# Patient Record
Sex: Male | Born: 1945 | Race: Black or African American | Hispanic: No | Marital: Married | Smoking: Never smoker
Health system: Southern US, Community
[De-identification: ages and names within clinical notes are randomized; demographics above are authoritative.]

## PROBLEM LIST (undated history)

## (undated) DIAGNOSIS — N183 Chronic kidney disease, stage 3 unspecified: Secondary | ICD-10-CM

## (undated) DIAGNOSIS — E559 Vitamin D deficiency, unspecified: Secondary | ICD-10-CM

## (undated) DIAGNOSIS — I4891 Unspecified atrial fibrillation: Secondary | ICD-10-CM

## (undated) DIAGNOSIS — F32A Depression, unspecified: Secondary | ICD-10-CM

## (undated) DIAGNOSIS — M1990 Unspecified osteoarthritis, unspecified site: Secondary | ICD-10-CM

## (undated) DIAGNOSIS — E669 Obesity, unspecified: Secondary | ICD-10-CM

## (undated) DIAGNOSIS — E785 Hyperlipidemia, unspecified: Secondary | ICD-10-CM

## (undated) DIAGNOSIS — D649 Anemia, unspecified: Secondary | ICD-10-CM

## (undated) DIAGNOSIS — I2510 Atherosclerotic heart disease of native coronary artery without angina pectoris: Secondary | ICD-10-CM

## (undated) DIAGNOSIS — I10 Essential (primary) hypertension: Secondary | ICD-10-CM

## (undated) DIAGNOSIS — D509 Iron deficiency anemia, unspecified: Secondary | ICD-10-CM

## (undated) DIAGNOSIS — N400 Enlarged prostate without lower urinary tract symptoms: Secondary | ICD-10-CM

## (undated) DIAGNOSIS — J189 Pneumonia, unspecified organism: Secondary | ICD-10-CM

## (undated) DIAGNOSIS — R911 Solitary pulmonary nodule: Secondary | ICD-10-CM

## (undated) DIAGNOSIS — I714 Abdominal aortic aneurysm, without rupture, unspecified: Secondary | ICD-10-CM

## (undated) DIAGNOSIS — K635 Polyp of colon: Secondary | ICD-10-CM

## (undated) DIAGNOSIS — K219 Gastro-esophageal reflux disease without esophagitis: Secondary | ICD-10-CM

## (undated) DIAGNOSIS — J869 Pyothorax without fistula: Secondary | ICD-10-CM

## (undated) DIAGNOSIS — F1411 Cocaine abuse, in remission: Secondary | ICD-10-CM

## (undated) DIAGNOSIS — I4510 Unspecified right bundle-branch block: Secondary | ICD-10-CM

## (undated) DIAGNOSIS — G4730 Sleep apnea, unspecified: Secondary | ICD-10-CM

## (undated) DIAGNOSIS — R51 Headache: Secondary | ICD-10-CM

## (undated) DIAGNOSIS — N492 Inflammatory disorders of scrotum: Secondary | ICD-10-CM

## (undated) DIAGNOSIS — K5120 Ulcerative (chronic) proctitis without complications: Secondary | ICD-10-CM

## (undated) DIAGNOSIS — C3490 Malignant neoplasm of unspecified part of unspecified bronchus or lung: Secondary | ICD-10-CM

## (undated) DIAGNOSIS — I219 Acute myocardial infarction, unspecified: Secondary | ICD-10-CM

## (undated) DIAGNOSIS — I471 Supraventricular tachycardia: Secondary | ICD-10-CM

## (undated) DIAGNOSIS — I444 Left anterior fascicular block: Secondary | ICD-10-CM

## (undated) DIAGNOSIS — K760 Fatty (change of) liver, not elsewhere classified: Secondary | ICD-10-CM

## (undated) DIAGNOSIS — R296 Repeated falls: Secondary | ICD-10-CM

## (undated) DIAGNOSIS — Z972 Presence of dental prosthetic device (complete) (partial): Secondary | ICD-10-CM

## (undated) DIAGNOSIS — F329 Major depressive disorder, single episode, unspecified: Secondary | ICD-10-CM

## (undated) DIAGNOSIS — K5909 Other constipation: Secondary | ICD-10-CM

## (undated) DIAGNOSIS — E039 Hypothyroidism, unspecified: Secondary | ICD-10-CM

## (undated) DIAGNOSIS — F0781 Postconcussional syndrome: Secondary | ICD-10-CM

## (undated) HISTORY — DX: Supraventricular tachycardia: I47.1

## (undated) HISTORY — PX: MULTIPLE TOOTH EXTRACTIONS: SHX2053

## (undated) HISTORY — DX: Solitary pulmonary nodule: R91.1

## (undated) HISTORY — DX: Pyothorax without fistula: J86.9

## (undated) HISTORY — PX: OTHER SURGICAL HISTORY: SHX169

## (undated) HISTORY — PX: CARDIAC SURGERY: SHX584

## (undated) HISTORY — DX: Hyperlipidemia, unspecified: E78.5

## (undated) HISTORY — DX: Benign prostatic hyperplasia without lower urinary tract symptoms: N40.0

## (undated) HISTORY — PX: HERNIA REPAIR: SHX51

## (undated) HISTORY — DX: Iron deficiency anemia, unspecified: D50.9

## (undated) HISTORY — PX: HEMORRHOID SURGERY: SHX153

## (undated) HISTORY — DX: Inflammatory disorders of scrotum: N49.2

## (undated) HISTORY — DX: Atherosclerotic heart disease of native coronary artery without angina pectoris: I25.10

## (undated) HISTORY — DX: Cocaine abuse, in remission: F14.11

## (undated) HISTORY — PX: COLONOSCOPY W/ BIOPSIES AND POLYPECTOMY: SHX1376

## (undated) HISTORY — DX: Hypothyroidism, unspecified: E03.9

## (undated) HISTORY — DX: Vitamin D deficiency, unspecified: E55.9

## (undated) HISTORY — DX: Fatty (change of) liver, not elsewhere classified: K76.0

## (undated) HISTORY — PX: TOOTH EXTRACTION: SUR596

---

## 1998-11-21 ENCOUNTER — Encounter: Payer: Self-pay | Admitting: Cardiology

## 1998-11-21 ENCOUNTER — Inpatient Hospital Stay (HOSPITAL_COMMUNITY): Admission: EM | Admit: 1998-11-21 | Discharge: 1998-11-24 | Payer: Self-pay | Admitting: Emergency Medicine

## 2000-08-24 ENCOUNTER — Emergency Department (HOSPITAL_COMMUNITY): Admission: EM | Admit: 2000-08-24 | Discharge: 2000-08-24 | Payer: Self-pay | Admitting: Emergency Medicine

## 2000-08-27 ENCOUNTER — Encounter: Payer: Self-pay | Admitting: Urology

## 2000-08-27 ENCOUNTER — Ambulatory Visit (HOSPITAL_COMMUNITY): Admission: RE | Admit: 2000-08-27 | Discharge: 2000-08-27 | Payer: Self-pay | Admitting: Urology

## 2000-09-03 ENCOUNTER — Ambulatory Visit (HOSPITAL_COMMUNITY): Admission: RE | Admit: 2000-09-03 | Discharge: 2000-09-03 | Payer: Self-pay | Admitting: *Deleted

## 2000-09-03 ENCOUNTER — Encounter: Payer: Self-pay | Admitting: *Deleted

## 2000-09-10 ENCOUNTER — Encounter: Payer: Self-pay | Admitting: Urology

## 2000-09-17 ENCOUNTER — Inpatient Hospital Stay (HOSPITAL_COMMUNITY): Admission: RE | Admit: 2000-09-17 | Discharge: 2000-09-21 | Payer: Self-pay | Admitting: Urology

## 2000-09-17 ENCOUNTER — Encounter (INDEPENDENT_AMBULATORY_CARE_PROVIDER_SITE_OTHER): Payer: Self-pay | Admitting: Specialist

## 2000-12-03 ENCOUNTER — Encounter: Payer: Self-pay | Admitting: *Deleted

## 2000-12-03 ENCOUNTER — Ambulatory Visit (HOSPITAL_COMMUNITY): Admission: RE | Admit: 2000-12-03 | Discharge: 2000-12-03 | Payer: Self-pay | Admitting: *Deleted

## 2000-12-09 ENCOUNTER — Ambulatory Visit: Admission: RE | Admit: 2000-12-09 | Discharge: 2000-12-09 | Payer: Self-pay | Admitting: *Deleted

## 2000-12-23 ENCOUNTER — Encounter: Payer: Self-pay | Admitting: *Deleted

## 2000-12-23 ENCOUNTER — Ambulatory Visit (HOSPITAL_COMMUNITY): Admission: RE | Admit: 2000-12-23 | Discharge: 2000-12-23 | Payer: Self-pay | Admitting: *Deleted

## 2001-06-27 ENCOUNTER — Encounter: Payer: Self-pay | Admitting: *Deleted

## 2001-06-27 ENCOUNTER — Ambulatory Visit (HOSPITAL_COMMUNITY): Admission: RE | Admit: 2001-06-27 | Discharge: 2001-06-27 | Payer: Self-pay | Admitting: *Deleted

## 2004-08-31 ENCOUNTER — Emergency Department (HOSPITAL_COMMUNITY): Admission: EM | Admit: 2004-08-31 | Discharge: 2004-09-01 | Payer: Self-pay | Admitting: *Deleted

## 2007-08-20 ENCOUNTER — Emergency Department (HOSPITAL_COMMUNITY): Admission: EM | Admit: 2007-08-20 | Discharge: 2007-08-20 | Payer: Self-pay | Admitting: Emergency Medicine

## 2009-01-13 ENCOUNTER — Encounter: Admission: RE | Admit: 2009-01-13 | Discharge: 2009-01-13 | Payer: Self-pay | Admitting: Family Medicine

## 2009-02-15 ENCOUNTER — Encounter: Admission: RE | Admit: 2009-02-15 | Discharge: 2009-05-16 | Payer: Self-pay | Admitting: Family Medicine

## 2009-05-11 ENCOUNTER — Ambulatory Visit: Payer: Self-pay | Admitting: Thoracic Surgery (Cardiothoracic Vascular Surgery)

## 2009-05-11 ENCOUNTER — Inpatient Hospital Stay (HOSPITAL_COMMUNITY): Admission: RE | Admit: 2009-05-11 | Discharge: 2009-05-12 | Payer: Self-pay | Admitting: Interventional Cardiology

## 2009-05-12 ENCOUNTER — Encounter: Payer: Self-pay | Admitting: Cardiothoracic Surgery

## 2009-05-13 ENCOUNTER — Inpatient Hospital Stay (HOSPITAL_COMMUNITY)
Admission: AD | Admit: 2009-05-13 | Discharge: 2009-05-19 | Payer: Self-pay | Admitting: Thoracic Surgery (Cardiothoracic Vascular Surgery)

## 2009-05-13 HISTORY — PX: OTHER SURGICAL HISTORY: SHX169

## 2009-06-06 ENCOUNTER — Ambulatory Visit: Payer: Self-pay | Admitting: Thoracic Surgery (Cardiothoracic Vascular Surgery)

## 2009-06-06 ENCOUNTER — Encounter
Admission: RE | Admit: 2009-06-06 | Discharge: 2009-06-06 | Payer: Self-pay | Admitting: Thoracic Surgery (Cardiothoracic Vascular Surgery)

## 2009-06-24 ENCOUNTER — Ambulatory Visit: Payer: Self-pay | Admitting: Thoracic Surgery (Cardiothoracic Vascular Surgery)

## 2009-09-01 ENCOUNTER — Encounter: Admission: RE | Admit: 2009-09-01 | Discharge: 2009-09-01 | Payer: Self-pay | Admitting: Family Medicine

## 2009-11-16 ENCOUNTER — Emergency Department (HOSPITAL_COMMUNITY): Admission: EM | Admit: 2009-11-16 | Discharge: 2009-11-16 | Payer: Self-pay | Admitting: Emergency Medicine

## 2010-06-24 ENCOUNTER — Inpatient Hospital Stay (HOSPITAL_COMMUNITY)
Admission: EM | Admit: 2010-06-24 | Discharge: 2010-07-04 | Payer: Self-pay | Source: Home / Self Care | Attending: Internal Medicine | Admitting: Internal Medicine

## 2010-06-25 ENCOUNTER — Encounter (INDEPENDENT_AMBULATORY_CARE_PROVIDER_SITE_OTHER): Payer: Self-pay | Admitting: Pulmonary Disease

## 2010-06-26 LAB — TYPE AND SCREEN
ABO/RH(D): A POS
Antibody Screen: NEGATIVE

## 2010-06-26 LAB — URINALYSIS, ROUTINE W REFLEX MICROSCOPIC
Bilirubin Urine: NEGATIVE
Hgb urine dipstick: NEGATIVE
Ketones, ur: NEGATIVE mg/dL
Leukocytes, UA: NEGATIVE
Nitrite: NEGATIVE
Protein, ur: 30 mg/dL — AB
Specific Gravity, Urine: 1.034 — ABNORMAL HIGH (ref 1.005–1.030)
Urine Glucose, Fasting: >1000 mg/dL — AB
Urobilinogen, UA: 1 mg/dL (ref 0.0–1.0)
pH: 5.5 (ref 5.0–8.0)

## 2010-06-26 LAB — GLUCOSE, CAPILLARY
Glucose-Capillary: 428 mg/dL — ABNORMAL HIGH (ref 70–99)
Glucose-Capillary: 104 mg/dL — ABNORMAL HIGH (ref 70–99)
Glucose-Capillary: 112 mg/dL — ABNORMAL HIGH (ref 70–99)
Glucose-Capillary: 118 mg/dL — ABNORMAL HIGH (ref 70–99)
Glucose-Capillary: 126 mg/dL — ABNORMAL HIGH (ref 70–99)
Glucose-Capillary: 126 mg/dL — ABNORMAL HIGH (ref 70–99)
Glucose-Capillary: 132 mg/dL — ABNORMAL HIGH (ref 70–99)
Glucose-Capillary: 135 mg/dL — ABNORMAL HIGH (ref 70–99)
Glucose-Capillary: 198 mg/dL — ABNORMAL HIGH (ref 70–99)
Glucose-Capillary: 250 mg/dL — ABNORMAL HIGH (ref 70–99)
Glucose-Capillary: 275 mg/dL — ABNORMAL HIGH (ref 70–99)
Glucose-Capillary: 311 mg/dL — ABNORMAL HIGH (ref 70–99)
Glucose-Capillary: 337 mg/dL — ABNORMAL HIGH (ref 70–99)

## 2010-06-26 LAB — CBC
HCT: 43.1 % (ref 39.0–52.0)
HCT: 38.8 % — ABNORMAL LOW (ref 39.0–52.0)
Hemoglobin: 15.1 g/dL (ref 13.0–17.0)
Hemoglobin: 13.5 g/dL (ref 13.0–17.0)
MCH: 31.8 pg (ref 26.0–34.0)
MCH: 31.5 pg (ref 26.0–34.0)
MCHC: 35 g/dL (ref 30.0–36.0)
MCHC: 34.8 g/dL (ref 30.0–36.0)
MCV: 90.7 fL (ref 78.0–100.0)
MCV: 90.4 fL (ref 78.0–100.0)
Platelets: 260 10*3/uL (ref 150–400)
Platelets: 226 10*3/uL (ref 150–400)
RBC: 4.75 MIL/uL (ref 4.22–5.81)
RBC: 4.29 MIL/uL (ref 4.22–5.81)
RDW: 13.2 % (ref 11.5–15.5)
RDW: 13.2 % (ref 11.5–15.5)
WBC: 15.4 10*3/uL — ABNORMAL HIGH (ref 4.0–10.5)
WBC: 16.5 10*3/uL — ABNORMAL HIGH (ref 4.0–10.5)

## 2010-06-26 LAB — COMPREHENSIVE METABOLIC PANEL
ALT: 27 U/L (ref 0–53)
AST: 16 U/L (ref 0–37)
Albumin: 2.9 g/dL — ABNORMAL LOW (ref 3.5–5.2)
Alkaline Phosphatase: 44 U/L (ref 39–117)
BUN: 33 mg/dL — ABNORMAL HIGH (ref 6–23)
CO2: 25 mEq/L (ref 19–32)
Calcium: 8.6 mg/dL (ref 8.4–10.5)
Chloride: 95 mEq/L — ABNORMAL LOW (ref 96–112)
Creatinine, Ser: 2.14 mg/dL — ABNORMAL HIGH (ref 0.4–1.5)
GFR calc Af Amer: 38 mL/min — ABNORMAL LOW (ref >60)
GFR calc non Af Amer: 31 mL/min — ABNORMAL LOW (ref >60)
Glucose, Bld: 100 mg/dL — ABNORMAL HIGH (ref 70–99)
Potassium: 3.9 mEq/L (ref 3.5–5.1)
Sodium: 130 mEq/L — ABNORMAL LOW (ref 135–145)
Total Bilirubin: 1.3 mg/dL — ABNORMAL HIGH (ref 0.3–1.2)
Total Protein: 6.9 g/dL (ref 6.0–8.3)

## 2010-06-26 LAB — MRSA PCR SCREENING: MRSA by PCR: NEGATIVE

## 2010-06-26 LAB — DIFFERENTIAL
Basophils Absolute: 0 10*3/uL (ref 0.0–0.1)
Basophils Absolute: 0 10*3/uL (ref 0.0–0.1)
Basophils Relative: 0 % (ref 0–1)
Basophils Relative: 0 % (ref 0–1)
Eosinophils Absolute: 0 10*3/uL (ref 0.0–0.7)
Eosinophils Absolute: 0 10*3/uL (ref 0.0–0.7)
Eosinophils Relative: 0 % (ref 0–5)
Eosinophils Relative: 0 % (ref 0–5)
Lymphocytes Relative: 9 % — ABNORMAL LOW (ref 12–46)
Lymphocytes Relative: 11 % — ABNORMAL LOW (ref 12–46)
Lymphs Abs: 1.4 10*3/uL (ref 0.7–4.0)
Lymphs Abs: 1.8 10*3/uL (ref 0.7–4.0)
Monocytes Absolute: 1.3 10*3/uL — ABNORMAL HIGH (ref 0.1–1.0)
Monocytes Absolute: 2 10*3/uL — ABNORMAL HIGH (ref 0.1–1.0)
Monocytes Relative: 9 % (ref 3–12)
Monocytes Relative: 12 % (ref 3–12)
Neutro Abs: 12.7 10*3/uL — ABNORMAL HIGH (ref 1.7–7.7)
Neutro Abs: 12.7 10*3/uL — ABNORMAL HIGH (ref 1.7–7.7)
Neutrophils Relative %: 82 % — ABNORMAL HIGH (ref 43–77)
Neutrophils Relative %: 77 % (ref 43–77)

## 2010-06-26 LAB — LIPID PANEL
Cholesterol: 132 mg/dL (ref 0–200)
HDL: 22 mg/dL — ABNORMAL LOW (ref >39)
LDL Cholesterol: 87 mg/dL (ref 0–99)
Total CHOL/HDL Ratio: 6 RATIO
Triglycerides: 116 mg/dL (ref <150)
VLDL: 23 mg/dL (ref 0–40)

## 2010-06-26 LAB — GRAM STAIN

## 2010-06-26 LAB — URINE MICROSCOPIC-ADD ON

## 2010-06-26 LAB — CARDIAC PANEL(CRET KIN+CKTOT+MB+TROPI)
CK, MB: 0.6 ng/mL (ref 0.3–4.0)
CK, MB: 1 ng/mL (ref 0.3–4.0)
Relative Index: INVALID (ref 0.0–2.5)
Relative Index: INVALID (ref 0.0–2.5)
Total CK: 35 U/L (ref 7–232)
Total CK: 38 U/L (ref 7–232)
Troponin I: 0.03 ng/mL (ref 0.00–0.06)
Troponin I: 0.03 ng/mL (ref 0.00–0.06)

## 2010-06-26 LAB — PHOSPHORUS: Phosphorus: 2 mg/dL — ABNORMAL LOW (ref 2.3–4.6)

## 2010-06-26 LAB — CORTISOL: Cortisol, Plasma: 42.3 ug/dL

## 2010-06-26 LAB — PROTIME-INR
INR: 1.27 (ref 0.00–1.49)
Prothrombin Time: 16.1 seconds — ABNORMAL HIGH (ref 11.6–15.2)

## 2010-06-26 LAB — PROCALCITONIN: Procalcitonin: 6.89 ng/mL

## 2010-06-26 LAB — HEMOGLOBIN A1C
Hgb A1c MFr Bld: 11.1 % — ABNORMAL HIGH (ref <5.7)
Mean Plasma Glucose: 272 mg/dL — ABNORMAL HIGH (ref <117)

## 2010-06-26 LAB — MAGNESIUM: Magnesium: 1.6 mg/dL (ref 1.5–2.5)

## 2010-06-26 LAB — TSH: TSH: 3.043 u[IU]/mL (ref 0.350–4.500)

## 2010-06-26 LAB — ABO/RH: ABO/RH(D): A POS

## 2010-06-26 LAB — LACTIC ACID, PLASMA: Lactic Acid, Venous: 3.5 mmol/L — ABNORMAL HIGH (ref 0.5–2.2)

## 2010-06-26 LAB — APTT: aPTT: 81 seconds — ABNORMAL HIGH (ref 24–37)

## 2010-06-28 LAB — COMPREHENSIVE METABOLIC PANEL
ALT: 17 U/L (ref 0–53)
AST: 17 U/L (ref 0–37)
Albumin: 2.1 g/dL — ABNORMAL LOW (ref 3.5–5.2)
Alkaline Phosphatase: 34 U/L — ABNORMAL LOW (ref 39–117)
BUN: 35 mg/dL — ABNORMAL HIGH (ref 6–23)
CO2: 20 mEq/L (ref 19–32)
Calcium: 7.5 mg/dL — ABNORMAL LOW (ref 8.4–10.5)
Chloride: 105 mEq/L (ref 96–112)
Creatinine, Ser: 1.72 mg/dL — ABNORMAL HIGH (ref 0.4–1.5)
GFR calc Af Amer: 48 mL/min — ABNORMAL LOW (ref >60)
GFR calc non Af Amer: 40 mL/min — ABNORMAL LOW (ref >60)
Glucose, Bld: 235 mg/dL — ABNORMAL HIGH (ref 70–99)
Potassium: 4.2 mEq/L (ref 3.5–5.1)
Sodium: 133 mEq/L — ABNORMAL LOW (ref 135–145)
Total Bilirubin: 0.8 mg/dL (ref 0.3–1.2)
Total Protein: 5.7 g/dL — ABNORMAL LOW (ref 6.0–8.3)

## 2010-06-28 LAB — CBC
HCT: 32.5 % — ABNORMAL LOW (ref 39.0–52.0)
HCT: 33.3 % — ABNORMAL LOW (ref 39.0–52.0)
Hemoglobin: 10.9 g/dL — ABNORMAL LOW (ref 13.0–17.0)
Hemoglobin: 11.1 g/dL — ABNORMAL LOW (ref 13.0–17.0)
MCH: 30.4 pg (ref 26.0–34.0)
MCH: 30.5 pg (ref 26.0–34.0)
MCHC: 33.5 g/dL (ref 30.0–36.0)
MCHC: 33.3 g/dL (ref 30.0–36.0)
MCV: 90.5 fL (ref 78.0–100.0)
MCV: 91.5 fL (ref 78.0–100.0)
Platelets: 140 10*3/uL — ABNORMAL LOW (ref 150–400)
Platelets: 197 10*3/uL (ref 150–400)
RBC: 3.59 MIL/uL — ABNORMAL LOW (ref 4.22–5.81)
RBC: 3.64 MIL/uL — ABNORMAL LOW (ref 4.22–5.81)
RDW: 13.3 % (ref 11.5–15.5)
RDW: 13.5 % (ref 11.5–15.5)
WBC: 7.6 10*3/uL (ref 4.0–10.5)
WBC: 9.6 10*3/uL (ref 4.0–10.5)

## 2010-06-28 LAB — GLUCOSE, CAPILLARY
Glucose-Capillary: 405 mg/dL — ABNORMAL HIGH (ref 70–99)
Glucose-Capillary: 380 mg/dL — ABNORMAL HIGH (ref 70–99)
Glucose-Capillary: 290 mg/dL — ABNORMAL HIGH (ref 70–99)
Glucose-Capillary: 205 mg/dL — ABNORMAL HIGH (ref 70–99)
Glucose-Capillary: 292 mg/dL — ABNORMAL HIGH (ref 70–99)
Glucose-Capillary: 302 mg/dL — ABNORMAL HIGH (ref 70–99)
Glucose-Capillary: 276 mg/dL — ABNORMAL HIGH (ref 70–99)
Glucose-Capillary: 169 mg/dL — ABNORMAL HIGH (ref 70–99)
Glucose-Capillary: 160 mg/dL — ABNORMAL HIGH (ref 70–99)
Glucose-Capillary: 125 mg/dL — ABNORMAL HIGH (ref 70–99)
Glucose-Capillary: 118 mg/dL — ABNORMAL HIGH (ref 70–99)

## 2010-06-28 LAB — BASIC METABOLIC PANEL
BUN: 33 mg/dL — ABNORMAL HIGH (ref 6–23)
CO2: 21 mEq/L (ref 19–32)
Calcium: 7.5 mg/dL — ABNORMAL LOW (ref 8.4–10.5)
Chloride: 107 mEq/L (ref 96–112)
Creatinine, Ser: 1.62 mg/dL — ABNORMAL HIGH (ref 0.4–1.5)
GFR calc Af Amer: 52 mL/min — ABNORMAL LOW (ref >60)
GFR calc non Af Amer: 43 mL/min — ABNORMAL LOW (ref >60)
Glucose, Bld: 215 mg/dL — ABNORMAL HIGH (ref 70–99)
Potassium: 3.9 mEq/L (ref 3.5–5.1)
Sodium: 135 mEq/L (ref 135–145)

## 2010-06-28 LAB — PHOSPHORUS: Phosphorus: 2.5 mg/dL (ref 2.3–4.6)

## 2010-06-28 LAB — MAGNESIUM: Magnesium: 1.9 mg/dL (ref 1.5–2.5)

## 2010-06-28 LAB — CULTURE, ROUTINE-ABSCESS

## 2010-06-30 NOTE — Consult Note (Signed)
NAME:  Matthew [name], Matthew [name] NO.:  [id number]  MEDICAL RECORD NO.:  [id number]          PATIENT TYPE:  INP  LOCATION:  [place]                         FACILITY:  [facility]  PHYSICIAN:  Marland Kitchen T. Hoxworth, M.D.DATE OF BIRTH:  [birth year]  DATE OF CONSULTATION:  06/25/2010 DATE OF DISCHARGE:                                CONSULTATION   CHIEF COMPLAINT:  Perineal and scrotal pain and swelling.  HISTORY OF PRESENT ILLNESS:  [name] is a 65 year old male who presented to [facility] yesterday complaining of about 6 days of increasing pain, swelling and tenderness in his perineum and scrotum. He states he initially noted a small "pimple" which he localizes in the perineum anterior and to the right of the anus.  He says this has progressively worsened.  The patient was admitted yesterday and started on broad-spectrum antibiotics.  He was evaluated yesterday by Urology and there was no definite evidence of fluctuance or necrotic tissue. Overnight, the patient states that the pain and tenderness has gotten no better.  This morning, he developed some tachycardia and hypotension and his white count is further elevated.  We are asked to see the patient in consultation this morning.  PAST MEDICAL HISTORY/PAST SURGICAL HISTORY:  Significant for history of renal cell carcinoma status post nephrectomy.  He had lung metastases and was treated with interleukin about 8 years ago with complete resolution.  He has had a coronary artery bypass graft a year ago.  He was followed by Dr. Irish Lack.  He has had cataract surgery.  Medically, he has a history of adult-onset diabetes mellitus, coronary artery disease, atrial fibrillation, depression, anxiety, a questionable history of ulcerative colitis, anemia, hiatal hernia.  MEDICATIONS ON ADMISSION:  Aspirin 325 daily, nitroglycerin p.r.n., Crestor 20 daily, iron, Januvia, 50 daily, Levemir, metoprolol 25 three times a day, fish  oil, MiraLax, levothyroxine 100 mcg daily, amiodarone 200 daily, Wellbutrin 300 daily, Fexmid 7.5 three times a day, omeprazole 40 twice daily.  ALLERGIES:  None.  SOCIAL HISTORY:  Married.  Lives with his wife.  Denies cigarettes or alcohol.  FAMILY HISTORY:  Noncontributory.  REVIEW OF SYSTEMS:  GENERAL:  Denies fever or chills with this illness. RESPIRATORY:  Denies shortness of breath, cough, wheezing.  CARDIAC: Denies chest pain, palpitations.  ABDOMEN/GI:  Denies abdominal pain, nausea, vomiting.  He has not had a bowel movement in several days.  GU: No urinary burning, frequency.  PHYSICAL EXAMINATION:  VITAL SIGNS:  This morning in ICU, blood pressure dropped to 86/40, heart rate up to 128, and respirations 20.  He is afebrile.  After fluid boluses, blood pressure back up to 116/50. GENERAL:  He is alert, conversant, oriented male in no severe distress. SKIN:  Generally warm and dry. HEENT:  No mass or thyromegaly.  Sclerae nonicteric.  Oropharynx clear. LYMPH NODES:  No cervical, supraclavicular, inguinal nodes palpable. LUNGS:  Clear without wheezing, increase work of breathing. CARDIAC:  Healed sternotomy.  Regular tachycardia.  No murmurs. ABDOMEN:  Soft, nontender.  No masses or organomegaly. PERINEUM/RECTUM AND GU:  There is fairly marked edema, erythema and swelling of the scrotum.  This extends down  into the perineum all anterior to the rectum.  In the perineum anterior to the right of the rectum is about 1-cm area of fluctuance and pustule without drainage. On the posterior left scrotum is an area of some skin discoloration and questionable fluctuance.  LABORATORY DATA:  This morning, white count was 16.5 which is up from about 15 yesterday, hemoglobin 13.5.  Electrolytes abnormal for sodium of 130.  BUN and creatinine are elevated today at 33 and 2.14. Bilirubin is 1.3, albumin 2.9.  Remainder of LFTs are normal.  CT scan of the abdomen and pelvis personally  reviewed.  This revealed marked edema and stranding of the scrotum and perineum  yesterday without discrete abscess or soft tissue air identified, although concern was raised for possible Fournier gangrene  ASSESSMENT AND PLAN:  The patient is a 65 year old male with multiple medical problems who has evidence of worsening soft tissue infection of the perineum and scrotum consistent with necrotizing soft tissue infection, i.e. Fournier gangrene.  Now today, he has evidence of early sepsis with hypotension and organ dysfunction.  I believe the patient will need emergency incision, drainage and debridement of the perineum and scrotum.  Urology has been consulted.  Dr. Alinda Money saw the patient last night and this morning and feels that his physical exam is somewhat worse.  He will be involved in the operating room as well.  I have discuss this with the patient and wife including significant risks of anesthetic large open wound and prolonged hospital course.     Darene Lamer. Hoxworth, M.D.     Alto Denver  D:  06/25/2010  T:  06/25/2010  Job:  [id number]  Electronically Signed by Excell Seltzer M.D. on 06/30/2010 09:19:54 AM

## 2010-06-30 NOTE — Op Note (Signed)
NAME:  Matthew [name], Matthew [name] NO.:  [id number]  MEDICAL RECORD NO.:  [id number]          PATIENT TYPE:  INP  LOCATION:  [place]                         FACILITY:  [facility]  PHYSICIAN:  Marland Kitchen T. Hoxworth, M.D.DATE OF BIRTH:  1945-07-13  DATE OF PROCEDURE:  06/25/2010 DATE OF DISCHARGE:                              OPERATIVE REPORT   PREOPERATIVE DIAGNOSIS:  Perineal abscess with early necrotizing infection (Fournier's gangrene).  POSTOPERATIVE DIAGNOSIS:  Perineal abscess with early necrotizing infection (Fournier's gangrene).  SURGICAL PROCEDURES:  Incision, drainage and debridement of perineum and scrotum.  CO-SURGEONS: 1. Darene Lamer. Hoxworth, M.D. 2. Raynelle Bring, M.D.  ANESTHESIA:  General.  BRIEF HISTORY:  Patient is a 65 year old male with multiple medical problems including diabetes and previous history of metastatic renal cancer resolved after interleukin treatment.  He has multiple other medical problems.  He presents with a several-day history of increasing redness, swelling and pain at the perineum and scrotum, which apparently began per his history as a small pustule in the perineum.  After several hours of hospitalization, he has definitely worsened with some signs of early sepsis.  On examination this morning, he has area of fluctuance and small pustule formation in the perineum just anterior to the rectum, which does not appear to communicate with the rectum by physical exam or CT and also redness and swelling of the scrotum with probably some early skin necrosis of the posterior scrotum.  With these findings in conjunction with Dr. Alinda Money from urology, we have recommended proceeding to the operating room for incision, drainage and debridement.  The nature of the procedure, its indications, risks of bleeding, infection, anesthetic problems, possible need for further surgery and delayed wound healing were discussed with the patient and his wife  preoperatively.  DESCRIPTION OF OPERATION:  The patient was brought to the operating room and placed in supine position on the operating table and general endotracheal anesthesia was induced.  He was already on broad-spectrum antibiotics.  He was carefully placed in lithotomy position.  The perineum was widely sterilely prepped and draped.  Correct patient and procedure were verified.  Initially, I excised about a 2 x 1 cm area of obviously infected skin in the perineum near the midline just to the right anterior to the anus.  This entered a deep subcutaneous pocket with obvious foul-smelling pus.  Cultures were obtained.  This pocket tracked anteriorly up toward the perineal body and the skin and subcutaneous tissue were more widely opened toward the base of the scrotum.  There was necrotic superficial fascia in the wound that was debrided down to healthy muscle over the perineal body.  This resulted in the cavity of about 5 x 3 x 2 cm.  At this point all the edges of the wound were healthy.  Bleeding tissue and the base was healthy appearing muscle.  Following this, Dr. Alinda Money excised the obviously and the necrotic area of skin overlying the scrotum back to healthy tissue as well.  Hemostasis was obtained with cautery.  The wounds were packed with moist saline gauze.  The patient was taken to recovery room in stable condition.  Darene Lamer. Hoxworth, M.D.     Alto Denver  D:  06/25/2010  T:  06/25/2010  Job:  [id number]  Electronically Signed by Excell Seltzer M.D. on 06/30/2010 09:19:57 AM

## 2010-07-02 ENCOUNTER — Encounter: Payer: Self-pay | Admitting: Thoracic Surgery (Cardiothoracic Vascular Surgery)

## 2010-07-02 NOTE — Op Note (Addendum)
  NAME:  Matthew [name], FRIEDT NO.:  [id number]  MEDICAL RECORD NO.:  [id number]          PATIENT TYPE:  INP  LOCATION:  9622                         FACILITY:  [facility]  PHYSICIAN:  Raynelle Bring, MD      DATE OF BIRTH:  10-11-1945  DATE OF PROCEDURE:  06/25/2010 DATE OF DISCHARGE:                              OPERATIVE REPORT   PREOPERATIVE DIAGNOSIS:  Scrotal/perineal cellulitis with possible early Fournier's gangrene.  POSTOPERATIVE DIAGNOSIS:  Perineal/scrotal abscess and cellulitis.  SURGEONS: 1. Darene Lamer. Hoxworth, M.D. 2. Raynelle Bring, M.D.  DESCRIPTION OF PROCEDURE:  For full details, please see the dictated note by Dr. Excell Seltzer.  I had evaluated [name] last night and had discussed his case further with Dr. Excell Seltzer this morning after re- evaluating him.  He did have progression of his infection on his evaluation this morning with clear fluctuant areas in the perineum and scrotum posteriorly.  In addition, he became hemodynamically unstable this morning with drop in his systolic blood pressure into the 80s and requiring fluid resuscitation.  He was taken to the operating room by Dr. Excell Seltzer, who incised his perineum and purulent material was obtained.  Once he had opened the perineum, the posterior scrotum was examined and a small area from the posterior scrotum was excised in the fluctuant area.  No frank pus was noted from this area, but clear necrotic tissue was noted.  A wider excision of the posterior scrotum was then performed with the space measuring approximately 6 x 5 cm removed.  The underlying tissue appeared to be healthy and bled freely. Bleeding was controlled with electrocautery.  The remainder of the procedure will be dictated by Dr. Excell Seltzer.  Mr. Tulloch will receive local wound care and continued broad-spectrum IV antibiotics with wound and tissue cultures pending.  He will be followed closely and may require further debridement  if necessary.     Raynelle Bring, MD     LB/MEDQ  D:  06/25/2010  T:  06/25/2010  Job:  [id number]  cc:   Darene Lamer. Hoxworth, M.D. [address], [place] [address]  Electronically Signed by Raynelle Bring MD on 07/02/2010 05:28:05 PM

## 2010-07-02 NOTE — Consult Note (Addendum)
NAME:  Matthew [name], Matthew [name] NO.:  [id number]  MEDICAL RECORD NO.:  [id number]          PATIENT TYPE:  EMS  LOCATION:  [place]                           FACILITY:  [facility]  PHYSICIAN:  Raynelle Bring, MD      DATE OF BIRTH:  [birth year]  DATE OF CONSULTATION:  06/25/2010 DATE OF DISCHARGE:                                CONSULTATION   REFERRING PHYSICIAN:  Shanon Rosser, MD  REASON FOR CONSULTATION:  Perineal and scrotal pain.  HISTORY:  Mr. Tangen is a 65 year old gentleman with multiple medical problems including a history of coronary artery disease, atrial fibrillation, diabetes, kidney cancer, ulcerative colitis, hepatitis B, and hypothyroidism.  He presented with a 5-day history of moderate-to- severe pain, which began in his posterior perineum.  He initially noted a "pimple" that popped about 5 days ago and has had worsening pain in the posterior peritoneum, which has now extended more anteriorly and up into the scrotum.  He has denied any subjective fever, nausea, vomiting, or chills.  His blood sugars have been more difficult to control over the last few days.  He denies a prior history of genitourinary infections, hematuria, STDs, or urolithiasis.  PAST MEDICAL HISTORY: 1. Coronary artery disease. 2. Atrial fibrillation. 3. Diabetes. 4. History of renal cell carcinoma, metastatic to the lung, status     post nephrectomy and high-dose interleukin-2 therapy with complete     response. 5. Ulcerative colitis. 6. Hypothyroidism.  PAST SURGICAL HISTORY: 1. CABG. 2. Nephrectomy.  MEDICATIONS: 1. Aspirin. 2. Enalapril. 3. Nitroglycerin. 4. Crestor. 5. Iron. 6. Januvia. 7. Amiodarone. 8. Fexmid. 9. Levemir. 10.Metoprolol. 11.Fish oil. 12.Polyethylene glycol. 13.Levothyroxine. 14.Trixaicin. 15.Wellbutrin. 16.Omeprazole.  ALLERGIES:  TETANUS.  FAMILY HISTORY:  No GU malignancy.  SOCIAL HISTORY:  He lives with his wife.  He denies tobacco or  alcohol use.  REVIEW OF SYSTEMS:  A complete review of systems was performed.  All pertinent positives and negatives are as described in history of present illness.  PHYSICAL EXAMINATION:  VITAL SIGNS:  Temperature 98.7, respirations 20, blood pressure 116/43, pulse 80. CONSTITUTIONAL:  A well-nourished, well-developed, age-appropriate male, in mild distress. HEENT:  Normocephalic, atraumatic. NECK:  Supple without lymphadenopathy. CARDIOVASCULAR:  Regular rate and rhythm. LUNGS:  Clear bilaterally. ABDOMEN:  Soft and nontender. GU:  The patient's scrotum is edematous with mild-to-moderate erythema. There is no crepitus, fluctuance, or drainage noted.  His perineum is noted to be indurated, particularly toward the right posterior aspect of the perineum where the initial inciting portion of the infection is able to be identified. DRE:  He has normal sphincter tone without any rectal masses.  I did not palpate any obvious perirectal abscess, but he does have induration and thickening of the posterior peritoneum, extending toward the right side of the anterior rectum with a normal-appearing left anterior perirectal area. EXTREMITIES:  Nontender and no edema.  LABORATORY DATA:  Glucose 428.  Lactic acid 3.5.  White blood count 15.4, hemoglobin 15.1, platelets 260.  Urinalysis 0-2 white blood cells and rare bacteria.  RADIOLOGIC IMAGING:  I independently reviewed his CT scan of the pelvis, which does demonstrate stranding along the perineum  and scrotum with a reactive hydrocele.  No abscess or air is noted in the pelvis or subcutaneous tissue.  IMPRESSION:  Cellulitis of the perineum and scrotum.  RECOMMENDATIONS:  I would recommend hospital admission and broad- spectrum IV antibiotics with gram positive, gram negative, and anaerobic coverage.  Considering his multiple medical comorbid conditions and uncontrolled diabetes at this time, I do think that this would be best for a  hospitalist admission.  I would then plan to follow the patient in consul and would also recommend general surgery consultation, considering that this was initiated in the perirectal area in the posterior perineum.  I do not see any indication for surgical intervention currently and surgical intervention would depend on his response to antibiotic therapy over the near future.     Raynelle Bring, MD     LB/MEDQ  D:  06/25/2010  T:  06/25/2010  Job:  [id number]  cc:   Shanon Rosser, MD Fax: [phone]  Electronically Signed by Raynelle Bring MD on 07/02/2010 05:27:57 PM

## 2010-07-03 LAB — GLUCOSE, CAPILLARY
Glucose-Capillary: 129 mg/dL — ABNORMAL HIGH (ref 70–99)
Glucose-Capillary: 168 mg/dL — ABNORMAL HIGH (ref 70–99)
Glucose-Capillary: 282 mg/dL — ABNORMAL HIGH (ref 70–99)
Glucose-Capillary: 181 mg/dL — ABNORMAL HIGH (ref 70–99)
Glucose-Capillary: 194 mg/dL — ABNORMAL HIGH (ref 70–99)
Glucose-Capillary: 85 mg/dL (ref 70–99)
Glucose-Capillary: 163 mg/dL — ABNORMAL HIGH (ref 70–99)
Glucose-Capillary: 198 mg/dL — ABNORMAL HIGH (ref 70–99)

## 2010-07-03 LAB — BASIC METABOLIC PANEL
BUN: 19 mg/dL (ref 6–23)
CO2: 28 mEq/L (ref 19–32)
GFR calc non Af Amer: 51 mL/min — ABNORMAL LOW (ref >60)
GFR calc non Af Amer: 54 mL/min — ABNORMAL LOW (ref >60)
GFR calc non Af Amer: 58 mL/min — ABNORMAL LOW (ref >60)
Glucose, Bld: 107 mg/dL — ABNORMAL HIGH (ref 70–99)
Glucose, Bld: 155 mg/dL — ABNORMAL HIGH (ref 70–99)
Potassium: 3.8 mEq/L (ref 3.5–5.1)
Potassium: 3.9 mEq/L (ref 3.5–5.1)
Potassium: 3.9 mEq/L (ref 3.5–5.1)
Sodium: 137 mEq/L (ref 135–145)
Sodium: 140 mEq/L (ref 135–145)

## 2010-07-03 LAB — ANAEROBIC CULTURE

## 2010-07-03 LAB — CBC
HCT: 33.9 % — ABNORMAL LOW (ref 39.0–52.0)
HCT: 32.6 % — ABNORMAL LOW (ref 39.0–52.0)
Hemoglobin: 11 g/dL — ABNORMAL LOW (ref 13.0–17.0)
MCH: 30.5 pg (ref 26.0–34.0)
MCHC: 33 g/dL (ref 30.0–36.0)
MCV: 92.3 fL (ref 78.0–100.0)
Platelets: 233 10*3/uL (ref 150–400)
Platelets: 243 10*3/uL (ref 150–400)
RBC: 3.77 MIL/uL — ABNORMAL LOW (ref 4.22–5.81)
RDW: 13.9 % (ref 11.5–15.5)
RDW: 13.6 % (ref 11.5–15.5)
WBC: 8.1 10*3/uL (ref 4.0–10.5)
WBC: 6.7 10*3/uL (ref 4.0–10.5)

## 2010-07-04 LAB — BASIC METABOLIC PANEL
BUN: 9 mg/dL (ref 6–23)
Chloride: 102 mEq/L (ref 96–112)
Creatinine, Ser: 1.33 mg/dL (ref 0.4–1.5)
GFR calc non Af Amer: 52 mL/min — ABNORMAL LOW (ref >60)
Glucose, Bld: 88 mg/dL (ref 70–99)
Potassium: 3.8 mEq/L (ref 3.5–5.1)
Sodium: 139 mEq/L (ref 135–145)

## 2010-07-04 LAB — CULTURE, BLOOD (ROUTINE X 2)
Culture  Setup Time: 201201150144
Culture  Setup Time: 201201150144
Culture: NO GROWTH
Culture: NO GROWTH

## 2010-07-04 LAB — GLUCOSE, CAPILLARY
Glucose-Capillary: 174 mg/dL — ABNORMAL HIGH (ref 70–99)
Glucose-Capillary: 142 mg/dL — ABNORMAL HIGH (ref 70–99)
Glucose-Capillary: 95 mg/dL (ref 70–99)
Glucose-Capillary: 103 mg/dL — ABNORMAL HIGH (ref 70–99)
Glucose-Capillary: 107 mg/dL — ABNORMAL HIGH (ref 70–99)

## 2010-07-04 LAB — CBC
HCT: 33.8 % — ABNORMAL LOW (ref 39.0–52.0)
Hemoglobin: 11.7 g/dL — ABNORMAL LOW (ref 13.0–17.0)
MCH: 30.3 pg (ref 26.0–34.0)
MCV: 89.9 fL (ref 78.0–100.0)
Platelets: 301 10*3/uL (ref 150–400)
RBC: 3.76 MIL/uL — ABNORMAL LOW (ref 4.22–5.81)
RBC: 3.86 MIL/uL — ABNORMAL LOW (ref 4.22–5.81)

## 2010-07-04 LAB — PROTIME-INR
INR: 1.07 (ref 0.00–1.49)
Prothrombin Time: 14.1 seconds (ref 11.6–15.2)

## 2010-07-05 LAB — BASIC METABOLIC PANEL
Chloride: 102 mEq/L (ref 96–112)
Creatinine, Ser: 1.56 mg/dL — ABNORMAL HIGH (ref 0.4–1.5)
GFR calc Af Amer: 54 mL/min — ABNORMAL LOW (ref >60)
Potassium: 4 mEq/L (ref 3.5–5.1)

## 2010-07-05 LAB — GLUCOSE, CAPILLARY: Glucose-Capillary: 192 mg/dL — ABNORMAL HIGH (ref 70–99)

## 2010-07-08 NOTE — H&P (Signed)
NAME:  Matthew [name], WINKER NO.:  [id number]  MEDICAL RECORD NO.:  [id number]          PATIENT TYPE:  EMS  LOCATION:  [place]                           [facility]ACILITY[facility]  PHYSICIAN:  Farris Has, MDDATE OF BIRTH:  02/28/1946  DATE OF ADMISSION:  06/24/2010 DATE OF DISCHARGE:                             HISTORY & PHYSICAL   PRIMARY CARE PROVIDER:  Leighton Ruff, M.D.  CHIEF COMPLAINT:  Scrotal pain.  The patient is a 65 year old gentleman with a history of diabetes; kidney cancer metastatic to the lungs, currently in remission; hypertension, and hypothyroidism.  The patient has been feeling weak and unwell for the past 3 days with nausea, vomiting, poor p.o. toleration, chills, although no overt fevers.  He had been having worsening pain and swelling that started around his perianal area and then advanced up to his scrotum with scrotal enlargement.  His wife reports that he has a lot of perianal fissures and a lot of cracking on the skin.  He otherwise had no other significant complaints.  He states that few days back, he did have slight chest pains for which he took some nitroglycerin, otherwise no other significant complaints.  The only thing the patient did mention is that he has had some cough for the past few days, which was nonproductive and elevated blood sugars for which he tried to increase his dose of Levemir.  REVIEW OF SYSTEMS:  Otherwise negative except for H and P.  PAST MEDICAL HISTORY:  Significant for: 1. Diabetes. 2. Hypertension. 3. Coronary artery disease, status post CABG 1 year ago. 4. History of kidney disease, status post nephrectomy with metastases     to the lungs.  Currently, the patient's family is stating that this     is in remission. 5. History of atrial fibrillation. 6. Depression. 7. Anxiety. 8. History of ulcerative colitis. 9. Bilateral cataracts. 10.History of anemia. 11.Hiatal hernia. 12.History of pulmonary  nodules.  SOCIAL HISTORY:  The patient never smokes or drinks, does not abuse drugs, has been married for 27 years.  FAMILY HISTORY:  Significant for father with coronary artery disease.  ALLERGIES:  None.  MEDICATIONS: 1. Aspirin 325 mg daily. 2. Nitroglycerin as needed. 3. Crestor 20 mg daily. 4. Iron twice a day. 5. Januvia 50 mg a day. 6. Levemir, he has been lately using 80 units in an attempt to control     his high blood sugars. 7. Metoprolol 25 mg 3 times a day. 8. Fish oil 1200 mg. 9. MiraLax as needed. 10.Levothyroxine 100 mcg daily. 11.Trixaicin cream as needed. 12.Amiodarone 200 mg a day. 13.Wellbutrin 300 mg a day. 14.Fexmid 7.5 mg 3 times a day. 15.Omeprazole 40 mg twice a day.  VITALS:  Temperature 98.3; blood pressure 96/54, now up to 116/43; pulse 80, respirations 20, satting 95% on room air. GENERAL:  The patient appears to be currently in no acute distress, requesting something to drink, somewhat somnolent though. HEENT:  Head nontraumatic.  Dry mucous membranes.  Decreased skin turgor. LUNGS:  Clear to auscultation bilaterally. HEART:  Regular rate and rhythm.  No murmurs appreciated. ABDOMEN:  Soft, nontender, nondistended. LOWER EXTREMITY:  No clubbing, cyanosis, or edema. GENITOURINARY:  Scrotum is enlarged; it was thickened, warm, and red. SKIN:  There is also some perineal warmth and induration, no fluctuation noted.  LABORATORY DATA:  White blood cell count 15.4, hemoglobin 15.1.  No chemistries were obtained.  Lactic acid 3.5.  Procalcitonin level 6.8. UA is unremarkable.  CT scan showing hydrocele, which was thought to be reactive and scrotal thickening, early fascitis cannot be excluded as well as Fournier gangrene could not be completely excluded, but no evidence of gas, and currently more likely this is cellulitis.  ASSESSMENT AND PLAN: 1. The patient is a 65 year old gentleman with cellulitis of perineum     and scrotum in a diabetic  patient with history of ulcerative     colitis.  We will start him on broad-spectrum antibiotics of Zosyn     and vancomycin.  Urology has seen him and reviewed his CT scan, at     this point they have told the ER physician that there is no     indication for immediate surgery.  They will put down a consult on     the chart in the near future.  They did recommend General Surgery     consult because they are worried about perirectal involvement.     Given low blood pressures when he first came in and somewhat     elevated lactic acid, we will put on stepdown, give IV fluids, and     monitor vitals closely. 2. Diabetes mellitus with poor control with blood sugars into 400, we     will start him on glucometer for now.  Hold his home diabetes     medications at this point. 3. History of atrial fibrillation, we will obtain an EKG for baseline.     We will continue to monitor on telemetry.  Continue his amiodarone     and decrease dose of metoprolol given recent hypertension with     holding parameters.  If needed, we may need to stop altogether, but     at this point his blood pressures have come up. 4. Low thyroid, we will continue with levothyroxine. 5. History of coronary artery disease.  The patient did have an     isolated chest pain a couple of days ago.  He is     completely chest pain free at this point.  He will just get one set     of cardiac markers and obtain EKG. 6. Prophylaxis, Protonix and heparin subcutaneously. 7. Code status, the patient is full code.     Farris Has, MD     AVD/MEDQ  D:  06/25/2010  T:  06/25/2010  Job:  [id number]  cc:   Leighton Ruff, M.D. Fax: [phone]  Electronically Signed by Toy Baker MD on 07/08/2010 06:23:25 AM

## 2010-07-10 ENCOUNTER — Emergency Department (HOSPITAL_COMMUNITY)
Admission: EM | Admit: 2010-07-10 | Discharge: 2010-07-10 | Payer: Self-pay | Source: Home / Self Care | Admitting: Emergency Medicine

## 2010-07-10 LAB — COMPREHENSIVE METABOLIC PANEL
ALT: 43 U/L (ref 0–53)
Alkaline Phosphatase: 37 U/L — ABNORMAL LOW (ref 39–117)
CO2: 22 mEq/L (ref 19–32)
Calcium: 9.1 mg/dL (ref 8.4–10.5)
Chloride: 103 mEq/L (ref 96–112)
GFR calc non Af Amer: 44 mL/min — ABNORMAL LOW (ref >60)
Glucose, Bld: 179 mg/dL — ABNORMAL HIGH (ref 70–99)
Sodium: 133 mEq/L — ABNORMAL LOW (ref 135–145)
Total Bilirubin: 0.7 mg/dL (ref 0.3–1.2)

## 2010-07-10 LAB — DIFFERENTIAL
Basophils Absolute: 0.1 10*3/uL (ref 0.0–0.1)
Basophils Relative: 1 % (ref 0–1)
Eosinophils Absolute: 0.1 10*3/uL (ref 0.0–0.7)
Lymphs Abs: 1.3 10*3/uL (ref 0.7–4.0)
Neutrophils Relative %: 69 % (ref 43–77)

## 2010-07-10 LAB — CBC
Platelets: 328 10*3/uL (ref 150–400)
RBC: 4.23 MIL/uL (ref 4.22–5.81)
RDW: 14.3 % (ref 11.5–15.5)
WBC: 6.8 10*3/uL (ref 4.0–10.5)

## 2010-07-12 NOTE — Discharge Summary (Signed)
NAME:  Matthew [name], Matthew [name] NO.:  [id number]  MEDICAL RECORD NO.:  [id number]          PATIENT TYPE:  INP  LOCATION:  1638                         FACILITY:  [facility]  PHYSICIAN:  Nat Math, MD      DATE OF BIRTH:  Apr 07, 1946  DATE OF ADMISSION:  06/24/2010 DATE OF DISCHARGE:  07/04/2010                        DISCHARGE SUMMARY - REFERRING   PRIMARY CARE PHYSICIAN:  Leighton Ruff, M.D.  CONSULTING PHYSICIANS: 1. Raynelle Bring, M.D. 2. Marland Kitchen T. Hoxworth, M.D.  DISCHARGE DIAGNOSES: 1. Perineal/scrotal abscess and cellulitis secondary to Streptococcus     group C, status post incision and drainage. 2. Diabetes mellitus type 2. 3. Hypertension. 4. Coronary artery disease, status post coronary artery bypass     grafting. 5. Atrial fibrillation, on amiodarone, patient not anticoagulated. 6. Depression, anxiety disorder. 7. Anemia of chronic disease. 8. History of ulcerative colitis. 9. History of kidney disease, status post nephrectomy. 10.Hiatal hernia. 11.History of pulmonary nodules.  DISCHARGE MEDICATIONS: 1. Benadryl cream apply to left groin twice daily as needed. 2. Avelox 400 mg daily for 5 days. 3. Ultram 25 mg every 6 hours as needed. 4. Amiodarone 200 mg daily. 5. Analpram 2.5-1% apply rectally twice daily as needed. 6. Aspirin 325 mg daily. 7. Crestor 20 mg at bedtime. 8. Ferrous sulfate 160 mg twice daily. 9. Fexmid 7.5 mg 3 times daily. 10.Fish oil 400 mg daily. 11.Januvia 50 mg daily. 12.Levemir 80 units subcutaneous daily. 13.Synthroid 100 mcg daily. 14.Lopressor 25 mg 3 times daily. 15.MiraLax 17 g daily as needed. 16.Nitroglycerin 0.4 mg sublingually every 5 minutes p.r.n. 17.Prilosec 40 mg twice daily. 18.Vitamin D3 OTC 1 capsule twice daily. 19.Wellbutrin 300 mg daily.  PROCEDURES PERFORMED: 1. Status post incision and drainage of a scrotal/perineal abscess on     June 25, 2010, by Dr. Raynelle Bring and Excell Seltzer. 2. Irrigation and debridement of scrotal wound and closure of scrotal     wound on July 03, 2010, by Dr. Raynelle Bring. 3. CT of the pelvis without contrast on June 25, 2010, showed right     hydrocele and scrotal thickening with stranding extending from the     scrotum into the perineum.  No abscess or gas in the soft tissues     noted with concern for early fasciitis/Fournier gangrene. 4. Multiple chest x-rays showing prior CABG, no acute abnormalities.  HOSPITAL COURSE:  This extremely pleasant 65 year old male with comorbidities as noted above came to the hospital on June 25, 2010, with complaints of scrotal pain.  He had also been feeling unwell and sick with some nausea, vomiting, poor oral intake, and chills.  He was eventually found to have Streptococcal group C scrotal abscess requiring incision and drainage and eventually irrigation and debridement of scrotal wound and closure of the scrotal wound.  The patient was initially placed on broad-spectrum antibiotics including Zosyn and vancomycin.  He will be discharged on Avelox to complete 5 more days. The patient feels better today.  He is hemodynamically stable.  Issues include slightly elevated creatinine of 1.56.  His baseline creatinine in this hospital stay was as low as 1.37.  However, when he came  in, he was in acute on chronic renal failure with a BUN of 33 and creatinine 2.14.  There might be an element of antibiotics.  This will need close monitoring given that the patient is status post nephrectomy of 1 kidney.  Otherwise, the patient has done well.  His WBC on July 03, 2010, was 6.5; hemoglobin 11.7; hematocrit 35; and platelet count 321,000.  Blood cultures were negative.  Other labs also included hemoglobin A1c 11.1, normal TSH.  Lipid panel showing total cholesterol 132, HDL 22, LDL 87.  The patient is being discharged in stable condition.  He should follow with Dr. Leighton Ruff as well as  Dr. Raynelle Bring in the next week, also especially to follow renal function.  The time spent for this discharge preparation is less than 30 minutes.     Nat Math, MD     SR/MEDQ  D:  07/04/2010  T:  07/04/2010  Job:  [id number]  cc:   Dr. Azucena Cecil, MD Fax: [phone]  Darene Lamer. Hoxworth, M.D. [address], [place] [address]  Electronically Signed by Nat Math  on 07/12/2010 08:38:11 AM

## 2010-07-15 NOTE — Op Note (Signed)
  NAME:  KUSHAL, SAUNDERS NO.:  [id number]  MEDICAL RECORD NO.:  [id number]          PATIENT TYPE:  INP  LOCATION:  3235                         FACILITY:  [facility]  PHYSICIAN:  Raynelle Bring, MD      DATE OF BIRTH:  06/18/1945  DATE OF PROCEDURE:  07/03/2010 DATE OF DISCHARGE:                              OPERATIVE REPORT   PREOPERATIVE DIAGNOSIS:  Scrotal abscess/wound.  POSTOPERATIVE DIAGNOSIS:  Scrotal abscess/wound.  PROCEDURE:  Irrigation and debridement of scrotal wound and closure of scrotal wound.  SURGEON:  Raynelle Bring, MD  ANESTHESIA:  General endotracheal.  COMPLICATIONS:  None.  INDICATIONS FOR PROCEDURE:  Mr. Cragle is a 65 year old gentleman who presented approximately 10 days ago with a perineal abscess with some extension up into his scrotum.  At that time, he underwent incision and drainage as well as debridement by General Surgery and Urology.  At that time, a portion of necrotic tissue on the posterior scrotum was excised. He has been undergoing local wound care and has been on antibiotic therapy for over 1 week.  He was found to have some fibrinous material developing on the scrotum but no evidence of further infection.  He has remained medically stable and is felt to be appropriate to come down to the operating room for irrigation and debridement and possible closure of his wound.  The potential risks, benefits, and potential complications and alternative options have been discussed with the patient and informed consent was obtained.  DESCRIPTION OF PROCEDURE:  The patient was taken to the operating room and a general anesthetic was administered.  He was given preoperative antibiotics, placed in the dorsal lithotomy position, prepped and draped in the usual sterile fashion.  He has been maintained on IV antibiotic therapy to cover his wound culture obtained during his initial surgery. His scrotum was then retracted upwards with silk  holding sutures.  The wound was examined and the overlying fibrinous material was debrided sharply from the scrotum until healthy bleeding tissue edges were identified.  Hemostasis was achieved and pulse lavage was then performed of the scrotum.  In addition, double antibiotic irrigation was utilized and it was felt that the wound was appropriate for closure.  Therefore, loosely placed interrupted 3-0 chromic sutures were placed.  Towards the upper aspect of the wound, an Iodoform gauze was placed within the wound.  The perineal wound was also examined and appeared to have healthy, pink granulation tissue without evidence of further infection. This was also examined intraoperatively by Dr. Donne Hazel with General Surgery who felt that continued healing by secondary intention would be appropriate.  Therefore, this wound was packed with a wet-to-dry gauze dressing and a scrotal fluff dressing was applied.  The patient tolerated the procedure well and without complications.  He was able to be awakened and transferred to the recovery unit in satisfactory condition.     Raynelle Bring, MD     LB/MEDQ  D:  07/03/2010  T:  07/03/2010  Job:  [id number]  Electronically Signed by Raynelle Bring MD on 07/15/2010 25:42:70 AM

## 2010-08-01 ENCOUNTER — Other Ambulatory Visit: Payer: Self-pay | Admitting: Gastroenterology

## 2010-08-04 DIAGNOSIS — Z0279 Encounter for issue of other medical certificate: Secondary | ICD-10-CM

## 2010-08-14 ENCOUNTER — Ambulatory Visit
Admission: RE | Admit: 2010-08-14 | Discharge: 2010-08-14 | Disposition: A | Discharge status: Home / Self Care | Payer: Medicare Other | Source: Ambulatory Visit | Attending: Gastroenterology | Admitting: Gastroenterology

## 2010-08-14 MED ORDER — IOHEXOL 300 MG/ML  SOLN
50.0000 mL | Freq: Once | INTRAMUSCULAR | Status: AC | PRN
  Administered 2010-08-14: 50 mL via INTRAVENOUS

## 2010-08-28 LAB — POCT I-STAT, CHEM 8
BUN: 21 mg/dL (ref 6–23)
Chloride: 99 mEq/L (ref 96–112)
Creatinine, Ser: 1.7 mg/dL — ABNORMAL HIGH (ref 0.4–1.5)
Hemoglobin: 13.9 g/dL (ref 13.0–17.0)
Potassium: 4.4 mEq/L (ref 3.5–5.1)
Sodium: 133 mEq/L — ABNORMAL LOW (ref 135–145)

## 2010-09-12 LAB — BASIC METABOLIC PANEL
BUN: 14 mg/dL (ref 6–23)
BUN: 14 mg/dL (ref 6–23)
BUN: 15 mg/dL (ref 6–23)
BUN: 9 mg/dL (ref 6–23)
CO2: 23 mEq/L (ref 19–32)
CO2: 27 mEq/L (ref 19–32)
Calcium: 8.9 mg/dL (ref 8.4–10.5)
Calcium: 9 mg/dL (ref 8.4–10.5)
Chloride: 101 mEq/L (ref 96–112)
Chloride: 105 mEq/L (ref 96–112)
Chloride: 106 mEq/L (ref 96–112)
Creatinine, Ser: 1.27 mg/dL (ref 0.4–1.5)
Creatinine, Ser: 1.39 mg/dL (ref 0.4–1.5)
Creatinine, Ser: 1.45 mg/dL (ref 0.4–1.5)
GFR calc Af Amer: 58 mL/min — ABNORMAL LOW (ref >60)
GFR calc Af Amer: >60 mL/min (ref >60)
GFR calc non Af Amer: 48 mL/min — ABNORMAL LOW (ref >60)
GFR calc non Af Amer: 49 mL/min — ABNORMAL LOW (ref >60)
GFR calc non Af Amer: 51 mL/min — ABNORMAL LOW (ref >60)
Glucose, Bld: 120 mg/dL — ABNORMAL HIGH (ref 70–99)
Glucose, Bld: 129 mg/dL — ABNORMAL HIGH (ref 70–99)
Glucose, Bld: 153 mg/dL — ABNORMAL HIGH (ref 70–99)
Potassium: 3.6 mEq/L (ref 3.5–5.1)
Potassium: 3.7 mEq/L (ref 3.5–5.1)
Potassium: 3.9 mEq/L (ref 3.5–5.1)
Potassium: 4.4 mEq/L (ref 3.5–5.1)
Sodium: 137 mEq/L (ref 135–145)
Sodium: 141 mEq/L (ref 135–145)

## 2010-09-12 LAB — BLOOD GAS, ARTERIAL
Acid-Base Excess: 5.1 mmol/L — ABNORMAL HIGH (ref 0.0–2.0)
Bicarbonate: 28.8 mEq/L — ABNORMAL HIGH (ref 20.0–24.0)
Drawn by: 277331
pCO2 arterial: 40.3 mmHg (ref 35.0–45.0)
pO2, Arterial: 88.1 mmHg (ref 80.0–100.0)

## 2010-09-12 LAB — POCT I-STAT 3, ART BLOOD GAS (G3+)
Acid-base deficit: 1 mmol/L (ref 0.0–2.0)
Acid-base deficit: 2 mmol/L (ref 0.0–2.0)
Bicarbonate: 20.9 mEq/L (ref 20.0–24.0)
Bicarbonate: 22.1 mEq/L (ref 20.0–24.0)
O2 Saturation: 100 %
O2 Saturation: 99 %
Patient temperature: 35.4
Patient temperature: 37.3
TCO2: 22 mmol/L (ref 0–100)
TCO2: 25 mmol/L (ref 0–100)
pCO2 arterial: 37.2 mmHg (ref 35.0–45.0)
pH, Arterial: 7.359 (ref 7.350–7.450)
pH, Arterial: 7.361 (ref 7.350–7.450)
pH, Arterial: 7.384 (ref 7.350–7.450)
pO2, Arterial: 110 mmHg — ABNORMAL HIGH (ref 80.0–100.0)
pO2, Arterial: 75 mmHg — ABNORMAL LOW (ref 80.0–100.0)

## 2010-09-12 LAB — POCT I-STAT, CHEM 8
BUN: 8 mg/dL (ref 6–23)
Calcium, Ion: 1.22 mmol/L (ref 1.12–1.32)
Chloride: 106 mEq/L (ref 96–112)
HCT: 30 % — ABNORMAL LOW (ref 39.0–52.0)
HCT: 35 % — ABNORMAL LOW (ref 39.0–52.0)
Hemoglobin: 11.9 g/dL — ABNORMAL LOW (ref 13.0–17.0)
Potassium: 4.8 mEq/L (ref 3.5–5.1)
Potassium: 5.2 mEq/L — ABNORMAL HIGH (ref 3.5–5.1)
Sodium: 138 mEq/L (ref 135–145)
Sodium: 138 mEq/L (ref 135–145)
TCO2: 27 mmol/L (ref 0–100)

## 2010-09-12 LAB — GLUCOSE, CAPILLARY
Glucose-Capillary: 103 mg/dL — ABNORMAL HIGH (ref 70–99)
Glucose-Capillary: 109 mg/dL — ABNORMAL HIGH (ref 70–99)
Glucose-Capillary: 111 mg/dL — ABNORMAL HIGH (ref 70–99)
Glucose-Capillary: 120 mg/dL — ABNORMAL HIGH (ref 70–99)
Glucose-Capillary: 132 mg/dL — ABNORMAL HIGH (ref 70–99)
Glucose-Capillary: 141 mg/dL — ABNORMAL HIGH (ref 70–99)
Glucose-Capillary: 142 mg/dL — ABNORMAL HIGH (ref 70–99)
Glucose-Capillary: 147 mg/dL — ABNORMAL HIGH (ref 70–99)
Glucose-Capillary: 155 mg/dL — ABNORMAL HIGH (ref 70–99)
Glucose-Capillary: 156 mg/dL — ABNORMAL HIGH (ref 70–99)
Glucose-Capillary: 157 mg/dL — ABNORMAL HIGH (ref 70–99)
Glucose-Capillary: 163 mg/dL — ABNORMAL HIGH (ref 70–99)
Glucose-Capillary: 164 mg/dL — ABNORMAL HIGH (ref 70–99)
Glucose-Capillary: 166 mg/dL — ABNORMAL HIGH (ref 70–99)
Glucose-Capillary: 184 mg/dL — ABNORMAL HIGH (ref 70–99)
Glucose-Capillary: 192 mg/dL — ABNORMAL HIGH (ref 70–99)
Glucose-Capillary: 197 mg/dL — ABNORMAL HIGH (ref 70–99)
Glucose-Capillary: 209 mg/dL — ABNORMAL HIGH (ref 70–99)
Glucose-Capillary: 75 mg/dL (ref 70–99)
Glucose-Capillary: 88 mg/dL (ref 70–99)

## 2010-09-12 LAB — CBC
HCT: 29.6 % — ABNORMAL LOW (ref 39.0–52.0)
HCT: 30.1 % — ABNORMAL LOW (ref 39.0–52.0)
Hemoglobin: 11 g/dL — ABNORMAL LOW (ref 13.0–17.0)
Hemoglobin: 11.1 g/dL — ABNORMAL LOW (ref 13.0–17.0)
Hemoglobin: 9.8 g/dL — ABNORMAL LOW (ref 13.0–17.0)
Hemoglobin: 9.9 g/dL — ABNORMAL LOW (ref 13.0–17.0)
MCHC: 33.1 g/dL (ref 30.0–36.0)
MCHC: 33.4 g/dL (ref 30.0–36.0)
MCHC: 33.4 g/dL (ref 30.0–36.0)
MCV: 83.3 fL (ref 78.0–100.0)
MCV: 84.1 fL (ref 78.0–100.0)
MCV: 84.3 fL (ref 78.0–100.0)
Platelets: 201 10*3/uL (ref 150–400)
RBC: 3.55 MIL/uL — ABNORMAL LOW (ref 4.22–5.81)
RBC: 3.57 MIL/uL — ABNORMAL LOW (ref 4.22–5.81)
RBC: 3.95 MIL/uL — ABNORMAL LOW (ref 4.22–5.81)
RBC: 3.98 MIL/uL — ABNORMAL LOW (ref 4.22–5.81)
RBC: 4.32 MIL/uL (ref 4.22–5.81)
RDW: 15.5 % (ref 11.5–15.5)
WBC: 11 10*3/uL — ABNORMAL HIGH (ref 4.0–10.5)
WBC: 12.5 10*3/uL — ABNORMAL HIGH (ref 4.0–10.5)
WBC: 13.1 10*3/uL — ABNORMAL HIGH (ref 4.0–10.5)
WBC: 6.7 10*3/uL (ref 4.0–10.5)
WBC: 9.9 10*3/uL (ref 4.0–10.5)

## 2010-09-12 LAB — TYPE AND SCREEN: Antibody Screen: NEGATIVE

## 2010-09-12 LAB — URINALYSIS, ROUTINE W REFLEX MICROSCOPIC
Hgb urine dipstick: NEGATIVE
Protein, ur: NEGATIVE mg/dL
Urobilinogen, UA: 1 mg/dL (ref 0.0–1.0)

## 2010-09-12 LAB — PLATELET COUNT: Platelets: 181 10*3/uL (ref 150–400)

## 2010-09-12 LAB — APTT: aPTT: 55 seconds — ABNORMAL HIGH (ref 24–37)

## 2010-09-12 LAB — COMPREHENSIVE METABOLIC PANEL
ALT: 17 U/L (ref 0–53)
AST: 19 U/L (ref 0–37)
CO2: 31 mEq/L (ref 19–32)
Calcium: 8.4 mg/dL (ref 8.4–10.5)
GFR calc Af Amer: 56 mL/min — ABNORMAL LOW (ref >60)
Potassium: 3.1 mEq/L — ABNORMAL LOW (ref 3.5–5.1)
Sodium: 137 mEq/L (ref 135–145)
Total Protein: 6.1 g/dL (ref 6.0–8.3)

## 2010-09-12 LAB — POCT I-STAT 4, (NA,K, GLUC, HGB,HCT)
Glucose, Bld: 138 mg/dL — ABNORMAL HIGH (ref 70–99)
Glucose, Bld: 139 mg/dL — ABNORMAL HIGH (ref 70–99)
Glucose, Bld: 182 mg/dL — ABNORMAL HIGH (ref 70–99)
HCT: 26 % — ABNORMAL LOW (ref 39.0–52.0)
HCT: 32 % — ABNORMAL LOW (ref 39.0–52.0)
HCT: 33 % — ABNORMAL LOW (ref 39.0–52.0)
Hemoglobin: 11.2 g/dL — ABNORMAL LOW (ref 13.0–17.0)
Potassium: 3.4 mEq/L — ABNORMAL LOW (ref 3.5–5.1)
Potassium: 3.8 mEq/L (ref 3.5–5.1)
Potassium: 4.3 mEq/L (ref 3.5–5.1)
Sodium: 131 mEq/L — ABNORMAL LOW (ref 135–145)
Sodium: 135 mEq/L (ref 135–145)
Sodium: 138 mEq/L (ref 135–145)
Sodium: 139 mEq/L (ref 135–145)

## 2010-09-12 LAB — CREATININE, SERUM
Creatinine, Ser: 1.48 mg/dL (ref 0.4–1.5)
GFR calc Af Amer: 58 mL/min — ABNORMAL LOW (ref >60)
GFR calc non Af Amer: >60 mL/min (ref >60)

## 2010-09-12 LAB — MAGNESIUM
Magnesium: 2.1 mg/dL (ref 1.5–2.5)
Magnesium: 2.3 mg/dL (ref 1.5–2.5)

## 2010-09-12 LAB — PROTIME-INR
INR: 0.99 (ref 0.00–1.49)
INR: 1.23 (ref 0.00–1.49)

## 2010-09-12 LAB — ABO/RH: ABO/RH(D): A POS

## 2010-09-12 LAB — MRSA CULTURE

## 2010-09-22 LAB — PROTIME-INR

## 2010-10-24 NOTE — Assessment & Plan Note (Signed)
OFFICE VISIT   TALLIN, HART  DOB:  July 11, 1945                                        June 06, 2009  CHART #:  [id number]   The patient is status post coronary artery bypass grafting x4 done by  Dr. Roxan Hockey on May 13, 2009.  The patient was doing well  postoperatively and was initially ready for discharge to home on  May 17, 2009, but continued to have persistent atrial fibrillation.  He was started on IV amiodarone and converted to normal sinus rhythm.  He was eventually switched over to p.o. amiodarone.  He was discharged  to home in sinus rhythm.  He presents today for a 3-week followup visit.  He has seen his primary care physician postoperatively and seems to be  doing well.  He has an appointment to see Dr. Irish Lack in the a.m.  The  patient is without complaints.  He states that he is still taking the  Ambien at night to sleep.  He is up ambulating multiple times per day  without difficulty.  Denies any chest pain, shortness of breath.  He is  tolerating regular diet well.  He states his blood sugars have been very  well controlled.  He denies any drainage or opening from any of his  incision sites.   PHYSICAL EXAMINATION:  VITAL SIGNS:  Blood pressure 116/72, pulse 71,  respirations of 18, O2 sats 97% on room air.  RESPIRATORY:  Clear to auscultation bilaterally.  CARDIAC:  Regular rate and rhythm.  No murmurs noted.  Sternum stable.  ABDOMEN:  Benign.  EXTREMITIES:  No edema noted.  Warm and well perfused.  INCISIONS:  There was a suture removed middle of the patient's sternal  incision.  Post removal of stitch there is noted a small amount of  serous drainage.  There is slight amount of erythema surrounding this  area.  The patient's left chest tube site noted to have eschar.  This  was removed.  There is good granulation tissue underneath.  A sterile  dressing was applied.  The patient's AV sites were noted to be clean,  dry, and intact and healing well.   STUDIES:  The patient had a PA and lateral chest x-ray done today  June 06, 2009, which is noted to be stable.  There is very small  amount of bilateral pleural effusions noted.  No pneumothorax noted.  Sternal wires are intact.   IMPRESSION AND PLAN:  The patient is progressing well postoperatively.  He has an appointment to see Dr. Irish Lack in the a.m.  I did discuss  with the patient undergoing cardiac rehab, but he is adamant against it  and states he is walking enough on his own.  Again it was encouraged but  he denied.  I gave the patient a prescription for Keflex 500 mg t.i.d.  for 10 days prophylactically.  I also wrote prescriptions for the  patient for Ambien and Xanax and told him if he requires any further  prescriptions, he will need to obtain these medications from his primary  care physician.  Plan to bring the patient back in 2 weeks for followup  of his wounds.  At this time, the patient is instructed it is okay for  him to start driving short distances which the patient states he already  has.  He is still instructed no heavy lifting over 10 pounds for another  month.  The patient understands.  The patient is told if he develops any  fevers, drainage from his incisions, areas becomes significantly red, he  is to contact us.  The patient is in agreement.  We will plan to see him  back in 2 weeks for wound check.    Revonda Standard Roxan Hockey, M.D.  Electronically Signed   KMD/MEDQ  D:  06/06/2009  T:  06/07/2009  Job:  [id number]   cc:   Revonda Standard. Roxan Hockey, M.D.  Elsie Stain, MD  Jettie Booze, MD

## 2010-10-24 NOTE — Assessment & Plan Note (Signed)
OFFICE VISIT   Matthew [name], Matthew [name]  DOB:  1946/02/14                                        June 24, 2009  CHART #:  [id number]   HISTORY OF PRESENT ILLNESS:  The patient is a 65 year old gentleman who  had coronary bypass grafting x4 on May 13, 2009.  He did have some  postoperative atrial fibrillation but subsequently were in sinus rhythm  with amiodarone.  He was seen in the office on June 06, 2009, at  which time he was doing well but did have some serous drainage in the  midportion of his sternal incision.  He was treated with empiric Keflex  and now returns for wound check.  He states he has been doing well.  He  has been driving without any problems.  He is not requiring any narcotic  pain medication.   PHYSICAL EXAMINATION:  He is afebrile.  Vital signs are stable.  His  incisions are well healed with no signs of infection.  The sternum is  stable.   IMPRESSION:  The patient is doing well at this point in time.  He will  continue to be followed by Dr. Damita Dunnings and Dr. Irish Lack.  I would be  happy to see him back at anytime if I can be of any further assistance  with his care.   Revonda Standard Roxan Hockey, M.D.  Electronically Signed   SCH/MEDQ  D:  06/24/2009  T:  06/24/2009  Job:  [id number]   cc:   Jettie Booze, MD  Elveria Rising. Damita Dunnings, M.D.

## 2010-10-27 NOTE — Discharge Summary (Signed)
[facility]  Patient:    Matthew [name], Matthew [name]                   MRN: [id number] Adm. Date:  [id number] Disc. Date: [id number] Attending:  Paschal Dopp CC:         Kenn File, M.D.   Discharge Summary  DISCHARGE DIAGNOSES: 1. Renal cell carcinoma of right kidney, pathologic stage T3B NX MX). 2. Coronary artery disease. 3. History of cocaine abuse. 4. Diabetes mellitus type 2.  PROCEDURES:  Right nephrectomy September 17, 2000.  BRIEF HISTORY:  This 65 year old male who recently presented with gross hematuria.  He was found to have a right renal mass.  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  He is going to be treated with immune therapy by Dr. Dema Severin in [place], [place].  Dr. Kenn File here in [place] has consulted on the patient and felt it feasible for the patient to undergo a right nephrectomy as palliation and pretreatment for his immune therapy.  The patient is aware of risk and complications of right nephrectomy and desires to proceed.  PAST MEDICAL HISTORY:  Coronary artery disease.  He is status post PTCA.  He has good cardiac function.  The patient has a history of cocaine abuse and recently has been found to have urine positive for cocaine and benzodiazepines.  This was after the patients diagnosis of renal cell cancer. This is apparently a solitary use of the substances.  History of diabetes mellitus type 2.  MEDICATIONS: 1. Glucotrol 10 mg 1 p.o. q.a.m. 2. Vioxx 25 mg q.d. p.r.n. 3. Lipitor.  ALLERGIES:  No known drug allergies.  SOCIAL HISTORY:  Married.  Does have a history of cocaine abuse but does not use regularly at the present time.  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  Does not use tobacco.  Rarely drinks alcohol.  FAMILY HISTORY:  Noncontributory.  REVIEW OF SYSTEMS:  Noncontributory.  PHYSICAL EXAMINATION:  See admission sheet.  LABORATORY DATA:  Admission studies included a urine  which was positive for red blood cells with no bacteria.  Basic metabolic panel was normal.  Albumin was 3.1.  Hematocrit 25.6%, white count 11,200, platelet count 634,000.  HOSPITAL COURSE:  The patient was admitted to the preadmissions area and received one unit of packed red blood cells prior to induction of anesthesia. He underwent right radical nephrectomy without incident.  His postoperative course was uncomplicated.  He did have three units of blood intraoperatively and postoperatively.  He had no fevers and was advanced to regular diet without problems.  He was discharged home on postoperative day four.  At that time, he was in improved condition, on a regular diet, and his wound was healing well.  He was given instructions upon discharge.  DISCHARGE MEDICATIONS: 1. Tylox 1-2 p.o. q.4h. p.r.n. pain. 2. Iron pills. 3. As well as his usual medications.  FOLLOW-UP:  He will follow up in one week for staple removal.  CONDITION ON DISCHARGE:  He was discharged in improved condition.DD:  10/08/00 TD:  10/08/00 Job: [id number]

## 2010-10-27 NOTE — Op Note (Signed)
[facility]  Patient:    Matthew [name], Matthew [name]                   MRN: [id number] Proc. Date: 09/18/00 Adm. Date:  [id number] Attending:  Paschal Dopp CC:         Kenn File, M.D.   Operative Report  PREOPERATIVE DIAGNOSIS:  Right renal mass.  POSTOPERATIVE DIAGNOSIS:  Right renal mass.  OPERATION PERFORMED:  Right radical nephrectomy.  SURGEON:  Lillette Boxer. Diona Fanti, M.D.  ASSISTANT:  Einar Crow, M.D.  ANESTHESIA:  General endotracheal.  COMPLICATIONS:  None.  INDICATIONS FOR PROCEDURE:  The patient is a 66 year old male with recently diagnosed right renal mass.  The patient had been having gross hematuria for a while.  Evaluation revealed a normal bladder but a large right renal mass.  He had small pulmonary nodules indicative of metastatic disease.  He has been seen by Kenn File, M.D. for medical oncology consultation.  The patient will most likely be entered in an interleukin therapy trial in [place].  He presents at this time for radical nephrectomy for removal of the kidney and palliation of his bleeding.  He is aware of risks and complications of the procedure.  The patient did test positive for cocaine and benzodiazepines in his urine last week.  Prior to this procedure, the morning of operation, he has tested negative.  He has additionally received one unit of packed red blood cells prior to the surgery for treatment of anemia.  DESCRIPTION OF PROCEDURE:  The patient was administered a general anesthetic. He was placed in the supine position on the operating room table with a bump under his right flank.  His anterior abdomen and lower chest were prepped and draped.  An incision was made in the right subcostal region and carried down through the peritoneal layer with electrocautery.  The liver was packed up. The colon was reflected over medially by dividing its peritoneal attachments. It was  quite evident there was a large right renal mass present.  Dissection was carried down medially, with the duodenum kocherized.  The inferior vena cava was identified as well as the right renal vein which was stretched anteriorly by this mass.  The renal vein was circumscribed with a 0 silk suture.  The right renal artery was identified and ligated doubly with Hem-ol-lok clips.  At this point dissection was carried around the kidney circumferentially.  There were small peritoneal attachments that were easily divided with electrocautery.  The gonadal vein and the ureter were identified inferiorly and medially to the kidney.  Both of these were doubly clipped with Hem-o-loks and divided.  At this point with the right renal artery ligated, the renal artery was doubly ligated with 0 silk ties.  The artery was divided. The vein was left undivided, as the right renal vein was quite short and at this point could not accommodate any more ties.  Once the entire kidney had been freed up, with the right adrenal left in situ, the renal vein was divided lateral to the medial most tie.  At this point, three separate silk ties had been placed on the renal vein stump.  The kidney was then passed from the table.  No significant bleeding was seen in the renal fossa at this point.  A small laceration at the inferior aspect of the liver had been cauterized before and was hemostatic.  At this point, the colon was left to lie in the renal  fossa.  The packs were all removed from the abdomen.  The abdomen was closed in two layers using running #1 PDS.  Skin edges were reapproximated using skin clips.  A dry sterile dressing was placed.  The patient tolerated the procedure well.  Estimated blood loss was 800 cc. The patient received one unit of packed red blood cells preoperatively and one intraoperatively.  He was awakened, extubated and taken to the PACU in stable condition. DD:  09/18/00 TD:  09/18/00 Job:  [id number]

## 2010-12-29 ENCOUNTER — Other Ambulatory Visit: Payer: Self-pay | Admitting: Family Medicine

## 2010-12-29 DIAGNOSIS — R2230 Localized swelling, mass and lump, unspecified upper limb: Secondary | ICD-10-CM

## 2011-01-09 ENCOUNTER — Ambulatory Visit
Admission: RE | Admit: 2011-01-09 | Discharge: 2011-01-09 | Disposition: A | Discharge status: Home / Self Care | Payer: Medicare Other | Source: Ambulatory Visit | Attending: Family Medicine | Admitting: Family Medicine

## 2011-01-09 DIAGNOSIS — R2230 Localized swelling, mass and lump, unspecified upper limb: Secondary | ICD-10-CM

## 2011-02-11 ENCOUNTER — Emergency Department (HOSPITAL_COMMUNITY): Payer: 59

## 2011-02-11 ENCOUNTER — Emergency Department (HOSPITAL_COMMUNITY)
Admission: EM | Admit: 2011-02-11 | Discharge: 2011-02-12 | Disposition: A | Discharge status: Home / Self Care | Payer: 59 | Source: Home / Self Care | Attending: Emergency Medicine | Admitting: Emergency Medicine

## 2011-02-11 DIAGNOSIS — I2510 Atherosclerotic heart disease of native coronary artery without angina pectoris: Secondary | ICD-10-CM | POA: Diagnosis present

## 2011-02-11 DIAGNOSIS — K029 Dental caries, unspecified: Secondary | ICD-10-CM | POA: Diagnosis present

## 2011-02-11 DIAGNOSIS — M109 Gout, unspecified: Secondary | ICD-10-CM | POA: Diagnosis present

## 2011-02-11 DIAGNOSIS — I129 Hypertensive chronic kidney disease with stage 1 through stage 4 chronic kidney disease, or unspecified chronic kidney disease: Secondary | ICD-10-CM | POA: Diagnosis present

## 2011-02-11 DIAGNOSIS — I498 Other specified cardiac arrhythmias: Secondary | ICD-10-CM | POA: Diagnosis present

## 2011-02-11 DIAGNOSIS — Z951 Presence of aortocoronary bypass graft: Secondary | ICD-10-CM | POA: Insufficient documentation

## 2011-02-11 DIAGNOSIS — E119 Type 2 diabetes mellitus without complications: Secondary | ICD-10-CM | POA: Insufficient documentation

## 2011-02-11 DIAGNOSIS — I509 Heart failure, unspecified: Secondary | ICD-10-CM | POA: Diagnosis present

## 2011-02-11 DIAGNOSIS — IMO0001 Reserved for inherently not codable concepts without codable children: Secondary | ICD-10-CM | POA: Diagnosis present

## 2011-02-11 DIAGNOSIS — I428 Other cardiomyopathies: Secondary | ICD-10-CM | POA: Diagnosis present

## 2011-02-11 DIAGNOSIS — E871 Hypo-osmolality and hyponatremia: Secondary | ICD-10-CM | POA: Diagnosis present

## 2011-02-11 DIAGNOSIS — E039 Hypothyroidism, unspecified: Secondary | ICD-10-CM | POA: Insufficient documentation

## 2011-02-11 DIAGNOSIS — J869 Pyothorax without fistula: Secondary | ICD-10-CM | POA: Diagnosis not present

## 2011-02-11 DIAGNOSIS — M5430 Sciatica, unspecified side: Secondary | ICD-10-CM | POA: Diagnosis present

## 2011-02-11 DIAGNOSIS — I252 Old myocardial infarction: Secondary | ICD-10-CM

## 2011-02-11 DIAGNOSIS — Z85528 Personal history of other malignant neoplasm of kidney: Secondary | ICD-10-CM

## 2011-02-11 DIAGNOSIS — R109 Unspecified abdominal pain: Secondary | ICD-10-CM | POA: Insufficient documentation

## 2011-02-11 DIAGNOSIS — E785 Hyperlipidemia, unspecified: Secondary | ICD-10-CM | POA: Diagnosis present

## 2011-02-11 DIAGNOSIS — N400 Enlarged prostate without lower urinary tract symptoms: Secondary | ICD-10-CM | POA: Diagnosis present

## 2011-02-11 DIAGNOSIS — N183 Chronic kidney disease, stage 3 unspecified: Secondary | ICD-10-CM | POA: Diagnosis present

## 2011-02-11 DIAGNOSIS — Z794 Long term (current) use of insulin: Secondary | ICD-10-CM

## 2011-02-11 DIAGNOSIS — R079 Chest pain, unspecified: Secondary | ICD-10-CM | POA: Insufficient documentation

## 2011-02-11 DIAGNOSIS — J90 Pleural effusion, not elsewhere classified: Secondary | ICD-10-CM | POA: Diagnosis not present

## 2011-02-11 DIAGNOSIS — N179 Acute kidney failure, unspecified: Secondary | ICD-10-CM | POA: Diagnosis not present

## 2011-02-11 DIAGNOSIS — Z85118 Personal history of other malignant neoplasm of bronchus and lung: Secondary | ICD-10-CM | POA: Insufficient documentation

## 2011-02-11 DIAGNOSIS — I2690 Septic pulmonary embolism without acute cor pulmonale: Secondary | ICD-10-CM | POA: Diagnosis not present

## 2011-02-11 DIAGNOSIS — J189 Pneumonia, unspecified organism: Principal | ICD-10-CM | POA: Diagnosis present

## 2011-02-11 DIAGNOSIS — I5022 Chronic systolic (congestive) heart failure: Secondary | ICD-10-CM | POA: Diagnosis present

## 2011-02-11 DIAGNOSIS — F1410 Cocaine abuse, uncomplicated: Secondary | ICD-10-CM | POA: Diagnosis present

## 2011-02-11 DIAGNOSIS — Z7982 Long term (current) use of aspirin: Secondary | ICD-10-CM

## 2011-02-11 DIAGNOSIS — C7800 Secondary malignant neoplasm of unspecified lung: Secondary | ICD-10-CM | POA: Diagnosis present

## 2011-02-11 DIAGNOSIS — I10 Essential (primary) hypertension: Secondary | ICD-10-CM | POA: Insufficient documentation

## 2011-02-11 LAB — URINE MICROSCOPIC-ADD ON

## 2011-02-11 LAB — PROTIME-INR: Prothrombin Time: 13.8 seconds (ref 11.6–15.2)

## 2011-02-11 LAB — CBC
MCHC: 33.9 g/dL (ref 30.0–36.0)
MCV: 93 fL (ref 78.0–100.0)
Platelets: 226 10*3/uL (ref 150–400)
RDW: 12.6 % (ref 11.5–15.5)
WBC: 9.2 10*3/uL (ref 4.0–10.5)

## 2011-02-11 LAB — COMPREHENSIVE METABOLIC PANEL
AST: 17 U/L (ref 0–37)
Albumin: 3 g/dL — ABNORMAL LOW (ref 3.5–5.2)
BUN: 12 mg/dL (ref 6–23)
Chloride: 96 mEq/L (ref 96–112)
Creatinine, Ser: 1.13 mg/dL (ref 0.50–1.35)
Potassium: 3.9 mEq/L (ref 3.5–5.1)
Total Bilirubin: 0.5 mg/dL (ref 0.3–1.2)
Total Protein: 7.6 g/dL (ref 6.0–8.3)

## 2011-02-11 LAB — URINALYSIS, ROUTINE W REFLEX MICROSCOPIC
Glucose, UA: >1000 mg/dL — AB
Ketones, ur: NEGATIVE mg/dL
Leukocytes, UA: NEGATIVE
Nitrite: NEGATIVE
Specific Gravity, Urine: 1.02 (ref 1.005–1.030)
pH: 6 (ref 5.0–8.0)

## 2011-02-11 LAB — DIFFERENTIAL
Basophils Absolute: 0 10*3/uL (ref 0.0–0.1)
Eosinophils Absolute: 0.2 10*3/uL (ref 0.0–0.7)
Eosinophils Relative: 2 % (ref 0–5)
Monocytes Absolute: 0.9 10*3/uL (ref 0.1–1.0)

## 2011-02-11 LAB — POCT I-STAT TROPONIN I: Troponin i, poc: 0 ng/mL (ref 0.00–0.08)

## 2011-02-11 LAB — LIPASE, BLOOD: Lipase: 14 U/L (ref 11–59)

## 2011-02-12 ENCOUNTER — Emergency Department (HOSPITAL_COMMUNITY): Payer: 59

## 2011-02-12 ENCOUNTER — Encounter (HOSPITAL_COMMUNITY): Payer: Self-pay | Admitting: Radiology

## 2011-02-12 ENCOUNTER — Inpatient Hospital Stay (HOSPITAL_COMMUNITY)
Admission: EM | Admit: 2011-02-12 | Discharge: 2011-02-23 | Disposition: A | Discharge status: Other Acute Inpatient Hospital | Payer: 59 | Source: Home / Self Care | Attending: Internal Medicine | Admitting: Internal Medicine

## 2011-02-12 HISTORY — DX: Essential (primary) hypertension: I10

## 2011-02-12 LAB — BASIC METABOLIC PANEL
BUN: 12 mg/dL (ref 6–23)
CO2: 26 mEq/L (ref 19–32)
Calcium: 9.4 mg/dL (ref 8.4–10.5)
Chloride: 98 mEq/L (ref 96–112)
Creatinine, Ser: 1.37 mg/dL — ABNORMAL HIGH (ref 0.50–1.35)
GFR calc Af Amer: >60 mL/min (ref >60)
GFR calc non Af Amer: 52 mL/min — ABNORMAL LOW (ref >60)
Glucose, Bld: 167 mg/dL — ABNORMAL HIGH (ref 70–99)
Potassium: 4 mEq/L (ref 3.5–5.1)
Sodium: 133 mEq/L — ABNORMAL LOW (ref 135–145)

## 2011-02-12 LAB — DIFFERENTIAL
Basophils Absolute: 0 10*3/uL (ref 0.0–0.1)
Basophils Relative: 0 % (ref 0–1)
Eosinophils Absolute: 0.2 10*3/uL (ref 0.0–0.7)
Eosinophils Relative: 2 % (ref 0–5)
Lymphocytes Relative: 23 % (ref 12–46)
Lymphs Abs: 2.6 10*3/uL (ref 0.7–4.0)
Monocytes Absolute: 1.5 10*3/uL — ABNORMAL HIGH (ref 0.1–1.0)
Monocytes Relative: 13 % — ABNORMAL HIGH (ref 3–12)
Neutro Abs: 7.3 10*3/uL (ref 1.7–7.7)
Neutrophils Relative %: 63 % (ref 43–77)

## 2011-02-12 LAB — GLUCOSE, CAPILLARY: Glucose-Capillary: 305 mg/dL — ABNORMAL HIGH (ref 70–99)

## 2011-02-12 LAB — CBC
HCT: 39.6 % (ref 39.0–52.0)
Hemoglobin: 13.5 g/dL (ref 13.0–17.0)
MCH: 31.7 pg (ref 26.0–34.0)
MCHC: 34.1 g/dL (ref 30.0–36.0)
MCV: 93 fL (ref 78.0–100.0)
Platelets: 276 10*3/uL (ref 150–400)
RBC: 4.26 MIL/uL (ref 4.22–5.81)
RDW: 12.7 % (ref 11.5–15.5)
WBC: 11.6 10*3/uL — ABNORMAL HIGH (ref 4.0–10.5)

## 2011-02-12 LAB — PROTIME-INR
INR: 1.09 (ref 0.00–1.49)
Prothrombin Time: 14.3 seconds (ref 11.6–15.2)

## 2011-02-12 LAB — POCT I-STAT TROPONIN I

## 2011-02-12 MED ORDER — IOHEXOL 300 MG/ML  SOLN
100.0000 mL | Freq: Once | INTRAMUSCULAR | Status: AC | PRN
  Administered 2011-02-12: 100 mL via INTRAVENOUS

## 2011-02-12 NOTE — H&P (Signed)
NAME:  Matthew [name], Matthew [name] NO.:  [id number]  MEDICAL RECORD NO.:  [id number]  LOCATION:  WLED                         FACILITY:  [facility]  PHYSICIAN:  Quintella Baton, MD      DATE OF BIRTH:  Jun 09, 1946  DATE OF ADMISSION:  02/11/2011 DATE OF DISCHARGE:  02/12/2011                             HISTORY & PHYSICAL   PRIMARY CARE PHYSICIAN:  Gavin Pound, MD  CARDIOLOGIST:  Jettie Booze, MD  CODE STATUS:  Full code.  This is a consult from the ER physician Lahoma Rocker, staffed by Dr. Alvino Chapel.  CHIEF COMPLAINT:  Chest pain and abdominal pain.  HISTORY OF PRESENT ILLNESS:  This is a 65 year old male that the hospitalist service was asked to admit for chest pain and abdominal pain.  The patient states that he has been having nausea and vomiting twice in 2 nights, no hememisis. he's had abdominal pains. The pain was dull.  Abdominal pain was constant and aggravating.  He had no problems urinating.  No burning on urination.  It was mostly in the right lower quadrant. He had not had a bowel movement in 3 days.  No evidence of blood in his urine.  He has had a colonoscopy approximately a year ago.  He had polyps, but otherwise normal.  He had an EGD 3 years ago.  He had 2 ulcers in the stomach and 1 in the esophagus.  He was initially on Nexium.  He has had no recurrent studies.  He was also restless and uncomfortable because of pain.  The patient told the ER physician that he had chest pains.  However, during my interview, he denied chest pains.  He only stated that he had chest pains because he wanted to get into the door faster. The ER physician had asked the hospitalist service to consult to rule out for cardiac disease as the patient does have a history of coronary artery disease with CABG.  PAST MEDICAL HISTORY: 1. Hypertension. 2. Diabetes mellitus. 3. History of lung cancer. 4. Hypothyroidism. 5. Kidney cancer. 6. Sciatica.  PAST SURGICAL HISTORY:   CABG and nephrectomy.  MEDICATIONS:  The patient states he does not know, his wife has them.  ALLERGIES:  No known drug allergies.  SOCIAL HISTORY:  Negative tobacco, alcohol, or illicit drugs.  Lives with his wife.  FAMILY HISTORY:  Significant for coronary artery disease.  REVIEW OF SYSTEMS:  All 10-point systems reviewed and negative except as noted in HPI.  PHYSICAL EXAMINATION:  VITAL SIGNS:  Blood pressure 123/72, pulse 76, respirations 20, temperature 97.7. GENERAL:  Alert and oriented male, currently in no acute distress. HEENT:  Eyes, pink conjunctivae.  PERRLA.  ENT, moist oral mucosa. Trachea midline. NECK:  Supple.  No JVD. LUNGS:  Clear to auscultation bilaterally, use of accessory muscles. CARDIOVASCULAR:  Regular rate and rhythm without murmurs, rubs, or gallops. ABDOMEN:  Obese, unable to assess organomegaly.  Soft, positive bowel sounds.  No tenderness elicited. NEURO:  Cranial nerves II-XII grossly intact.  Sensation intact. MUSCULOSKELETAL:  Strength 5/5 in all extremities. EXTREMITIES:  No clubbing, cyanosis, or edema. SKIN:  No rashes.  No subcutaneous crepitation.  LABORATORY DATA:  EKG, normal sinus rhythm with  no acute changes.  INR 1.04.  UA is negative with 0-2 white blood cells and rare bacteria. Lipase 14.  Sodium 133, potassium 3.9, chloride 96, CO2 of 27, glucose 107, BUN 12, creatinine 1.3.  LFTs are normal.  White blood count 9.2, hemoglobin 13.3, platelets 226.  Troponin 0.00.  Chest x-ray, did not develop any new atelectasis or infiltration in the lung base.  KUB, nonobstructive bowel gas pattern.  ASSESSMENT AND PLAN: 1. Abdominal pain.  The patient states that this has now resolved.     Imaging studies are normal. 2. Chest pain. CAD  The patient states that he actually did not have any     chest pain.  Therefore, for this reason, I find no compelling     reason to admit the patient and recommend discharge home. 3.  HTN, DM,  hypothyroidism, stable.         ______________________________ Quintella Baton, MD     DC/MEDQ  D:  02/12/2011  T:  02/12/2011  Job:  [id number]  Electronically Signed by Quintella Baton MD on 02/12/2011 03:59:01 AM

## 2011-02-13 ENCOUNTER — Inpatient Hospital Stay (HOSPITAL_COMMUNITY): Payer: 59

## 2011-02-13 DIAGNOSIS — J189 Pneumonia, unspecified organism: Secondary | ICD-10-CM

## 2011-02-13 LAB — GLUCOSE, CAPILLARY

## 2011-02-13 LAB — CBC
HCT: 36.6 % — ABNORMAL LOW (ref 39.0–52.0)
Hemoglobin: 12 g/dL — ABNORMAL LOW (ref 13.0–17.0)
MCH: 30.8 pg (ref 26.0–34.0)
MCHC: 32.8 g/dL (ref 30.0–36.0)
RDW: 13 % (ref 11.5–15.5)

## 2011-02-13 LAB — COMPREHENSIVE METABOLIC PANEL
Albumin: 2.3 g/dL — ABNORMAL LOW (ref 3.5–5.2)
BUN: 13 mg/dL (ref 6–23)
Calcium: 8.2 mg/dL — ABNORMAL LOW (ref 8.4–10.5)
GFR calc Af Amer: >60 mL/min (ref >60)
Glucose, Bld: 250 mg/dL — ABNORMAL HIGH (ref 70–99)
Total Protein: 6.3 g/dL (ref 6.0–8.3)

## 2011-02-13 LAB — TSH: TSH: 1.054 u[IU]/mL (ref 0.350–4.500)

## 2011-02-13 LAB — EXPECTORATED SPUTUM ASSESSMENT W GRAM STAIN, RFLX TO RESP C

## 2011-02-13 NOTE — H&P (Signed)
NAME:  SANAV, REMER NO.:  [id number]  MEDICAL RECORD NO.:  [id number]  LOCATION:  WLED                         FACILITY:  [facility]  PHYSICIAN:  Jacquelynn Cree, M.D.   DATE OF BIRTH:  1946-02-13  DATE OF ADMISSION:  02/12/2011 DATE OF DISCHARGE:                             HISTORY & PHYSICAL   PRIMARY CARE PHYSICIAN:  Gavin Pound , MD.  CARDIOLOGIST:  Jettie Booze, MD  CHIEF COMPLAINT:  Chest pain.  HISTORY OF PRESENT ILLNESS:  The patient is a 65 year old male who actually presented to the emergency department last night with a chief complaint of chest pain as well as shaking chills, projectile vomiting, dyspnea, and cough productive of green sputum.  He states that his symptoms began approximately 5-6 days ago, he presented to the hospital last night and was recommended for admission for treatment of what appeared to be pneumonia.  However, he ultimately left against medical advice and represented with ongoing significant pain that is pleuritic in nature.  He has taken nitroglycerin at home and this has been unsuccessful at alleviating his pain.  He is accompanied by his wife who provides additional history, stating that the patient has a history of Fournier gangrene and a scrotal abscess that required incision and drainage back in January.  The patient was treated with a course of vancomycin at that time and cultures of the abscess grew Streptococcus group C.  Nevertheless, upon initial evaluation in the emergency department, the patient had a CT scan done of the chest with reportedly shows cavitary pneumonia.  The chest CT is not yet available for my review.  We are asked to admit him for further evaluation and treatment.  PAST MEDICAL HISTORY: 1. Hypertension. 2. Type 2 diabetes. 3. Metastatic renal cell carcinoma, status post right nephrectomy and     treatment with interleukin and interferon. 4. Hypothyroidism. 5. Sciatica. 6. History of  coronary artery disease, status post CABG. 7. Abdominal aortic aneurysm. 8. Hyperlipidemia. 9. Fournier gangrene, status post incision and debridement in January     2012. 10.History of ulcerative colitis. 11.Gout. 12.Benign prostatic hypertrophy.  FAMILY HISTORY:  The patient's mother is alive at age 62 and has Alzheimer disease.  The patient's father died at 88 from pneumonia.  He also had heart disease.  He has one healthy sister and four healthy offspring.  SOCIAL HISTORY:  The patient is married and lives with his wife.  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  There is no history of tobacco, alcohol, or drug use.  ALLERGIES:  No known drug allergies.  CURRENT MEDICATIONS: 1. Fexmid 7.5 mg p.o. t.i.d. p.r.n. muscle spasm. 2. Benadryl cream 0.1% topical b.i.d. p.r.n. 3. Levemir 54 units subcutaneously daily. 4. Analpram E 2.5 - 1% PR b.i.d. p.r.n. hemorrhoids. 5. Amiodarone 200 mg p.o. daily. 6. Aspirin 325 mg p.o. daily. 7. Crestor 20 mg p.o. q.h.s. 8. Ferrous sulfate 160 mg p.o. b.i.d. 9. Flomax 0.4 mg p.o. q.h.s. 10.Gabapentin 300 mg p.o. b.i.d. 11.Januvia 50 mg p.o. daily. 12.Levothyroxine 100 mcg p.o. daily. 13.MiraLax 17 grams p.o. daily p.r.n. constipation. 14.Nitroglycerin 0.4 mg sublingual q.5 minutes p.r.n. chest pain. 15.Omeprazole  40 mg p.o. b.i.d. 16.Wellbutrin XL 300 mg p.o. daily.  REVIEW OF SYSTEMS:  CONSTITUTIONAL:  Positive for shaking chills and fever.  No weight loss.  Appetite has been normal.  HEENT:  Poor dentition.  Otherwise negative.  CARDIOVASCULAR:  Positive for pleuritic- type chest pain.  No dysrhythmia.  RESPIRATORY:  Positive for dyspnea, pleuritic chest pain, and productive cough with green sputum.  GI: Positive for nausea and vomiting.  No diarrhea.  No melena or hematochezia.  GU:  Positive for history of urinary hesitancy.  No dysuria or hematuria.  MUSCULOSKELETAL:  Positive  for myalgias.  Comprehensive 14-point review of systems is as otherwise unremarkable .  PHYSICAL EXAMINATION:  VITAL SIGNS:  Temperature 98.9, pulse 95, respirations 28, and blood pressure 112/55. GENERAL:  Well-developed, well-nourished male in no acute distress. HEENT:  Normocephalic, atraumatic.  PERRL.  EOMI.  Oropharynx reveals moist mucous membranes.  Poor dentition with multiple broken teeth and dental caries visible. NECK:  Supple, no thyromegaly, no lymphadenopathy, no jugular venous distention. CHEST:  Lungs clear to auscultation bilaterally, diminished at the bases. HEART:  Regular rate, rhythm.  No murmurs, rubs, or gallops. ABDOMEN:  Soft, nontender, and nondistended with normoactive bowel sounds. EXTREMITIES:  No clubbing, edema, or cyanosis. SKIN:  Warm and dry.  No rashes. NEUROLOGIC:  The patient is alert and oriented x3.  Cranial nerves II- XII grossly intact.  Nonfocal.  DATA REVIEW: 1. Chest x-ray on February 12, 2011, shows slight progression and     bibasilar airspace disease and small bilateral pleural effusions. 2. CT scan of the chest apparently shows cavitary pneumonia, but is     not available as a formal report.  LABORATORY DATA:  Sodium is 133, potassium 4.0, chloride 98, bicarb 26, BUN 12, creatinine 1.37, glucose 167, and calcium 9.4.  PT is 14.3 with an INR of 1.09.  Troponin is 0.01.  White blood cell count is 11.6, hemoglobin 13.5, hematocrit 39.6, and platelets 276.  ASSESSMENT AND PLAN: 1. Cavitary pneumonia:  We will admit the patient.  We will obtain     blood cultures x2 as well as sputum cultures.  We will empirically     place him on Zosyn and vancomycin. 2. Dental caries with poor dentition:  We will obtain a Panorex and a     dental consultation. 3. Chest pain secondary to cavitary pneumonia:  The patient does     appear to have chest pain from pleuritic causes.  Would not work up     a further cardiac cause given that he has a  cavitary pneumonia. 4. Type 2 diabetes:  Continue the patient's usual home medications and     monitor his blood glucose readings before every meal and at bedtime     and adjust his medicines if needed. 5. Hypothyroidism:  Continue the patient's current dose of Synthroid. 6. Coronary artery disease:  Continue the patient's aspirin therapy. 7. Dyslipidemia:  Continue the patient's statin therapy. 8. Benign prostatic hypertrophy:  Continue the patient's Flomax. 9. Mild hyponatremia:  We will monitor. 10.Stage 3 chronic kidney disease:  The patient does have a reduced     glomerular filtration rate, which is most likely consistent with     stage 3 chronic kidney disease.  His creatinine was 1.58 back in     January 2012, and his current creatinine is 1.37 indicating that     this is chronic. 11.Prophylaxis:  We will initiate Lovenox for deep venous thrombosis  prophylaxis.  Time spent on admission including face-to-face time equals approximately 1 hour.     Jacquelynn Cree, M.D.     CR/MEDQ  D:  02/12/2011  T:  02/12/2011  Job:  [id number]  cc:   Gavin Pound, MD Fax: [phone]  Jettie Booze, MD Fax: [phone]  Electronically Signed by Jacquelynn Cree M.D. on 02/13/2011 05:29:36 PM

## 2011-02-14 DIAGNOSIS — E119 Type 2 diabetes mellitus without complications: Secondary | ICD-10-CM

## 2011-02-14 DIAGNOSIS — J189 Pneumonia, unspecified organism: Secondary | ICD-10-CM

## 2011-02-14 DIAGNOSIS — I10 Essential (primary) hypertension: Secondary | ICD-10-CM

## 2011-02-14 LAB — BASIC METABOLIC PANEL
BUN: 15 mg/dL (ref 6–23)
Chloride: 95 mEq/L — ABNORMAL LOW (ref 96–112)
Creatinine, Ser: 1.52 mg/dL — ABNORMAL HIGH (ref 0.50–1.35)
GFR calc Af Amer: 56 mL/min — ABNORMAL LOW (ref >60)
GFR calc non Af Amer: 46 mL/min — ABNORMAL LOW (ref >60)

## 2011-02-14 LAB — CBC
HCT: 34.4 % — ABNORMAL LOW (ref 39.0–52.0)
MCHC: 32.8 g/dL (ref 30.0–36.0)
MCV: 93.2 fL (ref 78.0–100.0)
RDW: 12.7 % (ref 11.5–15.5)

## 2011-02-14 LAB — GLUCOSE, CAPILLARY
Glucose-Capillary: 163 mg/dL — ABNORMAL HIGH (ref 70–99)
Glucose-Capillary: 119 mg/dL — ABNORMAL HIGH (ref 70–99)
Glucose-Capillary: 169 mg/dL — ABNORMAL HIGH (ref 70–99)

## 2011-02-14 LAB — SEDIMENTATION RATE: Sed Rate: 120 mm/hr — ABNORMAL HIGH (ref 0–16)

## 2011-02-15 LAB — CULTURE, RESPIRATORY W GRAM STAIN

## 2011-02-15 LAB — BASIC METABOLIC PANEL
BUN: 13 mg/dL (ref 6–23)
CO2: 24 mEq/L (ref 19–32)
Chloride: 97 mEq/L (ref 96–112)
Glucose, Bld: 164 mg/dL — ABNORMAL HIGH (ref 70–99)
Potassium: 3.8 mEq/L (ref 3.5–5.1)

## 2011-02-15 LAB — GLUCOSE, CAPILLARY
Glucose-Capillary: 150 mg/dL — ABNORMAL HIGH (ref 70–99)
Glucose-Capillary: 188 mg/dL — ABNORMAL HIGH (ref 70–99)

## 2011-02-15 LAB — CBC
HCT: 35.5 % — ABNORMAL LOW (ref 39.0–52.0)
Hemoglobin: 11.8 g/dL — ABNORMAL LOW (ref 13.0–17.0)
MCHC: 33.2 g/dL (ref 30.0–36.0)
RBC: 3.82 MIL/uL — ABNORMAL LOW (ref 4.22–5.81)
WBC: 10.1 10*3/uL (ref 4.0–10.5)

## 2011-02-15 NOTE — Consult Note (Signed)
NAME:  Matthew [name], Matthew [name] NO.:  [id number]  MEDICAL RECORD NO.:  [id number]  LOCATION:  5643                         FACILITY:  [facility]  PHYSICIAN:  Teena Dunk, D.D.S.DATE OF BIRTH:  [birth year]  DATE OF CONSULTATION:  02/14/2011 DATE OF DISCHARGE:                                CONSULTATION   HISTORY OF PRESENT ILLNESS:  Matthew [name] is a 65 year old male referred by Dr. Jacquelynn Cree for a dental consultation.  The patient was recently admitted with cavitary pneumonia of the left lung.  The patient was placed on IV antibiotic therapy.  A dental consultation was then requested to rule out dental infection that may be affecting the patient's systemic health.  MEDICAL HISTORY: 1. Left lung cavitary pneumonia.    a. Current IV antibiotic therapy under the care of Infectious     Disease, Dr. Michel Bickers. 2. Hypertension. 3. Diabetes mellitus type 2. 4. Metastatic renal cell carcinoma, status post right nephrectomy and subsequent treatments with interleukin and interferon. 5. Hypothyroidism. 6. History of sciatica. 7. Coronary artery disease, status post four-vessel coronary artery     bypass graft in December 2010 by Dr. Roxan Hockey. 8. History of abdominal aortic aneurysm. 9. Hyperlipidemia. 10.History of ulcerative colitis. 11.History of gout. 12.Benign prostatic hypertrophy. 13.History of atrial fibrillation. 14.Anxiety/depressive disorder. 15.Anemia. 16.History of gastroesophageal reflux disorder and peptic ulcer     disease. 17.Status post cataract surgery. 18.History of Fournier gangrene, status post incision and drainage in     January 2012 by Dr. Alinda Money.  ALLERGIES/ADVERSE DRUG REACTION: 1. TETANUS TOXOID causes welts. 2. ATENOLOL causes dizziness.  MEDICATIONS: 1. Amiodarone 200 mg daily. 2. Biotene Rinse twice daily. 3. Aspirin 325 mg daily. 4. Wellbutrin 300 mg daily. 5. Lovenox 60 mg subcutaneously daily. 6. Slow Fe 160 mg  twice daily. 7. Neurontin 300 mg twice daily. 8. Lantus insulin 54 units daily. 9. NovoLog insulin per sliding scale. 10.Synthroid 100 mcg daily. 11.Linagliptin 5 mg daily. 12.Protonix 80 mg twice daily. 13.Zosyn 3.375 g IV every 8 hours. 14.Vancomycin IV per protocol. 15.Crestor 20 mg at bedtime. 16.Bactroban nasally twice daily. 17.Flomax 0.4 mg at bedtime.  SOCIAL HISTORY:  The patient is married and lives with his wife.  The patient is disabled.  The patient is a nonsmoker, nondrinker at this time.  FAMILY HISTORY:  Mother is alive at the age of 22 with a history of Alzheimer's disease.  Father died at the age of 73 from pneumonia.  FUNCTIONAL ASSESSMENT:  The patient was independent for ADLs prior to this admission.  REVIEW OF SYSTEMS:  This was reviewed from the chart for this admission.  DENTAL HISTORY:  CHIEF COMPLAINT:  Dental consultation requested to rule out dental infection may be affecting the patient's systemic health.  HISTORY OF PRESENT ILLNESS:  The patient was recently diagnosed with left lung cavitary pneumonia.  Dental consultation was then requested to rule out possible dental etiology for the infection.  The patient currently denies acute toothache, swellings or abscesses but does have a history of toothaches.  The patient indicates that he has not seen a dentist for "a while."  The patient does indicate that he was planning on having all remaining teeth extracted  at sometime in the future with subsequent fabrication of upper and lower complete dentures after adequate healing.  The patient is interested in having all remaining teeth extracted at this time.  DENTAL EXAM:   General:  The patient is a well-developed, well-nourished male in no acute distress. Vital Signs:  Blood pressure is 120/68, pulse rate 88, respirations are 20, temperature is 98.6. Extraoral exam: There is no palpable submandibular lymphadenopathy.  The patient denies acute TMJ  symptoms. Intraoral Exam: The patient with a normal saliva.  I do not see any evidence of abscess formation. Dentition:  The patient with missing tooth numbers 1 through 16, 17 through 20, 29, 31 and 32.  The patient has retained roots in the area #27. Periodontal:  The  Patient with chronic advanced periodontal disease with plaque and calculus accumulations, generalized gingival recession and generalized tooth mobility. Dental Caries:  The patient has rampant dental caries affecting all remaining teeth. Endodontic:  The patient currently denies acute pulpitis symptoms today. The patient does have a history of previous toothache symptoms.  The patient appears to have periapical radiolucency associated with the lower right molar #30. Crown or Bridge:  There are no crown or bridge restorations noted. Prosthodontic:  The patient indicates that he has an upper complete denture, which he did not bring with him at this time.  The patient again is interested in having all remaining teeth extracted with subsequent fabrication of new upper and lower complete dentures.  The patient has atrophy of the edentulous maxillary alveolar ridge which will lead to decrease in retention and stability for the upper complete denture. Occlusion: Poor occlusal scheme. RADIOGRAPHIC INTERPRETATION:  Orthopantomogram x-ray was taken on February 13, 2011.  There are multiple missing teeth.  There are multiple dental caries noted.  There is moderate to severe bone loss.  There is periapical radiolucency associated with the molar tooth #30.  There are multiple diastema noted.  There is supereruption and drifting of the unopposed teeth into the edentulous areas.  ASSESSMENT: 1. Chronic periodontitis with bone loss. 2. Plaque and calculus accumulations. 3. Generalized gingival recession. 4. Generalized tooth mobility. 5. History of acute pulpitis symptoms. 6. Periapical pathology and radiolucency associated  with tooth #30. 7. Chronic apical periodontitis. 8. Rampant dental caries. 9. Multiple missing teeth. 10.Retained root segment in the area of #27. 11.History of maxillary complete denture but no lower partial denture.     I was unable to assess the clinical acceptability of the upper     denture at this time. 12.Poor occlusal scheme. 13.Current Lovenox therapy with risk for bleeding with invasive dental     procedures.  PLAN/RECOMMENDATIONS:   1. I discussed the risks, benefits, and complications of various treatment options with the patient in relationship to his medical and dental conditions.  We discussed various treatment options to include no treatment, total or subtotal extractions of the remaining teeth, preprosthetic surgery as indicated, alveoloplasty as indicated, periodontal therapy, dental restorations, root canal therapy, crown or bridge therapy, implant therapy, replacement of missing teeth as indicated after adequate healing by the primary dentist of his choice.  The patient does currently wish to proceed with extraction of all remaining teeth with alveoloplasty and preprosthetic surgery as indicated in the operating room with general anesthesia.  This is of course pending medical clearance by the Medical team.  The patient has been tentatively scheduled for Friday, February 16, 2011, at 1:30 p.m.  2. Discussion of findings with Dr. Michel Bickers and Dr.  Christina Rama to coordinate future operating room procedures, tentatively scheduled for this coming Friday at 1:30 p.m.          ______________________________ Teena Dunk, D.D.S.     RK/MEDQ  D:  02/14/2011  T:  02/14/2011  Job:  [id number]  cc:   Jacquelynn Cree, M.D.  Michel Bickers, M.D. Fax: [phone]  Electronically Signed by Teena Dunk D.D.S. on 02/15/2011 01:57:34 PM

## 2011-02-15 NOTE — Consult Note (Signed)
NAME:  Matthew [name], Matthew [name] NO.:  [id number]  MEDICAL RECORD NO.:  [id number]  LOCATION:  70                         FACILITY:  [facility]  PHYSICIAN:  Jerline Pain, MD      DATE OF BIRTH:  04/20/1946  DATE OF CONSULTATION:  02/15/2011 DATE OF DISCHARGE:                                CONSULTATION   CARDIOLOGIST:  Jettie Booze, MD  PRIMARY PHYSICIAN:  Gavin Pound , MD  REASON FOR CONSULTATION:  Evaluation of possible left ventricular thrombus, possible aortic valve vegetation, preoperative risk prior to dental surgery, wide complex tachycardia, and chronic systolic heart failure.  HISTORY OF PRESENT ILLNESS:  Mr. Hoopes is a 64 year old male with known coronary artery disease status post bypass surgery in 2010 with hypertension, diabetes, metastatic renal cell carcinoma status post right nephrectomy, abdominal aortic aneurysm who unfortunately has had recurrent IV antibiotic treatments for infection.  He presented to the emergency department with shaking chills and was diagnosed with a cavitary pneumonia.  He has had Fournier gangrene in the past and scrotal abscesses that grew out streptococcus.  He also has poor dentition and is about to undergo extensive dental procedure. Infectious Disease, Dr. Michel Bickers, has been consulted and recommended IV antibiotics and did not recommend transesophageal echocardiogram unless blood cultures were positive.  Today, they are no growth.  I was called because a transthoracic echocardiogram was read by Dr. Shelva Majestic as cannot exclude aortic valve vegetation and could not exclude an apical lateral thrombus.  His ejection fraction is in the 35% range which is unchanged from prior.  Also I was asked to give a preoperative risk stratification prior to dental procedure and I was also asked to talk about his wide complex tachycardia which was seen on telemetry for which he is currently on amiodarone.  In talking  with the patient, he just waking up from a nap, lying flat in his bed, and does not describe any significant shortness of breath.  He did respond well to Lasix therapy and I would continue with current treatment plan.  The wide complex tachycardia does not change QRS morphology and is continued right bundle-branch block morphology.  It is supraventricular in origin.  He currently does not complain of any chest pain.  He has an occasional headache.  He has been short of breath fairly chronically, but he states that he is able to walk around without any significant troubles.  He is extremely fatigued after this diagnosis of pneumonia.  PAST MEDICAL HISTORY:  Quite extensive medical history including, 1. Prior bypass. 2. Coronary artery disease. 3. Hypertension. 4. Diabetes. 5. Metastatic renal cell carcinoma. 6. Hypothyroidism. 7. Sciatica. 8. Abdominal aortic aneurysm. 9. Hyperlipidemia. 10.Fournier gangrene. 11.History of ulcerative colitis. 12.Gout. 13.Benign prostatic hypertrophy. 14.Current infections.  FAMILY HISTORY:  Mother is alive at age 82, has Alzheimer.  Father died at age 51 from pneumonia.  He has 1 healthy sister, 4 healthy offspring.  SOCIAL HISTORY:  He is married, lives with his wife.  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 He has no history of tobacco, alcohol, or drug use.  ALLERGIES:  No known drug allergies.  MEDICATIONS:  He entered the hospital on amiodarone 200 mg a day, however, he was not on his previously prescribed low-dose metoprolol. Please see history and physical for full medication details.  Cardiac medications include, 1. Amiodarone 200 mg a day. 2. Crestor 20 mg a day. 3. Nitroglycerin p.r.n.  REVIEW OF SYSTEMS:  As described above.  No bleeding.  No syncope. Denies feeling of palpitations.  Denies feeling supraventricular tachycardia.  Unless specified above all other 12 review of  systems negative.  PHYSICAL EXAMINATION:  VITAL SIGNS:  Temperature 98.6, pulse 87-107, respirations 20, blood pressure 109/63 to 135/80, saturating 96% on 5 L. GENERAL:  He is alert and oriented x3, conversant, pleasant, appears mildly fatigued, in no acute distress. EYES:  Well-perfused conjunctivae.  EOMI.  No scleral icterus. NECK:  Supple.  No lymphadenopathy.  No thyromegaly.  No carotid bruits. No JVD. CARDIOVASCULAR:  Regular rate and rhythm with soft systolic murmur, heard at left lower sternal border.  Normal PMI.  LUNGS:  Scattered rhonchi heard throughout.  No wheezes. ABDOMEN:  Soft, nontender, normoactive bowel sounds.  No rebound.  No guarding. EXTREMITIES:  No clubbing, cyanosis, or edema.  No evidence of embolic phenomenon.  Pulses 2+ and equal. GU:  Deferred. RECTAL:  Deferred. NEUROLOGIC:  Cranial nerves II through XII grossly intact.  Nonfocal.  DATA:  An EKG personally viewed shows sinus rhythm, right bundle-branch block morphology with nonspecific ST-T wave changes, and left anterior fascicular block.  Telemetry also personally viewed shows bursts of tachycardia which appear to be supraventricular in origin given no morphologic change in QRS confirmation.  In review of his prior records, there was a description of prior atrial flutter and fibrillation.  I do not believe that the current telemetry monitoring is consistent with atrial fibrillation or flutter currently.  CT angiogram shows 3 peripheral nodule opacities in the central cavitation of left lung, could be septic emboli.  Metastatic disease could not be fully excluded. No evidence of aortic dissection or aneurysm.  Aortic aneurysm measures 3.1 cm.  Right nephrectomy noted.  Echocardiogram, as described above in HPI ejection fraction of 35%.  LABORATORY DATA:  ProBNP 2373.  Creatinine 1.6.  Sodium 132.  White count 10.1, hemoglobin 11.8, platelets 312.  Hemoglobin A1c 12.3.  ESR 120.  No growth to  date blood cultures.  ASSESSMENT AND PLAN:  A 65 year old male with coronary artery disease status post bypass with extensive and multiple comorbidities, including hypertension, diabetes, hyperlipidemia, chronic systolic heart failure, supraventricular tachycardia. 1. Questionable lateral apical thrombus.  I personally viewed echo and     certainly this is not confirmed with the echocardiogram performed.     I have called Bili, our echo sonographer, here at [facility] and     have requested a contrast study to be performed with Definity     contrast to exclude any possible thrombus in the apical lateral     portion.  He is currently on Lovenox empirically.  If negative for     thrombus, one may discontinue high-dose Lovenox and resort to deep     vein thrombosis prophylaxis dosing. 2. 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  I do not see     any evidence of a vegetation present and he  does not have any     significant aortic regurgitation present as well.  Changes on     aortic valve are most likely secondary to aortic sclerotic changes.     In review of prior infectious disease notes from Dr. Michel Bickers,     I do agree that unless blood cultures are positive, I would not     pursue TEE at this time. 3. Wide complex tachycardia - as stated above, he has an underlying     right bundle-branch block and the morphology of this during the     tachycardia phase does not change, indicating that this is most     likely a supraventricular source.  This could be paroxysmal atrial     tachycardia or other supraventricular tachycardia.  It does not     appear to be atrial fibrillation, although he does have a history     of this.  Regardless, I do agree with continuation of amiodarone      200 mg once a day.  I have also re-started low-dose beta-blocker,     metoprolol succinate extended release 25 mg once a day to his     regimen.  This was probably discontinued at some point due to     hypotension, although this is not clear from review of medical     records.  With his concurrent systolic dysfunction, beta-blocker     would be helpful as well. 4. Chronic systolic heart failure - I agree with current plan, Lasix     was administered.  Continue treatment per primary team.  One could     consider ACE inhibitor therapy, however, his creatinine is in the     1.6 range and I would be fearful, in the setting that his     creatinine would increase.  For now, continue with beta-blocker     therapy. 5. Cavitary pneumonia, per primary team. 6. Diabetes, per primary team. 7. Preoperative risk stratification - if thrombus is negative on     contrast study limited echocardiogram, I do believe that he may     proceed with dental surgery/general anesthesia as he is not in any     evidence of heart failure currently.  He would be of mild-to-     moderate risk from a cardiovascular risk stratification perspective     given his underlying comorbidities and disease.  We will continue     to follow along with you.     Jerline Pain, MD     MCS/MEDQ  D:  02/15/2011  T:  02/15/2011  Job:  [id number]  cc:   Murray Hodgkins, MD  Jettie Booze, MD Fax: [phone]  Electronically Signed by Candee Furbish MD on 02/15/2011 09:06:34 PM

## 2011-02-16 DIAGNOSIS — K045 Chronic apical periodontitis: Secondary | ICD-10-CM

## 2011-02-16 DIAGNOSIS — J189 Pneumonia, unspecified organism: Secondary | ICD-10-CM

## 2011-02-16 DIAGNOSIS — K0530 Chronic periodontitis, unspecified: Secondary | ICD-10-CM

## 2011-02-16 DIAGNOSIS — K029 Dental caries, unspecified: Secondary | ICD-10-CM

## 2011-02-16 LAB — GLUCOSE, CAPILLARY
Glucose-Capillary: 166 mg/dL — ABNORMAL HIGH (ref 70–99)
Glucose-Capillary: 157 mg/dL — ABNORMAL HIGH (ref 70–99)

## 2011-02-16 LAB — BASIC METABOLIC PANEL
BUN: 14 mg/dL (ref 6–23)
CO2: 28 mEq/L (ref 19–32)
Chloride: 93 mEq/L — ABNORMAL LOW (ref 96–112)
Creatinine, Ser: 1.89 mg/dL — ABNORMAL HIGH (ref 0.50–1.35)
GFR calc Af Amer: 43 mL/min — ABNORMAL LOW (ref >60)
Glucose, Bld: 186 mg/dL — ABNORMAL HIGH (ref 70–99)

## 2011-02-16 LAB — URINE MICROSCOPIC-ADD ON

## 2011-02-16 LAB — URINALYSIS, ROUTINE W REFLEX MICROSCOPIC
Glucose, UA: 100 mg/dL — AB
Specific Gravity, Urine: 1.015 (ref 1.005–1.030)
pH: 6 (ref 5.0–8.0)

## 2011-02-17 DIAGNOSIS — I471 Supraventricular tachycardia: Secondary | ICD-10-CM

## 2011-02-17 LAB — GLUCOSE, CAPILLARY
Glucose-Capillary: 188 mg/dL — ABNORMAL HIGH (ref 70–99)
Glucose-Capillary: 204 mg/dL — ABNORMAL HIGH (ref 70–99)
Glucose-Capillary: 148 mg/dL — ABNORMAL HIGH (ref 70–99)

## 2011-02-17 LAB — BASIC METABOLIC PANEL
CO2: 31 mEq/L (ref 19–32)
Calcium: 9.2 mg/dL (ref 8.4–10.5)
Chloride: 93 mEq/L — ABNORMAL LOW (ref 96–112)
GFR calc Af Amer: 36 mL/min — ABNORMAL LOW (ref >60)
Sodium: 134 mEq/L — ABNORMAL LOW (ref 135–145)

## 2011-02-17 LAB — CBC
Platelets: 366 10*3/uL (ref 150–400)
RBC: 3.92 MIL/uL — ABNORMAL LOW (ref 4.22–5.81)
RDW: 13.2 % (ref 11.5–15.5)
WBC: 14.9 10*3/uL — ABNORMAL HIGH (ref 4.0–10.5)

## 2011-02-17 LAB — URINE CULTURE: Culture  Setup Time: 201209071138

## 2011-02-18 LAB — CBC
MCV: 92.2 fL (ref 78.0–100.0)
Platelets: 354 10*3/uL (ref 150–400)
RBC: 3.86 MIL/uL — ABNORMAL LOW (ref 4.22–5.81)
RDW: 13.3 % (ref 11.5–15.5)
WBC: 13.3 10*3/uL — ABNORMAL HIGH (ref 4.0–10.5)

## 2011-02-18 LAB — GLUCOSE, CAPILLARY
Glucose-Capillary: 172 mg/dL — ABNORMAL HIGH (ref 70–99)
Glucose-Capillary: 183 mg/dL — ABNORMAL HIGH (ref 70–99)
Glucose-Capillary: 153 mg/dL — ABNORMAL HIGH (ref 70–99)

## 2011-02-18 LAB — CULTURE, BLOOD (ROUTINE X 2)
Culture  Setup Time: 201209032339
Culture  Setup Time: 201209032339
Culture: NO GROWTH
Culture: NO GROWTH

## 2011-02-18 LAB — BASIC METABOLIC PANEL
CO2: 26 mEq/L (ref 19–32)
Chloride: 92 mEq/L — ABNORMAL LOW (ref 96–112)
Creatinine, Ser: 2.35 mg/dL — ABNORMAL HIGH (ref 0.50–1.35)
GFR calc Af Amer: 34 mL/min — ABNORMAL LOW (ref >60)
Potassium: 3.5 mEq/L (ref 3.5–5.1)
Sodium: 130 mEq/L — ABNORMAL LOW (ref 135–145)

## 2011-02-18 LAB — VANCOMYCIN, TROUGH: Vancomycin Tr: 21 ug/mL — ABNORMAL HIGH (ref 10.0–20.0)

## 2011-02-19 DIAGNOSIS — J189 Pneumonia, unspecified organism: Secondary | ICD-10-CM

## 2011-02-19 DIAGNOSIS — Z85528 Personal history of other malignant neoplasm of kidney: Secondary | ICD-10-CM

## 2011-02-19 LAB — BASIC METABOLIC PANEL
Calcium: 8.7 mg/dL (ref 8.4–10.5)
GFR calc Af Amer: 39 mL/min — ABNORMAL LOW (ref >60)
GFR calc non Af Amer: 32 mL/min — ABNORMAL LOW (ref >60)
Glucose, Bld: 238 mg/dL — ABNORMAL HIGH (ref 70–99)
Sodium: 133 mEq/L — ABNORMAL LOW (ref 135–145)

## 2011-02-19 LAB — GLUCOSE, CAPILLARY
Glucose-Capillary: 211 mg/dL — ABNORMAL HIGH (ref 70–99)
Glucose-Capillary: 191 mg/dL — ABNORMAL HIGH (ref 70–99)
Glucose-Capillary: 210 mg/dL — ABNORMAL HIGH (ref 70–99)

## 2011-02-19 NOTE — Op Note (Signed)
NAME:  Matthew [name], Matthew [name] NO.:  [id number]  MEDICAL RECORD NO.:  [id number]  LOCATION:  7494                         FACILITY:  [facility]  PHYSICIAN:  Teena Dunk, D.D.S.DATE OF BIRTH:  [birth year]  DATE OF PROCEDURE:  02/16/2011 DATE OF DISCHARGE:                              OPERATIVE REPORT   PREOPERATIVE DIAGNOSES: 1. Cavitary pneumonia. 2. Apical periodontitis. 3. Chronic periodontitis. 4. Rampant dental caries.  POSTOPERATIVE DIAGNOSES: 1. Cavitary pneumonia. 2. Apical periodontitis. 3. Chronic periodontitis. 4. Rampant dental caries.  OPERATIONS: 1. Extraction of remaining teeth (tooth #21, 22, 23, 24, 25, 26, 27,     28, and 30). 2. Two quadrants of alveoloplasty.  SURGEON:  Teena Dunk, DDS  ASSISTANT:  Arnoldo Hooker, (dental assistant).  ANESTHESIA:  General anesthesia via oral endotracheal tube.  Dr. Delma Post attending.  MEDICATIONS: 1. Zosyn and vancomycin, per protocol. 2. Local anesthesia with a total utilization of 2 carpules, each     containing 9 mg of bupivacaine with 0.009 mg of epinephrine as well     as 1 carpules containing 34 mg of lidocaine with 0.017 mg of     epinephrine.  SPECIMENS:  There were 9 teeth that were discarded.  DRAINS:  None.  CULTURES:  None.  COMPLICATIONS:  None.  ESTIMATED BLOOD LOSS:  100 mL.  FLUIDS:  700 mL lactated Ringer solution.  INDICATIONS:  The patient was recently diagnosed and admitted with cavitary pneumonia.  The patient placed on IV antibiotic therapy with Zosyn and vancomycin.  Dental consultation was requested to rule out possible dental etiology for the pneumonia.  The patient was examined and treatment planned for extraction of remaining teeth with alveoloplasty and preprosthetic surgery as indicated.  This treatment plan was formulated to decrease the risk and complications associated dental infection from further affecting the patient's systemic health.  OPERATIVE  FINDINGS:  The patient was examined in the operating room #11. Teeth were identified for extraction.  The patient noted to be affected by apical periodontitis, chronic periodontitis, rampant dental caries, and mobile teeth.  The aforementioned necessitated removal of all remaining teeth with alveoloplasty and preprosthetic surgery as indicated.  DESCRIPTION OF PROCEDURE:  The patient was brought to the main operating room #11.  The patient was then placed in supine position on the operating room table.  General anesthesia was then induced via an oral endotracheal tube.  The patient was then prepped and draped in usual manner for dental medicine procedure.  A time-out was performed.  The patient was identified and procedures were verified.  Throat pack was placed at this time.  The oral cavity was then thoroughly examined with findings noted above.  The patient was then ready for the dental medicine procedure as follows:  Local anesthesia was administered sequentially with a total utilization of 2 carpules, each containing 9 mg of bupivacaine with 0.009 mg of epinephrine as well as 1 carpule containing 34 mg of lidocaine with 0.017 mg of epinephrine.  The mandibular left and right quadrants were first approached.  The patient was given bilateral inferior alveolar nerve blocks and long buccal nerve blocks utilizing the bupivacaine with epinephrine.  Further infiltration was then achieved utilizing the lidocaine with  epinephrine via infiltration.  At this point in time, the mandibular quadrants were approached.  A 15 blade incision was then made from the distal of #19 and extended to the distal of #31, a surgical flap was then carefully reflected. Appropriate amounts of buccal and interseptal bone were removed with a surgical handpiece and bur and copious amounts of sterile saline.  The teeth were then subluxated with a series straight elevators.  The coronal aspect of tooth #21, 22,  23 were then removed leaving the roots remaining.  Tooth #24, 25, 26 were then removed with a 151 forceps without complications.  The coronal aspect tooth #27, 28, and 30 were then removed with a 151 forceps leaving the roots remaining.  Further bone was then removed with a surgical handpiece and bur and copious amounts of sterile saline.  The individual roots of tooth #21, 22, 23, 27, 28, and 30 were then removed with a 151 forceps without further complications.  Alveoplasty was then performed utilizing rongeurs and bone file.  The tissues were approximated and trimmed appropriately. Surgical sites were then irrigated with copious amounts of sterile saline x4.  The mandibular left surgical site was then closed from distal #19 and extended to the mesial #24 utilizing 3-0 chromic gut suture in a continuous septal suture technique x1.  The mandibular right surgical site was then closed from distal #31 and extended to the mesial #25 utilizing 3-0 chromic gut suture in a continuous interrupted suture technique x1.  At this point in time, the entire mouth was irrigated with copious amounts of sterile saline.  The patient was examined for complications, seeing none, dental medicine procedure deemed to be complete.  Throat pack was removed at this time.  A series of 4x4 gauze were placed in the mouth to aid hemostasis.  The patient was then handed over to the anesthesia team for final disposition.  After appropriate amount of time, the patient was extubated, and taken to the post-anesthesia care unit with stable vital signs, good oxygenation level.  All counts were correct for dental medicine procedure.  The patient will be followed and the sutures will dissolve on their own or can be removed in 7-10 days. The Lovenox will be discontinued until the morning unless there is active bleeding.  Antibiotic therapy will be continued per the medical team as indicated.           ______________________________ Teena Dunk, D.D.S.     RK/MEDQ  D:  02/16/2011  T:  02/16/2011  Job:  [id number]  cc:   [facility]  Julieta Bellini, MD  Jerline Pain, MD Fax: [phone]  Michel Bickers, M.D. Fax: [phone]  Electronically Signed by Teena Dunk D.D.S. on 02/19/2011 08:20:06 AM

## 2011-02-20 ENCOUNTER — Inpatient Hospital Stay (HOSPITAL_COMMUNITY): Payer: 59

## 2011-02-20 ENCOUNTER — Other Ambulatory Visit: Payer: Self-pay | Admitting: Interventional Radiology

## 2011-02-20 LAB — GLUCOSE, SEROUS FLUID: Glucose, Fluid: 80 mg/dL

## 2011-02-20 LAB — PROTEIN, BODY FLUID: Total protein, fluid: 4.4 g/dL

## 2011-02-20 LAB — CBC
Hemoglobin: 10.6 g/dL — ABNORMAL LOW (ref 13.0–17.0)
MCH: 30.5 pg (ref 26.0–34.0)
RBC: 3.47 MIL/uL — ABNORMAL LOW (ref 4.22–5.81)

## 2011-02-20 LAB — GLUCOSE, CAPILLARY: Glucose-Capillary: 88 mg/dL (ref 70–99)

## 2011-02-20 LAB — BODY FLUID CELL COUNT WITH DIFFERENTIAL
Monocyte-Macrophage-Serous Fluid: 1 % — ABNORMAL LOW (ref 50–90)
Total Nucleated Cell Count, Fluid: 2402 cu mm — ABNORMAL HIGH (ref 0–1000)

## 2011-02-20 LAB — LACTATE DEHYDROGENASE, PLEURAL OR PERITONEAL FLUID: LD, Fluid: 2362 U/L — ABNORMAL HIGH (ref 3–23)

## 2011-02-20 LAB — BASIC METABOLIC PANEL
CO2: 28 mEq/L (ref 19–32)
Calcium: 8.7 mg/dL (ref 8.4–10.5)
Glucose, Bld: 201 mg/dL — ABNORMAL HIGH (ref 70–99)
Sodium: 134 mEq/L — ABNORMAL LOW (ref 135–145)

## 2011-02-20 NOTE — Progress Notes (Signed)
NAME:  Matthew [name], Matthew [name] NO.:  [id number]  MEDICAL RECORD NO.:  [id number]  LOCATION:  4332                         FACILITY:  [facility]  PHYSICIAN:  Murray Hodgkins, MD    DATE OF BIRTH:  [birth year]                                PROGRESS NOTE   This is an interim summary of care describing the patient's hospital course from admission to the present day.  INTERIM DIAGNOSES: 1. Lung lesions in the left lung, initially presumed to be cavitary     pneumonia with possible septic emboli. 2. New loculated right pleural effusion, not present on admission. 3. Compensated systolic congestive heart failure. 4. Wide complex tachycardia/supraventricular tachycardia.  Stable. 5. Uncontrolled diabetes mellitus type 2, stable. 6. Status post dental procedure (dental extraction). 7. Acute renal failure, resolving. 8. History of metastatic renal cancer.  BRIEF NARRATIVE: This is a 65 year old man who was admitted on September 3rd for chest pain.  He was found to have cavitary lesions on chest CT, and was admitted for further evaluation and treatment, and then placed on empiric antibiotics of Zosyn and vancomycin.  He had very poor dental dentition and Dental Medicine was consulted for removal of the patient's teeth.  He also had a history apparently of MRSA, hence there was concern for septic emboli based on the CT appearance.  The patient's condition initially improved, and he was seen in consultation with Infectious Disease and was continued antibiotics.  Blood cultures remain negative and are final, and transthoracic echocardiogram was negative for vegetation or other abnormalities.  The first transthoracic echocardiogram showed the possibility of an apical thrombus in a possible vegetation; however, a second limited echocardiogram rule these out.  See Dr. Marlou Porch' note for further details.  The patient did grow out MRSA in his sputum and his antibiotics were narrowed to  vancomycin. However, there was still some suspicion that these atypical lesions may not be pneumonia and antibiotics were stopped in order to observe the patient.  CT-guided biopsy was recommended of the lesion.  The patient went for CT-guided biopsy today; however, he was able to comply with the breathing instructions and scout films revealed a large right pleural effusion which was new.  Therefore biopsy was abandoned at this point and right thoracentesis was obtained today, and it was noted the patient has moderate to large right loculated pleural effusion, which is new in the last 6 days since his previous CAT scans.  The etiology of this is unclear and fluids have been sent off for further analysis.  Infectious Disease recommends observing off antibiotics for the patient appears to be clinically stable although more tired today.  His respiratory status appears to be stable.  The patient earlier this hospitalization developed an acute exacerbation of systolic heart failure, which resolved rapidly with diuretics. However, he developed mild renal failure from this and so the diuretics were discontinued.  At this point, he was euvolemic and his creatinine slowly improving.  His diabetes remain stable.  At this point, disposition is unclear and is based on further analysis of his pleural fluid studies.  It is not clear whether he may require procedure for definitive drainage of his right-sided loculated pleural effusion.  I will consider pulmonology evaluation for any further recommendations.  I discussed the above with the patient's wife Janae Bridgeman, phone number [phone].     Murray Hodgkins, MD     DG/MEDQ  D:  02/20/2011  T:  02/20/2011  Job:  [id number]  Electronically Signed by Murray Hodgkins  on 02/20/2011 09:47:40 PM

## 2011-02-21 DIAGNOSIS — J90 Pleural effusion, not elsewhere classified: Secondary | ICD-10-CM

## 2011-02-21 LAB — GLUCOSE, CAPILLARY
Glucose-Capillary: 310 mg/dL — ABNORMAL HIGH (ref 70–99)
Glucose-Capillary: 161 mg/dL — ABNORMAL HIGH (ref 70–99)
Glucose-Capillary: 277 mg/dL — ABNORMAL HIGH (ref 70–99)

## 2011-02-21 LAB — PATHOLOGIST SMEAR REVIEW

## 2011-02-22 DIAGNOSIS — J189 Pneumonia, unspecified organism: Secondary | ICD-10-CM

## 2011-02-22 LAB — GLUCOSE, CAPILLARY
Glucose-Capillary: 179 mg/dL — ABNORMAL HIGH (ref 70–99)
Glucose-Capillary: 172 mg/dL — ABNORMAL HIGH (ref 70–99)

## 2011-02-22 LAB — CBC
MCH: 30.7 pg (ref 26.0–34.0)
MCHC: 32.9 g/dL (ref 30.0–36.0)
Platelets: 499 10*3/uL — ABNORMAL HIGH (ref 150–400)
RDW: 13.4 % (ref 11.5–15.5)

## 2011-02-22 LAB — BASIC METABOLIC PANEL
Calcium: 8.8 mg/dL (ref 8.4–10.5)
GFR calc non Af Amer: 33 mL/min — ABNORMAL LOW (ref >60)
Sodium: 133 mEq/L — ABNORMAL LOW (ref 135–145)

## 2011-02-23 ENCOUNTER — Inpatient Hospital Stay (HOSPITAL_COMMUNITY): Payer: 59

## 2011-02-23 ENCOUNTER — Inpatient Hospital Stay (HOSPITAL_COMMUNITY)
Admission: AD | Admit: 2011-02-23 | Discharge: 2011-03-02 | DRG: 163 | Disposition: A | Discharge status: Home Health | Payer: 59 | Source: Other Acute Inpatient Hospital | Attending: Internal Medicine | Admitting: Internal Medicine

## 2011-02-23 DIAGNOSIS — N390 Urinary tract infection, site not specified: Secondary | ICD-10-CM

## 2011-02-23 LAB — BASIC METABOLIC PANEL
BUN: 18 mg/dL (ref 6–23)
Chloride: 95 mEq/L — ABNORMAL LOW (ref 96–112)
GFR calc Af Amer: 41 mL/min — ABNORMAL LOW (ref >60)
Glucose, Bld: 203 mg/dL — ABNORMAL HIGH (ref 70–99)
Potassium: 4.7 mEq/L (ref 3.5–5.1)

## 2011-02-23 LAB — GLUCOSE, CAPILLARY
Glucose-Capillary: 180 mg/dL — ABNORMAL HIGH (ref 70–99)
Glucose-Capillary: 257 mg/dL — ABNORMAL HIGH (ref 70–99)

## 2011-02-24 ENCOUNTER — Inpatient Hospital Stay (HOSPITAL_COMMUNITY): Payer: 59

## 2011-02-24 DIAGNOSIS — J90 Pleural effusion, not elsewhere classified: Secondary | ICD-10-CM

## 2011-02-24 LAB — CBC
Hemoglobin: 11.2 g/dL — ABNORMAL LOW (ref 13.0–17.0)
MCH: 29.6 pg (ref 26.0–34.0)
RBC: 3.78 MIL/uL — ABNORMAL LOW (ref 4.22–5.81)
WBC: 12.4 10*3/uL — ABNORMAL HIGH (ref 4.0–10.5)

## 2011-02-24 LAB — BASIC METABOLIC PANEL
CO2: 28 mEq/L (ref 19–32)
Calcium: 9.4 mg/dL (ref 8.4–10.5)
Chloride: 96 mEq/L (ref 96–112)
Glucose, Bld: 195 mg/dL — ABNORMAL HIGH (ref 70–99)
Potassium: 4.3 mEq/L (ref 3.5–5.1)
Sodium: 135 mEq/L (ref 135–145)

## 2011-02-24 LAB — GLUCOSE, CAPILLARY: Glucose-Capillary: 280 mg/dL — ABNORMAL HIGH (ref 70–99)

## 2011-02-24 LAB — BODY FLUID CULTURE: Culture: NO GROWTH

## 2011-02-25 LAB — COMPREHENSIVE METABOLIC PANEL
ALT: 14 U/L (ref 0–53)
Alkaline Phosphatase: 89 U/L (ref 39–117)
BUN: 23 mg/dL (ref 6–23)
CO2: 29 mEq/L (ref 19–32)
GFR calc Af Amer: 38 mL/min — ABNORMAL LOW (ref >60)
GFR calc non Af Amer: 32 mL/min — ABNORMAL LOW (ref >60)
Glucose, Bld: 155 mg/dL — ABNORMAL HIGH (ref 70–99)
Potassium: 4.2 mEq/L (ref 3.5–5.1)
Sodium: 132 mEq/L — ABNORMAL LOW (ref 135–145)
Total Bilirubin: 0.3 mg/dL (ref 0.3–1.2)

## 2011-02-25 LAB — GLUCOSE, CAPILLARY: Glucose-Capillary: 200 mg/dL — ABNORMAL HIGH (ref 70–99)

## 2011-02-25 LAB — CBC
HCT: 34.7 % — ABNORMAL LOW (ref 39.0–52.0)
Hemoglobin: 11.6 g/dL — ABNORMAL LOW (ref 13.0–17.0)
MCH: 30.6 pg (ref 26.0–34.0)
MCHC: 33.4 g/dL (ref 30.0–36.0)
RDW: 13.6 % (ref 11.5–15.5)

## 2011-02-25 LAB — TYPE AND SCREEN: ABO/RH(D): A POS

## 2011-02-26 ENCOUNTER — Inpatient Hospital Stay (HOSPITAL_COMMUNITY): Payer: 59

## 2011-02-26 ENCOUNTER — Other Ambulatory Visit: Payer: Self-pay | Admitting: Cardiothoracic Surgery

## 2011-02-26 DIAGNOSIS — J90 Pleural effusion, not elsewhere classified: Secondary | ICD-10-CM

## 2011-02-26 DIAGNOSIS — J189 Pneumonia, unspecified organism: Secondary | ICD-10-CM

## 2011-02-26 HISTORY — PX: OTHER SURGICAL HISTORY: SHX169

## 2011-02-26 LAB — GRAM STAIN

## 2011-02-26 LAB — HIV ANTIBODY (ROUTINE TESTING W REFLEX): HIV: NONREACTIVE

## 2011-02-26 LAB — GLUCOSE, CAPILLARY: Glucose-Capillary: 149 mg/dL — ABNORMAL HIGH (ref 70–99)

## 2011-02-26 NOTE — Consult Note (Signed)
NAME:  Matthew [name], Matthew [name] NO.:  [id number]  MEDICAL RECORD NO.:  [id number]  LOCATION:  3028                         FACILITY:  [place]  PHYSICIAN:  Lanelle Bal, MD    DATE OF BIRTH:  1945/12/03  DATE OF CONSULTATION:  02/24/2011 DATE OF DISCHARGE:                                CONSULTATION   REQUESTING PHYSICIAN:  Dr. Charlane Ferretti.  PRIMARY CARE PHYSICIAN:  Gavin Pound , MD.  CARDIOLOGIST:  Jerline Pain, MD.  Request for consideration for drainage of right loculated pleural effusion has been requested.  At the time of request, most recent film was 5 days prior.  The patient is a 65 year old male who continues to use cocaine and he denies any use of IV drugs.  He was admitted to [facility] September 3 for vague right chest discomfort.  Over that period of time, numerous procedures have been done with a diagnosis of poor dentition, septic pulmonary emboli, metastatic renal cell have all been discussed, but without any firm diagnosis of any.  The patient does give a history of renal cell carcinoma, had a right nephrectomy. Notes that he was cared for in [place] more than 10 years ago, was told that he had more than 20 pulmonary nodules at that time and was treated with interleukin-2.  We have no confirmation of any of this information.  Initial CT scan of the chest showed small bilateral effusions with cavitary left lung lesion several days past and a needle biopsy of this was attempted, although at the time of the needle biopsy that lesion had dissipated to some degree and a large right pleural effusion was noted.  Thoracentesis on this was unsuccessful.  PAST MEDICAL HISTORY: 1. Renal cell carcinoma, unknown stage.  The patient gives a report of     metastatic to the lung, last treated in 2002. 2. History of coronary artery disease, CAD x3 on December 2010 by Dr.     Roxan Hockey, history of diabetes, history of hypertension, history     of  Fournier's gangrene, history of atrial fibrillation, treatment     with amiodarone, continued substance abuse, last cocaine use 2-3     weeks ago, history of ulcerative colitis, anemia, hypertension,     hyperthyroidism, known abdominal aortic aneurysm just over 3 cm.  MEDICATIONS:  Include amiodarone 200 mg a day, aspirin, Wellbutrin, Lovenox, iron, and Neurontin.  He has been on and off of also on Lantus, NovoLog, Synthroid, Linagliptin 5 mg a day, Protonix.  He has been on Zosyn and vancomycin, Crestor 20 mg a day, Bactroban, and Flomax.  FAMILY HISTORY AND SOCIAL HISTORY:  As noted above, history of cocaine use, ongoing.  FAMILY HISTORY:  The patient's mother is alive at age 33.  Father died at 37 with pneumonia.  REVIEW OF SYSTEMS:  The patient had some fevers and fatigue prior to admission.  He denies any fever and chills since he has been admitted. Denies hemoptysis.  Does have right chest pain.  Denies any sternal pop or click from his previous bypass surgery.  Other review of systems are negative.  PHYSICAL EXAMINATION:  The patient is afebrile.  Blood pressure is 110/60.  He  is on room air.  He is in no distress.  Appears his stated age.  Pupils are equal, round, and reactive light.  He has no cervical or supraclavicular adenopathy or carotid bruits, although, he has known fracture to sternal wires.  His sternum feels stable and is well healed. He has a regular rate and rhythm, early systolic murmur.  Abdominal exam is benign without palpable masses or tenderness in his midline.  The patient has a right flank incision from previous nephrectomy.  Also has a sternal incision from previous sternotomy.  He has decreased breath sounds over the right base.  LABORATORY FINDINGS:  Reveal renal insufficiency with a creatinine of 2, BUN of 16, potassium 4.3, hematocrit is 31.9.  White count 9.9.  The patient's CT scans are reviewed.  Chest x-ray from yesterday shows a large right  pleural effusion likely loculated.  IMPRESSION:  The patient with history of cocaine abuse but no intravenous drug use.  He is admitted and given the diagnosis of presumed septic emboli though this is not well documented.  Since being hospitalized, he has developed a large right pleural effusion that was unsuccessfully tapped.  On antibiotic therapy, pulmonary nodules on the left have subsided, but not completely resolved.  The patient has had negative blood cultures and no evidence of endocarditis by transthoracic echocardiogram.  The patient does have a history of renal cell carcinoma according to his history of metastatic to the lung more than 10 years ago.  SUGGESTIONS:  Follow up CT to see what the current status of his effusion is and isolated anatomically.  I have recommended to the patient that depending on the scan we proceed with right video-assisted thoracoscopy for drainage of loculated pleural effusion.     Lanelle Bal, MD     EG/MEDQ  D:  02/24/2011  T:  02/24/2011  Job:  [id number]  Electronically Signed by Lanelle Bal MD on 02/26/2011 09:54:13 AM

## 2011-02-27 ENCOUNTER — Inpatient Hospital Stay (HOSPITAL_COMMUNITY): Payer: 59

## 2011-02-27 LAB — POCT I-STAT 3, ART BLOOD GAS (G3+)
Acid-Base Excess: 1 mmol/L (ref 0.0–2.0)
Bicarbonate: 25.8 mEq/L — ABNORMAL HIGH (ref 20.0–24.0)
O2 Saturation: 94 %
TCO2: 27 mmol/L (ref 0–100)
pO2, Arterial: 69 mmHg — ABNORMAL LOW (ref 80.0–100.0)

## 2011-02-27 LAB — BASIC METABOLIC PANEL
BUN: 21 mg/dL (ref 6–23)
CO2: 24 mEq/L (ref 19–32)
Calcium: 8.7 mg/dL (ref 8.4–10.5)
Chloride: 95 mEq/L — ABNORMAL LOW (ref 96–112)
Creatinine, Ser: 1.93 mg/dL — ABNORMAL HIGH (ref 0.50–1.35)
GFR calc Af Amer: 42 mL/min — ABNORMAL LOW (ref >60)
GFR calc non Af Amer: 35 mL/min — ABNORMAL LOW (ref >60)
Glucose, Bld: 202 mg/dL — ABNORMAL HIGH (ref 70–99)
Potassium: 4.2 mEq/L (ref 3.5–5.1)
Sodium: 130 mEq/L — ABNORMAL LOW (ref 135–145)

## 2011-02-27 LAB — GLUCOSE, CAPILLARY
Glucose-Capillary: 194 mg/dL — ABNORMAL HIGH (ref 70–99)
Glucose-Capillary: 134 mg/dL — ABNORMAL HIGH (ref 70–99)

## 2011-02-27 LAB — CBC
HCT: 32.5 % — ABNORMAL LOW (ref 39.0–52.0)
Hemoglobin: 10.5 g/dL — ABNORMAL LOW (ref 13.0–17.0)
MCH: 29.7 pg (ref 26.0–34.0)
MCHC: 32.3 g/dL (ref 30.0–36.0)
MCV: 92.1 fL (ref 78.0–100.0)
Platelets: 587 10*3/uL — ABNORMAL HIGH (ref 150–400)
RBC: 3.53 MIL/uL — ABNORMAL LOW (ref 4.22–5.81)
RDW: 14 % (ref 11.5–15.5)
WBC: 11.4 10*3/uL — ABNORMAL HIGH (ref 4.0–10.5)

## 2011-02-28 ENCOUNTER — Inpatient Hospital Stay (HOSPITAL_COMMUNITY): Payer: 59

## 2011-02-28 LAB — CBC
HCT: 31.6 % — ABNORMAL LOW (ref 39.0–52.0)
Hemoglobin: 10.5 g/dL — ABNORMAL LOW (ref 13.0–17.0)
MCH: 30.4 pg (ref 26.0–34.0)
MCHC: 33.2 g/dL (ref 30.0–36.0)
MCV: 91.6 fL (ref 78.0–100.0)
Platelets: 550 10*3/uL — ABNORMAL HIGH (ref 150–400)
RBC: 3.45 MIL/uL — ABNORMAL LOW (ref 4.22–5.81)
RDW: 13.8 % (ref 11.5–15.5)
WBC: 9.9 10*3/uL (ref 4.0–10.5)

## 2011-02-28 LAB — GLUCOSE, CAPILLARY
Glucose-Capillary: 186 mg/dL — ABNORMAL HIGH (ref 70–99)
Glucose-Capillary: 138 mg/dL — ABNORMAL HIGH (ref 70–99)
Glucose-Capillary: 208 mg/dL — ABNORMAL HIGH (ref 70–99)
Glucose-Capillary: 157 mg/dL — ABNORMAL HIGH (ref 70–99)
Glucose-Capillary: 196 mg/dL — ABNORMAL HIGH (ref 70–99)
Glucose-Capillary: 213 mg/dL — ABNORMAL HIGH (ref 70–99)

## 2011-02-28 LAB — COMPREHENSIVE METABOLIC PANEL
ALT: 13 U/L (ref 0–53)
AST: 18 U/L (ref 0–37)
Albumin: 2.5 g/dL — ABNORMAL LOW (ref 3.5–5.2)
Alkaline Phosphatase: 77 U/L (ref 39–117)
BUN: 18 mg/dL (ref 6–23)
CO2: 29 mEq/L (ref 19–32)
Calcium: 9.1 mg/dL (ref 8.4–10.5)
Chloride: 100 mEq/L (ref 96–112)
Creatinine, Ser: 1.95 mg/dL — ABNORMAL HIGH (ref 0.50–1.35)
GFR calc Af Amer: 42 mL/min — ABNORMAL LOW (ref >60)
GFR calc non Af Amer: 34 mL/min — ABNORMAL LOW (ref >60)
Glucose, Bld: 125 mg/dL — ABNORMAL HIGH (ref 70–99)
Potassium: 4.4 mEq/L (ref 3.5–5.1)
Sodium: 137 mEq/L (ref 135–145)
Total Bilirubin: 0.2 mg/dL — ABNORMAL LOW (ref 0.3–1.2)
Total Protein: 7.2 g/dL (ref 6.0–8.3)

## 2011-03-01 ENCOUNTER — Inpatient Hospital Stay (HOSPITAL_COMMUNITY): Payer: 59

## 2011-03-01 LAB — GLUCOSE, CAPILLARY: Glucose-Capillary: 152 mg/dL — ABNORMAL HIGH (ref 70–99)

## 2011-03-01 LAB — BASIC METABOLIC PANEL
CO2: 30 mEq/L (ref 19–32)
Calcium: 10 mg/dL (ref 8.4–10.5)
Creatinine, Ser: 2.04 mg/dL — ABNORMAL HIGH (ref 0.50–1.35)
Glucose, Bld: 144 mg/dL — ABNORMAL HIGH (ref 70–99)
Sodium: 134 mEq/L — ABNORMAL LOW (ref 135–145)

## 2011-03-01 LAB — HEPATITIS PANEL, ACUTE
HCV Ab: NEGATIVE
Hepatitis B Surface Ag: NEGATIVE

## 2011-03-01 LAB — BODY FLUID CULTURE

## 2011-03-01 LAB — TISSUE CULTURE: Culture: NO GROWTH

## 2011-03-02 ENCOUNTER — Inpatient Hospital Stay (HOSPITAL_COMMUNITY): Payer: 59

## 2011-03-02 LAB — BASIC METABOLIC PANEL
Calcium: 9.6 mg/dL (ref 8.4–10.5)
GFR calc Af Amer: 40 mL/min — ABNORMAL LOW (ref >60)
GFR calc non Af Amer: 33 mL/min — ABNORMAL LOW (ref >60)
Glucose, Bld: 175 mg/dL — ABNORMAL HIGH (ref 70–99)
Potassium: 4.5 mEq/L (ref 3.5–5.1)
Sodium: 134 mEq/L — ABNORMAL LOW (ref 135–145)

## 2011-03-02 LAB — GLUCOSE, CAPILLARY

## 2011-03-05 LAB — DIFFERENTIAL
Basophils Absolute: 0
Eosinophils Absolute: 0
Eosinophils Relative: 0
Lymphocytes Relative: 23
Lymphs Abs: 1
Neutrophils Relative %: 66

## 2011-03-05 LAB — URINE MICROSCOPIC-ADD ON

## 2011-03-05 LAB — COMPREHENSIVE METABOLIC PANEL
ALT: 18
AST: 19
Albumin: 3.1 — ABNORMAL LOW
Calcium: 8.2 — ABNORMAL LOW
Creatinine, Ser: 2.27 — ABNORMAL HIGH
GFR calc Af Amer: 35 — ABNORMAL LOW
Sodium: 129 — ABNORMAL LOW

## 2011-03-05 LAB — URINALYSIS, ROUTINE W REFLEX MICROSCOPIC
Bilirubin Urine: NEGATIVE
Glucose, UA: NEGATIVE
Hgb urine dipstick: NEGATIVE
Ketones, ur: NEGATIVE
Specific Gravity, Urine: 1.022
pH: 5.5

## 2011-03-05 LAB — CBC
HCT: 34.4 — ABNORMAL LOW
MCV: 80.7
Platelets: 408 — ABNORMAL HIGH
RDW: 15.7 — ABNORMAL HIGH
WBC: 4.3

## 2011-03-05 NOTE — Op Note (Signed)
  NAME:  Matthew [name], Matthew [name] NO.:  [id number]  MEDICAL RECORD NO.:  [id number]  LOCATION:  2304                         FACILITY:  [employer] [facility]  PHYSICIAN:  Lanelle Bal, MD    DATE OF BIRTH:  August 22, 1945  DATE OF PROCEDURE:  02/26/2011 DATE OF DISCHARGE:                              OPERATIVE REPORT   PREOPERATIVE DIAGNOSIS:  Enlarging right lower chest, loculated effusion, probable empyema.  POSTOPERATIVE DIAGNOSIS:  Enlarging right lower chest, loculated effusion, probable empyema.  SURGICAL PROCEDURES:  Bronchoscopy, right video-assisted thoracoscopy, minithoracotomy, drainage of pleural effusion, and decortication.  SURGEON:  Lanelle Bal, MD  FIRST ASSISTANT:  Doroteo Bradford, PA  BRIEF HISTORY:  The patient is a 65 year old male who has been admitted for several weeks at [facility] and was noted on serial CT scans to have increasing loculated right lower chest effusion.  Thoracentesis was unsuccessful.  Indirect drainage was recommended.  The patient agreed and signed informed consent.  DESCRIPTION OF PROCEDURE:  With arterial line and central line placed, the patient underwent general endotracheal anesthesia without incidence. Through the endotracheal tube, a fiberoptic bronchoscope was passed to the subsegmental level with no evidence of endobronchial lesion.  They are very few secretions.  The mucosa the tracheobronchial tree appeared normal.  The scope was removed.  A double-lumen endotracheal tube was placed.  The patient was turned in lateral decubitus position with the right side up. A small port incision was made in the posterior axillary line at the lower chest.  Through this, the fluid cavity was entered with a 30-degree scope.  Numerous loculations were present.  Some of the slightly murky fluid was removed.  It was not a bloody effusion.  The small incision was made and through this, decortication of the lower lung was performed and a  single 28 angled chest tube was left in the space.  A single pericostal stitch was placed to stabilize the rib.  The muscle layers were closed with interrupted 0 Vicryl, 2-0 Vicryl in subcutaneous tissue and 3-0 subcuticular stitch in skin edges.  Dry dressings were applied.  Blood loss was fairly minimal.  Approximately 500-600 mL of fluid was removed.  The fibrinous debris that was removed was divided in aliquots and sent both for culture and for pathology.     Lanelle Bal, MD     EG/MEDQ  D:  02/27/2011  T:  02/27/2011  Job:  [id number]  Electronically Signed by Lanelle Bal MD on 03/05/2011 02:06:40 PM

## 2011-03-06 ENCOUNTER — Other Ambulatory Visit: Payer: Self-pay | Admitting: Emergency Medicine

## 2011-03-06 ENCOUNTER — Emergency Department (HOSPITAL_COMMUNITY)
Admission: EM | Admit: 2011-03-06 | Discharge: 2011-03-06 | Disposition: A | Discharge status: Home / Self Care | Payer: 59 | Attending: Internal Medicine | Admitting: Internal Medicine

## 2011-03-06 ENCOUNTER — Other Ambulatory Visit (HOSPITAL_COMMUNITY): Payer: Self-pay | Admitting: Internal Medicine

## 2011-03-06 DIAGNOSIS — D649 Anemia, unspecified: Secondary | ICD-10-CM | POA: Insufficient documentation

## 2011-03-06 DIAGNOSIS — Z905 Acquired absence of kidney: Secondary | ICD-10-CM | POA: Insufficient documentation

## 2011-03-06 DIAGNOSIS — Z794 Long term (current) use of insulin: Secondary | ICD-10-CM | POA: Insufficient documentation

## 2011-03-06 DIAGNOSIS — Z951 Presence of aortocoronary bypass graft: Secondary | ICD-10-CM | POA: Insufficient documentation

## 2011-03-06 DIAGNOSIS — R195 Other fecal abnormalities: Secondary | ICD-10-CM | POA: Insufficient documentation

## 2011-03-06 DIAGNOSIS — N289 Disorder of kidney and ureter, unspecified: Secondary | ICD-10-CM | POA: Insufficient documentation

## 2011-03-06 DIAGNOSIS — E119 Type 2 diabetes mellitus without complications: Secondary | ICD-10-CM | POA: Insufficient documentation

## 2011-03-06 DIAGNOSIS — Z85528 Personal history of other malignant neoplasm of kidney: Secondary | ICD-10-CM | POA: Insufficient documentation

## 2011-03-06 LAB — POCT I-STAT, CHEM 8
BUN: 21 mg/dL (ref 6–23)
Calcium, Ion: 1.21 mmol/L (ref 1.12–1.32)
Chloride: 103 mEq/L (ref 96–112)
Creatinine, Ser: 1.7 mg/dL — ABNORMAL HIGH (ref 0.50–1.35)
TCO2: 25 mmol/L (ref 0–100)

## 2011-03-06 LAB — CBC
HCT: 33 % — ABNORMAL LOW (ref 39.0–52.0)
MCH: 30.3 pg (ref 26.0–34.0)
MCV: 92.7 fL (ref 78.0–100.0)
Platelets: 479 10*3/uL — ABNORMAL HIGH (ref 150–400)
RDW: 13.7 % (ref 11.5–15.5)

## 2011-03-07 ENCOUNTER — Emergency Department (HOSPITAL_COMMUNITY): Admission: EM | Admit: 2011-03-07 | Payer: 59 | Source: Home / Self Care

## 2011-03-08 ENCOUNTER — Other Ambulatory Visit (HOSPITAL_COMMUNITY): Payer: Self-pay | Admitting: Internal Medicine

## 2011-03-08 DIAGNOSIS — N390 Urinary tract infection, site not specified: Secondary | ICD-10-CM

## 2011-03-10 NOTE — Discharge Summary (Signed)
NAME:  DAVY, WESTMORELAND NO.:  [id number]  MEDICAL RECORD NO.:  [id number]  LOCATION:  0626                         FACILITY:  [facility]  PHYSICIAN:  Bonnielee Haff, MD     DATE OF BIRTH:  1946/01/23  DATE OF ADMISSION:  02/12/2011 DATE OF DISCHARGE:  02/23/2011                              DISCHARGE SUMMARY   Please review the note dictated by Dr. Sarajane Jews on September 11th for details regarding the patient's hospital stay.  Imaging studies done include: 1. CT of the chest without contrast on September 11th, which showed     decrease in the size of the cavitary nodules in the left upper     lobe; however, a new development of moderate-to-large right-sided     loculated pleural effusion was seen. 2. The patient underwent ultrasound-guided thoracentesis on the same     day.  He had a chest x-ray done post thoracentesis, which did not     show any pneumothorax. 3. He had a chest x-ray, September 14th, which once again showed     loculated right-sided pleural effusion.  CONSULTATIONS DURING THIS HOSPITALIZATION: 1. Pulmonology. 2. Dr. Servando Snare with Thoracic Surgery. 3. Dr. Marlou Porch with Cardiology. 4. Dr. Enrique Sack, Dental Surgery.  Other procedures include dental extraction and alveoloplasty.  DIAGNOSES AT THE TIME OF THIS TRANSFER: 1. Loculated pleural effusion, requiring VATS procedure. 2. Cavitary lung lesions, etiology unclear, possible metastatic     process. 3. Compensated systolic congestive heart failure. 4. Wide complex tachycardia/supraventricular tachycardia, currently on     amiodarone. 5. Uncontrolled diabetes type 2. 6. Status post dental procedure. 7. Acute renal failure, improved. 8. History of metastatic renal cancer.  BRIEF HOSPITAL COURSE:  This is a 65 year old Caucasian male who presented to the hospital with complaints of chest pain.  He was found to have cavitary lesions on his chest CT and was subsequently admitted. He was started on  antibiotics.  Infectious Disease was also involved. However, it was subsequently felt that this may not be an infectious process.  The patient did have symptomatic improvement, however.  So a repeat CT was done, which showed that the cavitary nodules were decreasing in size; however, a new large pleural effusion was noted on the right side.  Thoracentesis was attempted; however, because of the loculation, not all the fluid was removed.  Subsequently, Pulmonology was involved and they recommended that we contact Thoracic Surgery.  Dr. Servando Snare saw all the imaging studies and he recommended that the patient undergo VATS procedure.  To facilitate, this patient was transferred to [facility].  There was an attempt made also to biopsy some of these cavitary nodules. However, because the patient could not cooperate, biopsy was unsuccessful.  It is felt that since he does have a history of metastatic renal cancer, these nodules could represent metastatic process.  This will need to be addressed once the patient's pleural effusion is evacuated.  He does have a history of systolic congestive heart failure, which is compensated.  During this hospitalization, he had episode of wide complex tachycardia versus supraventricular tachycardia.  The patient was started on amiodarone.  He was seen by Dr. Marlou Porch with Cardiology who recommended continuing the  same.  He has a history of uncontrolled diabetes, which is stable at this time.  He had acute renal failure.  His renal function is stable around 1.9 to 2.  He did receive some IV fluids at the initial part of his hospitalization.  He was found to have poor dentition at the initial examination.  He underwent orthopantogram and this showed numerous dental cavities.  So this prompted evaluation by dental surgery and they extracted all his teeth.  So at this time, disposition is not entirely clear.  It is unclear when the VATS procedure  will be done.  Dr. Servando Snare requested the patient be transferred to [facility] today.  He is currently off all antibiotics.  We will recommend monitoring his renal function closely over the next few days.  If his renal function does not improve or gets worse, nephrology consultation will be recommended.  Once the time and date of surgery is decided, his Lovenox will also need to be held and we will also need to adjust his insulin regimen.   Bonnielee Haff, MD     GK/MEDQ  D:  02/23/2011  T:  02/23/2011  Job:  [id number]  Electronically Signed by Bonnielee Haff MD on 03/10/2011 07:24:38 PM

## 2011-03-21 ENCOUNTER — Other Ambulatory Visit: Payer: Self-pay | Admitting: Cardiothoracic Surgery

## 2011-03-21 DIAGNOSIS — D381 Neoplasm of uncertain behavior of trachea, bronchus and lung: Secondary | ICD-10-CM

## 2011-03-22 ENCOUNTER — Ambulatory Visit: Payer: Self-pay | Admitting: Cardiothoracic Surgery

## 2011-03-22 DIAGNOSIS — N912 Amenorrhea, unspecified: Secondary | ICD-10-CM | POA: Insufficient documentation

## 2011-03-22 DIAGNOSIS — E039 Hypothyroidism, unspecified: Secondary | ICD-10-CM | POA: Insufficient documentation

## 2011-03-22 DIAGNOSIS — J869 Pyothorax without fistula: Secondary | ICD-10-CM | POA: Insufficient documentation

## 2011-03-22 DIAGNOSIS — I10 Essential (primary) hypertension: Secondary | ICD-10-CM | POA: Insufficient documentation

## 2011-03-22 DIAGNOSIS — I471 Supraventricular tachycardia: Secondary | ICD-10-CM | POA: Insufficient documentation

## 2011-03-22 DIAGNOSIS — R911 Solitary pulmonary nodule: Secondary | ICD-10-CM | POA: Insufficient documentation

## 2011-03-22 DIAGNOSIS — C649 Malignant neoplasm of unspecified kidney, except renal pelvis: Secondary | ICD-10-CM | POA: Insufficient documentation

## 2011-03-22 DIAGNOSIS — I2510 Atherosclerotic heart disease of native coronary artery without angina pectoris: Secondary | ICD-10-CM | POA: Insufficient documentation

## 2011-03-25 NOTE — Discharge Summary (Signed)
NAME:  Matthew [name], Matthew [name] NO.:  [id number]  MEDICAL RECORD NO.:  [id number]  LOCATION:  1324                         FACILITY:  [facility]  PHYSICIAN:  Vernell Leep, MD     DATE OF BIRTH:  [birth year]  DATE OF ADMISSION:  02/23/2011 DATE OF DISCHARGE:  03/02/2011                        DISCHARGE SUMMARY - REFERRING   PRIMARY CARE PHYSICIAN:  Gavin Pound, MD  CARDIOLOGIST:  Jerline Pain, MD  DISCHARGE DIAGNOSES: 1. Right empyema thoracis, status post chest tube. 2. Type 2 diabetes mellitus/insulin dependent. 3. Acute-on-chronic kidney disease. 4. Paroxysmal supraventricular tachycardia. 5. Compensated chronic systolic congestive heart failure. 6. History of renal cell carcinoma. 7. Anemia. 8. Left upper lobe lesion or nodule and left lower lobe lesion for     outpatient followup. 9. Hypertension. 10.Coronary artery disease, status post coronary artery bypass graft. 11.History of cocaine abuse.  DISCHARGE MEDICATIONS: 1. Tylenol 650 mg p.o. q.4 hourly p.r.n. pain. 2. Invanz 1 g IV daily to be discontinued after March 19, 2011 dose. 3. NovoLog 4 units subcutaneously t.i.d. with meals. 4. Toprol-XL 25 mg p.o. daily. 5. Vancomycin 1000 mg IV b.i.d. to be discontinued after March 19, 2011 doses. 6. Levemir increased to 70 units subcutaneously daily. 7. Amiodarone 200 mg p.o. daily. 8. Analpram E 4.0-1 and 1% one application rectally b.i.d. p.r.n. for hemorrhoids. 9. Aspirin 325 mg p.o. daily. 02.VOZDGUYQ cream one application topically b.i.d. 11.Crestor 20 mg p.o. nightly. 12.Ferrous sulfate 160 mg p.o. b.i.d. 13.Fexmid 7.5 mg p.o. t.i.d. p.r.n. spasms. 14.Flomax 0.4 mg p.o. nightly. 15.Gabapentin 300 mg p.o. b.i.d. 16.Januvia 50 mg p.o. daily. 17.Levothyroxine 100 mcg p.o. daily. 18.MiraLax 17 g p.o. daily p.r.n. constipation. 19.Nitroglycerin 0.4 mg sublingual p.r.n. chest pain. 20.Omeprazole 40 mg p.o. b.i.d. 21.Wellbutrin XL 300 mg p.o.  daily.  PROCEDURES: 1. Bronchoscopy, right video-assisted thoracoscopy, minithoracotomy, drainage of pleural effusion and decortication on September 17,2012. 2. Right IJ central venous catheter placement under ultrasound and     fluoroscopy guidance on March 02, 2011. 3. Ultrasound-guided diagnostic and therapeutic right thoracentesis on February 23, 2011, when 150 mL of pleural fluid was removed. 4. Dental extraction.  IMAGING: 1. Chest x-ray March 01 2011, Impression:  A.  Right chest tube     removed.  Residual right pleural effusion but no pneumothorax     identified.  B.  Increased bibasilar atelectasis. 2. Chest x-ray February 28, 2011, Impression:  A.  Postoperative     changes in the right hemithorax with a right chest tube in place.     No definite pneumothorax.  B.  Lingular and left basilar airspace     disease may be due to atelectasis, stable. 3. Chest x-ray February 27, 2011, Impression:  A. Right-sided chest     tube remaining in place without pneumothorax.  B.  Pleural     parenchymal opacity at the right lung bases, improved.  C.  Patchy     left base atelectasis is persist. 4. Chest x-ray February 26, 2011, Impression:  Interval central line     and right chest tube placement.  Improved pleural parenchymal     opacities in the right hemithorax. 5. CT of the chest without contrast February 25, 2011,  Impression:     A. Smaller left lower lobe lesion, previously cavitary in     appearance.  B.  Stable appearance of left upper lobe nodule.  C.     Small right loculated pleural effusion associated with atelectasis. 6. Chest x-ray February 23, 2011, Impression:  A.  Given difference     in technique similar appearance of a loculated posterior right-     sided pleural effusion.  B. Improved left-sided aeration.  C.     Trace left pleural fluid. 7. CT of the chest without contrast on February 20, 2011, Impression:     A. Interval decrease in size abdominal  cavitary nodule within the     left upper lobe now measuring approximately 2 cm in diameter,     previously 2.7 cm with interval resolution of central cavitation.     Additional pulmonary nodules are grossly unchanged in size,     however, no longer demonstrate additional central cavitation.     These findings are nonspecific, however, favor infectious process.     B.  Interval development of a moderate to large right-sided     possible loculated pleural effusion.  C.  The patient demonstrated     difficulty following breathing instructions and appeared somewhat     confused on the CT fluoroscopy table. 8. Chest x-ray February 20, 2011, Impression:  No pneumothorax,     status post thoracentesis.  Moderate loculated right pleural     effusion. 9. CT angiogram of the chest, abdomen and pelvis on February 14, 2011,     Impression:  CT of the chest impression, A. Three peripheral     nodular airspace opacities with central cavitation in the left     lung.  Findings can be seen in the setting of septic emboli,     atypical of fungal infection or Wegener granulomatosis.  Metastatic     disease cannot be completely excluded, especially given history of     renal cell carcinoma.  B.  Small bilateral pleural effusions.  C.     Negative for thoracic aortic dissection or aneurysm. 10.CT angiogram of the abdomen, Impression, A. Stable small infrarenal     abdominal aortic aneurysm measuring 3.1 cm AP diameter.  B.     Negative aortic dissection.  C.  Right nephrectomy. 11.Orthopantogram on February 13, 2011, Impression:  Numerous dental     cavities in the remaining mandibular teeth. 12.Orthopantogram on February 12, 2011, Impression:  A.  Absent     maxillary deep.  B.  Significant carious involvement of remaining     mandibular.  C.  Possible periapical abscess of the remaining     mandibular molar. 13.Chest x-ray on February 12, 2011, Impression:  Slight progression     and bibasilar airspace  disease and small bilateral pleural     effusions. 14.His chest x-ray on February 11, 2011, Impression:  Nonobstructive     bowel gas pattern.  Suggestion of small right pleural effusion. 15.Chest x-ray February 11, 2011, Impression:  Developing linear     atelectasis or infiltration in the lung bases. 16. A 2-D echocardiogram February 16, 2011, Impression:  No evidence of     left ventricle thrombus with definite contract study.  EF is in the     45-50% range. 17. A 2-D echocardiogram February 13, 2011, Impression:  Left     ventricular ejection fraction 35-40%.  Severe hypokinesis to     akinesis of the mid to  distal anteroseptal and apical myocardium.     Features are consistent with pseudonormal left ventricular filling     pattern and grade 2 diastolic dysfunction.  PERTINENT LABS:  Basic metabolic panel today significant for sodium 134, glucose 175, BUN 25, creatinine 2.03.  Right pleural fluid culture is no growth final report.  Acute hepatitis panel was negative except for hepatitis B core antibody IgM which was indeterminate.  The right pleural tissue culture also no growth after 3 days final report. Hepatic panel only significant for albumin 2.5.  CBC hemoglobin 10.5, hematocrit 32, white blood cell 9.9, platelets 550.  HIV antibody was nonreactive.  Urine osmolarity 225, serum osmolarity 285, serum uric acid 4.8.  Blood cultures x2 from September 3rd were no growth on final report.  Urine culture from September 7 was negative.  Pro BNP 2373 on September 6.  Hemoglobin A1c 12.3.  ESR was 120 on September 5.  TSH was 1.054.  Cytopathology of the right pleural fluid showed no malignant cells and showed reactive mesothelial cells.  Right lung pleural peel showed acellular fibrin with mixed inflammation. Separate findings of benign soft tissue and muscle.  Negative for malignancy.  Right pleural fluid from February 20, 2011, showed acute inflammation. No malignant cells  identified.  CONSULTATIONS: 1. Infectious Disease Dr. Bobby Rumpf. 2. Cardiothoracic Surgery Dr. Lanelle Bal. 3. Cardiology, Dr. Candee Furbish. 4. Dental, Dr. Teena Dunk.  DIET:  Diabetic diet.  ACTIVITY:  Ad lib.  TODAY'S COMPLAINTS:  None.  The patient is anxious to returning home and back to his work.  He denies any chest pain or dyspnea.  He is ambulating in the halls and tolerated diet.  PHYSICAL EXAMINATION:  GENERAL:  The patient is in no obvious distress. VITAL SIGNS:  Temperature 97.6 degrees Fahrenheit, pulse 71 per minute regular, respirations 18 per minute, blood pressure 106/63 mmHg and saturating at 97% on room air.  CBGs range between 152-218 mg/dL. RESPIRATORY SYSTEM:  Clear. CARDIOVASCULAR SYSTEM:  First and second heart sounds heard, regular. No murmurs or JVD. ABDOMEN:  Nondistended, soft and normal bowel sounds heard. CENTRAL NERVOUS SYSTEM:  Patient is awake, alert, oriented x3 with no focal neurological deficits.  HOSPITAL COURSE:  Mr. Rosensteel is a 65 year old male patient with history of renal cell carcinoma status post nephrectomy, hypertension, type 2 diabetes mellitus, hypothyroidism, coronary artery disease status post CABG, abdominal aortic aneurysm who presented initially on September 3rd with chest pain.Marland Kitchen  He was found to have cavitary lesions on CT chest.  He was empirically placed on broad-spectrum antibiotics including Zosyn and vancomycin.  He had very poor dentition and Dental Medicine was consulted and the patient had dental extraction.  There was a concern for septic emboli on the CT of the chest.  Infectious Disease was consulted.  There was a concern for an apical thrombus on the first echocardiogram.  However, a second limited echocardiogram did not show that.  A plan for CT-guided biopsy of atypical lesions on the chest were planned, but the patient could not comply.  The biopsy was cancelled. Thoracentesis was initially  done which did not reveal any malignant cells.  PROBLEM: 1. Right-sided empyema.  Eventually, the patient was found to have a     loculated pleural effusion which remained after the first     thoracentesis.  The patient had VATS surgery and placement of chest     tube on the right side.  Infectious Disease continued to see him.  Although, his cultures are negative, there is a high index of     clinical suspicion for empyema, thereby the Infectious Disease MD     has recommended to complete a total 3 weeks of IV Invanz and     vancomycin.  For this reason, central tunneled catheter was placed by     Interventional Radiology.  His antibiotics will be done by [facility] and his antibiotics will be discontinued on March 21, 2011. 2. Acute-on-chronic kidney disease.  The patient has possibly achieved     a new baseline creatinine.  His renal functions will have to be     followed as an outpatient closely. 3. Uncontrolled type 2 insulin-dependent diabetes mellitus.     Obviously, given his high A1c he was not well controlled as an     outpatient.  His insulins will have to be titrated as an outpatient     as deemed necessary.  Unclear about his compliance with medications     or diet. 4. Paroxysmal supraventricular tachycardia.  Cardiology continued to     see him in the hospital and he remains on amiodarone, metoprolol     and full-dose aspirin.  He has been in sinus rhythm for days. 5. Acute-on-chronic systolic congestive heart failure and acute CHF     resolved. 6. History of renal cell carcinoma.  Outpatient followup with his     primary care physician as deemed necessary. 7. Anemia, this has been stable. 8. Left upper lobe lesion/nodule and left lower lobe lesion on imaging     of the chest.  Recommend outpatient close followup as deemed     necessary. 9. Hypertension which is reasonably controlled. 10.Poor dentition.  The patient had dental extraction and is  to     follow up with a dentist as an outpatient.  DISPOSITION:  The patient is discharged home in stable condition.  FOLLOWUP RECOMMENDATIONS: 1. Dr.Adaku Nnodi.  The patient is to call for an appointment to be     seen in 5-7 days with blood test that is CBC , BMET. 2. Dr. Candee Furbish, Cardiology.  The patient is to call for an     appointment. 3. Dr. Lanelle Bal.  The patient is to call for an appointment. 4. Dr. Enrique Sack.  The patient is to call for an appointment. 5. Discontinue his vancomycin and Invanz after March 19, 2011, dose. Until then the antibiotics are to be done per home health pharmacy protocol.  Time taken in coordinating this discharge is 60 minutes.     Vernell Leep, MD     AH/MEDQ  D:  03/02/2011  T:  03/02/2011  Job:  [id number]  cc:   Teena Dunk, D.D.S. Gavin Pound, MD Jerline Pain, MD Lanelle Bal, MD  Electronically Signed by Vernell Leep MD on 03/25/2011 10:32:46 PM

## 2011-03-30 ENCOUNTER — Encounter: Payer: Self-pay | Admitting: *Deleted

## 2011-03-30 ENCOUNTER — Other Ambulatory Visit: Payer: Self-pay | Admitting: Cardiothoracic Surgery

## 2011-03-30 DIAGNOSIS — J90 Pleural effusion, not elsewhere classified: Secondary | ICD-10-CM

## 2011-04-02 ENCOUNTER — Ambulatory Visit (INDEPENDENT_AMBULATORY_CARE_PROVIDER_SITE_OTHER): Payer: Self-pay | Admitting: Physician Assistant

## 2011-04-02 ENCOUNTER — Ambulatory Visit
Admission: RE | Admit: 2011-04-02 | Discharge: 2011-04-02 | Disposition: A | Discharge status: Home / Self Care | Payer: Medicare Other | Source: Ambulatory Visit | Attending: Cardiothoracic Surgery | Admitting: Cardiothoracic Surgery

## 2011-04-02 VITALS — BP 106/73 | HR 62 | Resp 18 | Ht 72.0 in | Wt 243.0 lb

## 2011-04-02 DIAGNOSIS — J869 Pyothorax without fistula: Secondary | ICD-10-CM

## 2011-04-02 DIAGNOSIS — J90 Pleural effusion, not elsewhere classified: Secondary | ICD-10-CM

## 2011-04-02 NOTE — Progress Notes (Signed)
PCP is Gavin Pound, MD Referring Provider is Gavin Pound, MD  Chief Complaint  Patient presents with  . Follow-up    F/U empyema with CXR/ PICC Line needs to be removed    HPI: Matthew [name] is here today for a 3 week postoperative followup. He is status post right vats with drainage of pleural effusion and decortication by Dr. Servando Snare on 02/26/2011. He has done well since his discharge. He is having no pain and is not requiring any pain medication at this point. He has completed a 3 week course of Invanz and vancomycin as per infectious disease and his antibiotics were discontinued on October 8. He has not had a followup with infectious disease but has seen his primary care physician Dr. Donnamarie Rossetti denies any cough or shortness of breath. All intraoperative cultures ultimately were negative.  Past Medical History  Diagnosis Date  . Diabetes mellitus   . Hypertension   . Cancer   . Empyema lung   . Nodule of left lung   . Chronic kidney disease   . CAD (coronary artery disease)   . Renal cell carcinoma     History of  . Amenia   . Cocaine abuse in remission   . Paroxysmal supraventricular tachycardia   . Hypothyroidism     Past Surgical History  Procedure Date  . Bronchoscopy, right video-assisted thoracoscopy, minithoracotomy, drainage of pleural effusion, and decortication. 02/26/2011    [facility]  . Tooth extraction   . Irrigation and debridement of scrotal wound and closure of scrotal wound.   . Incision, drainage and debridement of perineum and  scrotum   . Cabg x 4 05/13/2009    [facility]  . Right radical nephrectomy.     Family History  Problem Relation Age of Onset  . Alzheimer's disease Mother   . Heart disease Father     Social History History  Substance Use Topics  . Smoking status: Never Smoker   . Smokeless tobacco: Never Used  . Alcohol Use: Not on file    Current Outpatient Prescriptions  Medication Sig Dispense Refill  . acetaminophen (TYLENOL)  650 MG CR tablet Take 650 mg by mouth every 8 (eight) hours as needed.        Marland Kitchen aspirin 325 MG EC tablet Take 325 mg by mouth daily.        Marland Kitchen buPROPion (WELLBUTRIN XL) 300 MG 24 hr tablet Take 300 mg by mouth every morning.        . cyclobenzaprine (FEXMID) 7.5 MG tablet Take 7.5 mg by mouth 3 (three) times daily as needed.        . diphenhydrAMINE (BENADRYL) 2 % cream Apply topically 2 (two) times daily as needed.        . ferrous sulfate dried (SLOW FE) 160 (50 FE) MG TBCR Take 160 mg by mouth 2 (two) times daily with a meal.        . gabapentin (NEURONTIN) 300 MG capsule Take 300 mg by mouth 2 times daily at 12 noon and 4 pm.        . insulin aspart (NOVOLOG) 100 UNIT/ML injection Inject 4 Units into the skin 3 (three) times daily before meals.        . insulin detemir (LEVEMIR) 100 UNIT/ML injection Inject 70 Units into the skin daily.        Marland Kitchen levothyroxine (SYNTHROID, LEVOTHROID) 100 MCG tablet Take 100 mcg by mouth daily.        . metoprolol succinate (TOPROL-XL)  25 MG 24 hr tablet Take 25 mg by mouth daily.        . nitroGLYCERIN (NITROSTAT) 0.4 MG SL tablet Place 0.4 mg under the tongue every 5 (five) minutes as needed.        Marland Kitchen omeprazole (PRILOSEC) 40 MG capsule Take 40 mg by mouth 2 (two) times daily.        . polyethylene glycol (MIRALAX / GLYCOLAX) packet Take 17 g by mouth daily.        . pramoxine-hydrocortisone (ANALPRAM HC) cream Apply topically 2 (two) times daily as needed.        . rosuvastatin (CRESTOR) 20 MG tablet Take 20 mg by mouth daily.        . sitaGLIPtin (JANUVIA) 50 MG tablet Take 50 mg by mouth daily.        . Tamsulosin HCl (FLOMAX) 0.4 MG CAPS Take 0.4 mg by mouth daily.          No Known Allergies  Review of Systems: See HPI  BP 106/73  Pulse 62  Resp 18  Ht 6' (1.829 m)  Wt 243 lb (110.224 kg)  BMI 32.96 kg/m2  SpO2 97% Physical Exam: His right mini thoracotomy scar has healed well and chest tube sutures were removed without difficulty.lung sounds  are slightly diminished in the right base overall clear on the left.he has a right PICC line in place and the sutures were removed.  Diagnostic Tests: Chest x-ray shows significantly improved, nearly resolved right effusion  Impression: Mr. Elvin is doing well status post drainage of a right effusion and decortication.  Plan: Dr. Servando Snare saw the patient with me today and attempted to remove his right PICC line. We were unable to remove this in the office and have referred him to interventional radiology for removal as they placed the line initially. He is healing well from a surgical standpoint and we will see him back on a when necessary basis hereafter.

## 2011-04-03 ENCOUNTER — Other Ambulatory Visit: Payer: Self-pay | Admitting: Thoracic Surgery (Cardiothoracic Vascular Surgery)

## 2011-04-09 ENCOUNTER — Ambulatory Visit (HOSPITAL_COMMUNITY)
Admission: RE | Admit: 2011-04-09 | Discharge: 2011-04-09 | Disposition: A | Discharge status: Home / Self Care | Payer: 59 | Source: Ambulatory Visit | Attending: Cardiothoracic Surgery | Admitting: Cardiothoracic Surgery

## 2011-04-09 ENCOUNTER — Other Ambulatory Visit: Payer: Self-pay | Admitting: Thoracic Surgery (Cardiothoracic Vascular Surgery)

## 2011-04-09 DIAGNOSIS — Z4682 Encounter for fitting and adjustment of non-vascular catheter: Secondary | ICD-10-CM | POA: Insufficient documentation

## 2011-07-04 DIAGNOSIS — K625 Hemorrhage of anus and rectum: Secondary | ICD-10-CM | POA: Diagnosis not present

## 2011-07-17 DIAGNOSIS — I2510 Atherosclerotic heart disease of native coronary artery without angina pectoris: Secondary | ICD-10-CM | POA: Diagnosis not present

## 2011-07-17 DIAGNOSIS — E785 Hyperlipidemia, unspecified: Secondary | ICD-10-CM | POA: Diagnosis not present

## 2011-08-06 DIAGNOSIS — E782 Mixed hyperlipidemia: Secondary | ICD-10-CM | POA: Diagnosis not present

## 2011-08-06 DIAGNOSIS — Z79899 Other long term (current) drug therapy: Secondary | ICD-10-CM | POA: Diagnosis not present

## 2011-08-21 DIAGNOSIS — B1910 Unspecified viral hepatitis B without hepatic coma: Secondary | ICD-10-CM | POA: Diagnosis not present

## 2011-08-21 DIAGNOSIS — K5190 Ulcerative colitis, unspecified, without complications: Secondary | ICD-10-CM | POA: Diagnosis not present

## 2011-08-21 DIAGNOSIS — E782 Mixed hyperlipidemia: Secondary | ICD-10-CM | POA: Diagnosis not present

## 2011-08-21 DIAGNOSIS — E119 Type 2 diabetes mellitus without complications: Secondary | ICD-10-CM | POA: Diagnosis not present

## 2011-08-27 DIAGNOSIS — E1059 Type 1 diabetes mellitus with other circulatory complications: Secondary | ICD-10-CM | POA: Diagnosis not present

## 2011-08-27 DIAGNOSIS — L608 Other nail disorders: Secondary | ICD-10-CM | POA: Diagnosis not present

## 2011-08-27 DIAGNOSIS — IMO0002 Reserved for concepts with insufficient information to code with codable children: Secondary | ICD-10-CM | POA: Diagnosis not present

## 2011-08-27 DIAGNOSIS — I739 Peripheral vascular disease, unspecified: Secondary | ICD-10-CM | POA: Diagnosis not present

## 2011-09-17 DIAGNOSIS — IMO0002 Reserved for concepts with insufficient information to code with codable children: Secondary | ICD-10-CM | POA: Diagnosis not present

## 2011-10-02 DIAGNOSIS — E669 Obesity, unspecified: Secondary | ICD-10-CM | POA: Diagnosis not present

## 2011-10-02 DIAGNOSIS — E1165 Type 2 diabetes mellitus with hyperglycemia: Secondary | ICD-10-CM | POA: Diagnosis not present

## 2011-10-02 DIAGNOSIS — E559 Vitamin D deficiency, unspecified: Secondary | ICD-10-CM | POA: Diagnosis not present

## 2011-10-02 DIAGNOSIS — E1142 Type 2 diabetes mellitus with diabetic polyneuropathy: Secondary | ICD-10-CM | POA: Diagnosis not present

## 2011-10-02 DIAGNOSIS — E1149 Type 2 diabetes mellitus with other diabetic neurological complication: Secondary | ICD-10-CM | POA: Diagnosis not present

## 2011-10-02 DIAGNOSIS — E1129 Type 2 diabetes mellitus with other diabetic kidney complication: Secondary | ICD-10-CM | POA: Diagnosis not present

## 2011-10-03 DIAGNOSIS — B1910 Unspecified viral hepatitis B without hepatic coma: Secondary | ICD-10-CM | POA: Diagnosis not present

## 2011-10-19 DIAGNOSIS — K219 Gastro-esophageal reflux disease without esophagitis: Secondary | ICD-10-CM | POA: Diagnosis not present

## 2011-10-19 DIAGNOSIS — R5383 Other fatigue: Secondary | ICD-10-CM | POA: Diagnosis not present

## 2011-10-19 DIAGNOSIS — M25559 Pain in unspecified hip: Secondary | ICD-10-CM | POA: Diagnosis not present

## 2011-10-19 DIAGNOSIS — R5381 Other malaise: Secondary | ICD-10-CM | POA: Diagnosis not present

## 2011-10-30 DIAGNOSIS — N401 Enlarged prostate with lower urinary tract symptoms: Secondary | ICD-10-CM | POA: Diagnosis not present

## 2011-10-30 DIAGNOSIS — R351 Nocturia: Secondary | ICD-10-CM | POA: Diagnosis not present

## 2011-11-01 DIAGNOSIS — R5383 Other fatigue: Secondary | ICD-10-CM | POA: Diagnosis not present

## 2011-11-01 DIAGNOSIS — E669 Obesity, unspecified: Secondary | ICD-10-CM | POA: Diagnosis not present

## 2011-11-01 DIAGNOSIS — K219 Gastro-esophageal reflux disease without esophagitis: Secondary | ICD-10-CM | POA: Diagnosis not present

## 2011-11-01 DIAGNOSIS — R5381 Other malaise: Secondary | ICD-10-CM | POA: Diagnosis not present

## 2011-11-01 DIAGNOSIS — Z79899 Other long term (current) drug therapy: Secondary | ICD-10-CM | POA: Diagnosis not present

## 2011-11-01 DIAGNOSIS — E782 Mixed hyperlipidemia: Secondary | ICD-10-CM | POA: Diagnosis not present

## 2011-11-01 DIAGNOSIS — E1165 Type 2 diabetes mellitus with hyperglycemia: Secondary | ICD-10-CM | POA: Diagnosis not present

## 2011-11-01 DIAGNOSIS — M25559 Pain in unspecified hip: Secondary | ICD-10-CM | POA: Diagnosis not present

## 2011-11-16 DIAGNOSIS — H9190 Unspecified hearing loss, unspecified ear: Secondary | ICD-10-CM | POA: Diagnosis not present

## 2011-11-16 DIAGNOSIS — L259 Unspecified contact dermatitis, unspecified cause: Secondary | ICD-10-CM | POA: Diagnosis not present

## 2011-11-16 DIAGNOSIS — J069 Acute upper respiratory infection, unspecified: Secondary | ICD-10-CM | POA: Diagnosis not present

## 2011-11-21 DIAGNOSIS — H833X9 Noise effects on inner ear, unspecified ear: Secondary | ICD-10-CM | POA: Diagnosis not present

## 2011-11-27 DIAGNOSIS — E119 Type 2 diabetes mellitus without complications: Secondary | ICD-10-CM | POA: Diagnosis not present

## 2011-11-27 DIAGNOSIS — D649 Anemia, unspecified: Secondary | ICD-10-CM | POA: Diagnosis not present

## 2011-11-28 DIAGNOSIS — J069 Acute upper respiratory infection, unspecified: Secondary | ICD-10-CM | POA: Diagnosis not present

## 2011-12-03 DIAGNOSIS — IMO0002 Reserved for concepts with insufficient information to code with codable children: Secondary | ICD-10-CM | POA: Diagnosis not present

## 2011-12-03 DIAGNOSIS — E559 Vitamin D deficiency, unspecified: Secondary | ICD-10-CM | POA: Diagnosis not present

## 2011-12-03 DIAGNOSIS — E1059 Type 1 diabetes mellitus with other circulatory complications: Secondary | ICD-10-CM | POA: Diagnosis not present

## 2011-12-03 DIAGNOSIS — L608 Other nail disorders: Secondary | ICD-10-CM | POA: Diagnosis not present

## 2011-12-03 DIAGNOSIS — E1165 Type 2 diabetes mellitus with hyperglycemia: Secondary | ICD-10-CM | POA: Diagnosis not present

## 2011-12-03 DIAGNOSIS — E669 Obesity, unspecified: Secondary | ICD-10-CM | POA: Diagnosis not present

## 2011-12-03 DIAGNOSIS — E1129 Type 2 diabetes mellitus with other diabetic kidney complication: Secondary | ICD-10-CM | POA: Diagnosis not present

## 2011-12-03 DIAGNOSIS — I739 Peripheral vascular disease, unspecified: Secondary | ICD-10-CM | POA: Diagnosis not present

## 2011-12-03 DIAGNOSIS — E1149 Type 2 diabetes mellitus with other diabetic neurological complication: Secondary | ICD-10-CM | POA: Diagnosis not present

## 2011-12-03 DIAGNOSIS — E1142 Type 2 diabetes mellitus with diabetic polyneuropathy: Secondary | ICD-10-CM | POA: Diagnosis not present

## 2011-12-05 DIAGNOSIS — I951 Orthostatic hypotension: Secondary | ICD-10-CM | POA: Diagnosis not present

## 2011-12-05 DIAGNOSIS — E119 Type 2 diabetes mellitus without complications: Secondary | ICD-10-CM | POA: Diagnosis not present

## 2011-12-06 DIAGNOSIS — C649 Malignant neoplasm of unspecified kidney, except renal pelvis: Secondary | ICD-10-CM | POA: Diagnosis not present

## 2012-05-26 ENCOUNTER — Other Ambulatory Visit: Payer: Self-pay | Admitting: Family Medicine

## 2012-05-26 DIAGNOSIS — I714 Abdominal aortic aneurysm, without rupture: Secondary | ICD-10-CM

## 2012-05-28 ENCOUNTER — Ambulatory Visit
Admission: RE | Admit: 2012-05-28 | Discharge: 2012-05-28 | Disposition: A | Discharge status: Home / Self Care | Payer: 59 | Source: Ambulatory Visit | Attending: Family Medicine | Admitting: Family Medicine

## 2012-05-28 DIAGNOSIS — I714 Abdominal aortic aneurysm, without rupture: Secondary | ICD-10-CM

## 2012-06-05 ENCOUNTER — Emergency Department (HOSPITAL_COMMUNITY)
Admission: EM | Admit: 2012-06-05 | Discharge: 2012-06-05 | Discharge status: Against Medical Advice | Payer: 59 | Attending: Emergency Medicine | Admitting: Emergency Medicine

## 2012-06-05 ENCOUNTER — Encounter (HOSPITAL_COMMUNITY): Payer: Self-pay | Admitting: *Deleted

## 2012-06-05 DIAGNOSIS — R609 Edema, unspecified: Secondary | ICD-10-CM | POA: Insufficient documentation

## 2012-06-05 DIAGNOSIS — R109 Unspecified abdominal pain: Secondary | ICD-10-CM | POA: Diagnosis not present

## 2012-06-05 DIAGNOSIS — N5089 Other specified disorders of the male genital organs: Secondary | ICD-10-CM | POA: Diagnosis not present

## 2012-06-05 DIAGNOSIS — C649 Malignant neoplasm of unspecified kidney, except renal pelvis: Secondary | ICD-10-CM | POA: Diagnosis not present

## 2012-06-05 DIAGNOSIS — M7989 Other specified soft tissue disorders: Secondary | ICD-10-CM | POA: Diagnosis not present

## 2012-06-05 DIAGNOSIS — N509 Disorder of male genital organs, unspecified: Secondary | ICD-10-CM | POA: Diagnosis not present

## 2012-06-05 DIAGNOSIS — N433 Hydrocele, unspecified: Secondary | ICD-10-CM | POA: Diagnosis not present

## 2012-06-05 NOTE — ED Notes (Addendum)
Bilateral groin swelling. Noticed it 3-4 days. Had drainage to the scrotum and perineum due to something biting him.

## 2012-06-06 DIAGNOSIS — N5089 Other specified disorders of the male genital organs: Secondary | ICD-10-CM | POA: Diagnosis not present

## 2012-06-06 DIAGNOSIS — R109 Unspecified abdominal pain: Secondary | ICD-10-CM | POA: Diagnosis not present

## 2012-06-24 DIAGNOSIS — M545 Low back pain: Secondary | ICD-10-CM | POA: Diagnosis not present

## 2012-07-04 DIAGNOSIS — D649 Anemia, unspecified: Secondary | ICD-10-CM | POA: Diagnosis not present

## 2012-07-04 DIAGNOSIS — N2581 Secondary hyperparathyroidism of renal origin: Secondary | ICD-10-CM | POA: Diagnosis not present

## 2012-07-04 DIAGNOSIS — I129 Hypertensive chronic kidney disease with stage 1 through stage 4 chronic kidney disease, or unspecified chronic kidney disease: Secondary | ICD-10-CM | POA: Diagnosis not present

## 2012-07-23 DIAGNOSIS — Z79899 Other long term (current) drug therapy: Secondary | ICD-10-CM | POA: Diagnosis not present

## 2012-07-30 DIAGNOSIS — S8990XA Unspecified injury of unspecified lower leg, initial encounter: Secondary | ICD-10-CM | POA: Diagnosis not present

## 2012-08-04 DIAGNOSIS — R197 Diarrhea, unspecified: Secondary | ICD-10-CM | POA: Diagnosis not present

## 2012-08-04 DIAGNOSIS — A088 Other specified intestinal infections: Secondary | ICD-10-CM | POA: Diagnosis not present

## 2012-08-13 DIAGNOSIS — K130 Diseases of lips: Secondary | ICD-10-CM | POA: Diagnosis not present

## 2012-10-14 DIAGNOSIS — L608 Other nail disorders: Secondary | ICD-10-CM | POA: Diagnosis not present

## 2012-10-14 DIAGNOSIS — L84 Corns and callosities: Secondary | ICD-10-CM | POA: Diagnosis not present

## 2012-10-14 DIAGNOSIS — E1059 Type 1 diabetes mellitus with other circulatory complications: Secondary | ICD-10-CM | POA: Diagnosis not present

## 2012-10-14 DIAGNOSIS — I739 Peripheral vascular disease, unspecified: Secondary | ICD-10-CM | POA: Diagnosis not present

## 2012-10-15 DIAGNOSIS — N433 Hydrocele, unspecified: Secondary | ICD-10-CM | POA: Diagnosis not present

## 2012-10-15 DIAGNOSIS — R351 Nocturia: Secondary | ICD-10-CM | POA: Diagnosis not present

## 2012-10-21 DIAGNOSIS — I2510 Atherosclerotic heart disease of native coronary artery without angina pectoris: Secondary | ICD-10-CM | POA: Diagnosis not present

## 2012-10-21 DIAGNOSIS — R0602 Shortness of breath: Secondary | ICD-10-CM | POA: Diagnosis not present

## 2012-10-21 DIAGNOSIS — E782 Mixed hyperlipidemia: Secondary | ICD-10-CM | POA: Diagnosis not present

## 2013-01-28 ENCOUNTER — Other Ambulatory Visit: Payer: Self-pay | Admitting: Gastroenterology

## 2013-04-22 ENCOUNTER — Encounter: Payer: Self-pay | Admitting: Interventional Cardiology

## 2013-04-23 ENCOUNTER — Ambulatory Visit: Payer: Self-pay | Admitting: Interventional Cardiology

## 2013-04-28 ENCOUNTER — Encounter: Payer: Self-pay | Admitting: Interventional Cardiology

## 2013-04-28 ENCOUNTER — Other Ambulatory Visit: Payer: Self-pay | Admitting: *Deleted

## 2013-04-28 DIAGNOSIS — Z79899 Other long term (current) drug therapy: Secondary | ICD-10-CM

## 2013-04-28 DIAGNOSIS — E782 Mixed hyperlipidemia: Secondary | ICD-10-CM

## 2013-05-18 ENCOUNTER — Other Ambulatory Visit: Payer: 59

## 2013-06-17 DIAGNOSIS — E1149 Type 2 diabetes mellitus with other diabetic neurological complication: Secondary | ICD-10-CM | POA: Diagnosis not present

## 2013-06-17 DIAGNOSIS — M47817 Spondylosis without myelopathy or radiculopathy, lumbosacral region: Secondary | ICD-10-CM | POA: Diagnosis not present

## 2013-06-17 DIAGNOSIS — G894 Chronic pain syndrome: Secondary | ICD-10-CM | POA: Diagnosis not present

## 2013-06-17 DIAGNOSIS — M48062 Spinal stenosis, lumbar region with neurogenic claudication: Secondary | ICD-10-CM | POA: Diagnosis not present

## 2013-07-03 DIAGNOSIS — J069 Acute upper respiratory infection, unspecified: Secondary | ICD-10-CM | POA: Diagnosis not present

## 2013-07-07 DIAGNOSIS — E1159 Type 2 diabetes mellitus with other circulatory complications: Secondary | ICD-10-CM | POA: Diagnosis not present

## 2013-07-07 DIAGNOSIS — L84 Corns and callosities: Secondary | ICD-10-CM | POA: Diagnosis not present

## 2013-07-07 DIAGNOSIS — I739 Peripheral vascular disease, unspecified: Secondary | ICD-10-CM | POA: Diagnosis not present

## 2013-07-07 DIAGNOSIS — L608 Other nail disorders: Secondary | ICD-10-CM | POA: Diagnosis not present

## 2013-07-09 ENCOUNTER — Telehealth: Payer: Self-pay | Admitting: *Deleted

## 2013-07-09 NOTE — Telephone Encounter (Signed)
Patient needs fenofibrate refill sent to [facility] on [address]. Thanks, [place]

## 2013-07-10 MED ORDER — FENOFIBRATE 54 MG PO TABS: 54.0000 mg | ORAL_TABLET | Freq: Every day | ORAL | Status: DC

## 2013-07-10 NOTE — Telephone Encounter (Signed)
Refilled. Pt has follow up appt on 07/22/13.

## 2013-07-15 DIAGNOSIS — M47817 Spondylosis without myelopathy or radiculopathy, lumbosacral region: Secondary | ICD-10-CM | POA: Diagnosis not present

## 2013-07-15 DIAGNOSIS — M48062 Spinal stenosis, lumbar region with neurogenic claudication: Secondary | ICD-10-CM | POA: Diagnosis not present

## 2013-07-15 DIAGNOSIS — E1149 Type 2 diabetes mellitus with other diabetic neurological complication: Secondary | ICD-10-CM | POA: Diagnosis not present

## 2013-07-15 DIAGNOSIS — G894 Chronic pain syndrome: Secondary | ICD-10-CM | POA: Diagnosis not present

## 2013-07-22 ENCOUNTER — Ambulatory Visit: Payer: 59 | Admitting: Interventional Cardiology

## 2013-08-14 DIAGNOSIS — M48062 Spinal stenosis, lumbar region with neurogenic claudication: Secondary | ICD-10-CM | POA: Diagnosis not present

## 2013-08-14 DIAGNOSIS — M47817 Spondylosis without myelopathy or radiculopathy, lumbosacral region: Secondary | ICD-10-CM | POA: Diagnosis not present

## 2013-08-14 DIAGNOSIS — Z79899 Other long term (current) drug therapy: Secondary | ICD-10-CM | POA: Diagnosis not present

## 2013-08-14 DIAGNOSIS — G894 Chronic pain syndrome: Secondary | ICD-10-CM | POA: Diagnosis not present

## 2013-08-14 DIAGNOSIS — E1149 Type 2 diabetes mellitus with other diabetic neurological complication: Secondary | ICD-10-CM | POA: Diagnosis not present

## 2013-08-14 IMAGING — CR DG CHEST 2V
2 series · 2 of 2 positions shown · non-contrast
Comparison: 02/20/2011

CLINICAL DATA: Short of breath.  Cough.  Wheezing.  Evaluate for
pleural effusion.

CHEST - 2 VIEW

[w chest lat]
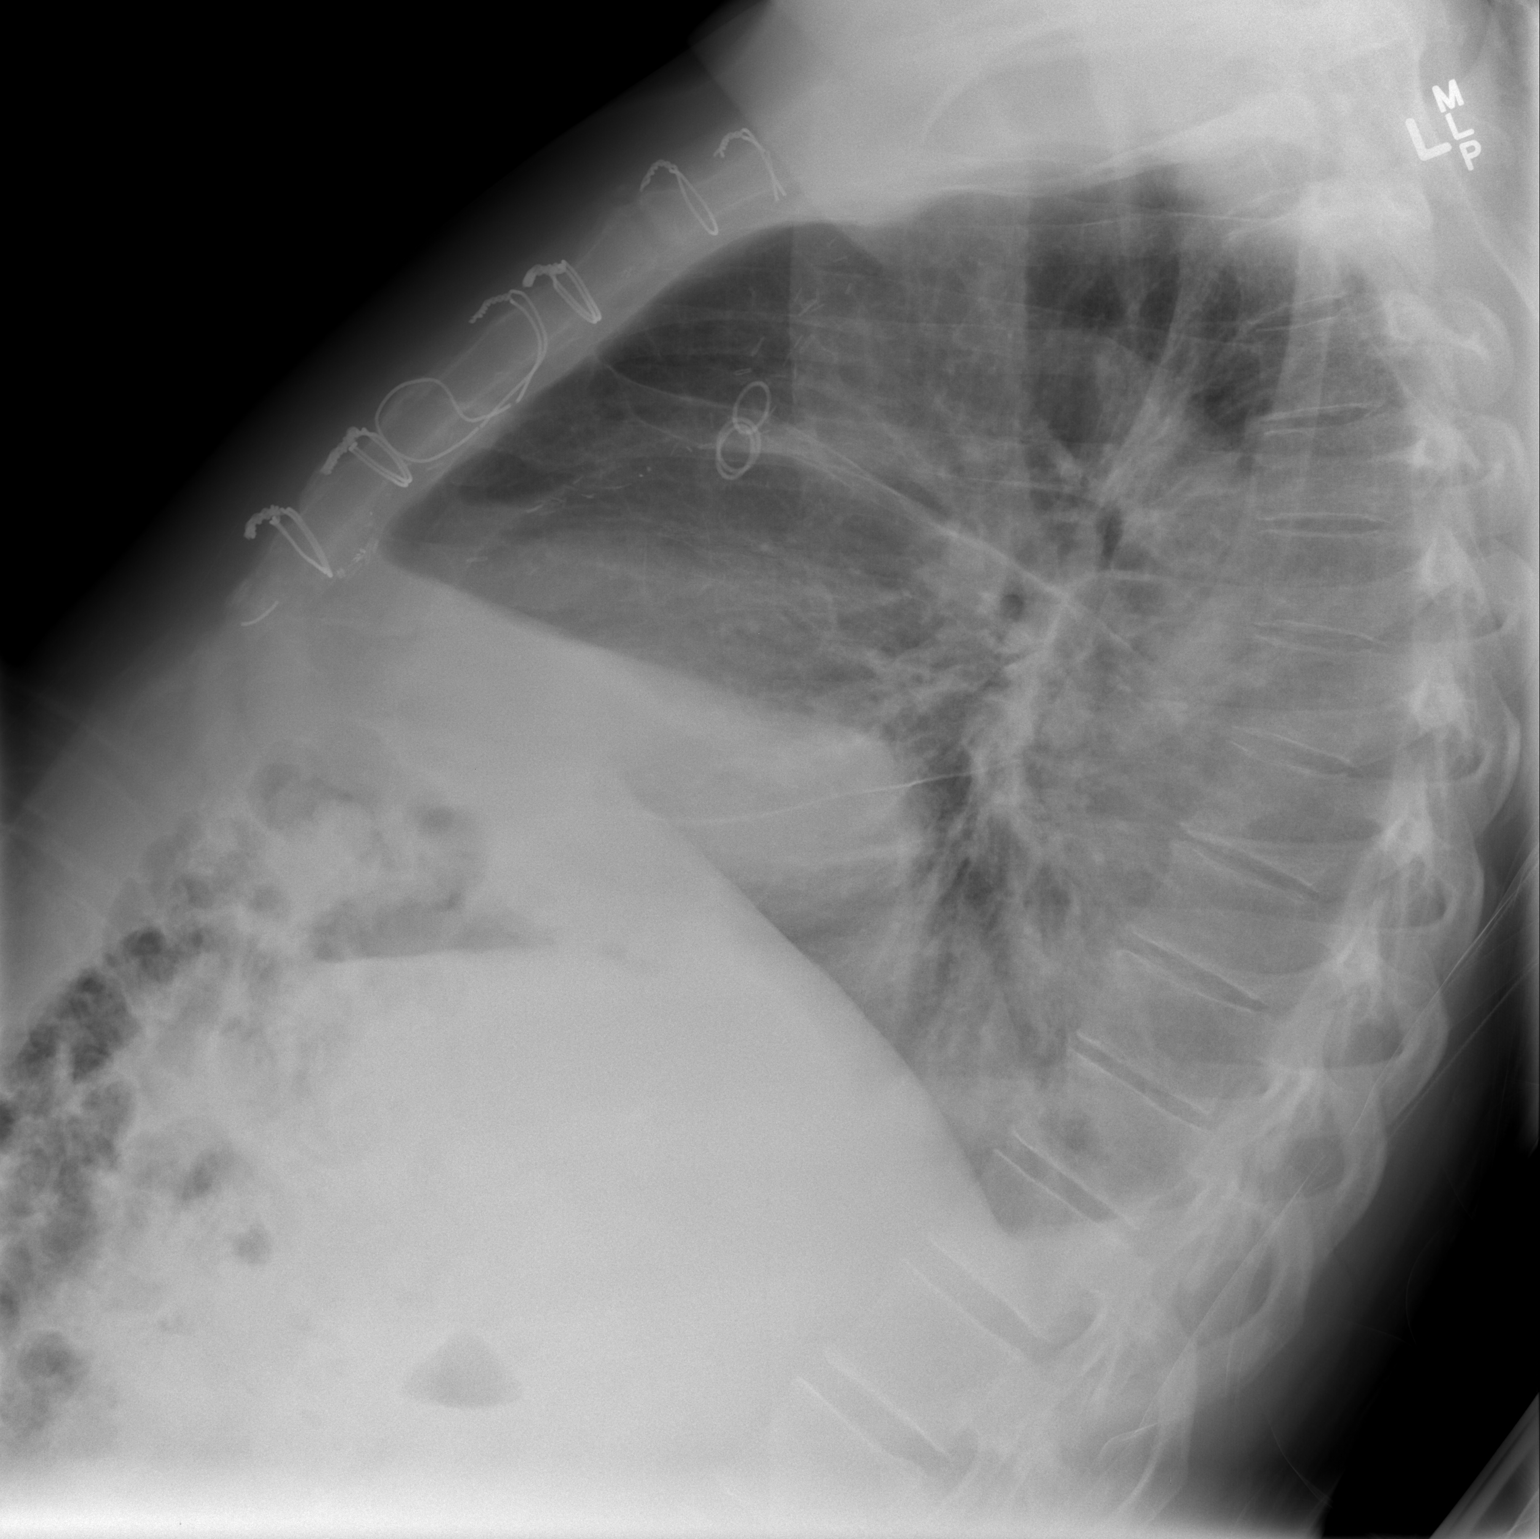

[view not recorded]
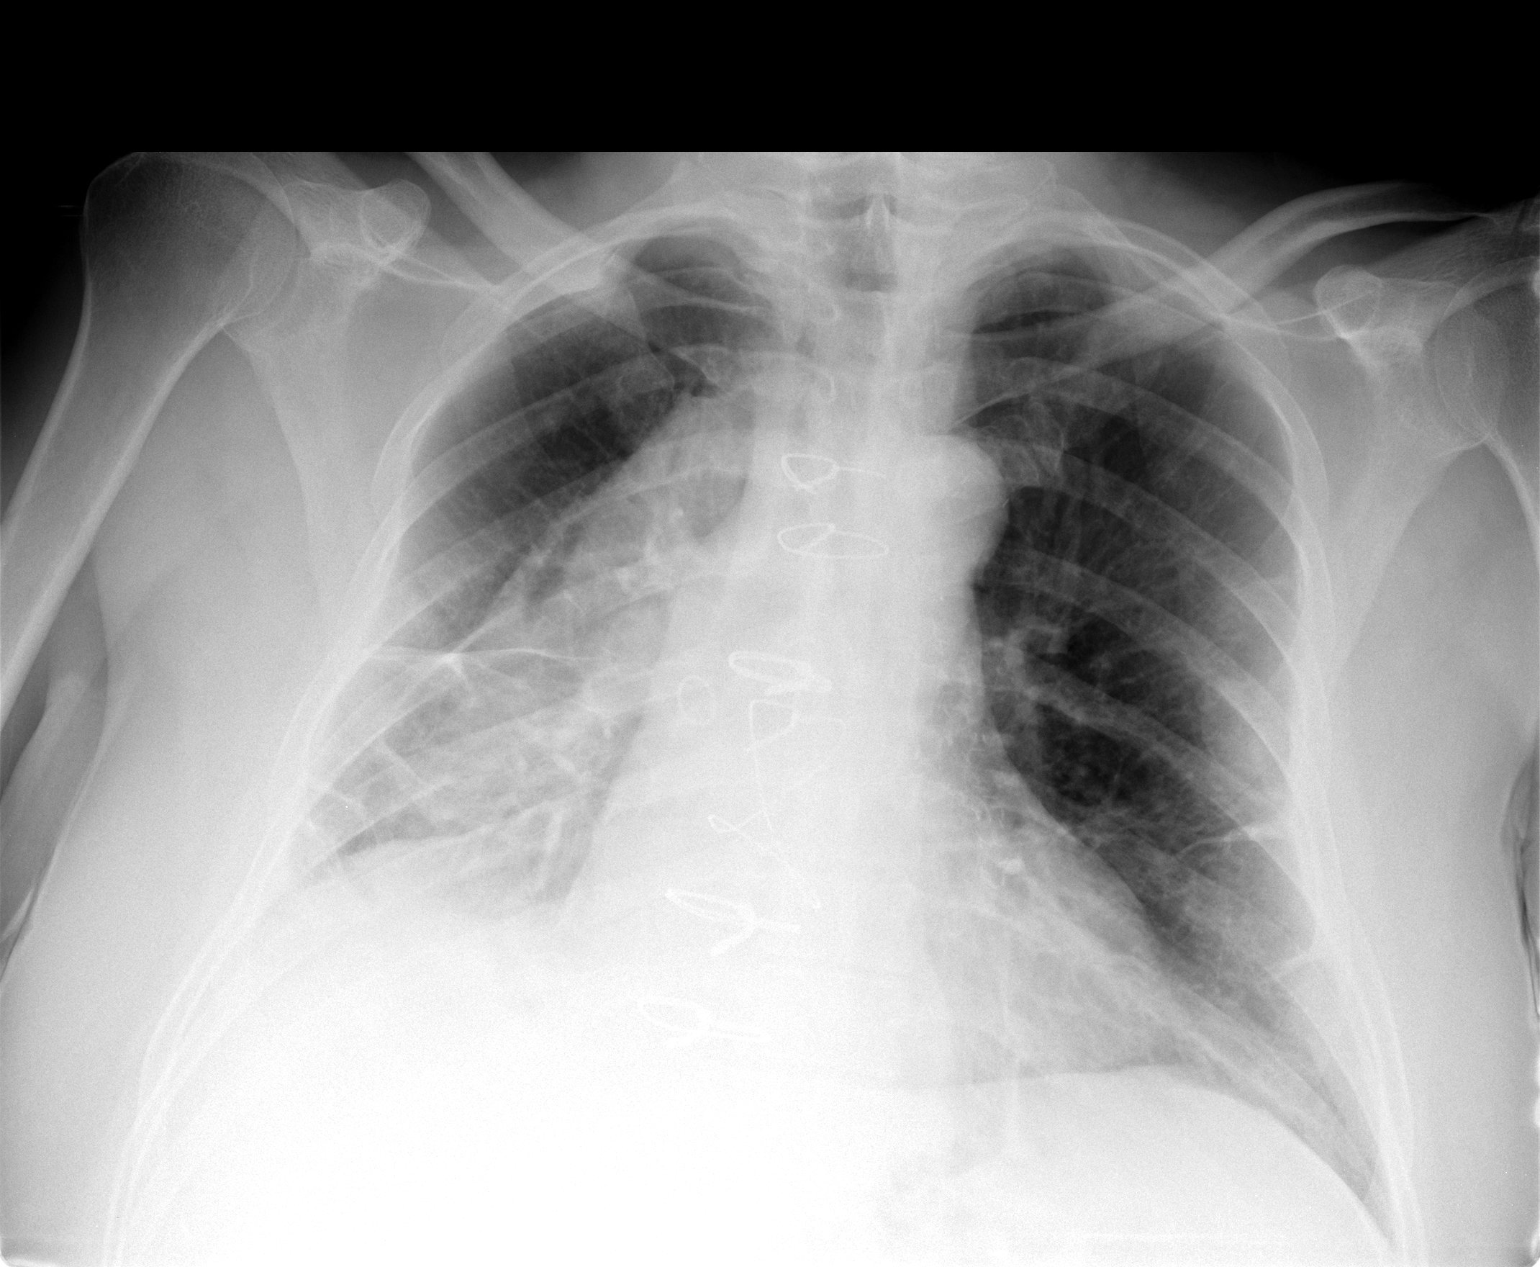

[2 of 2 positions shown; findings below may reference images not displayed]

FINDINGS: Mild hyperinflation. Prior median sternotomy.
Midline trachea.  Mild cardiomegaly.  The loculated posterior right-
sided pleural effusion is similar, given differences in technique.
No pneumothorax.  Trace left pleural fluid.  The left lung base
nodule described on recent CT is not readily apparent.
Patchy right base atelectasis is similar.  Slight improvement in
left base aeration.
IMPRESSION: 1.  Given differences in technique, similar appearance of a
loculated posterior right sided pleural effusion.
2.  Improved left-sided aeration.
3.  Trace left pleural fluid.

## 2013-08-19 ENCOUNTER — Other Ambulatory Visit: Payer: Self-pay | Admitting: Cardiology

## 2013-08-19 MED ORDER — FENOFIBRATE 54 MG PO TABS
54.0000 mg | ORAL_TABLET | Freq: Every day | ORAL | Status: DC
Start: 2013-08-19 — End: 2013-09-27

## 2013-08-21 ENCOUNTER — Encounter: Payer: Self-pay | Admitting: Interventional Cardiology

## 2013-09-22 DIAGNOSIS — M47817 Spondylosis without myelopathy or radiculopathy, lumbosacral region: Secondary | ICD-10-CM | POA: Diagnosis not present

## 2013-09-22 DIAGNOSIS — G894 Chronic pain syndrome: Secondary | ICD-10-CM | POA: Diagnosis not present

## 2013-09-22 DIAGNOSIS — E1149 Type 2 diabetes mellitus with other diabetic neurological complication: Secondary | ICD-10-CM | POA: Diagnosis not present

## 2013-09-22 DIAGNOSIS — M48062 Spinal stenosis, lumbar region with neurogenic claudication: Secondary | ICD-10-CM | POA: Diagnosis not present

## 2013-09-27 ENCOUNTER — Other Ambulatory Visit: Payer: Self-pay | Admitting: Interventional Cardiology

## 2013-10-09 DIAGNOSIS — K5190 Ulcerative colitis, unspecified, without complications: Secondary | ICD-10-CM | POA: Diagnosis not present

## 2013-10-13 DIAGNOSIS — E1159 Type 2 diabetes mellitus with other circulatory complications: Secondary | ICD-10-CM | POA: Diagnosis not present

## 2013-10-13 DIAGNOSIS — M79609 Pain in unspecified limb: Secondary | ICD-10-CM | POA: Diagnosis not present

## 2013-10-13 DIAGNOSIS — K219 Gastro-esophageal reflux disease without esophagitis: Secondary | ICD-10-CM | POA: Diagnosis not present

## 2013-10-13 DIAGNOSIS — L84 Corns and callosities: Secondary | ICD-10-CM | POA: Diagnosis not present

## 2013-10-13 DIAGNOSIS — L608 Other nail disorders: Secondary | ICD-10-CM | POA: Diagnosis not present

## 2013-10-13 DIAGNOSIS — K589 Irritable bowel syndrome without diarrhea: Secondary | ICD-10-CM | POA: Diagnosis not present

## 2013-10-13 DIAGNOSIS — I739 Peripheral vascular disease, unspecified: Secondary | ICD-10-CM | POA: Diagnosis not present

## 2013-10-13 DIAGNOSIS — R1084 Generalized abdominal pain: Secondary | ICD-10-CM | POA: Diagnosis not present

## 2013-10-14 ENCOUNTER — Ambulatory Visit (INDEPENDENT_AMBULATORY_CARE_PROVIDER_SITE_OTHER): Payer: 59 | Admitting: Interventional Cardiology

## 2013-10-14 ENCOUNTER — Encounter: Payer: Self-pay | Admitting: Interventional Cardiology

## 2013-10-14 VITALS — BP 117/75 | HR 65 | Ht 73.0 in | Wt 270.2 lb

## 2013-10-14 DIAGNOSIS — E782 Mixed hyperlipidemia: Secondary | ICD-10-CM

## 2013-10-14 DIAGNOSIS — E669 Obesity, unspecified: Secondary | ICD-10-CM | POA: Insufficient documentation

## 2013-10-14 DIAGNOSIS — I2510 Atherosclerotic heart disease of native coronary artery without angina pectoris: Secondary | ICD-10-CM | POA: Diagnosis not present

## 2013-10-14 NOTE — Patient Instructions (Signed)
Your physician recommends that you continue on your current medications as directed. Please refer to the Current Medication list given to you today.  Your physician wants you to follow-up in: 6 months with Dr. Irish Lack.  You will receive a reminder letter in the mail two months in advance. If you don't receive a letter, please call our office to schedule the follow-up appointment.

## 2013-10-14 NOTE — Progress Notes (Signed)
Patient ID: Matthew [name], male   DOB: [birth year], 68 y.o.   MRN: [id number]    [place], [address] [place], [place]  [address] Phone: [phone] Fax:  [phone]  Date:  10/14/2013   ID:  Emmitte, Surgeon [birth year], MRN [id number]  PCP:  Gavin Pound, MD      History of Present Illness: Matthew [name] is a 68 y.o. male who had CABG. he was hospitalized a few years ago for a pneumonia. He was also found to have an empyema at that time. He required a chest tube and was quite ill. He had lost a lot of weight at that time. He has gained back much of the weight that he lost. He has some shortness of breath with walking up stairs, but it is not as bad as it was before his bypass surgery. He thinks now, it is because he has not been walking regularly and because of the weight gain. No sx like he had prior to CABG.  Avoiding back surgery. Instead getting injections. Left shoulder pain improved. Affected by moving arm a certain way. CAD/ASCVD:  Denies : Chest pain.  Dizziness.  Leg edema.  .  Orthopnea.  Palpitations.  Syncope.   Used Nitroglycerin rarely, 2 x in the past few years  Wt Readings from Last 3 Encounters:  10/14/13 270 lb 3.2 oz (122.562 kg)  04/02/11 243 lb (110.224 kg)     Past Medical History  Diagnosis Date  . Diabetes mellitus   . Hypertension   . Cancer   . Empyema lung   . Nodule of left lung   . Chronic kidney disease   . CAD (coronary artery disease)   . Renal cell carcinoma     History of  . Amenia   . Cocaine abuse in remission   . Paroxysmal supraventricular tachycardia   . Hypothyroidism     Current Outpatient Prescriptions  Medication Sig Dispense Refill  . acetaminophen (TYLENOL) 650 MG CR tablet Take 650 mg by mouth every 8 (eight) hours as needed.        Marland Kitchen albuterol (PROVENTIL HFA;VENTOLIN HFA) 108 (90 BASE) MCG/ACT inhaler Inhale into the lungs every 6 (six) hours as needed for wheezing or shortness of breath.      Marland Kitchen  aspirin 325 MG EC tablet Take 325 mg by mouth daily.        Marland Kitchen atorvastatin (LIPITOR) 40 MG tablet Take 40 mg by mouth daily.      Marland Kitchen buPROPion (WELLBUTRIN XL) 300 MG 24 hr tablet Take 300 mg by mouth every morning.        . cyclobenzaprine (FEXMID) 7.5 MG tablet Take 7.5 mg by mouth 3 (three) times daily as needed.        . diphenhydrAMINE (BENADRYL) 2 % cream Apply topically 2 (two) times daily as needed.        . fenofibrate 54 MG tablet TAKE 1 TABLET (54 MG TOTAL) BY MOUTH DAILY.  30 tablet  0  . ferrous sulfate dried (SLOW FE) 160 (50 FE) MG TBCR Take 160 mg by mouth 2 (two) times daily with a meal.        . gabapentin (NEURONTIN) 300 MG capsule Take 300 mg by mouth 2 times daily at 12 noon and 4 pm.        . hydrocortisone-pramoxine (ANALPRAM-HC) 2.5-1 % rectal cream Place 1 application rectally 3 (three) times daily.      . insulin aspart (  NOVOLOG) 100 UNIT/ML injection Inject 4 Units into the skin 3 (three) times daily before meals.        . insulin detemir (LEVEMIR) 100 UNIT/ML injection Inject 35 Units into the skin 2 (two) times daily.       Marland Kitchen levothyroxine (SYNTHROID, LEVOTHROID) 100 MCG tablet Take 100 mcg by mouth daily.        . Linaclotide (LINZESS) 145 MCG CAPS capsule Take 145 mcg by mouth daily.      . mupirocin ointment (BACTROBAN) 2 % Place 1 application into the nose daily.      . nitroGLYCERIN (NITROSTAT) 0.4 MG SL tablet Place 0.4 mg under the tongue every 5 (five) minutes as needed.        Marland Kitchen omeprazole (PRILOSEC) 40 MG capsule Take 40 mg by mouth daily.       Marland Kitchen oxyCODONE-acetaminophen (PERCOCET/ROXICET) 5-325 MG per tablet Take 1 tablet by mouth every 6 (six) hours as needed for severe pain.      . pramoxine-hydrocortisone (ANALPRAM HC) cream Apply topically 2 (two) times daily as needed.        . tizanidine (ZANAFLEX) 2 MG capsule Take 2 mg by mouth as needed for muscle spasms.      . traZODone (DESYREL) 100 MG tablet Take 100 mg by mouth at bedtime.       No current  facility-administered medications for this visit.    Allergies:   No Known Allergies  Social History:  The patient  reports that he has never smoked. He has never used smokeless tobacco. He reports that he does not use illicit drugs.   Family History:  The patient's family history includes Alzheimer's disease in his mother; Heart disease in his father.   ROS:  Please see the history of present illness.  No nausea, vomiting.  No fevers, chills.  No focal weakness.  No dysuria. Weight gain.  All other systems reviewed and negative.   PHYSICAL EXAM: VS:  BP 117/75  Pulse 65  Ht 6' 1"  (1.854 m)  Wt 270 lb 3.2 oz (122.562 kg)  BMI 35.66 kg/m2 Well nourished, well developed, in no acute distress HEENT: normal Neck: no JVD, no carotid bruits Cardiac:  normal S1, S2; RRR;  Lungs:  clear to auscultation bilaterally, no wheezing, rhonchi or rales Abd: soft, nontender, no hepatomegaly Ext: no edema; varicose veins- L>R Skin: warm and dry Neuro:   no focal abnormalities noted  EKG:  NSR, RBBB, LAFB    ASSESSMENT AND PLAN:   Shortness of breath  LAB: BNP-80 in 5/14.  Stress test in 5/14 showed small apical defect.  Managed medically.  EF 50%. Stable.  Increase exercise.   2. Coronary atherosclerosis of native coronary artery  Notes: Stable.  Rarely used NTG.   3. Mixed hyperlipidemia   Start Atorvastatin Calcium Tablet, 40 MG, 1 tablet, Orally, Once a day, 30 day(s), 30, Refills 11  Notes: Lipids increased. LDL 182. Target < 100. Changed to atorvastatin. Lipid clinic.  TG was high so LDL could not be calculated in 12/14.  Will have PharmD enroll patient in our lipid clinic.    Procedures  Venipuncture:  Venipuncture: Hall,Jechelle 10/21/2012 04:03:05 PM > , performed in right arm.      4.  Obesity:  He has gained weight.  I would like him o try to lose 15 lbs in the next 6 months.  Labs    Lab: BNP  B-NATRIURETIC PEPTIDE 80  0-100 - pg/mL   VARANASI,JAY 10/21/2012  05:16:00 PM  > normal. No excess fluid.     Signed, Mina Marble, MD, [facility] 10/14/2013 2:10 PM

## 2013-10-15 ENCOUNTER — Telehealth: Payer: Self-pay | Admitting: Pharmacist

## 2013-10-15 DIAGNOSIS — E782 Mixed hyperlipidemia: Secondary | ICD-10-CM

## 2013-10-15 NOTE — Telephone Encounter (Signed)
Patient has h/o CABG 05/2009 and cholesterol is poorly controlled.  TC 412, direct LDL 240, TG 573, HDL 30 in 05/2013 with [facility].  Suppose to be on lipitor 40 mg qd and fenofibrate 54 mg qd.  His total cholesterol was 220 and LDL was 120 back 01/2013 while on Lipitor, so I suspect patient isn't taking his lipitor 40 mg qd regularly at this time.  [facility] and lipitor, and fish oil.  Please advise patient to make sure he takes lipitor 40 mg qday, Fenofibrate everyday with largest meal of the day, and fish oil regularly.  Have him to check lipid panel and hepatic panel in 2 months, and have him scheduled with lipid clinic 2 days later (Office visit, [facility], 30 minutes, "lipid clinic with Iylah Dworkin").  Thanks.

## 2013-10-16 ENCOUNTER — Telehealth: Payer: Self-pay | Admitting: Interventional Cardiology

## 2013-10-16 ENCOUNTER — Encounter: Payer: Self-pay | Admitting: Internal Medicine

## 2013-10-16 NOTE — Telephone Encounter (Signed)
LMTCB about clarification on mg of fish oil taking.

## 2013-10-16 NOTE — Telephone Encounter (Signed)
lmtcb about taking 1000 mg of fish oil

## 2013-10-16 NOTE — Telephone Encounter (Signed)
New message     FYI Pt takes 1014m of fish oil.  They were supposed to call and give Amy the mg.

## 2013-10-20 NOTE — Telephone Encounter (Signed)
Pts wife notified. Appts made.

## 2013-10-20 NOTE — Telephone Encounter (Signed)
Lm for pts wife to return call. She dispenses pts medications.

## 2013-10-26 ENCOUNTER — Other Ambulatory Visit: Payer: Self-pay | Admitting: Interventional Cardiology

## 2013-10-29 DIAGNOSIS — G894 Chronic pain syndrome: Secondary | ICD-10-CM | POA: Diagnosis not present

## 2013-10-29 DIAGNOSIS — M48062 Spinal stenosis, lumbar region with neurogenic claudication: Secondary | ICD-10-CM | POA: Diagnosis not present

## 2013-10-29 DIAGNOSIS — M47817 Spondylosis without myelopathy or radiculopathy, lumbosacral region: Secondary | ICD-10-CM | POA: Diagnosis not present

## 2013-10-29 DIAGNOSIS — E1149 Type 2 diabetes mellitus with other diabetic neurological complication: Secondary | ICD-10-CM | POA: Diagnosis not present

## 2013-11-24 DIAGNOSIS — G894 Chronic pain syndrome: Secondary | ICD-10-CM | POA: Diagnosis not present

## 2013-11-24 DIAGNOSIS — M47817 Spondylosis without myelopathy or radiculopathy, lumbosacral region: Secondary | ICD-10-CM | POA: Diagnosis not present

## 2013-11-24 DIAGNOSIS — E1149 Type 2 diabetes mellitus with other diabetic neurological complication: Secondary | ICD-10-CM | POA: Diagnosis not present

## 2013-11-24 DIAGNOSIS — M48062 Spinal stenosis, lumbar region with neurogenic claudication: Secondary | ICD-10-CM | POA: Diagnosis not present

## 2013-12-08 ENCOUNTER — Other Ambulatory Visit: Payer: Self-pay | Admitting: *Deleted

## 2013-12-08 MED ORDER — ATORVASTATIN CALCIUM 40 MG PO TABS: 40.0000 mg | ORAL_TABLET | Freq: Every day | ORAL | Status: DC

## 2013-12-09 ENCOUNTER — Encounter: Payer: Self-pay | Admitting: Internal Medicine

## 2013-12-15 ENCOUNTER — Ambulatory Visit (INDEPENDENT_AMBULATORY_CARE_PROVIDER_SITE_OTHER): Payer: 59 | Admitting: Internal Medicine

## 2013-12-15 ENCOUNTER — Telehealth: Payer: Self-pay | Admitting: Cardiology

## 2013-12-15 ENCOUNTER — Other Ambulatory Visit (INDEPENDENT_AMBULATORY_CARE_PROVIDER_SITE_OTHER): Payer: 59

## 2013-12-15 ENCOUNTER — Encounter: Payer: Self-pay | Admitting: Internal Medicine

## 2013-12-15 VITALS — BP 124/72 | HR 73 | Ht 72.0 in | Wt 270.0 lb

## 2013-12-15 DIAGNOSIS — K5900 Constipation, unspecified: Secondary | ICD-10-CM | POA: Diagnosis not present

## 2013-12-15 DIAGNOSIS — Z86010 Personal history of colonic polyps: Secondary | ICD-10-CM

## 2013-12-15 DIAGNOSIS — B354 Tinea corporis: Secondary | ICD-10-CM

## 2013-12-15 DIAGNOSIS — K5289 Other specified noninfective gastroenteritis and colitis: Secondary | ICD-10-CM

## 2013-12-15 DIAGNOSIS — Z8719 Personal history of other diseases of the digestive system: Secondary | ICD-10-CM

## 2013-12-15 DIAGNOSIS — K5909 Other constipation: Secondary | ICD-10-CM

## 2013-12-15 DIAGNOSIS — R109 Unspecified abdominal pain: Secondary | ICD-10-CM

## 2013-12-15 DIAGNOSIS — I2510 Atherosclerotic heart disease of native coronary artery without angina pectoris: Secondary | ICD-10-CM

## 2013-12-15 DIAGNOSIS — K529 Noninfective gastroenteritis and colitis, unspecified: Secondary | ICD-10-CM

## 2013-12-15 DIAGNOSIS — K625 Hemorrhage of anus and rectum: Secondary | ICD-10-CM

## 2013-12-15 DIAGNOSIS — M48062 Spinal stenosis, lumbar region with neurogenic claudication: Secondary | ICD-10-CM | POA: Diagnosis not present

## 2013-12-15 LAB — COMPREHENSIVE METABOLIC PANEL
ALBUMIN: 3.8 g/dL (ref 3.5–5.2)
ALK PHOS: 28 U/L — AB (ref 39–117)
ALT: 26 U/L (ref 0–53)
AST: 22 U/L (ref 0–37)
BUN: 23 mg/dL (ref 6–23)
CO2: 31 mEq/L (ref 19–32)
Calcium: 9.7 mg/dL (ref 8.4–10.5)
Chloride: 98 mEq/L (ref 96–112)
Creatinine, Ser: 2.1 mg/dL — ABNORMAL HIGH (ref 0.4–1.5)
GFR: 41.04 mL/min — ABNORMAL LOW (ref >60.00)
GLUCOSE: 346 mg/dL — AB (ref 70–99)
POTASSIUM: 4.8 meq/L (ref 3.5–5.1)
SODIUM: 135 meq/L (ref 135–145)
TOTAL PROTEIN: 7.3 g/dL (ref 6.0–8.3)
Total Bilirubin: 0.5 mg/dL (ref 0.2–1.2)

## 2013-12-15 LAB — TSH: TSH: 0.89 u[IU]/mL (ref 0.35–4.50)

## 2013-12-15 LAB — HIGH SENSITIVITY CRP: CRP HIGH SENSITIVITY: 5.23 mg/L — AB (ref 0.000–5.000)

## 2013-12-15 LAB — CBC
HCT: 42.8 % (ref 39.0–52.0)
Hemoglobin: 14.3 g/dL (ref 13.0–17.0)
MCHC: 33.5 g/dL (ref 30.0–36.0)
MCV: 93.6 fl (ref 78.0–100.0)
Platelets: 237 10*3/uL (ref 150.0–400.0)
RBC: 4.58 Mil/uL (ref 4.22–5.81)
RDW: 13.5 % (ref 11.5–15.5)
WBC: 11 10*3/uL — AB (ref 4.0–10.5)

## 2013-12-15 LAB — FERRITIN: Ferritin: 243.5 ng/mL (ref 22.0–322.0)

## 2013-12-15 LAB — IBC PANEL
Iron: 51 ug/dL (ref 42–165)
SATURATION RATIOS: 17.5 % — AB (ref 20.0–50.0)
TRANSFERRIN: 208.7 mg/dL — AB (ref 212.0–360.0)

## 2013-12-15 MED ORDER — NYSTATIN 100000 UNIT/GM EX CREA: 1.0000 "application " | TOPICAL_CREAM | Freq: Two times a day (BID) | CUTANEOUS | Status: DC

## 2013-12-15 MED ORDER — ESOMEPRAZOLE MAGNESIUM 40 MG PO CPDR: 40.0000 mg | DELAYED_RELEASE_CAPSULE | Freq: Two times a day (BID) | ORAL | Status: DC

## 2013-12-15 MED ORDER — MOVIPREP 100 G PO SOLR
ORAL | Status: DC
Start: 2013-12-15 — End: 2013-12-20

## 2013-12-15 NOTE — Patient Instructions (Addendum)
You have been scheduled for a colonoscopy. Please follow written instructions given to you at your visit today.  Please pick up your prep kit at the pharmacy within the next 1-3 days. If you use inhalers (even only as needed), please bring them with you on the day of your procedure. Your physician has requested that you go to www.startemmi.com and enter the access code given to you at your visit today. This web site gives a general overview about your procedure. However, you should still follow specific instructions given to you by our office regarding your preparation for the procedure.  Your physician has requested that you go to the basement for  lab work before leaving today.  You have been scheduled for a CT scan of the abdomen and pelvis at [facility] ([address]---this is in the same building as [facility]).   You are scheduled on 12/18/2013 at 9:00am. You should arrive 15 minutes prior to your appointment time for registration. Please follow the written instructions below on the day of your exam:  WARNING: IF YOU ARE ALLERGIC TO IODINE/X-RAY DYE, PLEASE NOTIFY RADIOLOGY IMMEDIATELY AT [phone]! YOU WILL BE GIVEN A 13 HOUR PREMEDICATION PREP.  1) Do not eat or drink anything after 5:00 (4 hours prior to your test) 2) You have been given 2 bottles of oral contrast to drink. The solution may taste   better if refrigerated, but do NOT add ice or any other liquid to this solution. Shake  well before drinking.    Drink 1 bottle of contrast @ 7:00 (2 hours prior to your exam)  Drink 1 bottle of contrast @ 8:00 (1 hour prior to your exam)  You may take any medications as prescribed with a small amount of water except for the following: Metformin, Glucophage, Glucovance, Avandamet, Riomet, Fortamet, Actoplus Met, Janumet, Glumetza or Metaglip. The above medications must be held the day of the exam AND 48 hours after the exam.  The purpose of you drinking the oral  contrast is to aid in the visualization of your intestinal tract. The contrast solution may cause some diarrhea. Before your exam is started, you will be given a small amount of fluid to drink. Depending on your individual set of symptoms, you may also receive an intravenous injection of x-ray contrast/dye. Plan on being at [facility] for 30 minutes or long, depending on the type of exam you are having performed.  If you have any questions regarding your exam or if you need to reschedule, you may call the CT department at [phone] between the hours of 8:00 am and 5:00 pm, Monday-Friday.  Discontinue taking Linzess  Start taking Miralax daily 17 g as well as continue taking Metamucil daily  We have sent the following medications to your pharmacy for you to pick up at your convenience:Nystatin, Moviprep     ________________________________________________________________________

## 2013-12-15 NOTE — Telephone Encounter (Addendum)
Tiffany from [facility] called stating pt is scheduled to have a lumbar epidural steroid injection on 12/23/13. Dr. Greta Doom would like to know if pt can stop ASA 325 mg 7 days prior to epidural injection. Pt would need to stop starting tomorrow, is this okay?   Fax [phone]

## 2013-12-16 ENCOUNTER — Encounter: Payer: Self-pay | Admitting: Internal Medicine

## 2013-12-16 ENCOUNTER — Other Ambulatory Visit: Payer: Self-pay | Admitting: Gastroenterology

## 2013-12-16 MED ORDER — PANTOPRAZOLE SODIUM 40 MG PO TBEC: 40.0000 mg | DELAYED_RELEASE_TABLET | Freq: Every day | ORAL | Status: DC

## 2013-12-16 MED ORDER — POLYETHYLENE GLYCOL 3350 17 GM/SCOOP PO POWD: 1.0000 | Freq: Once | ORAL | Status: DC

## 2013-12-16 NOTE — Telephone Encounter (Signed)
Ok to hold aspirin for 7 days.

## 2013-12-16 NOTE — Progress Notes (Signed)
Patient ID: Matthew [name], male   DOB: 07-02-1945, 68 y.o.   MRN: [id number] HPI: Khyan Oats is a 68 yo male with a complicated PMH including remote proctitis dating back to 1979, history of adenomatous and tubulovillous adenomatous colon polyps, chronic constipation, CAD, history of renal cell carcinoma status post right nephrectomy, hypothyroidism, hyperlipidemia, obesity, hypertension, history of substance abuse in remission, diabetes who is seen to establish care with me. He has been seen by multiple GI providers, most recently Dr. Michail Sermon with [facility], and before this Dr. Benson Norway, [facility] for colonoscopy in 2011 (tubullovillous adenoma removed) and [facility].  He is here today with his wife reports he is changing providers because he "wants somebody that will listen". He also bring in his [facility] records and apparently his service connection is up for reevaluation/reconsideration in the coming months. 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  His last colonoscopy was on 01/28/2013 with Dr. Michail Sermon. This revealed one 5 mm cecal polyp which was a tubular adenoma, one 5 mm sigmoid polyp also an adenoma and 2, 1-3 mm,rectal polyps which were hyperplastic. All these polyps were removed.  Internal hemorrhoids were also seen. No colitis or proctitis was seen. Dr. Michail Sermon last saw the patient "2 to 3 weeks ago".  He was also seen on 10/09/2013 by primary care, Dr. Orland Penman to address FMLA paperwork  Today he reports that he has diffuse abdominal discomfort which is somewhat chronic. This is associated with constipation alternating with loose stools. He can have spontaneous episodes of fecal incontinence which she finds very limiting his activities. He was previously using Linzess on an as-needed basis and in the last week has started Metamucil. He has abdominal tenderness it is most severe in the lower abdomen. At times he feels  rectal pressure and reports a history of anal fissure. He reports seeing bright red blood per rectum 2-3 times per month and does endorse tenesmus. He reports a good appetite without nausea or vomiting. No dysphagia. He is still having some reflux despite Prilosec 40 mg twice daily. He reports Nexium always worked better for him than omeprazole. He reports chronic lower back pain, previously treated with steroid injection the recently he could not get these injections due to issues with his blood pressure. He denies fevers or chills. He does report a perianal and inguinal rash which has been long-standing and is pruritic. Steroid creams in the past have not help and at times seemed to worsen the rash.  Past Medical History  Diagnosis Date  . Diabetes mellitus   . Hypertension   . Cancer   . Empyema lung   . Nodule of left lung   . Chronic kidney disease   . CAD (coronary artery disease)   . Renal cell carcinoma     History of  . Amenia   . Cocaine abuse in remission   . Paroxysmal supraventricular tachycardia   . Hypothyroidism   . Hyperlipidemia   . Iron deficiency anemia   . Vitamin D deficiency   . Scrotal abscess   . Enlarged prostate   . Ulcerative colitis     Past Surgical History  Procedure Laterality Date  . Bronchoscopy, right video-assisted thoracoscopy, minithoracotomy, drainage of pleural effusion, and decortication.  02/26/2011    [facility]  . Tooth extraction    . Irrigation and debridement of scrotal wound and closure of scrotal wound.    Marland Kitchen  Incision, drainage and debridement of perineum and  scrotum    . Cabg x 4  05/13/2009    [facility]  . Right radical nephrectomy.    Marland Kitchen Hernia repair    . Hemorrhoid surgery      Outpatient Prescriptions Prior to Visit  Medication Sig Dispense Refill  . albuterol (PROVENTIL HFA;VENTOLIN HFA) 108 (90 BASE) MCG/ACT inhaler Inhale into the lungs every 6 (six) hours as needed for wheezing or shortness of breath.      Marland Kitchen buPROPion  (WELLBUTRIN XL) 300 MG 24 hr tablet Take 150 mg by mouth daily.       . cyclobenzaprine (FEXMID) 7.5 MG tablet Take 10 mg by mouth 3 (three) times daily as needed.       . fenofibrate 54 MG tablet TAKE 1 TABLET BY MOUTH ONCE DAILY  30 tablet  5  . ferrous sulfate dried (SLOW FE) 160 (50 FE) MG TBCR Take 160 mg by mouth 2 (two) times daily with a meal.        . gabapentin (NEURONTIN) 300 MG capsule Take 300 mg by mouth 2 times daily at 12 noon and 4 pm.        . hydrocortisone-pramoxine (ANALPRAM-HC) 2.5-1 % rectal cream Place 1 application rectally 3 (three) times daily.      . insulin detemir (LEVEMIR) 100 UNIT/ML injection Inject 35 Units into the skin 2 (two) times daily.       Marland Kitchen levothyroxine (SYNTHROID, LEVOTHROID) 100 MCG tablet Take 100 mcg by mouth daily.        . mupirocin ointment (BACTROBAN) 2 % Place 1 application into the nose daily.      . nitroGLYCERIN (NITROSTAT) 0.4 MG SL tablet Place 0.4 mg under the tongue every 5 (five) minutes as needed.        . Omega-3 Fatty Acids (OMEGA 3 PO) Take 8 tablets by mouth daily. Tablets are 1,000 mg      . omeprazole (PRILOSEC) 40 MG capsule Take 40 mg by mouth daily.       Marland Kitchen oxyCODONE-acetaminophen (PERCOCET/ROXICET) 5-325 MG per tablet Take 1 tablet by mouth every 6 (six) hours as needed for severe pain.      . pramoxine-hydrocortisone (ANALPRAM HC) cream Apply topically 2 (two) times daily as needed.        . tizanidine (ZANAFLEX) 2 MG capsule Take 2 mg by mouth as needed for muscle spasms.      . traZODone (DESYREL) 100 MG tablet Take 100 mg by mouth at bedtime.      . Linaclotide (LINZESS) 145 MCG CAPS capsule Take 145 mcg by mouth daily.      Marland Kitchen acetaminophen (TYLENOL) 650 MG CR tablet Take 650 mg by mouth every 8 (eight) hours as needed.        Marland Kitchen aspirin 325 MG EC tablet Take 325 mg by mouth daily.        Marland Kitchen atorvastatin (LIPITOR) 40 MG tablet Take 1 tablet (40 mg total) by mouth daily.  30 tablet  3  . diphenhydrAMINE (BENADRYL) 2 % cream  Apply topically 2 (two) times daily as needed.        . insulin aspart (NOVOLOG) 100 UNIT/ML injection Inject 4 Units into the skin 3 (three) times daily before meals.         No facility-administered medications prior to visit.    No Known Allergies  Family History  Problem Relation Age of Onset  . Alzheimer's disease Mother   . Heart  disease Father     History  Substance Use Topics  . Smoking status: Never Smoker   . Smokeless tobacco: Never Used  . Alcohol Use: No    ROS: As per history of present illness, otherwise negative  BP 124/72  Pulse 73  Ht 6' (1.829 m)  Wt 270 lb (122.471 kg)  BMI 36.61 kg/m2 Constitutional: Well-developed and well-nourished. No distress. HEENT: Normocephalic and atraumatic. Oropharynx is clear and moist. No oropharyngeal exudate. Conjunctivae are normal.  No scleral icterus. Neck: Neck supple. Trachea midline. Cardiovascular: Normal rate, regular rhythm and intact distal pulses.  Pulmonary/chest: Effort normal and breath sounds normal. No wheezing, rales or rhonchi. Abdominal: Soft, obese, mild diffuse tenderness without rebound or guarding, nondistended. Bowel sounds active throughout.  Extremities: no clubbing, cyanosis, or trace pretibial edema Neurological: Alert and oriented to person place and time. Skin: Skin is warm and dry. Confluent hyperpigmented areas on bilateral buttocks extending through the perineal skin to the inguinal canal, most consistent with tinea Psychiatric: Normal mood and affect. Behavior is normal.  RELEVANT LABS AND IMAGING: CBC    Component Value Date/Time   WBC 11.0* 12/15/2013 1109   RBC 4.58 12/15/2013 1109   HGB 14.3 12/15/2013 1109   HCT 42.8 12/15/2013 1109   PLT 237.0 12/15/2013 1109   MCV 93.6 12/15/2013 1109   MCH 30.3 03/06/2011 0910   MCHC 33.5 12/15/2013 1109   RDW 13.5 12/15/2013 1109   LYMPHSABS 2.6 02/12/2011 1240   MONOABS 1.5* 02/12/2011 1240   EOSABS 0.2 02/12/2011 1240   BASOSABS 0.0 02/12/2011 1240     CMP     Component Value Date/Time   NA 135 12/15/2013 1109   K 4.8 12/15/2013 1109   CL 98 12/15/2013 1109   CO2 31 12/15/2013 1109   GLUCOSE 346* 12/15/2013 1109   BUN 23 12/15/2013 1109   CREATININE 2.1* 12/15/2013 1109   CALCIUM 9.7 12/15/2013 1109   PROT 7.3 12/15/2013 1109   ALBUMIN 3.8 12/15/2013 1109   AST 22 12/15/2013 1109   ALT 26 12/15/2013 1109   ALKPHOS 28* 12/15/2013 1109   BILITOT 0.5 12/15/2013 1109   GFRNONAA 33* 03/02/2011 0540   GFRAA 40* 03/02/2011 0540   Records reviewed: Colonoscopy Aug 2014 ([facility]) - TA x 2, hyperplastic polyp, no colitis  Report of colonoscopy from [facility] with colonoscopy May 2011 tubulovillous adenoma and negative colon biopsies. Unremarkable EGD. History of reported hepatitis C core antibody positivity with negative surface antigen and antibody, was seen by hepatologist in [place] than 1 hour spent with patient and separately reviewing extensive records  ASSESSMENT/PLAN:  68 yo male with a complicated PMH including remote proctitis dating back to 1979, history of adenomatous and tubulovillous adenomatous colon polyps, chronic constipation, CAD, history of renal cell carcinoma status post right nephrectomy, hypothyroidism, hyperlipidemia, obesity, hypertension, history of substance abuse in remission, diabetes who is seen to establish care with me.   1. Remote ulcerative proctitis/chronic constipation/internal hemorrhoids/rectal bleeding/abd pain -- he has seen multiple GI physicians in the past, several times with very similar complaints. I do not have any recent objective evidence for ulcerative colitis or ulcerative proctitis. His most recent colonoscopy was in 2014 and he had 2 small adenomatous polyps removed but no colitis was seen. He continues to report issues with abdominal pain and rectal bleeding. He is also having alternating bowel habits but it seems that constipation is predominant. He may have some  element of overflow  diarrhea in this setting. His rectal bleeding is likely hemorrhoidal, but given his history it is difficult to completely exclude proctitis or colitis without repeat colonoscopy. My suspicion is that his constipation, which is likely exacerbated by narcotics, is one of the biggest issues causing abdominal complaint and he very likely has a component of narcotic bowel. I recommended a CT scan of the abdomen and pelvis to evaluate his abdominal pain and also look for colonic thickening. I recommend repeating the colonoscopy to rule out colitis. He is using Linzess sporadically and when he does it is causing diarrhea therefore I am discontinuing it. He also reports a previous nonresponse to lubiprostone. He has been on Metamucil for the past week and I have recommended that he increase to 1-2 tablespoons daily and add MiraLax 17 g daily. If his stools are too loose he can change MiraLax to every other day. I will check labs today to include CBC, CMP, CRP, TSH and iron stores. He is also curious as to whether some of his other chronic conditions such as osteoarthritis and disc disease could be secondary to his diagnosis of colitis. That is yet to be determined, but my suspicion is given the fact that he has not had recent ulcerative colitis, it is unlikely that he has colitis associated arthropathy.  I will bill to further address his colitis once imaging, labs and colonoscopy have been performed. If internal hemorrhoids are found, my recommendation would be for banding.   2.  Perianal and inguinal rash -- most consistent with tinea corporis. Will treat with nystatin cream twice daily until resolved. If it fails to resolve he can be referred to dermatology. Previous nonresponse to topical steroid creams not unexpected if this is fungal infection.  3.  History of adenomatous colon polyp -- surveillance interval likely 5 years if no polyps found at upcoming colonoscopy

## 2013-12-17 ENCOUNTER — Telehealth: Payer: Self-pay | Admitting: Gastroenterology

## 2013-12-17 ENCOUNTER — Telehealth: Payer: Self-pay | Admitting: Internal Medicine

## 2013-12-17 NOTE — Telephone Encounter (Signed)
Faxed to [place] at [facility].

## 2013-12-17 NOTE — Telephone Encounter (Signed)
I reviewed the results with the patient's wife.  All of her questions were answered.  She will call back for any additional questions or concerns

## 2013-12-17 NOTE — Telephone Encounter (Signed)
Started prior authorization for Pantoprazole

## 2013-12-18 ENCOUNTER — Other Ambulatory Visit: Payer: Self-pay

## 2013-12-18 ENCOUNTER — Encounter: Payer: Self-pay | Admitting: Internal Medicine

## 2013-12-18 ENCOUNTER — Other Ambulatory Visit (INDEPENDENT_AMBULATORY_CARE_PROVIDER_SITE_OTHER): Payer: 59

## 2013-12-18 DIAGNOSIS — E782 Mixed hyperlipidemia: Secondary | ICD-10-CM | POA: Diagnosis not present

## 2013-12-18 LAB — LIPID PANEL
CHOLESTEROL: 237 mg/dL — AB (ref 0–200)
HDL: 34 mg/dL — ABNORMAL LOW (ref >39.00)
LDL Cholesterol: 131 mg/dL — ABNORMAL HIGH (ref 0–99)
NonHDL: 203
Total CHOL/HDL Ratio: 7
Triglycerides: 358 mg/dL — ABNORMAL HIGH (ref 0.0–149.0)
VLDL: 71.6 mg/dL — ABNORMAL HIGH (ref 0.0–40.0)

## 2013-12-18 LAB — HEPATIC FUNCTION PANEL
ALK PHOS: 28 U/L — AB (ref 39–117)
ALT: 23 U/L (ref 0–53)
AST: 19 U/L (ref 0–37)
Albumin: 3.8 g/dL (ref 3.5–5.2)
BILIRUBIN DIRECT: 0 mg/dL (ref 0.0–0.3)
TOTAL PROTEIN: 7.5 g/dL (ref 6.0–8.3)
Total Bilirubin: 0.3 mg/dL (ref 0.2–1.2)

## 2013-12-20 ENCOUNTER — Observation Stay (HOSPITAL_COMMUNITY)
Admission: EM | Admit: 2013-12-20 | Discharge: 2013-12-22 | Disposition: A | Discharge status: Home / Self Care | Payer: 59 | Attending: Internal Medicine | Admitting: Internal Medicine

## 2013-12-20 ENCOUNTER — Emergency Department (HOSPITAL_COMMUNITY): Payer: 59

## 2013-12-20 ENCOUNTER — Encounter (HOSPITAL_COMMUNITY): Payer: Self-pay | Admitting: Emergency Medicine

## 2013-12-20 DIAGNOSIS — C801 Malignant (primary) neoplasm, unspecified: Secondary | ICD-10-CM | POA: Insufficient documentation

## 2013-12-20 DIAGNOSIS — I444 Left anterior fascicular block: Secondary | ICD-10-CM | POA: Diagnosis present

## 2013-12-20 DIAGNOSIS — G8929 Other chronic pain: Secondary | ICD-10-CM | POA: Diagnosis present

## 2013-12-20 DIAGNOSIS — R079 Chest pain, unspecified: Principal | ICD-10-CM

## 2013-12-20 DIAGNOSIS — R0789 Other chest pain: Secondary | ICD-10-CM | POA: Diagnosis not present

## 2013-12-20 DIAGNOSIS — R5383 Other fatigue: Secondary | ICD-10-CM

## 2013-12-20 DIAGNOSIS — I129 Hypertensive chronic kidney disease with stage 1 through stage 4 chronic kidney disease, or unspecified chronic kidney disease: Secondary | ICD-10-CM | POA: Insufficient documentation

## 2013-12-20 DIAGNOSIS — Z85118 Personal history of other malignant neoplasm of bronchus and lung: Secondary | ICD-10-CM

## 2013-12-20 DIAGNOSIS — E039 Hypothyroidism, unspecified: Secondary | ICD-10-CM | POA: Insufficient documentation

## 2013-12-20 DIAGNOSIS — E119 Type 2 diabetes mellitus without complications: Secondary | ICD-10-CM | POA: Diagnosis not present

## 2013-12-20 DIAGNOSIS — Z794 Long term (current) use of insulin: Secondary | ICD-10-CM | POA: Insufficient documentation

## 2013-12-20 DIAGNOSIS — I471 Supraventricular tachycardia, unspecified: Secondary | ICD-10-CM

## 2013-12-20 DIAGNOSIS — I4891 Unspecified atrial fibrillation: Secondary | ICD-10-CM | POA: Insufficient documentation

## 2013-12-20 DIAGNOSIS — R0602 Shortness of breath: Secondary | ICD-10-CM | POA: Insufficient documentation

## 2013-12-20 DIAGNOSIS — N183 Chronic kidney disease, stage 3 unspecified: Secondary | ICD-10-CM

## 2013-12-20 DIAGNOSIS — B359 Dermatophytosis, unspecified: Secondary | ICD-10-CM | POA: Insufficient documentation

## 2013-12-20 DIAGNOSIS — I5022 Chronic systolic (congestive) heart failure: Secondary | ICD-10-CM

## 2013-12-20 DIAGNOSIS — Z79899 Other long term (current) drug therapy: Secondary | ICD-10-CM | POA: Insufficient documentation

## 2013-12-20 DIAGNOSIS — R109 Unspecified abdominal pain: Secondary | ICD-10-CM

## 2013-12-20 DIAGNOSIS — R5381 Other malaise: Secondary | ICD-10-CM | POA: Insufficient documentation

## 2013-12-20 DIAGNOSIS — Z951 Presence of aortocoronary bypass graft: Secondary | ICD-10-CM | POA: Insufficient documentation

## 2013-12-20 DIAGNOSIS — I480 Paroxysmal atrial fibrillation: Secondary | ICD-10-CM

## 2013-12-20 DIAGNOSIS — I452 Bifascicular block: Secondary | ICD-10-CM | POA: Insufficient documentation

## 2013-12-20 DIAGNOSIS — C649 Malignant neoplasm of unspecified kidney, except renal pelvis: Secondary | ICD-10-CM | POA: Diagnosis present

## 2013-12-20 DIAGNOSIS — I10 Essential (primary) hypertension: Secondary | ICD-10-CM

## 2013-12-20 DIAGNOSIS — K5190 Ulcerative colitis, unspecified, without complications: Secondary | ICD-10-CM | POA: Insufficient documentation

## 2013-12-20 DIAGNOSIS — I25119 Atherosclerotic heart disease of native coronary artery with unspecified angina pectoris: Secondary | ICD-10-CM

## 2013-12-20 DIAGNOSIS — I2510 Atherosclerotic heart disease of native coronary artery without angina pectoris: Secondary | ICD-10-CM | POA: Diagnosis not present

## 2013-12-20 DIAGNOSIS — I4510 Unspecified right bundle-branch block: Secondary | ICD-10-CM

## 2013-12-20 DIAGNOSIS — E785 Hyperlipidemia, unspecified: Secondary | ICD-10-CM | POA: Diagnosis not present

## 2013-12-20 DIAGNOSIS — I519 Heart disease, unspecified: Secondary | ICD-10-CM

## 2013-12-20 DIAGNOSIS — I25810 Atherosclerosis of coronary artery bypass graft(s) without angina pectoris: Secondary | ICD-10-CM

## 2013-12-20 DIAGNOSIS — Z905 Acquired absence of kidney: Secondary | ICD-10-CM | POA: Insufficient documentation

## 2013-12-20 DIAGNOSIS — I714 Abdominal aortic aneurysm, without rupture, unspecified: Secondary | ICD-10-CM

## 2013-12-20 DIAGNOSIS — I209 Angina pectoris, unspecified: Secondary | ICD-10-CM

## 2013-12-20 HISTORY — DX: Anemia, unspecified: D64.9

## 2013-12-20 HISTORY — DX: Obesity, unspecified: E66.9

## 2013-12-20 HISTORY — DX: Unspecified right bundle-branch block: I45.10

## 2013-12-20 HISTORY — DX: Chronic kidney disease, stage 3 unspecified: N18.30

## 2013-12-20 HISTORY — DX: Polyp of colon: K63.5

## 2013-12-20 HISTORY — DX: Other constipation: K59.09

## 2013-12-20 HISTORY — DX: Malignant neoplasm of unspecified part of unspecified bronchus or lung: C34.90

## 2013-12-20 HISTORY — DX: Left anterior fascicular block: I44.4

## 2013-12-20 HISTORY — DX: Abdominal aortic aneurysm, without rupture: I71.4

## 2013-12-20 HISTORY — DX: Unspecified atrial fibrillation: I48.91

## 2013-12-20 HISTORY — DX: Ulcerative (chronic) proctitis without complications: K51.20

## 2013-12-20 HISTORY — DX: Chronic kidney disease, stage 3 (moderate): N18.3

## 2013-12-20 HISTORY — DX: Abdominal aortic aneurysm, without rupture, unspecified: I71.40

## 2013-12-20 LAB — COMPREHENSIVE METABOLIC PANEL
ALT: 22 U/L (ref 0–53)
AST: 22 U/L (ref 0–37)
Albumin: 3.5 g/dL (ref 3.5–5.2)
Alkaline Phosphatase: 30 U/L — ABNORMAL LOW (ref 39–117)
Anion gap: 16 — ABNORMAL HIGH (ref 5–15)
BUN: 28 mg/dL — ABNORMAL HIGH (ref 6–23)
CO2: 22 mEq/L (ref 19–32)
Calcium: 10 mg/dL (ref 8.4–10.5)
Chloride: 101 mEq/L (ref 96–112)
Creatinine, Ser: 1.61 mg/dL — ABNORMAL HIGH (ref 0.50–1.35)
GFR calc Af Amer: 49 mL/min — ABNORMAL LOW (ref >90)
GFR calc non Af Amer: 42 mL/min — ABNORMAL LOW (ref >90)
Glucose, Bld: 138 mg/dL — ABNORMAL HIGH (ref 70–99)
Potassium: 4.5 mEq/L (ref 3.7–5.3)
Sodium: 139 mEq/L (ref 137–147)
Total Bilirubin: 0.3 mg/dL (ref 0.3–1.2)
Total Protein: 7.6 g/dL (ref 6.0–8.3)

## 2013-12-20 LAB — CBC
HCT: 45 % (ref 39.0–52.0)
HEMOGLOBIN: 15.3 g/dL (ref 13.0–17.0)
MCH: 31.5 pg (ref 26.0–34.0)
MCHC: 34 g/dL (ref 30.0–36.0)
MCV: 92.8 fL (ref 78.0–100.0)
PLATELETS: 229 10*3/uL (ref 150–400)
RBC: 4.85 MIL/uL (ref 4.22–5.81)
RDW: 12.4 % (ref 11.5–15.5)
WBC: 8 10*3/uL (ref 4.0–10.5)

## 2013-12-20 LAB — HEMOGLOBIN A1C
Hgb A1c MFr Bld: 8.2 % — ABNORMAL HIGH (ref <5.7)
Mean Plasma Glucose: 189 mg/dL — ABNORMAL HIGH (ref <117)

## 2013-12-20 LAB — RAPID URINE DRUG SCREEN, HOSP PERFORMED
Amphetamines: NOT DETECTED
BARBITURATES: NOT DETECTED
BENZODIAZEPINES: NOT DETECTED
Cocaine: NOT DETECTED
OPIATES: NOT DETECTED
TETRAHYDROCANNABINOL: NOT DETECTED

## 2013-12-20 LAB — GLUCOSE, CAPILLARY
GLUCOSE-CAPILLARY: 227 mg/dL — AB (ref 70–99)
GLUCOSE-CAPILLARY: 196 mg/dL — AB (ref 70–99)
Glucose-Capillary: 331 mg/dL — ABNORMAL HIGH (ref 70–99)

## 2013-12-20 LAB — PRO B NATRIURETIC PEPTIDE: Pro B Natriuretic peptide (BNP): 288.7 pg/mL — ABNORMAL HIGH (ref 0–125)

## 2013-12-20 LAB — TROPONIN I
Troponin I: <0.3 ng/mL (ref <0.30)
Troponin I: <0.3 ng/mL (ref <0.30)

## 2013-12-20 MED ORDER — ENOXAPARIN SODIUM 30 MG/0.3ML ~~LOC~~ SOLN
30.0000 mg | SUBCUTANEOUS | Status: DC
  Administered 2013-12-20 – 2013-12-21 (×2): 30 mg via SUBCUTANEOUS
  Filled 2013-12-20 (×3): qty 0.3

## 2013-12-20 MED ORDER — LIDOCAINE 4 % EX CREA
TOPICAL_CREAM | Freq: Two times a day (BID) | CUTANEOUS | Status: DC | PRN
Start: 2013-12-20 — End: 2013-12-22

## 2013-12-20 MED ORDER — FERROUS SULFATE 325 (65 FE) MG PO TABS
325.0000 mg | ORAL_TABLET | Freq: Every day | ORAL | Status: DC
  Administered 2013-12-21 – 2013-12-22 (×2): 325 mg via ORAL
  Filled 2013-12-20 (×4): qty 1

## 2013-12-20 MED ORDER — MAGNESIUM OXIDE 420 MG PO TABS: 1.0000 | ORAL_TABLET | Freq: Every day | ORAL | Status: DC

## 2013-12-20 MED ORDER — VITAMIN D3 25 MCG (1000 UNIT) PO TABS
3000.0000 [IU] | ORAL_TABLET | Freq: Every day | ORAL | Status: DC
  Administered 2013-12-20 – 2013-12-22 (×3): 3000 [IU] via ORAL
  Filled 2013-12-20 (×3): qty 3

## 2013-12-20 MED ORDER — PSYLLIUM 95 % PO PACK
1.0000 | PACK | Freq: Every day | ORAL | Status: DC
  Administered 2013-12-20 – 2013-12-22 (×3): 1 via ORAL
  Filled 2013-12-20 (×3): qty 1

## 2013-12-20 MED ORDER — PSYLLIUM 0.52 G PO CAPS: 0.5200 g | ORAL_CAPSULE | Freq: Every day | ORAL | Status: DC

## 2013-12-20 MED ORDER — INSULIN LISPRO 100 UNIT/ML (KWIKPEN): 16.0000 [IU] | PEN_INJECTOR | Freq: Three times a day (TID) | SUBCUTANEOUS | Status: DC

## 2013-12-20 MED ORDER — OXYCODONE-ACETAMINOPHEN 5-325 MG PO TABS
1.0000 | ORAL_TABLET | ORAL | Status: DC | PRN
  Administered 2013-12-20 – 2013-12-22 (×2): 1 via ORAL
  Filled 2013-12-20 (×2): qty 1

## 2013-12-20 MED ORDER — LEVOTHYROXINE SODIUM 100 MCG PO TABS
100.0000 ug | ORAL_TABLET | Freq: Every day | ORAL | Status: DC
  Administered 2013-12-20 – 2013-12-22 (×3): 100 ug via ORAL
  Filled 2013-12-20 (×6): qty 1

## 2013-12-20 MED ORDER — FENOFIBRATE 54 MG PO TABS
54.0000 mg | ORAL_TABLET | Freq: Every day | ORAL | Status: DC
  Administered 2013-12-20 – 2013-12-22 (×3): 54 mg via ORAL
  Filled 2013-12-20 (×3): qty 1

## 2013-12-20 MED ORDER — HYDROCORTISONE ACE-PRAMOXINE 2.5-1 % RE CREA
1.0000 "application " | TOPICAL_CREAM | Freq: Three times a day (TID) | RECTAL | Status: DC | PRN
  Filled 2013-12-20: qty 30

## 2013-12-20 MED ORDER — CAPSAICIN 0.025 % EX CREA
1.0000 "application " | TOPICAL_CREAM | Freq: Two times a day (BID) | CUTANEOUS | Status: DC
  Administered 2013-12-20 – 2013-12-22 (×4): 1 via TOPICAL
  Filled 2013-12-20: qty 56.6

## 2013-12-20 MED ORDER — INSULIN ASPART 100 UNIT/ML ~~LOC~~ SOLN
16.0000 [IU] | Freq: Three times a day (TID) | SUBCUTANEOUS | Status: DC
  Administered 2013-12-20 – 2013-12-22 (×6): 16 [IU] via SUBCUTANEOUS

## 2013-12-20 MED ORDER — OMEGA-3-ACID ETHYL ESTERS 1 G PO CAPS
2.0000 g | ORAL_CAPSULE | Freq: Two times a day (BID) | ORAL | Status: DC
  Administered 2013-12-20 – 2013-12-22 (×5): 2 g via ORAL
  Filled 2013-12-20 (×6): qty 2

## 2013-12-20 MED ORDER — POLYETHYLENE GLYCOL 3350 17 G PO PACK
17.0000 g | PACK | Freq: Every day | ORAL | Status: DC
  Administered 2013-12-21 – 2013-12-22 (×2): 17 g via ORAL
  Filled 2013-12-20 (×3): qty 1

## 2013-12-20 MED ORDER — CLOTRIMAZOLE 1 % EX CREA
1.0000 "application " | TOPICAL_CREAM | Freq: Two times a day (BID) | CUTANEOUS | Status: DC
  Administered 2013-12-20 – 2013-12-22 (×4): 1 via TOPICAL
  Filled 2013-12-20: qty 15

## 2013-12-20 MED ORDER — MORPHINE SULFATE 2 MG/ML IJ SOLN
2.0000 mg | INTRAMUSCULAR | Status: DC | PRN
  Administered 2013-12-21 (×2): 2 mg via INTRAVENOUS
  Filled 2013-12-20 (×2): qty 1

## 2013-12-20 MED ORDER — INSULIN DETEMIR 100 UNIT/ML ~~LOC~~ SOLN
46.0000 [IU] | Freq: Two times a day (BID) | SUBCUTANEOUS | Status: DC
  Administered 2013-12-20 – 2013-12-22 (×4): 46 [IU] via SUBCUTANEOUS
  Filled 2013-12-20 (×4): qty 0.46

## 2013-12-20 MED ORDER — ASPIRIN 81 MG PO TABS: 81.0000 mg | ORAL_TABLET | Freq: Every day | ORAL | Status: DC

## 2013-12-20 MED ORDER — ACETAMINOPHEN 325 MG PO TABS: 650.0000 mg | ORAL_TABLET | ORAL | Status: DC | PRN

## 2013-12-20 MED ORDER — ASPIRIN 81 MG PO CHEW
81.0000 mg | CHEWABLE_TABLET | Freq: Every day | ORAL | Status: DC
  Administered 2013-12-20 – 2013-12-22 (×3): 81 mg via ORAL
  Filled 2013-12-20 (×3): qty 1

## 2013-12-20 MED ORDER — INSULIN ASPART 100 UNIT/ML ~~LOC~~ SOLN
0.0000 [IU] | Freq: Three times a day (TID) | SUBCUTANEOUS | Status: DC
  Administered 2013-12-20: 11 [IU] via SUBCUTANEOUS
  Administered 2013-12-21: 2 [IU] via SUBCUTANEOUS
  Administered 2013-12-21: 3 [IU] via SUBCUTANEOUS
  Administered 2013-12-22 (×2): 2 [IU] via SUBCUTANEOUS

## 2013-12-20 MED ORDER — NITROGLYCERIN 0.4 MG SL SUBL: 0.4000 mg | SUBLINGUAL_TABLET | SUBLINGUAL | Status: DC | PRN

## 2013-12-20 MED ORDER — DILTIAZEM HCL 25 MG/5ML IV SOLN
10.0000 mg | Freq: Once | INTRAVENOUS | Status: AC
  Administered 2013-12-20: 10 mg via INTRAVENOUS
  Filled 2013-12-20: qty 5

## 2013-12-20 MED ORDER — BUPROPION HCL ER (XL) 150 MG PO TB24
150.0000 mg | ORAL_TABLET | Freq: Every morning | ORAL | Status: DC
  Administered 2013-12-20 – 2013-12-22 (×3): 150 mg via ORAL
  Filled 2013-12-20 (×3): qty 1

## 2013-12-20 MED ORDER — ONDANSETRON HCL 4 MG/2ML IJ SOLN: 4.0000 mg | Freq: Four times a day (QID) | INTRAMUSCULAR | Status: DC | PRN

## 2013-12-20 MED ORDER — SODIUM CHLORIDE 0.9 % IV SOLN
INTRAVENOUS | Status: DC
  Administered 2013-12-20: 09:00:00 via INTRAVENOUS

## 2013-12-20 MED ORDER — OXYCODONE HCL 5 MG PO TABS
5.0000 mg | ORAL_TABLET | ORAL | Status: DC | PRN
  Administered 2013-12-20: 5 mg via ORAL
  Filled 2013-12-20: qty 1

## 2013-12-20 MED ORDER — ALBUTEROL SULFATE (2.5 MG/3ML) 0.083% IN NEBU
2.5000 mg | INHALATION_SOLUTION | Freq: Four times a day (QID) | RESPIRATORY_TRACT | Status: DC | PRN
  Administered 2013-12-22: 2.5 mg via RESPIRATORY_TRACT

## 2013-12-20 MED ORDER — OXYCODONE-ACETAMINOPHEN 10-325 MG PO TABS: 1.0000 | ORAL_TABLET | ORAL | Status: DC | PRN

## 2013-12-20 MED ORDER — NYSTATIN 100000 UNIT/GM EX CREA
1.0000 "application " | TOPICAL_CREAM | Freq: Two times a day (BID) | CUTANEOUS | Status: DC
  Administered 2013-12-20 – 2013-12-22 (×4): 1 via TOPICAL
  Filled 2013-12-20: qty 15

## 2013-12-20 MED ORDER — PANTOPRAZOLE SODIUM 40 MG PO TBEC
40.0000 mg | DELAYED_RELEASE_TABLET | Freq: Every day | ORAL | Status: DC
  Administered 2013-12-20 – 2013-12-22 (×3): 40 mg via ORAL
  Filled 2013-12-20 (×3): qty 1

## 2013-12-20 MED ORDER — TRAZODONE HCL 50 MG PO TABS
50.0000 mg | ORAL_TABLET | Freq: Every day | ORAL | Status: DC
  Administered 2013-12-20 – 2013-12-21 (×2): 50 mg via ORAL
  Filled 2013-12-20 (×3): qty 1

## 2013-12-20 MED ORDER — INSULIN DETEMIR 100 UNIT/ML ~~LOC~~ SOLN
46.0000 [IU] | Freq: Once | SUBCUTANEOUS | Status: AC
  Administered 2013-12-20: 46 [IU] via SUBCUTANEOUS
  Filled 2013-12-20: qty 0.46

## 2013-12-20 MED ORDER — MAGNESIUM OXIDE 400 (241.3 MG) MG PO TABS
400.0000 mg | ORAL_TABLET | Freq: Every day | ORAL | Status: DC
  Administered 2013-12-20 – 2013-12-22 (×3): 400 mg via ORAL
  Filled 2013-12-20 (×3): qty 1

## 2013-12-20 MED ORDER — GABAPENTIN 300 MG PO CAPS
300.0000 mg | ORAL_CAPSULE | Freq: Two times a day (BID) | ORAL | Status: DC
  Administered 2013-12-20 – 2013-12-22 (×4): 300 mg via ORAL
  Filled 2013-12-20 (×5): qty 1

## 2013-12-20 NOTE — Progress Notes (Addendum)
[facility] progress. Chief complaint. Chest pain. History of present illness. This 68 year old male admitted with complaints of chest pain. Patient does have a history of coronary artery disease and is status post CABG. Admission 12-lead EKG showed atrial fibrillation with controlled rate. Patient does have a prior history of atrial fibrillation. The patient complained of a recurrence of his left side chest pain. 12-lead EKG was obtained and I reviewed this. The EKG does not look significantly different than previous. Prior to this complained of chest pain the patient had an incentive atrial fib with RVR. He does not to take any rate control medications at home. I gave him a dose of Cardizem 10 mg IV as a one-time order. I came to see the patient at bedside given his complaint of chest pain. He states the chest pain lasted only about 3 seconds. States pain was sharp but did not radiate. There is no associated diaphoresis or nausea. Patient states he is now chest pain-free. Denies any associated dyspnea. Vital signs. Temperature 98.1, pulse 99, respiration 18, blood pressure 119/76. O2 sats 99%. General appearance. Well-developed elderly male who is alert and in no distress. Cardiac. Irregularly irregular. No jugular venous distention or significant edema. Lungs. Breath sounds clear and equal. Abdomen. Soft with positive bowel sounds. He has some right lower quadrant tenderness but no rebound or guarding. Impression/plan. Problem #1. Chest pain. Chest pain relieved without further action. Patient states pain was sharp but only lasted about 3 seconds. He has had 3 troponins result negative to this point. I will add an additional 3 troponins to continue to monitor. His EKG did not look significantly changed from that the pain earlier in the hospitalization. Will repeat an EKG in about 6 hours to evaluate for any changes. Patient requested to inform staff of any further chest pain. Problem #2. Atrial  fib with RVR. Patient having runs of atrial fib with RVR lasting up to 30 minutes. I have a ordered a dose of Cardizem 10 mg IV one time and if this recurs we'll need to place patient on a Cardizem drip.

## 2013-12-20 NOTE — Progress Notes (Signed)
Patientr c/o CP lasted 3 sec. On call NP Kathline Magic NP paged and made aware. EKG done -Afib Bifascicular block. Will monitor closely.  12/20/13 2138  Vitals  BP 119/76 mmHg  BP Location Left arm  BP Method Automatic  Patient Position (if appropriate) Lying  Pulse Rate 99  Pulse Rate Source Dinamap  Oxygen Therapy  SpO2 99 %  O2 Device Nasal cannula  O2 Flow Rate (L/min) 2 L/min

## 2013-12-20 NOTE — ED Notes (Signed)
Patient transported to X-ray 

## 2013-12-20 NOTE — H&P (Addendum)
[facility] History and Physical  Matthew [name] DGU:[id number] DOB: July 16, 1945 DOA: 12/20/2013  Referring physician: ER physician PCP: Gavin Pound, MD   Chief Complaint: chest pain  HPI:  68 year old male with past medical history of CAD status post CABG x 4, history of renal cell carcinoma statu post right nephrectomy, history of paroxysmal atrial fibrillation, hypertension, dyslipidemia, diabetes, hypothyroidism, ulcerative colitis who presented to [facility] ED 12/20/2013 with ongoing left sided chest pain for past 1 week duration but worsening over past 24 hours hours, 8/10 in intensity, at rest and with exertion, not radiating, not relieved with analgesia at home. Chest pain was associated with shortness of breath. No leg swelling. No fevers or chills. No reports of cough or sputum production. No palpitations. No reports of abdominal pain, nausea or vomiting. No reports of lightheadedness or loss of consciousness. In ED, BP was 110/60, HR 60, T max 98 F and oxygen saturation was 93% on room air. Blood work revealed creatinine of 1.61 otherwise unremarkable. The 12 lead EKG showed atrial fibrillation. The first troponin was WNL.  Assessment & Plan    Principal Problem: Chest pain / history of CAD status post CABG  Rule out ACS; the first troponin level was WNL. Cycle cardiac enzymes, obtain 2 D ECHO.  Heart score is 6 on admission; may need cardio consult in am  The 12 lead EKG showed atrial fibrillation (rate 60); repeat admission EKG  BNP on admission 289.  Continue aspirin 81 mg daily.  Continue fenofibrate and omega 3   Active Problems: CKD, stage 3 GFR 31-60  Creatinine in 2012 1.7 and on this admission 1.61 so seems to be in baseline range Atrial fibrillation  Repeat admission EKG; will ask cardio in am for input. Pt has h/o paroxysmal a fib, only on aspirin, has chronic rectal bleed from UC Diabetes, with renal manifestations  Check A1c  Restart home insulin  regimen: Levemir 40 units BID and novolog 16 units TID  Glycemic order set in place  History of tinea  Continue home topical ointments  Hypothyroidism  Continue synthroid 100 mcg daily   DVT prophylaxis:  Lovenox sub Q while pt is in hospital   Radiological Exams on Admission: Dg Chest 2 View  12/20/2013   IMPRESSION: No acute cardiopulmonary process.    09:16    EKG: atrial fibrillation   Code Status: Full Family Communication: Plan of care discussed with the patient  Disposition Plan: Admit for further evaluation; observation to telemetry unit   Leisa Lenz, MD  [facility] Pager [phone]  Review of Systems:  Constitutional: Negative for fever, chills and malaise/fatigue. Negative for diaphoresis.  HENT: Negative for hearing loss, ear pain, nosebleeds, congestion, sore throat, neck pain, tinnitus and ear discharge.   Eyes: Negative for blurred vision, double vision, photophobia, pain, discharge and redness.  Respiratory: Negative for cough, hemoptysis, sputum production, positive for shortness of breath, no wheezing and stridor.   Cardiovascular: per HPI.  Gastrointestinal: Negative for nausea, vomiting and abdominal pain. Negative for heartburn, constipation, blood in stool and melena.  Genitourinary: Negative for dysuria, urgency, frequency, hematuria and flank pain.  Musculoskeletal: Negative for myalgias, back pain, joint pain and falls.  Skin: Negative for itching and rash.  Neurological: Negative for dizziness and weakness. Negative for tingling, tremors, sensory change, speech change, focal weakness, loss of consciousness and headaches.  Endo/Heme/Allergies: Negative for environmental allergies and polydipsia. Does not bruise/bleed easily.  Psychiatric/Behavioral: Negative for suicidal ideas. The patient is not  nervous/anxious.      Past Medical History  Diagnosis Date  . Diabetes mellitus   . Hypertension   . Cancer   . Empyema lung   . Nodule of left  lung   . Chronic kidney disease   . CAD (coronary artery disease)   . Renal cell carcinoma     History of  . Amenia   . Cocaine abuse in remission   . Paroxysmal supraventricular tachycardia   . Hypothyroidism   . Hyperlipidemia   . Iron deficiency anemia   . Vitamin D deficiency   . Scrotal abscess   . Enlarged prostate   . Ulcerative colitis    Past Surgical History  Procedure Laterality Date  . Bronchoscopy, right video-assisted thoracoscopy, minithoracotomy, drainage of pleural effusion, and decortication.  02/26/2011    Gerhardt  . Tooth extraction    . Irrigation and debridement of scrotal wound and closure of scrotal wound.    . Incision, drainage and debridement of perineum and  scrotum    . Cabg x 4  05/13/2009    Hendrickson  . Right radical nephrectomy.    Marland Kitchen Hernia repair    . Hemorrhoid surgery     Social History:  reports that he has never smoked. He has never used smokeless tobacco. He reports that he does not drink alcohol or use illicit drugs.  Allergies  Allergen Reactions  . Atorvastatin Other (See Comments)    Leg muscle cramps  . Tetanus Toxoids Hives    Family History:  Family History  Problem Relation Age of Onset  . Alzheimer's disease Mother   . Heart disease Father      Prior to Admission medications   Medication Sig Start Date End Date Taking? Authorizing Provider  albuterol (PROVENTIL HFA;VENTOLIN HFA) 108 (90 BASE) MCG/ACT inhaler Inhale into the lungs every 6 (six) hours as needed for wheezing or shortness of breath.   Yes Historical Provider, MD  aspirin 81 MG tablet Take 81 mg by mouth daily.   Yes Historical Provider, MD  buPROPion (WELLBUTRIN XL) 150 MG 24 hr tablet Take 150 mg by mouth every morning.   Yes Historical Provider, MD  capsaicin (ZOSTRIX) 0.025 % cream Apply 1 application topically 2 (two) times daily.   Yes Historical Provider, MD  cholecalciferol (VITAMIN D) 1000 UNITS tablet Take 3,000 Units by mouth daily.   Yes  Historical Provider, MD  clotrimazole (LOTRIMIN) 1 % cream Apply 1 application topically 2 (two) times daily.   Yes Historical Provider, MD  fenofibrate 54 MG tablet Take 54 mg by mouth daily.   Yes Historical Provider, MD  ferrous sulfate dried (SLOW FE) 160 (50 FE) MG TBCR Take 160 mg by mouth 2 (two) times daily with a meal.     Yes Historical Provider, MD  gabapentin (NEURONTIN) 300 MG capsule Take 300 mg by mouth 2 times daily at 12 noon and 4 pm.     Yes Historical Provider, MD  glucose 4 GM chewable tablet Chew 1 tablet by mouth as needed for low blood sugar.   Yes Historical Provider, MD  hydrocortisone-pramoxine [facility]) 2.5-1 % rectal cream Place 1 application rectally 3 (three) times daily as needed for hemorrhoids or itching.    Yes Historical Provider, MD  insulin detemir (LEVEMIR) 100 UNIT/ML injection Inject 46 Units into the skin 2 (two) times daily.    Yes Historical Provider, MD  insulin lispro (HUMALOG KWIKPEN) 100 UNIT/ML KiwkPen Inject 16 Units into the skin 3 (three)  times daily. Take as directed   Yes Historical Provider, MD  levothyroxine (SYNTHROID, LEVOTHROID) 100 MCG tablet Take 100 mcg by mouth daily before breakfast.    Yes Historical Provider, MD  LIDOCAINE EX Apply 1 application topically 2 (two) times daily as needed.   Yes Historical Provider, MD  Magnesium Oxide 420 MG TABS Take 1 tablet by mouth daily.   Yes Historical Provider, MD  mupirocin ointment (BACTROBAN) 2 % Place 1 application into the nose daily.   Yes Historical Provider, MD  nitroGLYCERIN (NITROSTAT) 0.4 MG SL tablet Place 0.4 mg under the tongue every 5 (five) minutes as needed for chest pain.    Yes Historical Provider, MD  nystatin cream (MYCOSTATIN) Apply 1 application topically 2 (two) times daily.   Yes Historical Provider, MD  Omega-3 Fatty Acids (OMEGA 3 PO) Take 4,000 mg by mouth 2 (two) times daily.    Yes Historical Provider, MD  omeprazole (PRILOSEC) 40 MG capsule Take 40 mg by mouth  daily.    Yes Historical Provider, MD  oxyCODONE-acetaminophen (PERCOCET) 10-325 MG per tablet Take 1 tablet by mouth every 4 (four) hours as needed for pain.   Yes Historical Provider, MD  polyethylene glycol (MIRALAX / GLYCOLAX) packet Take 17 g by mouth daily.   Yes Historical Provider, MD  psyllium (REGULOID) 0.52 G capsule Take 0.52 g by mouth daily.   Yes Historical Provider, MD  tizanidine (ZANAFLEX) 2 MG capsule Take 2 mg by mouth as needed for muscle spasms.   Yes Historical Provider, MD  traZODone (DESYREL) 50 MG tablet Take 50 mg by mouth at bedtime.   Yes Historical Provider, MD   Physical Exam: Filed Vitals:   12/20/13 0830 12/20/13 0912 12/20/13 0935 12/20/13 1000  BP: 134/54 136/89 110/60 156/102  Pulse: 60 72 66 66  Temp:      TempSrc:      Resp: 17     Height:      Weight:      SpO2: 97% 97% 99% 96%    Physical Exam  Constitutional: Appears well-developed and well-nourished. No distress.  HENT: Normocephalic. No tonsillar erythema or exudates Eyes: Conjunctivae and EOM are normal. PERRLA, no scleral icterus.  Neck: Normal ROM. Neck supple. No JVD. No tracheal deviation. No thyromegaly.  CVS: irregular rhythm, rate controlled, S1/S2 +, no murmurs, no gallops, no carotid bruit.  Pulmonary: Effort and breath sounds normal, no stridor, rhonchi, wheezes, rales.  Abdominal: Soft. BS +,  no distension, tenderness, rebound or guarding.  Musculoskeletal: Normal range of motion. No edema and no tenderness.  Lymphadenopathy: No lymphadenopathy noted, cervical, inguinal. Neuro: Alert. Normal reflexes, muscle tone coordination.  Skin: Skin is warm and dry.  Psychiatric: Normal mood and affect.   Labs on Admission:  Basic Metabolic Panel:  Recent Labs Lab 12/15/13 1109 12/20/13 0846  NA 135 139  K 4.8 4.5  CL 98 101  CO2 31 22  GLUCOSE 346* 138*  BUN 23 28*  CREATININE 2.1* 1.61*  CALCIUM 9.7 10.0   Liver Function Tests:  Recent Labs Lab 12/15/13 1109  12/18/13 0906 12/20/13 0846  AST 22 19 22   ALT 26 23 22   ALKPHOS 28* 28* 30*  BILITOT 0.5 0.3 0.3  PROT 7.3 7.5 7.6  ALBUMIN 3.8 3.8 3.5   No results found for this basename: LIPASE, AMYLASE,  in the last 168 hours No results found for this basename: AMMONIA,  in the last 168 hours CBC:  Recent Labs Lab 12/15/13 1109 12/20/13  0846  WBC 11.0* 8.0  HGB 14.3 15.3  HCT 42.8 45.0  MCV 93.6 92.8  PLT 237.0 229   Cardiac Enzymes:  Recent Labs Lab 12/20/13 0846  TROPONINI <0.30   BNP: No components found with this basename: POCBNP,  CBG: No results found for this basename: GLUCAP,  in the last 168 hours  If 7PM-7AM, please contact night-coverage [email or web address] Password [facility] 12/20/2013, 10:44 AM

## 2013-12-20 NOTE — ED Notes (Signed)
Per pt he has a significant cardiac hx including of CABG and stent placement.  Pt c/o left sided nagging chest pain x one week however became intolerable this am at 0400.  Pt c/o SOB as well.

## 2013-12-20 NOTE — ED Provider Notes (Signed)
CSN: [id number]     Arrival date & time 12/20/13  0710 History   First MD Initiated Contact with Patient 12/20/13 [phone]     Chief Complaint  Patient presents with  . Chest Pain     (Consider location/radiation/quality/duration/timing/severity/associated sxs/prior Treatment) HPI  68 yo male with hx of CAD (s/p CABG), UC, paroxysmal SVT here with 3 days of SOB and left sided chest pain. His SOB is worse with lying flat. CP is left sided without radiation, nagging in quality, mild, not changing with position or respiration. His pain is not same as the pain he had prior to CABG. He has chronic nagging pain on his mid and left abdomen due to UC but the CP is new. Denies n/v, leg swelling, hemoptysis, wheezing, chest tightness. Denies palpitations. He feels like he has a lot of gas and when he burps, the chest pain is relieved temporarily.   Has hx of pneumonia. He was also found to have an empyema and required drainage in the past.   Has baseline rectal bleed with UC - followed by GI and scheduled to get CT abd in few days.  He mentioned his wife had a cold recently. He usually catches cold easily. Patient has runny nose but no cough or fever.    Past Medical History  Diagnosis Date  . Diabetes mellitus   . Hypertension   . Cancer   . Empyema lung   . Nodule of left lung   . Chronic kidney disease   . CAD (coronary artery disease)   . Renal cell carcinoma     History of  . Amenia   . Cocaine abuse in remission   . Paroxysmal supraventricular tachycardia   . Hypothyroidism   . Hyperlipidemia   . Iron deficiency anemia   . Vitamin D deficiency   . Scrotal abscess   . Enlarged prostate   . Ulcerative colitis    Past Surgical History  Procedure Laterality Date  . Bronchoscopy, right video-assisted thoracoscopy, minithoracotomy, drainage of pleural effusion, and decortication.  02/26/2011    Gerhardt  . Tooth extraction    . Irrigation and debridement of scrotal wound and closure  of scrotal wound.    . Incision, drainage and debridement of perineum and  scrotum    . Cabg x 4  05/13/2009    Hendrickson  . Right radical nephrectomy.    Marland Kitchen Hernia repair    . Hemorrhoid surgery     Family History  Problem Relation Age of Onset  . Alzheimer's disease Mother   . Heart disease Father    History  Substance Use Topics  . Smoking status: Never Smoker   . Smokeless tobacco: Never Used  . Alcohol Use: No    Review of Systems  Constitutional: Negative for fever, chills, diaphoresis and fatigue.  HENT: Positive for congestion and rhinorrhea. Negative for drooling, ear discharge, ear pain, hearing loss, nosebleeds and sinus pressure.   Eyes: Negative.   Respiratory: Positive for shortness of breath. Negative for apnea, cough, choking, chest tightness and wheezing.   Gastrointestinal: Positive for abdominal pain, diarrhea, constipation, blood in stool and anal bleeding. Negative for vomiting and abdominal distention.  Endocrine: Negative.   Genitourinary: Negative for dysuria.  Musculoskeletal: Positive for back pain. Negative for neck pain and neck stiffness.  Allergic/Immunologic: Negative.   Neurological: Negative.   Hematological: Negative.   Psychiatric/Behavioral: Negative.       Allergies  Atorvastatin and Tetanus toxoids  Home Medications  Prior to Admission medications   Medication Sig Start Date End Date Taking? Authorizing Provider  albuterol (PROVENTIL HFA;VENTOLIN HFA) 108 (90 BASE) MCG/ACT inhaler Inhale into the lungs every 6 (six) hours as needed for wheezing or shortness of breath.   Yes Historical Provider, MD  aspirin 81 MG tablet Take 81 mg by mouth daily.   Yes Historical Provider, MD  buPROPion (WELLBUTRIN XL) 150 MG 24 hr tablet Take 150 mg by mouth every morning.   Yes Historical Provider, MD  capsaicin (ZOSTRIX) 0.025 % cream Apply 1 application topically 2 (two) times daily.   Yes Historical Provider, MD  cholecalciferol (VITAMIN D)  1000 UNITS tablet Take 3,000 Units by mouth daily.   Yes Historical Provider, MD  clotrimazole (LOTRIMIN) 1 % cream Apply 1 application topically 2 (two) times daily.   Yes Historical Provider, MD  fenofibrate 54 MG tablet Take 54 mg by mouth daily.   Yes Historical Provider, MD  ferrous sulfate dried (SLOW FE) 160 (50 FE) MG TBCR Take 160 mg by mouth 2 (two) times daily with a meal.     Yes Historical Provider, MD  gabapentin (NEURONTIN) 300 MG capsule Take 300 mg by mouth 2 times daily at 12 noon and 4 pm.     Yes Historical Provider, MD  glucose 4 GM chewable tablet Chew 1 tablet by mouth as needed for low blood sugar.   Yes Historical Provider, MD  hydrocortisone-pramoxine [facility]) 2.5-1 % rectal cream Place 1 application rectally 3 (three) times daily as needed for hemorrhoids or itching.    Yes Historical Provider, MD  insulin detemir (LEVEMIR) 100 UNIT/ML injection Inject 46 Units into the skin 2 (two) times daily.    Yes Historical Provider, MD  insulin lispro (HUMALOG KWIKPEN) 100 UNIT/ML KiwkPen Inject 16 Units into the skin 3 (three) times daily. Take as directed   Yes Historical Provider, MD  levothyroxine (SYNTHROID, LEVOTHROID) 100 MCG tablet Take 100 mcg by mouth daily before breakfast.    Yes Historical Provider, MD  LIDOCAINE EX Apply 1 application topically 2 (two) times daily as needed.   Yes Historical Provider, MD  Magnesium Oxide 420 MG TABS Take 1 tablet by mouth daily.   Yes Historical Provider, MD  mupirocin ointment (BACTROBAN) 2 % Place 1 application into the nose daily.   Yes Historical Provider, MD  nitroGLYCERIN (NITROSTAT) 0.4 MG SL tablet Place 0.4 mg under the tongue every 5 (five) minutes as needed for chest pain.    Yes Historical Provider, MD  nystatin cream (MYCOSTATIN) Apply 1 application topically 2 (two) times daily.   Yes Historical Provider, MD  Omega-3 Fatty Acids (OMEGA 3 PO) Take 4,000 mg by mouth 2 (two) times daily.    Yes Historical Provider, MD   omeprazole (PRILOSEC) 40 MG capsule Take 40 mg by mouth daily.    Yes Historical Provider, MD  oxyCODONE-acetaminophen (PERCOCET) 10-325 MG per tablet Take 1 tablet by mouth every 4 (four) hours as needed for pain.   Yes Historical Provider, MD  polyethylene glycol (MIRALAX / GLYCOLAX) packet Take 17 g by mouth daily.   Yes Historical Provider, MD  psyllium (REGULOID) 0.52 G capsule Take 0.52 g by mouth daily.   Yes Historical Provider, MD  tizanidine (ZANAFLEX) 2 MG capsule Take 2 mg by mouth as needed for muscle spasms.   Yes Historical Provider, MD  traZODone (DESYREL) 50 MG tablet Take 50 mg by mouth at bedtime.   Yes Historical Provider, MD   BP 133/92  Pulse 72  Temp(Src) 97.9 F (36.6 C) (Oral)  Resp 18  Ht 6' (1.829 m)  Wt 270 lb (122.471 kg)  BMI 36.61 kg/m2  SpO2 99% Physical Exam  Constitutional: He is oriented to person, place, and time. He appears well-developed and well-nourished.  HENT:  Head: Normocephalic and atraumatic.  Right Ear: External ear normal.  Left Ear: External ear normal.  Eyes: Conjunctivae and EOM are normal. Pupils are equal, round, and reactive to light. Right eye exhibits no discharge. Left eye exhibits no discharge.  Neck: Normal range of motion. Neck supple. JVD present.  Cardiovascular: Normal rate, regular rhythm and normal heart sounds.  Exam reveals no gallop and no friction rub.   No murmur heard. Pulmonary/Chest: Effort normal and breath sounds normal. No respiratory distress. He has no wheezes. He has no rales. He exhibits no tenderness.  Abdominal: Soft. Bowel sounds are normal. There is tenderness in the periumbilical area, left upper quadrant and left lower quadrant. There is no rebound, no guarding and no CVA tenderness.  Musculoskeletal: Normal range of motion. He exhibits no edema and no tenderness.  Neurological: He is alert and oriented to person, place, and time. He has normal reflexes. No cranial nerve deficit.  Skin: Skin is warm.   Psychiatric: He has a normal mood and affect. His behavior is normal.    ED Course  Procedures (including critical care time) Labs Review Labs Reviewed - No data to display  Imaging Review No results found.   EKG Interpretation   Date/Time:  Sunday December 20 2013 07:17:51 EDT Ventricular Rate:  129 PR Interval:    QRS Duration: 129 QT Interval:  341 QTC Calculation: 500 R Axis:   -83 Text Interpretation:  Atrial fibrillation Right bundle branch block  Nonspecific ST abnormality Confirmed by STEINL  MD, Lennette Bihari ([id number]) on  12/20/2013 7:54:58 AM      EKG showed Afib with RVR 129 on monitor he had some PVCs and then back to NSR.  Troponin, CXR, BNP, CBC,BMP.  Continues to have mild CP. trp negative.     MDM   Final diagnoses:  None   Patient has hx of CABG and stents, had a run of Afib with RVR 129, now back to NSR. Will admit to hospital ist for rule out of cardiac etiology of chest pain.

## 2013-12-20 NOTE — Progress Notes (Signed)
Pt HR irregular from 110's to 140's sustaining. EKG done-A-Fib with RVR RtBBB,Lt fascicular Infarct,Inferior Infarct age undetermined. Pt asymptomatic. On call Kathline Magic NP made aware. New order received to give CardiZem 10 mg bolus. Will cont to monitor pt closely.  12/20/13 2104  Vitals  Temp 98.1 F (36.7 C)  Temp src Oral  BP 121/67 mmHg  BP Location Right arm  BP Method Automatic  Patient Position (if appropriate) Lying  Pulse Rate Source Dinamap  Resp 18  Oxygen Therapy  SpO2 98 %  O2 Device None (Room air)

## 2013-12-21 ENCOUNTER — Encounter (HOSPITAL_COMMUNITY): Payer: Self-pay | Admitting: Physician Assistant

## 2013-12-21 DIAGNOSIS — R109 Unspecified abdominal pain: Secondary | ICD-10-CM

## 2013-12-21 DIAGNOSIS — I714 Abdominal aortic aneurysm, without rupture, unspecified: Secondary | ICD-10-CM

## 2013-12-21 DIAGNOSIS — R0602 Shortness of breath: Secondary | ICD-10-CM | POA: Diagnosis not present

## 2013-12-21 DIAGNOSIS — I4510 Unspecified right bundle-branch block: Secondary | ICD-10-CM

## 2013-12-21 DIAGNOSIS — N183 Chronic kidney disease, stage 3 unspecified: Secondary | ICD-10-CM

## 2013-12-21 DIAGNOSIS — I519 Heart disease, unspecified: Secondary | ICD-10-CM | POA: Diagnosis present

## 2013-12-21 DIAGNOSIS — I25810 Atherosclerosis of coronary artery bypass graft(s) without angina pectoris: Secondary | ICD-10-CM

## 2013-12-21 DIAGNOSIS — I480 Paroxysmal atrial fibrillation: Secondary | ICD-10-CM | POA: Diagnosis present

## 2013-12-21 DIAGNOSIS — I517 Cardiomegaly: Secondary | ICD-10-CM

## 2013-12-21 DIAGNOSIS — I5022 Chronic systolic (congestive) heart failure: Secondary | ICD-10-CM

## 2013-12-21 DIAGNOSIS — G8929 Other chronic pain: Secondary | ICD-10-CM | POA: Diagnosis present

## 2013-12-21 DIAGNOSIS — E785 Hyperlipidemia, unspecified: Secondary | ICD-10-CM | POA: Diagnosis not present

## 2013-12-21 DIAGNOSIS — R0789 Other chest pain: Secondary | ICD-10-CM | POA: Diagnosis not present

## 2013-12-21 DIAGNOSIS — I444 Left anterior fascicular block: Secondary | ICD-10-CM | POA: Diagnosis present

## 2013-12-21 DIAGNOSIS — I471 Supraventricular tachycardia: Secondary | ICD-10-CM

## 2013-12-21 DIAGNOSIS — Z85118 Personal history of other malignant neoplasm of bronchus and lung: Secondary | ICD-10-CM

## 2013-12-21 DIAGNOSIS — R079 Chest pain, unspecified: Secondary | ICD-10-CM | POA: Diagnosis not present

## 2013-12-21 DIAGNOSIS — E119 Type 2 diabetes mellitus without complications: Secondary | ICD-10-CM | POA: Diagnosis not present

## 2013-12-21 LAB — MRSA PCR SCREENING: MRSA BY PCR: POSITIVE — AB

## 2013-12-21 LAB — GLUCOSE, CAPILLARY
GLUCOSE-CAPILLARY: 192 mg/dL — AB (ref 70–99)
GLUCOSE-CAPILLARY: 130 mg/dL — AB (ref 70–99)
Glucose-Capillary: 99 mg/dL (ref 70–99)
Glucose-Capillary: 117 mg/dL — ABNORMAL HIGH (ref 70–99)

## 2013-12-21 LAB — TROPONIN I
Troponin I: <0.3 ng/mL (ref <0.30)
Troponin I: <0.3 ng/mL (ref <0.30)

## 2013-12-21 MED ORDER — MUPIROCIN CALCIUM 2 % EX CREA
TOPICAL_CREAM | Freq: Two times a day (BID) | CUTANEOUS | Status: DC
  Administered 2013-12-21 – 2013-12-22 (×2): via TOPICAL
  Filled 2013-12-21: qty 15

## 2013-12-21 MED ORDER — CHLORHEXIDINE GLUCONATE CLOTH 2 % EX PADS
6.0000 | MEDICATED_PAD | Freq: Every day | CUTANEOUS | Status: DC
  Administered 2013-12-22: 6 via TOPICAL

## 2013-12-21 MED ORDER — DEXTROSE 5 % IV SOLN
5.0000 mg/h | INTRAVENOUS | Status: DC
  Administered 2013-12-21: 5 mg/h via INTRAVENOUS
  Administered 2013-12-21: 10 mg/h via INTRAVENOUS
  Administered 2013-12-21: 5 mg/h via INTRAVENOUS
  Filled 2013-12-21 (×3): qty 100

## 2013-12-21 MED ORDER — AMIODARONE HCL 200 MG PO TABS
400.0000 mg | ORAL_TABLET | Freq: Two times a day (BID) | ORAL | Status: DC
  Administered 2013-12-21 – 2013-12-22 (×2): 400 mg via ORAL
  Filled 2013-12-21 (×3): qty 2

## 2013-12-21 NOTE — Progress Notes (Signed)
*  PRELIMINARY RESULTS* Echocardiogram 2D Echocardiogram has been performed.  Leavy Cella 12/21/2013, 11:47 AM

## 2013-12-21 NOTE — Consult Note (Addendum)
Agree with the note by Mrs. Matthew [name].   This is a complicated situation that is complicated by a very vague history in a man with multiple medical problems including CKD 3, SHF,  CAD with prior bypass. CP history is atypical and inconsistent. He has not had an MI. He is in the process of having a GI w/u for bleeding and other symptoms. He does have PAF that was previously unknown other than post CABG.  Plan is to suppress AF with amiodarone, and wait to start anticoagulation after OP GI w/u complete. CHADS 2 is > 4. I am not recommending cath or ischemic evaluation given relatively recent surgery and unremarkable ECG's, markers, and atypical story.Needs baseline PFT's.

## 2013-12-21 NOTE — Progress Notes (Signed)
[facility] PROGRESS NOTE   Matthew [name]:[id number] DOB: 1946-02-04 DOA: 12/20/2013 PCP: Gavin Pound, MD  HPI/Subjective: Denies any chest pain, had atrial fibrillation with controlled ventricular response earlier. Patient started on Cardizem drip.  Assessment/Plan: Principal Problem:   PAF (paroxysmal atrial fibrillation) Active Problems:   CAD (coronary artery disease)   Renal cell carcinoma   Chest pain   Chronic abdominal pain   CKD (chronic kidney disease), stage III   H/O: lung cancer   LV dysfunction   RBBB   LAFB (left anterior fascicular block)   AAA (abdominal aortic aneurysm)    Chest pain / history of CAD status post CABG   Rule out ACS; the first troponin level was WNL. Cycle cardiac enzymes, obtain 2 D ECHO.   The 12 lead EKG showed atrial fibrillation (rate 60); repeat admission EKG   BNP on admission 289.   Continue aspirin 81 mg daily.   Continue fenofibrate and omega 3   Atrial fibrillation  Patient developed atrial fibrillation with rapid ventricular response earlier today.  Started on Cardizem drip, cardiology consulted.  Cardiology recommended to start amiodarone and probably anticoagulation after his anticipated GI workup.  Patient has history of chronic rectal bleed, currently being worked up, if GI okayed he'll need anticoagulation.  CKD, stage 3 GFR 31-60  Creatinine in 2012 1.7 and on this admission 1.61 so seems to be in baseline range  Diabetes, with renal manifestations  Check A1c  Restart home insulin regimen: Levemir 40 units BID and novolog 16 units TID  Glycemic order set in place   History of tinea  Continue home topical ointments   Hypothyroidism  Continue synthroid 100 mcg daily    Code Status: Full code Family Communication: Plan discussed with the patient. Disposition Plan: Remains inpatient   Consultants:  Cardiology  Procedures:  None  Antibiotics:  None   Objective: Filed  Vitals:   12/21/13 1329  BP: 124/71  Pulse: 101  Temp: 98.6 F (37 C)  Resp: 18    Intake/Output Summary (Last 24 hours) at 12/21/13 1551 Last data filed at 12/21/13 1028  Gross per 24 hour  Intake    960 ml  Output   2525 ml  Net  -1565 ml   Filed Weights   12/20/13 0720 12/20/13 1105  Weight: 122.471 kg (270 lb) 119.5 kg (263 lb 7.2 oz)    Exam: General: Alert and awake, oriented x3, not in any acute distress. HEENT: anicteric sclera, pupils reactive to light and accommodation, EOMI CVS: S1-S2 clear, no murmur rubs or gallops Chest: clear to auscultation bilaterally, no wheezing, rales or rhonchi Abdomen: soft nontender, nondistended, normal bowel sounds, no organomegaly Extremities: no cyanosis, clubbing or edema noted bilaterally Neuro: Cranial nerves II-XII intact, no focal neurological deficits  Data Reviewed: Basic Metabolic Panel:  Recent Labs Lab 12/15/13 1109 12/20/13 0846  NA 135 139  K 4.8 4.5  CL 98 101  CO2 31 22  GLUCOSE 346* 138*  BUN 23 28*  CREATININE 2.1* 1.61*  CALCIUM 9.7 10.0   Liver Function Tests:  Recent Labs Lab 12/15/13 1109 12/18/13 0906 12/20/13 0846  AST 22 19 22   ALT 26 23 22   ALKPHOS 28* 28* 30*  BILITOT 0.5 0.3 0.3  PROT 7.3 7.5 7.6  ALBUMIN 3.8 3.8 3.5   No results found for this basename: LIPASE, AMYLASE,  in the last 168 hours No results found for this basename: AMMONIA,  in the last 168 hours CBC:  Recent  Labs Lab 12/15/13 1109 12/20/13 0846  WBC 11.0* 8.0  HGB 14.3 15.3  HCT 42.8 45.0  MCV 93.6 92.8  PLT 237.0 229   Cardiac Enzymes:  Recent Labs Lab 12/20/13 0846 12/20/13 1154 12/20/13 1807 12/20/13 2354 12/21/13 0506  TROPONINI <0.30 <0.30 <0.30 <0.30 <0.30   BNP (last 3 results)  Recent Labs  12/20/13 0830  PROBNP 288.7*   CBG:  Recent Labs Lab 12/20/13 1246 12/20/13 1631 12/20/13 2219 12/21/13 0728 12/21/13 1157  GLUCAP 227* 331* 196* 192* 130*    Micro No results found  for this or any previous visit (from the past 240 hour(s)).   Studies: Dg Chest 2 View  12/20/2013   CLINICAL DATA:  Chest pain and tightness  EXAM: CHEST  2 VIEW  COMPARISON:  04/02/2011  FINDINGS: Midline sternotomy. Normal mediastinum and cardiac silhouette. Normal pulmonary vasculature. No evidence of effusion, infiltrate, or pneumothorax. No acute bony abnormality.  IMPRESSION: No acute cardiopulmonary process.   Electronically Signed   By: Suzy Bouchard M.D.   On: 12/20/2013 09:16    Scheduled Meds: . amiodarone  400 mg Oral BID  . aspirin  81 mg Oral Daily  . buPROPion  150 mg Oral q morning - 10a  . capsaicin  1 application Topical BID  . cholecalciferol  3,000 Units Oral Daily  . clotrimazole  1 application Topical BID  . enoxaparin (LOVENOX) injection  30 mg Subcutaneous Q24H  . fenofibrate  54 mg Oral Daily  . ferrous sulfate  325 mg Oral Q breakfast  . gabapentin  300 mg Oral q12n4p  . insulin aspart  0-15 Units Subcutaneous TID WC  . insulin aspart  16 Units Subcutaneous TID WC  . insulin detemir  46 Units Subcutaneous BID  . levothyroxine  100 mcg Oral QAC breakfast  . magnesium oxide  400 mg Oral Daily  . nystatin cream  1 application Topical BID  . omega-3 acid ethyl esters  2 g Oral BID  . pantoprazole  40 mg Oral Daily  . polyethylene glycol  17 g Oral Daily  . psyllium  1 packet Oral Daily  . traZODone  50 mg Oral QHS   Continuous Infusions: . sodium chloride 20 mL/hr at 12/20/13 0837  . diltiazem (CARDIZEM) infusion 10 mg/hr (12/21/13 1358)       Time spent: 35 minutes    [facility]  [facility] Pager [phone] If 7PM-7AM, please contact night-coverage at [email or web address], password [facility] 12/21/2013, 3:51 PM  LOS: 1 day

## 2013-12-21 NOTE — Progress Notes (Signed)
Patient HR started increasing again 110's-140's.Pt asymptomatic.EKG results afib w RVR 114. On call NP T Rogue Bussing made aware. New order received to start cardizem drip.

## 2013-12-21 NOTE — Consult Note (Signed)
Cardiology Consultation Note  Patient ID: Matthew [name], MRN: [id number], DOB/AGE: 02-19-1946 68 y.o. Admit date: 12/20/2013   Date of Consult: 12/21/2013 Primary Physician: Gavin Pound, MD - also followed at the [facility] Primary Cardiologist: Irish Lack  Chief Complaint: fatigue, chest pain Reason for Consult: CP, PAF (in and out during this admission)  HPI: Matthew [name] is a 68 y/o M with history of CAD s/p CABGx4 in 05/2009 c/b post-op AF, complicated admission 07/9922 for empyema requiring chest tube/decortication with PSVT seen then, lung cancer 2002 s/p interleukin per pt, renal cell carcinoma s/p R nephrectomy 2002 said to be metastatic per chart, pulm nodules in 2012, CKD stage III, LV dysfunction, remote cocaine use, HTN who presented to [facility] with complaints of fatigue. He was recently seen by Dr. Hilarie Fredrickson for abdominal pain, alternating bowel habits (constipation predominant, felt possibly due to narcotics), & spotty inconsistent rectal bleeding - there was no objective evidence of ulcerative colitis or ulcerative proctitis. The patient was scheduled for abdominal CT and colonscopy to further evaluate, pending for 7/17. Last ischemic eval was by nuc 12/2012 - showed a small in size and mild in intensity perfusion defect in the apical inferior walls with mild defect reversibility, EF 50%. Continued medical therapy was advised, with note to consider angiography only if he has refractory CP. He states he is no longer followed by oncology and considers himself to be in remission. Last imaging was in 02/2011 at time of empyema with lung nodules but it's not clear that these have been re-imaged since that time.  He presented to the ER reportedly with chest discomfort however history is somewhat inconsistent. Upon our interview he denied this as his chief complaint, reporting that the reason he came in was because he was "not feeling like myself" with increased fatigue. He had been feeling  this way for 4-5 days. Per ER notes, he had reported nagging chest pain for 3 days. To internal medicine, he reported 1 week of left sided chest pain reaching 8/10 in intensity. The patient denies this was the case, suggesting that he maybe had only minutes of tightness prompting him to come to the ER. He reports that this occurred at rest, after going to sit up, and felt like gas. It resolved spontaneously and he has not had any pain since. He does not exercise, but has not had any recent exertional chest CP or dyspnea with ADL's. He recently bought a [employer] and even his wife noticed over the weekend he just wasn't feeling well enough to go out and ride around. He spent most of the weekend resting. This wasn't due to chest pain he says, but rather a sense of malaise.  During this admission he has been found to be in and out of atrial fib rates 140-160s, then back to NSR (with baseline RBBB/LAFB on EKG). He is unaware of the transition between the two and denies any palpitations, nausea, vomiting, h/o stroke, syncope, LEE, orthopnea, PND. He has ruled out for MI. Cr stable. Diabetes uncontrolled A1C 6.8. UDS normal. He reports intermittent spotty BRBPR which he attributes to this question of colitis. Denies prior need for blood transfusion. Was given a dilt bolus then is on a drip at 59m/hr and currently in NSR. He is not tachypnic, SOB or hypoxic.  Past Medical History  Diagnosis Date  . Diabetes mellitus   . Hypertension   . Empyema lung     a. PNA 02/2011 c/b empyema requiring chest tube, drainage  of pleural effusion and decortication.  . Nodule of left lung     a. Cavitary nodules 02/2011, f/u recommended.  . CKD (chronic kidney disease), stage III   . CAD (coronary artery disease)     a. s/p CABGx4 05/2009.  Marland Kitchen Renal cell carcinoma     a. s/p R nephrectomy 10/2010. Reported pulmonary mets per note at that time.  . Anemia   . Cocaine abuse in remission   . Paroxysmal supraventricular  tachycardia     a. While inpatient 02/2011 sick with empyema - wide appearace due to RBBB.  Marland Kitchen Hypothyroidism   . Hyperlipidemia   . Iron deficiency anemia   . Vitamin D deficiency   . Scrotal abscess     a. 2012 2/2 Streptococcus group C, status post incision and drainage.  . Enlarged prostate   . Atrial fibrillation     a. Post-op CABG 05/2009. b. Re-identified 12/2013.  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  . Colon polyps   . Chronic constipation   . Obesity   . AAA (abdominal aortic aneurysm)     a. Aortic US 05/2012: 3.9 cm fusiform abdominal aortic aneurysm. F/u recommended 05/2014.  Marland Kitchen RBBB   . LAFB (left anterior fascicular block)   . Lung cancer     a. Tx with interleukin in 2002 per patient.      Most Recent Cardiac Studies: Nuc 12/2012: Diaphragmatic attenuation is mild. Stress images show a small in size and mild in intensity perfusion defect in the apical inferior walls. Resting images reveal mild defect reversibility. Post-stress EF 50%. Execise capacity 7.0 METS. Continue medical therapy. Consider angiography only if he has refractory CP.  2D Echo 12/21/13 - Left ventricle: The cavity size was normal. Wall thickness was increased in a pattern of mild LVH. Systolic function was mildly to moderately reduced. The estimated ejection fraction was in the range of 40% to 45%. Hypokinesis of the apical myocardium. Doppler parameters are consistent with abnormal left ventricular relaxation (grade 1 diastolic dysfunction).   02/2011 Echo (note - f/u echo did not show LV thrombus, EF was 40-45% during same adm) - Left ventricle: The cavity size was normal. Systolic function was   moderately reduced. The estimated ejection fraction was in the   range of 35% to 40%. There is severe hypokinesis to akinesis of   the mid-distal anteroseptal and apical myocardium. Features are   consistent with a pseudonormal left ventricular filling pattern,   with concomitant  abnormal relaxation and increased filling   pressure (grade 2 diastolic dysfunction). Doppler parameters are   consistent with both elevated ventricular end-diastolic filling   pressure and elevated left atrial filling pressure. Cannot exclude   apical lateral thrombus. - Aortic valve: Trileaflet; mildly thickened, mildly calcified   leaflets. Cannot exclude a small vegetation in the region of the   right coronary cusp. Recommend TEE for further evaluation. Trivial   regurgitation. - Mitral valve: Calcified annulus. Mildly thickened leaflets . Mild   regurgitation. - Left atrium: The atrium was mildly dilated. - Right ventricle: The cavity size was mildly dilated. Systolic   pressure was increased. - Atrial septum: No defect or patent foramen ovale was identified. - Pulmonary arteries: PA peak pressure: 52m Hg (S). - Pericardium, extracardiac: A trivial pericardial effusion was   identified. Impressions:- The right ventricular systolic pressure was increased consistent   with moderate pulmonary hypertension.   Cardiac Cath 05/2009 prior to CABG  DATE OF PROCEDURE:  05/11/2009 DATE OF DISCHARGE: CARDIAC CATHETERIZATION REFERRING PHYSICIAN:  Elveria Rising. Damita Dunnings, MD PROCEDURES PERFORMED:  Left heart catheterization, left ventriculogram, coronary angiogram, abdominal aortogram. OPERATOR:  Jettie Booze, MD INDICATIONS:  Abnormal stress test, prior MI. PROCEDURE NARRATIVE:  The risks and benefits of cardiac catheterization were explained to the patient and informed consent was obtained.  He was brought to the cath lab.  He was prepped and draped in the usual sterile fashion.  His right groin was infiltrated with 1% lidocaine.  A 6-French sheath was placed into the right femoral artery using the modified Seldinger technique.  Left coronary artery angiography was performed using a JL-4.0 pigtail catheter.  The catheter was advanced to the vessel ostium under fluoroscopic guidance.   Digital angiography was performed in multiple projections using hand injection of contrast. Right coronary artery angiography was then performed using JL-4.0 pigtail catheter in a similar fashion.  Selective angiography of the left subclavian artery was performed using the JR-4 catheter.  Pigtail catheter was advanced to the ascending aorta and across the aortic valve under fluoroscopic guidance.  Power injection contrast performed in the RAO projection to image the left ventricle.  Catheter was then pulled back under continuous hemodynamic pressure monitoring.  Catheter was withdrawn to the abdominal aorta and a power injection of contrast performed in AP projection to image the abdominal aorta.  The sheath was removed using manual compression. FINDINGS:  The left main was a large short vessel, but was widely patent. Left circumflex is a large vessel with mild proximal disease.  The OM-1 was a large vessel with 80% proximal stenosis in the continuation of the circumflex which led to a medium-sized OM-2.  There was also an 80% stenosis. Left anterior descending had a 60% to 70% proximal stenosis.  The stent in the mid vessel was patent.  At the distal edge of the stent, there appeared to be 99% subtotal occlusion.  The first diagonal was small, but patent.  The second diagonal was large and patent.  There were faint left-to-right collaterals coming from the septal branches. The right coronary artery is occluded in the mid vessel.  The distal vessel is not well visualized because of the poor flow. Left subclavian is widely patent. HEMODYNAMICS:  Left ventricular pressure 115/8 with an LVEDP of 19 mmHg. Aortic pressure 115/66 with a mean aortic pressure of 87 mmHg. Left ventriculogram showed apical akinesis and anterior wall hypokinesis.  The estimated ejection fraction is 30%. The abdominal aortogram showed an occluded right renal.  There is a small infrarenal abdominal aortic  aneurysm. IMPRESSION: 1. Three-vessel coronary artery disease. 2. Decreased left ventricular function. 3. Abdominal aortic aneurysm. 4. Occluded right renal artery. RECOMMENDATIONS:  Continue aspirin.  We will obtain CVTS consult.  The patient has already expressed hesitation about bypass surgery.  If he does refuse bypass surgery, we will have to consider some percutaneous options.  I would still like to have the surgeon talk to the patient more in depth about surgery since I think that is his best option for revascularization.    Surgical History:  Past Surgical History  Procedure Laterality Date  . Bronchoscopy, right video-assisted thoracoscopy, minithoracotomy, drainage of pleural effusion, and decortication.  02/26/2011    Gerhardt  . Tooth extraction    . Irrigation and debridement of scrotal wound and closure of scrotal wound.    . Incision, drainage and debridement of perineum and  scrotum    . Cabg x 4  05/13/2009    Hendrickson  . Right radical nephrectomy.    Marland Kitchen Hernia repair    . Hemorrhoid surgery       Home Meds: Prior to Admission medications   Medication Sig Start Date End Date Taking? Authorizing Provider  albuterol (PROVENTIL HFA;VENTOLIN HFA) 108 (90 BASE) MCG/ACT inhaler Inhale into the lungs every 6 (six) hours as needed for wheezing or shortness of breath.   Yes Historical Provider, MD  aspirin 81 MG tablet Take 81 mg by mouth daily.   Yes Historical Provider, MD  buPROPion (WELLBUTRIN XL) 150 MG 24 hr tablet Take 150 mg by mouth every morning.   Yes Historical Provider, MD  capsaicin (ZOSTRIX) 0.025 % cream Apply 1 application topically 2 (two) times daily.   Yes Historical Provider, MD  cholecalciferol (VITAMIN D) 1000 UNITS tablet Take 3,000 Units by mouth daily.   Yes Historical Provider, MD  clotrimazole (LOTRIMIN) 1 % cream Apply 1 application topically 2 (two) times daily.   Yes Historical Provider, MD  fenofibrate 54 MG tablet Take 54 mg by mouth  daily.   Yes Historical Provider, MD  ferrous sulfate dried (SLOW FE) 160 (50 FE) MG TBCR Take 160 mg by mouth 2 (two) times daily with a meal.     Yes Historical Provider, MD  gabapentin (NEURONTIN) 300 MG capsule Take 300 mg by mouth 2 times daily at 12 noon and 4 pm.     Yes Historical Provider, MD  glucose 4 GM chewable tablet Chew 1 tablet by mouth as needed for low blood sugar.   Yes Historical Provider, MD  hydrocortisone-pramoxine [facility]) 2.5-1 % rectal cream Place 1 application rectally 3 (three) times daily as needed for hemorrhoids or itching.    Yes Historical Provider, MD  insulin detemir (LEVEMIR) 100 UNIT/ML injection Inject 46 Units into the skin 2 (two) times daily.    Yes Historical Provider, MD  insulin lispro (HUMALOG KWIKPEN) 100 UNIT/ML KiwkPen Inject 16 Units into the skin 3 (three) times daily. Take as directed   Yes Historical Provider, MD  levothyroxine (SYNTHROID, LEVOTHROID) 100 MCG tablet Take 100 mcg by mouth daily before breakfast.    Yes Historical Provider, MD  LIDOCAINE EX Apply 1 application topically 2 (two) times daily as needed.   Yes Historical Provider, MD  Magnesium Oxide 420 MG TABS Take 1 tablet by mouth daily.   Yes Historical Provider, MD  mupirocin ointment (BACTROBAN) 2 % Place 1 application into the nose daily.   Yes Historical Provider, MD  nitroGLYCERIN (NITROSTAT) 0.4 MG SL tablet Place 0.4 mg under the tongue every 5 (five) minutes as needed for chest pain.    Yes Historical Provider, MD  nystatin cream (MYCOSTATIN) Apply 1 application topically 2 (two) times daily.   Yes Historical Provider, MD  Omega-3 Fatty Acids (OMEGA 3 PO) Take 4,000 mg by mouth 2 (two) times daily.    Yes Historical Provider, MD  omeprazole (PRILOSEC) 40 MG capsule Take 40 mg by mouth daily.    Yes Historical Provider, MD  oxyCODONE-acetaminophen (PERCOCET) 10-325 MG per tablet Take 1 tablet by mouth every 4 (four) hours as needed for pain.   Yes Historical Provider, MD   polyethylene glycol (MIRALAX / GLYCOLAX) packet Take 17 g by mouth daily.   Yes Historical Provider, MD  psyllium (REGULOID) 0.52 G capsule Take 0.52 g by mouth daily.   Yes Historical Provider, MD  tizanidine (ZANAFLEX) 2 MG capsule Take 2 mg by mouth as needed for muscle  spasms.   Yes Historical Provider, MD  traZODone (DESYREL) 50 MG tablet Take 50 mg by mouth at bedtime.   Yes Historical Provider, MD    Inpatient Medications:  . aspirin  81 mg Oral Daily  . buPROPion  150 mg Oral q morning - 10a  . capsaicin  1 application Topical BID  . cholecalciferol  3,000 Units Oral Daily  . clotrimazole  1 application Topical BID  . enoxaparin (LOVENOX) injection  30 mg Subcutaneous Q24H  . fenofibrate  54 mg Oral Daily  . ferrous sulfate  325 mg Oral Q breakfast  . gabapentin  300 mg Oral q12n4p  . insulin aspart  0-15 Units Subcutaneous TID WC  . insulin aspart  16 Units Subcutaneous TID WC  . insulin detemir  46 Units Subcutaneous BID  . levothyroxine  100 mcg Oral QAC breakfast  . magnesium oxide  400 mg Oral Daily  . nystatin cream  1 application Topical BID  . omega-3 acid ethyl esters  2 g Oral BID  . pantoprazole  40 mg Oral Daily  . polyethylene glycol  17 g Oral Daily  . psyllium  1 packet Oral Daily  . traZODone  50 mg Oral QHS   . sodium chloride 20 mL/hr at 12/20/13 0837  . diltiazem (CARDIZEM) infusion 10 mg/hr (12/21/13 1358)    Allergies:  Allergies  Allergen Reactions  . Atorvastatin Other (See Comments)    Leg muscle cramps  . Tetanus Toxoids Hives    History   Social History  . Marital Status: Married    Spouse Name: N/A    Number of Children: 4  . Years of Education: N/A   Occupational History  . CAR SERVICE    Social History Main Topics  . Smoking status: Never Smoker   . Smokeless tobacco: Never Used  . Alcohol Use: No  . Drug Use: No  . Sexual Activity: Not on file   Other Topics Concern  . Not on file   Social History Narrative  . No  narrative on file     Family History  Problem Relation Age of Onset  . Alzheimer's disease Mother   . Heart disease Father   . Pneumonia       Review of Systems: General: negative for chills, fever, night sweats or weight changes.  Cardiovascular: see above Dermatological: negative for rash Respiratory: negative for cough or wheezing Urologic: negative for hematuria Abdominal: see above Neurologic: negative for visual changes, syncope, or dizziness All other systems reviewed and are otherwise negative except as noted above.  Labs:  Recent Labs  12/20/13 1154 12/20/13 1807 12/20/13 2354 12/21/13 0506  TROPONINI <0.30 <0.30 <0.30 <0.30   Lab Results  Component Value Date   WBC 8.0 12/20/2013   HGB 15.3 12/20/2013   HCT 45.0 12/20/2013   MCV 92.8 12/20/2013   PLT 229 12/20/2013     Recent Labs Lab 12/20/13 0846  NA 139  K 4.5  CL 101  CO2 22  BUN 28*  CREATININE 1.61*  CALCIUM 10.0  PROT 7.6  BILITOT 0.3  ALKPHOS 30*  ALT 22  AST 22  GLUCOSE 138*   Lab Results  Component Value Date   CHOL 237* 12/18/2013   HDL 34.00* 12/18/2013   LDLCALC 131* 12/18/2013   TRIG 358.0* 12/18/2013   No results found for this basename: DDIMER    Radiology/Studies:  Dg Chest 2 View 12/20/2013   CLINICAL DATA:  Chest pain and tightness  EXAM:  CHEST  2 VIEW  COMPARISON:  04/02/2011  FINDINGS: Midline sternotomy. Normal mediastinum and cardiac silhouette. Normal pulmonary vasculature. No evidence of effusion, infiltrate, or pneumothorax. No acute bony abnormality.  IMPRESSION: No acute cardiopulmonary process.   Electronically Signed   By: Suzy Bouchard M.D.   On: 12/20/2013 09:16   EKG: multiple tracings with AF RVR of varying rates, RBBB, bifascicular block, prior septal/inferiro infarct, nonspecific ST-T changes Also has a NSR EKG from 7/12 at 15:45 with RBBB, LAFB, nonspecific ST-T changes, not significantly changed from prior  Physical Exam Blood pressure 124/71, pulse 101,  temperature 98.6 F (37 C), temperature source Oral, resp. rate 18, height 6' (1.829 m), weight 263 lb 7.2 oz (119.5 kg), SpO2 98.00%. General: Well developed, well nourished, in no acute distress. Lively Head: Normocephalic, atraumatic, sclera non-icteric, no xanthomas, nares are without discharge.  Neck: Negative for carotid bruits. JVD not elevated. Lungs: Clear bilaterally to auscultation without wheezes, rales, or rhonchi. Breathing is unlabored. Heart: RRR with S1 S2. No murmurs, rubs, or gallops appreciated. Abdomen: Soft, non-tender, non-distended with normoactive bowel sounds. No hepatomegaly. No rebound/guarding. No obvious abdominal masses. Msk:  Strength and tone appear normal for age. Extremities: No clubbing or cyanosis. No edema.  Distal pedal pulses are 2+ and equal bilaterally. Neuro: Alert and oriented X 3. No facial asymmetry. No focal deficit. Moves all extremities spontaneously. Psych:  Responds to questions appropriately with a normal affect.   Assessment and Plan: 68 y/o M with CAD s/p CABG 2010 c/b post-op AF, empyema 02/2011 with PSVT at that time,, reported metastatic renal cell carcinoma s/p nephrectomy with CKD stage III, remote lung cancer, small AAA, remote colitis (repeat workup underway), LV dysfunction, hypothyroidism, HTN, remote cocaine admitted with possible chest pain and found to have PAF on telemetry. He has been in and out of PAF/NSR this admission. CHADSVASC = 5. Last nuc 12/2012 abnormal but treated medically.  1. Paroxysmal atrial fibrillation, first recurrence since CABG 2. CAD s/p CABG 05/2009 with chest discomfort 3. Abdominal pain/intermittent rectal bleeding, remote history of ulcerative proctitis 4. CKD stage III, appears stable 5 AAA 3.9cm, f/u due 05/2014 6. LV dysfunction EF 40-45%, has fluctuated over the years 7. Metastatic renal cell cancer with reported history of ?lung cancer as well, nodules seen in 2012 - pt says he is no longer followed  by oncology. We encouraged follow-up with primary doctor to discern if any further surveillance is needed for the nodules seen in 2012 8. Hyperlipidemia, has f/u in lipid clinic  With regard to CP, he had mildly abnormal nuc in 12/2012 and has been managed medically. Given recurrence of AF, ongoing GI workup, CKD, and no objective evidence of ischemia, favor continued medical therapy and observation for recurrent symptoms. This may have been due to his AF. The patient knows to alert his team if chest pain recurs. Regarding AF, his situation is not straightforward given his ongoing GI workup as well as bifascicular block. Per discussion with MD, would prefer to initiate rhythm control for now and hold off starting full anticoagulation until GI studies are completed later this week. Continue aspirin as tolerated. Start amiodarone 479m BID. Will order baseline pulmonary function testing. TSH normal last week, LFTs unremarkable. If he tolerates amiodarone OK, will need routine monitoring of these organ systems. Will leave on IV diltiazem overnight but plan to try to wean tomorrow.   Signed, Dayna Dunn PA-C 12/21/2013, 2:15 PM

## 2013-12-22 ENCOUNTER — Ambulatory Visit: Payer: Self-pay | Admitting: Pharmacist

## 2013-12-22 ENCOUNTER — Observation Stay (HOSPITAL_COMMUNITY): Payer: 59

## 2013-12-22 DIAGNOSIS — I4891 Unspecified atrial fibrillation: Secondary | ICD-10-CM | POA: Diagnosis not present

## 2013-12-22 DIAGNOSIS — R079 Chest pain, unspecified: Secondary | ICD-10-CM | POA: Diagnosis not present

## 2013-12-22 DIAGNOSIS — N183 Chronic kidney disease, stage 3 unspecified: Secondary | ICD-10-CM | POA: Diagnosis not present

## 2013-12-22 DIAGNOSIS — I25810 Atherosclerosis of coronary artery bypass graft(s) without angina pectoris: Secondary | ICD-10-CM | POA: Diagnosis not present

## 2013-12-22 DIAGNOSIS — R0789 Other chest pain: Secondary | ICD-10-CM | POA: Diagnosis not present

## 2013-12-22 DIAGNOSIS — I10 Essential (primary) hypertension: Secondary | ICD-10-CM | POA: Diagnosis not present

## 2013-12-22 DIAGNOSIS — E119 Type 2 diabetes mellitus without complications: Secondary | ICD-10-CM | POA: Diagnosis not present

## 2013-12-22 DIAGNOSIS — E785 Hyperlipidemia, unspecified: Secondary | ICD-10-CM | POA: Diagnosis not present

## 2013-12-22 DIAGNOSIS — I714 Abdominal aortic aneurysm, without rupture, unspecified: Secondary | ICD-10-CM | POA: Diagnosis not present

## 2013-12-22 DIAGNOSIS — I2510 Atherosclerotic heart disease of native coronary artery without angina pectoris: Secondary | ICD-10-CM | POA: Diagnosis not present

## 2013-12-22 DIAGNOSIS — Z85118 Personal history of other malignant neoplasm of bronchus and lung: Secondary | ICD-10-CM

## 2013-12-22 DIAGNOSIS — I519 Heart disease, unspecified: Secondary | ICD-10-CM

## 2013-12-22 DIAGNOSIS — R0602 Shortness of breath: Secondary | ICD-10-CM | POA: Diagnosis not present

## 2013-12-22 LAB — RENAL FUNCTION PANEL
ANION GAP: 15 (ref 5–15)
Albumin: 3.4 g/dL — ABNORMAL LOW (ref 3.5–5.2)
BUN: 27 mg/dL — AB (ref 6–23)
CHLORIDE: 99 meq/L (ref 96–112)
CO2: 25 meq/L (ref 19–32)
CREATININE: 1.6 mg/dL — AB (ref 0.50–1.35)
Calcium: 9.7 mg/dL (ref 8.4–10.5)
GFR calc Af Amer: 49 mL/min — ABNORMAL LOW (ref >90)
GFR calc non Af Amer: 42 mL/min — ABNORMAL LOW (ref >90)
GLUCOSE: 149 mg/dL — AB (ref 70–99)
POTASSIUM: 3.8 meq/L (ref 3.7–5.3)
Phosphorus: 3.8 mg/dL (ref 2.3–4.6)
Sodium: 139 mEq/L (ref 137–147)

## 2013-12-22 LAB — CBC
HEMATOCRIT: 43 % (ref 39.0–52.0)
HEMOGLOBIN: 14.5 g/dL (ref 13.0–17.0)
MCH: 31.3 pg (ref 26.0–34.0)
MCHC: 33.7 g/dL (ref 30.0–36.0)
MCV: 92.7 fL (ref 78.0–100.0)
Platelets: 221 10*3/uL (ref 150–400)
RBC: 4.64 MIL/uL (ref 4.22–5.81)
RDW: 12.6 % (ref 11.5–15.5)
WBC: 9.9 10*3/uL (ref 4.0–10.5)

## 2013-12-22 LAB — GLUCOSE, CAPILLARY
GLUCOSE-CAPILLARY: 134 mg/dL — AB (ref 70–99)
GLUCOSE-CAPILLARY: 145 mg/dL — AB (ref 70–99)

## 2013-12-22 MED ORDER — AMIODARONE HCL 200 MG PO TABS: 400.0000 mg | ORAL_TABLET | Freq: Two times a day (BID) | ORAL | Status: DC

## 2013-12-22 NOTE — Discharge Summary (Signed)
Physician Discharge Summary  Matthew [name] BWL:[id number] DOB: 1945/07/26 DOA: 12/20/2013  PCP: Gavin Pound, MD  Admit date: 12/20/2013 Discharge date: 12/22/2013  Time spent: 40 minutes  Recommendations for Outpatient Follow-up:  1. Followup with primary care physician within one week. 2. Followup with Dr. Irish Lack in 2 weeks. 3. Followup with gastroenterology, to evaluate if it's okay to start Xarelto.  Discharge Diagnoses:  Principal Problem:   PAF (paroxysmal atrial fibrillation) Active Problems:   CAD (coronary artery disease)   Renal cell carcinoma   Chest pain   Chronic abdominal pain   CKD (chronic kidney disease), stage III   H/O: lung cancer   LV dysfunction   RBBB   LAFB (left anterior fascicular block)   AAA (abdominal aortic aneurysm)   Discharge Condition: Stable  Diet recommendation: Heart healthy diet  Filed Weights   12/20/13 0720 12/20/13 1105  Weight: 122.471 kg (270 lb) 119.5 kg (263 lb 7.2 oz)    History of present illness:  68 year old male with past medical history of CAD status post CABG x 4, history of renal cell carcinoma statu post right nephrectomy, history of paroxysmal atrial fibrillation, hypertension, dyslipidemia, diabetes, hypothyroidism, ulcerative colitis who presented to [facility] ED 12/20/2013 with ongoing left sided chest pain for past 1 week duration but worsening over past 24 hours hours, 8/10 in intensity, at rest and with exertion, not radiating, not relieved with analgesia at home. Chest pain was associated with shortness of breath. No leg swelling. No fevers or chills. No reports of cough or sputum production. No palpitations. No reports of abdominal pain, nausea or vomiting. No reports of lightheadedness or loss of consciousness.  In ED, BP was 110/60, HR 60, T max 98 F and oxygen saturation was 93% on room air. Blood work revealed creatinine of 1.61 otherwise unremarkable. The 12 lead EKG showed atrial fibrillation. The first troponin  was WNL.  Hospital Course:   Chest pain / history of CAD status post CABG  Admitted to rule out ACS. 3 sets of cardiac enzymes and 12 EKG negative for ischemia. BNP on admission 289.  Chest pain is likely secondary to RVR of the atrial fibrillation.  Atrial fibrillation, paroxysmal  Patient developed atrial fibrillation with rapid ventricular response earlier today.  Started initially on Cardizem drip, cardiology consulted.  Cardiology recommended amiodarone 400 mg twice a day till he sees his cardiologist. Recommended 81 mg of aspirin for now. Patient has CHADS2 score of 4, he will require anticoagulation, has history of ulcerative colitis/recurrent bleeding. Needs okay from gastroenterology prior starting anticoagulation (cardiology recommended Xarelto)  CKD, stage 3 GFR 31-60  Creatinine in 2012 1.7 and on this admission 1.61 so seems to be in baseline range  Diabetes, with renal manifestations  Check A1c  Restart home insulin regimen: Levemir 40 units BID and novolog 16 units TID  Glycemic order set in place   History of tinea  Continue home topical ointments   Hypothyroidism  Continue synthroid 100 mcg daily    Procedures:  Pulmonary function test, results pending.  2-D echocardiogram done on 12/21/2013, showed LVEF of 40-45% and hypokinesis of the apex, grade 1 DD  Consultations:  Cardiology  Discharge Exam: Filed Vitals:   12/22/13 0543  BP: 93/70  Pulse: 58  Temp: 97.3 F (36.3 C)  Resp: 16   General: Alert and awake, oriented x3, not in any acute distress. HEENT: anicteric sclera, pupils reactive to light and accommodation, EOMI CVS: S1-S2 clear, no murmur rubs or  gallops Chest: clear to auscultation bilaterally, no wheezing, rales or rhonchi Abdomen: soft nontender, nondistended, normal bowel sounds, no organomegaly Extremities: no cyanosis, clubbing or edema noted bilaterally Neuro: Cranial nerves II-XII intact, no focal neurological  deficits  Discharge Instructions You were cared for by a hospitalist during your hospital stay. If you have any questions about your discharge medications or the care you received while you were in the hospital after you are discharged, you can call the unit and asked to speak with the hospitalist on call if the hospitalist that took care of you is not available. Once you are discharged, your primary care physician will handle any further medical issues. Please note that NO REFILLS for any discharge medications will be authorized once you are discharged, as it is imperative that you return to your primary care physician (or establish a relationship with a primary care physician if you do not have one) for your aftercare needs so that they can reassess your need for medications and monitor your lab values.  Discharge Instructions   Diet - low sodium heart healthy    Complete by:  As directed      Increase activity slowly    Complete by:  As directed             Medication List         albuterol 108 (90 BASE) MCG/ACT inhaler  Commonly known as:  PROVENTIL HFA;VENTOLIN HFA  Inhale into the lungs every 6 (six) hours as needed for wheezing or shortness of breath.     amiodarone 200 MG tablet  Commonly known as:  PACERONE  Take 2 tablets (400 mg total) by mouth 2 (two) times daily.     aspirin 81 MG tablet  Take 81 mg by mouth daily.     buPROPion 150 MG 24 hr tablet  Commonly known as:  WELLBUTRIN XL  Take 150 mg by mouth every morning.     capsaicin 0.025 % cream  Commonly known as:  ZOSTRIX  Apply 1 application topically 2 (two) times daily.     cholecalciferol 1000 UNITS tablet  Commonly known as:  VITAMIN D  Take 3,000 Units by mouth daily.     clotrimazole 1 % cream  Commonly known as:  LOTRIMIN  Apply 1 application topically 2 (two) times daily.     fenofibrate 54 MG tablet  Take 54 mg by mouth daily.     ferrous sulfate dried 160 (50 FE) MG Tbcr SR tablet  Commonly  known as:  SLOW FE  Take 160 mg by mouth 2 (two) times daily with a meal.     gabapentin 300 MG capsule  Commonly known as:  NEURONTIN  Take 300 mg by mouth 2 times daily at 12 noon and 4 pm.     glucose 4 GM chewable tablet  Chew 1 tablet by mouth as needed for low blood sugar.     HUMALOG KWIKPEN 100 UNIT/ML KiwkPen  Generic drug:  insulin lispro  Inject 16 Units into the skin 3 (three) times daily. Take as directed     hydrocortisone-pramoxine 2.5-1 % rectal cream  Commonly known as:  ANALPRAM-HC  Place 1 application rectally 3 (three) times daily as needed for hemorrhoids or itching.     insulin detemir 100 UNIT/ML injection  Commonly known as:  LEVEMIR  Inject 46 Units into the skin 2 (two) times daily.     levothyroxine 100 MCG tablet  Commonly known as:  SYNTHROID, LEVOTHROID  Take 100 mcg by mouth daily before breakfast.     LIDOCAINE EX  Apply 1 application topically 2 (two) times daily as needed.     Magnesium Oxide 420 MG Tabs  Take 1 tablet by mouth daily.     mupirocin ointment 2 %  Commonly known as:  BACTROBAN  Place 1 application into the nose daily.     nitroGLYCERIN 0.4 MG SL tablet  Commonly known as:  NITROSTAT  Place 0.4 mg under the tongue every 5 (five) minutes as needed for chest pain.     nystatin cream  Commonly known as:  MYCOSTATIN  Apply 1 application topically 2 (two) times daily.     OMEGA 3 PO  Take 4,000 mg by mouth 2 (two) times daily.     omeprazole 40 MG capsule  Commonly known as:  PRILOSEC  Take 40 mg by mouth daily.     oxyCODONE-acetaminophen 10-325 MG per tablet  Commonly known as:  PERCOCET  Take 1 tablet by mouth every 4 (four) hours as needed for pain.     polyethylene glycol packet  Commonly known as:  MIRALAX / GLYCOLAX  Take 17 g by mouth daily.     psyllium 0.52 G capsule  Commonly known as:  REGULOID  Take 0.52 g by mouth daily.     tizanidine 2 MG capsule  Commonly known as:  ZANAFLEX  Take 2 mg by  mouth as needed for muscle spasms.     traZODone 50 MG tablet  Commonly known as:  DESYREL  Take 50 mg by mouth at bedtime.       Allergies  Allergen Reactions  . Atorvastatin Other (See Comments)    Leg muscle cramps  . Tetanus Toxoids Hives      The results of significant diagnostics from this hospitalization (including imaging, microbiology, ancillary and laboratory) are listed below for reference.    Significant Diagnostic Studies: Dg Chest 2 View  12/20/2013   CLINICAL DATA:  Chest pain and tightness  EXAM: CHEST  2 VIEW  COMPARISON:  04/02/2011  FINDINGS: Midline sternotomy. Normal mediastinum and cardiac silhouette. Normal pulmonary vasculature. No evidence of effusion, infiltrate, or pneumothorax. No acute bony abnormality.  IMPRESSION: No acute cardiopulmonary process.   Electronically Signed   By: Suzy Bouchard M.D.   On: 12/20/2013 09:16    Microbiology: Recent Results (from the past 240 hour(s))  MRSA PCR SCREENING     Status: Abnormal   Collection Time    12/21/13  4:49 PM      Result Value Ref Range Status   MRSA by PCR POSITIVE (*) NEGATIVE Final   Comment:            The GeneXpert MRSA Assay (FDA     approved for NASAL specimens     only), is one component of a     comprehensive MRSA colonization     surveillance program. It is not     intended to diagnose MRSA     infection nor to guide or     monitor treatment for     MRSA infections.     RESULT CALLED TO, READ BACK BY AND VERIFIED WITH:     MADRIATA,E. RN @ 1954 ON 12/21/13 BY MCCOY,N.     Labs: Basic Metabolic Panel:  Recent Labs Lab 12/20/13 0846 12/22/13 0440  NA 139 139  K 4.5 3.8  CL 101 99  CO2 22 25  GLUCOSE 138* 149*  BUN 28* 27*  CREATININE 1.61* 1.60*  CALCIUM 10.0 9.7  PHOS  --  3.8   Liver Function Tests:  Recent Labs Lab 12/18/13 0906 12/20/13 0846 12/22/13 0440  AST 19 22  --   ALT 23 22  --   ALKPHOS 28* 30*  --   BILITOT 0.3 0.3  --   PROT 7.5 7.6  --    ALBUMIN 3.8 3.5 3.4*   No results found for this basename: LIPASE, AMYLASE,  in the last 168 hours No results found for this basename: AMMONIA,  in the last 168 hours CBC:  Recent Labs Lab 12/20/13 0846 12/22/13 0440  WBC 8.0 9.9  HGB 15.3 14.5  HCT 45.0 43.0  MCV 92.8 92.7  PLT 229 221   Cardiac Enzymes:  Recent Labs Lab 12/20/13 0846 12/20/13 1154 12/20/13 1807 12/20/13 2354 12/21/13 0506  TROPONINI <0.30 <0.30 <0.30 <0.30 <0.30   BNP: BNP (last 3 results)  Recent Labs  12/20/13 0830  PROBNP 288.7*   CBG:  Recent Labs Lab 12/21/13 1157 12/21/13 1652 12/21/13 2144 12/22/13 0740 12/22/13 1151  GLUCAP 130* 99 117* 134* 145*       Signed:  Tangala Wiegert A  [facility] 12/22/2013, 12:08 PM

## 2013-12-22 NOTE — Progress Notes (Signed)
Stopped cardizem drip at this time.BP 93/70 HR 58. Patient denies pain/discomfort/SOB.  12/22/13 0543  Vitals  Temp 97.3 F (36.3 C)  Temp src Oral  BP 93/70 mmHg  BP Location Right arm  BP Method Automatic  Patient Position (if appropriate) Lying  Pulse Rate ! 58  Pulse Rate Source Dinamap  Resp 16  Oxygen Therapy  SpO2 99 %  O2 Device None (Room air)

## 2013-12-22 NOTE — Progress Notes (Signed)
Patient Name: Matthew [name] Date of Encounter: 12/22/2013  Principal Problem:   PAF (paroxysmal atrial fibrillation) Active Problems:   CAD (coronary artery disease)   Renal cell carcinoma   Chest pain   Chronic abdominal pain   CKD (chronic kidney disease), stage III   H/O: lung cancer   LV dysfunction   RBBB   LAFB (left anterior fascicular block)   AAA (abdominal aortic aneurysm)   Length of Stay: 2  SUBJECTIVE  In and out of AF, but episodes are very brief and the ventricular rate is well controlled. He feels great. Wants to go home  CURRENT MEDS . amiodarone  400 mg Oral BID  . aspirin  81 mg Oral Daily  . buPROPion  150 mg Oral q morning - 10a  . capsaicin  1 application Topical BID  . Chlorhexidine Gluconate Cloth  6 each Topical Q0600  . cholecalciferol  3,000 Units Oral Daily  . clotrimazole  1 application Topical BID  . enoxaparin (LOVENOX) injection  30 mg Subcutaneous Q24H  . fenofibrate  54 mg Oral Daily  . ferrous sulfate  325 mg Oral Q breakfast  . gabapentin  300 mg Oral q12n4p  . insulin aspart  0-15 Units Subcutaneous TID WC  . insulin aspart  16 Units Subcutaneous TID WC  . insulin detemir  46 Units Subcutaneous BID  . levothyroxine  100 mcg Oral QAC breakfast  . magnesium oxide  400 mg Oral Daily  . mupirocin cream   Topical BID  . nystatin cream  1 application Topical BID  . omega-3 acid ethyl esters  2 g Oral BID  . pantoprazole  40 mg Oral Daily  . polyethylene glycol  17 g Oral Daily  . psyllium  1 packet Oral Daily  . traZODone  50 mg Oral QHS    OBJECTIVE   Intake/Output Summary (Last 24 hours) at 12/22/13 0854 Last data filed at 12/22/13 0815  Gross per 24 hour  Intake 674.08 ml  Output   3400 ml  Net -2725.92 ml   Filed Weights   12/20/13 0720 12/20/13 1105  Weight: 270 lb (122.471 kg) 263 lb 7.2 oz (119.5 kg)    PHYSICAL EXAM Filed Vitals:   12/21/13 1948 12/21/13 2257 12/22/13 0044 12/22/13 0543  BP: 114/64 108/62  103/51 93/70  Pulse: 69  52 58  Temp: 98.1 F (36.7 C)  97.9 F (36.6 C) 97.3 F (36.3 C)  TempSrc: Oral  Oral Oral  Resp: 18  18 16   Height:      Weight:      SpO2: 100%  95% 99%   General: Alert, oriented x3, no distress Head: no evidence of trauma, PERRL, EOMI, no exophtalmos or lid lag, no myxedema, no xanthelasma; normal ears, nose and oropharynx Neck: normal jugular venous pulsations and no hepatojugular reflux; brisk carotid pulses without delay and no carotid bruits Chest: clear to auscultation, no signs of consolidation by percussion or palpation, normal fremitus, symmetrical and full respiratory excursions Cardiovascular: normal position and quality of the apical impulse, regular rhythm, normal first and second heart sounds, no rubs or gallops, no murmur Abdomen: no tenderness or distention, no masses by palpation, no abnormal pulsatility or arterial bruits, normal bowel sounds, no hepatosplenomegaly Extremities: no clubbing, cyanosis or edema; 2+ radial, ulnar and brachial pulses bilaterally; 2+ right femoral, posterior tibial and dorsalis pedis pulses; 2+ left femoral, posterior tibial and dorsalis pedis pulses; no subclavian or femoral bruits Neurological: grossly nonfocal  LABS  CBC  Recent Labs  12/20/13 0846 12/22/13 0440  WBC 8.0 9.9  HGB 15.3 14.5  HCT 45.0 43.0  MCV 92.8 92.7  PLT 229 599   Basic Metabolic Panel  Recent Labs  12/20/13 0846 12/22/13 0440  NA 139 139  K 4.5 3.8  CL 101 99  CO2 22 25  GLUCOSE 138* 149*  BUN 28* 27*  CREATININE 1.61* 1.60*  CALCIUM 10.0 9.7  PHOS  --  3.8   Liver Function Tests  Recent Labs  12/20/13 0846 12/22/13 0440  AST 22  --   ALT 22  --   ALKPHOS 30*  --   BILITOT 0.3  --   PROT 7.6  --   ALBUMIN 3.5 3.4*   No results found for this basename: LIPASE, AMYLASE,  in the last 72 hours Cardiac Enzymes  Recent Labs  12/20/13 1807 12/20/13 2354 12/21/13 0506  TROPONINI <0.30 <0.30 <0.30    BNP No components found with this basename: POCBNP,  D-Dimer No results found for this basename: DDIMER,  in the last 72 hours Hemoglobin A1C  Recent Labs  12/20/13 0846  HGBA1C 8.2*   Fasting Lipid Panel No results found for this basename: CHOL, HDL, LDLCALC, TRIG, CHOLHDL, LDLDIRECT,  in the last 72 hours Thyroid Function Tests No results found for this basename: TSH, T4TOTAL, FREET3, T3FREE, THYROIDAB,  in the last 72 hours  Radiology Studies Imaging results have been reviewed and Dg Chest 2 View  12/20/2013   CLINICAL DATA:  Chest pain and tightness  EXAM: CHEST  2 VIEW  COMPARISON:  04/02/2011  FINDINGS: Midline sternotomy. Normal mediastinum and cardiac silhouette. Normal pulmonary vasculature. No evidence of effusion, infiltrate, or pneumothorax. No acute bony abnormality.  IMPRESSION: No acute cardiopulmonary process.   Electronically Signed   By: Suzy Bouchard M.D.   On: 12/20/2013 09:16    TELE Brief episodes of AF (5-10 min) with ventricular rate 90-105  ECG AF, RBBB, LAFB  ASSESSMENT AND PLAN OK for DC home from cardiac point of view. After GI worup is complete, he needs to meet with Dr. Irish Lack to discuss starting anticoagulation - briefly discussed warfarin vs. NOAC today. He clearly prefers NOAC. Despite mild renal dysfunction, Xarelto (once daily) may be the best choice since "he is absent minded". ASA for now. Amiodarone 400 mg BID for 2 weeks, then decrease dose (this can be done at his appt with Dr. Irish Lack). Needs baseline outpatient PFTs.   Matthew Klein, MD, [facility] [phone] office [phone] pager 12/22/2013 8:54 AM

## 2013-12-23 DIAGNOSIS — M47817 Spondylosis without myelopathy or radiculopathy, lumbosacral region: Secondary | ICD-10-CM | POA: Diagnosis not present

## 2013-12-23 DIAGNOSIS — E1149 Type 2 diabetes mellitus with other diabetic neurological complication: Secondary | ICD-10-CM | POA: Diagnosis not present

## 2013-12-23 DIAGNOSIS — G894 Chronic pain syndrome: Secondary | ICD-10-CM | POA: Diagnosis not present

## 2013-12-23 DIAGNOSIS — M48062 Spinal stenosis, lumbar region with neurogenic claudication: Secondary | ICD-10-CM | POA: Diagnosis not present

## 2013-12-23 NOTE — ED Provider Notes (Signed)
I saw and evaluated the patient, reviewed the resident's note and I agree with the findings and plan.   EKG Interpretation   Date/Time:  Sunday December 20 2013 07:17:51 EDT Ventricular Rate:  129 PR Interval:    QRS Duration: 129 QT Interval:  341 QTC Calculation: 500 R Axis:   -83 Text Interpretation:  Atrial fibrillation Right bundle branch block  Nonspecific ST abnormality Confirmed by Peggy Monk  MD, Lennette Bihari ([id number]) on  12/20/2013 7:54:58 AM      Pt w hx cad, cabg/stents, c/o mid chest pain, sob.   While in ED w period afib/hr 130s.  No syncope.   Continuous pulse ox and monitor. Labs. Cxr.  Multiple rechecks of pt.  Recheck pt in sinus rhythm, rate 90s.   Results for orders placed during the hospital encounter of 12/20/13  MRSA PCR SCREENING      Result Value Ref Range   MRSA by PCR POSITIVE (*) NEGATIVE  CBC      Result Value Ref Range   WBC 8.0  4.0 - 10.5 K/uL   RBC 4.85  4.22 - 5.81 MIL/uL   Hemoglobin 15.3  13.0 - 17.0 g/dL   HCT 45.0  39.0 - 52.0 %   MCV 92.8  78.0 - 100.0 fL   MCH 31.5  26.0 - 34.0 pg   MCHC 34.0  30.0 - 36.0 g/dL   RDW 12.4  11.5 - 15.5 %   Platelets 229  150 - 400 K/uL  COMPREHENSIVE METABOLIC PANEL      Result Value Ref Range   Sodium 139  137 - 147 mEq/L   Potassium 4.5  3.7 - 5.3 mEq/L   Chloride 101  96 - 112 mEq/L   CO2 22  19 - 32 mEq/L   Glucose, Bld 138 (*) 70 - 99 mg/dL   BUN 28 (*) 6 - 23 mg/dL   Creatinine, Ser 1.61 (*) 0.50 - 1.35 mg/dL   Calcium 10.0  8.4 - 10.5 mg/dL   Total Protein 7.6  6.0 - 8.3 g/dL   Albumin 3.5  3.5 - 5.2 g/dL   AST 22  0 - 37 U/L   ALT 22  0 - 53 U/L   Alkaline Phosphatase 30 (*) 39 - 117 U/L   Total Bilirubin 0.3  0.3 - 1.2 mg/dL   GFR calc non Af Amer 42 (*) >90 mL/min   GFR calc Af Amer 49 (*) >90 mL/min   Anion gap 16 (*) 5 - 15  PRO B NATRIURETIC PEPTIDE      Result Value Ref Range   Pro B Natriuretic peptide (BNP) 288.7 (*) 0 - 125 pg/mL  URINE RAPID DRUG SCREEN (HOSP PERFORMED)      Result  Value Ref Range   Opiates NONE DETECTED  NONE DETECTED   Cocaine NONE DETECTED  NONE DETECTED   Benzodiazepines NONE DETECTED  NONE DETECTED   Amphetamines NONE DETECTED  NONE DETECTED   Tetrahydrocannabinol NONE DETECTED  NONE DETECTED   Barbiturates NONE DETECTED  NONE DETECTED  TROPONIN I      Result Value Ref Range   Troponin I <0.30  <0.30 ng/mL  TROPONIN I      Result Value Ref Range   Troponin I <0.30  <0.30 ng/mL  TROPONIN I      Result Value Ref Range   Troponin I <0.30  <0.30 ng/mL  GLUCOSE, CAPILLARY      Result Value Ref Range   Glucose-Capillary 227 (*) 70 -  99 mg/dL   Comment 1 Notify RN     Comment 2 Documented in Chart    HEMOGLOBIN A1C      Result Value Ref Range   Hemoglobin A1C 8.2 (*) <5.7 %   Mean Plasma Glucose 189 (*) <117 mg/dL  GLUCOSE, CAPILLARY      Result Value Ref Range   Glucose-Capillary 331 (*) 70 - 99 mg/dL  TROPONIN I      Result Value Ref Range   Troponin I <0.30  <0.30 ng/mL  TROPONIN I      Result Value Ref Range   Troponin I <0.30  <0.30 ng/mL  GLUCOSE, CAPILLARY      Result Value Ref Range   Glucose-Capillary 196 (*) 70 - 99 mg/dL  GLUCOSE, CAPILLARY      Result Value Ref Range   Glucose-Capillary 192 (*) 70 - 99 mg/dL  GLUCOSE, CAPILLARY      Result Value Ref Range   Glucose-Capillary 130 (*) 70 - 99 mg/dL  RENAL FUNCTION PANEL      Result Value Ref Range   Sodium 139  137 - 147 mEq/L   Potassium 3.8  3.7 - 5.3 mEq/L   Chloride 99  96 - 112 mEq/L   CO2 25  19 - 32 mEq/L   Glucose, Bld 149 (*) 70 - 99 mg/dL   BUN 27 (*) 6 - 23 mg/dL   Creatinine, Ser 1.60 (*) 0.50 - 1.35 mg/dL   Calcium 9.7  8.4 - 10.5 mg/dL   Phosphorus 3.8  2.3 - 4.6 mg/dL   Albumin 3.4 (*) 3.5 - 5.2 g/dL   GFR calc non Af Amer 42 (*) >90 mL/min   GFR calc Af Amer 49 (*) >90 mL/min   Anion gap 15  5 - 15  CBC      Result Value Ref Range   WBC 9.9  4.0 - 10.5 K/uL   RBC 4.64  4.22 - 5.81 MIL/uL   Hemoglobin 14.5  13.0 - 17.0 g/dL   HCT 43.0  39.0 -  52.0 %   MCV 92.7  78.0 - 100.0 fL   MCH 31.3  26.0 - 34.0 pg   MCHC 33.7  30.0 - 36.0 g/dL   RDW 12.6  11.5 - 15.5 %   Platelets 221  150 - 400 K/uL  GLUCOSE, CAPILLARY      Result Value Ref Range   Glucose-Capillary 99  70 - 99 mg/dL  GLUCOSE, CAPILLARY      Result Value Ref Range   Glucose-Capillary 117 (*) 70 - 99 mg/dL  GLUCOSE, CAPILLARY      Result Value Ref Range   Glucose-Capillary 134 (*) 70 - 99 mg/dL  GLUCOSE, CAPILLARY      Result Value Ref Range   Glucose-Capillary 145 (*) 70 - 99 mg/dL  PULMONARY FUNCTION TEST      Result Value Ref Range   FVC-Pre 3.80     FVC-%Pred-Pre 91     FVC-Post 3.82     FVC-%Pred-Post 92     FVC-%Change-Post 0     FEV1-Pre 2.84     FEV1-%Pred-Pre 90     FEV1-Post 2.83     FEV1-%Pred-Post 90     FEV1-%Change-Post 0     FEV6-Pre 3.80     FEV6-%Pred-Pre 95     FEV6-Post 3.78     FEV6-%Pred-Post 95     FEV6-%Change-Post 0     Pre FEV1/FVC ratio 75     FEV1FVC-%Pred-Pre 98  Post FEV1/FVC ratio 74     FEV1FVC-%Change-Post 0     Pre FEV6/FVC Ratio 100     FEV6FVC-%Pred-Pre 104     Post FEV6/FVC ratio 99     FEV6FVC-%Pred-Post 103     FEV6FVC-%Change-Post -1     FEF 25-75 Pre 2.38     FEF2575-%Pred-Pre 88     FEF 25-75 Post 2.38     FEF2575-%Pred-Post 88     FEF2575-%Change-Post 0     RV 3.86     RV % pred 150     TLC 8.34     TLC % pred 112     DLCO unc 28.31     DLCO unc % pred 80     DL/VA 4.53     DL/VA % pred 96     Dg Chest 2 View  12/20/2013   CLINICAL DATA:  Chest pain and tightness  EXAM: CHEST  2 VIEW  COMPARISON:  04/02/2011  FINDINGS: Midline sternotomy. Normal mediastinum and cardiac silhouette. Normal pulmonary vasculature. No evidence of effusion, infiltrate, or pneumothorax. No acute bony abnormality.  IMPRESSION: No acute cardiopulmonary process.   Electronically Signed   By: Suzy Bouchard M.D.   On: 12/20/2013 09:16      Mirna Mires, MD 12/23/13 [phone]

## 2013-12-24 ENCOUNTER — Telehealth: Payer: Self-pay | Admitting: Interventional Cardiology

## 2013-12-24 NOTE — Telephone Encounter (Signed)
Spoke with pts wife and pt hasnt been feeling well since he got out of the hospital. Pt takes amiodarone 400 mg BID. Pt vomitted today after leaving the [place]. Pt states after he takes the medication he will get a HA and start to feel sick on his stomach. He has only vomitted once, but he just doesn't feel good. Pts Bp yesterday spiked to 150/90's, but it usually runs 120's/80's or lower.

## 2013-12-24 NOTE — Telephone Encounter (Signed)
New Message  Pt wife called reports the pt has been vomiting since he has been out of the hospital. She believes that it is due to the medication Amiodarone and that it maybe to strong. She requests a call back to discuss possible medication changes//SR

## 2013-12-25 ENCOUNTER — Ambulatory Visit (INDEPENDENT_AMBULATORY_CARE_PROVIDER_SITE_OTHER)
Admission: RE | Admit: 2013-12-25 | Discharge: 2013-12-25 | Disposition: A | Discharge status: Home / Self Care | Payer: 59 | Source: Ambulatory Visit | Attending: Internal Medicine | Admitting: Internal Medicine

## 2013-12-25 DIAGNOSIS — Z860100 Personal history of colon polyps, unspecified: Secondary | ICD-10-CM

## 2013-12-25 DIAGNOSIS — I714 Abdominal aortic aneurysm, without rupture, unspecified: Secondary | ICD-10-CM | POA: Diagnosis not present

## 2013-12-25 MED ORDER — IOHEXOL 300 MG/ML  SOLN
100.0000 mL | Freq: Once | INTRAMUSCULAR | Status: AC | PRN
  Administered 2013-12-25: 100 mL via INTRAVENOUS

## 2013-12-25 NOTE — Telephone Encounter (Signed)
Spoke to wife.  He has switched to the once a day.

## 2013-12-25 NOTE — Telephone Encounter (Signed)
Decrease Amiodarone to 400 mg daily.

## 2013-12-27 LAB — PULMONARY FUNCTION TEST
DL/VA % pred: 96 %
DL/VA: 4.53 ml/min/mmHg/L
DLCO unc % pred: 80 %
DLCO unc: 28.31 ml/min/mmHg
FEF 25-75 Post: 2.38 L/sec
FEF 25-75 Pre: 2.38 L/sec
FEF2575-%CHANGE-POST: 0 %
FEF2575-%PRED-POST: 88 %
FEF2575-%Pred-Pre: 88 %
FEV1-%CHANGE-POST: 0 %
FEV1-%PRED-PRE: 90 %
FEV1-%Pred-Post: 90 %
FEV1-Post: 2.83 L
FEV1-Pre: 2.84 L
FEV1FVC-%Change-Post: 0 %
FEV1FVC-%PRED-PRE: 98 %
FEV6-%Change-Post: 0 %
FEV6-%PRED-POST: 95 %
FEV6-%Pred-Pre: 95 %
FEV6-PRE: 3.8 L
FEV6-Post: 3.78 L
FEV6FVC-%Change-Post: -1 %
FEV6FVC-%PRED-PRE: 104 %
FEV6FVC-%Pred-Post: 103 %
FVC-%Change-Post: 0 %
FVC-%PRED-PRE: 91 %
FVC-%Pred-Post: 92 %
FVC-POST: 3.82 L
FVC-Pre: 3.8 L
POST FEV1/FVC RATIO: 74 %
PRE FEV6/FVC RATIO: 100 %
Post FEV6/FVC ratio: 99 %
Pre FEV1/FVC ratio: 75 %
RV % PRED: 150 %
RV: 3.86 L
TLC % PRED: 112 %
TLC: 8.34 L

## 2013-12-28 MED ORDER — AMIODARONE HCL 200 MG PO TABS: 400.0000 mg | ORAL_TABLET | Freq: Every day | ORAL | Status: DC

## 2013-12-28 NOTE — Telephone Encounter (Signed)
Noted. Meds updated.

## 2013-12-29 DIAGNOSIS — E1059 Type 1 diabetes mellitus with other circulatory complications: Secondary | ICD-10-CM | POA: Diagnosis not present

## 2013-12-29 DIAGNOSIS — L608 Other nail disorders: Secondary | ICD-10-CM | POA: Diagnosis not present

## 2013-12-29 DIAGNOSIS — L84 Corns and callosities: Secondary | ICD-10-CM | POA: Diagnosis not present

## 2013-12-29 DIAGNOSIS — I4891 Unspecified atrial fibrillation: Secondary | ICD-10-CM | POA: Diagnosis not present

## 2013-12-29 DIAGNOSIS — I739 Peripheral vascular disease, unspecified: Secondary | ICD-10-CM | POA: Diagnosis not present

## 2013-12-30 NOTE — Telephone Encounter (Signed)
I have provided additional information for patient's insurance company. Per Rondel Baton @ [employer], patient's pantoprazole has been approved x 1 year. PA# is [id number]. Patient has tried prilosec and nexium in the past for GERD.

## 2014-01-01 ENCOUNTER — Telehealth: Payer: Self-pay | Admitting: Cardiology

## 2014-01-01 DIAGNOSIS — E782 Mixed hyperlipidemia: Secondary | ICD-10-CM

## 2014-01-01 MED ORDER — OMEGA-3-ACID ETHYL ESTERS 1 G PO CAPS: 2.0000 g | ORAL_CAPSULE | Freq: Two times a day (BID) | ORAL | Status: DC

## 2014-01-01 NOTE — Telephone Encounter (Signed)
Spoke with pts wife and she confirmed pt is taking lipitor 40 mg qd. Pt will stop current fish oil and start lovaza 4 grams daily. Labs scheduled.

## 2014-01-01 NOTE — Telephone Encounter (Addendum)
Received fax from [facility] stating Lovaza was denied for coverage. Per Matthew [name] pt will need to be on Fish Oil 6,000 mg daily. He can still get fish oil from the [place] (4,000 mg), but he will also need an extra 2,000 mg OTC.

## 2014-01-01 NOTE — Telephone Encounter (Signed)
Message copied by Alcario Drought on Fri Jan 01, 2014  1:49 PM ------      Message from: SMART, Maralyn Sago      Created: Wed Dec 30, 2013  8:32 AM       His recent hospital discharge note didn't have atorvastatin on the discharge list, but did have it under patient allergy.  If patient is indeed taking this, he should continue atorvastatin 40 mg qd and add Lovaza 4 g/d.  Please confirm they have the atorvastatin 40 mg bottle though, as his cholesterol is high enough where it appears he may not be taking it.  Would want to recheck lipid / liver panel in 8 weeks if he is taking atorvastatin 40 mg and can add Lovaza 4 g/d. ------

## 2014-01-07 ENCOUNTER — Encounter: Payer: Self-pay | Admitting: Interventional Cardiology

## 2014-01-07 ENCOUNTER — Ambulatory Visit (INDEPENDENT_AMBULATORY_CARE_PROVIDER_SITE_OTHER): Payer: 59 | Admitting: Interventional Cardiology

## 2014-01-07 VITALS — BP 102/64 | HR 58 | Ht 72.0 in | Wt 266.0 lb

## 2014-01-07 DIAGNOSIS — I2510 Atherosclerotic heart disease of native coronary artery without angina pectoris: Secondary | ICD-10-CM

## 2014-01-07 DIAGNOSIS — I480 Paroxysmal atrial fibrillation: Secondary | ICD-10-CM

## 2014-01-07 DIAGNOSIS — M545 Low back pain, unspecified: Secondary | ICD-10-CM | POA: Diagnosis not present

## 2014-01-07 DIAGNOSIS — I4891 Unspecified atrial fibrillation: Secondary | ICD-10-CM

## 2014-01-07 DIAGNOSIS — E782 Mixed hyperlipidemia: Secondary | ICD-10-CM

## 2014-01-07 MED ORDER — AMIODARONE HCL 200 MG PO TABS: 400.0000 mg | ORAL_TABLET | Freq: Every day | ORAL | Status: DC

## 2014-01-07 NOTE — Progress Notes (Signed)
Patient ID: Matthew [name], male   DOB: 1946-01-31, 68 y.o.   MRN: [id number]    Matthew [name], Matthew [name], Matthew [name]  [id number] Phone: [phone] Fax:  [phone]  Date:  01/07/2014   ID:  Matthew [name], Matthew [name] 06-28-1945, MRN [id number]  PCP:  Gavin Pound, MD      History of Present Illness: Matthew [name] is a 68 y.o. male who had CABG. he was hospitalized a few years ago for a pneumonia. He was also found to have an empyema at that time. He required a chest tube and was quite ill. He had lost a lot of weight at that time. He has gained back much of the weight that he lost. He has some shortness of breath with walking up stairs, but it is not as bad as it was before his bypass surgery. He thinks now, it is because he has not been walking regularly and because of the weight gain. No sx like he had prior to CABG.  Avoiding back surgery. Instead getting injections. Left shoulder pain improved. Affected by moving arm a certain way. CAD/ASCVD:  Denies : Chest pain.  Dizziness.  Leg edema.   Orthopnea.  Palpitations.  Syncope.   Used NTG when he had AFib.  Hospitalized with AFib and sent out on Amiodarone.  Nausea better on lower dose of amiodarone, 200 mg BID  Wt Readings from Last 3 Encounters:  01/07/14 266 lb (120.657 kg)  12/20/13 263 lb 7.2 oz (119.5 kg)  12/15/13 270 lb (122.471 kg)     Past Medical History  Diagnosis Date  . Diabetes mellitus   . Hypertension   . Empyema lung     a. PNA 02/2011 c/b empyema requiring chest tube, drainage of pleural effusion and decortication.  . Nodule of left lung     a. Cavitary nodules 02/2011, f/u recommended.  . CKD (chronic kidney disease), stage III   . CAD (coronary artery disease)     a. s/p CABGx4 05/2009.  Marland Kitchen Renal cell carcinoma     a. s/p R nephrectomy 2002. Reported pulmonary mets per note at that time.  . Anemia   . Cocaine abuse in remission   . Paroxysmal supraventricular tachycardia     a. While  inpatient 02/2011 sick with empyema - wide appearace due to RBBB.  Marland Kitchen Hypothyroidism   . Hyperlipidemia   . Iron deficiency anemia   . Vitamin D deficiency   . Scrotal abscess     a. 2012 2/2 Streptococcus group C, status post incision and drainage.  . Enlarged prostate   . Atrial fibrillation     a. Post-op CABG 05/2009. b. Re-identified 12/2013.  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  . Colon polyps   . Chronic constipation   . Obesity   . AAA (abdominal aortic aneurysm)     a. Aortic US 05/2012: 3.9 cm fusiform abdominal aortic aneurysm. F/u recommended 05/2014.  Marland Kitchen RBBB   . LAFB (left anterior fascicular block)   . Lung cancer     a. Tx with interleukin in 2002 per patient.    Current Outpatient Prescriptions  Medication Sig Dispense Refill  . albuterol (PROVENTIL HFA;VENTOLIN HFA) 108 (90 BASE) MCG/ACT inhaler Inhale into the lungs every 6 (six) hours as needed for wheezing or shortness of breath.      Marland Kitchen amiodarone (PACERONE) 200 MG tablet Take 2 tablets (400 mg total) by mouth  daily.  60 tablet  0  . aspirin 81 MG tablet Take 81 mg by mouth daily.      Marland Kitchen buPROPion (WELLBUTRIN XL) 150 MG 24 hr tablet Take 150 mg by mouth every morning.      . capsaicin (ZOSTRIX) 0.025 % cream Apply 1 application topically 2 (two) times daily.      . cholecalciferol (VITAMIN D) 1000 UNITS tablet Take 4,000 Units by mouth daily.       . clotrimazole (LOTRIMIN) 1 % cream Apply 1 application topically 2 (two) times daily.      . fenofibrate 54 MG tablet Take 54 mg by mouth daily.      . ferrous sulfate dried (SLOW FE) 160 (50 FE) MG TBCR Take 160 mg by mouth 2 (two) times daily with a meal.        . gabapentin (NEURONTIN) 300 MG capsule Take 300 mg by mouth 2 times daily at 12 noon and 4 pm.        . glucose 4 GM chewable tablet Chew 1 tablet by mouth as needed for low blood sugar.      . hydrocortisone-pramoxine (ANALPRAM-HC) 2.5-1 % rectal cream Place 1 application rectally 3  (three) times daily as needed for hemorrhoids or itching.       . insulin detemir (LEVEMIR) 100 UNIT/ML injection Inject 46 Units into the skin 2 (two) times daily.       . insulin lispro (HUMALOG KWIKPEN) 100 UNIT/ML KiwkPen Inject 16 Units into the skin 3 (three) times daily. Take as directed      . levothyroxine (SYNTHROID, LEVOTHROID) 100 MCG tablet Take 100 mcg by mouth daily before breakfast.       . LIDOCAINE EX Apply 1 application topically 2 (two) times daily as needed.      . Magnesium Oxide 420 MG TABS Take 1 tablet by mouth daily.      . mupirocin ointment (BACTROBAN) 2 % Place 1 application into the nose daily.      . nitroGLYCERIN (NITROSTAT) 0.4 MG SL tablet Place 0.4 mg under the tongue every 5 (five) minutes as needed for chest pain.       . NON FORMULARY theragesics for pain      . nystatin cream (MYCOSTATIN) Apply 1 application topically 2 (two) times daily.      Marland Kitchen omega-3 acid ethyl esters (LOVAZA) 1 G capsule Take 2 capsules (2 g total) by mouth 2 (two) times daily.  120 capsule  6  . omeprazole (PRILOSEC) 40 MG capsule Take 40 mg by mouth daily.       Marland Kitchen oxyCODONE-acetaminophen (PERCOCET) 10-325 MG per tablet Take 1 tablet by mouth every 4 (four) hours as needed for pain.      . polyethylene glycol (MIRALAX / GLYCOLAX) packet Take 17 g by mouth daily.      . psyllium (REGULOID) 0.52 G capsule Take 0.52 g by mouth daily.      . tizanidine (ZANAFLEX) 2 MG capsule Take 2 mg by mouth as needed for muscle spasms.      . traZODone (DESYREL) 50 MG tablet Take 50 mg by mouth at bedtime.       No current facility-administered medications for this visit.    Allergies:    Allergies  Allergen Reactions  . Atorvastatin Other (See Comments)    Leg muscle cramps  . Tetanus Toxoids Hives    Social History:  The patient  reports that he has never smoked. He has  never used smokeless tobacco. He reports that he does not drink alcohol or use illicit drugs.   Family History:  The  patient's family history includes Alzheimer's disease in his mother; Heart disease in his father; Pneumonia in an other family member.   ROS:  Please see the history of present illness.  No nausea, vomiting.  No fevers, chills.  No focal weakness.  No dysuria. Weight gain.  All other systems reviewed and negative.   PHYSICAL EXAM: VS:  BP 102/64  Pulse 58  Ht 6' (1.829 m)  Wt 266 lb (120.657 kg)  BMI 36.07 kg/m2 Well nourished, well developed, in no acute distress HEENT: normal Neck: no JVD, no carotid bruits Cardiac:  normal S1, S2; RRR;  Lungs:  clear to auscultation bilaterally, no wheezing, rhonchi or rales Abd: soft, nontender, no hepatomegaly Ext: no edema; varicose veins- L>R Skin: warm and dry Neuro:   no focal abnormalities noted  EKG:  NSR, RBBB, LAFB    ASSESSMENT AND PLAN:   Shortness of breath /AFib: LAB: BNP-80 in 5/14.  Stress test in 5/14 showed small apical defect.  Managed medically.  EF 40-45%. Stable.  Increase exercise.  COntinue Amiodarone 400 mg daily.  Continue anticoagulation as well.  Will check with Dr. Hilarie Fredrickson to see if Xarelto would be ok.   2. Coronary atherosclerosis of native coronary artery  Notes: Stable.  Rarely used NTG.   3. Mixed hyperlipidemia   Continue Atorvastatin Calcium Tablet, 40 MG, 1 tablet, Orally, Once a day, 30 day(s), 30, Refills 11 ; Awaiting to see if Lovaza will be approved.  Lipid clinic.  Notes: Lipids increased. LDL 182. Target < 100. Changed to atorvastatin. Lipid clinic.  TG was high so LDL could not be calculated in 12/14.   PharmD enrolled patient in our lipid clinic.    Procedures  Venipuncture:  Venipuncture: Hall,Jechelle 10/21/2012 04:03:05 PM > , performed in right arm.      4.  Obesity:  He has gained weight.  I would like him to try to lose 15 lbs in the next 6 months.  Labs    Lab: BNP 5/14  B-NATRIURETIC PEPTIDE 80  0-100 - pg/mL      5.  Back pain: this limits is walking.  He wants to seee Dr. Trenton Gammon  to see if there is something that can be done for his back. Limited by UC, now seeing Dr. Hilarie Fredrickson.  Signed, Mina Marble, MD, [facility] 01/07/2014 3:26 PM

## 2014-01-07 NOTE — Patient Instructions (Addendum)
You have been referred to Neurosurgery to see Dr. Earnie Larsson.  Your physician recommends that you continue on your current medications as directed. Please refer to the Current Medication list given to you today.  Your physician recommends that you schedule a follow-up appointment in: 4 months with Dr. Irish Lack.

## 2014-01-13 NOTE — Telephone Encounter (Signed)
Pts wife notified. Pt will increase fish oil to 6,000 mg daily.

## 2014-01-13 NOTE — Telephone Encounter (Signed)
lmtrc

## 2014-01-13 NOTE — Addendum Note (Signed)
Addended byUlla Potash H on: 01/13/2014 09:52 AM   Modules accepted: Orders

## 2014-01-14 ENCOUNTER — Telehealth: Payer: Self-pay | Admitting: Interventional Cardiology

## 2014-01-14 NOTE — Telephone Encounter (Signed)
01-14-14  Received fax from [facility] stating that Dr. Annette Stable declined to see this patient, no reason given.

## 2014-01-15 ENCOUNTER — Telehealth: Payer: Self-pay | Admitting: Interventional Cardiology

## 2014-01-15 MED ORDER — AMIODARONE HCL 200 MG PO TABS: 400.0000 mg | ORAL_TABLET | Freq: Every day | ORAL | Status: DC

## 2014-01-15 NOTE — Telephone Encounter (Signed)
Refilled

## 2014-01-15 NOTE — Telephone Encounter (Signed)
New problem   Pt need new prescription for Amiodarone hcl 278m tablets call to [facility]

## 2014-01-26 ENCOUNTER — Telehealth: Payer: Self-pay | Admitting: Interventional Cardiology

## 2014-01-26 NOTE — Telephone Encounter (Signed)
New message           Pt wife calling to see if you can give her copy of pt echocardiogram or any tests you have done for pt / these records will be sent to the [place] / pt will come by to pick these up

## 2014-01-26 NOTE — Telephone Encounter (Signed)
To [employer].

## 2014-01-26 NOTE — Telephone Encounter (Signed)
Spoke with Pt, he will come and pick up copy of Echo Report and made Him aware of Release that needs to be signed.

## 2014-01-27 ENCOUNTER — Ambulatory Visit (AMBULATORY_SURGERY_CENTER): Payer: 59 | Admitting: Internal Medicine

## 2014-01-27 ENCOUNTER — Encounter: Payer: Self-pay | Admitting: Internal Medicine

## 2014-01-27 VITALS — BP 107/69 | HR 52 | Temp 96.4°F | Resp 18 | Ht 72.0 in | Wt 266.0 lb

## 2014-01-27 DIAGNOSIS — K625 Hemorrhage of anus and rectum: Secondary | ICD-10-CM | POA: Diagnosis not present

## 2014-01-27 DIAGNOSIS — D126 Benign neoplasm of colon, unspecified: Secondary | ICD-10-CM | POA: Diagnosis not present

## 2014-01-27 DIAGNOSIS — K5900 Constipation, unspecified: Secondary | ICD-10-CM | POA: Diagnosis not present

## 2014-01-27 DIAGNOSIS — R109 Unspecified abdominal pain: Secondary | ICD-10-CM | POA: Diagnosis not present

## 2014-01-27 DIAGNOSIS — Z86010 Personal history of colonic polyps: Secondary | ICD-10-CM

## 2014-01-27 LAB — GLUCOSE, CAPILLARY
Glucose-Capillary: 242 mg/dL — ABNORMAL HIGH (ref 70–99)
Glucose-Capillary: 252 mg/dL — ABNORMAL HIGH (ref 70–99)

## 2014-01-27 MED ORDER — SODIUM CHLORIDE 0.9 % IV SOLN: 500.0000 mL | INTRAVENOUS | Status: DC

## 2014-01-27 NOTE — Op Note (Addendum)
[facility]  [address] [place], [id number]   COLONOSCOPY PROCEDURE REPORT  PATIENT: Matthew [name], Matthew [name]  MR#: [id number] BIRTHDATE: 27-Jul-1945 , 68  yrs. old GENDER: Male ENDOSCOPIST: Jerene Bears, MD PROCEDURE DATE:  01/27/2014 PROCEDURE:   Colonoscopy with snare polypectomy First Screening Colonoscopy - Avg.  risk and is 50 yrs.  old or older - No.  Prior Negative Screening - Now for repeat screening. N/A  History of Adenoma - Now for follow-up colonoscopy & has been > or = to 3 yrs.  N/A  Polyps Removed Today? Yes. ASA CLASS:   Class III INDICATIONS:Rectal Bleeding, Abdominal pain, and Constipation. Remote history of ulcerative colitis/proctitis MEDICATIONS: MAC sedation, administered by CRNA and propofol (Diprivan) 143m IV  DESCRIPTION OF PROCEDURE:   After the risks benefits and alternatives of the procedure were thoroughly explained, informed consent was obtained.  A digital rectal exam revealed no rectal mass and small external hemorrhoid.   The LB [id number] was introduced through the anus and advanced to the cecum, which was identified by both the appendix and ileocecal valve. No adverse events experienced.   The quality of the prep was good, using MoviPrep  The instrument was then slowly withdrawn as the colon was fully examined.  COLON FINDINGS: Five sessile polyps ranging between 3-5740min size were found in the ascending colon, transverse colon, sigmoid colon, and rectosigmoid colon.  Polypectomy was performed using cold snare.  All resections were complete and all polyp tissue was completely retrieved.   The colon was otherwise normal.  There was no inflammation or evidence for IBD.  Retroflexed views revealed no abnormalities. The time to cecum=1 minutes 59 seconds.  Withdrawal time=12 minutes 24 seconds.  The scope was withdrawn and the procedure completed. COMPLICATIONS: There were no complications.  ENDOSCOPIC  IMPRESSION: 1.   Five sessile polyps ranging between 3-40m78mn size were found in the ascending colon, transverse colon, sigmoid colon, and rectosigmoid colon; Polypectomy was performed using cold snare 2.   The colon was otherwise normal  RECOMMENDATIONS: 1.  Await pathology results 2.  Continue current medicines to prevent constipation 3.  Timing of repeat colonoscopy will be determined by pathology findings. 4.  You will receive a letter within 1-2 weeks with the results of your biopsy as well as final recommendations.  Please call my office if you have not received a letter after 3 weeks.   eSigned:  JayJerene BearsD 01/27/2014 2:41 PM Revised: 01/27/2014 2:41 PM cc: The Patient; AdaGavin PoundD

## 2014-01-27 NOTE — Progress Notes (Signed)
Called to room to assist during endoscopic procedure.  Patient ID and intended procedure confirmed with present staff. Received instructions for my participation in the procedure from the performing physician.  

## 2014-01-27 NOTE — Patient Instructions (Signed)
Colon polyps removed today, handout given. Resume current medications.  Call us with any questions or concerns. Thank you!!  YOU HAD AN ENDOSCOPIC PROCEDURE TODAY AT [facility]: Refer to the procedure report that was given to you for any specific questions about what was found during the examination.  If the procedure report does not answer your questions, please call your gastroenterologist to clarify.  If you requested that your care partner not be given the details of your procedure findings, then the procedure report has been included in a sealed envelope for you to review at your convenience later.  YOU SHOULD EXPECT: Some feelings of bloating in the abdomen. Passage of more gas than usual.  Walking can help get rid of the air that was put into your GI tract during the procedure and reduce the bloating. If you had a lower endoscopy (such as a colonoscopy or flexible sigmoidoscopy) you may notice spotting of blood in your stool or on the toilet paper. If you underwent a bowel prep for your procedure, then you may not have a normal bowel movement for a few days.  DIET: Your first meal following the procedure should be a light meal and then it is ok to progress to your normal diet.  A half-sandwich or bowl of soup is an example of a good first meal.  Heavy or fried foods are harder to digest and may make you feel nauseous or bloated.  Likewise meals heavy in dairy and vegetables can cause extra gas to form and this can also increase the bloating.  Drink plenty of fluids but you should avoid alcoholic beverages for 24 hours.  ACTIVITY: Your care partner should take you home directly after the procedure.  You should plan to take it easy, moving slowly for the rest of the day.  You can resume normal activity the day after the procedure however you should NOT DRIVE or use heavy machinery for 24 hours (because of the sedation medicines used during the test).    SYMPTOMS TO REPORT  IMMEDIATELY: A gastroenterologist can be reached at any hour.  During normal business hours, 8:30 AM to 5:00 PM Monday through Friday, call [phone].  After hours and on weekends, please call the GI answering service at [phone] who will take a message and have the physician on call contact you.   Following lower endoscopy (colonoscopy or flexible sigmoidoscopy):  Excessive amounts of blood in the stool  Significant tenderness or worsening of abdominal pains  Swelling of the abdomen that is new, acute  Fever of 100F or higher  Following upper endoscopy (EGD)  Vomiting of blood or coffee ground material  New chest pain or pain under the shoulder blades  Painful or persistently difficult swallowing  New shortness of breath  Fever of 100F or higher  Black, tarry-looking stools  FOLLOW UP: If any biopsies were taken you will be contacted by phone or by letter within the next 1-3 weeks.  Call your gastroenterologist if you have not heard about the biopsies in 3 weeks.  Our staff will call the home number listed on your records the next business day following your procedure to check on you and address any questions or concerns that you may have at that time regarding the information given to you following your procedure. This is a courtesy call and so if there is no answer at the home number and we have not heard from you through the emergency physician on call,  we will assume that you have returned to your regular daily activities without incident.  SIGNATURES/CONFIDENTIALITY: You and/or your care partner have signed paperwork which will be entered into your electronic medical record.  These signatures attest to the fact that that the information above on your After Visit Summary has been reviewed and is understood.  Full responsibility of the confidentiality of this discharge information lies with you and/or your care-partner.

## 2014-01-27 NOTE — Progress Notes (Signed)
Procedure ends, to recovery, report given and VSS. 

## 2014-01-27 NOTE — Progress Notes (Signed)
Pt states he had a Nitro SL on 01-25-14 d/t "chest heavinessWaynard Edwards CRNA made aware

## 2014-01-28 ENCOUNTER — Telehealth: Payer: Self-pay | Admitting: *Deleted

## 2014-01-28 NOTE — Telephone Encounter (Signed)
  Follow up Call-  Call back number 01/27/2014  Post procedure Call Back phone  # [phone]  Permission to leave phone message Yes     Patient questions:  Do you have a fever, pain , or abdominal swelling? No. Pain Score  0 *  Have you tolerated food without any problems? Yes.    Have you been able to return to your normal activities? Yes.    Do you have any questions about your discharge instructions: Diet   No. Medications  No. Follow up visit  No.  Do you have questions or concerns about your Care? No.  Actions: * If pain score is 4 or above: No action needed, pain <4.

## 2014-02-02 ENCOUNTER — Encounter: Payer: Self-pay | Admitting: Interventional Cardiology

## 2014-02-02 ENCOUNTER — Encounter: Payer: Self-pay | Admitting: Internal Medicine

## 2014-02-03 ENCOUNTER — Other Ambulatory Visit: Payer: Self-pay | Admitting: Internal Medicine

## 2014-03-04 ENCOUNTER — Other Ambulatory Visit: Payer: Self-pay

## 2014-03-06 DIAGNOSIS — Z887 Allergy status to serum and vaccine status: Secondary | ICD-10-CM | POA: Diagnosis not present

## 2014-03-06 DIAGNOSIS — E039 Hypothyroidism, unspecified: Secondary | ICD-10-CM | POA: Diagnosis not present

## 2014-03-06 DIAGNOSIS — Z8614 Personal history of Methicillin resistant Staphylococcus aureus infection: Secondary | ICD-10-CM | POA: Diagnosis not present

## 2014-03-06 DIAGNOSIS — J189 Pneumonia, unspecified organism: Secondary | ICD-10-CM | POA: Diagnosis not present

## 2014-03-06 DIAGNOSIS — R1110 Vomiting, unspecified: Secondary | ICD-10-CM | POA: Diagnosis not present

## 2014-03-06 DIAGNOSIS — R6883 Chills (without fever): Secondary | ICD-10-CM | POA: Diagnosis not present

## 2014-03-06 DIAGNOSIS — I509 Heart failure, unspecified: Secondary | ICD-10-CM | POA: Diagnosis not present

## 2014-03-06 DIAGNOSIS — R42 Dizziness and giddiness: Secondary | ICD-10-CM | POA: Diagnosis not present

## 2014-03-06 DIAGNOSIS — R918 Other nonspecific abnormal finding of lung field: Secondary | ICD-10-CM | POA: Diagnosis not present

## 2014-03-06 DIAGNOSIS — R112 Nausea with vomiting, unspecified: Secondary | ICD-10-CM | POA: Diagnosis not present

## 2014-03-06 DIAGNOSIS — E119 Type 2 diabetes mellitus without complications: Secondary | ICD-10-CM | POA: Diagnosis not present

## 2014-03-06 DIAGNOSIS — Z794 Long term (current) use of insulin: Secondary | ICD-10-CM | POA: Diagnosis not present

## 2014-03-06 DIAGNOSIS — Z951 Presence of aortocoronary bypass graft: Secondary | ICD-10-CM | POA: Diagnosis not present

## 2014-03-06 DIAGNOSIS — M129 Arthropathy, unspecified: Secondary | ICD-10-CM | POA: Diagnosis not present

## 2014-03-06 DIAGNOSIS — I4891 Unspecified atrial fibrillation: Secondary | ICD-10-CM | POA: Diagnosis not present

## 2014-03-06 DIAGNOSIS — E785 Hyperlipidemia, unspecified: Secondary | ICD-10-CM | POA: Diagnosis not present

## 2014-03-08 ENCOUNTER — Emergency Department (HOSPITAL_COMMUNITY): Payer: 59

## 2014-03-08 ENCOUNTER — Emergency Department (HOSPITAL_COMMUNITY)
Admission: EM | Admit: 2014-03-08 | Discharge: 2014-03-08 | Disposition: A | Discharge status: Home / Self Care | Payer: 59 | Attending: Emergency Medicine | Admitting: Emergency Medicine

## 2014-03-08 ENCOUNTER — Encounter (HOSPITAL_COMMUNITY): Payer: Self-pay | Admitting: Emergency Medicine

## 2014-03-08 DIAGNOSIS — I471 Supraventricular tachycardia, unspecified: Secondary | ICD-10-CM | POA: Insufficient documentation

## 2014-03-08 DIAGNOSIS — Z860100 Personal history of colon polyps, unspecified: Secondary | ICD-10-CM | POA: Insufficient documentation

## 2014-03-08 DIAGNOSIS — Z951 Presence of aortocoronary bypass graft: Secondary | ICD-10-CM | POA: Diagnosis not present

## 2014-03-08 DIAGNOSIS — E785 Hyperlipidemia, unspecified: Secondary | ICD-10-CM | POA: Insufficient documentation

## 2014-03-08 DIAGNOSIS — Z872 Personal history of diseases of the skin and subcutaneous tissue: Secondary | ICD-10-CM | POA: Insufficient documentation

## 2014-03-08 DIAGNOSIS — Z7982 Long term (current) use of aspirin: Secondary | ICD-10-CM | POA: Insufficient documentation

## 2014-03-08 DIAGNOSIS — R404 Transient alteration of awareness: Secondary | ICD-10-CM | POA: Diagnosis not present

## 2014-03-08 DIAGNOSIS — Z85528 Personal history of other malignant neoplasm of kidney: Secondary | ICD-10-CM | POA: Insufficient documentation

## 2014-03-08 DIAGNOSIS — E119 Type 2 diabetes mellitus without complications: Secondary | ICD-10-CM | POA: Insufficient documentation

## 2014-03-08 DIAGNOSIS — R5383 Other fatigue: Principal | ICD-10-CM

## 2014-03-08 DIAGNOSIS — R531 Weakness: Secondary | ICD-10-CM

## 2014-03-08 DIAGNOSIS — E039 Hypothyroidism, unspecified: Secondary | ICD-10-CM | POA: Insufficient documentation

## 2014-03-08 DIAGNOSIS — Z85118 Personal history of other malignant neoplasm of bronchus and lung: Secondary | ICD-10-CM | POA: Insufficient documentation

## 2014-03-08 DIAGNOSIS — I4891 Unspecified atrial fibrillation: Secondary | ICD-10-CM | POA: Diagnosis not present

## 2014-03-08 DIAGNOSIS — Z8719 Personal history of other diseases of the digestive system: Secondary | ICD-10-CM | POA: Insufficient documentation

## 2014-03-08 DIAGNOSIS — R1110 Vomiting, unspecified: Secondary | ICD-10-CM | POA: Insufficient documentation

## 2014-03-08 DIAGNOSIS — N183 Chronic kidney disease, stage 3 unspecified: Secondary | ICD-10-CM | POA: Insufficient documentation

## 2014-03-08 DIAGNOSIS — Z792 Long term (current) use of antibiotics: Secondary | ICD-10-CM | POA: Insufficient documentation

## 2014-03-08 DIAGNOSIS — I2510 Atherosclerotic heart disease of native coronary artery without angina pectoris: Secondary | ICD-10-CM | POA: Insufficient documentation

## 2014-03-08 DIAGNOSIS — Z794 Long term (current) use of insulin: Secondary | ICD-10-CM | POA: Insufficient documentation

## 2014-03-08 DIAGNOSIS — E559 Vitamin D deficiency, unspecified: Secondary | ICD-10-CM | POA: Diagnosis not present

## 2014-03-08 DIAGNOSIS — R5381 Other malaise: Secondary | ICD-10-CM | POA: Insufficient documentation

## 2014-03-08 DIAGNOSIS — R509 Fever, unspecified: Secondary | ICD-10-CM | POA: Diagnosis not present

## 2014-03-08 DIAGNOSIS — E669 Obesity, unspecified: Secondary | ICD-10-CM | POA: Insufficient documentation

## 2014-03-08 DIAGNOSIS — J438 Other emphysema: Secondary | ICD-10-CM | POA: Insufficient documentation

## 2014-03-08 DIAGNOSIS — D509 Iron deficiency anemia, unspecified: Secondary | ICD-10-CM | POA: Insufficient documentation

## 2014-03-08 DIAGNOSIS — R42 Dizziness and giddiness: Secondary | ICD-10-CM | POA: Insufficient documentation

## 2014-03-08 DIAGNOSIS — I129 Hypertensive chronic kidney disease with stage 1 through stage 4 chronic kidney disease, or unspecified chronic kidney disease: Secondary | ICD-10-CM | POA: Insufficient documentation

## 2014-03-08 DIAGNOSIS — Z79899 Other long term (current) drug therapy: Secondary | ICD-10-CM | POA: Diagnosis not present

## 2014-03-08 LAB — CBC
HCT: 38.2 % — ABNORMAL LOW (ref 39.0–52.0)
Hemoglobin: 13 g/dL (ref 13.0–17.0)
MCH: 31.9 pg (ref 26.0–34.0)
MCHC: 34 g/dL (ref 30.0–36.0)
MCV: 93.9 fL (ref 78.0–100.0)
Platelets: 169 10*3/uL (ref 150–400)
RBC: 4.07 MIL/uL — AB (ref 4.22–5.81)
RDW: 12.9 % (ref 11.5–15.5)
WBC: 5.3 10*3/uL (ref 4.0–10.5)

## 2014-03-08 LAB — BASIC METABOLIC PANEL
ANION GAP: 13 (ref 5–15)
BUN: 21 mg/dL (ref 6–23)
CHLORIDE: 101 meq/L (ref 96–112)
CO2: 24 meq/L (ref 19–32)
Calcium: 9.5 mg/dL (ref 8.4–10.5)
Creatinine, Ser: 1.78 mg/dL — ABNORMAL HIGH (ref 0.50–1.35)
GFR calc non Af Amer: 37 mL/min — ABNORMAL LOW (ref >90)
GFR, EST AFRICAN AMERICAN: 43 mL/min — AB (ref >90)
Glucose, Bld: 207 mg/dL — ABNORMAL HIGH (ref 70–99)
POTASSIUM: 4.2 meq/L (ref 3.7–5.3)
Sodium: 138 mEq/L (ref 137–147)

## 2014-03-08 LAB — CBG MONITORING, ED: Glucose-Capillary: 207 mg/dL — ABNORMAL HIGH (ref 70–99)

## 2014-03-08 LAB — I-STAT TROPONIN, ED: Troponin i, poc: 0.01 ng/mL (ref 0.00–0.08)

## 2014-03-08 MED ORDER — AZITHROMYCIN 250 MG PO TABS
500.0000 mg | ORAL_TABLET | Freq: Once | ORAL | Status: AC
  Administered 2014-03-08: 500 mg via ORAL
  Filled 2014-03-08: qty 2

## 2014-03-08 MED ORDER — SODIUM CHLORIDE 0.9 % IV BOLUS (SEPSIS)
1000.0000 mL | Freq: Once | INTRAVENOUS | Status: AC
  Administered 2014-03-08: 1000 mL via INTRAVENOUS

## 2014-03-08 MED ORDER — CEFTRIAXONE SODIUM 1 G IJ SOLR
1.0000 g | Freq: Once | INTRAMUSCULAR | Status: AC
  Administered 2014-03-08: 1 g via INTRAVENOUS
  Filled 2014-03-08: qty 10

## 2014-03-08 MED ORDER — AZITHROMYCIN 250 MG PO TABS: ORAL_TABLET | ORAL | Status: DC

## 2014-03-08 NOTE — Discharge Instructions (Signed)
Weakness Weakness is a lack of strength. It may be felt all over the body (generalized) or in one specific part of the body (focal). Some causes of weakness can be serious. You may need further medical evaluation, especially if you are elderly or you have a history of immunosuppression (such as chemotherapy or HIV), kidney disease, heart disease, or diabetes. CAUSES  Weakness can be caused by many different things, including:  Infection.  Physical exhaustion.  Internal bleeding or other blood loss that results in a lack of red blood cells (anemia).  Dehydration. This cause is more common in elderly people.  Side effects or electrolyte abnormalities from medicines, such as pain medicines or sedatives.  Emotional distress, anxiety, or depression.  Circulation problems, especially severe peripheral arterial disease.  Heart disease, such as rapid atrial fibrillation, bradycardia, or heart failure.  Nervous system disorders, such as Guillain-Barr syndrome, multiple sclerosis, or stroke. DIAGNOSIS  To find the cause of your weakness, your caregiver will take your history and perform a physical exam. Lab tests or X-rays may also be ordered, if needed. TREATMENT  Treatment of weakness depends on the cause of your symptoms and can vary greatly. HOME CARE INSTRUCTIONS   Rest as needed.  Eat a well-balanced diet.  Try to get some exercise every day.  Only take over-the-counter or prescription medicines as directed by your caregiver. SEEK MEDICAL CARE IF:   Your weakness seems to be getting worse or spreads to other parts of your body.  You develop new aches or pains. SEEK IMMEDIATE MEDICAL CARE IF:   You cannot perform your normal daily activities, such as getting dressed and feeding yourself.  You cannot walk up and down stairs, or you feel exhausted when you do so.  You have shortness of breath or chest pain.  You have difficulty moving parts of your body.  You have weakness  in only one area of the body or on only one side of the body.  You have a fever.  You have trouble speaking or swallowing.  You cannot control your bladder or bowel movements.  You have black or bloody vomit or stools. MAKE SURE YOU:  Understand these instructions.  Will watch your condition.  Will get help right away if you are not doing well or get worse. Document Released: 05/28/2005 Document Revised: 11/27/2011 Document Reviewed: 07/27/2011 [facility] Patient Information 2015 [place], [place]. This information is not intended to replace advice given to you by your health care provider. Make sure you discuss any questions you have with your health care provider.

## 2014-03-08 NOTE — ED Provider Notes (Signed)
CSN: [id number]     Arrival date & time 03/08/14  0930 History   First MD Initiated Contact with Patient 03/08/14 0930     Chief Complaint  Patient presents with  . Weakness     (Consider location/radiation/quality/duration/timing/severity/associated sxs/prior Treatment) HPI Comments: Diagnosed with pneumonia the 2 days ago, worsening weakness, dizziness, difficulty ambulating. Recently started eliquis for Afib. Is on levaquin for his PNA.   Patient is a 68 y.o. male presenting with weakness. The history is provided by the patient.  Weakness This is a new problem. The current episode started more than 2 days ago. The problem occurs constantly. The problem has been gradually worsening. Pertinent negatives include no chest pain and no shortness of breath. Nothing aggravates the symptoms. Nothing relieves the symptoms. He has tried nothing for the symptoms.    Past Medical History  Diagnosis Date  . Diabetes mellitus   . Hypertension   . Empyema lung     a. PNA 02/2011 c/b empyema requiring chest tube, drainage of pleural effusion and decortication.  . Nodule of left lung     a. Cavitary nodules 02/2011, f/u recommended.  . CKD (chronic kidney disease), stage III   . CAD (coronary artery disease)     a. s/p CABGx4 05/2009.  Marland Kitchen Renal cell carcinoma     a. s/p R nephrectomy 2002. Reported pulmonary mets per note at that time.  . Anemia   . Cocaine abuse in remission   . Paroxysmal supraventricular tachycardia     a. While inpatient 02/2011 sick with empyema - wide appearace due to RBBB.  Marland Kitchen Hypothyroidism   . Hyperlipidemia   . Iron deficiency anemia   . Vitamin D deficiency   . Scrotal abscess     a. 2012 2/2 Streptococcus group C, status post incision and drainage.  . Enlarged prostate   . Atrial fibrillation     a. Post-op CABG 05/2009. b. Re-identified 12/2013.  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  . Colon polyps   . Chronic constipation   . Obesity    . AAA (abdominal aortic aneurysm)     a. Aortic US 05/2012: 3.9 cm fusiform abdominal aortic aneurysm. F/u recommended 05/2014.  Marland Kitchen RBBB   . LAFB (left anterior fascicular block)   . Lung cancer     a. Tx with interleukin in 2002 per patient.   Past Surgical History  Procedure Laterality Date  . Bronchoscopy, right video-assisted thoracoscopy, minithoracotomy, drainage of pleural effusion, and decortication.  02/26/2011    [facility]  . Tooth extraction    . Irrigation and debridement of scrotal wound and closure of scrotal wound.    . Incision, drainage and debridement of perineum and  scrotum    . Cabg x 4  05/13/2009    Hendrickson  . Right radical nephrectomy.    Marland Kitchen Hernia repair    . Hemorrhoid surgery     Family History  Problem Relation Age of Onset  . Alzheimer's disease Mother   . Heart disease Father   . Pneumonia    . Colon cancer Neg Hx   . Esophageal cancer Neg Hx   . Stomach cancer Neg Hx   . Rectal cancer Neg Hx    History  Substance Use Topics  . Smoking status: Never Smoker   . Smokeless tobacco: Never Used  . Alcohol Use: No    Review of Systems  Constitutional: Positive for fever (2 days). Negative  for chills.  Respiratory: Negative for cough and shortness of breath.   Cardiovascular: Negative for chest pain and leg swelling.  Gastrointestinal: Positive for vomiting (2 days ago).  Neurological: Positive for dizziness and weakness.  All other systems reviewed and are negative.     Allergies  Atorvastatin and Tetanus toxoids  Home Medications   Prior to Admission medications   Medication Sig Start Date End Date Taking? Authorizing Provider  albuterol (PROVENTIL HFA;VENTOLIN HFA) 108 (90 BASE) MCG/ACT inhaler Inhale into the lungs every 6 (six) hours as needed for wheezing or shortness of breath.    Historical Provider, MD  amiodarone (PACERONE) 200 MG tablet Take 2 tablets (400 mg total) by mouth daily. 01/15/14   Jettie Booze, MD  aspirin  81 MG tablet Take 81 mg by mouth daily.    Historical Provider, MD  buPROPion (WELLBUTRIN XL) 150 MG 24 hr tablet Take 150 mg by mouth every morning.    Historical Provider, MD  capsaicin (ZOSTRIX) 0.025 % cream Apply 1 application topically 2 (two) times daily.    Historical Provider, MD  cholecalciferol (VITAMIN D) 1000 UNITS tablet Take 4,000 Units by mouth daily.     Historical Provider, MD  clotrimazole (LOTRIMIN) 1 % cream Apply 1 application topically 2 (two) times daily.    Historical Provider, MD  fenofibrate 54 MG tablet Take 54 mg by mouth daily.    Historical Provider, MD  ferrous sulfate dried (SLOW FE) 160 (50 FE) MG TBCR Take 160 mg by mouth 2 (two) times daily with a meal.      Historical Provider, MD  gabapentin (NEURONTIN) 300 MG capsule Take 300 mg by mouth 2 times daily at 12 noon and 4 pm.      Historical Provider, MD  glucose 4 GM chewable tablet Chew 1 tablet by mouth as needed for low blood sugar.    Historical Provider, MD  hydrocortisone-pramoxine [facility]) 2.5-1 % rectal cream Place 1 application rectally 3 (three) times daily as needed for hemorrhoids or itching.     Historical Provider, MD  insulin detemir (LEVEMIR) 100 UNIT/ML injection Inject 46 Units into the skin 2 (two) times daily.     Historical Provider, MD  insulin lispro (HUMALOG KWIKPEN) 100 UNIT/ML KiwkPen Inject 16 Units into the skin 3 (three) times daily. Take as directed    Historical Provider, MD  levothyroxine (SYNTHROID, LEVOTHROID) 100 MCG tablet Take 100 mcg by mouth daily before breakfast.     Historical Provider, MD  LIDOCAINE EX Apply 1 application topically 2 (two) times daily as needed.    Historical Provider, MD  Magnesium Oxide 420 MG TABS Take 1 tablet by mouth daily.    Historical Provider, MD  mupirocin ointment (BACTROBAN) 2 % Place 1 application into the nose daily.    Historical Provider, MD  nitroGLYCERIN (NITROSTAT) 0.4 MG SL tablet Place 0.4 mg under the tongue every 5 (five)  minutes as needed for chest pain.     Historical Provider, MD  NON FORMULARY theragesics for pain    Historical Provider, MD  nystatin cream (MYCOSTATIN) Apply 1 application topically 2 (two) times daily.    Historical Provider, MD  Omega-3 Fatty Acids (FISH OIL) 1000 MG CAPS Take 6,000 mg by mouth daily.    Historical Provider, MD  omeprazole (PRILOSEC) 40 MG capsule Take 40 mg by mouth daily.     Historical Provider, MD  oxyCODONE-acetaminophen (PERCOCET) 10-325 MG per tablet Take 1 tablet by mouth every 4 (four) hours  as needed for pain.    Historical Provider, MD  PANTOPRAZOLE SODIUM PO Take by mouth daily.    Historical Provider, MD  polyethylene glycol (MIRALAX / GLYCOLAX) packet Take 17 g by mouth daily.    Historical Provider, MD  polyethylene glycol powder (GLYCOLAX/MIRALAX) powder Dissolve 17 grams (1 capful) in at least 8 ounces water/juice and drink daily. 02/04/14   Jerene Bears, MD  psyllium (REGULOID) 0.52 G capsule Take 0.52 g by mouth daily.    Historical Provider, MD  tizanidine (ZANAFLEX) 2 MG capsule Take 2 mg by mouth as needed for muscle spasms.    Historical Provider, MD  traZODone (DESYREL) 50 MG tablet Take 50 mg by mouth at bedtime.    Historical Provider, MD   BP 105/63  Pulse 66  Temp(Src) 98.1 F (36.7 C) (Oral)  Resp 19  SpO2 98% Physical Exam  Nursing note and vitals reviewed. Constitutional: He is oriented to person, place, and time. He appears well-developed and well-nourished. No distress.  HENT:  Head: Normocephalic and atraumatic.  Mouth/Throat: No oropharyngeal exudate.  Eyes: EOM are normal. Pupils are equal, round, and reactive to light.  Neck: Normal range of motion. Neck supple.  Cardiovascular: Normal rate and regular rhythm.  Exam reveals no friction rub.   No murmur heard. Pulmonary/Chest: Effort normal and breath sounds normal. No respiratory distress. He has no wheezes. He has no rales.  Abdominal: He exhibits no distension. There is no  tenderness. There is no rebound.  Musculoskeletal: Normal range of motion. He exhibits no edema.  Neurological: He is alert and oriented to person, place, and time. No cranial nerve deficit or sensory deficit. He exhibits normal muscle tone. Coordination and gait normal. GCS eye subscore is 4. GCS verbal subscore is 5. GCS motor subscore is 6.  Skin: He is not diaphoretic.    ED Course  Procedures (including critical care time) Labs Review Labs Reviewed  CBG MONITORING, ED - Abnormal; Notable for the following:    Glucose-Capillary 207 (*)    All other components within normal limits  CBC  BASIC METABOLIC PANEL    Imaging Review No results found.   EKG Interpretation   Date/Time:  Monday March 08 2014 09:30:28 EDT Ventricular Rate:  66 PR Interval:  158 QRS Duration: 148 QT Interval:  457 QTC Calculation: 479 R Axis:   -82 Text Interpretation:  Sinus rhythm Right bundle branch block Inferior  infarct, old Anterior infarct, old RBBB similar to previous Confirmed by  Mingo Amber  MD, [place] ([id number]) on 03/08/2014 9:48:33 AM      MDM   Final diagnoses:  Weakness    68 year old male presents from home with dizziness today. He was stumbling around in the bathroom. He was recently placed on Eliquis within the past 2-3 days for A. fib. He is also recently diagnosed with pneumonia and is on Levaquin. He is improving as far as no fevers, less vomiting, but his dizziness is worsening. Wife is concerned that he is having reactions with his medications. He has a history of lower blood pressures in multiple medical problems. He stated on his last admission his blood pressure reached 80/50.  Here vitals stable. Neurologically intact, normal gait, no cranial nerve deficits, normal strength and sensation, normal coordination. Wife states much decreased by PO intake. Concern for some mild dehydration. Will repeat labs, chest x-ray. Also scan his head due to the recent initiation of Eliquis  difficulty ambulating. Does ambulate easily with me without any need  of assistance Sleeping comfortably. Labs normal. CXR normal. Wife is concerned about the levaquin interacting with his eliquis, changed antibiotics regimen to Rocephin/Azithro here for pneumonia diagnosed 2 days ago.  Patient ambulated easily without assistance. Can f/u with Dr. Orland Penman. Stable for discharge. Feeling better after fluids, rest.  Evelina Bucy, MD 03/08/14 [phone]

## 2014-03-08 NOTE — ED Notes (Signed)
Pt in from home via [facility], per report pt was having episodes of dizziness today while ambulating, pt recently started taking Amiodarone for A fib & was diagnosed with pneumonia Saturday & is taking Levaquin 750 mg for pneumonia, pt started on Eloquis 5 mg BID last wk, pt A&O x4, follows commands, speaks in complete sentences, denies SOB, CP, n/v/d

## 2014-03-08 NOTE — ED Notes (Addendum)
  Pt ambulated well and with a steady gait

## 2014-03-08 NOTE — ED Notes (Signed)
   CBG 207

## 2014-03-31 ENCOUNTER — Telehealth: Payer: Self-pay

## 2014-03-31 DIAGNOSIS — R911 Solitary pulmonary nodule: Secondary | ICD-10-CM

## 2014-03-31 NOTE — Telephone Encounter (Signed)
Patient notified of CT chest scheduled on 04/07/14 1:30.  He is asked to arrive at [facility] at 1:15 and be 2 hours NPO

## 2014-03-31 NOTE — Telephone Encounter (Signed)
Message copied by Marlon Pel on Wed Mar 31, 2014  9:45 AM ------      Message from: Marlon Pel      Created: Tue Dec 29, 2013  9:33 AM       Needs CT chest for follow up lung nodularity on CT from 12/28/13 ------

## 2014-04-07 ENCOUNTER — Telehealth: Payer: Self-pay

## 2014-04-07 ENCOUNTER — Ambulatory Visit (INDEPENDENT_AMBULATORY_CARE_PROVIDER_SITE_OTHER)
Admission: RE | Admit: 2014-04-07 | Discharge: 2014-04-07 | Disposition: A | Discharge status: Home / Self Care | Payer: 59 | Source: Ambulatory Visit | Attending: Internal Medicine | Admitting: Internal Medicine

## 2014-04-07 DIAGNOSIS — R911 Solitary pulmonary nodule: Secondary | ICD-10-CM

## 2014-04-07 NOTE — Telephone Encounter (Signed)
Pt for CT of chest today, creatinine today 1.78 and he only has one kidney. Radiologist reviewed previous CT and what we are looking for with this CT and states pt should have CT of chest without contrast. Dr. Hilarie Fredrickson aware.

## 2014-04-27 ENCOUNTER — Other Ambulatory Visit: Payer: Self-pay | Admitting: Interventional Cardiology

## 2014-04-27 ENCOUNTER — Telehealth: Payer: Self-pay | Admitting: Interventional Cardiology

## 2014-04-27 NOTE — Telephone Encounter (Signed)
New message    Patient was off eliquis for 48 hours . Need a letter stating is this okay .     Procedure on 11/18 @ 11:15.

## 2014-04-27 NOTE — Telephone Encounter (Signed)
Will forward to Dr. Irish Lack for the ok to send a statement that the patient could be Eliquis for 48 hours prior to a procedure on 11/18. History of a-fib. I do not see a documented history of stroke.

## 2014-04-28 ENCOUNTER — Encounter: Payer: Self-pay | Admitting: *Deleted

## 2014-04-28 NOTE — Telephone Encounter (Signed)
Tiffany advised I will forward to Dr Irish Lack to [facility] holding Eliquis 48 hours for a spinal injection.   Tiffany also requesting the record of this phone note be faxed to her.  I advised Tiffany that HIM would need to help her with that and attempted to transfer to HIM but was unsuccessful.  HIM advised I lost the call.

## 2014-04-28 NOTE — Telephone Encounter (Signed)
What is the procedure?

## 2014-04-28 NOTE — Telephone Encounter (Signed)
F/u    Tiffany need to speak to nurse concerning the note that was sent to her yesterday please call.

## 2014-04-28 NOTE — Telephone Encounter (Signed)
Will forward to Dr Irish Lack

## 2014-04-28 NOTE — Telephone Encounter (Signed)
I left a message for Tiffany, at [facility], to call me back regarding what type of procedure the patient is having.

## 2014-04-28 NOTE — Telephone Encounter (Signed)
Per Dr. Irish Lack- ok to hold Eliquis for 48 hours prior to epideral steroid injection. This may be resumed when the performing MD feels this is safe to do so. Letter done and signed by Dr. Irish Lack. Letter given to Dena in Medical Records to fax to [place] at [phone].

## 2014-04-28 NOTE — Telephone Encounter (Signed)
Follow Up         Tiffany calling again to check status of pt's clearance. Advised that it has been sent to Dr. Irish Lack for review and she will be contacted when the nurse gets a response from him. Stating that she needs clearance by cob today.

## 2014-04-28 NOTE — Telephone Encounter (Signed)
Forwarding to Dr. Irish Lack for his approval that the patient may hold eliquis 48 hours prior to steroid injection.

## 2014-04-28 NOTE — Telephone Encounter (Signed)
Follow Up       Tiffany calling stating pt is having a steroid injection and is needing clearance faxed to [phone].

## 2014-04-29 ENCOUNTER — Telehealth: Payer: Self-pay | Admitting: Interventional Cardiology

## 2014-04-30 NOTE — Telephone Encounter (Signed)
Received request from Nurse fax box:   To: [facility] Fax number: [phone] Attention:

## 2014-05-04 ENCOUNTER — Ambulatory Visit: Payer: Self-pay | Admitting: Interventional Cardiology

## 2014-05-14 ENCOUNTER — Other Ambulatory Visit: Payer: Self-pay | Admitting: Internal Medicine

## 2014-05-19 ENCOUNTER — Encounter: Payer: Self-pay | Admitting: Interventional Cardiology

## 2014-06-18 ENCOUNTER — Telehealth: Payer: Self-pay | Admitting: Internal Medicine

## 2014-06-18 NOTE — Telephone Encounter (Signed)
Wife states pt has not had a BM in 6 days. Pt takes miralax daily. Suggested they try increasing the miralax to 2-3 doses to have BM or they could try a bottle of mag citrate. Wife states they will try mag citrate because she does not feel the miralax is working. Wife also wanted to note that before his constipation at times he has been incontinent of stool in the bed. Dr. Hilarie Fredrickson notified.

## 2014-06-21 NOTE — Telephone Encounter (Signed)
He previously had diarrhea with Linzess and reported non-response to lubiprostone  Narcotic pain medication use chronically likely contributing to constipation  Agree with trying MiraLAX 2-3 times daily before trying another constipation medication  There is a new medication which has been recently approved for opioid-induced constipation , but it is quite expensive and would like to be sure MiraLAX truly will not help at higher dose first

## 2014-07-05 ENCOUNTER — Other Ambulatory Visit: Payer: Self-pay | Admitting: Internal Medicine

## 2014-07-13 ENCOUNTER — Other Ambulatory Visit: Payer: Self-pay

## 2014-07-13 ENCOUNTER — Telehealth: Payer: Self-pay

## 2014-07-13 DIAGNOSIS — K7460 Unspecified cirrhosis of liver: Secondary | ICD-10-CM

## 2014-07-13 NOTE — Telephone Encounter (Signed)
Pt scheduled for [place] ABD with elastography at [facility] 08/17/14@9am , pt to arrive there at 8:45am. Pt to be NPO after midnight. Pt aware of appt.

## 2014-07-13 NOTE — Telephone Encounter (Signed)
-----   Message from Maury Dus, RN sent at 02/10/2014  3:20 PM EDT ----- Regarding: [place]       Please arrange liver US with elastography to be performed in 5-6 months.  Enlarged caudate lobe by CT, evaluate for cirrhosis.  JMP     Due in February

## 2014-07-15 DIAGNOSIS — M79673 Pain in unspecified foot: Secondary | ICD-10-CM | POA: Diagnosis not present

## 2014-07-15 DIAGNOSIS — F411 Generalized anxiety disorder: Secondary | ICD-10-CM | POA: Diagnosis not present

## 2014-08-17 ENCOUNTER — Ambulatory Visit (HOSPITAL_COMMUNITY)
Admission: RE | Admit: 2014-08-17 | Discharge: 2014-08-17 | Disposition: A | Discharge status: Home / Self Care | Payer: 59 | Source: Ambulatory Visit | Attending: Internal Medicine | Admitting: Internal Medicine

## 2014-08-17 DIAGNOSIS — I714 Abdominal aortic aneurysm, without rupture: Secondary | ICD-10-CM | POA: Insufficient documentation

## 2014-08-17 DIAGNOSIS — K760 Fatty (change of) liver, not elsewhere classified: Secondary | ICD-10-CM | POA: Diagnosis not present

## 2014-08-17 DIAGNOSIS — K7460 Unspecified cirrhosis of liver: Secondary | ICD-10-CM

## 2014-08-20 DIAGNOSIS — K7581 Nonalcoholic steatohepatitis (NASH): Secondary | ICD-10-CM | POA: Diagnosis not present

## 2014-08-20 DIAGNOSIS — G4733 Obstructive sleep apnea (adult) (pediatric): Secondary | ICD-10-CM | POA: Diagnosis not present

## 2014-08-23 ENCOUNTER — Telehealth: Payer: Self-pay | Admitting: Internal Medicine

## 2014-08-23 NOTE — Telephone Encounter (Signed)
Left message to call back  

## 2014-08-25 NOTE — Telephone Encounter (Signed)
Results discussed, see result note.

## 2014-09-20 DIAGNOSIS — G894 Chronic pain syndrome: Secondary | ICD-10-CM | POA: Diagnosis not present

## 2014-09-20 DIAGNOSIS — L84 Corns and callosities: Secondary | ICD-10-CM | POA: Diagnosis not present

## 2014-09-20 DIAGNOSIS — M4806 Spinal stenosis, lumbar region: Secondary | ICD-10-CM | POA: Diagnosis not present

## 2014-09-20 DIAGNOSIS — E1142 Type 2 diabetes mellitus with diabetic polyneuropathy: Secondary | ICD-10-CM | POA: Diagnosis not present

## 2014-09-20 DIAGNOSIS — E1051 Type 1 diabetes mellitus with diabetic peripheral angiopathy without gangrene: Secondary | ICD-10-CM | POA: Diagnosis not present

## 2014-09-20 DIAGNOSIS — I739 Peripheral vascular disease, unspecified: Secondary | ICD-10-CM | POA: Diagnosis not present

## 2014-09-20 DIAGNOSIS — L603 Nail dystrophy: Secondary | ICD-10-CM | POA: Diagnosis not present

## 2014-09-20 DIAGNOSIS — M47817 Spondylosis without myelopathy or radiculopathy, lumbosacral region: Secondary | ICD-10-CM | POA: Diagnosis not present

## 2014-09-24 ENCOUNTER — Encounter: Payer: Self-pay | Admitting: *Deleted

## 2014-10-12 ENCOUNTER — Other Ambulatory Visit: Payer: Self-pay | Admitting: Internal Medicine

## 2014-10-12 ENCOUNTER — Other Ambulatory Visit: Payer: Self-pay | Admitting: Interventional Cardiology

## 2014-10-20 ENCOUNTER — Other Ambulatory Visit (INDEPENDENT_AMBULATORY_CARE_PROVIDER_SITE_OTHER): Payer: 59

## 2014-10-20 ENCOUNTER — Other Ambulatory Visit: Payer: Self-pay | Admitting: Internal Medicine

## 2014-10-20 ENCOUNTER — Ambulatory Visit (INDEPENDENT_AMBULATORY_CARE_PROVIDER_SITE_OTHER): Payer: 59 | Admitting: Internal Medicine

## 2014-10-20 ENCOUNTER — Encounter: Payer: Self-pay | Admitting: Internal Medicine

## 2014-10-20 VITALS — BP 116/58 | HR 60 | Ht 72.0 in | Wt 263.2 lb

## 2014-10-20 DIAGNOSIS — K5909 Other constipation: Secondary | ICD-10-CM

## 2014-10-20 DIAGNOSIS — Z86010 Personal history of colonic polyps: Secondary | ICD-10-CM

## 2014-10-20 DIAGNOSIS — K5900 Constipation, unspecified: Secondary | ICD-10-CM

## 2014-10-20 DIAGNOSIS — K7469 Other cirrhosis of liver: Secondary | ICD-10-CM | POA: Diagnosis not present

## 2014-10-20 DIAGNOSIS — K7581 Nonalcoholic steatohepatitis (NASH): Secondary | ICD-10-CM

## 2014-10-20 DIAGNOSIS — K219 Gastro-esophageal reflux disease without esophagitis: Secondary | ICD-10-CM

## 2014-10-20 DIAGNOSIS — R933 Abnormal findings on diagnostic imaging of other parts of digestive tract: Secondary | ICD-10-CM

## 2014-10-20 LAB — CBC WITH DIFFERENTIAL/PLATELET
BASOS PCT: 0.6 % (ref 0.0–3.0)
Basophils Absolute: 0 10*3/uL (ref 0.0–0.1)
Eosinophils Absolute: 0.2 10*3/uL (ref 0.0–0.7)
Eosinophils Relative: 3.4 % (ref 0.0–5.0)
HCT: 44.2 % (ref 39.0–52.0)
Hemoglobin: 15.1 g/dL (ref 13.0–17.0)
LYMPHS PCT: 24.6 % (ref 12.0–46.0)
Lymphs Abs: 1.3 10*3/uL (ref 0.7–4.0)
MCHC: 34.1 g/dL (ref 30.0–36.0)
MCV: 92.8 fl (ref 78.0–100.0)
Monocytes Absolute: 0.4 10*3/uL (ref 0.1–1.0)
Monocytes Relative: 8.5 % (ref 3.0–12.0)
NEUTROS PCT: 62.9 % (ref 43.0–77.0)
Neutro Abs: 3.3 10*3/uL (ref 1.4–7.7)
Platelets: 221 10*3/uL (ref 150.0–400.0)
RBC: 4.76 Mil/uL (ref 4.22–5.81)
RDW: 13.7 % (ref 11.5–15.5)
WBC: 5.3 10*3/uL (ref 4.0–10.5)

## 2014-10-20 LAB — COMPREHENSIVE METABOLIC PANEL
ALBUMIN: 4.1 g/dL (ref 3.5–5.2)
ALT: 82 U/L — ABNORMAL HIGH (ref 0–53)
AST: 68 U/L — AB (ref 0–37)
Alkaline Phosphatase: 34 U/L — ABNORMAL LOW (ref 39–117)
BILIRUBIN TOTAL: 0.6 mg/dL (ref 0.2–1.2)
BUN: 21 mg/dL (ref 6–23)
CO2: 29 mEq/L (ref 19–32)
Calcium: 9.8 mg/dL (ref 8.4–10.5)
Chloride: 97 mEq/L (ref 96–112)
Creatinine, Ser: 1.89 mg/dL — ABNORMAL HIGH (ref 0.40–1.50)
GFR: 45.47 mL/min — ABNORMAL LOW (ref >60.00)
Glucose, Bld: 387 mg/dL — ABNORMAL HIGH (ref 70–99)
POTASSIUM: 4.9 meq/L (ref 3.5–5.1)
SODIUM: 132 meq/L — AB (ref 135–145)
TOTAL PROTEIN: 7.5 g/dL (ref 6.0–8.3)

## 2014-10-20 LAB — PROTIME-INR
INR: 1.1 ratio — ABNORMAL HIGH (ref 0.8–1.0)
PROTHROMBIN TIME: 12.5 s (ref 9.6–13.1)

## 2014-10-20 MED ORDER — LINACLOTIDE 290 MCG PO CAPS: 290.0000 ug | ORAL_CAPSULE | Freq: Every day | ORAL | Status: DC

## 2014-10-20 MED ORDER — PANTOPRAZOLE SODIUM 40 MG PO TBEC: 40.0000 mg | DELAYED_RELEASE_TABLET | Freq: Two times a day (BID) | ORAL | Status: DC

## 2014-10-20 NOTE — Progress Notes (Signed)
Subjective:    Patient ID: Matthew [name], male    DOB: December 22, 1945, 69 y.o.   MRN: [id number]  HPI Matthew [name] is a 69 year old male with complicated past medical history including remote proctitis, adenomatous colon polyps, chronic constipation, chronic abdominal pain, fatty liver, CAD, history of renal cell carcinoma status post nephrectomy, obesity, hypertension, hyperlipidemia, diabetes who is seen in follow-up. Matthew [name] was last seen in the office in July 2015. Matthew [name] returns today for follow-up with Matthew [name].  Since this visit Matthew [name] had colonoscopy with snare polypectomy performed on 01/27/2014. This revealed 5 small sessile polyps which were removed. The colon was otherwise normal. 3 of these polyps are found to be adenomatous. Matthew [name] has continued to have trouble with constipation. MiraLAX which she has been using simply has not been helping. Matthew [name] understands that opioid use is worsening constipation. Previous samples of Linzess were prescribed another provider. The 145 g dose helps but not completely. Matthew [name] is interested in a higher dose. Matthew [name] continues to have chronic mid and upper abdominal pain. Matthew [name] is taking pantoprazole 40 mg twice daily which she feels works better than omeprazole. Matthew [name] denies heartburn or dysphagia. Matthew [name] has had chest CT to follow-up previously seen pulmonary lesions. Last CT was performed 09/06/2014 at [facility]. This showed area under surveillance in the right lower lobe with small foci of atelectasis no further surveillance felt necessary. Incidentally it showed small hiatal hernia and diffuse esophageal thickening. Matthew [name] does endorse frequent belching but denies early satiety, nausea or vomiting. Matthew [name] is taking oral iron daily for Matthew history of iron deficiency.  Matthew [name] had previous imaging which showed an enlarged caudate lobe of the liver and this led to an ultrasound with elastography which was performed in March. This showed hepatic steatosis. Aneurysmal dilatated and of the abdominal  aorta. Fibrosis score was F3/F4. Matthew [name] denies jaundice, abdominal and lower extremity edema, confusion or bleeding.  Review of Systems As per history of present illness, otherwise negative  Current Medications, Allergies, Past Medical History, Past Surgical History, Family History and Social History were reviewed in [employer] record.     Objective:   Physical Exam BP 116/58 mmHg  Pulse 60  Ht 6' (1.829 m)  Wt 263 lb 4 oz (119.409 kg)  BMI 35.70 kg/m2 Constitutional: Well-developed and well-nourished. No distress. HEENT: Normocephalic and atraumatic. Oropharynx is clear and moist. No oropharyngeal exudate. Conjunctivae are normal.  No scleral icterus. Neck: Neck supple. Trachea midline. Cardiovascular: Normal rate, regular rhythm and intact distal pulses. Pulmonary/chest: Effort normal and breath sounds normal. No wheezing, rales or rhonchi. Abdominal: Soft, obese, mild diffuse tenderness without rebound or guarding, nondistended. Bowel sounds active throughout.  Extremities: no clubbing, cyanosis, or edema Lymphadenopathy: No cervical adenopathy noted. Neurological: Alert and oriented to person place and time. Skin: Skin is warm and dry. No rashes noted. Psychiatric: Normal mood and affect. Behavior is normal.  CBC    Component Value Date/Time   WBC 5.3 10/20/2014 1225   RBC 4.76 10/20/2014 1225   HGB 15.1 10/20/2014 1225   HCT 44.2 10/20/2014 1225   PLT 221.0 10/20/2014 1225   MCV 92.8 10/20/2014 1225   MCH 31.9 03/08/2014 0940   MCHC 34.1 10/20/2014 1225   RDW 13.7 10/20/2014 1225   LYMPHSABS 1.3 10/20/2014 1225   MONOABS 0.4 10/20/2014 1225   EOSABS 0.2 10/20/2014 1225   BASOSABS 0.0 10/20/2014 1225     CMP     Component Value Date/Time  NA 132* 10/20/2014 1225   K 4.9 10/20/2014 1225   CL 97 10/20/2014 1225   CO2 29 10/20/2014 1225   GLUCOSE 387* 10/20/2014 1225   BUN 21 10/20/2014 1225   CREATININE 1.89* 10/20/2014 1225   CALCIUM 9.8  10/20/2014 1225   PROT 7.5 10/20/2014 1225   ALBUMIN 4.1 10/20/2014 1225   AST 68* 10/20/2014 1225   ALT 82* 10/20/2014 1225   ALKPHOS 34* 10/20/2014 1225   BILITOT 0.6 10/20/2014 1225   GFRNONAA 37* 03/08/2014 0940   GFRAA 55* 03/08/2014 0940    Lab Results  Component Value Date   INR 1.1* 10/20/2014   INR 1.11 02/25/2011   INR 1.06 02/23/2011   Radiology reviewed as per HPI COlonoscopy reviewed including with pt and [name]     Assessment & Plan:  69 year old male with complicated past medical history including remote proctitis, adenomatous colon polyps, chronic constipation, chronic abdominal pain, fatty liver, CAD, history of renal cell carcinoma status post nephrectomy, obesity, hypertension, hyperlipidemia, diabetes who is seen in follow-up.  1. 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 We spent a great deal of time today discussing this possible and probable diagnosis. Matthew [name] has mild transaminitis likely related to fatty liver disease. Previous viral testing reportedly negative. Matthew [name] does not drink alcohol. I recommended vitamin E 800 international units daily. As another test for fibrosis we will order a liver fibrosure test today. INR stable though slightly elevated at 1.1. No evidence of portal hypertension as platelets are normal. I have recommended upper endoscopy for variceal screening. We discussed the test including the risks and benefits and Matthew [name] is agreeable to proceed. We will need permission from Matthew prescribing provider to hold Eliquis before this test.  2. Opioid-induced constipation -- Linzess 290 g daily. Call if diarrhea or no improvement  3. GERD/abnormal esophagus by CT -- EGD as discussed in #1 will also evaluate esophageal thickening seen by CT. Continue pantoprazole 40 mg twice daily. DC omeprazole  4. Remote proctitis -- no evidence of IBD at last colonoscopy.  5. Adenomatous colon polyps -- repeat colonoscopy  recommended August 2018 for screening  Follow-up in 3 months, sooner if necessary Over 25 minutes spent with the patient and Matthew [name] today discussing the above medical issues

## 2014-10-20 NOTE — Patient Instructions (Signed)
Your physician has requested that you go to the basement for the following lab work before leaving today: Liver labs, Fibrosure  Please discontinue omeprazole.  We have sent the following medications to your pharmacy for you to pick up at your convenience: Pantoprazole 40 mg twice daily (in place of omeprazole) Linzess 290 mcg daily.  Please purchase the following medications over the counter and take as directed: Vitamin E 800 IU daily (you will purchase 400 mg tablets and take 2 at a time)  You have been scheduled for an endoscopy. Please follow written instructions given to you at your visit today. If you use inhalers (even only as needed), please bring them with you on the day of your procedure. Your physician has requested that you go to www.startemmi.com and enter the access code given to you at your visit today. This web site gives a general overview about your procedure. However, you should still follow specific instructions given to you by our office regarding your preparation for the procedure.  We will contact your [place] physician about holding Eliquis prior to your procedure. If you have not heard back from [place] at least 1 week prior to procedure, please call us at [phone].

## 2014-10-21 NOTE — Addendum Note (Signed)
Addended by: Larina Bras on: 10/21/2014 03:14 PM   Modules accepted: Orders

## 2014-10-23 LAB — LIVER FIBROSIS, FIBROTEST-ACTITEST
ALPHA-2-MACROGLOBULIN: 345 mg/dL — AB (ref 106–279)
ALT: 79 U/L — ABNORMAL HIGH (ref 9–46)
Apolipoprotein A1: 116 mg/dL (ref 94–176)
BILIRUBIN: 0.7 mg/dL (ref 0.2–1.2)
Fibrosis Score: 0.77
GGT: 41 U/L (ref 3–70)
Haptoglobin: 117 mg/dL (ref 43–212)
NECROINFLAMMAT ACT SCORE: 0.64
Reference ID: 1214985

## 2014-10-26 ENCOUNTER — Telehealth: Payer: Self-pay | Admitting: Internal Medicine

## 2014-10-26 NOTE — Telephone Encounter (Signed)
Pts wife states this is an old message.

## 2014-10-27 ENCOUNTER — Telehealth: Payer: Self-pay | Admitting: *Deleted

## 2014-10-27 NOTE — Telephone Encounter (Signed)
We have received correspondance from [place]. Downer, pharm D for Dr [facility]) who writes "[name] is prescribed Eliquis anticoagulation therapy for his Atrial fibrillation. Based on risk assessment, [name]'s CHADS2 score is 1 for his only know risk factor of hypertension. Per CHEST guidelines, [name] is considered low risk for thromboembolism and anticoagulation may be interrupted periprocedurally without need to any bridging therapy.    Based on the short half-life of Eliquis, stopping his anticoagulation therapy 48 hours prior to procedure would be appropriate. Most patients may resume anticoagulation the day after procedure, unless there are bleeding concerns following procedure."  I have spoken to both [name] and [name] to advise patient may hold Eliquis 48 hours prior to procedure. I have also advised that fibrosis panel (under labs) indicates severe fibrosis/cirrhosis (assoc. With fatty liver) and EGD procedure should be kept to r/o varices. [name] both verbalize understanding.

## 2014-11-09 ENCOUNTER — Other Ambulatory Visit: Payer: Self-pay | Admitting: Interventional Cardiology

## 2014-11-09 ENCOUNTER — Other Ambulatory Visit: Payer: Self-pay | Admitting: Internal Medicine

## 2014-11-10 ENCOUNTER — Ambulatory Visit (AMBULATORY_SURGERY_CENTER): Payer: 59 | Admitting: Internal Medicine

## 2014-11-10 ENCOUNTER — Encounter: Payer: Self-pay | Admitting: Internal Medicine

## 2014-11-10 VITALS — BP 120/76 | HR 52 | Temp 96.5°F | Resp 18 | Ht 72.0 in | Wt 263.0 lb

## 2014-11-10 DIAGNOSIS — K219 Gastro-esophageal reflux disease without esophagitis: Secondary | ICD-10-CM

## 2014-11-10 DIAGNOSIS — B199 Unspecified viral hepatitis without hepatic coma: Secondary | ICD-10-CM | POA: Diagnosis not present

## 2014-11-10 DIAGNOSIS — K7581 Nonalcoholic steatohepatitis (NASH): Secondary | ICD-10-CM | POA: Diagnosis not present

## 2014-11-10 DIAGNOSIS — K2270 Barrett's esophagus without dysplasia: Secondary | ICD-10-CM | POA: Diagnosis not present

## 2014-11-10 DIAGNOSIS — K317 Polyp of stomach and duodenum: Secondary | ICD-10-CM

## 2014-11-10 DIAGNOSIS — K259 Gastric ulcer, unspecified as acute or chronic, without hemorrhage or perforation: Secondary | ICD-10-CM | POA: Diagnosis not present

## 2014-11-10 DIAGNOSIS — Z951 Presence of aortocoronary bypass graft: Secondary | ICD-10-CM | POA: Diagnosis not present

## 2014-11-10 DIAGNOSIS — I2510 Atherosclerotic heart disease of native coronary artery without angina pectoris: Secondary | ICD-10-CM | POA: Diagnosis not present

## 2014-11-10 LAB — GLUCOSE, CAPILLARY
GLUCOSE-CAPILLARY: 105 mg/dL — AB (ref 65–99)
Glucose-Capillary: 104 mg/dL — ABNORMAL HIGH (ref 65–99)

## 2014-11-10 MED ORDER — SODIUM CHLORIDE 0.9 % IV SOLN: 500.0000 mL | INTRAVENOUS | Status: DC

## 2014-11-10 NOTE — Progress Notes (Signed)
A/ox3, pleased with MAC, report to RN 

## 2014-11-10 NOTE — Op Note (Addendum)
[facility]  [address] [place], [id number]   ENDOSCOPY PROCEDURE REPORT  PATIENT: Matthew, [name]  MR#: [id number] BIRTHDATE: 1946-03-07 , 68  yrs. old GENDER: male ENDOSCOPIST: Jerene Bears, MD PROCEDURE DATE:  11/10/2014 PROCEDURE:  EGD, diagnostic and EGD w/ biopsy ASA CLASS:     Class III INDICATIONS:  NASH cirrhosis for variceal screening; esophageal thickening suggested by CT scan. MEDICATIONS: Monitored anesthesia care and Propofol 120 mg IV TOPICAL ANESTHETIC: none  DESCRIPTION OF PROCEDURE: After the risks benefits and alternatives of the procedure were thoroughly explained, informed consent was obtained.  The LB [id number] endoscope was introduced through the mouth and advanced to the second portion of the duodenum , Without limitations.  The instrument was slowly withdrawn as the mucosa was fully examined.    ESOPHAGUS: There was a 2cm segment of suspected Barrett's esophagus found in the lower third of the esophagus.  There was no nodular mucosa noted in the Barrett's segment.  Multiple biopsies were performed using cold forceps.  Sample obtained to rule out Barrett's esophagus.  If confirmed C0M2. The esophagus was otherwise normal with no evidence of esophageal varices  STOMACH: Multiple sessile and pedunculated polyps (broad based) with friable surfaces were found throughout the entire examined stomach. The polyps range in size from 5 mm to 30 mm. The polyps are most prevalent in the gastric fundus and gastric body with only several polyps in the gastric antrum. Multiple sampling biopsies were performed using cold forceps.  DUODENUM: The duodenal mucosa showed no abnormalities in the bulb and 2nd part of the duodenum.  Retroflexed views revealed as previously described.   No gastric varices seen.    The scope was then withdrawn from the patient and the procedure completed.  COMPLICATIONS: There were no immediate  complications.  ENDOSCOPIC IMPRESSION: 1.   There was a 2cm segment of suspected Barrett's esophagus found in the lower third of the esophagus; multiple biopsies were performed 2.   Multiple gastric polyps, ranging in size from small to large, were found in the entire examined stomach; multiple sampling biopsies were performed 3.   The duodenal mucosa showed no abnormalities in the bulb and 2nd part of the duodenum  RECOMMENDATIONS: 1.  Await biopsy results 2.  Would hold Eliquis 2 additional days given biopsies of friable polyps today 3.  Continue pantoprazole 4.  Avoid NSAIDs  eSigned:  Jerene Bears, MD 11/10/2014 11:31 AM Revised: 11/10/2014 11:31 AM   CC: the patient, Gavin Pound, MD  PATIENT NAME:  Matthew, [name] MR#: [id number]

## 2014-11-10 NOTE — Patient Instructions (Signed)
YOU HAD AN ENDOSCOPIC PROCEDURE TODAY AT [facility]:   Refer to the procedure report that was given to you for any specific questions about what was found during the examination.  If the procedure report does not answer your questions, please call your gastroenterologist to clarify.  If you requested that your care partner not be given the details of your procedure findings, then the procedure report has been included in a sealed envelope for you to review at your convenience later.  YOU SHOULD EXPECT: Some feelings of bloating in the abdomen. Passage of more gas than usual.  Walking can help get rid of the air that was put into your GI tract during the procedure and reduce the bloating. If you had a lower endoscopy (such as a colonoscopy or flexible sigmoidoscopy) you may notice spotting of blood in your stool or on the toilet paper. If you underwent a bowel prep for your procedure, you may not have a normal bowel movement for a few days.  Please Note:  You might notice some irritation and congestion in your nose or some drainage.  This is from the oxygen used during your procedure.  There is no need for concern and it should clear up in a day or so.  SYMPTOMS TO REPORT IMMEDIATELY:     Following upper endoscopy (EGD)  Vomiting of blood or coffee ground material  New chest pain or pain under the shoulder blades  Painful or persistently difficult swallowing  New shortness of breath  Fever of 100F or higher  Black, tarry-looking stools  For urgent or emergent issues, a gastroenterologist can be reached at any hour by calling [phone].   DIET: Your first meal following the procedure should be a small meal and then it is ok to progress to your normal diet. Heavy or fried foods are harder to digest and may make you feel nauseous or bloated.  Likewise, meals heavy in dairy and vegetables can increase bloating.  Drink plenty of fluids but you should avoid alcoholic beverages  for 24 hours.  ACTIVITY:  You should plan to take it easy for the rest of today and you should NOT DRIVE or use heavy machinery until tomorrow (because of the sedation medicines used during the test).    FOLLOW UP: Our staff will call the number listed on your records the next business day following your procedure to check on you and address any questions or concerns that you may have regarding the information given to you following your procedure. If we do not reach you, we will leave a message.  However, if you are feeling well and you are not experiencing any problems, there is no need to return our call.  We will assume that you have returned to your regular daily activities without incident.  If any biopsies were taken you will be contacted by phone or by letter within the next 1-3 weeks.  Please call us at [phone] if you have not heard about the biopsies in 3 weeks.    SIGNATURES/CONFIDENTIALITY: You and/or your care partner have signed paperwork which will be entered into your electronic medical record.  These signatures attest to the fact that that the information above on your After Visit Summary has been reviewed and is understood.  Full responsibility of the confidentiality of this discharge information lies with you and/or your care-partner.   AWAIT BIOPSY RESULTS  HOLD WARFARIN 2 MORE DAYS   CONTINUE PANTOPRAZOLE  AVOID ASPIRIN AND  ANTI INFLAMMATORY PRODUCTS

## 2014-11-10 NOTE — Progress Notes (Signed)
Called to room to assist during endoscopic procedure.  Patient ID and intended procedure confirmed with present staff. Received instructions for my participation in the procedure from the performing physician.  

## 2014-11-11 ENCOUNTER — Telehealth: Payer: Self-pay | Admitting: Emergency Medicine

## 2014-11-11 ENCOUNTER — Other Ambulatory Visit: Payer: Self-pay

## 2014-11-11 MED ORDER — AMIODARONE HCL 200 MG PO TABS: ORAL_TABLET | ORAL | Status: DC

## 2014-11-11 NOTE — Telephone Encounter (Signed)
  Follow up Call-  Call back number 11/10/2014 01/27/2014  Post procedure Call Back phone  # [phone]  Permission to leave phone message Yes Yes     Patient questions:  Do you have a fever, pain , or abdominal swelling? No. Pain Score  0 *  Have you tolerated food without any problems? Yes.    Have you been able to return to your normal activities? Yes.    Do you have any questions about your discharge instructions: Diet   No. Medications  No. Follow up visit  No.  Do you have questions or concerns about your Care? No.  Actions: * If pain score is 4 or above: No action needed, pain <4.

## 2014-11-11 NOTE — Telephone Encounter (Signed)
Per note 7.30.15

## 2014-11-18 DIAGNOSIS — M47817 Spondylosis without myelopathy or radiculopathy, lumbosacral region: Secondary | ICD-10-CM | POA: Diagnosis not present

## 2014-11-18 DIAGNOSIS — G894 Chronic pain syndrome: Secondary | ICD-10-CM | POA: Diagnosis not present

## 2014-11-18 DIAGNOSIS — E1142 Type 2 diabetes mellitus with diabetic polyneuropathy: Secondary | ICD-10-CM | POA: Diagnosis not present

## 2014-11-18 DIAGNOSIS — M4806 Spinal stenosis, lumbar region: Secondary | ICD-10-CM | POA: Diagnosis not present

## 2014-11-19 ENCOUNTER — Encounter: Payer: Self-pay | Admitting: Internal Medicine

## 2014-12-01 DIAGNOSIS — I2510 Atherosclerotic heart disease of native coronary artery without angina pectoris: Secondary | ICD-10-CM | POA: Diagnosis not present

## 2014-12-01 DIAGNOSIS — E119 Type 2 diabetes mellitus without complications: Secondary | ICD-10-CM | POA: Diagnosis not present

## 2014-12-01 DIAGNOSIS — K589 Irritable bowel syndrome without diarrhea: Secondary | ICD-10-CM | POA: Diagnosis not present

## 2014-12-01 DIAGNOSIS — E039 Hypothyroidism, unspecified: Secondary | ICD-10-CM | POA: Diagnosis not present

## 2014-12-01 DIAGNOSIS — E785 Hyperlipidemia, unspecified: Secondary | ICD-10-CM | POA: Diagnosis not present

## 2014-12-01 DIAGNOSIS — E559 Vitamin D deficiency, unspecified: Secondary | ICD-10-CM | POA: Diagnosis not present

## 2014-12-01 DIAGNOSIS — Z794 Long term (current) use of insulin: Secondary | ICD-10-CM | POA: Diagnosis not present

## 2014-12-01 DIAGNOSIS — Z86010 Personal history of colonic polyps: Secondary | ICD-10-CM | POA: Diagnosis not present

## 2014-12-01 DIAGNOSIS — I714 Abdominal aortic aneurysm, without rupture: Secondary | ICD-10-CM | POA: Diagnosis not present

## 2014-12-01 DIAGNOSIS — K219 Gastro-esophageal reflux disease without esophagitis: Secondary | ICD-10-CM | POA: Diagnosis not present

## 2014-12-01 DIAGNOSIS — K5190 Ulcerative colitis, unspecified, without complications: Secondary | ICD-10-CM | POA: Diagnosis not present

## 2014-12-01 DIAGNOSIS — N183 Chronic kidney disease, stage 3 (moderate): Secondary | ICD-10-CM | POA: Diagnosis not present

## 2014-12-09 ENCOUNTER — Telehealth: Payer: Self-pay | Admitting: Internal Medicine

## 2014-12-09 MED ORDER — SUCRALFATE 1 GM/10ML PO SUSP: 1.0000 g | Freq: Three times a day (TID) | ORAL | Status: DC

## 2014-12-09 NOTE — Telephone Encounter (Signed)
Left message for pt to call back.  Pts wife states that he has been having abdominal pain for about a week. States the past 2 days it has "gotten really bad." State he almost went to the ER yesterday. Pt is scheduled to see Dr. Arsenio Loader at [facility] for endoscopic resection of large gastric polyps later in July. Pt would like something for pain. Please advise.

## 2014-12-09 NOTE — Telephone Encounter (Signed)
Spoke with pt and he states he is doing pretty good with the linzess and bowel movements. Script sent to pharmacy and pt to call back if no better.

## 2014-12-09 NOTE — Telephone Encounter (Signed)
Please ensure he is taking his Linzess and having normal bowel movements Can try Carafate 1 g before meals and at bedtime for abdominal pain Notify us if not improving

## 2014-12-10 ENCOUNTER — Telehealth: Payer: Self-pay | Admitting: Internal Medicine

## 2014-12-10 MED ORDER — TRAMADOL HCL 50 MG PO TABS: ORAL_TABLET | ORAL | Status: DC

## 2014-12-10 NOTE — Telephone Encounter (Signed)
Pyrtle pt with large gastric polyps. Pt is scheduled to see Dr. Arsenio Loader at [facility] later in July for endoscopic resection of large polyps. Pt states he is having a lot of abdominal. Carafate was prescribed yesterday and pt states this is not touching the pain. Per Alonza Bogus PA pt may have Tramadol 70m #20 1 po every 4-6 prn pain. Script sent to pharmacy. Pt aware.

## 2014-12-20 ENCOUNTER — Telehealth: Payer: Self-pay | Admitting: Internal Medicine

## 2014-12-20 MED ORDER — TRAMADOL HCL 50 MG PO TABS: ORAL_TABLET | ORAL | Status: DC

## 2014-12-20 NOTE — Telephone Encounter (Signed)
Wife states Matthew [name] is having a lot of pain where the large gastric polyps are present. Matthew [name] was prescribed ultram 30m #20 1 every 4-6 hours prn pain on 12/10/14. Dr. PHilarie Fredricksonare you ok refilling script? Please advise.

## 2014-12-20 NOTE — Telephone Encounter (Signed)
Spoke with pt and he states the tramadol helped a little bit. Discussed with pt that if the pain was that bad he should go to the ER. Pt verbalized understanding and just requests a refill on the med. Per Dr. Hilarie Fredrickson ok to call in refill of med. Script called to pharmacy.

## 2014-12-22 ENCOUNTER — Telehealth: Payer: Self-pay | Admitting: Internal Medicine

## 2014-12-22 NOTE — Telephone Encounter (Signed)
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 Pt wanted to make sure this would not interfere with his procedure. Discussed with pt that when he meets with the physician at [facility] next week he can discuss it with him. He would be the one to decide if this would be an issue with proceeding with procedure. Pt verbalized understanding.

## 2014-12-23 DIAGNOSIS — Z79891 Long term (current) use of opiate analgesic: Secondary | ICD-10-CM | POA: Diagnosis not present

## 2014-12-23 DIAGNOSIS — M47817 Spondylosis without myelopathy or radiculopathy, lumbosacral region: Secondary | ICD-10-CM | POA: Diagnosis not present

## 2014-12-23 DIAGNOSIS — G894 Chronic pain syndrome: Secondary | ICD-10-CM | POA: Diagnosis not present

## 2014-12-23 DIAGNOSIS — E1142 Type 2 diabetes mellitus with diabetic polyneuropathy: Secondary | ICD-10-CM | POA: Diagnosis not present

## 2014-12-23 DIAGNOSIS — M4806 Spinal stenosis, lumbar region: Secondary | ICD-10-CM | POA: Diagnosis not present

## 2015-01-14 ENCOUNTER — Other Ambulatory Visit: Payer: Self-pay | Admitting: Internal Medicine

## 2015-02-13 ENCOUNTER — Other Ambulatory Visit: Payer: Self-pay | Admitting: Internal Medicine

## 2015-03-09 DIAGNOSIS — E1142 Type 2 diabetes mellitus with diabetic polyneuropathy: Secondary | ICD-10-CM | POA: Diagnosis not present

## 2015-03-09 DIAGNOSIS — M4806 Spinal stenosis, lumbar region: Secondary | ICD-10-CM | POA: Diagnosis not present

## 2015-03-09 DIAGNOSIS — M47817 Spondylosis without myelopathy or radiculopathy, lumbosacral region: Secondary | ICD-10-CM | POA: Diagnosis not present

## 2015-03-09 DIAGNOSIS — G894 Chronic pain syndrome: Secondary | ICD-10-CM | POA: Diagnosis not present

## 2015-03-31 DIAGNOSIS — N183 Chronic kidney disease, stage 3 (moderate): Secondary | ICD-10-CM | POA: Diagnosis not present

## 2015-03-31 DIAGNOSIS — K219 Gastro-esophageal reflux disease without esophagitis: Secondary | ICD-10-CM | POA: Diagnosis not present

## 2015-03-31 DIAGNOSIS — K5190 Ulcerative colitis, unspecified, without complications: Secondary | ICD-10-CM | POA: Diagnosis not present

## 2015-03-31 DIAGNOSIS — K589 Irritable bowel syndrome without diarrhea: Secondary | ICD-10-CM | POA: Diagnosis not present

## 2015-03-31 DIAGNOSIS — E039 Hypothyroidism, unspecified: Secondary | ICD-10-CM | POA: Diagnosis not present

## 2015-03-31 DIAGNOSIS — Z86010 Personal history of colonic polyps: Secondary | ICD-10-CM | POA: Diagnosis not present

## 2015-03-31 DIAGNOSIS — E559 Vitamin D deficiency, unspecified: Secondary | ICD-10-CM | POA: Diagnosis not present

## 2015-03-31 DIAGNOSIS — I714 Abdominal aortic aneurysm, without rupture: Secondary | ICD-10-CM | POA: Diagnosis not present

## 2015-03-31 DIAGNOSIS — I2510 Atherosclerotic heart disease of native coronary artery without angina pectoris: Secondary | ICD-10-CM | POA: Diagnosis not present

## 2015-03-31 DIAGNOSIS — E119 Type 2 diabetes mellitus without complications: Secondary | ICD-10-CM | POA: Diagnosis not present

## 2015-03-31 DIAGNOSIS — Z794 Long term (current) use of insulin: Secondary | ICD-10-CM | POA: Diagnosis not present

## 2015-03-31 DIAGNOSIS — E785 Hyperlipidemia, unspecified: Secondary | ICD-10-CM | POA: Diagnosis not present

## 2015-04-28 ENCOUNTER — Telehealth: Payer: Self-pay | Admitting: Interventional Cardiology

## 2015-04-28 NOTE — Telephone Encounter (Signed)
I left a message on the nurse's line to call office.

## 2015-04-28 NOTE — Telephone Encounter (Signed)
New message      Request for surgical clearance:  What type of surgery is being performed? Epidural steroid injection When is this surgery scheduled?  Pt is there Are there any medications that need to be held prior to surgery and how long? eliquis Name of physician performing surgery? Dr Greta Doom 1. What is your office phone and fax number? Fax [phone] 2. Pt is there----please fax ok or call if it will not be possible to get a clearance now

## 2015-04-28 NOTE — Telephone Encounter (Signed)
Received return call from [place].  He reports pt stopped Eliquis on his own 2 days ago for injection.  Injection was done today.  Thurmond Butts states nothing else is needed from our office at this time.  Pt is past due for follow up in our office.  Message sent to scheduling to contact him to schedule appt.

## 2015-05-12 ENCOUNTER — Ambulatory Visit (INDEPENDENT_AMBULATORY_CARE_PROVIDER_SITE_OTHER): Payer: 59 | Admitting: Cardiology

## 2015-05-12 ENCOUNTER — Encounter: Payer: Self-pay | Admitting: Cardiology

## 2015-05-12 VITALS — BP 90/60 | HR 71 | Ht 72.0 in | Wt 271.8 lb

## 2015-05-12 DIAGNOSIS — I2510 Atherosclerotic heart disease of native coronary artery without angina pectoris: Secondary | ICD-10-CM

## 2015-05-12 DIAGNOSIS — I2583 Coronary atherosclerosis due to lipid rich plaque: Principal | ICD-10-CM

## 2015-05-12 MED ORDER — FENOFIBRATE 54 MG PO TABS: 54.0000 mg | ORAL_TABLET | Freq: Every day | ORAL | Status: DC

## 2015-05-12 NOTE — Progress Notes (Signed)
05/12/2015 Matthew [name]   1945/10/11  [id number]  Primary Physician No PCP Per Patient Primary Cardiologist: Dr. Irish Lack  Reason for Visit/CC: Follow up CAD  HPI:  The patient is a 69 year old male, followed by Dr. Irish Lack, who presents to clinic today for routine follow-up. He has a hit history of CAD, status post CABG. He also has a prior history of mono which heart hospitalization. He was also found to have and empyema at that time. He required a chest tube and was quite ill but made a full recovery. She'll medical history is also significant for paroxysmal atrial fibrillation, diabetes, hypertension, hyperlipidemia, hypothyroidism, AAA, carotid artery disease and chronic kidney disease.  He is on Eliquis for anticoagulation for atrial fibrillation and he is also on amiodarone. He gets the majority of his care from the [place] [facility] in [place].They follow his lipid profile.   Unfortunately, they do not supply fenofibrate, thus he is requesting a prescription for this. [facility].   Since his last OV, he reports that he has done well. He denies any CP. He has mild DOE with moderate activity, but denies any resting dyspnea. No LEE, orthopnea or PND. He denies syncope/ near syncope. He denies any symptoms of breakthrough atrial fibrillation. He reports full compliance with Eliquis. No abnormal bleeding or falls. [facility] and hepatic function for amiodarone monitoring.     Current Outpatient Prescriptions  Medication Sig Dispense Refill  . Insulin Detemir (LEVEMIR FLEXPEN) 100 UNIT/ML Pen Inject 46 Units into the skin 2 (two) times daily.    . insulin lispro (HUMALOG KWIKPEN) 100 UNIT/ML KiwkPen Inject 16 Units into the skin.    Marland Kitchen albuterol (PROVENTIL HFA;VENTOLIN HFA) 108 (90 BASE) MCG/ACT inhaler Inhale 2 puffs into the lungs every 6 (six) hours as needed for wheezing or shortness of breath.       . ALPRAZolam (XANAX) 1 MG tablet Take 1 mg by mouth as needed.   0  . amiodarone (PACERONE) 200 MG tablet TAKE 2 TABLETS (400 MG TOTAL) BY MOUTH DAILY. 60 tablet 2  . apixaban (ELIQUIS) 5 MG TABS tablet Take 5 mg by mouth 2 (two) times daily.    Marland Kitchen atorvastatin (LIPITOR) 40 MG tablet TAKE 1 TABLET BY MOUTH ONCE DAILY 30 tablet 3  . buPROPion (WELLBUTRIN XL) 150 MG 24 hr tablet Take 150 mg by mouth every morning.    . capsaicin (ZOSTRIX) 0.025 % cream Apply 1 application topically 2 (two) times daily as needed (for pain).     . Cholecalciferol 4000 UNITS CAPS Take 4,000 Units by mouth 3 (three) times daily.    . clotrimazole (LOTRIMIN) 1 % cream Apply 1 application topically 2 (two) times daily as needed (for rash).     . fenofibrate 54 MG tablet TAKE 1 TABLET BY MOUTH EVERY DAY 30 tablet 5  . ferrous sulfate dried (SLOW FE) 160 (50 FE) MG TBCR Take 160 mg by mouth 2 (two) times daily with a meal.      . gabapentin (NEURONTIN) 300 MG capsule Take 300 mg by mouth 2 times daily at 12 noon and 4 pm.      . glucose 4 GM chewable tablet Chew 1 tablet by mouth as needed for low blood sugar.    . hydrocortisone-pramoxine (ANALPRAM-HC) 2.5-1 % rectal cream Place 1 application rectally 3 (three) times daily as needed for hemorrhoids or itching.     . hydrOXYzine (VISTARIL)  25 MG capsule Take 25 mg by mouth as needed.   1  . insulin aspart (NOVOLOG) 100 UNIT/ML injection Inject 2-12 Units into the skin 3 (three) times daily before meals. Per sliding scale. 71-150 = 0 units, 151-200 = 2 units, 201-250 = 4 units, 251-300 = 6 units, 301-350 = 8 units, 351-400 = 10 units, if above 400 units = 12 units and call clinic.    . insulin detemir (LEVEMIR) 100 UNIT/ML injection Inject 47 Units into the skin 2 (two) times daily.     . isosorbide mononitrate (IMDUR) 30 MG 24 hr tablet Take 30 mg by mouth daily.    Marland Kitchen ketoconazole (NIZORAL) 2 % cream Apply 1 application topically daily. For rash    . levofloxacin (LEVAQUIN)  750 MG tablet Take 750 mg by mouth daily. For 10 days    . levothyroxine (SYNTHROID, LEVOTHROID) 100 MCG tablet Take 100 mcg by mouth daily before breakfast.     . LIDOCAINE EX Apply 1 application topically 2 (two) times daily as needed (for pain).     Marland Kitchen LINZESS 290 MCG CAPS capsule TAKE 1 CAPSULE (290 MCG TOTAL) BY MOUTH DAILY. 30 capsule 0  . Magnesium Oxide 420 MG TABS Take 420 mg by mouth daily.     . mupirocin ointment (BACTROBAN) 2 % Place 1 application into the nose daily as needed (for mersa).     . nitroGLYCERIN (NITROSTAT) 0.4 MG SL tablet Place 0.4 mg under the tongue every 5 (five) minutes as needed for chest pain (x 3 doses daily).     . nystatin cream (MYCOSTATIN) Apply 1 application topically 2 (two) times daily.    . Omega-3 Fatty Acids (FISH OIL) 1000 MG CAPS Take 3,000 mg by mouth 2 (two) times daily.     Marland Kitchen OVER THE COUNTER MEDICATION Apply 1 application topically as needed (for pain). "[facility] medicine, Theragesic"    . oxyCODONE-acetaminophen (PERCOCET) 10-325 MG per tablet Take 1 tablet by mouth every 4 (four) hours as needed for pain.    . pantoprazole (PROTONIX) 40 MG tablet Take 1 tablet (40 mg total) by mouth 2 (two) times daily. 60 tablet 2  . polyethylene glycol powder (GLYCOLAX/MIRALAX) powder DISSOLVE 17 GRAMS (1 CAPFUL) IN AT LEAST 8 OUNCES WATER/JUICE AND DRINK DAILY. 527 g 2  . psyllium (REGULOID) 0.52 G capsule Take 0.52 g by mouth daily.    . sucralfate (CARAFATE) 1 GM/10ML suspension Take 10 mLs (1 g total) by mouth 4 (four) times daily -  with meals and at bedtime. 420 mL 2  . tizanidine (ZANAFLEX) 2 MG capsule Take 2 mg by mouth 2 (two) times daily as needed for muscle spasms.     . traMADol (ULTRAM) 50 MG tablet Take 1 by mouth every 4-6 hours as needed for pain 20 tablet 0  . traZODone (DESYREL) 50 MG tablet Take 50 mg by mouth at bedtime.     No current facility-administered medications for this visit.    Allergies  Allergen Reactions  . Tetanus Toxoids  Hives    Social History   Social History  . Marital Status: Married    Spouse Name: N/A  . Number of Children: 4  . Years of Education: N/A   Occupational History  . CAR SERVICE    Social History Main Topics  . Smoking status: Never Smoker   . Smokeless tobacco: Never Used  . Alcohol Use: No  . Drug Use: Yes    Special: Cocaine  Comment: stopped cocaine 2 and 1/2 years ago  . Sexual Activity: Not on file   Other Topics Concern  . Not on file   Social History Narrative     Review of Systems: General: negative for chills, fever, night sweats or weight changes.  Cardiovascular: negative for chest pain, dyspnea on exertion, edema, orthopnea, palpitations, paroxysmal nocturnal dyspnea or shortness of breath Dermatological: negative for rash Respiratory: negative for cough or wheezing Urologic: negative for hematuria Abdominal: negative for nausea, vomiting, diarrhea, bright red blood per rectum, melena, or hematemesis Neurologic: negative for visual changes, syncope, or dizziness All other systems reviewed and are otherwise negative except as noted above.    Blood pressure 90/60, pulse 71, height 6' (1.829 m), weight 271 lb 12.8 oz (123.288 kg).  General appearance: alert, cooperative, no distress and moderately obese Neck: no JVD and right sided carotid bruit Lungs: clear to auscultation bilaterally Heart: regular rate and rhythm, S1, S2 normal, no murmur, click, rub or gallop Extremities: no LEE Pulses: 2+ and symmetric Skin: warm and dry Neurologic: Grossly normal  EKG NSR. Chronic RBBB and chronic LAFB. 71 bpm  ASSESSMENT AND PLAN:   1. CAD: s/p CABG in 2010. He denies any recent CP. EKG unchanged from prior EKG in 2015. Continue medical therapy.  2. HTN: BP is well controlled on current regimen. Lower level of normal at 90 mmHg systolic. However, he reports this is his baseline and denies any symptoms of hypotension.  3. HLD: followed at [facility],  however he needs Rx for fenofibrate. Will order 1 yr supply.  4. DM[facility]  5. Carotid Artery Disease: patient notes yearly f/u conducted at [place]. [place] this year showed 50% stenosis on the right. Asymptomatic w/o syncope/ near syncope.   6. AAA: [facility]. Denies tobacco use. BP is well controlled.   7. PAF: currently in NSR on amiodarone. Thyroid and hepatic function monitored at [facility]. He is on Eliquis for a/c and denies any abnormal bleeding or falls.    PLAN  Patient receives most of his care at [place] in [place]. He also sees a cardiologist there but wishes to continue yearly f/u at [facility] for fenofibrate. F/u in 1 year with Dr. Irish Lack.   Lyda Jester PA-C 05/12/2015 11:59 AM

## 2015-05-12 NOTE — Patient Instructions (Signed)
Medication Instructions:  Your physician recommends that you continue on your current medications as directed. Please refer to the Current Medication list given to you today.   Labwork: None ordered  Testing/Procedures: None ordered  Follow-Up: Your physician wants you to follow-up in: [place] DR. VARANASI.  You will receive a reminder letter in the mail two months in advance. If you don't receive a letter, please call our office to schedule the follow-up appointment.   Any Other Special Instructions Will Be Listed Below (If Applicable).     If you need a refill on your cardiac medications before your next appointment, please call your pharmacy.

## 2015-06-16 DIAGNOSIS — E1142 Type 2 diabetes mellitus with diabetic polyneuropathy: Secondary | ICD-10-CM | POA: Diagnosis not present

## 2015-06-16 DIAGNOSIS — M47817 Spondylosis without myelopathy or radiculopathy, lumbosacral region: Secondary | ICD-10-CM | POA: Diagnosis not present

## 2015-06-16 DIAGNOSIS — G894 Chronic pain syndrome: Secondary | ICD-10-CM | POA: Diagnosis not present

## 2015-06-16 DIAGNOSIS — M4806 Spinal stenosis, lumbar region: Secondary | ICD-10-CM | POA: Diagnosis not present

## 2015-07-13 DIAGNOSIS — M4806 Spinal stenosis, lumbar region: Secondary | ICD-10-CM | POA: Diagnosis not present

## 2015-07-13 DIAGNOSIS — M47817 Spondylosis without myelopathy or radiculopathy, lumbosacral region: Secondary | ICD-10-CM | POA: Diagnosis not present

## 2015-07-13 DIAGNOSIS — G894 Chronic pain syndrome: Secondary | ICD-10-CM | POA: Diagnosis not present

## 2015-07-13 DIAGNOSIS — E1142 Type 2 diabetes mellitus with diabetic polyneuropathy: Secondary | ICD-10-CM | POA: Diagnosis not present

## 2015-09-08 DIAGNOSIS — N183 Chronic kidney disease, stage 3 (moderate): Secondary | ICD-10-CM | POA: Diagnosis not present

## 2015-09-08 DIAGNOSIS — F329 Major depressive disorder, single episode, unspecified: Secondary | ICD-10-CM | POA: Diagnosis not present

## 2015-09-08 DIAGNOSIS — K589 Irritable bowel syndrome without diarrhea: Secondary | ICD-10-CM | POA: Diagnosis not present

## 2015-09-08 DIAGNOSIS — E119 Type 2 diabetes mellitus without complications: Secondary | ICD-10-CM | POA: Diagnosis not present

## 2015-09-08 DIAGNOSIS — E039 Hypothyroidism, unspecified: Secondary | ICD-10-CM | POA: Diagnosis not present

## 2015-09-08 DIAGNOSIS — F0631 Mood disorder due to known physiological condition with depressive features: Secondary | ICD-10-CM | POA: Diagnosis not present

## 2015-09-08 DIAGNOSIS — I714 Abdominal aortic aneurysm, without rupture: Secondary | ICD-10-CM | POA: Diagnosis not present

## 2015-09-08 DIAGNOSIS — I2510 Atherosclerotic heart disease of native coronary artery without angina pectoris: Secondary | ICD-10-CM | POA: Diagnosis not present

## 2015-09-08 DIAGNOSIS — E669 Obesity, unspecified: Secondary | ICD-10-CM | POA: Diagnosis not present

## 2015-09-08 DIAGNOSIS — E785 Hyperlipidemia, unspecified: Secondary | ICD-10-CM | POA: Diagnosis not present

## 2015-09-08 DIAGNOSIS — K5190 Ulcerative colitis, unspecified, without complications: Secondary | ICD-10-CM | POA: Diagnosis not present

## 2015-09-08 DIAGNOSIS — Z6838 Body mass index (BMI) 38.0-38.9, adult: Secondary | ICD-10-CM | POA: Diagnosis not present

## 2015-10-06 DIAGNOSIS — E1142 Type 2 diabetes mellitus with diabetic polyneuropathy: Secondary | ICD-10-CM | POA: Diagnosis not present

## 2015-10-06 DIAGNOSIS — G894 Chronic pain syndrome: Secondary | ICD-10-CM | POA: Diagnosis not present

## 2015-10-06 DIAGNOSIS — M4806 Spinal stenosis, lumbar region: Secondary | ICD-10-CM | POA: Diagnosis not present

## 2015-10-06 DIAGNOSIS — M47817 Spondylosis without myelopathy or radiculopathy, lumbosacral region: Secondary | ICD-10-CM | POA: Diagnosis not present

## 2015-11-15 DIAGNOSIS — H578 Other specified disorders of eye and adnexa: Secondary | ICD-10-CM | POA: Diagnosis not present

## 2015-11-15 DIAGNOSIS — R05 Cough: Secondary | ICD-10-CM | POA: Diagnosis not present

## 2015-11-17 DIAGNOSIS — G894 Chronic pain syndrome: Secondary | ICD-10-CM | POA: Diagnosis not present

## 2015-11-17 DIAGNOSIS — M47817 Spondylosis without myelopathy or radiculopathy, lumbosacral region: Secondary | ICD-10-CM | POA: Diagnosis not present

## 2015-11-17 DIAGNOSIS — M4806 Spinal stenosis, lumbar region: Secondary | ICD-10-CM | POA: Diagnosis not present

## 2015-11-17 DIAGNOSIS — E1142 Type 2 diabetes mellitus with diabetic polyneuropathy: Secondary | ICD-10-CM | POA: Diagnosis not present

## 2016-01-26 ENCOUNTER — Observation Stay (HOSPITAL_COMMUNITY)
Admission: EM | Admit: 2016-01-26 | Discharge: 2016-01-27 | DRG: 309 | Disposition: A | Discharge status: Home / Self Care | Payer: 59 | Attending: Interventional Cardiology | Admitting: Interventional Cardiology

## 2016-01-26 ENCOUNTER — Emergency Department (HOSPITAL_COMMUNITY): Payer: 59

## 2016-01-26 ENCOUNTER — Encounter (HOSPITAL_COMMUNITY): Payer: Self-pay | Admitting: Emergency Medicine

## 2016-01-26 DIAGNOSIS — Z951 Presence of aortocoronary bypass graft: Secondary | ICD-10-CM | POA: Diagnosis not present

## 2016-01-26 DIAGNOSIS — E669 Obesity, unspecified: Secondary | ICD-10-CM | POA: Diagnosis not present

## 2016-01-26 DIAGNOSIS — Z85528 Personal history of other malignant neoplasm of kidney: Secondary | ICD-10-CM | POA: Diagnosis not present

## 2016-01-26 DIAGNOSIS — I119 Hypertensive heart disease without heart failure: Secondary | ICD-10-CM | POA: Diagnosis not present

## 2016-01-26 DIAGNOSIS — N400 Enlarged prostate without lower urinary tract symptoms: Secondary | ICD-10-CM | POA: Diagnosis not present

## 2016-01-26 DIAGNOSIS — E039 Hypothyroidism, unspecified: Secondary | ICD-10-CM | POA: Diagnosis present

## 2016-01-26 DIAGNOSIS — I4891 Unspecified atrial fibrillation: Secondary | ICD-10-CM | POA: Diagnosis not present

## 2016-01-26 DIAGNOSIS — Z6836 Body mass index (BMI) 36.0-36.9, adult: Secondary | ICD-10-CM | POA: Diagnosis not present

## 2016-01-26 DIAGNOSIS — Z79899 Other long term (current) drug therapy: Secondary | ICD-10-CM | POA: Diagnosis not present

## 2016-01-26 DIAGNOSIS — Z905 Acquired absence of kidney: Secondary | ICD-10-CM

## 2016-01-26 DIAGNOSIS — Z8249 Family history of ischemic heart disease and other diseases of the circulatory system: Secondary | ICD-10-CM | POA: Diagnosis not present

## 2016-01-26 DIAGNOSIS — E785 Hyperlipidemia, unspecified: Secondary | ICD-10-CM | POA: Diagnosis present

## 2016-01-26 DIAGNOSIS — Z9114 Patient's other noncompliance with medication regimen: Secondary | ICD-10-CM

## 2016-01-26 DIAGNOSIS — N183 Chronic kidney disease, stage 3 (moderate): Secondary | ICD-10-CM | POA: Diagnosis present

## 2016-01-26 DIAGNOSIS — Z85118 Personal history of other malignant neoplasm of bronchus and lung: Secondary | ICD-10-CM

## 2016-01-26 DIAGNOSIS — I25110 Atherosclerotic heart disease of native coronary artery with unstable angina pectoris: Secondary | ICD-10-CM | POA: Diagnosis present

## 2016-01-26 DIAGNOSIS — I480 Paroxysmal atrial fibrillation: Principal | ICD-10-CM | POA: Diagnosis present

## 2016-01-26 DIAGNOSIS — Z794 Long term (current) use of insulin: Secondary | ICD-10-CM | POA: Diagnosis not present

## 2016-01-26 DIAGNOSIS — K760 Fatty (change of) liver, not elsewhere classified: Secondary | ICD-10-CM | POA: Diagnosis not present

## 2016-01-26 DIAGNOSIS — I4510 Unspecified right bundle-branch block: Secondary | ICD-10-CM | POA: Diagnosis not present

## 2016-01-26 DIAGNOSIS — I714 Abdominal aortic aneurysm, without rupture: Secondary | ICD-10-CM | POA: Diagnosis present

## 2016-01-26 DIAGNOSIS — I129 Hypertensive chronic kidney disease with stage 1 through stage 4 chronic kidney disease, or unspecified chronic kidney disease: Secondary | ICD-10-CM | POA: Diagnosis present

## 2016-01-26 DIAGNOSIS — Z7901 Long term (current) use of anticoagulants: Secondary | ICD-10-CM

## 2016-01-26 DIAGNOSIS — R079 Chest pain, unspecified: Secondary | ICD-10-CM | POA: Diagnosis not present

## 2016-01-26 DIAGNOSIS — I200 Unstable angina: Secondary | ICD-10-CM | POA: Diagnosis not present

## 2016-01-26 DIAGNOSIS — E1122 Type 2 diabetes mellitus with diabetic chronic kidney disease: Secondary | ICD-10-CM | POA: Diagnosis not present

## 2016-01-26 LAB — I-STAT CHEM 8, ED
BUN: 34 mg/dL — AB (ref 6–20)
CHLORIDE: 101 mmol/L (ref 101–111)
CREATININE: 2 mg/dL — AB (ref 0.61–1.24)
Calcium, Ion: 1.22 mmol/L (ref 1.12–1.23)
GLUCOSE: 432 mg/dL — AB (ref 65–99)
HEMATOCRIT: 45 % (ref 39.0–52.0)
Hemoglobin: 15.3 g/dL (ref 13.0–17.0)
POTASSIUM: 4.3 mmol/L (ref 3.5–5.1)
Sodium: 135 mmol/L (ref 135–145)
TCO2: 24 mmol/L (ref 0–100)

## 2016-01-26 LAB — MAGNESIUM: MAGNESIUM: 1.8 mg/dL (ref 1.7–2.4)

## 2016-01-26 LAB — COMPREHENSIVE METABOLIC PANEL
ALK PHOS: 33 U/L — AB (ref 38–126)
ALT: 35 U/L (ref 17–63)
AST: 26 U/L (ref 15–41)
Albumin: 3.8 g/dL (ref 3.5–5.0)
Anion gap: 8 (ref 5–15)
BUN: 30 mg/dL — AB (ref 6–20)
CALCIUM: 9.1 mg/dL (ref 8.9–10.3)
CHLORIDE: 101 mmol/L (ref 101–111)
CO2: 23 mmol/L (ref 22–32)
CREATININE: 1.97 mg/dL — AB (ref 0.61–1.24)
GFR calc non Af Amer: 32 mL/min — ABNORMAL LOW (ref >60)
GFR, EST AFRICAN AMERICAN: 37 mL/min — AB (ref >60)
Glucose, Bld: 420 mg/dL — ABNORMAL HIGH (ref 65–99)
Potassium: 4.3 mmol/L (ref 3.5–5.1)
SODIUM: 132 mmol/L — AB (ref 135–145)
Total Bilirubin: 0.5 mg/dL (ref 0.3–1.2)
Total Protein: 7.3 g/dL (ref 6.5–8.1)

## 2016-01-26 LAB — GLUCOSE, CAPILLARY: GLUCOSE-CAPILLARY: 223 mg/dL — AB (ref 65–99)

## 2016-01-26 LAB — PROTIME-INR
INR: 1
Prothrombin Time: 13.2 seconds (ref 11.4–15.2)

## 2016-01-26 LAB — HEPARIN LEVEL (UNFRACTIONATED): HEPARIN UNFRACTIONATED: 0.51 [IU]/mL (ref 0.30–0.70)

## 2016-01-26 LAB — BRAIN NATRIURETIC PEPTIDE: B Natriuretic Peptide: 215.8 pg/mL — ABNORMAL HIGH (ref 0.0–100.0)

## 2016-01-26 LAB — APTT: APTT: 43 s — AB (ref 24–36)

## 2016-01-26 LAB — I-STAT TROPONIN, ED: Troponin i, poc: 0 ng/mL (ref 0.00–0.08)

## 2016-01-26 MED ORDER — FENOFIBRATE 54 MG PO TABS
54.0000 mg | ORAL_TABLET | Freq: Every day | ORAL | Status: DC
  Administered 2016-01-27: 54 mg via ORAL
  Filled 2016-01-26: qty 1

## 2016-01-26 MED ORDER — GABAPENTIN 300 MG PO CAPS
300.0000 mg | ORAL_CAPSULE | Freq: Two times a day (BID) | ORAL | Status: DC
  Administered 2016-01-27: 300 mg via ORAL
  Filled 2016-01-26: qty 1

## 2016-01-26 MED ORDER — INSULIN ASPART PROT & ASPART (70-30 MIX) 100 UNIT/ML ~~LOC~~ SUSP
15.0000 [IU] | Freq: Once | SUBCUTANEOUS | Status: AC
  Administered 2016-01-26: 15 [IU] via SUBCUTANEOUS
  Filled 2016-01-26: qty 10

## 2016-01-26 MED ORDER — AMIODARONE IV BOLUS ONLY 150 MG/100ML
150.0000 mg | Freq: Once | INTRAVENOUS | Status: AC
  Administered 2016-01-26: 150 mg via INTRAVENOUS
  Filled 2016-01-26: qty 100

## 2016-01-26 MED ORDER — AMIODARONE HCL IN DEXTROSE 360-4.14 MG/200ML-% IV SOLN
30.0000 mg/h | INTRAVENOUS | Status: DC
  Administered 2016-01-27 (×2): 30 mg/h via INTRAVENOUS
  Filled 2016-01-26 (×2): qty 200

## 2016-01-26 MED ORDER — AMIODARONE HCL IN DEXTROSE 360-4.14 MG/200ML-% IV SOLN
30.0000 mg/h | INTRAVENOUS | Status: DC
  Administered 2016-01-26 (×2): 30 mg/h via INTRAVENOUS
  Filled 2016-01-26: qty 200

## 2016-01-26 MED ORDER — DEXTROSE 5 % IV SOLN: 30.0000 mg/h | Freq: Once | INTRAVENOUS | Status: DC

## 2016-01-26 MED ORDER — INSULIN DETEMIR 100 UNIT/ML ~~LOC~~ SOLN
46.0000 [IU] | Freq: Two times a day (BID) | SUBCUTANEOUS | Status: DC
  Administered 2016-01-27 (×2): 46 [IU] via SUBCUTANEOUS
  Filled 2016-01-26 (×3): qty 0.46

## 2016-01-26 MED ORDER — NITROGLYCERIN 0.4 MG SL SUBL: 0.4000 mg | SUBLINGUAL_TABLET | SUBLINGUAL | Status: DC | PRN

## 2016-01-26 MED ORDER — BUPROPION HCL ER (XL) 150 MG PO TB24
150.0000 mg | ORAL_TABLET | Freq: Every morning | ORAL | Status: DC
  Administered 2016-01-27: 150 mg via ORAL
  Filled 2016-01-26: qty 1

## 2016-01-26 MED ORDER — HEPARIN (PORCINE) IN NACL 100-0.45 UNIT/ML-% IJ SOLN
1500.0000 [IU]/h | INTRAMUSCULAR | Status: DC
  Administered 2016-01-26: 1300 [IU]/h via INTRAVENOUS
  Filled 2016-01-26: qty 250

## 2016-01-26 MED ORDER — SODIUM CHLORIDE 0.9 % IV SOLN
Freq: Once | INTRAVENOUS | Status: AC
  Administered 2016-01-26: 16:00:00 via INTRAVENOUS

## 2016-01-26 MED ORDER — HYDROXYZINE HCL 25 MG PO TABS
50.0000 mg | ORAL_TABLET | Freq: Every day | ORAL | Status: DC
  Administered 2016-01-27: 50 mg via ORAL
  Filled 2016-01-26: qty 2

## 2016-01-26 MED ORDER — OXYCODONE-ACETAMINOPHEN 5-325 MG PO TABS: 1.0000 | ORAL_TABLET | ORAL | Status: DC | PRN

## 2016-01-26 MED ORDER — ISOSORBIDE MONONITRATE ER 30 MG PO TB24
30.0000 mg | ORAL_TABLET | Freq: Every day | ORAL | Status: DC
Start: 2016-01-27 — End: 2016-01-27
  Administered 2016-01-27: 30 mg via ORAL
  Filled 2016-01-26: qty 1

## 2016-01-26 MED ORDER — VITAMIN D 1000 UNITS PO TABS
4000.0000 [IU] | ORAL_TABLET | Freq: Three times a day (TID) | ORAL | Status: DC
  Administered 2016-01-27: 4000 [IU] via ORAL
  Filled 2016-01-26: qty 4

## 2016-01-26 MED ORDER — ACETAMINOPHEN 325 MG PO TABS: 650.0000 mg | ORAL_TABLET | ORAL | Status: DC | PRN

## 2016-01-26 MED ORDER — SODIUM CHLORIDE 0.9 % IV BOLUS (SEPSIS)
250.0000 mL | Freq: Once | INTRAVENOUS | Status: AC
Start: 2016-01-26 — End: 2016-01-26
  Administered 2016-01-26: 250 mL via INTRAVENOUS

## 2016-01-26 MED ORDER — ONDANSETRON HCL 4 MG/2ML IJ SOLN: 4.0000 mg | Freq: Four times a day (QID) | INTRAMUSCULAR | Status: DC | PRN

## 2016-01-26 MED ORDER — INSULIN ASPART 100 UNIT/ML ~~LOC~~ SOLN
16.0000 [IU] | Freq: Three times a day (TID) | SUBCUTANEOUS | Status: DC
  Administered 2016-01-27 (×2): 16 [IU] via SUBCUTANEOUS

## 2016-01-26 MED ORDER — ATORVASTATIN CALCIUM 80 MG PO TABS: 80.0000 mg | ORAL_TABLET | Freq: Every day | ORAL | Status: DC

## 2016-01-26 MED ORDER — AMIODARONE HCL IN DEXTROSE 360-4.14 MG/200ML-% IV SOLN
60.0000 mg/h | INTRAVENOUS | Status: AC
  Administered 2016-01-26 – 2016-01-27 (×2): 60 mg/h via INTRAVENOUS

## 2016-01-26 MED ORDER — OXYCODONE-ACETAMINOPHEN 10-325 MG PO TABS: 1.0000 | ORAL_TABLET | ORAL | Status: DC | PRN

## 2016-01-26 MED ORDER — SODIUM CHLORIDE 0.9 % IV SOLN
INTRAVENOUS | Status: DC
  Administered 2016-01-26: 21:00:00 via INTRAVENOUS

## 2016-01-26 MED ORDER — PANTOPRAZOLE SODIUM 40 MG PO TBEC
40.0000 mg | DELAYED_RELEASE_TABLET | Freq: Two times a day (BID) | ORAL | Status: DC
  Administered 2016-01-27: 40 mg via ORAL
  Filled 2016-01-26: qty 1

## 2016-01-26 MED ORDER — FERROUS SULFATE 325 (65 FE) MG PO TABS
325.0000 mg | ORAL_TABLET | Freq: Every day | ORAL | Status: DC
  Administered 2016-01-27: 325 mg via ORAL
  Filled 2016-01-26: qty 1

## 2016-01-26 MED ORDER — CETYLPYRIDINIUM CHLORIDE 0.05 % MT LIQD
7.0000 mL | Freq: Two times a day (BID) | OROMUCOSAL | Status: DC
  Administered 2016-01-26: 7 mL via OROMUCOSAL

## 2016-01-26 MED ORDER — OXYCODONE HCL 5 MG PO TABS: 5.0000 mg | ORAL_TABLET | ORAL | Status: DC | PRN

## 2016-01-26 MED ORDER — KETOCONAZOLE 2 % EX CREA
1.0000 "application " | TOPICAL_CREAM | Freq: Every day | CUTANEOUS | Status: DC
  Administered 2016-01-27: 1 via TOPICAL
  Filled 2016-01-26 (×2): qty 15

## 2016-01-26 MED ORDER — MAGNESIUM OXIDE 400 (241.3 MG) MG PO TABS
420.0000 mg | ORAL_TABLET | Freq: Every day | ORAL | Status: DC
  Administered 2016-01-27: 400 mg via ORAL
  Filled 2016-01-26: qty 1

## 2016-01-26 MED ORDER — LEVOTHYROXINE SODIUM 100 MCG PO TABS
100.0000 ug | ORAL_TABLET | Freq: Every day | ORAL | Status: DC
  Administered 2016-01-27: 100 ug via ORAL
  Filled 2016-01-26: qty 1

## 2016-01-26 NOTE — ED Provider Notes (Signed)
[place] DEPT Provider Note   CSN: [id number] Arrival date & time: 01/26/16  1518     History   Chief Complaint Chief Complaint  Patient presents with  . Chest Pain    HPI Matthew [name] is a 70 y.o. male.  HPI Patient reports he started developing chest pain today that became severe. It was a heavy pressure on the left side of his chest. It was moderate around lunch time, he reports it got much worse. He tried 2 nitroglycerin without relief and then he tried to more. At that time with no relief and worsening chest pressure he came to the emergency department. The patient reports he does get chest pain intermittently and it usually resolves with nitroglycerin. The last time he took nitroglycerin before today was 2 days ago. He did not have any syncope. He reports no diaphoresis but some associated shortness of breath. Patient has had multiple cardiac procedures and an stents. Patient reports he did miss most of his medications today. He did not take his amiodarone or his Eliquis. He does report that he took them yesterday. Patient states he was feeling well before this started. He has not been sick recently he has not had fevers, chills. No coughing no peripheral swelling. Past Medical History:  Diagnosis Date  . AAA (abdominal aortic aneurysm) ([place])    a. Aortic US 05/2012: 3.9 cm fusiform abdominal aortic aneurysm. F/u recommended 05/2014.  Marland Kitchen Anemia   . Atrial fibrillation ([place])    a. Post-op CABG 05/2009. b. Re-identified 12/2013.  Marland Kitchen CAD (coronary artery disease)    a. s/p CABGx4 05/2009.  Marland Kitchen Chronic constipation   . CKD (chronic kidney disease), stage III   . Cocaine abuse in remission   . Colon polyps   . Diabetes mellitus   . Empyema lung ([place])    a. PNA 02/2011 c/b empyema requiring chest tube, drainage of pleural effusion and decortication.  . Enlarged prostate   . Hepatic steatosis   . Hyperlipidemia   . Hypertension   . Hypothyroidism   . Iron deficiency  anemia   . LAFB (left anterior fascicular block)   . Lung cancer ([place])    a. Tx with interleukin in 2002 per patient.  . Nodule of left lung    a. Cavitary nodules 02/2011, f/u recommended.  . Obesity   . Paroxysmal supraventricular tachycardia ([place])    a. While inpatient 02/2011 sick with empyema - wide appearace due to RBBB.  . RBBB   . Renal cell carcinoma    a. s/p R nephrectomy 2002. Reported pulmonary mets per note at that time.  . Scrotal abscess    a. 2012 2/2 Streptococcus group C, status post incision and drainage.  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  . Vitamin D deficiency     Patient Active Problem List   Diagnosis Date Noted  . Atrial fibrillation with RVR ([place]) 01/26/2016  . PAF (paroxysmal atrial fibrillation) ([place]) 12/21/2013  . Chronic abdominal pain 12/21/2013  . CKD (chronic kidney disease), stage III 12/21/2013  . H/O: lung cancer 12/21/2013  . LV dysfunction 12/21/2013  . RBBB   . LAFB (left anterior fascicular block)   . AAA (abdominal aortic aneurysm) ([place])   . Chest pain 12/20/2013  . Mixed hyperlipidemia 10/14/2013  . Obesity, unspecified 10/14/2013  . Right Empyema    .  Left upper lobe lesion or nodule   . Hypertension   . CAD (  coronary artery disease)   . Renal cell carcinoma ([place])   . [place]   . Paroxysmal supraventricular tachycardia ([place])   . Hypothyroidism     Past Surgical History:  Procedure Laterality Date  . Bronchoscopy, right video-assisted thoracoscopy, minithoracotomy, drainage of pleural effusion, and decortication.  02/26/2011   [facility]  . CABG x 4  05/13/2009   [place]  . HEMORRHOID SURGERY    . HERNIA REPAIR    . Incision, drainage and debridement of perineum and  scrotum    . Irrigation and debridement of scrotal wound and closure of scrotal wound.    . Right radical nephrectomy.    . TOOTH EXTRACTION         Home Medications    Prior to Admission medications   Medication Sig  Start Date End Date Taking? Authorizing Provider  albuterol (PROVENTIL HFA;VENTOLIN HFA) 108 (90 BASE) MCG/ACT inhaler Inhale 2 puffs into the lungs every 6 (six) hours as needed for wheezing or shortness of breath.    Yes Historical Provider, MD  amiodarone (PACERONE) 200 MG tablet TAKE 2 TABLETS (400 MG TOTAL) BY MOUTH DAILY. 11/11/14  Yes Jettie Booze, MD  apixaban (ELIQUIS) 5 MG TABS tablet Take 5 mg by mouth 2 (two) times daily.   Yes Historical Provider, MD  atorvastatin (LIPITOR) 80 MG tablet Take 80 mg by mouth daily at 6 PM.   Yes Historical Provider, MD  buPROPion (WELLBUTRIN XL) 150 MG 24 hr tablet Take 150 mg by mouth every morning.   Yes Historical Provider, MD  capsaicin (ZOSTRIX) 0.025 % cream Apply 1 application topically 2 (two) times daily as needed (for pain).    Yes Historical Provider, MD  Cholecalciferol 4000 UNITS CAPS Take 4,000 Units by mouth 3 (three) times daily.   Yes Historical Provider, MD  clotrimazole (LOTRIMIN) 1 % cream Apply 1 application topically 2 (two) times daily as needed (for rash).    Yes Historical Provider, MD  fenofibrate 54 MG tablet Take 1 tablet (54 mg total) by mouth daily. 05/12/15  Yes Brittainy Erie Noe, PA-C  ferrous sulfate dried (SLOW FE) 160 (50 FE) MG TBCR Take 160 mg by mouth 2 (two) times daily with a meal.     Yes Historical Provider, MD  gabapentin (NEURONTIN) 300 MG capsule Take 300 mg by mouth 2 times daily at 12 noon and 4 pm.     Yes Historical Provider, MD  glucose 4 GM chewable tablet Chew 1 tablet by mouth as needed for low blood sugar.   Yes Historical Provider, MD  hydrOXYzine (VISTARIL) 25 MG capsule Take 50 mg by mouth at bedtime.  10/10/14  Yes Historical Provider, MD  Insulin Detemir (LEVEMIR FLEXPEN) 100 UNIT/ML Pen Inject 46 Units into the skin 2 (two) times daily.   Yes Historical Provider, MD  isosorbide mononitrate (IMDUR) 30 MG 24 hr tablet Take 30 mg by mouth daily.   Yes Historical Provider, MD  ketoconazole  (NIZORAL) 2 % cream Apply 1 application topically daily. For rash   Yes Historical Provider, MD  levothyroxine (SYNTHROID, LEVOTHROID) 100 MCG tablet Take 100 mcg by mouth daily before breakfast.    Yes Historical Provider, MD  Linaclotide (LINZESS) 145 MCG CAPS capsule Take 145 mcg by mouth daily as needed (diarrhea).    Yes Historical Provider, MD  Magnesium Oxide 420 MG TABS Take 420 mg by mouth daily.    Yes Historical Provider, MD  mupirocin ointment (BACTROBAN) 2 % Place 1 application into the nose  daily as needed (for mersa).    Yes Historical Provider, MD  nystatin cream (MYCOSTATIN) Apply 1 application topically 2 (two) times daily.   Yes Historical Provider, MD  Omega-3 Fatty Acids (FISH OIL) 1000 MG CAPS Take 1,000 mg by mouth daily.    Yes Historical Provider, MD  OVER THE COUNTER MEDICATION Apply 1 application topically as needed (for pain). "VA medicine, Theragesic"   Yes Historical Provider, MD  oxyCODONE-acetaminophen (PERCOCET) 10-325 MG per tablet Take 1 tablet by mouth every 4 (four) hours as needed for pain.   Yes Historical Provider, MD  pantoprazole (PROTONIX) 40 MG tablet Take 1 tablet (40 mg total) by mouth 2 (two) times daily. 10/20/14  Yes Jerene Bears, MD  cyclobenzaprine (FLEXERIL) 5 MG tablet Take 5 mg by mouth 3 (three) times daily as needed. 11/17/15   Historical Provider, MD  hydrocortisone-pramoxine [facility]) 2.5-1 % rectal cream Place 1 application rectally 3 (three) times daily as needed for hemorrhoids or itching.     Historical Provider, MD  insulin lispro (HUMALOG KWIKPEN) 100 UNIT/ML KiwkPen Inject 16 Units into the skin 3 (three) times daily.     Historical Provider, MD  levofloxacin (LEVAQUIN) 750 MG tablet Take 750 mg by mouth daily. For 10 days 03/06/14   Historical Provider, MD  LIDOCAINE EX Apply 1 application topically 2 (two) times daily as needed (for pain).     Historical Provider, MD  nitroGLYCERIN (NITROSTAT) 0.4 MG SL tablet Place 0.4 mg under the  tongue every 5 (five) minutes as needed for chest pain (x 3 doses daily).     Historical Provider, MD  polyethylene glycol powder (GLYCOLAX/MIRALAX) powder DISSOLVE 17 GRAMS (1 CAPFUL) IN AT LEAST 8 OUNCES WATER/JUICE AND DRINK DAILY. 10/13/14   Jerene Bears, MD  psyllium (REGULOID) 0.52 G capsule Take 0.52 g by mouth daily as needed.     Historical Provider, MD  tizanidine (ZANAFLEX) 2 MG capsule Take 2 mg by mouth 2 (two) times daily as needed for muscle spasms.     Historical Provider, MD    Family History Family History  Problem Relation Age of Onset  . Alzheimer's disease Mother   . Heart disease Father   . Pneumonia    . Colon cancer Neg Hx   . Esophageal cancer Neg Hx   . Stomach cancer Neg Hx   . Rectal cancer Neg Hx     Social History Social History  Substance Use Topics  . Smoking status: Never Smoker  . Smokeless tobacco: Never Used  . Alcohol use No     Allergies   Tetanus toxoids   Review of Systems Review of Systems 10 Systems reviewed and are negative for acute change except as noted in the HPI.  Physical Exam Updated Vital Signs BP 133/74   Pulse 75   Resp 14   Ht 6' 1"  (1.854 m)   Wt 280 lb (127 kg)   SpO2 99%   BMI 36.94 kg/m   Physical Exam  Constitutional: He is oriented to person, place, and time.  Patient is alert. He is mildly anxious in appearance. Mental status is clear. Mild tachypnea.  HENT:  Head: Normocephalic and atraumatic.  Eyes: EOM are normal.  Neck: Neck supple.  Cardiovascular:  Tachycardia. Irregularly irregular. Cannot appreciate rub murmur gallop at this time.  Pulmonary/Chest:  Mild tachypnea. Lungs otherwise clear with no wheeze rhonchi rail.  Abdominal: Soft. He exhibits no distension. There is no tenderness. There is no guarding.  Musculoskeletal:  Normal range of motion. He exhibits no edema or tenderness.  Neurological: He is alert and oriented to person, place, and time. He exhibits normal muscle tone. Coordination  normal.  Skin: Skin is warm and dry.  Psychiatric: He has a normal mood and affect.     ED Treatments / Results  Labs (all labs ordered are listed, but only abnormal results are displayed) Labs Reviewed  COMPREHENSIVE METABOLIC PANEL - Abnormal; Notable for the following:       Result Value   Sodium 132 (*)    Glucose, Bld 420 (*)    BUN 30 (*)    Creatinine, Ser 1.97 (*)    Alkaline Phosphatase 33 (*)    GFR calc non Af Amer 32 (*)    GFR calc Af Amer 37 (*)    All other components within normal limits  BRAIN NATRIURETIC PEPTIDE - Abnormal; Notable for the following:    B Natriuretic Peptide 215.8 (*)    All other components within normal limits  APTT - Abnormal; Notable for the following:    aPTT 43 (*)    All other components within normal limits  I-STAT CHEM 8, ED - Abnormal; Notable for the following:    BUN 34 (*)    Creatinine, Ser 2.00 (*)    Glucose, Bld 432 (*)    All other components within normal limits  PROTIME-INR  HEPARIN LEVEL (UNFRACTIONATED)  MAGNESIUM  I-STAT TROPOININ, ED    EKG  EKG Interpretation  Date/Time:  Thursday January 26 2016 15:23:55 EDT Ventricular Rate:  152 PR Interval:    QRS Duration: 128 QT Interval:  291 QTC Calculation: 463 R Axis:   -86 Text Interpretation:  Wide-QRS tachycardia Right bundle branch block old right bundle. rate related ST elevation. does not appear to have STEMI criteria Confirmed by Johnney Killian, MD, Jeannie Done [phone]) on 01/26/2016 3:28:27 PM       Radiology Dg Chest Port 1 View  Result Date: 01/26/2016 CLINICAL DATA:  Chest pain, LEFT chest pain and tightness with LEFT arm numbness beginning 15 minutes ago, coronary artery disease post multiple MI and CABG, hypertension, lung cancer, atrial fibrillation, emphysema EXAM: PORTABLE CHEST 1 VIEW COMPARISON:  Portable exam 1543 hours compared to 03/08/2014 FINDINGS: Upper normal heart size post CABG. Mediastinal contours and pulmonary vascularity normal. Rotated to the  RIGHT. Subsegmental atelectasis at lung bases. Minimal central peribronchial thickening. No infiltrate, pleural effusion or pneumothorax. IMPRESSION: Post CABG. Minimal bronchitic changes and bibasilar atelectasis. Electronically Signed   By: Lavonia Dana M.D.   On: 01/26/2016 15:59    Procedures Procedures (including critical care time) CRITICAL CARE Performed by: Charlesetta Shanks   Total critical care time: 45 minutes  Critical care time was exclusive of separately billable procedures and treating other patients.  Critical care was necessary to treat or prevent imminent or life-threatening deterioration.  Critical care was time spent personally by me on the following activities: development of treatment plan with patient and/or surrogate as well as nursing, discussions with consultants, evaluation of patient's response to treatment, examination of patient, obtaining history from patient or surrogate, ordering and performing treatments and interventions, ordering and review of laboratory studies, ordering and review of radiographic studies, pulse oximetry and re-evaluation of patient's condition.  Medications Ordered in ED Medications  amiodarone (NEXTERONE PREMIX) 360-4.14 MG/200ML-% (1.8 mg/mL) IV infusion (30 mg/hr Intravenous New Bag/Given 01/26/16 1544)  insulin aspart protamine- aspart (NOVOLOG MIX 70/30) injection 15 Units (not administered)  amiodarone (NEXTERONE) IV bolus only  150 mg/100 mL (0 mg Intravenous Stopped 01/26/16 1609)  sodium chloride 0.9 % bolus 250 mL (0 mLs Intravenous Stopped 01/26/16 1600)  0.9 %  sodium chloride infusion ( Intravenous New Bag/Given 01/26/16 1606)     Initial Impression / Assessment and Plan / ED Course  I have reviewed the triage vital signs and the nursing notes.  Pertinent labs & imaging results that were available during my care of the patient were reviewed by me and considered in my medical decision making (see chart for details).  Clinical  Course   16:31 patient reports chest pain resolved. Amiodarone bolus is complete, hourly drip running. HR 130s. BP stable.   Consult: 16:25 Dr. Claiborne Billings cardiology. Will [facility] for tachycardia with amiodarone drip and suspicion of ACS with extensive cardiac history.  Final Clinical Impressions(s) / ED Diagnoses   Final diagnoses:  Unstable angina (HCC)  Atrial fibrillation with RVR ([place])   Patient present with severe chest pain unrelieved by nitroglycerin. He also had atrial fibrillation with rapid ventricular response. Patient had old right bundle branch block. Patient had missed his dose today of amiodarone and Eliquis. Treatment was initiated with amiodarone bolus and drip as well as heparin bolus for ACS. Initially the patient continued to have chest discomfort rating it at a 2/10 down from 8\10. Around 16:30 patient reported chest pain to be resolved. Plan is to transfer the patient to [facility] for cardiology management of hypotension relation with rapid ventricular response and chest pain with concern for unstable angina with severe coronary artery disease.  New Prescriptions New Prescriptions   No medications on file     Charlesetta Shanks, MD 01/26/16 1655

## 2016-01-26 NOTE — Progress Notes (Signed)
ANTICOAGULATION CONSULT NOTE - Initial Consult  Pharmacy Consult for Heparin Indication: chest pain/ACS  Allergies  Allergen Reactions  . Tetanus Toxoids Hives    Patient Measurements: Height: 6' 1"  (185.4 cm) Weight: 280 lb (127 kg) IBW/kg (Calculated) : 79.9 Heparin Dosing Weight: 108 kg  Vital Signs: BP: 117/66 (08/17 1730) Pulse Rate: 66 (08/17 1730)  Labs:  Recent Labs  01/26/16 1533 01/26/16 1538 01/26/16 1544  HGB  --   --  15.3  HCT  --   --  45.0  APTT 43*  --   --   LABPROT 13.2  --   --   INR 1.00  --   --   HEPARINUNFRC 0.51  --   --   CREATININE  --  1.97* 2.00*    Estimated Creatinine Clearance: 46.6 mL/min (by C-G formula based on SCr of 2 mg/dL).   Medical History: Past Medical History:  Diagnosis Date  . AAA (abdominal aortic aneurysm) ([place])    a. Aortic US 05/2012: 3.9 cm fusiform abdominal aortic aneurysm. F/u recommended 05/2014.  Marland Kitchen Anemia   . Atrial fibrillation ([place])    a. Post-op CABG 05/2009. b. Re-identified 12/2013.  Marland Kitchen CAD (coronary artery disease)    a. s/p CABGx4 05/2009.  Marland Kitchen Chronic constipation   . CKD (chronic kidney disease), stage III   . Cocaine abuse in remission   . Colon polyps   . Diabetes mellitus   . Empyema lung ([place])    a. PNA 02/2011 c/b empyema requiring chest tube, drainage of pleural effusion and decortication.  . Enlarged prostate   . Hepatic steatosis   . Hyperlipidemia   . Hypertension   . Hypothyroidism   . Iron deficiency anemia   . LAFB (left anterior fascicular block)   . Lung cancer ([place])    a. Tx with interleukin in 2002 per patient.  . Nodule of left lung    a. Cavitary nodules 02/2011, f/u recommended.  . Obesity   . Paroxysmal supraventricular tachycardia ([place])    a. While inpatient 02/2011 sick with empyema - wide appearace due to RBBB.  . RBBB   . Renal cell carcinoma    a. s/p R nephrectomy 2002. Reported pulmonary mets per note at that time.  . Scrotal abscess    a. 2012 2/2  Streptococcus group C, status post incision and drainage.  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  . Vitamin D deficiency     Medications:  Scheduled:   Infusions:  . amiodarone    . amiodarone      Assessment: 61 yoM presents to ED on 8/17 with chest pain, tightness, and left arm numbness.  PMH significant cardiac dz including CAD s/p CABG, MI, AFib on chronic apixaban anticoagulation, HTN, CKD-3.  Pharmacy is consulted to dose Heparin.  PTA Apixaban 81m PO BID.  LD 8/16 - exact time unknown per patient.  Today, 01/26/2016:  Baseline coags: INR 1, aPTT 43, Heparin level 0.5  SCr elevated at 2, CrCl ~ 46 ml/min  CBC: Hgb 15.3  Troponin 0.0  No bleeding or complications reported.     Goal of Therapy:  Heparin level 0.3-0.7 units/ml aPTT 66-102 seconds Monitor platelets by anticoagulation protocol: Yes   Plan:   No heparin bolus d/t recent apixaban use.  Start heparin IV infusion at 1300 units/hr at 1700 (>12 hrs after last apixaban dose)  Heparin level and aPTT 8 hours after starting  Daily heparin level,  aPTT, and CBC  Continue to monitor H&H and platelets  Gretta Arab PharmD, BCPS Pager [phone] 01/26/2016 3:38 PM   Addendum:  Planning transfer to [facility] for cardiology management.

## 2016-01-26 NOTE — Progress Notes (Signed)
Patient listed as having Medicare insurance without a pcp.  EDCM spoke to patient at bedside.  Patient reports his pcp is listed at the [facility] in [place].  System updated.

## 2016-01-26 NOTE — ED Notes (Signed)
Pacer pads applied

## 2016-01-26 NOTE — ED Notes (Signed)
MD at bedside. 

## 2016-01-26 NOTE — ED Triage Notes (Addendum)
Pt with hx of 6 coronary artery bipass grafts and multiple MIs c/o left side chest pain and tightness with left arm numbness onset 15 minutes ago. Took 4 nitroglycerine tablets in last 15 minutes, no relief. No SOB, vision changes.

## 2016-01-26 NOTE — H&P (Signed)
History & Physical    Patient ID: Matthew [name] MRN: [id number], DOB/AGE: 1946/04/28   Admit date: 01/26/2016   Primary Physician: PROVIDER NOT IN SYSTEM Primary Cardiologist: Dr. Irish Lack  Patient Profile    70M with CAD s/p CABG c/b POAF now on apixaban and amiodarone, and AAA who presents with CP and AF with RVR in setting of missing an unknown number of amiodarone doses.    Past Medical History    Past Medical History:  Diagnosis Date  . AAA (abdominal aortic aneurysm) ([place])    a. Aortic US 05/2012: 3.9 cm fusiform abdominal aortic aneurysm. F/u recommended 05/2014.  Marland Kitchen Anemia   . Atrial fibrillation ([place])    a. Post-op CABG 05/2009. b. Re-identified 12/2013.  Marland Kitchen CAD (coronary artery disease)    a. s/p CABGx4 05/2009.  Marland Kitchen Chronic constipation   . CKD (chronic kidney disease), stage III   . Cocaine abuse in remission   . Colon polyps   . Diabetes mellitus   . Empyema lung ([place])    a. PNA 02/2011 c/b empyema requiring chest tube, drainage of pleural effusion and decortication.  . Enlarged prostate   . Hepatic steatosis   . Hyperlipidemia   . Hypertension   . Hypothyroidism   . Iron deficiency anemia   . LAFB (left anterior fascicular block)   . Lung cancer ([place])    a. Tx with interleukin in 2002 per patient.  . Nodule of left lung    a. Cavitary nodules 02/2011, f/u recommended.  . Obesity   . Paroxysmal supraventricular tachycardia ([place])    a. While inpatient 02/2011 sick with empyema - wide appearace due to RBBB.  . RBBB   . Renal cell carcinoma    a. s/p R nephrectomy 2002. Reported pulmonary mets per note at that time.  . Scrotal abscess    a. 2012 2/2 Streptococcus group C, status post incision and drainage.  Marland Kitchen Ulcerative proctitis ([place])    a. Remote dx dating back to 1979.  . Vitamin D deficiency     Past Surgical History:  Procedure Laterality Date  . Bronchoscopy, right video-assisted thoracoscopy, minithoracotomy, drainage of pleural effusion, and  decortication.  02/26/2011   [facility]  . CABG x 4  05/13/2009   Hendrickson  . HEMORRHOID SURGERY    . HERNIA REPAIR    . Incision, drainage and debridement of perineum and  scrotum    . Irrigation and debridement of scrotal wound and closure of scrotal wound.    . Right radical nephrectomy.    . TOOTH EXTRACTION       Allergies  Allergies  Allergen Reactions  . Tetanus Toxoids Hives    History of Present Illness    70M with CAD s/p CABG c/b POAF now on apixaban and amiodarone, and AAA who presents with CP and AF with RVR in setting of missing an unknown number of amiodarone doses.    Matthew [name] states he was at home today in his USOH when he got  Glass of milk and a cookie and abruptly developed acute onset of chest pain and left arm numbness. He took 4 SL NTG w/o relief. The symptoms were concerning and he presented to the [facility] ER where he was found to be in AF with RVR.   He was started on IV amiodarone and heparin.  He subsequently converted to NSR. He was subsequently transferred to [facility].   Of note, Matthew [name] reports he has not taken his amiodarone and  apixaban for an unclear number of days.  He states it has been at least 2 days, but may have been many more, if he incorrectly set up his med tray.  He does not know the names of many of his meds. He notes he is concerned that he, like his parents, may be developing dementia.      Home Medications    Prior to Admission medications   Medication Sig Start Date End Date Taking? Authorizing Provider  albuterol (PROVENTIL HFA;VENTOLIN HFA) 108 (90 BASE) MCG/ACT inhaler Inhale 2 puffs into the lungs every 6 (six) hours as needed for wheezing or shortness of breath.    Yes Historical Provider, MD  amiodarone (PACERONE) 200 MG tablet TAKE 2 TABLETS (400 MG TOTAL) BY MOUTH DAILY. 11/11/14  Yes Jettie Booze, MD  apixaban (ELIQUIS) 5 MG TABS tablet Take 5 mg by mouth 2 (two) times daily.   Yes Historical Provider, MD  atorvastatin  (LIPITOR) 80 MG tablet Take 80 mg by mouth daily at 6 PM.   Yes Historical Provider, MD  buPROPion (WELLBUTRIN XL) 150 MG 24 hr tablet Take 150 mg by mouth every morning.   Yes Historical Provider, MD  capsaicin (ZOSTRIX) 0.025 % cream Apply 1 application topically 2 (two) times daily as needed (for pain).    Yes Historical Provider, MD  Cholecalciferol 4000 UNITS CAPS Take 4,000 Units by mouth 3 (three) times daily.   Yes Historical Provider, MD  clotrimazole (LOTRIMIN) 1 % cream Apply 1 application topically 2 (two) times daily as needed (for rash).    Yes Historical Provider, MD  fenofibrate 54 MG tablet Take 1 tablet (54 mg total) by mouth daily. 05/12/15  Yes Brittainy Erie Noe, PA-C  ferrous sulfate dried (SLOW FE) 160 (50 FE) MG TBCR Take 160 mg by mouth 2 (two) times daily with a meal.     Yes Historical Provider, MD  gabapentin (NEURONTIN) 300 MG capsule Take 300 mg by mouth 2 times daily at 12 noon and 4 pm.     Yes Historical Provider, MD  glucose 4 GM chewable tablet Chew 1 tablet by mouth as needed for low blood sugar.   Yes Historical Provider, MD  hydrOXYzine (VISTARIL) 25 MG capsule Take 50 mg by mouth at bedtime.  10/10/14  Yes Historical Provider, MD  ibuprofen (ADVIL,MOTRIN) 800 MG tablet Take 800 mg by mouth every 6 (six) hours as needed for moderate pain.   Yes Historical Provider, MD  Insulin Detemir (LEVEMIR FLEXPEN) 100 UNIT/ML Pen Inject 46 Units into the skin 2 (two) times daily.   Yes Historical Provider, MD  insulin lispro (HUMALOG KWIKPEN) 100 UNIT/ML KiwkPen Inject 16 Units into the skin 3 (three) times daily.    Yes Historical Provider, MD  isosorbide mononitrate (IMDUR) 30 MG 24 hr tablet Take 30 mg by mouth daily.   Yes Historical Provider, MD  ketoconazole (NIZORAL) 2 % cream Apply 1 application topically daily. For rash   Yes Historical Provider, MD  levothyroxine (SYNTHROID, LEVOTHROID) 100 MCG tablet Take 100 mcg by mouth daily before breakfast.    Yes Historical  Provider, MD  Linaclotide (LINZESS) 145 MCG CAPS capsule Take 145 mcg by mouth daily as needed (diarrhea).    Yes Historical Provider, MD  Magnesium Oxide 420 MG TABS Take 420 mg by mouth daily.    Yes Historical Provider, MD  mupirocin ointment (BACTROBAN) 2 % Place 1 application into the nose daily as needed (for mersa).    Yes Historical  Provider, MD  nitroGLYCERIN (NITROSTAT) 0.4 MG SL tablet Place 0.4 mg under the tongue every 5 (five) minutes as needed for chest pain (x 3 doses daily).    Yes Historical Provider, MD  nystatin cream (MYCOSTATIN) Apply 1 application topically 2 (two) times daily.   Yes Historical Provider, MD  Omega-3 Fatty Acids (FISH OIL) 1000 MG CAPS Take 1,000 mg by mouth daily.    Yes Historical Provider, MD  OVER THE COUNTER MEDICATION Apply 1 application topically as needed (for pain). "VA medicine, Theragesic"   Yes Historical Provider, MD  oxyCODONE-acetaminophen (PERCOCET) 10-325 MG per tablet Take 1 tablet by mouth every 4 (four) hours as needed for pain.   Yes Historical Provider, MD  pantoprazole (PROTONIX) 40 MG tablet Take 1 tablet (40 mg total) by mouth 2 (two) times daily. 10/20/14  Yes Jerene Bears, MD  cyclobenzaprine (FLEXERIL) 5 MG tablet Take 5 mg by mouth 3 (three) times daily as needed. 11/17/15   Historical Provider, MD  hydrocortisone-pramoxine [facility]) 2.5-1 % rectal cream Place 1 application rectally 3 (three) times daily as needed for hemorrhoids or itching.     Historical Provider, MD  levofloxacin (LEVAQUIN) 750 MG tablet Take 750 mg by mouth daily. For 10 days 03/06/14   Historical Provider, MD  LIDOCAINE EX Apply 1 application topically 2 (two) times daily as needed (for pain).     Historical Provider, MD  polyethylene glycol powder (GLYCOLAX/MIRALAX) powder DISSOLVE 17 GRAMS (1 CAPFUL) IN AT LEAST 8 OUNCES WATER/JUICE AND DRINK DAILY. Patient taking differently: DISSOLVE 17 GRAMS (1 CAPFUL) IN AT LEAST 8 OUNCES WATER/JUICE AND DRINK DAILY AS  NEEDED FOR CONSTIPATION 10/13/14   Jerene Bears, MD    Family History    Family History  Problem Relation Age of Onset  . Heart disease Father   . Alzheimer's disease Mother   . Pneumonia    . Colon cancer Neg Hx   . Esophageal cancer Neg Hx   . Stomach cancer Neg Hx   . Rectal cancer Neg Hx     Social History    Social History   Social History  . Marital status: Married    Spouse name: N/A  . Number of children: 4  . Years of education: N/A   Occupational History  . 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 Smoking status: Never Smoker  . Smokeless tobacco: Never Used  . Alcohol use No  . Drug use:     Types: Cocaine     Comment: stopped cocaine 2 and 1/2 years ago  . Sexual activity: Not on file   Other Topics Concern  . Not on file   Social History Narrative  . No narrative on file     Review of Systems    General:  No chills, fever, night sweats or weight changes.  Cardiovascular:  See HPI Dermatological: No rash, lesions/masses Respiratory: No cough, dyspnea Urologic: No hematuria, dysuria Abdominal:   No nausea, vomiting, diarrhea, bright red blood per rectum, melena, or hematemesis Neurologic:  No visual changes, wkns, changes in mental status. All other systems reviewed and are otherwise negative except as noted above.  Physical Exam    Blood pressure (!) 155/78, pulse 60, temperature 97.5 F (36.4 C), temperature source Oral, resp. rate 19, height 6' (1.829 m), weight 128.2 kg (282 lb 11.2 oz), SpO2 99 %.  General: Pleasant, NAD Psych: Normal affect. Neuro: Alert and oriented X 3. Moves all extremities spontaneously.  HEENT: Normal  Neck: Supple without bruits or JVD. Lungs:  Resp regular and unlabored, CTA. Heart: RRR no s3, s4, or murmurs. Abdomen: Soft, non-tender, non-distended, BS + x 4.  Extremities: No clubbing, cyanosis or edema. DP/PT/Radials 2+ and equal bilaterally.  Labs    Troponin [facility]  Test)  Recent Labs  01/26/16 1541  TROPIPOC 0.00   No results for input(s): CKTOTAL, CKMB, TROPONINI in the last 72 hours. Lab Results  Component Value Date   WBC 5.3 10/20/2014   HGB 15.3 01/26/2016   HCT 45.0 01/26/2016   MCV 92.8 10/20/2014   PLT 221.0 10/20/2014    Recent Labs Lab 01/26/16 1538 01/26/16 1544  NA 132* 135  K 4.3 4.3  CL 101 101  CO2 23  --   BUN 30* 34*  CREATININE 1.97* 2.00*  CALCIUM 9.1  --   PROT 7.3  --   BILITOT 0.5  --   ALKPHOS 33*  --   ALT 35  --   AST 26  --   GLUCOSE 420* 432*   Lab Results  Component Value Date   CHOL 237 (H) 12/18/2013   HDL 34.00 (L) 12/18/2013   LDLCALC 131 (H) 12/18/2013   TRIG 358.0 (H) 12/18/2013   No results found for: [facility]   Radiology Studies    Dg Chest Port 1 View  Result Date: 01/26/2016 CLINICAL DATA:  Chest pain, LEFT chest pain and tightness with LEFT arm numbness beginning 15 minutes ago, coronary artery disease post multiple MI and CABG, hypertension, lung cancer, atrial fibrillation, emphysema EXAM: PORTABLE CHEST 1 VIEW COMPARISON:  Portable exam 1543 hours compared to 03/08/2014 FINDINGS: Upper normal heart size post CABG. Mediastinal contours and pulmonary vascularity normal. Rotated to the RIGHT. Subsegmental atelectasis at lung bases. Minimal central peribronchial thickening. No infiltrate, pleural effusion or pneumothorax. IMPRESSION: Post CABG. Minimal bronchitic changes and bibasilar atelectasis. Electronically Signed   By: Lavonia Dana M.D.   On: 01/26/2016 15:59    ECG & Cardiac Imaging     ECG 01/26/16 @ 15:23 AF with RVR, LAFB RBBB, HR 152bpm ECG 01/26/16 2 17:41 demonstrated NSR LAFB, RBBB  12/21/13 TTE - Left ventricle: The cavity size was normal. Wall thickness was increased in a pattern of mild LVH. Systolic function was mildly to moderately reduced. The estimated ejection fraction was in the range of 40% to 45%. Hypokinesis of the apical myocardium. Doppler parameters  are consistent with abnormal left ventricular relaxation (grade 1 diastolic dysfunction).  Assessment & Plan    51M with CAD s/p CABG c/b POAF now on apixaban and amiodarone, and AAA who presents with CP and AF with RVR in setting of missing an unknown number of amiodarone doses.  AF resolved with IV amio.   - TTE - TSH - cycle troponins - start metoprolol 12.70m BID - restart home meds.  - Continue IV amiodarone with plan to switch to oral during the day - Continue IV heparin for AF and possible MI; [facility]   Signed, FLamar Sprinkles MD 01/26/2016, 10:52 PM

## 2016-01-27 DIAGNOSIS — I480 Paroxysmal atrial fibrillation: Secondary | ICD-10-CM | POA: Diagnosis not present

## 2016-01-27 DIAGNOSIS — I129 Hypertensive chronic kidney disease with stage 1 through stage 4 chronic kidney disease, or unspecified chronic kidney disease: Secondary | ICD-10-CM | POA: Diagnosis not present

## 2016-01-27 DIAGNOSIS — I4891 Unspecified atrial fibrillation: Secondary | ICD-10-CM | POA: Diagnosis not present

## 2016-01-27 DIAGNOSIS — I25110 Atherosclerotic heart disease of native coronary artery with unstable angina pectoris: Secondary | ICD-10-CM | POA: Diagnosis not present

## 2016-01-27 DIAGNOSIS — I714 Abdominal aortic aneurysm, without rupture: Secondary | ICD-10-CM | POA: Diagnosis not present

## 2016-01-27 DIAGNOSIS — K760 Fatty (change of) liver, not elsewhere classified: Secondary | ICD-10-CM | POA: Diagnosis not present

## 2016-01-27 DIAGNOSIS — E1122 Type 2 diabetes mellitus with diabetic chronic kidney disease: Secondary | ICD-10-CM | POA: Diagnosis not present

## 2016-01-27 LAB — HEPARIN LEVEL (UNFRACTIONATED)
Heparin Unfractionated: 0.28 IU/mL — ABNORMAL LOW (ref 0.30–0.70)
Heparin Unfractionated: 0.24 IU/mL — ABNORMAL LOW (ref 0.30–0.70)

## 2016-01-27 LAB — BASIC METABOLIC PANEL
Anion gap: 8 (ref 5–15)
BUN: 24 mg/dL — AB (ref 6–20)
CO2: 25 mmol/L (ref 22–32)
Calcium: 9.2 mg/dL (ref 8.9–10.3)
Chloride: 103 mmol/L (ref 101–111)
Creatinine, Ser: 1.62 mg/dL — ABNORMAL HIGH (ref 0.61–1.24)
GFR calc Af Amer: 47 mL/min — ABNORMAL LOW (ref >60)
GFR, EST NON AFRICAN AMERICAN: 41 mL/min — AB (ref >60)
GLUCOSE: 273 mg/dL — AB (ref 65–99)
POTASSIUM: 4 mmol/L (ref 3.5–5.1)
Sodium: 136 mmol/L (ref 135–145)

## 2016-01-27 LAB — CBC
HEMATOCRIT: 40.8 % (ref 39.0–52.0)
Hemoglobin: 13.3 g/dL (ref 13.0–17.0)
MCH: 31.1 pg (ref 26.0–34.0)
MCHC: 32.6 g/dL (ref 30.0–36.0)
MCV: 95.3 fL (ref 78.0–100.0)
Platelets: 196 10*3/uL (ref 150–400)
RBC: 4.28 MIL/uL (ref 4.22–5.81)
RDW: 12.9 % (ref 11.5–15.5)
WBC: 5.3 10*3/uL (ref 4.0–10.5)

## 2016-01-27 LAB — TROPONIN I
TROPONIN I: 0.03 ng/mL — AB (ref <0.03)
TROPONIN I: 0.03 ng/mL — AB (ref <0.03)

## 2016-01-27 LAB — HEPATIC FUNCTION PANEL
ALT: 30 U/L (ref 17–63)
AST: 24 U/L (ref 15–41)
Albumin: 3.2 g/dL — ABNORMAL LOW (ref 3.5–5.0)
Alkaline Phosphatase: 29 U/L — ABNORMAL LOW (ref 38–126)
BILIRUBIN DIRECT: 0.1 mg/dL (ref 0.1–0.5)
BILIRUBIN INDIRECT: 0.2 mg/dL — AB (ref 0.3–0.9)
Total Bilirubin: 0.3 mg/dL (ref 0.3–1.2)
Total Protein: 6.4 g/dL — ABNORMAL LOW (ref 6.5–8.1)

## 2016-01-27 LAB — MAGNESIUM: Magnesium: 1.8 mg/dL (ref 1.7–2.4)

## 2016-01-27 LAB — APTT: APTT: 68 s — AB (ref 24–36)

## 2016-01-27 LAB — MRSA PCR SCREENING: MRSA by PCR: NEGATIVE

## 2016-01-27 LAB — TSH: TSH: 1.458 u[IU]/mL (ref 0.350–4.500)

## 2016-01-27 LAB — GLUCOSE, CAPILLARY
GLUCOSE-CAPILLARY: 298 mg/dL — AB (ref 65–99)
Glucose-Capillary: 276 mg/dL — ABNORMAL HIGH (ref 65–99)

## 2016-01-27 MED ORDER — MAGNESIUM SULFATE 2 GM/50ML IV SOLN
2.0000 g | Freq: Once | INTRAVENOUS | Status: AC
  Administered 2016-01-27: 2 g via INTRAVENOUS
  Filled 2016-01-27: qty 50

## 2016-01-27 MED ORDER — APIXABAN 5 MG PO TABS
5.0000 mg | ORAL_TABLET | Freq: Two times a day (BID) | ORAL | Status: DC
  Administered 2016-01-27: 5 mg via ORAL
  Filled 2016-01-27: qty 1

## 2016-01-27 MED ORDER — METOPROLOL TARTRATE 12.5 MG HALF TABLET
12.5000 mg | ORAL_TABLET | Freq: Two times a day (BID) | ORAL | Status: DC
  Administered 2016-01-27: 12.5 mg via ORAL
  Filled 2016-01-27: qty 1

## 2016-01-27 MED ORDER — METOPROLOL TARTRATE 25 MG PO TABS: 12.5000 mg | ORAL_TABLET | Freq: Two times a day (BID) | ORAL | 6 refills | Status: DC

## 2016-01-27 MED ORDER — AMIODARONE HCL 200 MG PO TABS: 400.0000 mg | ORAL_TABLET | Freq: Every day | ORAL | 11 refills | Status: DC

## 2016-01-27 MED ORDER — AMIODARONE HCL 200 MG PO TABS
400.0000 mg | ORAL_TABLET | Freq: Every day | ORAL | Status: DC
  Administered 2016-01-27: 400 mg via ORAL
  Filled 2016-01-27: qty 2

## 2016-01-27 NOTE — Care Management Obs Status (Signed)
MEDICARE OBSERVATION STATUS NOTIFICATION   Patient Details  Name: Matthew [name] MRN: [id number] Date of Birth: 05/01/1946   Medicare Observation Status Notification Given:  Yes    Lacretia Leigh, RN 01/27/2016, 2:06 PM

## 2016-01-27 NOTE — Progress Notes (Addendum)
Subjective: Breathing good  No CP   Objective: Vitals:   01/27/16 0000 01/27/16 0200 01/27/16 0300 01/27/16 0400  BP: (!) 134/54 (!) 148/71    Pulse:      Resp: 15 13 19    Temp: 97.8 F (36.6 C)   98.1 F (36.7 C)  TempSrc: Oral   Oral  SpO2: 100% 99% 97%   Weight:      Height:       Weight change:   Intake/Output Summary (Last 24 hours) at 01/27/16 0720 Last data filed at 01/27/16 0600  Gross per 24 hour  Intake          2134.09 ml  Output             1275 ml  Net           859.09 ml    General: Alert, awake, oriented x3, in no acute distress Neck:  JVP is normal Heart: Regular rate and rhythm, without murmurs, rubs, gallops.  Lungs: Clear to auscultation.  No rales or wheezes. Exemities:  No edema.   Neuro: Grossly intact, nonfocal.  Tele:  AFib earlier  Now SR   Lab Results: Results for orders placed or performed during the hospital encounter of 01/26/16 (from the past 24 hour(s))  Brain natriuretic peptide     Status: Abnormal   Collection Time: 01/26/16  3:32 PM  Result Value Ref Range   B Natriuretic Peptide 215.8 (H) 0.0 - 100.0 pg/mL  Protime-INR     Status: None   Collection Time: 01/26/16  3:33 PM  Result Value Ref Range   Prothrombin Time 13.2 11.4 - 15.2 seconds   INR 1.00   APTT     Status: Abnormal   Collection Time: 01/26/16  3:33 PM  Result Value Ref Range   aPTT 43 (H) 24 - 36 seconds  Heparin level (unfractionated)     Status: None   Collection Time: 01/26/16  3:33 PM  Result Value Ref Range   Heparin Unfractionated 0.51 0.30 - 0.70 IU/mL  Comprehensive metabolic panel     Status: Abnormal   Collection Time: 01/26/16  3:38 PM  Result Value Ref Range   Sodium 132 (L) 135 - 145 mmol/L   Potassium 4.3 3.5 - 5.1 mmol/L   Chloride 101 101 - 111 mmol/L   CO2 23 22 - 32 mmol/L   Glucose, Bld 420 (H) 65 - 99 mg/dL   BUN 30 (H) 6 - 20 mg/dL   Creatinine, Ser 1.97 (H) 0.61 - 1.24 mg/dL   Calcium 9.1 8.9 - 10.3 mg/dL   Total Protein 7.3 6.5 -  8.1 g/dL   Albumin 3.8 3.5 - 5.0 g/dL   AST 26 15 - 41 U/L   ALT 35 17 - 63 U/L   Alkaline Phosphatase 33 (L) 38 - 126 U/L   Total Bilirubin 0.5 0.3 - 1.2 mg/dL   GFR calc non Af Amer 32 (L) >60 mL/min   GFR calc Af Amer 37 (L) >60 mL/min   Anion gap 8 5 - 15  Magnesium     Status: None   Collection Time: 01/26/16  3:38 PM  Result Value Ref Range   Magnesium 1.8 1.7 - 2.4 mg/dL  I-stat troponin, ED     Status: None   Collection Time: 01/26/16  3:41 PM  Result Value Ref Range   Troponin i, poc 0.00 0.00 - 0.08 ng/mL   Comment 3  I-Stat Chem 8, ED     Status: Abnormal   Collection Time: 01/26/16  3:44 PM  Result Value Ref Range   Sodium 135 135 - 145 mmol/L   Potassium 4.3 3.5 - 5.1 mmol/L   Chloride 101 101 - 111 mmol/L   BUN 34 (H) 6 - 20 mg/dL   Creatinine, Ser 2.00 (H) 0.61 - 1.24 mg/dL   Glucose, Bld 432 (H) 65 - 99 mg/dL   Calcium, Ion 1.22 1.12 - 1.23 mmol/L   TCO2 24 0 - 100 mmol/L   Hemoglobin 15.3 13.0 - 17.0 g/dL   HCT 45.0 39.0 - 52.0 %  Glucose, capillary     Status: Abnormal   Collection Time: 01/26/16  9:02 PM  Result Value Ref Range   Glucose-Capillary 223 (H) 65 - 99 mg/dL   Comment 1 Capillary Specimen   MRSA PCR Screening     Status: None   Collection Time: 01/26/16  9:09 PM  Result Value Ref Range   MRSA by PCR NEGATIVE NEGATIVE  TSH     Status: None   Collection Time: 01/27/16 12:29 AM  Result Value Ref Range   TSH 1.458 0.350 - 4.500 uIU/mL  Hepatic function panel     Status: Abnormal   Collection Time: 01/27/16 12:29 AM  Result Value Ref Range   Total Protein 6.4 (L) 6.5 - 8.1 g/dL   Albumin 3.2 (L) 3.5 - 5.0 g/dL   AST 24 15 - 41 U/L   ALT 30 17 - 63 U/L   Alkaline Phosphatase 29 (L) 38 - 126 U/L   Total Bilirubin 0.3 0.3 - 1.2 mg/dL   Bilirubin, Direct 0.1 0.1 - 0.5 mg/dL   Indirect Bilirubin 0.2 (L) 0.3 - 0.9 mg/dL  Magnesium     Status: None   Collection Time: 01/27/16 12:29 AM  Result Value Ref Range   Magnesium 1.8 1.7 - 2.4  mg/dL  Troponin I     Status: Abnormal   Collection Time: 01/27/16 12:29 AM  Result Value Ref Range   Troponin I 0.03 (HH) <0.03 ng/mL  Heparin level (unfractionated)     Status: Abnormal   Collection Time: 01/27/16 12:29 AM  Result Value Ref Range   Heparin Unfractionated 0.28 (L) 0.30 - 0.70 IU/mL  Troponin I     Status: Abnormal   Collection Time: 01/27/16  4:50 AM  Result Value Ref Range   Troponin I 0.03 (HH) <0.03 ng/mL  Basic metabolic panel     Status: Abnormal   Collection Time: 01/27/16  4:50 AM  Result Value Ref Range   Sodium 136 135 - 145 mmol/L   Potassium 4.0 3.5 - 5.1 mmol/L   Chloride 103 101 - 111 mmol/L   CO2 25 22 - 32 mmol/L   Glucose, Bld 273 (H) 65 - 99 mg/dL   BUN 24 (H) 6 - 20 mg/dL   Creatinine, Ser 1.62 (H) 0.61 - 1.24 mg/dL   Calcium 9.2 8.9 - 10.3 mg/dL   GFR calc non Af Amer 41 (L) >60 mL/min   GFR calc Af Amer 47 (L) >60 mL/min   Anion gap 8 5 - 15  CBC     Status: None   Collection Time: 01/27/16  4:50 AM  Result Value Ref Range   WBC 5.3 4.0 - 10.5 K/uL   RBC 4.28 4.22 - 5.81 MIL/uL   Hemoglobin 13.3 13.0 - 17.0 g/dL   HCT 40.8 39.0 - 52.0 %   MCV 95.3 78.0 -  100.0 fL   MCH 31.1 26.0 - 34.0 pg   MCHC 32.6 30.0 - 36.0 g/dL   RDW 12.9 11.5 - 15.5 %   Platelets 196 150 - 400 K/uL  APTT     Status: Abnormal   Collection Time: 01/27/16  4:50 AM  Result Value Ref Range   aPTT 68 (H) 24 - 36 seconds  Heparin level (unfractionated)     Status: Abnormal   Collection Time: 01/27/16  4:50 AM  Result Value Ref Range   Heparin Unfractionated 0.24 (L) 0.30 - 0.70 IU/mL    Studies/Results: Dg Chest Port 1 View  Result Date: 01/26/2016 CLINICAL DATA:  Chest pain, LEFT chest pain and tightness with LEFT arm numbness beginning 15 minutes ago, coronary artery disease post multiple MI and CABG, hypertension, lung cancer, atrial fibrillation, emphysema EXAM: PORTABLE CHEST 1 VIEW COMPARISON:  Portable exam 1543 hours compared to 03/08/2014 FINDINGS:  Upper normal heart size post CABG. Mediastinal contours and pulmonary vascularity normal. Rotated to the RIGHT. Subsegmental atelectasis at lung bases. Minimal central peribronchial thickening. No infiltrate, pleural effusion or pneumothorax. IMPRESSION: Post CABG. Minimal bronchitic changes and bibasilar atelectasis. Electronically Signed   By: Lavonia Dana M.D.   On: 01/26/2016 15:59    Medications:Reviewed    @PROBHOSP @  1  CAD  CP  Hx of CABG  Has R/O for MI   CP is gone    2.  Afib  (CHADSVASc of 4)  Missed doses of amiodarone  Back in SR this AM  Feeling much better   WIll plan to resume oral agents today  Ambulate  Probably d/c home    He says that wife will take care of meds     LOS: 1 day   Matthew [name] 01/27/2016, 7:20 AM

## 2016-01-27 NOTE — Progress Notes (Signed)
Inpatient Diabetes Program Recommendations  AACE/ADA: New Consensus Statement on Inpatient Glycemic Control (2015)  Target Ranges:  Prepandial:   less than 140 mg/dL      Peak postprandial:   less than 180 mg/dL (1-2 hours)      Critically ill patients:  140 - 180 mg/dL   Lab Results  Component Value Date   GLUCAP 298 (H) 01/27/2016   HGBA1C 8.2 (H) 12/20/2013    Review of Glycemic Control:  Results for STEFFON, GLADU (MRN [id number]) as of 01/27/2016 10:39  Ref. Range 01/26/2016 21:02 01/27/2016 09:55  Glucose-Capillary Latest Ref Range: 65 - 99 mg/dL 223 (H) 298 (H)   Diabetes history: Type 2 diabetes Outpatient Diabetes medications: Levemir 46 units bid, Humalog 16 units tid with meals Current orders for Inpatient glycemic control:  Levemir 46 units bid, Novolog 16 units tid with meals.  Inpatient Diabetes Program Recommendations:    Please add Novolog moderate correction tid with meals and HS.    Thanks, Adah Perl, RN, BC-ADM Inpatient Diabetes Coordinator Pager [phone] (8a-5p)

## 2016-01-27 NOTE — Discharge Summary (Signed)
Discharge Summary    Patient ID: Matthew [name],  MRN: [id number], DOB/AGE: 1945/08/07 70 y.o.  Admit date: 01/26/2016 Discharge date: 01/27/2016  Primary Care Provider: PROVIDER NOT [facility] Primary Cardiologist: Dr. Irish Lack  Discharge Diagnoses    Active Problems:   Atrial fibrillation with RVR [facility])   Atrial fibrillation with rapid ventricular response (HCC)   Allergies Allergies  Allergen Reactions  . Tetanus Toxoids Hives    Diagnostic Studies/Procedures    None _____________   History of Present Illness     70 yo male with PMH of CAD s/p CABG/ PAF/ DM II/ HTN/HLD/ hypothyroidism/ AAA and CKD. He receives a majority of his care from the [place] [facility] in [place], currently on Eliquis for anticoagulation, along with amiodarone. [facility] [place] [facility]. He was last seen in the office by Lyda Jester on 05/2015 where he reported doing well. Reported being compliant with his Eliquis dosing, with the [facility] monitoring his TSH and LFP for his amiodarone monitoring. He presented to the [facility] ED on 01/26/2016 with reports of chest pain and a-fib RVR with the setting on missing unknown amount of amiodarone doses after becoming confused when placing them in his pill box. He reports being in his normal state of health at home when he got up to get a glass of milk and a cookie, developing abrupt acute onset of chest pain and left arm numbness. Reports taking 4 SL nitro without any relief. He presented to the [facility] ED and found to be in A-fib RVR. He was started on IV amiodarone and heparin. Afterwards he subsequently converted to NSR, and was transferred to [facility] under cardiology admission.   Hospital Course     Consultants: None   On admission to [facility] his trops were cycled and neg x3, TSH normal, he was continued on IV amiodarone and heparin until the following day when he was transitioned back to PO  medications. He maintained SR throughout admission.   He was seen and assessed by Dr. Harrington Challenger on 01/27/2016 and determined stable for discharge home. He was noted to be without any further chest pain, and ruled out for MI. Reported feeling much better. Was resumed on his oral medications, and ambulated without any further chest pain or dyspnea. I have refilled his prescription for amiodarone, and Lopressor per patient request. Also have sent a message to scheduling to arrange follow-up appointment within 2 weeks with office. Wife has reported that she will assist patient with daily medications in the future given his concern related to mixing up his medications. Informed by the nursing staff that wife believes patient's amiodarone was actually discontinued due to possibly elevated liver enzymes by his M.D. at the [facility]. On admission liver enzymes are noted to be stable, will need to continue to follow these in the outpatient setting. No further changes were made to his medication regimen.  _____________  Discharge Vitals Blood pressure 119/68, pulse 60, temperature 97.8 F (36.6 C), temperature source Oral, resp. rate 13, height 6' (1.829 m), weight 282 lb 11.2 oz (128.2 kg), SpO2 98 %.  Filed Weights   01/26/16 1535 01/26/16 2100  Weight: 280 lb (127 kg) 282 lb 11.2 oz (128.2 kg)    Labs & Radiologic Studies    CBC  Recent Labs  01/26/16 1544 01/27/16 0450  WBC  --  5.3  HGB 15.3 13.3  HCT 45.0 40.8  MCV  --  95.3  PLT  --  409   Basic Metabolic Panel  Recent Labs  01/26/16 1538 01/26/16 1544 01/27/16 0029 01/27/16 0450  NA 132* 135  --  136  K 4.3 4.3  --  4.0  CL 101 101  --  103  CO2 23  --   --  25  GLUCOSE 420* 432*  --  273*  BUN 30* 34*  --  24*  CREATININE 1.97* 2.00*  --  1.62*  CALCIUM 9.1  --   --  9.2  MG 1.8  --  1.8  --    Liver Function Tests  Recent Labs  01/26/16 1538 01/27/16 0029  AST 26 24  ALT 35 30  ALKPHOS 33* 29*  BILITOT 0.5 0.3  PROT 7.3  6.4*  ALBUMIN 3.8 3.2*   No results for input(s): LIPASE, AMYLASE in the last 72 hours. Cardiac Enzymes  Recent Labs  01/27/16 0029 01/27/16 0450 01/27/16 1313  TROPONINI 0.03* 0.03* <0.03   BNP Invalid input(s): POCBNP D-Dimer No results for input(s): DDIMER in the last 72 hours. Hemoglobin A1C No results for input(s): HGBA1C in the last 72 hours. Fasting Lipid Panel No results for input(s): CHOL, HDL, LDLCALC, TRIG, CHOLHDL, LDLDIRECT in the last 72 hours. Thyroid Function Tests  Recent Labs  01/27/16 0029  TSH 1.458   _____________  Dg Chest Port 1 View  Result Date: 01/26/2016 CLINICAL DATA:  Chest pain, LEFT chest pain and tightness with LEFT arm numbness beginning 15 minutes ago, coronary artery disease post multiple MI and CABG, hypertension, lung cancer, atrial fibrillation, emphysema EXAM: PORTABLE CHEST 1 VIEW COMPARISON:  Portable exam 1543 hours compared to 03/08/2014 FINDINGS: Upper normal heart size post CABG. Mediastinal contours and pulmonary vascularity normal. Rotated to the RIGHT. Subsegmental atelectasis at lung bases. Minimal central peribronchial thickening. No infiltrate, pleural effusion or pneumothorax. IMPRESSION: Post CABG. Minimal bronchitic changes and bibasilar atelectasis. Electronically Signed   By: Lavonia Dana M.D.   On: 01/26/2016 15:59   Disposition   Pt is being discharged home today in good condition.  Follow-up Plans & Appointments    Follow-up Information    Larae Grooms, MD .   Specialties:  Cardiology, Radiology, Interventional Cardiology Why:  The office will call you with your follow up appt with 24-48 hours. Thank you! Contact information: [address] [address] [phone]          Discharge Instructions    Diet - low sodium heart healthy    Complete by:  As directed   Discharge instructions    Complete by:  As directed   I have sent in new prescriptions for both the amiodarone  and lopressor to your pharmacy. The office will call you with a follow up appt.   Increase activity slowly    Complete by:  As directed      Discharge Medications   Discharge Medication List as of 01/27/2016  3:31 PM    START taking these medications   Details  metoprolol tartrate (LOPRESSOR) 25 MG tablet Take 0.5 tablets (12.5 mg total) by mouth 2 (two) times daily., Starting Fri 01/27/2016, Normal      CONTINUE these medications which have CHANGED   Details  amiodarone (PACERONE) 200 MG tablet Take 2 tablets (400 mg total) by mouth daily. Ok to take one tablet in the morning and one tablet in the evening, Starting Fri 01/27/2016, Normal      CONTINUE these medications which have NOT CHANGED  Details  albuterol (PROVENTIL HFA;VENTOLIN HFA) 108 (90 BASE) MCG/ACT inhaler Inhale 2 puffs into the lungs every 6 (six) hours as needed for wheezing or shortness of breath. , Until Discontinued, Historical Med    apixaban (ELIQUIS) 5 MG TABS tablet Take 5 mg by mouth 2 (two) times daily., Until Discontinued, Historical Med    atorvastatin (LIPITOR) 80 MG tablet Take 80 mg by mouth daily at 6 PM., Historical Med    buPROPion (WELLBUTRIN XL) 150 MG 24 hr tablet Take 150 mg by mouth every morning., Until Discontinued, Historical Med    capsaicin (ZOSTRIX) 0.025 % cream Apply 1 application topically 2 (two) times daily as needed (for pain). , Until Discontinued, Historical Med    Cholecalciferol 4000 UNITS CAPS Take 4,000 Units by mouth 3 (three) times daily., Historical Med    clotrimazole (LOTRIMIN) 1 % cream Apply 1 application topically 2 (two) times daily as needed (for rash). , Until Discontinued, Historical Med    fenofibrate 54 MG tablet Take 1 tablet (54 mg total) by mouth daily., Starting Thu 05/12/2015, Normal    ferrous sulfate dried (SLOW FE) 160 (50 FE) MG TBCR Take 160 mg by mouth 2 (two) times daily with a meal.  , Until Discontinued, Historical Med    gabapentin (NEURONTIN)  300 MG capsule Take 300 mg by mouth 2 times daily at 12 noon and 4 pm.  , Until Discontinued, Historical Med    glucose 4 GM chewable tablet Chew 1 tablet by mouth as needed for low blood sugar., Until Discontinued, Historical Med    hydrOXYzine (VISTARIL) 25 MG capsule Take 50 mg by mouth at bedtime. , Starting Sun 10/10/2014, Historical Med    ibuprofen (ADVIL,MOTRIN) 800 MG tablet Take 800 mg by mouth every 6 (six) hours as needed for moderate pain., Historical Med    Insulin Detemir (LEVEMIR FLEXPEN) 100 UNIT/ML Pen Inject 46 Units into the skin 2 (two) times daily., Historical Med    insulin lispro (HUMALOG KWIKPEN) 100 UNIT/ML KiwkPen Inject 16 Units into the skin 3 (three) times daily. , Historical Med    isosorbide mononitrate (IMDUR) 30 MG 24 hr tablet Take 30 mg by mouth daily., Until Discontinued, Historical Med    ketoconazole (NIZORAL) 2 % cream Apply 1 application topically daily. For rash, Historical Med    levothyroxine (SYNTHROID, LEVOTHROID) 100 MCG tablet Take 100 mcg by mouth daily before breakfast. , Until Discontinued, Historical Med    Linaclotide (LINZESS) 145 MCG CAPS capsule Take 145 mcg by mouth daily as needed (diarrhea). , Historical Med    Magnesium Oxide 420 MG TABS Take 420 mg by mouth daily. , Until Discontinued, Historical Med    mupirocin ointment (BACTROBAN) 2 % Place 1 application into the nose daily as needed (for mersa). , Until Discontinued, Historical Med    nitroGLYCERIN (NITROSTAT) 0.4 MG SL tablet Place 0.4 mg under the tongue every 5 (five) minutes as needed for chest pain (x 3 doses daily). , Until Discontinued, Historical Med    Omega-3 Fatty Acids (FISH OIL) 1000 MG CAPS Take 1,000 mg by mouth daily. , Until Discontinued, Historical Med    OVER THE COUNTER MEDICATION Apply 1 application topically as needed (for pain). "VA medicine, Theragesic", Until Discontinued, Historical Med    oxyCODONE-acetaminophen (PERCOCET) 10-325 MG per tablet Take  1 tablet by mouth every 4 (four) hours as needed for pain., Until Discontinued, Historical Med    pantoprazole (PROTONIX) 40 MG tablet Take 1 tablet (40 mg total)  by mouth 2 (two) times daily., Starting Wed 10/20/2014, Normal    hydrocortisone-pramoxine (ANALPRAM-HC) 2.5-1 % rectal cream Place 1 application rectally 3 (three) times daily as needed for hemorrhoids or itching. , Until Discontinued, Historical Med    LIDOCAINE EX Apply 1 application topically 2 (two) times daily as needed (for pain). , Until Discontinued, Historical Med    polyethylene glycol powder (GLYCOLAX/MIRALAX) powder DISSOLVE 17 GRAMS (1 CAPFUL) IN AT LEAST 8 OUNCES WATER/JUICE AND DRINK DAILY., Normal      STOP taking these medications     nystatin cream (MYCOSTATIN)           Outstanding Labs/Studies   Continued monitoring of LFTs in the outpatient setting.  Duration of Discharge Encounter   Greater than 30 minutes including physician time.  Signed, Reino Bellis NP-C 01/27/2016, 5:15 PM

## 2016-01-27 NOTE — Progress Notes (Signed)
[place] for apixaban Indication: atrial fibrillation  Patient Measurements: Height: 6' (182.9 cm) Weight: 282 lb 11.2 oz (128.2 kg) IBW/kg (Calculated) : 77.6  Labs:  Recent Labs  01/26/16 1533 01/26/16 1538 01/26/16 1544 01/27/16 0029 01/27/16 0450 01/27/16 1313  HGB  --   --  15.3  --  13.3  --   HCT  --   --  45.0  --  40.8  --   PLT  --   --   --   --  196  --   APTT 43*  --   --   --  68*  --   LABPROT 13.2  --   --   --   --   --   INR 1.00  --   --   --   --   --   HEPARINUNFRC 0.51  --   --  0.28* 0.24*  --   CREATININE  --  1.97* 2.00*  --  1.62*  --   TROPONINI  --   --   --  0.03* 0.03* <0.03    Estimated Creatinine Clearance: 57 mL/min (by C-G formula based on SCr of 1.62 mg/dL).  Assessment: 70 yo male with history of AF had chest pain on admission and heparin was started. Chest pain has resolved and patient now transitioned back to apixaban which he was on PTA. CHADSVASC at least 4.   Plan:  Age 70, weight 128kg, SCr 1.62 Apixaban 5 mg twice daily   Melburn Popper, PharmD Clinical Pharmacy Resident Pager: [phone] 01/27/16 2:50 PM

## 2016-01-27 NOTE — Progress Notes (Signed)
[place] for Heparin Indication: chest pain/ACS  Allergies  Allergen Reactions  . Tetanus Toxoids Hives    Patient Measurements: Height: 6' (182.9 cm) Weight: 282 lb 11.2 oz (128.2 kg) IBW/kg (Calculated) : 77.6 Heparin Dosing Weight: 108 kg  Vital Signs: Temp: 98.1 F (36.7 C) (08/18 0400) Temp Source: Oral (08/18 0400) BP: 148/71 (08/18 0200) Pulse Rate: 60 (08/17 2000)  Labs:  Recent Labs  01/26/16 1533 01/26/16 1538 01/26/16 1544 01/27/16 0029 01/27/16 0450  HGB  --   --  15.3  --  13.3  HCT  --   --  45.0  --  40.8  PLT  --   --   --   --  196  APTT 43*  --   --   --  68*  LABPROT 13.2  --   --   --   --   INR 1.00  --   --   --   --   HEPARINUNFRC 0.51  --   --  0.28* 0.24*  CREATININE  --  1.97* 2.00*  --  1.62*  TROPONINI  --   --   --  0.03*  --     Estimated Creatinine Clearance: 57 mL/min (by C-G formula based on SCr of 1.62 mg/dL).  Assessment: 70 y.o. male with chest pain, h/o Afib and Eliquis on hold, for heparin.    Goal of Therapy:  Heparin level 0.3-0.7 Monitor platelets by anticoagulation protocol: Yes   Plan:  Increase Heparin 1500 units/hr As heparin level and aPTT seem to be correlating, will check heparin level in 8 hours  Phillis Knack, PharmD, BCPS  01/27/2016 5:43 AM  .

## 2016-02-06 ENCOUNTER — Encounter: Payer: Self-pay | Admitting: Cardiology

## 2016-02-17 DIAGNOSIS — R05 Cough: Secondary | ICD-10-CM | POA: Diagnosis not present

## 2016-02-21 ENCOUNTER — Ambulatory Visit (INDEPENDENT_AMBULATORY_CARE_PROVIDER_SITE_OTHER): Payer: 59 | Admitting: Cardiology

## 2016-02-21 ENCOUNTER — Encounter: Payer: Self-pay | Admitting: Cardiology

## 2016-02-21 VITALS — BP 116/78 | HR 61 | Ht 72.0 in | Wt 274.0 lb

## 2016-02-21 DIAGNOSIS — I4891 Unspecified atrial fibrillation: Secondary | ICD-10-CM

## 2016-02-21 DIAGNOSIS — I200 Unstable angina: Secondary | ICD-10-CM

## 2016-02-21 MED ORDER — AMIODARONE HCL 200 MG PO TABS: 200.0000 mg | ORAL_TABLET | Freq: Every day | ORAL | 11 refills | Status: DC

## 2016-02-21 NOTE — Patient Instructions (Addendum)
Your physician has recommended you make the following change in your medication:  DECREASE  AMIODARONE  TO  200 MG  EVERY DAY  Your physician recommends that you schedule a follow-up appointment in: [place]

## 2016-02-21 NOTE — Progress Notes (Signed)
02/21/2016 Matthew [name]   1945-10-18  [id number]  Primary Physician PROVIDER NOT IN SYSTEM Primary Cardiologist: Dr. Irish Lack    Reason for Visit/CC: [facility] F/u for PAF   HPI:  70 yo male with PMH of CAD s/p CABG/ PAF/ DM II/ HTN/HLD/ hypothyroidism/ AAA and CKD. He receives a majority of his care from the [place] [facility] in [place], currently on Eliquis for anticoagulation, along with amiodarone. [facility] also [facility] [place] servalience for vascular disease.   He recently presented to the [facility] ED on 01/26/2016 with reports of chest pain and a-fib RVR with the setting on missing unknown amount of amiodarone doses after becoming confused when placing them in his pill box. He reports being in his normal state of health at home when he got up to get a glass of milk and a cookie, developing abrupt acute onset of chest pain and left arm numbness. Reports taking 4 SL nitro without any relief. He presented to the [facility] ED and found to be in A-fib RVR. He was started on IV amiodarone and heparin. Afterwards he subsequently converted to NSR, and was transferred to [facility] under cardiology admission. On admission to [facility] his trops were cycled and neg x3, TSH normal, he was continued on IV amiodarone and heparin until the following day when he was transitioned back to PO medications. He maintained SR throughout admission.  He was discharged back home on 01/27/16.  He presents to clinic today for f/u. He reports that he has done well. He denies any recurrent CP. No palpitations, dyspnea, syncope/ near syncope. EKG shows NSR with bifascicular block. VR is 61 bpm. He reports full medication compliance. His wife has been assisting him with his meds. He is requesting for clearance to temporarily hold his Eliquis for spinal injection for chronic LBP. He denies any prior h/o stroke/ TIA, PE nor DVT.   Current Outpatient Prescriptions  Medication Sig Dispense Refill  . albuterol  (PROVENTIL HFA;VENTOLIN HFA) 108 (90 BASE) MCG/ACT inhaler Inhale 2 puffs into the lungs every 6 (six) hours as needed for wheezing or shortness of breath.     Marland Kitchen amiodarone (PACERONE) 200 MG tablet Take 1 tablet (200 mg total) by mouth daily. 60 tablet 11  . apixaban (ELIQUIS) 5 MG TABS tablet Take 5 mg by mouth 2 (two) times daily.    Marland Kitchen atorvastatin (LIPITOR) 80 MG tablet Take 80 mg by mouth daily at 6 PM.    . buPROPion (WELLBUTRIN XL) 150 MG 24 hr tablet Take 150 mg by mouth every morning.    . capsaicin (ZOSTRIX) 0.025 % cream Apply 1 application topically 2 (two) times daily as needed (for pain).     . Cholecalciferol 4000 UNITS CAPS Take 4,000 Units by mouth 3 (three) times daily.    . clotrimazole (LOTRIMIN) 1 % cream Apply 1 application topically 2 (two) times daily as needed (for rash).     . fenofibrate 54 MG tablet Take 1 tablet (54 mg total) by mouth daily. 90 tablet 3  . ferrous sulfate dried (SLOW FE) 160 (50 FE) MG TBCR Take 160 mg by mouth 2 (two) times daily with a meal.      . gabapentin (NEURONTIN) 300 MG capsule Take 300 mg by mouth 2 times daily at 12 noon and 4 pm.      . glucose 4 GM chewable tablet Chew 1 tablet by mouth as needed for low blood sugar.    . hydrocortisone-pramoxine (  ANALPRAM-HC) 2.5-1 % rectal cream Place 1 application rectally 3 (three) times daily as needed for hemorrhoids or itching.     . hydrOXYzine (VISTARIL) 25 MG capsule Take 50 mg by mouth at bedtime.   1  . ibuprofen (ADVIL,MOTRIN) 800 MG tablet Take 800 mg by mouth every 6 (six) hours as needed for moderate pain.    . Insulin Detemir (LEVEMIR FLEXPEN) 100 UNIT/ML Pen Inject 46 Units into the skin 2 (two) times daily.    . insulin lispro (HUMALOG KWIKPEN) 100 UNIT/ML KiwkPen Inject 16 Units into the skin 3 (three) times daily.     . isosorbide mononitrate (IMDUR) 30 MG 24 hr tablet Take 30 mg by mouth daily.    Marland Kitchen ketoconazole (NIZORAL) 2 % cream Apply 1 application topically daily. For rash    .  levothyroxine (SYNTHROID, LEVOTHROID) 100 MCG tablet Take 100 mcg by mouth daily before breakfast.     . LIDOCAINE EX Apply 1 application topically 2 (two) times daily as needed (for pain).     . Linaclotide (LINZESS) 145 MCG CAPS capsule Take 145 mcg by mouth daily as needed (diarrhea).     . Magnesium Oxide 420 MG TABS Take 420 mg by mouth daily.     . metoprolol tartrate (LOPRESSOR) 25 MG tablet Take 0.5 tablets (12.5 mg total) by mouth 2 (two) times daily. 60 tablet 6  . mupirocin ointment (BACTROBAN) 2 % Place 1 application into the nose daily as needed (for mersa).     . nitroGLYCERIN (NITROSTAT) 0.4 MG SL tablet Place 0.4 mg under the tongue every 5 (five) minutes as needed for chest pain (x 3 doses daily).     . Omega-3 Fatty Acids (FISH OIL) 1000 MG CAPS Take 1,000 mg by mouth daily.     Marland Kitchen OVER THE COUNTER MEDICATION Apply 1 application topically as needed (for pain). "VA medicine, Theragesic"    . oxyCODONE-acetaminophen (PERCOCET) 10-325 MG per tablet Take 1 tablet by mouth every 4 (four) hours as needed for pain.    . pantoprazole (PROTONIX) 40 MG tablet Take 1 tablet (40 mg total) by mouth 2 (two) times daily. 60 tablet 2  . polyethylene glycol powder (GLYCOLAX/MIRALAX) powder DISSOLVE 17 GRAMS (1 CAPFUL) IN AT LEAST 8 OUNCES WATER/JUICE AND DRINK DAILY. (Patient taking differently: DISSOLVE 17 GRAMS (1 CAPFUL) IN AT LEAST 8 OUNCES WATER/JUICE AND DRINK DAILY AS NEEDED FOR CONSTIPATION) 527 g 2   No current facility-administered medications for this visit.     Allergies  Allergen Reactions  . Tetanus Toxoids Hives    Social History   Social History  . Marital status: Married    Spouse name: N/A  . Number of children: 4  . Years of education: N/A   Occupational History  . 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 Smoking status: Never Smoker  . Smokeless tobacco: Never Used  . Alcohol use No  . Drug use:     Types: Cocaine     Comment: stopped cocaine  2 and 1/2 years ago  . Sexual activity: Not on file   Other Topics Concern  . Not on file   Social History Narrative  . No narrative on file     Review of Systems: General: negative for chills, fever, night sweats or weight changes.  Cardiovascular: negative for chest pain, dyspnea on exertion, edema, orthopnea, palpitations, paroxysmal nocturnal dyspnea or shortness of breath Dermatological: negative for rash Respiratory: negative for cough or wheezing  Urologic: negative for hematuria Abdominal: negative for nausea, vomiting, diarrhea, bright red blood per rectum, melena, or hematemesis Neurologic: negative for visual changes, syncope, or dizziness All other systems reviewed and are otherwise negative except as noted above.    Blood pressure 116/78, pulse 61, height 6' (1.829 m), weight 274 lb (124.3 kg).  General appearance: alert, cooperative and no distress Neck: no carotid bruit and no JVD Lungs: clear to auscultation bilaterally Heart: regular rate and rhythm, S1, S2 normal, no murmur, click, rub or gallop Extremities: no LEE Pulses: 2+ and symmetric Skin: warm and dry Neurologic: Grossly normal  EKG NSR with bifascicular block   ASSESSMENT AND PLAN:   1. CAD: s/p CABG in 2010. Stable w/o angina now in NSR. Continue medical therapy.  2. HTN: BP is well controlled on current regimen.   3. HLD: followed at [place].  4. [facility]  5. Carotid Artery Disease: patient notes yearly f/u conducted at [place]. Last [place] on file showed 50% stenosis on the right. Asymptomatic w/o syncope/ near syncope.   6. AAA: [facility]. Denies tobacco use. BP is well controlled.   7. PAF: currently in NSR on amiodarone. He has remained on 200 mg BID for the last month. Will reduce dose down to 200 mg daily. He is on Eliquis for a/c and denies any abnormal bleeding or falls.   8. Chronic LBP: patient is requesting clearance to temporarily hold his Eliquis for spinal  injection for chronic LBP. He denies any prior h/o stroke/ TIA, PE nor DVT. Given recent conversion to NSR with IV amiodarone, would perfer to wait a bit longer before holding Eliquis temporarily. Will notify Dr. Irish Lack to review chart to determine when to clear.    PLAN  F/u with Dr. Irish Lack in 3 months.   Lyda Jester PA-C 02/21/2016 2:53 PM

## 2016-02-22 NOTE — Progress Notes (Signed)
OK to hold Eliquis per PharmD protocol.  Would wait 3 weeks post most recent conversion to NSR.

## 2016-02-24 NOTE — Progress Notes (Signed)
OK to follow Megan's recs.

## 2016-02-27 ENCOUNTER — Telehealth: Payer: Self-pay | Admitting: *Deleted

## 2016-02-27 NOTE — Progress Notes (Signed)
**Note De-Identified  Obfuscation** Matthew [name], RMA      02/27/16 9:51 AM  Note    Spoke to pt, per Ellen Henri, PA-C, re: pt stopping Eliquis for back injections.  Pt has been advised  "OK to hold Eliquis per PharmD protocol. Would wait 3 weeks post most recent conversion to NSR."    He will need to wait until another week to do this. Please notify patient. He will need to resume Eliquis once ok to do so from his other provider giving the back injection   Pt agreeable with this plan and verbalized understanding and denied any questions.

## 2016-02-27 NOTE — Telephone Encounter (Signed)
Spoke to pt, per [place], PA-C, re: pt stopping Eliquis for back injections.  Pt has been advised  "OK to hold Eliquis per PharmD protocol. Would wait 3 weeks post most recent conversion to NSR."    He will need to wait until another week to do this. Please notify patient. He will need to resume Eliquis once ok to do so from his other provider giving the back injection   Pt agreeable with this plan and verbalized understanding and denied any questions.

## 2016-02-27 NOTE — Telephone Encounter (Signed)
-----   Message from Consuelo Pandy, [place] sent at 02/24/2016 10:39 AM EDT ----- Regarding: call patient with medication udate I consulted Dr. Irish Lack. Regarding holding Eliqiuis temporarily for back injection. Per Dr. Irish Lack,  "OK to hold Eliquis per PharmD protocol.  Would wait 3 weeks post most recent conversion to NSR."  He will need to wait until another week to do this. Please notify patient. He will need to resume Eliquis once ok to do so from his other provider giving the back injection.   Thanks,  [place]

## 2016-03-16 DIAGNOSIS — Z85528 Personal history of other malignant neoplasm of kidney: Secondary | ICD-10-CM | POA: Diagnosis not present

## 2016-04-03 DIAGNOSIS — J9811 Atelectasis: Secondary | ICD-10-CM | POA: Diagnosis not present

## 2016-04-03 DIAGNOSIS — Z85118 Personal history of other malignant neoplasm of bronchus and lung: Secondary | ICD-10-CM | POA: Diagnosis not present

## 2016-04-03 DIAGNOSIS — J90 Pleural effusion, not elsewhere classified: Secondary | ICD-10-CM | POA: Diagnosis not present

## 2016-04-03 DIAGNOSIS — Z85528 Personal history of other malignant neoplasm of kidney: Secondary | ICD-10-CM | POA: Diagnosis not present

## 2016-04-03 DIAGNOSIS — Z905 Acquired absence of kidney: Secondary | ICD-10-CM | POA: Diagnosis not present

## 2016-04-03 DIAGNOSIS — Z48815 Encounter for surgical aftercare following surgery on the digestive system: Secondary | ICD-10-CM | POA: Diagnosis not present

## 2016-05-23 ENCOUNTER — Other Ambulatory Visit: Payer: Self-pay | Admitting: Cardiology

## 2016-05-23 NOTE — Telephone Encounter (Signed)
New message ° °Pt is returning call  ° °Please call back °

## 2016-05-27 NOTE — Progress Notes (Deleted)
Cardiology Office Note   Date:  05/27/2016   ID:  Matthew [name] [birth year], MRN [id number]  PCP:  PROVIDER NOT IN SYSTEM    No chief complaint on file. f/u CAD, AFib   Wt Readings from Last 3 Encounters:  02/21/16 274 lb (124.3 kg)  01/26/16 282 lb 11.2 oz (128.2 kg)  05/12/15 271 lb 12.8 oz (123.3 kg)       History of Present Illness: Matthew [name] is a 70 y.o. male  With CAD s/p CABG.  He had an empyema a few years later.  He hasAFib managed with Amiodarone and stroke prevention with Eliquis.  Known AAA  He was hospitalized in 8/17 for AFib after missing Amiodarone doses.  He converted to NSR with IV Amiodarone.  He held Eliquis for a few days when he needed back injections.      Past Medical History:  Diagnosis Date  . AAA (abdominal aortic aneurysm) ([place])    a. Aortic US 05/2012: 3.9 cm fusiform abdominal aortic aneurysm. F/u recommended 05/2014.  Marland Kitchen Anemia   . Atrial fibrillation ([place])    a. Post-op CABG 05/2009. b. Re-identified 12/2013.  Marland Kitchen CAD (coronary artery disease)    a. s/p CABGx4 05/2009.  Marland Kitchen Chronic constipation   . CKD (chronic kidney disease), stage III   . Cocaine abuse in remission   . Colon polyps   . Diabetes mellitus   . Empyema lung ([place])    a. PNA 02/2011 c/b empyema requiring chest tube, drainage of pleural effusion and decortication.  . Enlarged prostate   . Hepatic steatosis   . Hyperlipidemia   . Hypertension   . Hypothyroidism   . Iron deficiency anemia   . LAFB (left anterior fascicular block)   . Lung cancer ([place])    a. Tx with interleukin in 2002 per patient.  . Nodule of left lung    a. Cavitary nodules 02/2011, f/u recommended.  . Obesity   . Paroxysmal supraventricular tachycardia ([place])    a. While inpatient 02/2011 sick with empyema - wide appearace due to RBBB.  . RBBB   . Renal cell carcinoma    a. s/p R nephrectomy 2002. Reported pulmonary mets per note at that time.  . Scrotal abscess    a. 2012 2/2  Streptococcus group C, status post incision and drainage.  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  . Vitamin D deficiency     Past Surgical History:  Procedure Laterality Date  . Bronchoscopy, right video-assisted thoracoscopy, minithoracotomy, drainage of pleural effusion, and decortication.  02/26/2011   Matthew [name]  . CABG x 4  05/13/2009   Matthew [name]  . HEMORRHOID SURGERY    . HERNIA REPAIR    . Incision, drainage and debridement of perineum and  scrotum    . Irrigation and debridement of scrotal wound and closure of scrotal wound.    . Right radical nephrectomy.    . TOOTH EXTRACTION       Current Outpatient Prescriptions  Medication Sig Dispense Refill  . albuterol (PROVENTIL HFA;VENTOLIN HFA) 108 (90 BASE) MCG/ACT inhaler Inhale 2 puffs into the lungs every 6 (six) hours as needed for wheezing or shortness of breath.     Marland Kitchen amiodarone (PACERONE) 200 MG tablet Take 1 tablet (200 mg total) by mouth daily. 60 tablet 11  . apixaban (ELIQUIS) 5 MG TABS tablet Take 5 mg by mouth 2 (two) times daily.    Marland Kitchen  atorvastatin (LIPITOR) 80 MG tablet Take 80 mg by mouth daily at 6 PM.    . buPROPion (WELLBUTRIN XL) 150 MG 24 hr tablet Take 150 mg by mouth every morning.    . capsaicin (ZOSTRIX) 0.025 % cream Apply 1 application topically 2 (two) times daily as needed (for pain).     . Cholecalciferol 4000 UNITS CAPS Take 4,000 Units by mouth 3 (three) times daily.    . clotrimazole (LOTRIMIN) 1 % cream Apply 1 application topically 2 (two) times daily as needed (for rash).     . fenofibrate 54 MG tablet TAKE 1 TABLET (54 MG TOTAL) BY MOUTH DAILY. 90 tablet 3  . ferrous sulfate dried (SLOW FE) 160 (50 FE) MG TBCR Take 160 mg by mouth 2 (two) times daily with a meal.      . gabapentin (NEURONTIN) 300 MG capsule Take 300 mg by mouth 2 times daily at 12 noon and 4 pm.      . glucose 4 GM chewable tablet Chew 1 tablet by mouth as needed for low blood sugar.    .  hydrocortisone-pramoxine (ANALPRAM-HC) 2.5-1 % rectal cream Place 1 application rectally 3 (three) times daily as needed for hemorrhoids or itching.     . hydrOXYzine (VISTARIL) 25 MG capsule Take 50 mg by mouth at bedtime.   1  . ibuprofen (ADVIL,MOTRIN) 800 MG tablet Take 800 mg by mouth every 6 (six) hours as needed for moderate pain.    . Insulin Detemir (LEVEMIR FLEXPEN) 100 UNIT/ML Pen Inject 46 Units into the skin 2 (two) times daily.    . insulin lispro (HUMALOG KWIKPEN) 100 UNIT/ML KiwkPen Inject 16 Units into the skin 3 (three) times daily.     . isosorbide mononitrate (IMDUR) 30 MG 24 hr tablet Take 30 mg by mouth daily.    Marland Kitchen ketoconazole (NIZORAL) 2 % cream Apply 1 application topically daily. For rash    . levothyroxine (SYNTHROID, LEVOTHROID) 100 MCG tablet Take 100 mcg by mouth daily before breakfast.     . LIDOCAINE EX Apply 1 application topically 2 (two) times daily as needed (for pain).     . Linaclotide (LINZESS) 145 MCG CAPS capsule Take 145 mcg by mouth daily as needed (diarrhea).     . Magnesium Oxide 420 MG TABS Take 420 mg by mouth daily.     . metoprolol tartrate (LOPRESSOR) 25 MG tablet Take 0.5 tablets (12.5 mg total) by mouth 2 (two) times daily. 60 tablet 6  . mupirocin ointment (BACTROBAN) 2 % Place 1 application into the nose daily as needed (for mersa).     . nitroGLYCERIN (NITROSTAT) 0.4 MG SL tablet Place 0.4 mg under the tongue every 5 (five) minutes as needed for chest pain (x 3 doses daily).     . Omega-3 Fatty Acids (FISH OIL) 1000 MG CAPS Take 1,000 mg by mouth daily.     Marland Kitchen OVER THE COUNTER MEDICATION Apply 1 application topically as needed (for pain). "VA medicine, Theragesic"    . oxyCODONE-acetaminophen (PERCOCET) 10-325 MG per tablet Take 1 tablet by mouth every 4 (four) hours as needed for pain.    . pantoprazole (PROTONIX) 40 MG tablet Take 1 tablet (40 mg total) by mouth 2 (two) times daily. 60 tablet 2  . polyethylene glycol powder (GLYCOLAX/MIRALAX)  powder DISSOLVE 17 GRAMS (1 CAPFUL) IN AT LEAST 8 OUNCES WATER/JUICE AND DRINK DAILY. (Patient taking differently: DISSOLVE 17 GRAMS (1 CAPFUL) IN AT LEAST 8 OUNCES WATER/JUICE AND DRINK DAILY  AS NEEDED FOR CONSTIPATION) 527 g 2   No current facility-administered medications for this visit.     Allergies:   Tetanus toxoids    Social History:  The patient  reports that he has never smoked. He has never used smokeless tobacco. He reports that he uses drugs, including Cocaine. He reports that he does not drink alcohol.   Family History:  The patient's ***family history includes Alzheimer's disease in his mother; Heart disease in his father.    ROS:  Please see the history of present illness.   Otherwise, review of systems are positive for ***.   All other systems are reviewed and negative.    PHYSICAL EXAM: VS:  There were no vitals taken for this visit. , BMI There is no height or weight on file to calculate BMI. GEN: Well nourished, well developed, in no acute distress  HEENT: normal  Neck: no JVD, carotid bruits, or masses Cardiac: ***RRR; no murmurs, rubs, or gallops,no edema  Respiratory:  clear to auscultation bilaterally, normal work of breathing GI: soft, nontender, nondistended, + BS MS: no deformity or atrophy  Skin: warm and dry, no rash Neuro:  Strength and sensation are intact Psych: euthymic mood, full affect   EKG:   The ekg ordered today demonstrates ***   Recent Labs: 01/26/2016: B Natriuretic Peptide 215.8 01/27/2016: ALT 30; BUN 24; Creatinine, Ser 1.62; Hemoglobin 13.3; Magnesium 1.8; Platelets 196; Potassium 4.0; Sodium 136; TSH 1.458   Lipid Panel    Component Value Date/Time   CHOL 237 (H) 12/18/2013 0906   TRIG 358.0 (H) 12/18/2013 0906   HDL 34.00 (L) 12/18/2013 0906   CHOLHDL 7 12/18/2013 0906   VLDL 71.6 (H) 12/18/2013 0906   LDLCALC 131 (H) 12/18/2013 7915     Other studies Reviewed: Additional studies/ records that were reviewed today with  results demonstrating: ***.   ASSESSMENT AND PLAN:  1. CAD: 2. AFib: 3. AAA:   Current medicines are reviewed at length with the patient today.  The patient concerns regarding his medicines were addressed.  The following changes have been made:  No change***  Labs/ tests ordered today include: *** No orders of the defined types were placed in this encounter.   Recommend 150 minutes/week of aerobic exercise Low fat, low carb, high fiber diet recommended  Disposition:   FU in ***   Signed, Larae Grooms, MD  05/27/2016 9:43 PM    [facility] [address], [place], [place]  [address] Phone: [phone]; Fax: [phone]

## 2016-05-28 ENCOUNTER — Ambulatory Visit: Payer: Self-pay | Admitting: Interventional Cardiology

## 2016-06-10 IMAGING — CR DG CHEST 2V
2 series · 2 of 2 positions shown · non-contrast
Comparison: 04/02/2011

CLINICAL DATA: Chest pain and tightness

EXAM:
CHEST  2 VIEW

[w chest pa]
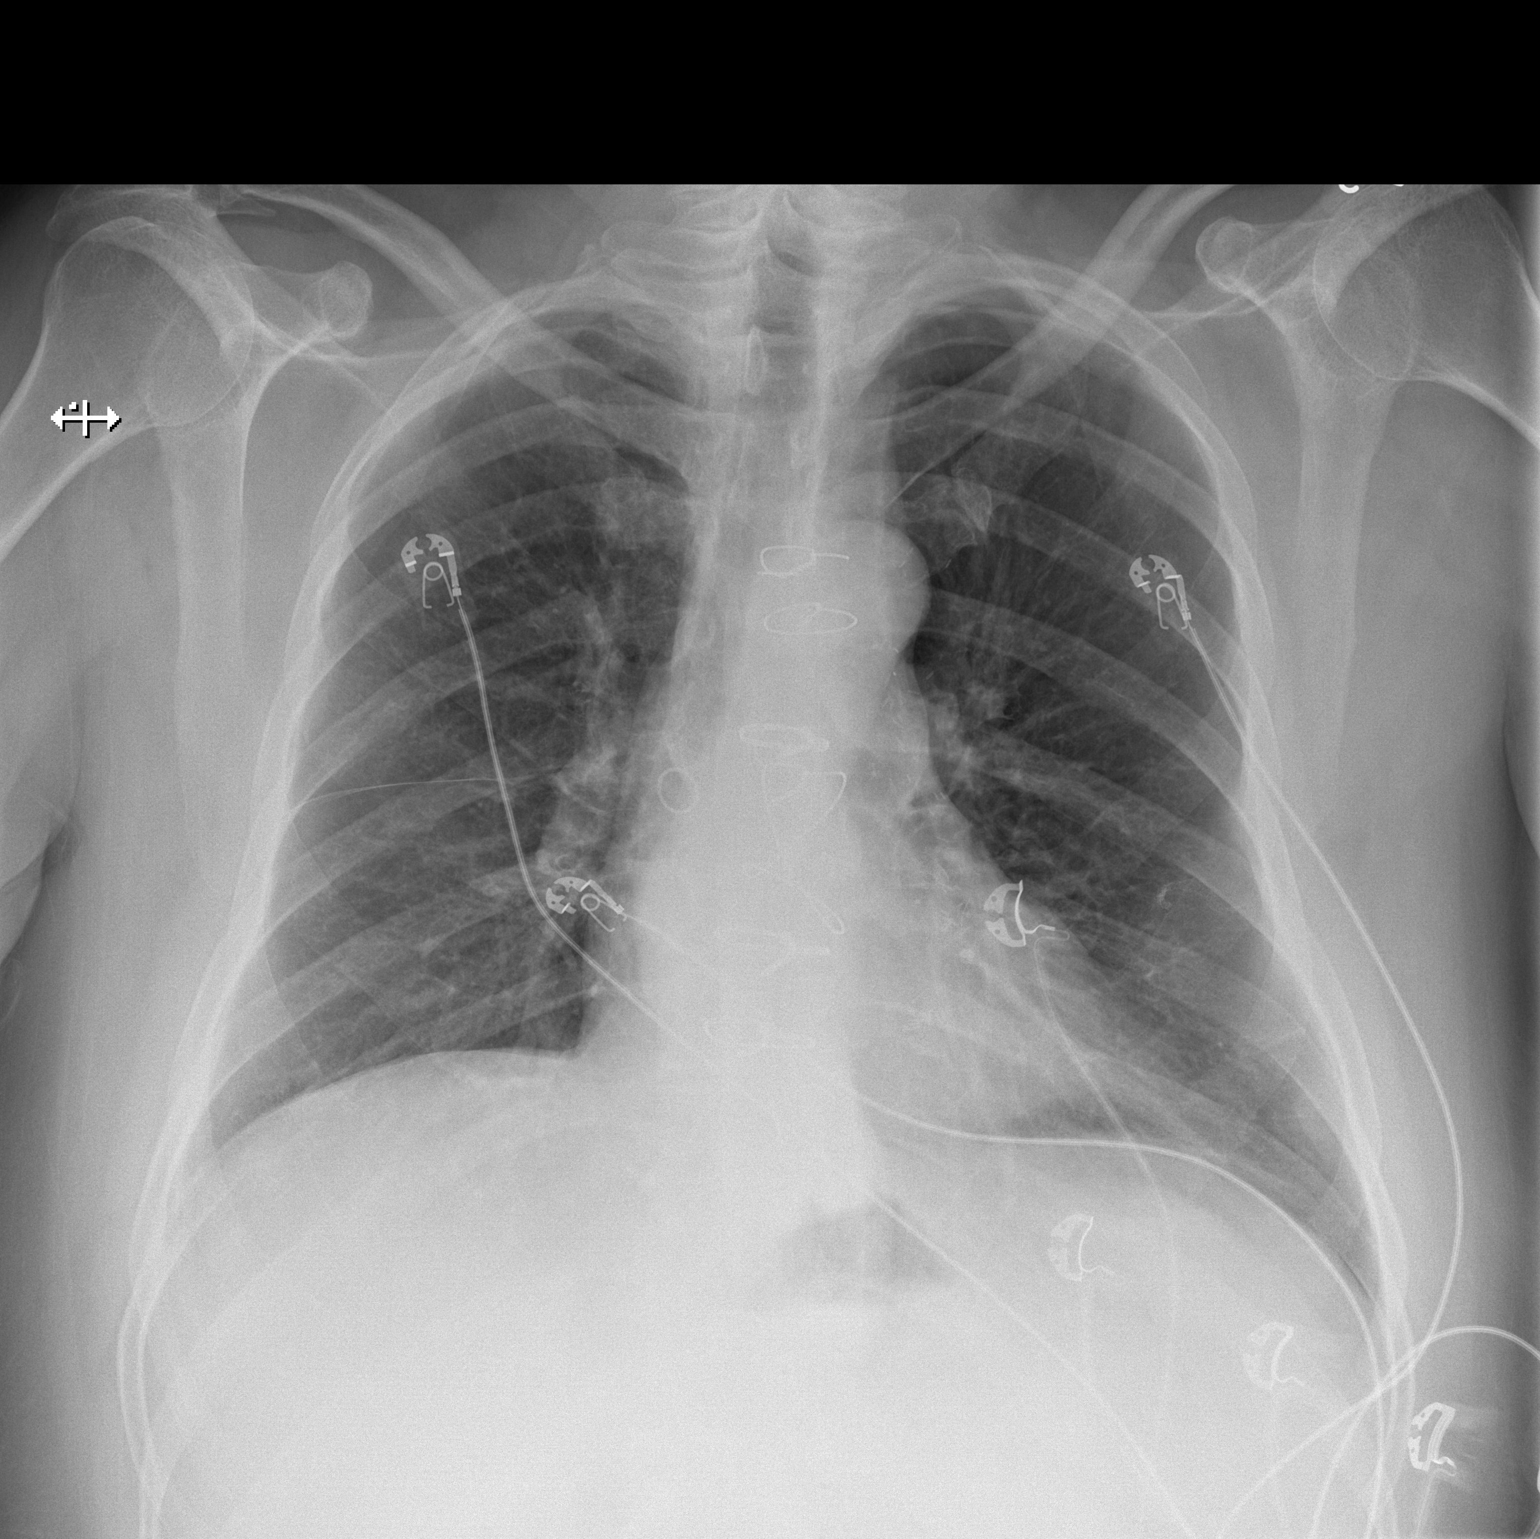

[w chest lat]
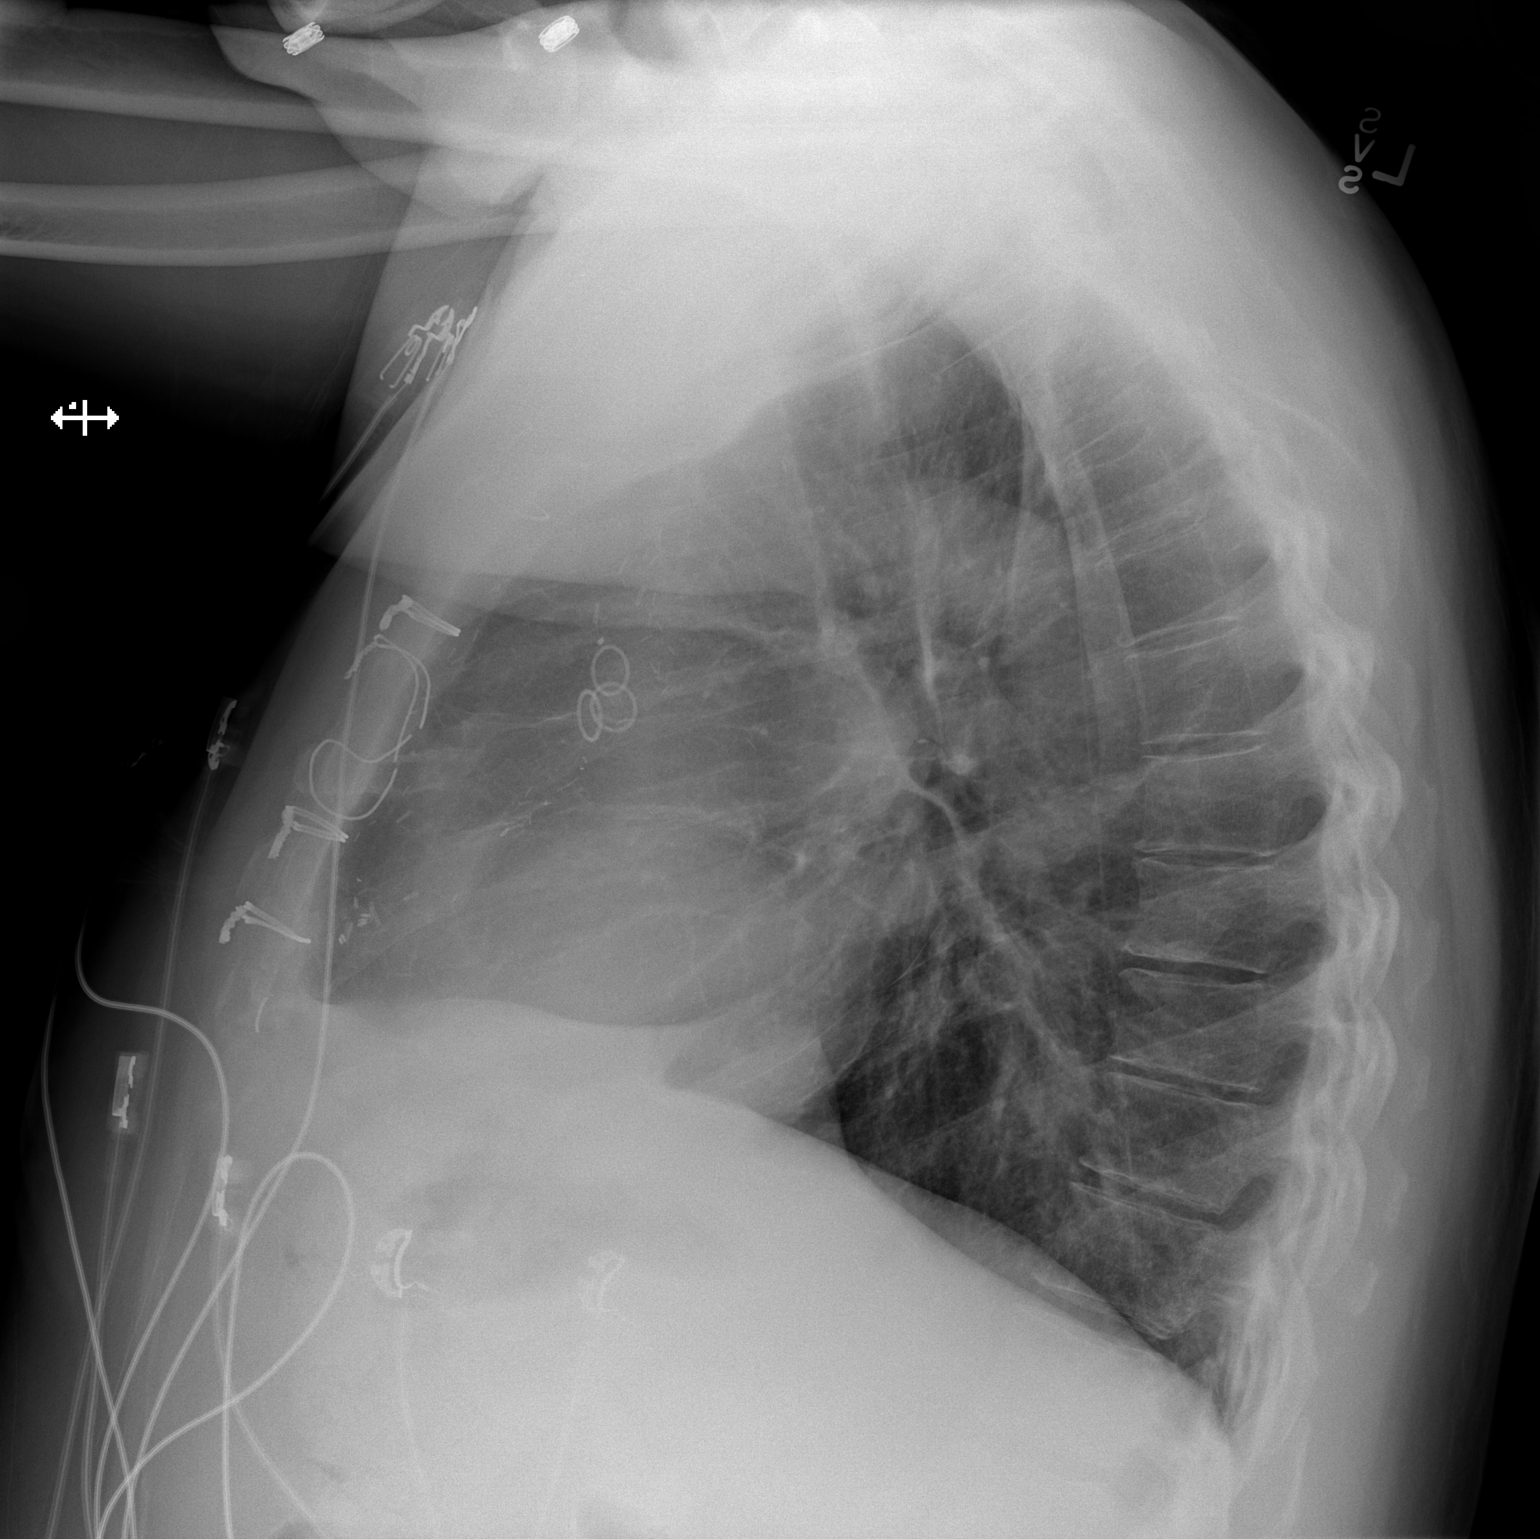

[2 of 2 positions shown; findings below may reference images not displayed]

FINDINGS: Midline sternotomy. Normal mediastinum and cardiac silhouette.
Normal pulmonary vasculature. No evidence of effusion, infiltrate,
or pneumothorax. No acute bony abnormality.
IMPRESSION: No acute cardiopulmonary process.

## 2016-08-02 DIAGNOSIS — R404 Transient alteration of awareness: Secondary | ICD-10-CM | POA: Diagnosis not present

## 2016-08-02 DIAGNOSIS — R531 Weakness: Secondary | ICD-10-CM | POA: Diagnosis not present

## 2016-08-23 DIAGNOSIS — J069 Acute upper respiratory infection, unspecified: Secondary | ICD-10-CM | POA: Diagnosis not present

## 2016-08-23 DIAGNOSIS — H01001 Unspecified blepharitis right upper eyelid: Secondary | ICD-10-CM | POA: Diagnosis not present

## 2016-08-23 DIAGNOSIS — R0981 Nasal congestion: Secondary | ICD-10-CM | POA: Diagnosis not present

## 2016-10-08 ENCOUNTER — Inpatient Hospital Stay (HOSPITAL_COMMUNITY)
Admission: EM | Admit: 2016-10-08 | Discharge: 2016-10-11 | DRG: 247 | Disposition: A | Discharge status: Home Health | Payer: 59 | Attending: Cardiology | Admitting: Cardiology

## 2016-10-08 ENCOUNTER — Emergency Department (HOSPITAL_COMMUNITY): Payer: 59

## 2016-10-08 ENCOUNTER — Encounter (HOSPITAL_COMMUNITY): Payer: Self-pay | Admitting: Emergency Medicine

## 2016-10-08 DIAGNOSIS — G8929 Other chronic pain: Secondary | ICD-10-CM | POA: Diagnosis present

## 2016-10-08 DIAGNOSIS — I25700 Atherosclerosis of coronary artery bypass graft(s), unspecified, with unstable angina pectoris: Secondary | ICD-10-CM | POA: Diagnosis present

## 2016-10-08 DIAGNOSIS — E785 Hyperlipidemia, unspecified: Secondary | ICD-10-CM | POA: Diagnosis present

## 2016-10-08 DIAGNOSIS — I129 Hypertensive chronic kidney disease with stage 1 through stage 4 chronic kidney disease, or unspecified chronic kidney disease: Secondary | ICD-10-CM | POA: Diagnosis present

## 2016-10-08 DIAGNOSIS — N183 Chronic kidney disease, stage 3 unspecified: Secondary | ICD-10-CM | POA: Diagnosis present

## 2016-10-08 DIAGNOSIS — Z7901 Long term (current) use of anticoagulants: Secondary | ICD-10-CM

## 2016-10-08 DIAGNOSIS — Z7951 Long term (current) use of inhaled steroids: Secondary | ICD-10-CM

## 2016-10-08 DIAGNOSIS — Z79899 Other long term (current) drug therapy: Secondary | ICD-10-CM

## 2016-10-08 DIAGNOSIS — I4510 Unspecified right bundle-branch block: Secondary | ICD-10-CM | POA: Diagnosis present

## 2016-10-08 DIAGNOSIS — I2583 Coronary atherosclerosis due to lipid rich plaque: Secondary | ICD-10-CM

## 2016-10-08 DIAGNOSIS — K5909 Other constipation: Secondary | ICD-10-CM | POA: Diagnosis present

## 2016-10-08 DIAGNOSIS — K760 Fatty (change of) liver, not elsewhere classified: Secondary | ICD-10-CM | POA: Diagnosis present

## 2016-10-08 DIAGNOSIS — Z85118 Personal history of other malignant neoplasm of bronchus and lung: Secondary | ICD-10-CM

## 2016-10-08 DIAGNOSIS — I452 Bifascicular block: Secondary | ICD-10-CM | POA: Diagnosis present

## 2016-10-08 DIAGNOSIS — I444 Left anterior fascicular block: Secondary | ICD-10-CM | POA: Diagnosis present

## 2016-10-08 DIAGNOSIS — M1A9XX0 Chronic gout, unspecified, without tophus (tophi): Secondary | ICD-10-CM | POA: Diagnosis present

## 2016-10-08 DIAGNOSIS — Z8249 Family history of ischemic heart disease and other diseases of the circulatory system: Secondary | ICD-10-CM

## 2016-10-08 DIAGNOSIS — I214 Non-ST elevation (NSTEMI) myocardial infarction: Principal | ICD-10-CM | POA: Diagnosis present

## 2016-10-08 DIAGNOSIS — N400 Enlarged prostate without lower urinary tract symptoms: Secondary | ICD-10-CM | POA: Diagnosis present

## 2016-10-08 DIAGNOSIS — Z887 Allergy status to serum and vaccine status: Secondary | ICD-10-CM

## 2016-10-08 DIAGNOSIS — R05 Cough: Secondary | ICD-10-CM | POA: Diagnosis not present

## 2016-10-08 DIAGNOSIS — Z905 Acquired absence of kidney: Secondary | ICD-10-CM

## 2016-10-08 DIAGNOSIS — E559 Vitamin D deficiency, unspecified: Secondary | ICD-10-CM | POA: Diagnosis present

## 2016-10-08 DIAGNOSIS — I10 Essential (primary) hypertension: Secondary | ICD-10-CM | POA: Diagnosis not present

## 2016-10-08 DIAGNOSIS — I481 Persistent atrial fibrillation: Secondary | ICD-10-CM | POA: Diagnosis present

## 2016-10-08 DIAGNOSIS — I25110 Atherosclerotic heart disease of native coronary artery with unstable angina pectoris: Secondary | ICD-10-CM | POA: Diagnosis not present

## 2016-10-08 DIAGNOSIS — I714 Abdominal aortic aneurysm, without rupture: Secondary | ICD-10-CM | POA: Diagnosis present

## 2016-10-08 DIAGNOSIS — R001 Bradycardia, unspecified: Secondary | ICD-10-CM | POA: Diagnosis present

## 2016-10-08 DIAGNOSIS — Z794 Long term (current) use of insulin: Secondary | ICD-10-CM

## 2016-10-08 DIAGNOSIS — I2510 Atherosclerotic heart disease of native coronary artery without angina pectoris: Secondary | ICD-10-CM | POA: Diagnosis present

## 2016-10-08 DIAGNOSIS — I200 Unstable angina: Secondary | ICD-10-CM

## 2016-10-08 DIAGNOSIS — Z955 Presence of coronary angioplasty implant and graft: Secondary | ICD-10-CM

## 2016-10-08 DIAGNOSIS — Z85528 Personal history of other malignant neoplasm of kidney: Secondary | ICD-10-CM

## 2016-10-08 DIAGNOSIS — Z79891 Long term (current) use of opiate analgesic: Secondary | ICD-10-CM

## 2016-10-08 DIAGNOSIS — R079 Chest pain, unspecified: Secondary | ICD-10-CM | POA: Diagnosis not present

## 2016-10-08 DIAGNOSIS — I480 Paroxysmal atrial fibrillation: Secondary | ICD-10-CM | POA: Diagnosis present

## 2016-10-08 DIAGNOSIS — E1122 Type 2 diabetes mellitus with diabetic chronic kidney disease: Secondary | ICD-10-CM | POA: Diagnosis present

## 2016-10-08 DIAGNOSIS — E039 Hypothyroidism, unspecified: Secondary | ICD-10-CM | POA: Diagnosis present

## 2016-10-08 DIAGNOSIS — E782 Mixed hyperlipidemia: Secondary | ICD-10-CM | POA: Diagnosis present

## 2016-10-08 DIAGNOSIS — Z7982 Long term (current) use of aspirin: Secondary | ICD-10-CM

## 2016-10-08 LAB — RAPID URINE DRUG SCREEN, HOSP PERFORMED
Amphetamines: NOT DETECTED
Barbiturates: NOT DETECTED
Benzodiazepines: NOT DETECTED
Cocaine: NOT DETECTED
Opiates: NOT DETECTED
Tetrahydrocannabinol: NOT DETECTED

## 2016-10-08 LAB — I-STAT TROPONIN, ED: Troponin i, poc: 0.4 ng/mL (ref 0.00–0.08)

## 2016-10-08 LAB — CBC
HCT: 41.9 % (ref 39.0–52.0)
Hemoglobin: 13.9 g/dL (ref 13.0–17.0)
MCH: 32.6 pg (ref 26.0–34.0)
MCHC: 33.2 g/dL (ref 30.0–36.0)
MCV: 98.1 fL (ref 78.0–100.0)
Platelets: 195 10*3/uL (ref 150–400)
RBC: 4.27 MIL/uL (ref 4.22–5.81)
RDW: 13.2 % (ref 11.5–15.5)
WBC: 6.2 10*3/uL (ref 4.0–10.5)

## 2016-10-08 LAB — TSH: TSH: 2.09 u[IU]/mL (ref 0.350–4.500)

## 2016-10-08 LAB — PROTIME-INR
INR: 1.28
INR: 1.22
Prothrombin Time: 16.1 seconds — ABNORMAL HIGH (ref 11.4–15.2)
Prothrombin Time: 15.5 seconds — ABNORMAL HIGH (ref 11.4–15.2)

## 2016-10-08 LAB — BASIC METABOLIC PANEL
Anion gap: 9 (ref 5–15)
BUN: 16 mg/dL (ref 6–20)
CO2: 26 mmol/L (ref 22–32)
Calcium: 9 mg/dL (ref 8.9–10.3)
Chloride: 101 mmol/L (ref 101–111)
Creatinine, Ser: 1.79 mg/dL — ABNORMAL HIGH (ref 0.61–1.24)
GFR calc Af Amer: 42 mL/min — ABNORMAL LOW (ref >60)
GFR calc non Af Amer: 36 mL/min — ABNORMAL LOW (ref >60)
Glucose, Bld: 170 mg/dL — ABNORMAL HIGH (ref 65–99)
Potassium: 4.2 mmol/L (ref 3.5–5.1)
Sodium: 136 mmol/L (ref 135–145)

## 2016-10-08 LAB — TROPONIN I: Troponin I: 0.5 ng/mL (ref <0.03)

## 2016-10-08 LAB — HEPARIN LEVEL (UNFRACTIONATED): Heparin Unfractionated: >2.2 IU/mL — ABNORMAL HIGH (ref 0.30–0.70)

## 2016-10-08 LAB — MRSA PCR SCREENING: MRSA by PCR: NEGATIVE

## 2016-10-08 LAB — APTT: aPTT: 45 seconds — ABNORMAL HIGH (ref 24–36)

## 2016-10-08 LAB — MAGNESIUM: MAGNESIUM: 1.8 mg/dL (ref 1.7–2.4)

## 2016-10-08 MED ORDER — INSULIN ASPART 100 UNIT/ML ~~LOC~~ SOLN
0.0000 [IU] | SUBCUTANEOUS | Status: DC
  Administered 2016-10-09: 2 [IU] via SUBCUTANEOUS
  Administered 2016-10-09: 3 [IU] via SUBCUTANEOUS
  Administered 2016-10-09 (×4): 2 [IU] via SUBCUTANEOUS
  Administered 2016-10-10: 19:00:00 1 [IU] via SUBCUTANEOUS

## 2016-10-08 MED ORDER — SODIUM CHLORIDE 0.9% FLUSH
3.0000 mL | Freq: Two times a day (BID) | INTRAVENOUS | Status: DC
  Administered 2016-10-09 (×2): 3 mL via INTRAVENOUS

## 2016-10-08 MED ORDER — SODIUM CHLORIDE 0.9 % IV SOLN: 250.0000 mL | INTRAVENOUS | Status: DC | PRN

## 2016-10-08 MED ORDER — INSULIN DETEMIR 100 UNIT/ML ~~LOC~~ SOLN
30.0000 [IU] | Freq: Two times a day (BID) | SUBCUTANEOUS | Status: DC
  Administered 2016-10-09 – 2016-10-11 (×4): 30 [IU] via SUBCUTANEOUS
  Filled 2016-10-08 (×5): qty 0.3

## 2016-10-08 MED ORDER — MAGNESIUM OXIDE 400 (241.3 MG) MG PO TABS
400.0000 mg | ORAL_TABLET | Freq: Every day | ORAL | Status: DC
  Administered 2016-10-09 – 2016-10-11 (×3): 400 mg via ORAL
  Filled 2016-10-08 (×3): qty 1

## 2016-10-08 MED ORDER — LEVOTHYROXINE SODIUM 100 MCG PO TABS
100.0000 ug | ORAL_TABLET | Freq: Every day | ORAL | Status: DC
  Administered 2016-10-09 – 2016-10-11 (×2): 100 ug via ORAL
  Filled 2016-10-08 (×2): qty 1

## 2016-10-08 MED ORDER — FERROUS SULFATE 325 (65 FE) MG PO TABS
325.0000 mg | ORAL_TABLET | Freq: Two times a day (BID) | ORAL | Status: DC
  Administered 2016-10-09 – 2016-10-11 (×4): 325 mg via ORAL
  Filled 2016-10-08 (×4): qty 1

## 2016-10-08 MED ORDER — GABAPENTIN 300 MG PO CAPS
300.0000 mg | ORAL_CAPSULE | Freq: Two times a day (BID) | ORAL | Status: DC
  Administered 2016-10-09 – 2016-10-11 (×5): 300 mg via ORAL
  Filled 2016-10-08 (×5): qty 1

## 2016-10-08 MED ORDER — AMIODARONE HCL 200 MG PO TABS
200.0000 mg | ORAL_TABLET | Freq: Every day | ORAL | Status: DC
  Administered 2016-10-09 – 2016-10-11 (×3): 200 mg via ORAL
  Filled 2016-10-08 (×4): qty 1

## 2016-10-08 MED ORDER — NITROGLYCERIN 0.4 MG SL SUBL: 0.4000 mg | SUBLINGUAL_TABLET | SUBLINGUAL | Status: DC | PRN

## 2016-10-08 MED ORDER — INSULIN ASPART 100 UNIT/ML ~~LOC~~ SOLN: 0.0000 [IU] | Freq: Every day | SUBCUTANEOUS | Status: DC

## 2016-10-08 MED ORDER — ASPIRIN 81 MG PO CHEW
324.0000 mg | CHEWABLE_TABLET | Freq: Once | ORAL | Status: AC
  Administered 2016-10-08: 324 mg via ORAL
  Filled 2016-10-08: qty 4

## 2016-10-08 MED ORDER — OMEGA-3-ACID ETHYL ESTERS 1 G PO CAPS
1.0000 g | ORAL_CAPSULE | Freq: Every day | ORAL | Status: DC
  Administered 2016-10-09 – 2016-10-11 (×3): 1 g via ORAL
  Filled 2016-10-08 (×3): qty 1

## 2016-10-08 MED ORDER — NITROGLYCERIN 2 % TD OINT
1.0000 [in_us] | TOPICAL_OINTMENT | Freq: Once | TRANSDERMAL | Status: AC
  Administered 2016-10-08: 1 [in_us] via TOPICAL
  Filled 2016-10-08: qty 1

## 2016-10-08 MED ORDER — BUPROPION HCL ER (XL) 150 MG PO TB24
150.0000 mg | ORAL_TABLET | Freq: Every morning | ORAL | Status: DC
  Administered 2016-10-09 – 2016-10-11 (×2): 150 mg via ORAL
  Filled 2016-10-08 (×3): qty 1

## 2016-10-08 MED ORDER — INSULIN ASPART 100 UNIT/ML ~~LOC~~ SOLN
10.0000 [IU] | Freq: Three times a day (TID) | SUBCUTANEOUS | Status: DC
  Administered 2016-10-09 (×3): 10 [IU] via SUBCUTANEOUS

## 2016-10-08 MED ORDER — HEPARIN (PORCINE) IN NACL 100-0.45 UNIT/ML-% IJ SOLN
1150.0000 [IU]/h | INTRAMUSCULAR | Status: DC
  Administered 2016-10-08: 1350 [IU]/h via INTRAVENOUS
  Administered 2016-10-09: 1150 [IU]/h via INTRAVENOUS
  Administered 2016-10-09: 1250 [IU]/h via INTRAVENOUS
  Filled 2016-10-08 (×2): qty 250

## 2016-10-08 MED ORDER — LINACLOTIDE 145 MCG PO CAPS
145.0000 ug | ORAL_CAPSULE | Freq: Every day | ORAL | Status: DC | PRN
  Filled 2016-10-08: qty 1

## 2016-10-08 MED ORDER — HYDROXYZINE HCL 50 MG PO TABS
50.0000 mg | ORAL_TABLET | Freq: Every day | ORAL | Status: DC
  Administered 2016-10-09 – 2016-10-10 (×3): 50 mg via ORAL
  Filled 2016-10-08 (×3): qty 1

## 2016-10-08 MED ORDER — ISOSORBIDE MONONITRATE ER 30 MG PO TB24
30.0000 mg | ORAL_TABLET | Freq: Every day | ORAL | Status: DC
  Administered 2016-10-09 – 2016-10-11 (×3): 30 mg via ORAL
  Filled 2016-10-08 (×3): qty 1

## 2016-10-08 MED ORDER — PANTOPRAZOLE SODIUM 40 MG PO TBEC
40.0000 mg | DELAYED_RELEASE_TABLET | Freq: Two times a day (BID) | ORAL | Status: DC
  Administered 2016-10-09 – 2016-10-11 (×5): 40 mg via ORAL
  Filled 2016-10-08 (×5): qty 1

## 2016-10-08 MED ORDER — ZOLPIDEM TARTRATE 5 MG PO TABS
5.0000 mg | ORAL_TABLET | Freq: Every evening | ORAL | Status: DC | PRN
  Administered 2016-10-09: 5 mg via ORAL
  Filled 2016-10-08: qty 1

## 2016-10-08 MED ORDER — OXYCODONE-ACETAMINOPHEN 10-325 MG PO TABS: 1.0000 | ORAL_TABLET | ORAL | Status: DC | PRN

## 2016-10-08 MED ORDER — SODIUM CHLORIDE 0.9 % IV SOLN: INTRAVENOUS | Status: DC

## 2016-10-08 MED ORDER — SODIUM CHLORIDE 0.9 % WEIGHT BASED INFUSION
1.0000 mL/kg/h | INTRAVENOUS | Status: DC
Start: 2016-10-09 — End: 2016-10-10
  Administered 2016-10-09: 1 mL/kg/h via INTRAVENOUS

## 2016-10-08 MED ORDER — ACETAMINOPHEN 325 MG PO TABS: 650.0000 mg | ORAL_TABLET | ORAL | Status: DC | PRN

## 2016-10-08 MED ORDER — NITROGLYCERIN 0.4 MG SL SUBL
0.4000 mg | SUBLINGUAL_TABLET | SUBLINGUAL | Status: DC | PRN
  Administered 2016-10-09 (×3): 0.4 mg via SUBLINGUAL
  Filled 2016-10-08: qty 1

## 2016-10-08 MED ORDER — ALPRAZOLAM 0.25 MG PO TABS
0.2500 mg | ORAL_TABLET | Freq: Two times a day (BID) | ORAL | Status: DC | PRN
  Administered 2016-10-10 – 2016-10-11 (×2): 0.25 mg via ORAL
  Filled 2016-10-08 (×2): qty 1

## 2016-10-08 MED ORDER — ASPIRIN 81 MG PO CHEW: 324.0000 mg | CHEWABLE_TABLET | ORAL | Status: DC

## 2016-10-08 MED ORDER — VITAMIN D 1000 UNITS PO TABS
4000.0000 [IU] | ORAL_TABLET | Freq: Three times a day (TID) | ORAL | Status: DC
  Administered 2016-10-09 – 2016-10-10 (×6): 4000 [IU] via ORAL
  Filled 2016-10-08 (×7): qty 4

## 2016-10-08 MED ORDER — ALBUTEROL SULFATE (2.5 MG/3ML) 0.083% IN NEBU
3.0000 mL | INHALATION_SOLUTION | Freq: Four times a day (QID) | RESPIRATORY_TRACT | Status: DC | PRN
  Filled 2016-10-08: qty 3

## 2016-10-08 MED ORDER — POLYETHYLENE GLYCOL 3350 17 G PO PACK: 17.0000 g | PACK | Freq: Every day | ORAL | Status: DC | PRN

## 2016-10-08 MED ORDER — ATORVASTATIN CALCIUM 80 MG PO TABS
80.0000 mg | ORAL_TABLET | Freq: Every day | ORAL | Status: DC
  Administered 2016-10-09 – 2016-10-10 (×2): 80 mg via ORAL
  Filled 2016-10-08 (×2): qty 1

## 2016-10-08 MED ORDER — ASPIRIN 300 MG RE SUPP: 300.0000 mg | RECTAL | Status: DC

## 2016-10-08 MED ORDER — SODIUM CHLORIDE 0.9% FLUSH: 3.0000 mL | INTRAVENOUS | Status: DC | PRN

## 2016-10-08 MED ORDER — ASPIRIN EC 81 MG PO TBEC
81.0000 mg | DELAYED_RELEASE_TABLET | Freq: Every day | ORAL | Status: DC
  Administered 2016-10-09: 81 mg via ORAL
  Filled 2016-10-08: qty 1

## 2016-10-08 MED ORDER — ONDANSETRON HCL 4 MG/2ML IJ SOLN: 4.0000 mg | Freq: Four times a day (QID) | INTRAMUSCULAR | Status: DC | PRN

## 2016-10-08 MED ORDER — METOPROLOL TARTRATE 12.5 MG HALF TABLET
12.5000 mg | ORAL_TABLET | Freq: Two times a day (BID) | ORAL | Status: DC
  Administered 2016-10-10 – 2016-10-11 (×3): 12.5 mg via ORAL
  Filled 2016-10-08 (×4): qty 1

## 2016-10-08 MED ORDER — INSULIN ASPART 100 UNIT/ML ~~LOC~~ SOLN: 0.0000 [IU] | Freq: Three times a day (TID) | SUBCUTANEOUS | Status: DC

## 2016-10-08 MED ORDER — FENOFIBRATE 54 MG PO TABS
54.0000 mg | ORAL_TABLET | Freq: Every day | ORAL | Status: DC
  Administered 2016-10-09: 54 mg via ORAL
  Filled 2016-10-08 (×3): qty 1

## 2016-10-08 MED ORDER — ASPIRIN 81 MG PO CHEW
81.0000 mg | CHEWABLE_TABLET | ORAL | Status: AC
Start: 2016-10-09 — End: 2016-10-09
  Administered 2016-10-09: 81 mg via ORAL
  Filled 2016-10-08: qty 1

## 2016-10-08 MED ORDER — SODIUM CHLORIDE 0.9 % WEIGHT BASED INFUSION
3.0000 mL/kg/h | INTRAVENOUS | Status: AC
  Administered 2016-10-09: 3 mL/kg/h via INTRAVENOUS

## 2016-10-08 NOTE — ED Provider Notes (Addendum)
[facility] DEPT Provider Note   CSN: [id number] Arrival date & time: 10/08/16  1448  By signing my name below, I, Dora Sims, attest that this documentation has been prepared under the direction and in the presence of physician practitioner, Virgel Manifold, MD. Electronically Signed: Dora Sims, Scribe. 10/08/2016. 4:14 PM.  History   Chief Complaint Chief Complaint  Patient presents with  . Chest Pain   The history is provided by the patient. No language interpreter was used.    HPI Comments: Matthew [name] is a 71 y.o. male anticoagulated with Eliquis, with PMHx including A-Fib with RVR, CAD, CKD, DM, HTN, HLD,  who presents to the Emergency Department complaining of intermittent, aching, non-radiating, left-sided chest pain for three days. He reports his pain worsened today while walking to his mailbox and is present currently. Pt endorses some occasional nausea and shortness of breath in association with his chest pain. Prior to today, he used nitroglycerin with improvement of his chest pain but notes it has not provided any improvement today. Patient has been taking his regular medications as prescribed without any missed doses. Patient has a h/o CABG X4 and notes his current symptoms are not exactly the same as those which he experienced prior to his CABG's. Patient denies leg swelling, vomiting, or any other associated symptoms. He is followed by cardiology.  Past Medical History:  Diagnosis Date  . AAA (abdominal aortic aneurysm) ([place])    a. Aortic US 05/2012: 3.9 cm fusiform abdominal aortic aneurysm. F/u recommended 05/2014.  Marland Kitchen Anemia   . Atrial fibrillation ([place])    a. Post-op CABG 05/2009. b. Re-identified 12/2013.  Marland Kitchen CAD (coronary artery disease)    a. s/p CABGx4 05/2009.  Marland Kitchen Chronic constipation   . CKD (chronic kidney disease), stage III   . Cocaine abuse in remission   . Colon polyps   . Diabetes mellitus   . Empyema lung ([place])    a. PNA 02/2011 c/b  empyema requiring chest tube, drainage of pleural effusion and decortication.  . Enlarged prostate   . Hepatic steatosis   . Hyperlipidemia   . Hypertension   . Hypothyroidism   . Iron deficiency anemia   . LAFB (left anterior fascicular block)   . Lung cancer ([place])    a. Tx with interleukin in 2002 per patient.  . Nodule of left lung    a. Cavitary nodules 02/2011, f/u recommended.  . Obesity   . Paroxysmal supraventricular tachycardia ([place])    a. While inpatient 02/2011 sick with empyema - wide appearace due to RBBB.  . RBBB   . Renal cell carcinoma    a. s/p R nephrectomy 2002. Reported pulmonary mets per note at that time.  . Scrotal abscess    a. 2012 2/2 Streptococcus group C, status post incision and drainage.  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  . Vitamin D deficiency     Patient Active Problem List   Diagnosis Date Noted  . Atrial fibrillation with RVR ([place]) 01/26/2016  . Atrial fibrillation with rapid ventricular response ([place]) 01/26/2016  . PAF (paroxysmal atrial fibrillation) ([place]) 12/21/2013  . Chronic abdominal pain 12/21/2013  . CKD (chronic kidney disease), stage III 12/21/2013  . H/O: lung cancer 12/21/2013  . LV dysfunction 12/21/2013  . RBBB   . LAFB (left anterior fascicular block)   . AAA (abdominal aortic aneurysm) ([place])   . Chest pain 12/20/2013  . Mixed hyperlipidemia 10/14/2013  .  Obesity, unspecified 10/14/2013  . Right Empyema    .  Left upper lobe lesion or nodule   . Hypertension   . CAD (coronary artery disease)   . Renal cell carcinoma ([place])   . [place]   . Paroxysmal supraventricular tachycardia ([place])   . Hypothyroidism     Past Surgical History:  Procedure Laterality Date  . Bronchoscopy, right video-assisted thoracoscopy, minithoracotomy, drainage of pleural effusion, and decortication.  02/26/2011   [facility]  . CABG x 4  05/13/2009   [facility]  . CARDIAC SURGERY    . HEMORRHOID SURGERY    .  HERNIA REPAIR    . Incision, drainage and debridement of perineum and  scrotum    . Irrigation and debridement of scrotal wound and closure of scrotal wound.    . Right radical nephrectomy.    . TOOTH EXTRACTION         Home Medications    Prior to Admission medications   Medication Sig Start Date End Date Taking? Authorizing Provider  albuterol (PROVENTIL HFA;VENTOLIN HFA) 108 (90 BASE) MCG/ACT inhaler Inhale 2 puffs into the lungs every 6 (six) hours as needed for wheezing or shortness of breath.    Yes Historical Provider, MD  amiodarone (PACERONE) 200 MG tablet Take 1 tablet (200 mg total) by mouth daily. 02/21/16  Yes Brittainy Erie Noe, PA-C  apixaban (ELIQUIS) 5 MG TABS tablet Take 5 mg by mouth 2 (two) times daily.   Yes Historical Provider, MD  atorvastatin (LIPITOR) 80 MG tablet Take 80 mg by mouth daily at 6 PM.   Yes Historical Provider, MD  buPROPion (WELLBUTRIN XL) 150 MG 24 hr tablet Take 150 mg by mouth every morning.   Yes Historical Provider, MD  capsaicin (ZOSTRIX) 0.025 % cream Apply 1 application topically 2 (two) times daily as needed (for pain).    Yes Historical Provider, MD  Cholecalciferol 4000 UNITS CAPS Take 4,000 Units by mouth 3 (three) times daily.   Yes Historical Provider, MD  clotrimazole (LOTRIMIN) 1 % cream Apply 1 application topically 2 (two) times daily as needed (for rash).    Yes Historical Provider, MD  fenofibrate 54 MG tablet TAKE 1 TABLET (54 MG TOTAL) BY MOUTH DAILY. 05/23/16  Yes Troy Sine, MD  ferrous sulfate dried (SLOW FE) 160 (50 FE) MG TBCR Take 160 mg by mouth 2 (two) times daily with a meal.     Yes Historical Provider, MD  gabapentin (NEURONTIN) 300 MG capsule Take 300 mg by mouth 2 times daily at 12 noon and 4 pm.     Yes Historical Provider, MD  glucose 4 GM chewable tablet Chew 1 tablet by mouth as needed for low blood sugar.   Yes Historical Provider, MD  hydrocortisone-pramoxine [facility]) 2.5-1 % rectal cream Place 1  application rectally 3 (three) times daily as needed for hemorrhoids or itching.    Yes Historical Provider, MD  hydrOXYzine (VISTARIL) 25 MG capsule Take 50 mg by mouth at bedtime.  10/10/14  Yes Historical Provider, MD  ibuprofen (ADVIL,MOTRIN) 800 MG tablet Take 800 mg by mouth every 6 (six) hours as needed for moderate pain.   Yes Historical Provider, MD  Insulin Detemir (LEVEMIR FLEXPEN) 100 UNIT/ML Pen Inject 60 Units into the skin 2 (two) times daily.    Yes Historical Provider, MD  insulin lispro (HUMALOG KWIKPEN) 100 UNIT/ML KiwkPen Inject 24 Units into the skin 3 (three) times daily.    Yes Historical Provider, MD  isosorbide mononitrate (IMDUR)  30 MG 24 hr tablet Take 30 mg by mouth daily.   Yes Historical Provider, MD  ketoconazole (NIZORAL) 2 % cream Apply 1 application topically daily. For rash   Yes Historical Provider, MD  levothyroxine (SYNTHROID, LEVOTHROID) 100 MCG tablet Take 100 mcg by mouth daily before breakfast.    Yes Historical Provider, MD  LIDOCAINE EX Apply 1 application topically 2 (two) times daily as needed (for pain).    Yes Historical Provider, MD  Linaclotide (LINZESS) 145 MCG CAPS capsule Take 145 mcg by mouth daily as needed (diarrhea).    Yes Historical Provider, MD  Magnesium Oxide 420 MG TABS Take 420 mg by mouth daily.    Yes Historical Provider, MD  metoprolol tartrate (LOPRESSOR) 25 MG tablet Take 0.5 tablets (12.5 mg total) by mouth 2 (two) times daily. 01/27/16  Yes Cheryln Manly, NP  mupirocin ointment (BACTROBAN) 2 % Place 1 application into the nose daily as needed (for mersa).    Yes Historical Provider, MD  nitroGLYCERIN (NITROSTAT) 0.4 MG SL tablet Place 0.4 mg under the tongue every 5 (five) minutes as needed for chest pain (x 3 doses daily).    Yes Historical Provider, MD  Omega-3 Fatty Acids (FISH OIL) 1000 MG CAPS Take 1,000 mg by mouth daily.    Yes Historical Provider, MD  oxyCODONE-acetaminophen (PERCOCET) 10-325 MG per tablet Take 1 tablet by  mouth every 4 (four) hours as needed for pain.   Yes Historical Provider, MD  pantoprazole (PROTONIX) 40 MG tablet Take 1 tablet (40 mg total) by mouth 2 (two) times daily. 10/20/14  Yes Jerene Bears, MD  polyethylene glycol powder (GLYCOLAX/MIRALAX) powder DISSOLVE 17 GRAMS (1 CAPFUL) IN AT LEAST 8 OUNCES WATER/JUICE AND DRINK DAILY. Patient taking differently: DISSOLVE 17 GRAMS (1 CAPFUL) IN AT LEAST 8 OUNCES WATER/JUICE AND DRINK DAILY AS NEEDED FOR CONSTIPATION 10/13/14  Yes Jerene Bears, MD    Family History Family History  Problem Relation Age of Onset  . Heart disease Father   . Alzheimer's disease Mother   . Pneumonia    . Colon cancer Neg Hx   . Esophageal cancer Neg Hx   . Stomach cancer Neg Hx   . Rectal cancer Neg Hx     Social History Social History  Substance Use Topics  . Smoking status: Never Smoker  . Smokeless tobacco: Never Used  . Alcohol use No     Allergies   Tetanus toxoids   Review of Systems Review of Systems  Respiratory: Positive for shortness of breath.   Cardiovascular: Positive for chest pain. Negative for leg swelling.  Gastrointestinal: Positive for nausea. Negative for vomiting.  All other systems reviewed and are negative.  Physical Exam Updated Vital Signs BP 108/73 (BP Location: Right Arm)   Pulse (!) 56   Temp 97.9 F (36.6 C) (Oral)   Resp 19   Ht 6' (1.829 m)   Wt 280 lb (127 kg)   SpO2 97%   BMI 37.97 kg/m   Physical Exam  Constitutional: He appears well-developed and well-nourished.  HENT:  Head: Normocephalic.  Right Ear: External ear normal.  Left Ear: External ear normal.  Nose: Nose normal.  Eyes: Conjunctivae are normal. Right eye exhibits no discharge. Left eye exhibits no discharge.  Neck: Normal range of motion.  Cardiovascular: Normal rate, regular rhythm and normal heart sounds.   No murmur heard. Pulmonary/Chest: Effort normal and breath sounds normal. No respiratory distress. He has no wheezes. He has no  rales.  Abdominal: Soft. There is no tenderness. There is no rebound and no guarding.  Musculoskeletal: Normal range of motion. He exhibits no edema or tenderness.  Neurological: He is alert. No cranial nerve deficit. Coordination normal.  Skin: Skin is warm and dry. No rash noted. No erythema. No pallor.  Psychiatric: He has a normal mood and affect. His behavior is normal.  Nursing note and vitals reviewed.  ED Treatments / Results  Labs (all labs ordered are listed, but only abnormal results are displayed) Labs Reviewed  I-STAT TROPOININ, ED - Abnormal; Notable for the following:       Result Value   Troponin i, poc 0.40 (*)    All other components within normal limits  CBC  BASIC METABOLIC PANEL  RAPID URINE DRUG SCREEN, HOSP PERFORMED    EKG  EKG Interpretation  Date/Time:  Monday October 08 2016 14:54:21 EDT Ventricular Rate:  56 PR Interval:    QRS Duration: 157 QT Interval:  487 QTC Calculation: 470 R Axis:   -93 Text Interpretation:  Sinus rhythm Right bundle branch block Anterolateral infarct, old but change from 01/2016 inferior infarct, old. Questionable STE inferiorly, upsloping baseline hinders interpretation.  Confirmed by Wilson Singer  MD, Annie Main [phone]) on 10/08/2016 3:00:53 PM Also confirmed by Wilson Singer  MD, [place] [id number]), editor [address] [place], Leda Gauze [phone])  on 10/08/2016 3:06:33 PM       Radiology Dg Chest 2 View  Result Date: 10/08/2016 CLINICAL DATA:  Chest pain for 3 days, cough EXAM: CHEST  2 VIEW COMPARISON:  02/17/2016 FINDINGS: Prior CABG. Heart is normal size. Lungs are clear. No effusions. No acute bony abnormality. IMPRESSION: Prior CABG.  No active disease. Electronically Signed   By: Rolm Baptise M.D.   On: 10/08/2016 15:49    Procedures Procedures (including critical care time)  CRITICAL CARE Performed by: Virgel Manifold Total critical care time: 35 minutes Critical care time was exclusive of separately billable procedures and treating other  patients. Critical care was necessary to treat or prevent imminent or life-threatening deterioration. Critical care was time spent personally by me on the following activities: development of treatment plan with patient and/or surrogate as well as nursing, discussions with consultants, evaluation of patient's response to treatment, examination of patient, obtaining history from patient or surrogate, ordering and performing treatments and interventions, ordering and review of laboratory studies, ordering and review of radiographic studies, pulse oximetry and re-evaluation of patient's condition.   DIAGNOSTIC STUDIES: Oxygen Saturation is 97% on RA, normal by my interpretation.    COORDINATION OF CARE: 4:20 PM Discussed treatment plan with pt at bedside and pt agreed to plan.  Medications Ordered in ED Medications - No data to display   Initial Impression / Assessment and Plan / ED Course  I have reviewed the triage vital signs and the nursing notes.  Pertinent labs & imaging results that were available during my care of the patient were reviewed by me and considered in my medical decision making (see chart for details).  71 year old male with symptoms consistent with unstable angina. Known CAD. EKG is concerning, but nondiagnostic for STEMI. ASA/heparin and ntiro. Cardiology consultation. Anticipate transfer to [facility].  Final Clinical Impressions(s) / ED Diagnoses   Final diagnoses:  NSTEMI (non-ST elevated myocardial infarction) ([place])  Unstable angina (H[facility])    New Prescriptions New Prescriptions   No medications on file   I personally preformed the services scribed in my presence. The recorded information has been reviewed is accurate. Virgel Manifold, MD.  Virgel Manifold, MD 10/08/16 1224    Virgel Manifold, MD 10/08/16 [phone]

## 2016-10-08 NOTE — H&P (Signed)
Admit date: 10/08/2016 Referring Physician: Dr. Wilson Singer Primary Cardiologist: Dr. Irish Lack Chief complaint/reason for admission:Chest pain with elevated troponin  HPI: Matthew [name] is a 71 y.o. male who is being seen today for the evaluation of chest pain and elevated troponin at the request of Dr. Wilson Singer.  The patient has a history of ASCAD s/p CABG x 4 in 2010, AAA, persistent atrial fibrillation on Eliquis for CHADS2VASC score of 4, CKD stage III, DM who presented to [facility] ER with complaints of chest pain. He says that over the past few days he has had episodic CP both at rest and with exertion that he described as a severe tightness that would resolve after a SL NTG.  He denies any diaphoresis or SOB with the CP but has had some nausea and belching with the discomfort.  He had an episode of chest discomfort today after walking to the mailbox and didn't even make it there due to severe chest discomfort and he had his son bring him to the ER.  He denies any exertional SOB or DOE, palpitations, dizziness, LE edema or syncope.  He currently is pain free.  Initial trop is elevated at 0.4 with creatinine 1.79, o/w labs ok.  He had some SOB back in 2014 and had a nuclear stress test that showed some mild apical ischemia and was medically managed.  2D echo in 2015 showed EF 40-45% with apical HK.  Cardiology is now asked to admit for NSTEMI.   PMH:    Past Medical History:  Diagnosis Date  . AAA (abdominal aortic aneurysm) ([place])    a. Aortic US 05/2012: 3.9 cm fusiform abdominal aortic aneurysm. F/u recommended 05/2014.  Marland Kitchen Anemia   . Atrial fibrillation ([place])    a. Post-op CABG 05/2009. b. Re-identified 12/2013.  Marland Kitchen CAD (coronary artery disease)    a. s/p CABGx4 05/2009.  Marland Kitchen Chronic constipation   . CKD (chronic kidney disease), stage III   . Cocaine abuse in remission   . Colon polyps   . Diabetes mellitus   . Empyema lung ([place])    a. PNA 02/2011 c/b empyema requiring chest tube, drainage of  pleural effusion and decortication.  . Enlarged prostate   . Hepatic steatosis   . Hyperlipidemia   . Hypertension   . Hypothyroidism   . Iron deficiency anemia   . LAFB (left anterior fascicular block)   . Lung cancer ([place])    a. Tx with interleukin in 2002 per patient.  . Nodule of left lung    a. Cavitary nodules 02/2011, f/u recommended.  . Obesity   . Paroxysmal supraventricular tachycardia ([place])    a. While inpatient 02/2011 sick with empyema - wide appearace due to RBBB.  . RBBB   . Renal cell carcinoma    a. s/p R nephrectomy 2002. Reported pulmonary mets per note at that time.  . Scrotal abscess    a. 2012 2/2 Streptococcus group C, status post incision and drainage.  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  . Vitamin D deficiency     PSH:    Past Surgical History:  Procedure Laterality Date  . Bronchoscopy, right video-assisted thoracoscopy, minithoracotomy, drainage of pleural effusion, and decortication.  02/26/2011   [facility]  . CABG x 4  05/13/2009   [facility]rickson  . CARDIAC SURGERY    . HEMORRHOID SURGERY    . HERNIA REPAIR    . Incision, drainage and debridement of perineum  and  scrotum    . Irrigation and debridement of scrotal wound and closure of scrotal wound.    . Right radical nephrectomy.    . TOOTH EXTRACTION      ALLERGIES:   Tetanus toxoids  Prior to Admit Meds:   (Not in a hospital admission) Family HX:    Family History  Problem Relation Age of Onset  . Heart disease Father   . Alzheimer's disease Mother   . Pneumonia    . Colon cancer Neg Hx   . Esophageal cancer Neg Hx   . Stomach cancer Neg Hx   . Rectal cancer Neg Hx    Social HX:    Social History   Social History  . Marital status: Married    Spouse name: N/A  . Number of children: 4  . Years of education: N/A   Occupational History  . 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 Smoking status: Never Smoker  . Smokeless tobacco:  Never Used  . Alcohol use No  . Drug use: Yes    Types: Cocaine     Comment: stopped cocaine 2 and 1/2 years ago  . Sexual activity: Not on file   Other Topics Concern  . Not on file   Social History Narrative  . No narrative on file     ROS:  All ROS were addressed and are negative except what is stated in the HPI  PHYSICAL EXAM Vitals:   10/08/16 1454 10/08/16 1821  BP: 108/73 109/60  Pulse: (!) 56 (!) 56  Resp: 19 16  Temp: 97.9 F (36.6 C)    General: Well developed, well nourished, in no acute distress Head: Eyes PERRLA, No xanthomas.   Normal cephalic and atramatic  Lungs:   Clear bilaterally to auscultation and percussion. Heart:   HRRR S1 S2 Pulses are 2+ & equal.            No carotid bruit. No JVD.  No abdominal bruits. No femoral bruits. Abdomen: Bowel sounds are positive, abdomen soft and non-tender without masses  Msk:  Back normal, normal gait. Normal strength and tone for age. Extremities:   No clubbing, cyanosis or edema.  DP +1 Neuro: Alert and oriented X 3. Psych:  Good affect, responds appropriately   Labs:   Lab Results  Component Value Date   WBC 6.2 10/08/2016   HGB 13.9 10/08/2016   HCT 41.9 10/08/2016   MCV 98.1 10/08/2016   PLT 195 10/08/2016    Recent Labs Lab 10/08/16 1454  NA 136  K 4.2  CL 101  CO2 26  BUN 16  CREATININE 1.79*  CALCIUM 9.0  GLUCOSE 170*   Lab Results  Component Value Date   CKTOTAL 38 06/25/2010   CKMB 1.0 06/25/2010   TROPONINI <0.03 01/27/2016   No results found for: PTT Lab Results  Component Value Date   INR 1.28 10/08/2016   INR 1.00 01/26/2016   INR 1.1 (H) 10/20/2014     Lab Results  Component Value Date   CHOL 237 (H) 12/18/2013   CHOL  06/25/2010    132        ATP III CLASSIFICATION:  <200     mg/dL   Desirable  200-239  mg/dL   Borderline High  >=240    mg/dL   High          Lab Results  Component Value Date   HDL 34.00 (L) 12/18/2013  HDL 22 (L) 06/25/2010   Lab Results    Component Value Date   LDLCALC 131 (H) 12/18/2013   LDLCALC  06/25/2010    87        Total Cholesterol/HDL:CHD Risk Coronary Heart Disease Risk Table                     Men   Women  1/2 Average Risk   3.4   3.3  Average Risk       5.0   4.4  2 X Average Risk   9.6   7.1  3 X Average Risk  23.4   11.0        Use the calculated Patient Ratio above and the CHD Risk Table to determine the patient's CHD Risk.        ATP III CLASSIFICATION (LDL):  <100     mg/dL   Optimal  100-129  mg/dL   Near or Above                    Optimal  130-159  mg/dL   Borderline  160-189  mg/dL   High  >190     mg/dL   Very High   Lab Results  Component Value Date   TRIG 358.0 (H) 12/18/2013   TRIG 116 06/25/2010   Lab Results  Component Value Date   CHOLHDL 7 12/18/2013   CHOLHDL 6.0 06/25/2010   No results found for: LDLDIRECT    Radiology:  Dg Chest 2 View  Result Date: 10/08/2016 CLINICAL DATA:  Chest pain for 3 days, cough EXAM: CHEST  2 VIEW COMPARISON:  02/17/2016 FINDINGS: Prior CABG. Heart is normal size. Lungs are clear. No effusions. No acute bony abnormality. IMPRESSION: Prior CABG.  No active disease. Electronically Signed   By: Rolm Baptise M.D.   On: 10/08/2016 15:49     Telemetry    NSR - Personally Reviewed  ECG    Sinus bradycardia with RBBB and anterolateral infarct - Personally Reviewed   ASSESSMENT/PLAN:   1.  NSTEMI - he currently is pain free but has had intermittent CP both with rest and exertion over the past 3 days associated with nausea.  EKG with RBBB and anterolateral infarct but no change from prior EKG.  He has some apical ischemia on prior nuclear stress test in 2014 and was managed medically.  Initial trop today 0.4.  Will continue to cycle troponin until it peaks.  Will transfer to [facility] stepdown.  Will need cath but will have to do on Wed as he took his Eliquis this am.  Will hold Eliquis and start IV Heparin gtt per pharmacy.  Continue high dose statin  and BB.  Cannot increase dose of BB due to resting bradycardia.  Start ASA 84m daily and long acting nitrate.  Repeat 2D echo to reassess LVF.  2.  ASCAD s/p remote CABG - likely he has closed down one of his grafts causing the CP and trop bump.  He has not been on ASA due to NOAC but will add for now.  Continue statin and nitrates.  3.  Hyperlipidemia with LDL goal < 70.  Continue high dose statin and fenofibrate.  Check FLP in am  4.  Persistent atrial fibrillation maintaining sinus bradycardia. Continue Amio and BB.  Hold eliquis for cath.    5.  CKD stage 3.  His creatinine is 1.79 today.  Will gently hydrate and repeat BMET in am.  6.  DM type 2 on Insulin.  Check HbA1C.  BS check qac and hs with SS Insulin coverage.    7.  HTN - BP controlled on current meds.  Continue BB.  8.  AAA -  He had an [place] recently at the [place] as well as carotid dopplers.  I will get results of these.  Fransico Him, MD  10/08/2016  6:26 PM

## 2016-10-08 NOTE — Progress Notes (Signed)
ANTICOAGULATION CONSULT NOTE - Initial Consult  Pharmacy Consult for IV heparin Indication: chest pain/ACS  Allergies  Allergen Reactions  . Tetanus Toxoids Hives    Patient Measurements: Height: 6' (182.9 cm) Weight: 280 lb (127 kg) IBW/kg (Calculated) : 77.6 Heparin Dosing Weight: 106 kg  Vital Signs: Temp: 97.9 F (36.6 C) (04/30 1454) Temp Source: Oral (04/30 1454) BP: 108/73 (04/30 1454) Pulse Rate: 56 (04/30 1454)  Labs:  Recent Labs  10/08/16 1454  HGB 13.9  HCT 41.9  PLT 195    CrCl cannot be calculated (Patient's most recent lab result is older than the maximum 21 days allowed.).   Medical History: Past Medical History:  Diagnosis Date  . AAA (abdominal aortic aneurysm) ([place])    a. Aortic US 05/2012: 3.9 cm fusiform abdominal aortic aneurysm. F/u recommended 05/2014.  Marland Kitchen Anemia   . Atrial fibrillation ([place])    a. Post-op CABG 05/2009. b. Re-identified 12/2013.  Marland Kitchen CAD (coronary artery disease)    a. s/p CABGx4 05/2009.  Marland Kitchen Chronic constipation   . CKD (chronic kidney disease), stage III   . Cocaine abuse in remission   . Colon polyps   . Diabetes mellitus   . Empyema lung ([place])    a. PNA 02/2011 c/b empyema requiring chest tube, drainage of pleural effusion and decortication.  . Enlarged prostate   . Hepatic steatosis   . Hyperlipidemia   . Hypertension   . Hypothyroidism   . Iron deficiency anemia   . LAFB (left anterior fascicular block)   . Lung cancer ([place])    a. Tx with interleukin in 2002 per patient.  . Nodule of left lung    a. Cavitary nodules 02/2011, f/u recommended.  . Obesity   . Paroxysmal supraventricular tachycardia ([place])    a. While inpatient 02/2011 sick with empyema - wide appearace due to RBBB.  . RBBB   . Renal cell carcinoma    a. s/p R nephrectomy 2002. Reported pulmonary mets per note at that time.  . Scrotal abscess    a. 2012 2/2 Streptococcus group C, status post incision and drainage.  Marland Kitchen Ulcerative proctitis ([place])     a. Remote dx dating back to 1979.  . Vitamin D deficiency      Assessment: 30 y/oM with PMH of a-fib on apixaban who presented to [facility] ED on 10/08/2016 with chest pain. To start heparin infusion for NSTEM. Last dose of Apixaban 67m documented as 10/08/2016 at 1100. Per discussion with Dr. KWilson Singer appropriate to start heparin infusion (no initial bolus) 12 hours after last dose of Apixaban.   Today, 10/08/16: CBC: WNL Baseline aPTT: 45 seconds Baseline PT/INR: 16.1/1.28 Baseline heparin level:  > 2.2 units/mL-Note, Apixaban can falsely elevate heparin level  Goal of Therapy:  Heparin level 0.3-0.7 units/ml aPTT 66-102 seconds Monitor platelets by anticoagulation protocol: Yes   Plan:  Start heparin infusion (no initial bolus) at 1350 units/hr at 2300. Check aPTT 8 hours after heparin infusion initiated.  Check daily aPTT, heparin level, and CBC. Plan to adjust heparin using aPTT until effects of Apixaban on heparin level have diminished. Monitor closely for s/sx of bleeding.   JLindell Spar PharmD, BCPS Pager: [phone]/30/2018 5:24 PM

## 2016-10-08 NOTE — ED Notes (Signed)
Patient troponin 0.40. RN Loma Sousa and EDP Kohut notified of critical result

## 2016-10-08 NOTE — ED Triage Notes (Signed)
Patient c/o chest pain that has been going on for past couple days. Patient reports that when he takes his Nitro it will go away.  Patient unsure if he took a Nitro this morning or not.

## 2016-10-08 NOTE — ED Notes (Signed)
Spoke with pharmacy. Pt Heparin to start as scheduled at 2300 d/t pt already having his Eliquis dose today.

## 2016-10-09 DIAGNOSIS — I25110 Atherosclerotic heart disease of native coronary artery with unstable angina pectoris: Secondary | ICD-10-CM

## 2016-10-09 LAB — BASIC METABOLIC PANEL
ANION GAP: 9 (ref 5–15)
BUN: 17 mg/dL (ref 6–20)
CO2: 25 mmol/L (ref 22–32)
Calcium: 9.4 mg/dL (ref 8.9–10.3)
Chloride: 102 mmol/L (ref 101–111)
Creatinine, Ser: 1.56 mg/dL — ABNORMAL HIGH (ref 0.61–1.24)
GFR calc Af Amer: 49 mL/min — ABNORMAL LOW (ref >60)
GFR, EST NON AFRICAN AMERICAN: 43 mL/min — AB (ref >60)
Glucose, Bld: 173 mg/dL — ABNORMAL HIGH (ref 65–99)
POTASSIUM: 4.3 mmol/L (ref 3.5–5.1)
SODIUM: 136 mmol/L (ref 135–145)

## 2016-10-09 LAB — LIPID PANEL
CHOL/HDL RATIO: 6.9 ratio
CHOLESTEROL: 194 mg/dL (ref 0–200)
HDL: 28 mg/dL — ABNORMAL LOW (ref >40)
LDL Cholesterol: 113 mg/dL — ABNORMAL HIGH (ref 0–99)
TRIGLYCERIDES: 265 mg/dL — AB (ref <150)
VLDL: 53 mg/dL — AB (ref 0–40)

## 2016-10-09 LAB — PROTIME-INR
INR: 1.17
PROTHROMBIN TIME: 15 s (ref 11.4–15.2)

## 2016-10-09 LAB — CBC
HCT: 39.2 % (ref 39.0–52.0)
Hemoglobin: 13.1 g/dL (ref 13.0–17.0)
MCH: 32 pg (ref 26.0–34.0)
MCHC: 33.4 g/dL (ref 30.0–36.0)
MCV: 95.6 fL (ref 78.0–100.0)
PLATELETS: 179 10*3/uL (ref 150–400)
RBC: 4.1 MIL/uL — AB (ref 4.22–5.81)
RDW: 13 % (ref 11.5–15.5)
WBC: 6.3 10*3/uL (ref 4.0–10.5)

## 2016-10-09 LAB — T4, FREE: Free T4: 1.24 ng/dL — ABNORMAL HIGH (ref 0.61–1.12)

## 2016-10-09 LAB — GLUCOSE, CAPILLARY
GLUCOSE-CAPILLARY: 156 mg/dL — AB (ref 65–99)
GLUCOSE-CAPILLARY: 188 mg/dL — AB (ref 65–99)
Glucose-Capillary: 157 mg/dL — ABNORMAL HIGH (ref 65–99)
Glucose-Capillary: 158 mg/dL — ABNORMAL HIGH (ref 65–99)
Glucose-Capillary: 168 mg/dL — ABNORMAL HIGH (ref 65–99)
Glucose-Capillary: 168 mg/dL — ABNORMAL HIGH (ref 65–99)
Glucose-Capillary: 239 mg/dL — ABNORMAL HIGH (ref 65–99)

## 2016-10-09 LAB — HEPATIC FUNCTION PANEL
ALBUMIN: 3.5 g/dL (ref 3.5–5.0)
ALT: 21 U/L (ref 17–63)
AST: 27 U/L (ref 15–41)
Alkaline Phosphatase: 27 U/L — ABNORMAL LOW (ref 38–126)
Bilirubin, Direct: 0.2 mg/dL (ref 0.1–0.5)
Indirect Bilirubin: 0.5 mg/dL (ref 0.3–0.9)
TOTAL PROTEIN: 7 g/dL (ref 6.5–8.1)
Total Bilirubin: 0.7 mg/dL (ref 0.3–1.2)

## 2016-10-09 LAB — HEPARIN LEVEL (UNFRACTIONATED): HEPARIN UNFRACTIONATED: 1.94 [IU]/mL — AB (ref 0.30–0.70)

## 2016-10-09 LAB — TROPONIN I
Troponin I: 0.55 ng/mL (ref <0.03)
Troponin I: 0.51 ng/mL (ref <0.03)

## 2016-10-09 LAB — APTT
APTT: 116 s — AB (ref 24–36)
aPTT: 104 seconds — ABNORMAL HIGH (ref 24–36)

## 2016-10-09 MED ORDER — MORPHINE SULFATE (PF) 4 MG/ML IV SOLN
INTRAVENOUS | Status: AC
  Filled 2016-10-09: qty 1

## 2016-10-09 MED ORDER — OXYCODONE HCL 5 MG PO TABS
5.0000 mg | ORAL_TABLET | ORAL | Status: DC | PRN
  Administered 2016-10-09 – 2016-10-11 (×6): 5 mg via ORAL
  Filled 2016-10-09 (×6): qty 1

## 2016-10-09 MED ORDER — NITROGLYCERIN IN D5W 200-5 MCG/ML-% IV SOLN
0.0000 ug/min | INTRAVENOUS | Status: DC
  Administered 2016-10-09: 5 ug/min via INTRAVENOUS
  Filled 2016-10-09: qty 250

## 2016-10-09 MED ORDER — SALINE SPRAY 0.65 % NA SOLN
1.0000 | NASAL | Status: DC | PRN
  Administered 2016-10-09: 1 via NASAL
  Filled 2016-10-09: qty 44

## 2016-10-09 MED ORDER — OXYCODONE-ACETAMINOPHEN 5-325 MG PO TABS
1.0000 | ORAL_TABLET | ORAL | Status: DC | PRN
  Administered 2016-10-09 – 2016-10-11 (×3): 1 via ORAL
  Filled 2016-10-09 (×3): qty 1

## 2016-10-09 MED ORDER — MORPHINE SULFATE (PF) 4 MG/ML IV SOLN
2.0000 mg | INTRAVENOUS | Status: DC | PRN
  Administered 2016-10-09 – 2016-10-10 (×2): 2 mg via INTRAVENOUS
  Filled 2016-10-09: qty 1

## 2016-10-09 NOTE — Progress Notes (Signed)
[place] for heparin Indication: chest pain/ACS  Allergies  Allergen Reactions  . Tetanus Toxoids Hives    Patient Measurements: Height: 6' (182.9 cm) Weight: 268 lb 8.3 oz (121.8 kg) IBW/kg (Calculated) : 77.6 Heparin Dosing Weight: 106 kg  Vital Signs: Temp: 98.4 F (36.9 C) (04/30 2337) Temp Source: Oral (05/01 0400) BP: 102/71 (05/01 0600) Pulse Rate: 57 (05/01 0600)  Labs:  Recent Labs  10/08/16 1454 10/08/16 2001 10/09/16 0506  HGB 13.9  --  13.1  HCT 41.9  --  39.2  PLT 195  --  179  APTT 45*  --  116*  LABPROT 16.1* 15.5* 15.0  INR 1.28 1.22 1.17  HEPARINUNFRC >2.20*  --  1.94*  CREATININE 1.79*  --  1.56*  TROPONINI  --  0.50*  --     Estimated Creatinine Clearance: 57.7 mL/min (A) (by C-G formula based on SCr of 1.56 mg/dL (H)).  Assessment: 71 y.o. male admitted with chest pain, h/o Afib and Eliquis on hold, for heparin   Goal of Therapy:  Heparin level 0.3-0.7 units/ml aPTT 66-102 seconds Monitor platelets by anticoagulation protocol: Yes   Plan:  Decrease Heparin 1250 units/hr Follow up after cath today   Phillis Knack, PharmD, BCPS

## 2016-10-09 NOTE — Progress Notes (Signed)
No further c/o of chest pain. NTG on 10 mcg. Min. Denies headache. C/o some back pain, medicated with roxicet. Family at bedside, information provided to questions.

## 2016-10-09 NOTE — Care Management Note (Signed)
Case Management Note  Patient Details  Name: Matthew [name] MRN: [id number] Date of Birth: Jul 09, 1945  Subjective/Objective:   Pt admitted with NSTEMI  - planned cath 10/10/16               Action/Plan:  PTA independent from home    Expected Discharge Date:                  Expected Discharge Plan:  Home/Self Care  In-House Referral:     Discharge planning Services  CM Consult  Post Acute Care Choice:    Choice offered to:     DME Arranged:    DME Agency:     HH Arranged:    HH Agency:     Status of Service:     If discussed at [employer], dates discussed:    Additional Comments:  Maryclare Labrador, RN 10/09/2016, 9:17 AM

## 2016-10-09 NOTE — Progress Notes (Signed)
ANTICOAGULATION CONSULT NOTE - Follow Up Consult  Pharmacy Consult for Heparin (Eliquis on hold) Indication: chest pain/ACS/NSTEMI, hx afib  Allergies  Allergen Reactions  . Tetanus Toxoids Hives    Patient Measurements: Height: 6' (182.9 cm) Weight: 268 lb 8.3 oz (121.8 kg) IBW/kg (Calculated) : 77.6 Heparin Dosing Weight: 106 kg  Vital Signs: Temp: 98 F (36.7 C) (05/01 1956) Temp Source: Oral (05/01 1956) BP: 95/45 (05/01 2000) Pulse Rate: 55 (05/01 2000)  Labs:  Recent Labs  10/08/16 1454 10/08/16 2001 10/09/16 0506 10/09/16 1152 10/09/16 1805  HGB 13.9  --  13.1  --   --   HCT 41.9  --  39.2  --   --   PLT 195  --  179  --   --   APTT 45*  --  116*  --  104*  LABPROT 16.1* 15.5* 15.0  --   --   INR 1.28 1.22 1.17  --   --   HEPARINUNFRC >2.20*  --  1.94*  --   --   CREATININE 1.79*  --  1.56*  --   --   TROPONINI  --  0.50*  --  0.55* 0.51*    Estimated Creatinine Clearance: 57.7 mL/min (A) (by C-G formula based on SCr of 1.56 mg/dL (H)).  Assessment:   71 yr old male with chest pain/NSTEMI.   Hx Afib, Eliquis on hold. Last Eliquis dose 11am on 10/08/16. For cardiac cath on 10/10/16. Continues on IV heparin.    APTT is just above goal (104 seconds) on heparin at 1250 units/hr.   Using aPTTs for heparin monitoring while heparin levels are skewed by recent Eliquis doses.  Goal of Therapy:  Heparin level 0.3-0.7 units/ml aPTT 66-102 seconds Monitor platelets by anticoagulation protocol: Yes   Plan:   Decrease heparin drip to 1150 units/hr.  Next labs in am, aPTT, heparin level and CBC.  Follow up post-cath 5/2  Apixaban on hold.  Arty Baumgartner, [place]ta Pager: [phone] 10/09/2016,8:09 PM

## 2016-10-09 NOTE — Progress Notes (Deleted)
Discharge instructions discussed with patient. All medications and when they are due discussed with patient at length.  Will give patient all PM medications prior to discharge, as patient is unsure he will make it to pharmacy before they close this evening.  Discussed need for patient to have a scale, heart failure notified and need for script for NTG SL.  Discussed Life vest with patient and went over all parts of life vest, battery, when to change it, when he could have the vest off.  Appointments were discussed as well.  Emphasized the importance of keeping all appointments and blood draws for his coumadin.  Education material discussed and sent with patient for review.  Patient voiced understanding and was relieved that [facility] RN would be out in the AM for visit.  Discussed with patient numbers to call for questions or concerns.

## 2016-10-09 NOTE — Progress Notes (Signed)
C/o excruciating chest pain, 7=8 /10,  Increasing NTG, called cardiology EKG 12 lead performed.

## 2016-10-09 NOTE — Plan of Care (Signed)
Problem: Tissue Perfusion: Goal: Risk factors for ineffective tissue perfusion will decrease Outcome: Progressing Nitro gtt started, patient pain has decreased  Problem: Activity: Goal: Risk for activity intolerance will decrease Outcome: Progressing OOB to chair

## 2016-10-09 NOTE — Progress Notes (Addendum)
SUBJECTIVE: No chest pain this am  Tele: sinus brady  BP 108/65 (BP Location: Left Arm)   Pulse (!) 54   Temp 98.1 F (36.7 C) (Oral)   Resp 14   Ht 6' (1.829 m)   Wt 268 lb 8.3 oz (121.8 kg)   SpO2 95%   BMI 36.42 kg/m   Intake/Output Summary (Last 24 hours) at 10/09/16 2536 Last data filed at 10/09/16 0809  Gross per 24 hour  Intake           348.38 ml  Output             1875 ml  Net         -1526.62 ml    PHYSICAL EXAM General: Well developed, well nourished, in no acute distress. Alert and oriented x 3.  Psych:  Good affect, responds appropriately Neck: No JVD. No masses noted.  Lungs: Clear bilaterally with no wheezes or rhonci noted.  Heart: RRR with no murmurs noted. Abdomen: Bowel sounds are present. Soft, non-tender.  Extremities: No lower extremity edema.   LABS: Basic Metabolic Panel:  Recent Labs  10/08/16 1454 10/08/16 2001 10/09/16 0506  NA 136  --  136  K 4.2  --  4.3  CL 101  --  102  CO2 26  --  25  GLUCOSE 170*  --  173*  BUN 16  --  17  CREATININE 1.79*  --  1.56*  CALCIUM 9.0  --  9.4  MG  --  1.8  --    CBC:  Recent Labs  10/08/16 1454 10/09/16 0506  WBC 6.2 6.3  HGB 13.9 13.1  HCT 41.9 39.2  MCV 98.1 95.6  PLT 195 179   Cardiac Enzymes:  Recent Labs  10/08/16 2001  TROPONINI 0.50*   Fasting Lipid Panel:  Recent Labs  10/09/16 0506  CHOL 194  HDL 28*  LDLCALC 113*  TRIG 265*  CHOLHDL 6.9    Current Meds: . amiodarone  200 mg Oral Daily  . aspirin EC  81 mg Oral Daily  . atorvastatin  80 mg Oral q1800  . buPROPion  150 mg Oral q morning - 10a  . cholecalciferol  4,000 Units Oral TID  . fenofibrate  54 mg Oral Daily  . ferrous sulfate  325 mg Oral BID WC  . gabapentin  300 mg Oral q12n4p  . hydrOXYzine  50 mg Oral QHS  . insulin aspart  0-9 Units Subcutaneous Q4H  . insulin aspart  10 Units Subcutaneous TID WC  . insulin detemir  30 Units Subcutaneous BID  . isosorbide mononitrate  30 mg Oral  Daily  . levothyroxine  100 mcg Oral QAC breakfast  . magnesium oxide  400 mg Oral Daily  . metoprolol tartrate  12.5 mg Oral BID  . omega-3 acid ethyl esters  1 g Oral Daily  . pantoprazole  40 mg Oral BID  . sodium chloride flush  3 mL Intravenous Q12H     ASSESSMENT AND PLAN: 71 yo male with history of CAD s/p CABG, AAA, persistent AF, CKD stage 3, DM admitted with NSTEMI.   1. CAD s/p CABG/NSTEMI: Pain free this am. Troponin 0.5. He is on IV heparin while awaiting cath on Wednesday (Eliquis held starting 10/08/16 with pm dose). Continue ASA, high dose statin and beta blocker. Echo today to assess LVEF. He can eat today. NPO at midnight. I have personally reviewed his EKG today and it shows sinus brady  with RBBB, LAFB and old inferior Q waves.   2. HLD: Continue statin  3. Paroxysmal atrial fibrillation: He is in sinus this am. Continue amiodarone and beta blocker. Eliquis on hold for cath. He is now on IV heparin.   4. Chronic kidney disease, stage 3: renal function stable.   5. HTN: BP controlled.    Lauree Chandler  5/1/20188:12 AM

## 2016-10-10 ENCOUNTER — Other Ambulatory Visit (HOSPITAL_COMMUNITY): Payer: Self-pay

## 2016-10-10 ENCOUNTER — Encounter (HOSPITAL_COMMUNITY): Payer: Self-pay | Admitting: Cardiovascular Disease

## 2016-10-10 ENCOUNTER — Encounter (HOSPITAL_COMMUNITY)
Admission: EM | Disposition: A | Discharge status: Home Health | Payer: Self-pay | Source: Home / Self Care | Attending: Cardiology

## 2016-10-10 DIAGNOSIS — I200 Unstable angina: Secondary | ICD-10-CM

## 2016-10-10 HISTORY — PX: LEFT HEART CATH AND CORS/GRAFTS ANGIOGRAPHY: CATH118250

## 2016-10-10 HISTORY — PX: CORONARY STENT INTERVENTION: CATH118234

## 2016-10-10 LAB — BASIC METABOLIC PANEL
Anion gap: 8 (ref 5–15)
BUN: 16 mg/dL (ref 6–20)
CHLORIDE: 107 mmol/L (ref 101–111)
CO2: 23 mmol/L (ref 22–32)
CREATININE: 1.68 mg/dL — AB (ref 0.61–1.24)
Calcium: 9.1 mg/dL (ref 8.9–10.3)
GFR calc Af Amer: 45 mL/min — ABNORMAL LOW (ref >60)
GFR calc non Af Amer: 39 mL/min — ABNORMAL LOW (ref >60)
Glucose, Bld: 175 mg/dL — ABNORMAL HIGH (ref 65–99)
Potassium: 4.4 mmol/L (ref 3.5–5.1)
Sodium: 138 mmol/L (ref 135–145)

## 2016-10-10 LAB — POCT ACTIVATED CLOTTING TIME: Activated Clotting Time: 395 seconds

## 2016-10-10 LAB — CBC
HEMATOCRIT: 38.7 % — AB (ref 39.0–52.0)
Hemoglobin: 13 g/dL (ref 13.0–17.0)
MCH: 32.2 pg (ref 26.0–34.0)
MCHC: 33.6 g/dL (ref 30.0–36.0)
MCV: 95.8 fL (ref 78.0–100.0)
Platelets: 203 10*3/uL (ref 150–400)
RBC: 4.04 MIL/uL — ABNORMAL LOW (ref 4.22–5.81)
RDW: 13.1 % (ref 11.5–15.5)
WBC: 6.8 10*3/uL (ref 4.0–10.5)

## 2016-10-10 LAB — GLUCOSE, CAPILLARY
GLUCOSE-CAPILLARY: 95 mg/dL (ref 65–99)
GLUCOSE-CAPILLARY: 141 mg/dL — AB (ref 65–99)
GLUCOSE-CAPILLARY: 125 mg/dL — AB (ref 65–99)
Glucose-Capillary: 149 mg/dL — ABNORMAL HIGH (ref 65–99)
Glucose-Capillary: 105 mg/dL — ABNORMAL HIGH (ref 65–99)

## 2016-10-10 LAB — HEMOGLOBIN A1C
Hgb A1c MFr Bld: 7 % — ABNORMAL HIGH (ref 4.8–5.6)
Mean Plasma Glucose: 154 mg/dL

## 2016-10-10 LAB — HEPARIN LEVEL (UNFRACTIONATED): Heparin Unfractionated: 0.76 IU/mL — ABNORMAL HIGH (ref 0.30–0.70)

## 2016-10-10 LAB — APTT: aPTT: 93 seconds — ABNORMAL HIGH (ref 24–36)

## 2016-10-10 LAB — TROPONIN I: Troponin I: 0.55 ng/mL (ref <0.03)

## 2016-10-10 SURGERY — LEFT HEART CATH AND CORS/GRAFTS ANGIOGRAPHY
Anesthesia: LOCAL

## 2016-10-10 MED ORDER — SODIUM CHLORIDE 0.9 % IV SOLN: 250.0000 mL | INTRAVENOUS | Status: DC | PRN

## 2016-10-10 MED ORDER — LIDOCAINE HCL (PF) 1 % IJ SOLN
INTRAMUSCULAR | Status: AC
  Filled 2016-10-10: qty 30

## 2016-10-10 MED ORDER — BIVALIRUDIN TRIFLUOROACETATE 250 MG IV SOLR
INTRAVENOUS | Status: DC | PRN
  Administered 2016-10-10 (×2): 1.75 mg/kg/h via INTRAVENOUS

## 2016-10-10 MED ORDER — FENTANYL CITRATE (PF) 100 MCG/2ML IJ SOLN
INTRAMUSCULAR | Status: DC | PRN
  Administered 2016-10-10 (×4): 25 ug via INTRAVENOUS

## 2016-10-10 MED ORDER — CLOPIDOGREL BISULFATE 300 MG PO TABS
ORAL_TABLET | ORAL | Status: DC | PRN
  Administered 2016-10-10: 600 mg via ORAL

## 2016-10-10 MED ORDER — HEPARIN (PORCINE) IN NACL 2-0.9 UNIT/ML-% IJ SOLN
INTRAMUSCULAR | Status: AC
  Filled 2016-10-10: qty 1000

## 2016-10-10 MED ORDER — SODIUM CHLORIDE 0.9% FLUSH: 3.0000 mL | Freq: Two times a day (BID) | INTRAVENOUS | Status: DC

## 2016-10-10 MED ORDER — ONDANSETRON HCL 4 MG/2ML IJ SOLN
4.0000 mg | Freq: Four times a day (QID) | INTRAMUSCULAR | Status: DC | PRN
Start: 2016-10-10 — End: 2016-10-11
  Administered 2016-10-10: 13:00:00 4 mg via INTRAVENOUS
  Filled 2016-10-10: qty 2

## 2016-10-10 MED ORDER — NITROGLYCERIN 1 MG/10 ML FOR IR/CATH LAB
INTRA_ARTERIAL | Status: DC | PRN
  Administered 2016-10-10: 200 ug

## 2016-10-10 MED ORDER — MIDAZOLAM HCL 2 MG/2ML IJ SOLN
INTRAMUSCULAR | Status: DC | PRN
  Administered 2016-10-10 (×4): 1 mg via INTRAVENOUS

## 2016-10-10 MED ORDER — HEART ATTACK BOUNCING BOOK
Freq: Once | Status: AC
  Administered 2016-10-10: 20:00:00
  Filled 2016-10-10: qty 1

## 2016-10-10 MED ORDER — FENTANYL CITRATE (PF) 100 MCG/2ML IJ SOLN
INTRAMUSCULAR | Status: AC
  Filled 2016-10-10: qty 2

## 2016-10-10 MED ORDER — HYDRALAZINE HCL 20 MG/ML IJ SOLN: 5.0000 mg | INTRAMUSCULAR | Status: AC | PRN

## 2016-10-10 MED ORDER — SODIUM CHLORIDE 0.9 % IV SOLN
INTRAVENOUS | Status: DC
  Administered 2016-10-10: 19:00:00 via INTRAVENOUS

## 2016-10-10 MED ORDER — ACTIVE PARTNERSHIP FOR HEALTH OF YOUR HEART BOOK
Freq: Once | Status: AC
  Administered 2016-10-10: 20:00:00
  Filled 2016-10-10: qty 1

## 2016-10-10 MED ORDER — BIVALIRUDIN TRIFLUOROACETATE 250 MG IV SOLR
INTRAVENOUS | Status: AC
  Filled 2016-10-10: qty 250

## 2016-10-10 MED ORDER — LIDOCAINE HCL (PF) 1 % IJ SOLN
INTRAMUSCULAR | Status: DC | PRN
  Administered 2016-10-10: 20 mL

## 2016-10-10 MED ORDER — INSULIN ASPART 100 UNIT/ML ~~LOC~~ SOLN
0.0000 [IU] | Freq: Three times a day (TID) | SUBCUTANEOUS | Status: DC
  Administered 2016-10-11 (×2): 1 [IU] via SUBCUTANEOUS

## 2016-10-10 MED ORDER — ASPIRIN 81 MG PO CHEW
81.0000 mg | CHEWABLE_TABLET | Freq: Every day | ORAL | Status: DC
  Administered 2016-10-11: 13:00:00 81 mg via ORAL
  Filled 2016-10-10 (×2): qty 1

## 2016-10-10 MED ORDER — SODIUM CHLORIDE 0.9% FLUSH: 3.0000 mL | INTRAVENOUS | Status: DC | PRN

## 2016-10-10 MED ORDER — LABETALOL HCL 5 MG/ML IV SOLN: 10.0000 mg | INTRAVENOUS | Status: AC | PRN

## 2016-10-10 MED ORDER — HEPARIN (PORCINE) IN NACL 2-0.9 UNIT/ML-% IJ SOLN
INTRAMUSCULAR | Status: DC | PRN
  Administered 2016-10-10: 1000 mL

## 2016-10-10 MED ORDER — MIDAZOLAM HCL 2 MG/2ML IJ SOLN
INTRAMUSCULAR | Status: AC
  Filled 2016-10-10: qty 2

## 2016-10-10 MED ORDER — ANGIOPLASTY BOOK
Freq: Once | Status: AC
  Administered 2016-10-10: 20:00:00
  Filled 2016-10-10: qty 1

## 2016-10-10 MED ORDER — IOPAMIDOL (ISOVUE-370) INJECTION 76%
INTRAVENOUS | Status: AC
  Filled 2016-10-10: qty 100

## 2016-10-10 MED ORDER — INSULIN ASPART 100 UNIT/ML ~~LOC~~ SOLN: 0.0000 [IU] | Freq: Every day | SUBCUTANEOUS | Status: DC

## 2016-10-10 MED ORDER — ACETAMINOPHEN 325 MG PO TABS
650.0000 mg | ORAL_TABLET | ORAL | Status: DC | PRN
Start: 2016-10-10 — End: 2016-10-11

## 2016-10-10 MED ORDER — SODIUM CHLORIDE 0.9 % IV SOLN
INTRAVENOUS | Status: DC
  Administered 2016-10-10: 12:00:00 via INTRAVENOUS

## 2016-10-10 MED ORDER — NITROGLYCERIN 1 MG/10 ML FOR IR/CATH LAB
INTRA_ARTERIAL | Status: AC
  Filled 2016-10-10: qty 10

## 2016-10-10 MED ORDER — DIAZEPAM 5 MG PO TABS: 5.0000 mg | ORAL_TABLET | ORAL | Status: DC | PRN

## 2016-10-10 MED ORDER — BIVALIRUDIN BOLUS VIA INFUSION - CUPID
INTRAVENOUS | Status: DC | PRN
  Administered 2016-10-10: 91.5 mg via INTRAVENOUS

## 2016-10-10 MED ORDER — IOPAMIDOL (ISOVUE-370) INJECTION 76%
INTRAVENOUS | Status: AC
  Filled 2016-10-10: qty 125

## 2016-10-10 MED ORDER — CLOPIDOGREL BISULFATE 300 MG PO TABS
ORAL_TABLET | ORAL | Status: AC
  Filled 2016-10-10: qty 2

## 2016-10-10 MED ORDER — CLOPIDOGREL BISULFATE 75 MG PO TABS
75.0000 mg | ORAL_TABLET | Freq: Every day | ORAL | Status: DC
  Administered 2016-10-11: 75 mg via ORAL
  Filled 2016-10-10: qty 1

## 2016-10-10 SURGICAL SUPPLY — 20 items
BALLN MOZEC 2.0X12 (BALLOONS) ×2
BALLN ~~LOC~~ EUPHORA RX 3.25X15 (BALLOONS) ×2
BALLOON MOZEC 2.0X12 (BALLOONS) IMPLANT
BALLOON ~~LOC~~ EUPHORA RX 3.25X15 (BALLOONS) IMPLANT
CATH INFINITI 5 FR IM (CATHETERS) ×1 IMPLANT
CATH INFINITI 5 FR RCB (CATHETERS) ×1 IMPLANT
CATH INFINITI 5FR MULTPACK ANG (CATHETERS) ×1 IMPLANT
CATH VISTA GUIDE 6FR LCB (CATHETERS) ×1 IMPLANT
FILTERWIRE EZ 2.25-3.5 190CM (WIRE) ×2 IMPLANT
GUIDEWIRE 3MM J TIP .035 145 (WIRE) ×1 IMPLANT
KIT ENCORE 26 ADVANTAGE (KITS) ×1 IMPLANT
KIT HEART LEFT (KITS) ×2 IMPLANT
PACK CARDIAC CATHETERIZATION (CUSTOM PROCEDURE TRAY) ×2 IMPLANT
SHEATH PINNACLE 5F 10CM (SHEATH) ×1 IMPLANT
SHEATH PINNACLE 6F 10CM (SHEATH) ×1 IMPLANT
STENT SYNERGY DES 3X28 (Permanent Stent) ×1 IMPLANT
TRANSDUCER W/STOPCOCK (MISCELLANEOUS) ×2 IMPLANT
TUBING CIL FLEX 10 FLL-RA (TUBING) ×2 IMPLANT
WIRE ASAHI PROWATER 180CM (WIRE) ×1 IMPLANT
WIRE EMERALD 3MM-J .035X260CM (WIRE) ×1 IMPLANT

## 2016-10-10 NOTE — Interval H&P Note (Signed)
Cath Lab Visit (complete for each Cath Lab visit)  Clinical Evaluation Leading to the Procedure:   ACS: No.  Non-ACS:    Anginal Classification: CCS III  Anti-ischemic medical therapy: Maximal Therapy (2 or more classes of medications)  Non-Invasive Test Results: No non-invasive testing performed  Prior CABG: Previous CABG      History and Physical Interval Note:  10/10/2016 8:25 AM  Matthew [name]  has presented today for surgery, with the diagnosis of cp, cad  The various methods of treatment have been discussed with the patient and family. After consideration of risks, benefits and other options for treatment, the patient has consented to  Procedure(s): Left Heart Cath and Cors/Grafts Angiography (N/A) as a surgical intervention .  The patient's history has been reviewed, patient examined, no change in status, stable for surgery.  I have reviewed the patient's chart and labs.  Questions were answered to the patient's satisfaction.     Shelva Majestic

## 2016-10-10 NOTE — Discharge Summary (Signed)
Discharge Summary    Patient ID: Matthew [name],  MRN: [id number], DOB/AGE: [birth year] 71 y.o.  Admit date: 10/08/2016 Discharge date: 10/11/2016  Primary Care Provider: PROVIDER NOT IN SYSTEM Primary Cardiologist: [facility]  Discharge Diagnoses    Principal Problem:   NSTEMI (non-ST elevated myocardial infarction) [facility]) Active Problems:   Hypertension   CAD (coronary artery disease)   PAF (paroxysmal atrial fibrillation) (HCC)   CKD (chronic kidney disease), stage III   RBBB   LAFB (left anterior fascicular block)   Unstable angina (HCC)   Allergies Allergies  Allergen Reactions  . Tetanus Toxoids Hives    Diagnostic Studies/Procedures    LHC: 10/10/16  Conclusion     Prox LAD to Mid LAD lesion, 100 %stenosed.  Prox RCA to Mid RCA lesion, 100 %stenosed.  Mid Cx lesion, 95 %stenosed.  Ost 2nd Mrg to 2nd Mrg lesion, 100 %stenosed.  Dist Cx lesion, 100 %stenosed.  LIMA and is normal in caliber and anatomically normal.  SVG and is normal in caliber and anatomically normal.  Seq SVG- OM2-OM3 and is normal in caliber.  Dist Graft-2 lesion, 50 %stenosed.  SVG.  Origin lesion, 100 %stenosed.  A STENT SYNERGY DES [id number] drug eluting stent was successfully placed.  Dist Graft-1 lesion, 95 %stenosed.  Post intervention, there is a 0% residual stenosis.  Prox Graft to Mid Graft lesion, 25 %stenosed.   Severe native CAD with total occlusion of the proximal LAD at a site of prior proximal stenting after a small diagonal vessel; subtotal occlusion of the circumflex vessel prior to the OM2, with occlusion of the ostium of the OM1, and diffuse 95% mid AV groove stenosis with total distal occlusion before the distal marginal branch; and total proximal RCA occlusion.  Patent LIMA graft which supplies the mid LAD and 2 diagonal vessels.  The sequential vein graft which supplies a large OM 2 - OM3  has mild diffuse 20-30% stenosis proximally in the graft and  in the mid body of the graft there is a sequential stenosis of 95%, followed by eccentric 50% narrowing with TIMI-3 flow down both marginal branches.  Ostially occluded vein graft which has supplied a diagonal vessel.  Patent SVG to the distal RCA.  Successful PCI to the sequential SVG supplying the marginal vessels with ultimate insertion of a 3.028 mm Synergy DES stent to cover both lesions, postdilated to 3.18 mm with the 95 and 50% stenoses being reduced to 0%.  There is brisk TIMI-3 flow down to both marginal branches.  RECOMMENDATION: The patient has a history of atrial fibrillation but is in sinus rhythm today.  Eliquis will most likely be reinstituted and the patient will be on short-term triple drug therapy with ultimate plans for Plavix/eliquis.   Diagnostic Diagram       Post-Intervention Diagram        _____________   History of Present Illness     Matthew [name] is a 71 y.o. male who was seen on 4/30 for the evaluation of chest pain and elevated troponin at the request of Dr. Wilson Singer.  The patient has a history of ASCAD s/p CABG x 4 in 2010, AAA, persistent atrial fibrillation on Eliquis for CHADS2VASC score of 4, CKD stage III, DM who presented to [facility] ER with complaints of chest pain. He said that over the past few days he had episodic CP both at rest and with exertion that he described as a severe tightness that would resolve after a  SL NTG.  He denied any diaphoresis or SOB with the CP but had some nausea and belching with the discomfort.  He had an episode of chest discomfort the day of admission after walking to the mailbox and didn't even make it there due to severe chest discomfort and he had his son bring him to the ER.  He denied any exertional SOB or DOE, palpitations, dizziness, LE edema or syncope. Initial trop was elevated at 0.4 with creatinine 1.79, o/w labs ok.  He had some SOB back in 2014 and had a nuclear stress test that showed some mild apical ischemia  and was medically managed.  2D echo in 2015 showed EF 40-45% with apical HK.  Cardiology was asked to admit for NSTEMI.  Hospital Course   EKG on admission showed SB with RBBB and old inferior Q waves. His Eliquis was held with plans to undergo cath on 10/10/16 with Dr. Claiborne Billings noted above with DES x1 placed to SVG-->OM2/OM3. Troponin peaked at 0.55. Placed on DAPT with ASA/Plavix. Eliquis was resumed post cath with plans for triple therapy for one month, the dc ASA and continue Eliquis and Plavix. He did well post cath. Follow up echo was done, but not resulted prior to discharge.    Labs the following morning showed stable Cr 1.52, and Hgb 13.3. Worked with cardiac rehab without any complaints, but recommended PT evaluation. PT recommended HHPT prior to discharge. He was seen by Dr. Angelena Form and determined stable for discharge home. Follow up in the office was arranged. Medications are listed below.  _____________  Discharge Vitals Blood pressure (!) 159/75, pulse 90, temperature 99.5 F (37.5 C), temperature source Oral, resp. rate 19, height 6' (1.829 m), weight 268 lb (121.6 kg), SpO2 93 %.  Filed Weights   10/09/16 0500 10/10/16 0800 10/11/16 0235  Weight: 268 lb 8.3 oz (121.8 kg) 268 lb 15.4 oz (122 kg) 268 lb (121.6 kg)    Labs & Radiologic Studies    CBC  Recent Labs  10/10/16 0225 10/11/16 0225  WBC 6.8 7.6  HGB 13.0 13.3  HCT 38.7* 41.2  MCV 95.8 98.3  PLT 203 524   Basic Metabolic Panel  Recent Labs  10/08/16 2001  10/10/16 0225 10/11/16 0225  NA  --   < > 138 140  K  --   < > 4.4 4.5  CL  --   < > 107 107  CO2  --   < > 23 22  GLUCOSE  --   < > 175* 119*  BUN  --   < > 16 11  CREATININE  --   < > 1.68* 1.52*  CALCIUM  --   < > 9.1 9.1  MG 1.8  --   --   --   < > = values in this interval not displayed. Liver Function Tests  Recent Labs  10/09/16 0506  AST 27  ALT 21  ALKPHOS 27*  BILITOT 0.7  PROT 7.0  ALBUMIN 3.5   No results for input(s):  LIPASE, AMYLASE in the last 72 hours. Cardiac Enzymes  Recent Labs  10/09/16 1152 10/09/16 1805 10/10/16 0225  TROPONINI 0.55* 0.51* 0.55*   BNP Invalid input(s): POCBNP D-Dimer No results for input(s): DDIMER in the last 72 hours. Hemoglobin A1C  Recent Labs  10/09/16 0506  HGBA1C 7.0*   Fasting Lipid Panel  Recent Labs  10/09/16 0506  CHOL 194  HDL 28*  LDLCALC 113*  TRIG 265*  CHOLHDL 6.9  Thyroid Function Tests  Recent Labs  10/08/16 2001  TSH 2.090   _____________  Dg Chest 2 View  Result Date: 10/08/2016 CLINICAL DATA:  Chest pain for 3 days, cough EXAM: CHEST  2 VIEW COMPARISON:  02/17/2016 FINDINGS: Prior CABG. Heart is normal size. Lungs are clear. No effusions. No acute bony abnormality. IMPRESSION: Prior CABG.  No active disease. Electronically Signed   By: Rolm Baptise M.D.   On: 10/08/2016 15:49   Disposition   Pt is being discharged home today in good condition.  Follow-up Plans & Appointments    Follow-up Information    Lyda Jester, PA-C Follow up on 10/17/2016.   Specialties:  Cardiology, Radiology Why:  at 9:30am for your follow up appt.  Contact information: [address] [place] [address] [phone]        [place] Follow up.   Specialty:  Home Health Services Why:  HHPT Contact information: [place] [address] [phone]          Discharge Instructions    Amb Referral to Cardiac Rehabilitation    Complete by:  As directed    Diagnosis:   Coronary Stents NSTEMI PTCA     Call MD for:  redness, tenderness, or signs of infection (pain, swelling, redness, odor or green/yellow discharge around incision site)    Complete by:  As directed    Diet - low sodium heart healthy    Complete by:  As directed    Discharge instructions    Complete by:  As directed    Groin Site Care Refer to this sheet in the next few weeks. These instructions provide  you with information on caring for yourself after your procedure. Your caregiver may also give you more specific instructions. Your treatment has been planned according to current medical practices, but problems sometimes occur. Call your caregiver if you have any problems or questions after your procedure. HOME CARE INSTRUCTIONS You may shower 24 hours after the procedure. Remove the bandage (dressing) and gently wash the site with plain soap and water. Gently pat the site dry.  Do not apply powder or lotion to the site.  Do not sit in a bathtub, swimming pool, or whirlpool for 5 to 7 days.  No bending, squatting, or lifting anything over 10 pounds (4.5 kg) as directed by your caregiver.  Inspect the site at least twice daily.  Do not drive home if you are discharged the same day of the procedure. Have someone else drive you.  You may drive 24 hours after the procedure unless otherwise instructed by your caregiver.  What to expect: Any bruising will usually fade within 1 to 2 weeks.  Blood that collects in the tissue (hematoma) may be painful to the touch. It should usually decrease in size and tenderness within 1 to 2 weeks.  SEEK IMMEDIATE MEDICAL CARE IF: You have unusual pain at the groin site or down the affected leg.  You have redness, warmth, swelling, or pain at the groin site.  You have drainage (other than a small amount of blood on the dressing).  You have chills.  You have a fever or persistent symptoms for more than 72 hours.  You have a fever and your symptoms suddenly get worse.  Your leg becomes pale, cool, tingly, or numb.  You have heavy bleeding from the site. Hold pressure on the site.   You will be on aspirin, plavix and  Eliquis for one month. Then stop the aspirin, but continue Eliquis and plavix.   Face-to-face encounter (required for Medicare/Medicaid patients)    Complete by:  As directed    I Reino Bellis certify that this patient is under my care and that I, or  a nurse practitioner or physician's assistant working with me, had a face-to-face encounter that meets the physician face-to-face encounter requirements with this patient on 10/11/2016. The encounter with the patient was in whole, or in part for the following medical condition(s) which is the primary reason for home health care (List medical condition): Deconditioning, CAD   The encounter with the patient was in whole, or in part, for the following medical condition, which is the primary reason for home health care:  Deconditioning, CAD   I certify that, based on my findings, the following services are medically necessary home health services:  Physical therapy   Reason for Medically Necessary Home Health Services:  Therapy- Personnel officer, Public librarian   My clinical findings support the need for the above services:  Unsafe ambulation due to balance issues   Further, I certify that my clinical findings support that this patient is homebound due to:  Unsafe ambulation due to balance issues   Home Health    Complete by:  As directed    To provide the following care/treatments:  PT   Increase activity slowly    Complete by:  As directed       Discharge Medications   Discharge Medication List as of 10/11/2016  1:46 PM    START taking these medications   Details  aspirin 81 MG chewable tablet Chew 1 tablet (81 mg total) by mouth daily., Starting Thu 10/11/2016, No Print    clopidogrel (PLAVIX) 75 MG tablet Take 1 tablet (75 mg total) by mouth daily with breakfast., Starting Fri 10/12/2016, Normal      CONTINUE these medications which have NOT CHANGED   Details  albuterol (PROVENTIL HFA;VENTOLIN HFA) 108 (90 BASE) MCG/ACT inhaler Inhale 2 puffs into the lungs every 6 (six) hours as needed for wheezing or shortness of breath. , Historical Med    amiodarone (PACERONE) 200 MG tablet Take 1 tablet (200 mg total) by mouth daily., Starting Tue 02/21/2016, No Print    apixaban  (ELIQUIS) 5 MG TABS tablet Take 5 mg by mouth 2 (two) times daily., Historical Med    atorvastatin (LIPITOR) 80 MG tablet Take 80 mg by mouth daily at 6 PM., Historical Med    buPROPion (WELLBUTRIN XL) 150 MG 24 hr tablet Take 150 mg by mouth every morning., Historical Med    capsaicin (ZOSTRIX) 0.025 % cream Apply 1 application topically 2 (two) times daily as needed (for pain). , Historical Med    Cholecalciferol 4000 UNITS CAPS Take 4,000 Units by mouth 3 (three) times daily., Historical Med    clotrimazole (LOTRIMIN) 1 % cream Apply 1 application topically 2 (two) times daily as needed (for rash). , Historical Med    ferrous sulfate dried (SLOW FE) 160 (50 FE) MG TBCR Take 160 mg by mouth 2 (two) times daily with a meal.  , Historical Med    gabapentin (NEURONTIN) 300 MG capsule Take 300 mg by mouth 2 times daily at 12 noon and 4 pm.  , Historical Med    glucose 4 GM chewable tablet Chew 1 tablet by mouth as needed for low blood sugar., Historical Med    hydrocortisone-pramoxine (ANALPRAM-HC) 2.5-1 % rectal cream Place  1 application rectally 3 (three) times daily as needed for hemorrhoids or itching. , Historical Med    hydrOXYzine (VISTARIL) 25 MG capsule Take 50 mg by mouth at bedtime. , Starting Sun 10/10/2014, Historical Med    Insulin Detemir (LEVEMIR FLEXPEN) 100 UNIT/ML Pen Inject 60 Units into the skin 2 (two) times daily. , Historical Med    insulin lispro (HUMALOG KWIKPEN) 100 UNIT/ML KiwkPen Inject 24 Units into the skin 3 (three) times daily. , Historical Med    isosorbide mononitrate (IMDUR) 30 MG 24 hr tablet Take 30 mg by mouth daily., Historical Med    ketoconazole (NIZORAL) 2 % cream Apply 1 application topically daily. For rash, Historical Med    levothyroxine (SYNTHROID, LEVOTHROID) 100 MCG tablet Take 100 mcg by mouth daily before breakfast. , Historical Med    LIDOCAINE EX Apply 1 application topically 2 (two) times daily as needed (for pain). , Historical Med      Linaclotide (LINZESS) 145 MCG CAPS capsule Take 145 mcg by mouth daily as needed (diarrhea). , Historical Med    Magnesium Oxide 420 MG TABS Take 420 mg by mouth daily. , Historical Med    metoprolol tartrate (LOPRESSOR) 25 MG tablet Take 0.5 tablets (12.5 mg total) by mouth 2 (two) times daily., Starting Fri 01/27/2016, Normal    mupirocin ointment (BACTROBAN) 2 % Place 1 application into the nose daily as needed (for mersa). , Historical Med    nitroGLYCERIN (NITROSTAT) 0.4 MG SL tablet Place 0.4 mg under the tongue every 5 (five) minutes as needed for chest pain (x 3 doses daily). , Historical Med    Omega-3 Fatty Acids (FISH OIL) 1000 MG CAPS Take 1,000 mg by mouth daily. , Historical Med    oxyCODONE-acetaminophen (PERCOCET) 10-325 MG per tablet Take 1 tablet by mouth every 4 (four) hours as needed for pain., Historical Med    pantoprazole (PROTONIX) 40 MG tablet Take 1 tablet (40 mg total) by mouth 2 (two) times daily., Starting Wed 10/20/2014, Normal    polyethylene glycol powder (GLYCOLAX/MIRALAX) powder DISSOLVE 17 GRAMS (1 CAPFUL) IN AT LEAST 8 OUNCES WATER/JUICE AND DRINK DAILY., Normal    fenofibrate 54 MG tablet TAKE 1 TABLET (54 MG TOTAL) BY MOUTH DAILY., Starting Wed 05/23/2016, Normal      STOP taking these medications     ibuprofen (ADVIL,MOTRIN) 800 MG tablet          Aspirin prescribed at discharge?  Yes High Intensity Statin Prescribed? (Lipitor 40-19m or Crestor 20-465m: Yes Beta Blocker Prescribed? Yes For EF <40%, was ACEI/ARB Prescribed? No: elevated Cr. ADP Receptor Inhibitor Prescribed? (i.e. Plavix etc.-Includes Medically Managed Patients): Yes For EF <40%, Aldosterone Inhibitor Prescribed? No: EF ok Was EF assessed during THIS hospitalization? Yes Was Cardiac Rehab II ordered? (Included Medically managed Patients): Yes   Outstanding Labs/Studies   N/A  Duration of Discharge Encounter   Greater than 30 minutes including physician  time.  Signed, LiReino BellisP-C 10/11/2016, 2:10 PM

## 2016-10-10 NOTE — Progress Notes (Signed)
Site area: right groin  Site Prior to Removal:  Level 0  Pressure Applied For 20 MINUTES    Minutes Beginning at 1405  Manual:   Yes.    Patient Status During Pull:  STABLE  Post Pull Groin Site:  Level 0  Post Pull Instructions Given:  Yes.    Post Pull Pulses Present:  Yes.    Dressing Applied:  Yes.    Comments:

## 2016-10-10 NOTE — Care Management Note (Signed)
Case Management Note  Patient Details  Name: Matthew [name] MRN: [id number] Date of Birth: June 20, 1945  Subjective/Objective:  From home , presents with NSTEMI, s/p coronary stent, will be on eliquis and plavix., patient was already on eliquis.                     Action/Plan: NCM will follow for dc needs.  Expected Discharge Date:                  Expected Discharge Plan:  Home/Self Care  In-House Referral:     Discharge planning Services  CM Consult  Post Acute Care Choice:    Choice offered to:     DME Arranged:    DME Agency:     HH Arranged:    HH Agency:     Status of Service:  In process, will continue to follow  If discussed at Long Length of Stay Meetings, dates discussed:    Additional Comments:  [name], RN 10/10/2016, 12:11 PM

## 2016-10-10 NOTE — Progress Notes (Signed)
Patient continue to have chest pain, 4.10 up on NTG drip. Medicated with xanax.  Called cardiology fellow regarding continuing chest pain.

## 2016-10-10 NOTE — H&P (View-Only) (Signed)
SUBJECTIVE: No chest pain this am  Tele: sinus brady  BP 108/65 (BP Location: Left Arm)   Pulse (!) 54   Temp 98.1 F (36.7 C) (Oral)   Resp 14   Ht 6' (1.829 m)   Wt 268 lb 8.3 oz (121.8 kg)   SpO2 95%   BMI 36.42 kg/m   Intake/Output Summary (Last 24 hours) at 10/09/16 3354 Last data filed at 10/09/16 0809  Gross per 24 hour  Intake           348.38 ml  Output             1875 ml  Net         -1526.62 ml    PHYSICAL EXAM General: Well developed, well nourished, in no acute distress. Alert and oriented x 3.  Psych:  Good affect, responds appropriately Neck: No JVD. No masses noted.  Lungs: Clear bilaterally with no wheezes or rhonci noted.  Heart: RRR with no murmurs noted. Abdomen: Bowel sounds are present. Soft, non-tender.  Extremities: No lower extremity edema.   LABS: Basic Metabolic Panel:  Recent Labs  10/08/16 1454 10/08/16 2001 10/09/16 0506  NA 136  --  136  K 4.2  --  4.3  CL 101  --  102  CO2 26  --  25  GLUCOSE 170*  --  173*  BUN 16  --  17  CREATININE 1.79*  --  1.56*  CALCIUM 9.0  --  9.4  MG  --  1.8  --    CBC:  Recent Labs  10/08/16 1454 10/09/16 0506  WBC 6.2 6.3  HGB 13.9 13.1  HCT 41.9 39.2  MCV 98.1 95.6  PLT 195 179   Cardiac Enzymes:  Recent Labs  10/08/16 2001  TROPONINI 0.50*   Fasting Lipid Panel:  Recent Labs  10/09/16 0506  CHOL 194  HDL 28*  LDLCALC 113*  TRIG 265*  CHOLHDL 6.9    Current Meds: . amiodarone  200 mg Oral Daily  . aspirin EC  81 mg Oral Daily  . atorvastatin  80 mg Oral q1800  . buPROPion  150 mg Oral q morning - 10a  . cholecalciferol  4,000 Units Oral TID  . fenofibrate  54 mg Oral Daily  . ferrous sulfate  325 mg Oral BID WC  . gabapentin  300 mg Oral q12n4p  . hydrOXYzine  50 mg Oral QHS  . insulin aspart  0-9 Units Subcutaneous Q4H  . insulin aspart  10 Units Subcutaneous TID WC  . insulin detemir  30 Units Subcutaneous BID  . isosorbide mononitrate  30 mg Oral  Daily  . levothyroxine  100 mcg Oral QAC breakfast  . magnesium oxide  400 mg Oral Daily  . metoprolol tartrate  12.5 mg Oral BID  . omega-3 acid ethyl esters  1 g Oral Daily  . pantoprazole  40 mg Oral BID  . sodium chloride flush  3 mL Intravenous Q12H     ASSESSMENT AND PLAN: 71 yo male with history of CAD s/p CABG, AAA, persistent AF, CKD stage 3, DM admitted with NSTEMI.   1. CAD s/p CABG/NSTEMI: Pain free this am. Troponin 0.5. He is on IV heparin while awaiting cath on Wednesday (Eliquis held starting 10/08/16 with pm dose). Continue ASA, high dose statin and beta blocker. Echo today to assess LVEF. He can eat today. NPO at midnight. I have personally reviewed his EKG today and it shows sinus brady  with RBBB, LAFB and old inferior Q waves.   2. HLD: Continue statin  3. Paroxysmal atrial fibrillation: He is in sinus this am. Continue amiodarone and beta blocker. Eliquis on hold for cath. He is now on IV heparin.   4. Chronic kidney disease, stage 3: renal function stable.   5. HTN: BP controlled.    Lauree Chandler  5/1/20188:12 AM

## 2016-10-10 NOTE — Progress Notes (Signed)
Less chest pain, sleeping, but sao2 falling to 82 % on 2lpm n/c patient refused CPAP earlier. Increased o2 to 6 lpm, still sats in 87% range. Placed CPAP back on patient.bleed in of 8 L o2. Patient sats coming up to 94%. Lungs sound clear diminished in the bases, as before.

## 2016-10-11 ENCOUNTER — Telehealth: Payer: Self-pay | Admitting: *Deleted

## 2016-10-11 ENCOUNTER — Inpatient Hospital Stay (HOSPITAL_COMMUNITY): Payer: 59

## 2016-10-11 DIAGNOSIS — R079 Chest pain, unspecified: Secondary | ICD-10-CM

## 2016-10-11 LAB — ECHOCARDIOGRAM COMPLETE
FS: 24 % — AB (ref 28–44)
HEIGHTINCHES: 72 in
IVS/LV PW RATIO, ED: 0.76
LA ID, A-P, ES: 42 mm
LA vol index: 29.2 mL/m2
LA vol: 70.4 mL
LADIAMINDEX: 1.74 cm/m2
LAVOLA4C: 69.5 mL
LEFT ATRIUM END SYS DIAM: 42 mm
LVOT area: 3.8 cm2
LVOTD: 22 mm
PW: 15.7 mm — AB (ref 0.6–1.1)
WEIGHTICAEL: 4288 [oz_av]

## 2016-10-11 LAB — BASIC METABOLIC PANEL
ANION GAP: 11 (ref 5–15)
BUN: 11 mg/dL (ref 6–20)
CALCIUM: 9.1 mg/dL (ref 8.9–10.3)
CO2: 22 mmol/L (ref 22–32)
Chloride: 107 mmol/L (ref 101–111)
Creatinine, Ser: 1.52 mg/dL — ABNORMAL HIGH (ref 0.61–1.24)
GFR calc non Af Amer: 44 mL/min — ABNORMAL LOW (ref >60)
GFR, EST AFRICAN AMERICAN: 51 mL/min — AB (ref >60)
Glucose, Bld: 119 mg/dL — ABNORMAL HIGH (ref 65–99)
Potassium: 4.5 mmol/L (ref 3.5–5.1)
Sodium: 140 mmol/L (ref 135–145)

## 2016-10-11 LAB — GLUCOSE, CAPILLARY
Glucose-Capillary: 128 mg/dL — ABNORMAL HIGH (ref 65–99)
Glucose-Capillary: 148 mg/dL — ABNORMAL HIGH (ref 65–99)

## 2016-10-11 LAB — CBC
HEMATOCRIT: 41.2 % (ref 39.0–52.0)
Hemoglobin: 13.3 g/dL (ref 13.0–17.0)
MCH: 31.7 pg (ref 26.0–34.0)
MCHC: 32.3 g/dL (ref 30.0–36.0)
MCV: 98.3 fL (ref 78.0–100.0)
Platelets: 189 10*3/uL (ref 150–400)
RBC: 4.19 MIL/uL — ABNORMAL LOW (ref 4.22–5.81)
RDW: 13.5 % (ref 11.5–15.5)
WBC: 7.6 10*3/uL (ref 4.0–10.5)

## 2016-10-11 MED ORDER — ASPIRIN 81 MG PO CHEW: 81.0000 mg | CHEWABLE_TABLET | Freq: Every day | ORAL | Status: DC

## 2016-10-11 MED ORDER — CLOPIDOGREL BISULFATE 75 MG PO TABS: 75.0000 mg | ORAL_TABLET | Freq: Every day | ORAL | 11 refills | Status: DC

## 2016-10-11 MED ORDER — APIXABAN 5 MG PO TABS
5.0000 mg | ORAL_TABLET | Freq: Two times a day (BID) | ORAL | Status: DC
  Administered 2016-10-11: 5 mg via ORAL
  Filled 2016-10-11: qty 1

## 2016-10-11 NOTE — Consult Note (Signed)
           [facility] Primary Care Navigator  10/11/2016  Matthew [name] [birth year] [id number]   Went to see patient in the roomto identify possible discharge needs but he was alreadydischarged per staff .  Inpatient CM note states that patient was discharged home with home health services (through [place] per wife instead of [facility]).    Primary care provider's office called [name]) to notify of patient's discharge and need for post hospital follow-up and transition of care. Notified also of patient's issues needing follow-up and will let Dr. Kathyrn Lass aware per [name].  [name] reported that patient had been called for transition of care. Made aware to refer patient to [facility] care management ifdeemed appropriatefor services.                                                                                                          For questions, please contact:  Dannielle Huh, BSN, RN- [facility] Primary Care Navigator  Telephone: [phone] [place]

## 2016-10-11 NOTE — Progress Notes (Signed)
CARDIAC REHAB PHASE I   PRE:  Rate/Rhythm: 72 afib    BP: sitting 159/75    SaO2: 94 2L, 89 RA  MODE:  Ambulation: 180 ft   POST:  Rate/Rhythm: 86 afib    BP: sitting 162/76     SaO2:    Upon arrival pt sts he is having left foot pain. On 2L. Sts he wears Cpap at home sometimes. Pt able to get up and walk on RA, I used gait belt, assist x1. He is unsteady as he sts his hips are very painful and weak due to lying in bed for days. His left foot pain is resolved. Only able to walk short distance due to hips, no LOB. To recliner. SaO2 ok on RA. Pt agreeable to HHPT for safety eval. As he is a moderate fall risk. Conversation with pt is difficult to follow as he sts contradicting statements. No family present. I made sure he understands the importance of meds, esp Plavix. He sts he doesn't normally take ASA but he will (along with Eliquis). Discussed increasing ex tol by walking as able and CRPII. Will send referral to [facility]. Gave diet and MI booklet. Pt voiced understanding but probably will do what he wants to do. [place], ACSM 10/11/2016 8:59 AM

## 2016-10-11 NOTE — Telephone Encounter (Signed)
-----   Message from Candis Schatz sent at 10/11/2016 10:43 AM EDT ----- Regarding: TOC call Pt will need a TOC call. Being discharged today.  Thanks [employer]

## 2016-10-11 NOTE — Discharge Instructions (Addendum)

## 2016-10-11 NOTE — Progress Notes (Signed)
  Echocardiogram 2D Echocardiogram has been performed.  Matthew [name] 10/11/2016, 10:30 AM

## 2016-10-11 NOTE — Evaluation (Signed)
Physical Therapy Evaluation Patient Details Name: Matthew [name] MRN: [id number] DOB: 1946-05-24 Today's Date: 10/11/2016   History of Present Illness  The patient is a 71 y.o. male with history of ASCAD s/p CABG x 4 in 2010, AAA, persistent atrial fibrillation, CKD stage III, and DM. He presented to [facility] ER with complaints of chest pain. He was transfered to [facility] for admission with dx of NSTEMI. He underwent cardiac cath 10/10/16.  Clinical Impression  PT eval complete. Pt demo mod I bed mobility. Supervision provided for transfers and min guard assist ambulation without AD 300 feet. Pt on RA with sats in upper 80s during mobility. Pt demonstrating mild SOB. Educated on pursed lip breathing. Plan is for d/c home today with wife. Recommend HHPT for safety eval. PT signing off.    Follow Up Recommendations Home health PT;Supervision for mobility/OOB (for home safety eval)    Equipment Recommendations  None recommended by PT    Recommendations for Other Services       Precautions / Restrictions Precautions Precautions: Other (comment) Precaution Comments: watch O2 sats      Mobility  Bed Mobility Overal bed mobility: Modified Independent             General bed mobility comments: +rail, increased time  Transfers Overall transfer level: Needs assistance Equipment used: None Transfers: Sit to/from [employer] Transfers Sit to Stand: Supervision Stand pivot transfers: Supervision       General transfer comment: supervision for safety. No physical assist.  Ambulation/Gait Ambulation/Gait assistance: Min guard Ambulation Distance (Feet): 300 Feet Assistive device: None Gait Pattern/deviations: Step-through pattern;Decreased stride length Gait velocity: mildly decreased Gait velocity interpretation: Below normal speed for age/gender General Gait Details: Steady gait. Pt ambulated on RA. Mild SOB noted with sats in upper 80s during mobility.  Stairs             Wheelchair Mobility    Modified Rankin (Stroke Patients Only)       Balance Overall balance assessment: No apparent balance deficits (not formally assessed)                                           Pertinent Vitals/Pain Pain Assessment: Faces Faces Pain Scale: Hurts a little bit Pain Location: "my hips are sore" Pain Descriptors / Indicators: Sore Pain Intervention(s): Monitored during session    Home Living Family/patient expects to be discharged to:: Private residence Living Arrangements: Spouse/significant other Available Help at Discharge: Family Type of Home: Apartment Home Access: Level entry     Home Layout: Two level Home Equipment: Shower seat      Prior Function Level of Independence: Independent         Comments: still drives. Ambulates in community.     Hand Dominance   Dominant Hand: Right    Extremity/Trunk Assessment   Upper Extremity Assessment Upper Extremity Assessment: Overall WFL for tasks assessed    Lower Extremity Assessment Lower Extremity Assessment: Overall WFL for tasks assessed    Cervical / Trunk Assessment Cervical / Trunk Assessment: Normal  Communication   Communication: No difficulties  Cognition Arousal/Alertness: Awake/alert Behavior During Therapy: WFL for tasks assessed/performed Overall Cognitive Status: Within Functional Limits for tasks assessed  General Comments      Exercises     Assessment/Plan    PT Assessment All further PT needs can be met in the next venue of care  PT Problem List Decreased activity tolerance;Cardiopulmonary status limiting activity;Decreased mobility;Decreased safety awareness       PT Treatment Interventions      PT Goals (Current goals can be found in the Care Plan section)  Acute Rehab PT Goals Patient Stated Goal: home today PT Goal Formulation: All assessment and education complete, DC  therapy    Frequency     Barriers to discharge        Co-evaluation               AM-PAC PT "6 Clicks" Daily Activity  Outcome Measure Difficulty turning over in bed (including adjusting bedclothes, sheets and blankets)?: None Difficulty moving from lying on back to sitting on the side of the bed? : A Little Difficulty sitting down on and standing up from a chair with arms (e.g., wheelchair, bedside commode, etc,.)?: None Help needed moving to and from a bed to chair (including a wheelchair)?: None Help needed walking in hospital room?: None Help needed climbing 3-5 steps with a railing? : A Little 6 Click Score: 22    End of Session Equipment Utilized During Treatment: Gait belt Activity Tolerance: Patient tolerated treatment well Patient left: in bed;with call bell/phone within reach Nurse Communication: Mobility status PT Visit Diagnosis: Difficulty in walking, not elsewhere classified (R26.2)    Time: 6384-5364 PT Time Calculation (min) (ACUTE ONLY): 15 min   Charges:   PT Evaluation $PT Eval Low Complexity: 1 Procedure     PT G Codes:        Lorrin Goodell, PT  Office # [phone] Pager [phone]   Lorriane Shire 10/11/2016, 11:40 AM

## 2016-10-11 NOTE — Progress Notes (Addendum)
SUBJECTIVE: NO chest pain. Only c/o foot pain with chronic gout.   Tele: sinus  BP (!) 149/71   Pulse 63   Temp 98 F (36.7 C) (Oral)   Resp (!) 21   Ht 6' (1.829 m)   Wt 268 lb (121.6 kg)   SpO2 94%   BMI 36.35 kg/m   Intake/Output Summary (Last 24 hours) at 10/11/16 0742 Last data filed at 10/11/16 0558  Gross per 24 hour  Intake          3014.83 ml  Output             4225 ml  Net         -1210.17 ml    PHYSICAL EXAM General: Well developed, well nourished, in no acute distress. Alert and oriented x 3.  Psych:  Good affect, responds appropriately Neck: No JVD. No masses noted.  Lungs: Clear bilaterally with no wheezes or rhonci noted.  Heart: RRR with no murmurs noted. Abdomen: Bowel sounds are present. Soft, non-tender.  Extremities: No lower extremity edema.   LABS: Basic Metabolic Panel:  Recent Labs  10/08/16 2001  10/10/16 0225 10/11/16 0225  NA  --   < > 138 140  K  --   < > 4.4 4.5  CL  --   < > 107 107  CO2  --   < > 23 22  GLUCOSE  --   < > 175* 119*  BUN  --   < > 16 11  CREATININE  --   < > 1.68* 1.52*  CALCIUM  --   < > 9.1 9.1  MG 1.8  --   --   --   < > = values in this interval not displayed. CBC:  Recent Labs  10/10/16 0225 10/11/16 0225  WBC 6.8 7.6  HGB 13.0 13.3  HCT 38.7* 41.2  MCV 95.8 98.3  PLT 203 189   Cardiac Enzymes:  Recent Labs  10/09/16 1152 10/09/16 1805 10/10/16 0225  TROPONINI 0.55* 0.51* 0.55*   Fasting Lipid Panel:  Recent Labs  10/09/16 0506  CHOL 194  HDL 28*  LDLCALC 113*  TRIG 265*  CHOLHDL 6.9    Current Meds: . amiodarone  200 mg Oral Daily  . aspirin  81 mg Oral Daily  . atorvastatin  80 mg Oral q1800  . buPROPion  150 mg Oral q morning - 10a  . cholecalciferol  4,000 Units Oral TID  . clopidogrel  75 mg Oral Q breakfast  . fenofibrate  54 mg Oral Daily  . ferrous sulfate  325 mg Oral BID WC  . gabapentin  300 mg Oral q12n4p  . hydrOXYzine  50 mg Oral QHS  . insulin aspart   0-5 Units Subcutaneous QHS  . insulin aspart  0-9 Units Subcutaneous TID WC  . insulin aspart  10 Units Subcutaneous TID WC  . insulin detemir  30 Units Subcutaneous BID  . isosorbide mononitrate  30 mg Oral Daily  . levothyroxine  100 mcg Oral QAC breakfast  . magnesium oxide  400 mg Oral Daily  . metoprolol tartrate  12.5 mg Oral BID  . omega-3 acid ethyl esters  1 g Oral Daily  . pantoprazole  40 mg Oral BID  . sodium chloride flush  3 mL Intravenous Q12H     ASSESSMENT AND PLAN:  1. CAD s/p CABG/NSTEMI: Pt admitted with NSTEMI. Cardiac cath 10/10/16 and found to have severe stenosis in the vein  graft to the OM, treated with DES x 1. He is now on ASA/Plavix. Will continue DAPT for one month then will stop ASA and continue Plavix with Eliquis.  Continue statin, beta blocker, Imdur.  Will plan outpatient echo.   2. HLD: He is on a statin.   3. Paroxysmal atrial fibrillation: Sinus this am. Continue amiodarone. Resume Eliquis today.    4. Chronic kidney disease, stage 3: renal function stable post cath   5. HTN: BP controlled.  Discharge home today. Follow up with DR. [place] or office APP in 1-2 weeks. New med is plavix and metoprolol. He will need an outpatient echo. No need to wait all day today for echo here.   Lauree Chandler  5/3/20187:42 AM

## 2016-10-11 NOTE — Care Management Note (Addendum)
Case Management Note  Patient Details  Name: Matthew [name] MRN: [id number] Date of Birth: [birth year]  Subjective/Objective:    From home  With wife, presents with NSTEMI, s/p coronary stent, will be on eliquis and plavix., patient was already on eliquis , per pt eval rec HHPT, NCM spoke with patient and offered choice, he chose [facility].  Referral given to [facility] with [facility], soc will begin 24-48 hrs post dc.   5/3 1515 NCM received call from wife stating she wants to cancel [facility] services with Nanine Means she would like for the [place] to handle the [facility] services, she states if you could just fax everything over to them. NCM has faxed information to [place] at [phone].  NCM also informed Dian Situ with [facility] Means to cancel the HHPT.                Action/Plan:   Expected Discharge Date:  10/11/16               Expected Discharge Plan:  [place]  In-House Referral:     Discharge planning Services  CM Consult  Post Acute Care Choice:  Home Health Choice offered to:  Patient  DME Arranged:    DME Agency:     HH Arranged:  PT [place]:  [place]  Status of Service:  Completed, signed off  If discussed at [place] of Stay Meetings, dates discussed:    Additional Comments:  [name], RN 10/11/2016, 1:59 PM

## 2016-10-12 NOTE — Telephone Encounter (Signed)
TOC attempt #1-no answer, lmtcb.

## 2016-10-12 NOTE — Telephone Encounter (Signed)
Patient contacted regarding discharge from [facility] on 10/11/16.  Spoke to wife (ok per DPR)    Patient understands to follow up with provider Ellen Henri PA on 10/17/16 at 9:30AM at [facility] office.  Patient understands discharge instructions? yes  Patient understands medications and regiment? yes  Patient understands to bring all medications to this visit? yes

## 2016-10-16 ENCOUNTER — Encounter: Payer: Self-pay | Admitting: Cardiology

## 2016-10-17 ENCOUNTER — Encounter: Payer: Self-pay | Admitting: Cardiology

## 2016-10-17 ENCOUNTER — Ambulatory Visit (INDEPENDENT_AMBULATORY_CARE_PROVIDER_SITE_OTHER): Payer: 59 | Admitting: Cardiology

## 2016-10-17 ENCOUNTER — Telehealth (HOSPITAL_COMMUNITY): Payer: Self-pay | Admitting: Family Medicine

## 2016-10-17 VITALS — BP 124/64 | HR 69 | Ht 72.0 in | Wt 271.0 lb

## 2016-10-17 DIAGNOSIS — E785 Hyperlipidemia, unspecified: Secondary | ICD-10-CM

## 2016-10-17 DIAGNOSIS — I200 Unstable angina: Secondary | ICD-10-CM

## 2016-10-17 NOTE — Patient Instructions (Addendum)
Medication Instructions:   Your physician recommends that you continue on your current medications as directed. Please refer to the Current Medication list given to you today.   If you need a refill on your cardiac medications before your next appointment, please call your pharmacy.  Labwork: FASTING LIPID AND LIVER IN 6 WEEKS   Testing/Procedures: NONE ORDERED  TODAY    Follow-Up: WITH DR Irish Lack IIN 8 WEEK S   Any Other Special Instructions Will Be Listed Below (If Applicable).

## 2016-10-17 NOTE — Progress Notes (Signed)
10/17/2016 Matthew [name]   [birth year]  [id number]  Primary Physician Via, Lennette Bihari, MD Primary Cardiologist: Dr. Irish Lack   Reason for Visit/CC: [name] F/u for CAD, s/p NSTEMI w/ PCI   HPI:  Matthew [name] is a 71 y.o. male who is being seen today for post hospital f/u after recent admission for chest pain in which he ruled in for non-STEMI and underwent PCI. He is well-known to me. I have seen him several times before. He is followed by Dr. Irish Lack. Also a veteran and receives his primary care as well as his medications from the [facility].  His cardiac history is significant for coronary artery disease as well as paroxysmal atrial fibrillation. He is on Eliquis for anticoagulation as well as amiodarone for rhythm control. He has a history of CABG 4 in 2010, AAA and carotid artery disease (both followed at the [place]) , chronic kidney disease and diabetes. He initially presented to [facility] emergency department on 10/08/16 with chest discomfort and fatigue similar to his previous angina. He ruled in for non-STEMI with troponin peaking at 0.55. EKG on admission showed sinus bradycardia with right bundle branch block and old inferior Q waves. His Eliquis was held for anticipated cath and he was transferred to [facility] for further management. On 10/10/16, he underwent left heart catheterization per Dr. Claiborne Billings. Angiographic details are outlined in cath report below. He essentially underwent PCI utilizing a drug-eluting stent to the saphenous vein graft to OM2/OM 3 which was 95% stenosed. Echocardiogram showed normal left ventricular ejection fraction of 55-60%. He was placed on triple therapy with aspirin, Plavix and continued on Eliquis. Plan is to continue triple therapy for one month. After 30 days, aspirin will be discontinued. He will continue on Plavix and Eliquis.  His statin medications were also adjusted by the [place].  LDL was noted to be elevated at 113 mg/dL. He was recently  changed from Lipitor to Crestor.  He was discharged home in stable condition on 10/11/2016.  He presents back to clinic today for post hospital follow-up. He is accompanied by his wife. He reports that he has felt well. He denies any recurrent chest pain. No dyspnea nor fatigue. He reports full medication compliance. He denies any abnormal bleeding with triple therapy. He is tolerating Crestor well. He denies any post-cath complications. His femoral cath site has remained stable. He is ambulating without difficulty. He denies any palpitations. Blood pressure is well-controlled as well as his heart rate.   LHC 10/10/16  Conclusion     Prox LAD to Mid LAD lesion, 100 %stenosed.  Prox RCA to Mid RCA lesion, 100 %stenosed.  Mid Cx lesion, 95 %stenosed.  Ost 2nd Mrg to 2nd Mrg lesion, 100 %stenosed.  Dist Cx lesion, 100 %stenosed.  LIMA and is normal in caliber and anatomically normal.  SVG and is normal in caliber and anatomically normal.  Seq SVG- OM2-OM3 and is normal in caliber.  Dist Graft-2 lesion, 50 %stenosed.  SVG.  Origin lesion, 100 %stenosed.  A STENT SYNERGY DES 3X28 drug eluting stent was successfully placed.  Dist Graft-1 lesion, 95 %stenosed.  Post intervention, there is a 0% residual stenosis.  Prox Graft to Mid Graft lesion, 25 %stenosed.   Severe native CAD with total occlusion of the proximal LAD at a site of prior proximal stenting after a small diagonal vessel; subtotal occlusion of the circumflex vessel prior to the OM2, with occlusion of the ostium of the OM1, and diffuse  95% mid AV groove stenosis with total distal occlusion before the distal marginal branch; and total proximal RCA occlusion.  Patent LIMA graft which supplies the mid LAD and 2 diagonal vessels.  The sequential vein graft which supplies a large OM 2 - OM3  has mild diffuse 20-30% stenosis proximally in the graft and in the mid body of the graft there is a sequential stenosis of 95%,  followed by eccentric 50% narrowing with TIMI-3 flow down both marginal branches.  Ostially occluded vein graft which has supplied a diagonal vessel.  Patent SVG to the distal RCA.  Successful PCI to the sequential SVG supplying the marginal vessels with ultimate insertion of a 3.028 mm Synergy DES stent to cover both lesions, postdilated to 3.18 mm with the 95 and 50% stenoses being reduced to 0%.  There is brisk TIMI-3 flow down to both marginal branches.      Current Meds  Medication Sig  . albuterol (PROVENTIL HFA;VENTOLIN HFA) 108 (90 BASE) MCG/ACT inhaler Inhale 2 puffs into the lungs every 6 (six) hours as needed for wheezing or shortness of breath.   Marland Kitchen amiodarone (PACERONE) 200 MG tablet Take 1 tablet (200 mg total) by mouth daily. (Patient taking differently: Take 100 mg by mouth 2 (two) times daily. )  . apixaban (ELIQUIS) 5 MG TABS tablet Take 5 mg by mouth 2 (two) times daily.  Marland Kitchen aspirin 81 MG chewable tablet Chew 1 tablet (81 mg total) by mouth daily.  Marland Kitchen buPROPion (WELLBUTRIN XL) 150 MG 24 hr tablet Take 150 mg by mouth every morning.  . capsaicin (ZOSTRIX) 0.025 % cream Apply 1 application topically 2 (two) times daily as needed (for pain).   . Cholecalciferol 4000 UNITS CAPS Take 4,000 Units by mouth 3 (three) times daily.  . clopidogrel (PLAVIX) 75 MG tablet Take 1 tablet (75 mg total) by mouth daily with breakfast.  . clotrimazole (LOTRIMIN) 1 % cream Apply 1 application topically 2 (two) times daily as needed (for rash).   . ferrous sulfate dried (SLOW FE) 160 (50 FE) MG TBCR Take 160 mg by mouth 2 (two) times daily with a meal.    . gabapentin (NEURONTIN) 300 MG capsule Take 300 mg by mouth 2 times daily at 12 noon and 4 pm.    . glucose 4 GM chewable tablet Chew 1 tablet by mouth as needed for low blood sugar.  . hydrocortisone-pramoxine (ANALPRAM-HC) 2.5-1 % rectal cream Place 1 application rectally 3 (three) times daily as needed for hemorrhoids or itching.   .  hydrOXYzine (VISTARIL) 25 MG capsule Take 50 mg by mouth at bedtime.   . Insulin Detemir (LEVEMIR FLEXPEN) 100 UNIT/ML Pen Inject 60 Units into the skin 2 (two) times daily.   . insulin lispro (HUMALOG KWIKPEN) 100 UNIT/ML KiwkPen Inject 24 Units into the skin 3 (three) times daily.   . isosorbide mononitrate (IMDUR) 30 MG 24 hr tablet Take 30 mg by mouth daily.  Marland Kitchen ketoconazole (NIZORAL) 2 % cream Apply 1 application topically daily. For rash  . levothyroxine (SYNTHROID, LEVOTHROID) 100 MCG tablet Take 100 mcg by mouth daily before breakfast.   . LIDOCAINE EX Apply 1 application topically 2 (two) times daily as needed (for pain).   . Linaclotide (LINZESS) 145 MCG CAPS capsule Take 145 mcg by mouth daily as needed (diarrhea).   . Magnesium Oxide 420 MG TABS Take 420 mg by mouth daily.   . metoprolol tartrate (LOPRESSOR) 25 MG tablet Take 0.5 tablets (12.5 mg total) by  mouth 2 (two) times daily.  . mupirocin ointment (BACTROBAN) 2 % Place 1 application into the nose daily as needed (for mersa).   . nitroGLYCERIN (NITROSTAT) 0.4 MG SL tablet Place 0.4 mg under the tongue every 5 (five) minutes as needed for chest pain (x 3 doses daily).   . Omega-3 Fatty Acids (FISH OIL) 1000 MG CAPS Take 1,000 mg by mouth daily.   Marland Kitchen oxyCODONE-acetaminophen (PERCOCET) 10-325 MG per tablet Take 1 tablet by mouth every 4 (four) hours as needed for pain.  . pantoprazole (PROTONIX) 40 MG tablet Take 1 tablet (40 mg total) by mouth 2 (two) times daily.  . polyethylene glycol powder (GLYCOLAX/MIRALAX) powder DISSOLVE 17 GRAMS (1 CAPFUL) IN AT LEAST 8 OUNCES WATER/JUICE AND DRINK DAILY. (Patient taking differently: DISSOLVE 17 GRAMS (1 CAPFUL) IN AT LEAST 8 OUNCES WATER/JUICE AND DRINK DAILY AS NEEDED FOR CONSTIPATION)  . rosuvastatin (CRESTOR) 40 MG tablet Take 40 mg by mouth daily.  . [DISCONTINUED] atorvastatin (LIPITOR) 80 MG tablet Take 80 mg by mouth daily at 6 PM.   Allergies  Allergen Reactions  . Tetanus Toxoids  Hives   Past Medical History:  Diagnosis Date  . AAA (abdominal aortic aneurysm) ([place])    a. Aortic US 05/2012: 3.9 cm fusiform abdominal aortic aneurysm. F/u recommended 05/2014.  Marland Kitchen Anemia   . Atrial fibrillation ([place])    a. Post-op CABG 05/2009. b. Re-identified 12/2013.   Marland Kitchen CAD (coronary artery disease)    a. s/p CABGx4 05/2009. 10/10/16 PCI with DES x1 SVG-->OM2/OM3  . Chronic constipation   . CKD (chronic kidney disease), stage III   . Cocaine abuse in remission   . Colon polyps   . Diabetes mellitus   . Empyema lung ([place])    a. PNA 02/2011 c/b empyema requiring chest tube, drainage of pleural effusion and decortication.  . Enlarged prostate   . Hepatic steatosis   . Hyperlipidemia   . Hypertension   . Hypothyroidism   . Iron deficiency anemia   . LAFB (left anterior fascicular block)   . Lung cancer ([place])    a. Tx with interleukin in 2002 per patient.  . Nodule of left lung    a. Cavitary nodules 02/2011, f/u recommended.  . Obesity   . Paroxysmal supraventricular tachycardia ([place])    a. While inpatient 02/2011 sick with empyema - wide appearace due to RBBB.  . RBBB   . Renal cell carcinoma    a. s/p R nephrectomy 2002. Reported pulmonary mets per note at that time.  . Scrotal abscess    a. 2012 2/2 Streptococcus group C, status post incision and drainage.  Marland Kitchen Ulcerative proctitis ([place])    a. Remote dx dating back to 1979.  . Vitamin D deficiency    Family History  Problem Relation Age of Onset  . Heart disease Father   . Alzheimer's disease Mother   . Pneumonia    . Colon cancer Neg Hx   . Esophageal cancer Neg Hx   . Stomach cancer Neg Hx   . Rectal cancer Neg Hx    Past Surgical History:  Procedure Laterality Date  . Bronchoscopy, right video-assisted thoracoscopy, minithoracotomy, drainage of pleural effusion, and decortication.  02/26/2011   [place]  . CABG x 4  05/13/2009   [place]  . CARDIAC SURGERY    . CORONARY STENT INTERVENTION N/A 10/10/2016    Procedure: Coronary Stent Intervention;  Surgeon: Troy Sine, MD;  Location: [place] [facility];  Service: Cardiovascular;  Laterality: N/A;  . HEMORRHOID SURGERY    . HERNIA REPAIR    . Incision, drainage and debridement of perineum and  scrotum    . Irrigation and debridement of scrotal wound and closure of scrotal wound.    Marland Kitchen LEFT HEART CATH AND CORS/GRAFTS ANGIOGRAPHY N/A 10/10/2016   Procedure: Left Heart Cath and Cors/Grafts Angiography;  Surgeon: Troy Sine, MD;  Location: [place] [facility];  Service: Cardiovascular;  Laterality: N/A;  . Right radical nephrectomy.    . TOOTH EXTRACTION     Social History   Social History  . Marital status: Married    Spouse name: N/A  . Number of children: 4  . Years of education: N/A   Occupational History  . 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 Smoking status: Never Smoker  . Smokeless tobacco: Never Used  . Alcohol use No  . Drug use: Yes    Types: Cocaine     Comment: stopped cocaine 2 and 1/2 years ago  . Sexual activity: Not on file   Other Topics Concern  . Not on file   Social History Narrative  . No narrative on file     Review of Systems: General: negative for chills, fever, night sweats or weight changes.  Cardiovascular: negative for chest pain, dyspnea on exertion, edema, orthopnea, palpitations, paroxysmal nocturnal dyspnea or shortness of breath Dermatological: negative for rash Respiratory: negative for cough or wheezing Urologic: negative for hematuria Abdominal: negative for nausea, vomiting, diarrhea, bright red blood per rectum, melena, or hematemesis Neurologic: negative for visual changes, syncope, or dizziness All other systems reviewed and are otherwise negative except as noted above.   Physical Exam:  Blood pressure 124/64, pulse 69, height 6' (1.829 m), weight 271 lb (122.9 kg), SpO2 96 %.  General appearance: alert, cooperative, no distress and moderately obese  Neck: no  carotid bruit and no JVD Lungs: clear to auscultation bilaterally Heart: regular rate and rhythm, S1, S2 normal, no murmur, click, rub or gallop Extremities: extremities normal, atraumatic, no cyanosis or edema Pulses: 2+ and symmetric Skin: Skin color, texture, turgor normal. No rashes or lesions Neurologic: Grossly normal  EKG not performed -- personally reviewed   ASSESSMENT AND PLAN:   1. CAD: s/p CABG in 2010. Recent NSTEMI with LHC 10/10/16 showing 95% occluded SVG>>OM1.OM2, s/p PCI + DES. EF normal by echo. Continue Triple therapy with ASA, Plavix and Eliquis for a total of 30 days (stop ASA on 11/11/16>>> Plavix + Eliquis thereafter). Continue Crestor, metoprolol and Imdur.    2. HTN: BP is well controlled on current regimen.  3. HLD: followed at [place]. Recent hospital lipid panel showed LDL elevated at 113 mg/dL. Meds recently adjusted by the [place]. Lipitor was discontinued and Crestor added. Repeat FLP and HFTs in 6 weeks.   4. [facility]  5. Carotid Artery Disease: patient notes yearly f/u conducted at [place]. He just had this done and reports stable findings. Continue Plavix, Eliquis and statin. BP is controlled.   6. AAA: [facility]. Denies tobacco use. BP is well controlled.   7. PAF: currently in NSR on amiodarone. Thyroid and hepatic function monitored at [facility]. He is on Eliquis for a/c and denies any abnormal bleeding or falls.   Plan: continue medical therapy for CAD. Triple therapy x 30 days given newly placed DES. Stop ASA on 11/11/16 and continue Plavix and Eliquis thereafter. Recheck FLP and HFTs in 6 weeks  to ensure LDL is lower and at goal with Crestor. F/u with Dr. Irish Lack in 8 weeks.   Moe Graca Ladoris Gene, [facility] 10/17/2016 2:33 PM

## 2016-10-17 NOTE — Telephone Encounter (Signed)
Verified UHC & Medicare A/B  insurance benefits through [employer]. No Copay, Coinsurance 20%, Deductible $2700.00, pt has met all. Out of Pocket $5400.00, pt has met $3110.86. Reference [phone] & [phone]... KJ

## 2016-10-23 ENCOUNTER — Telehealth (HOSPITAL_COMMUNITY): Payer: Self-pay

## 2016-10-23 NOTE — Telephone Encounter (Signed)
S/w pt not sure if he will do program but will c/b if interested. Advised pt if interested we will need to contact [facility] to get [facility] and other information from visit.... KJ

## 2016-10-29 DIAGNOSIS — I2510 Atherosclerotic heart disease of native coronary artery without angina pectoris: Secondary | ICD-10-CM | POA: Diagnosis not present

## 2016-10-29 DIAGNOSIS — F329 Major depressive disorder, single episode, unspecified: Secondary | ICD-10-CM | POA: Diagnosis not present

## 2016-10-29 DIAGNOSIS — E785 Hyperlipidemia, unspecified: Secondary | ICD-10-CM | POA: Diagnosis not present

## 2016-10-29 DIAGNOSIS — E119 Type 2 diabetes mellitus without complications: Secondary | ICD-10-CM | POA: Diagnosis not present

## 2016-10-29 DIAGNOSIS — R05 Cough: Secondary | ICD-10-CM | POA: Diagnosis not present

## 2016-10-29 DIAGNOSIS — Z794 Long term (current) use of insulin: Secondary | ICD-10-CM | POA: Diagnosis not present

## 2016-10-29 DIAGNOSIS — E039 Hypothyroidism, unspecified: Secondary | ICD-10-CM | POA: Diagnosis not present

## 2016-10-29 DIAGNOSIS — N183 Chronic kidney disease, stage 3 (moderate): Secondary | ICD-10-CM | POA: Diagnosis not present

## 2016-11-16 ENCOUNTER — Encounter: Payer: Self-pay | Admitting: *Deleted

## 2016-11-18 DIAGNOSIS — E161 Other hypoglycemia: Secondary | ICD-10-CM | POA: Diagnosis not present

## 2016-11-18 DIAGNOSIS — E162 Hypoglycemia, unspecified: Secondary | ICD-10-CM | POA: Diagnosis not present

## 2016-11-28 ENCOUNTER — Other Ambulatory Visit: Payer: Self-pay

## 2016-12-05 ENCOUNTER — Encounter: Payer: Self-pay | Admitting: Internal Medicine

## 2016-12-13 ENCOUNTER — Ambulatory Visit: Payer: 59 | Admitting: Interventional Cardiology

## 2017-02-27 ENCOUNTER — Encounter (HOSPITAL_COMMUNITY): Payer: Self-pay | Admitting: *Deleted

## 2017-02-27 ENCOUNTER — Emergency Department (HOSPITAL_COMMUNITY)
Admission: EM | Admit: 2017-02-27 | Discharge: 2017-02-27 | Disposition: A | Discharge status: Home / Self Care | Payer: 59 | Attending: Emergency Medicine | Admitting: Emergency Medicine

## 2017-02-27 ENCOUNTER — Emergency Department (HOSPITAL_COMMUNITY): Payer: 59

## 2017-02-27 DIAGNOSIS — I2510 Atherosclerotic heart disease of native coronary artery without angina pectoris: Secondary | ICD-10-CM | POA: Insufficient documentation

## 2017-02-27 DIAGNOSIS — Z79899 Other long term (current) drug therapy: Secondary | ICD-10-CM | POA: Diagnosis not present

## 2017-02-27 DIAGNOSIS — R51 Headache: Secondary | ICD-10-CM | POA: Diagnosis present

## 2017-02-27 DIAGNOSIS — N183 Chronic kidney disease, stage 3 (moderate): Secondary | ICD-10-CM | POA: Diagnosis not present

## 2017-02-27 DIAGNOSIS — F0781 Postconcussional syndrome: Secondary | ICD-10-CM | POA: Insufficient documentation

## 2017-02-27 DIAGNOSIS — E039 Hypothyroidism, unspecified: Secondary | ICD-10-CM | POA: Insufficient documentation

## 2017-02-27 DIAGNOSIS — E119 Type 2 diabetes mellitus without complications: Secondary | ICD-10-CM | POA: Diagnosis not present

## 2017-02-27 DIAGNOSIS — R079 Chest pain, unspecified: Secondary | ICD-10-CM

## 2017-02-27 DIAGNOSIS — I129 Hypertensive chronic kidney disease with stage 1 through stage 4 chronic kidney disease, or unspecified chronic kidney disease: Secondary | ICD-10-CM | POA: Diagnosis not present

## 2017-02-27 DIAGNOSIS — Z794 Long term (current) use of insulin: Secondary | ICD-10-CM | POA: Insufficient documentation

## 2017-02-27 DIAGNOSIS — D649 Anemia, unspecified: Secondary | ICD-10-CM | POA: Diagnosis not present

## 2017-02-27 LAB — BASIC METABOLIC PANEL
ANION GAP: 10 (ref 5–15)
BUN: 20 mg/dL (ref 6–20)
CALCIUM: 9.4 mg/dL (ref 8.9–10.3)
CO2: 21 mmol/L — ABNORMAL LOW (ref 22–32)
CREATININE: 1.84 mg/dL — AB (ref 0.61–1.24)
Chloride: 102 mmol/L (ref 101–111)
GFR, EST AFRICAN AMERICAN: 40 mL/min — AB (ref >60)
GFR, EST NON AFRICAN AMERICAN: 35 mL/min — AB (ref >60)
Glucose, Bld: 245 mg/dL — ABNORMAL HIGH (ref 65–99)
Potassium: 4.8 mmol/L (ref 3.5–5.1)
Sodium: 133 mmol/L — ABNORMAL LOW (ref 135–145)

## 2017-02-27 LAB — URINALYSIS, ROUTINE W REFLEX MICROSCOPIC
Bacteria, UA: NONE SEEN
Bilirubin Urine: NEGATIVE
Glucose, UA: >=500 mg/dL — AB
Ketones, ur: NEGATIVE mg/dL
Leukocytes, UA: NEGATIVE
Nitrite: NEGATIVE
Protein, ur: NEGATIVE mg/dL
Specific Gravity, Urine: 1.007 (ref 1.005–1.030)
Squamous Epithelial / LPF: NONE SEEN
pH: 5 (ref 5.0–8.0)

## 2017-02-27 LAB — CBC
HCT: 41.6 % (ref 39.0–52.0)
HEMOGLOBIN: 13.8 g/dL (ref 13.0–17.0)
MCH: 30.7 pg (ref 26.0–34.0)
MCHC: 33.2 g/dL (ref 30.0–36.0)
MCV: 92.7 fL (ref 78.0–100.0)
PLATELETS: 196 10*3/uL (ref 150–400)
RBC: 4.49 MIL/uL (ref 4.22–5.81)
RDW: 13.2 % (ref 11.5–15.5)
WBC: 6.4 10*3/uL (ref 4.0–10.5)

## 2017-02-27 LAB — I-STAT TROPONIN, ED: TROPONIN I, POC: 0.01 ng/mL (ref 0.00–0.08)

## 2017-02-27 LAB — TROPONIN I: Troponin I: <0.03 ng/mL (ref <0.03)

## 2017-02-27 MED ORDER — ONDANSETRON 4 MG PO TBDP
4.0000 mg | ORAL_TABLET | Freq: Once | ORAL | Status: AC
  Administered 2017-02-27: 4 mg via ORAL

## 2017-02-27 MED ORDER — ONDANSETRON 4 MG PO TBDP
ORAL_TABLET | ORAL | Status: AC
  Administered 2017-02-27: 4 mg via ORAL
  Filled 2017-02-27: qty 1

## 2017-02-27 MED ORDER — ACETAMINOPHEN 325 MG PO TABS
650.0000 mg | ORAL_TABLET | Freq: Once | ORAL | Status: DC
  Filled 2017-02-27: qty 2

## 2017-02-27 MED ORDER — OXYCODONE-ACETAMINOPHEN 5-325 MG PO TABS
1.0000 | ORAL_TABLET | Freq: Once | ORAL | Status: AC
  Administered 2017-02-27: 1 via ORAL
  Filled 2017-02-27: qty 1

## 2017-02-27 NOTE — ED Notes (Signed)
EDP states okay for patient to eat/drink - provided sandwich and water.

## 2017-02-27 NOTE — ED Provider Notes (Signed)
  Face-to-face evaluation   History: He is here for evaluation of persistent headache following injury.  He has been evaluated numerous times already.  He is due to see his PCP today and plans on referral to a neurologist.  Physical exam: Alert elderly man who is comfortable.  Pupils equal round reactive to light.  EOMI.  No dysarthria, or aphasia.  Medical screening examination/treatment/procedure(s) were conducted as a shared visit with non-physician practitioner(s) and myself.  I personally evaluated the patient during the encounter    Daleen Bo, MD 02/27/17 [phone]

## 2017-02-27 NOTE — ED Notes (Signed)
Patient verbalized understanding of d/c instructions and understands that if his troponin comes back abnormal, he will have to return to the ED. He is being driven to his PCP appointment at 3pm by his wife. He denies CP/SOB/dizziness. Reports his headache has improved as well. Pt ambulatory with steady gait. VSS. Escorted to ED entrance in wheelchair.

## 2017-02-27 NOTE — ED Provider Notes (Signed)
[facility] DEPT Provider Note   CSN: [id number] Arrival date & time: 02/27/17  0947   History   Chief Complaint Chief Complaint  Patient presents with  . Headache  . Chest Pain    HPI Matthew [name] is a 71 y.o. male.  HPI   71 year old male presents today with complaints of headache.  Patient notes that on August 20 he slipped and fell striking his head.  He notes since that time he has had diffuse headache, some confusion, nausea, and feeling ill.  Patient was evaluated at the [facility] with CT head, plain films of his cervical thoracic and lumbar spine with no acute abnormalities.  He again followed up at no [place] with a repeat head CT showing no acute abnormalities.  Patient has been diagnosed with postconcussive syndrome.  He notes continued headache and nausea improved with antinausea medication.  Patient denies any distal neurological deficits.  He woke up feeling ill this morning, triage note notes chest pain, patient denies chest pain wife reports that he was feeling ill and could not describe his discomfort.  Patient denies chest pain presently, denies any shortness of breath or abdominal pain.  Patient has a follow-up appointment with his primary care today at 3 PM.  Wife notes they will schedule outpatient neurology evaluation at this primary care appointment.  Since wife notes that since the fall patient has been not sleeping very well and very argumentative.   Past Medical History:  Diagnosis Date  . AAA (abdominal aortic aneurysm) ([place])    a. Aortic US 05/2012: 3.9 cm fusiform abdominal aortic aneurysm. F/u recommended 05/2014.  Marland Kitchen Anemia   . Atrial fibrillation ([place])    a. Post-op CABG 05/2009. b. Re-identified 12/2013.   Marland Kitchen CAD (coronary artery disease)    a. s/p CABGx4 05/2009. 10/10/16 PCI with DES x1 SVG-->OM2/OM3  . Chronic constipation   . CKD (chronic kidney disease), stage III   . Cocaine abuse in remission   . Colon polyps   . Diabetes mellitus   . Empyema  lung ([place])    a. PNA 02/2011 c/b empyema requiring chest tube, drainage of pleural effusion and decortication.  . Enlarged prostate   . Hepatic steatosis   . Hyperlipidemia   . Hypertension   . Hypothyroidism   . Iron deficiency anemia   . LAFB (left anterior fascicular block)   . Lung cancer ([place])    a. Tx with interleukin in 2002 per patient.  . Nodule of left lung    a. Cavitary nodules 02/2011, f/u recommended.  . Obesity   . Paroxysmal supraventricular tachycardia ([place])    a. While inpatient 02/2011 sick with empyema - wide appearace due to RBBB.  . RBBB   . Renal cell carcinoma    a. s/p R nephrectomy 2002. Reported pulmonary mets per note at that time.  . Scrotal abscess    a. 2012 2/2 Streptococcus group C, status post incision and drainage.  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  . Vitamin D deficiency     Patient Active Problem List   Diagnosis Date Noted  . Unstable angina ([place])   . NSTEMI (non-ST elevated myocardial infarction) ([place]) 10/08/2016  . Atrial fibrillation with RVR ([place]) 01/26/2016  . Atrial fibrillation with rapid ventricular response ([place]) 01/26/2016  . PAF (paroxysmal atrial fibrillation) ([place]) 12/21/2013  . Chronic abdominal pain 12/21/2013  . CKD (chronic kidney disease), stage III 12/21/2013  . H/O: lung cancer  12/21/2013  . LV dysfunction 12/21/2013  . RBBB   . LAFB (left anterior fascicular block)   . AAA (abdominal aortic aneurysm) ([place])   . Chest pain 12/20/2013  . Mixed hyperlipidemia 10/14/2013  . Obesity, unspecified 10/14/2013  . Right Empyema    .  Left upper lobe lesion or nodule   . Hypertension   . CAD (coronary artery disease)   . Renal cell carcinoma ([place])   . [place]   . Paroxysmal supraventricular tachycardia ([place])   . Hypothyroidism     Past Surgical History:  Procedure Laterality Date  . Bronchoscopy, right video-assisted thoracoscopy, minithoracotomy, drainage of pleural effusion, and  decortication.  02/26/2011   [place]  . CABG x 4  05/13/2009   [place]  . CARDIAC SURGERY    . CORONARY STENT INTERVENTION N/A 10/10/2016   Procedure: Coronary Stent Intervention;  Surgeon: Troy Sine, MD;  Location: [facility];  Service: Cardiovascular;  Laterality: N/A;  . HEMORRHOID SURGERY    . HERNIA REPAIR    . Incision, drainage and debridement of perineum and  scrotum    . Irrigation and debridement of scrotal wound and closure of scrotal wound.    Marland Kitchen LEFT HEART CATH AND CORS/GRAFTS ANGIOGRAPHY N/A 10/10/2016   Procedure: Left Heart Cath and Cors/Grafts Angiography;  Surgeon: Troy Sine, MD;  Location: [facility];  Service: Cardiovascular;  Laterality: N/A;  . Right radical nephrectomy.    . TOOTH EXTRACTION         Home Medications    Prior to Admission medications   Medication Sig Start Date End Date Taking? Authorizing Provider  albuterol (PROVENTIL HFA;VENTOLIN HFA) 108 (90 BASE) MCG/ACT inhaler Inhale 2 puffs into the lungs every 6 (six) hours as needed for wheezing or shortness of breath.     [provider]  amiodarone (PACERONE) 200 MG tablet Take 1 tablet (200 mg total) by mouth daily. Patient taking differently: Take 100 mg by mouth 2 (two) times daily.  02/21/16   Lyda Jester M, PA-C  apixaban (ELIQUIS) 5 MG TABS tablet Take 5 mg by mouth 2 (two) times daily.    [provider]  aspirin 81 MG chewable tablet Chew 1 tablet (81 mg total) by mouth daily. 10/11/16   Cheryln Manly, NP  buPROPion (WELLBUTRIN XL) 150 MG 24 hr tablet Take 150 mg by mouth every morning.    [provider]  capsaicin (ZOSTRIX) 0.025 % cream Apply 1 application topically 2 (two) times daily as needed (for pain).     [provider]  Cholecalciferol 4000 UNITS CAPS Take 4,000 Units by mouth 3 (three) times daily.    [provider]  clopidogrel (PLAVIX) 75 MG tablet Take 1 tablet (75 mg total) by mouth daily with  breakfast. 10/12/16   Reino Bellis B, NP  clotrimazole (LOTRIMIN) 1 % cream Apply 1 application topically 2 (two) times daily as needed (for rash).     [provider]  ferrous sulfate dried (SLOW FE) 160 (50 FE) MG TBCR Take 160 mg by mouth 2 (two) times daily with a meal.      [provider]  gabapentin (NEURONTIN) 300 MG capsule Take 300 mg by mouth 2 times daily at 12 noon and 4 pm.      [provider]  glucose 4 GM chewable tablet Chew 1 tablet by mouth as needed for low blood sugar.    [provider]  hydrocortisone-pramoxine [facility]) 2.5-1 %  rectal cream Place 1 application rectally 3 (three) times daily as needed for hemorrhoids or itching.     [provider]  hydrOXYzine (VISTARIL) 25 MG capsule Take 50 mg by mouth at bedtime.  10/10/14   [provider]  Insulin Detemir (LEVEMIR FLEXPEN) 100 UNIT/ML Pen Inject 60 Units into the skin 2 (two) times daily.     [provider]  insulin lispro (HUMALOG KWIKPEN) 100 UNIT/ML KiwkPen Inject 24 Units into the skin 3 (three) times daily.     [provider]  isosorbide mononitrate (IMDUR) 30 MG 24 hr tablet Take 30 mg by mouth daily.    [provider]  ketoconazole (NIZORAL) 2 % cream Apply 1 application topically daily. For rash    [provider]  levothyroxine (SYNTHROID, LEVOTHROID) 100 MCG tablet Take 100 mcg by mouth daily before breakfast.     [provider]  LIDOCAINE EX Apply 1 application topically 2 (two) times daily as needed (for pain).     [provider]  Linaclotide Rolan Lipa) 145 MCG CAPS capsule Take 145 mcg by mouth daily as needed (diarrhea).     [provider]  Magnesium Oxide 420 MG TABS Take 420 mg by mouth daily.     [provider]  metoprolol tartrate (LOPRESSOR) 25 MG tablet Take 0.5 tablets (12.5 mg total) by mouth 2 (two) times daily. 01/27/16   Cheryln Manly, NP  mupirocin ointment  (BACTROBAN) 2 % Place 1 application into the nose daily as needed (for mersa).     [provider]  nitroGLYCERIN (NITROSTAT) 0.4 MG SL tablet Place 0.4 mg under the tongue every 5 (five) minutes as needed for chest pain (x 3 doses daily).     [provider]  Omega-3 Fatty Acids (FISH OIL) 1000 MG CAPS Take 1,000 mg by mouth daily.     [provider]  oxyCODONE-acetaminophen (PERCOCET) 10-325 MG per tablet Take 1 tablet by mouth every 4 (four) hours as needed for pain.    [provider]  pantoprazole (PROTONIX) 40 MG tablet Take 1 tablet (40 mg total) by mouth 2 (two) times daily. 10/20/14   Pyrtle, Lajuan Lines, MD  polyethylene glycol powder (GLYCOLAX/MIRALAX) powder DISSOLVE 17 GRAMS (1 CAPFUL) IN AT LEAST 8 OUNCES WATER/JUICE AND DRINK DAILY. Patient taking differently: DISSOLVE 17 GRAMS (1 CAPFUL) IN AT LEAST 8 OUNCES WATER/JUICE AND DRINK DAILY AS NEEDED FOR CONSTIPATION 10/13/14   Pyrtle, Lajuan Lines, MD  rosuvastatin (CRESTOR) 40 MG tablet Take 40 mg by mouth daily.    [provider]    Family History Family History  Problem Relation Age of Onset  . Heart disease Father   . Alzheimer's disease Mother   . Pneumonia Unknown   . Colon cancer Neg Hx   . Esophageal cancer Neg Hx   . Stomach cancer Neg Hx   . Rectal cancer Neg Hx     Social History Social History  Substance Use Topics  . Smoking status: Never Smoker  . Smokeless tobacco: Never Used  . Alcohol use No     Allergies   Tetanus toxoids   Review of Systems Review of Systems  All other systems reviewed and are negative.    Physical Exam Updated Vital Signs BP 140/68   Pulse (!) 47   Temp 98 F (36.7 C) (Oral)   Resp 19   SpO2 100%   Physical Exam  Constitutional: He is oriented to person, place, and time. He appears  well-developed and well-nourished.  HENT:  Head: Normocephalic and atraumatic.  Eyes: Pupils are equal, round, and reactive to light. Conjunctivae are  normal. Right eye exhibits no discharge. Left eye exhibits no discharge. No scleral icterus.  Neck: Normal range of motion. No JVD present. No tracheal deviation present.  Cardiovascular: Regular rhythm, normal heart sounds and intact distal pulses.  Exam reveals no gallop and no friction rub.   No murmur heard. Pulmonary/Chest: Effort normal. No stridor.  Abdominal: Soft. He exhibits no distension and no mass. There is no tenderness. There is no rebound and no guarding. No hernia.  Neurological: He is alert and oriented to person, place, and time. He has normal strength. No cranial nerve deficit or sensory deficit. Coordination normal. GCS eye subscore is 4. GCS verbal subscore is 5. GCS motor subscore is 6.  Psychiatric: He has a normal mood and affect. His behavior is normal. Judgment and thought content normal.  Nursing note and vitals reviewed.   ED Treatments / Results  Labs (all labs ordered are listed, but only abnormal results are displayed) Labs Reviewed  BASIC METABOLIC PANEL - Abnormal; Notable for the following:       Result Value   Sodium 133 (*)    CO2 21 (*)    Glucose, Bld 245 (*)    Creatinine, Ser 1.84 (*)    GFR calc non Af Amer 35 (*)    GFR calc Af Amer 40 (*)    All other components within normal limits  URINALYSIS, ROUTINE W REFLEX MICROSCOPIC - Abnormal; Notable for the following:    Glucose, UA >=500 (*)    Hgb urine dipstick MODERATE (*)    All other components within normal limits  CBC  TROPONIN I  I-STAT TROPONIN, ED    EKG  EKG Interpretation None       Radiology Dg Chest 2 View  Result Date: 02/27/2017 CLINICAL DATA:  71 year old who awakened this morning with a severe headache and chest tightness. Prior CABG. EXAM: CHEST  2 VIEW COMPARISON:  10/08/2016, 02/17/2016 and earlier. FINDINGS: Prior sternotomy for CABG. Cardiac silhouette normal in size, unchanged. Coronary artery stents. Thoracic aorta mildly atherosclerotic, unchanged. Hilar and  mediastinal contours otherwise unremarkable. Minimal linear scarring in the lower lobes and lingula, unchanged. Lungs otherwise clear. No localized airspace consolidation. No pleural effusions. No pneumothorax. Normal pulmonary vascularity. Degenerative changes involving the thoracic spine. IMPRESSION: 1.  No acute cardiopulmonary disease. 2.  Aortic Atherosclerosis (ICD10-170.0) Electronically Signed   By: Evangeline Dakin M.D.   On: 02/27/2017 09:34    Procedures Procedures (including critical care time)  Medications Ordered in ED Medications  acetaminophen (TYLENOL) tablet 650 mg (650 mg Oral Not Given 02/27/17 1311)  ondansetron (ZOFRAN-ODT) disintegrating tablet 4 mg (4 mg Oral Given 02/27/17 1311)  oxyCODONE-acetaminophen (PERCOCET/ROXICET) 5-325 MG per tablet 1 tablet (1 tablet Oral Given 02/27/17 1351)     Initial Impression / Assessment and Plan / ED Course  I have reviewed the triage vital signs and the nursing notes.  Pertinent labs & imaging results that were available during my care of the patient were reviewed by me and considered in my medical decision making (see chart for details).     Final Clinical Impressions(s) / ED Diagnoses   Final diagnoses:  Post concussive syndrome  Chest pain, unspecified type    Labs: I-STAT troponin, BMP  Imaging: DG Chest 2 View  Consults:  Therapeutics: tylenol   Discharge Meds:   Assessment/Plan: Patient's presentation is  most consistent with postconcussive syndrome.  Patient has had multiple evaluations and significant workup showed no acute bleeds.  I have very low suspicion for stroke in this well-appearing patient with no neurological deficits.  Patient did not actually have chest pain today, he has 2 negative troponins here with very low suspicion for any ACS type symptoms.  Patient will require outpatient neurology follow-up, he has an appointment with his primary care today.  Patient given strict return precautions, he  verbalized understanding and agreement to today's plan had no further questions or concerns at time discharge.      New Prescriptions Discharge Medication List as of 02/27/2017  2:28 PM       Okey Regal, PA-C 02/27/17 1540    Daleen Bo, MD 02/27/17 1639    Daleen Bo, MD 02/27/17 1640

## 2017-02-27 NOTE — Discharge Instructions (Signed)
Please read attached information. If you experience any new or worsening signs or symptoms please return to the emergency room for evaluation. Please follow-up with your primary care provider or specialist as discussed.  °

## 2017-02-27 NOTE — ED Triage Notes (Signed)
Pt reports waking up this am with severe headache and chest heaviness. Feels like he has indigestion. As cardiac hx. ekg done at triage.

## 2017-03-13 ENCOUNTER — Ambulatory Visit (INDEPENDENT_AMBULATORY_CARE_PROVIDER_SITE_OTHER): Payer: 59 | Admitting: Neurology

## 2017-03-13 ENCOUNTER — Encounter: Payer: Self-pay | Admitting: Neurology

## 2017-03-13 VITALS — BP 106/67 | HR 51 | Resp 20 | Ht 72.0 in | Wt 259.0 lb

## 2017-03-13 DIAGNOSIS — G44309 Post-traumatic headache, unspecified, not intractable: Secondary | ICD-10-CM | POA: Insufficient documentation

## 2017-03-13 DIAGNOSIS — M542 Cervicalgia: Secondary | ICD-10-CM | POA: Diagnosis not present

## 2017-03-13 DIAGNOSIS — R51 Headache: Secondary | ICD-10-CM

## 2017-03-13 DIAGNOSIS — G4700 Insomnia, unspecified: Secondary | ICD-10-CM

## 2017-03-13 DIAGNOSIS — S0990XS Unspecified injury of head, sequela: Secondary | ICD-10-CM | POA: Diagnosis not present

## 2017-03-13 DIAGNOSIS — F0781 Postconcussional syndrome: Secondary | ICD-10-CM | POA: Diagnosis not present

## 2017-03-13 DIAGNOSIS — E1142 Type 2 diabetes mellitus with diabetic polyneuropathy: Secondary | ICD-10-CM | POA: Diagnosis not present

## 2017-03-13 MED ORDER — NORTRIPTYLINE HCL 25 MG PO CAPS: 25.0000 mg | ORAL_CAPSULE | Freq: Every day | ORAL | 3 refills | Status: DC

## 2017-03-13 NOTE — Patient Instructions (Signed)
Take nortriptyline (25 mg) one by mouth nightly.  Stop the trazodone. However, if you need to take it for sleep just take one half pill.

## 2017-03-13 NOTE — Progress Notes (Signed)
[facility]  PATIENT: Matthew [name] DOB: 1946-05-19  REFERRING DOCTOR OR PCP:  Lennette Bihari Via SOURCE: Patient, notes from Dr. Lynelle Doctor, emergency room notes, CT scan reports.  _________________________________   HISTORICAL  CHIEF COMPLAINT:  Chief Complaint  Patient presents with  . Headache    [name] is here with his wife [name] for eval of persistent h/a.  Sts. on 01/28/17 he fell and hit head on concrete.  No LOC.  Seen by the [facility] in [place], had one CT head there, and a 2nd CT head at [facility] in [place]. Also seen 02/27/17 at [facility] ER. No imaging done there.  Recent MI (October 07, 2016) with one stent placed./fim    HISTORY OF PRESENT ILLNESS:  I had the pleasure seeing you patient, [name], [facility] for neurologic consultation regarding his head injury and persistent Headaches.  On 01/28/2017, he fell on a slippery floor and landed on his back and the back of the head.   There was no LOC.   Later that night, he  Began to notice a headache and has had one since.    The headache is bi-occipital and radiates to the forehead.    Sometimes, the pain is all over.  Pain is a constant pressure sensation.    He wakes up with pain and goes to bed with a headache.    He takes oxycodone for his back (4/day) but it has not helped the headache much.   Nothing seems to increase or decrease the intensity much.    Vision is blurry at times.     He has had nausea and near-vomiting but nothing has come up.   He has been prescribed Zofran.     He is more indecisive and doesn't want to be alone since the incident and seems cognitively more slowed according to his wife.    He reports being more tired.         He first went to the [facility] [place] 01/29/2017 and had CT of the head (no bleed) and xrays of the cervical spine (no fractures).   He woke up one morning in intense pain and felt ;like he was dying'.   He was pacing and couldn't stay still.    His son  took him to the ED 02/27/2017.   He went to [facility] ED 02/22/17 [name]) and  02/27/2017.     Besides the headache, back pain has been worse since his fall.   He sees pain management and is on oxycodone.  He has diabetes mellitus.  He has been told he has neuropathy from the diabetes.Marland Kitchen He also has a history of an MI April 29 and had a stent placed. He has had arrhythmias and is on amiodarone.  A CT scan report ([facility]) 02/22/2017 reports cortical and cerebellar atrophy, bilateral cerebellar lacunar infarctions and chronic microvessel ischemic changes. There was no report of any bleed.  REVIEW OF SYSTEMS: Constitutional: No fevers, chills, sweats, or change in appetite Eyes: No visual changes, double vision, eye pain Ear, nose and throat: No hearing loss, ear pain, nasal congestion, sore throat Cardiovascular: Has history of MI and A. fib. Respiratory: No shortness of breath at rest or with exertion.   No wheezes.   He has OSA ans has CPAP GastrointestinaI: No nausea, vomiting, diarrhea, abdominal pain, fecal incontinence Genitourinary: No dysuria, urinary retention or frequency.  No nocturia. Musculoskeletal: He reports neck and back pain, see above Integumentary: No rash, pruritus, skin  lesions Neurological: as above Psychiatric: No depression at this time.  No anxiety Endocrine: No palpitations, diaphoresis, change in appetite, change in weigh or increased thirst Hematologic/Lymphatic: No anemia, purpura, petechiae. Allergic/Immunologic: No itchy/runny eyes, nasal congestion, recent allergic reactions, rashes  ALLERGIES: Allergies  Allergen Reactions  . Tetanus Toxoids Hives    HOME MEDICATIONS:  Current Outpatient Prescriptions:  .  albuterol (PROVENTIL HFA;VENTOLIN HFA) 108 (90 BASE) MCG/ACT inhaler, Inhale 2 puffs into the lungs every 6 (six) hours as needed for wheezing or shortness of breath. , Disp: , Rfl:  .  amiodarone (PACERONE) 200 MG tablet, Take 1 tablet  (200 mg total) by mouth daily. (Patient taking differently: Take 100 mg by mouth 2 (two) times daily. ), Disp: 60 tablet, Rfl: 11 .  apixaban (ELIQUIS) 5 MG TABS tablet, Take 5 mg by mouth 2 (two) times daily., Disp: , Rfl:  .  buPROPion (WELLBUTRIN XL) 150 MG 24 hr tablet, Take 150 mg by mouth every morning., Disp: , Rfl:  .  capsaicin (ZOSTRIX) 0.025 % cream, Apply 1 application topically 2 (two) times daily as needed (for pain). , Disp: , Rfl:  .  Cholecalciferol 4000 UNITS CAPS, Take 4,000 Units by mouth 3 (three) times daily., Disp: , Rfl:  .  clopidogrel (PLAVIX) 75 MG tablet, Take 1 tablet (75 mg total) by mouth daily with breakfast., Disp: 30 tablet, Rfl: 11 .  clotrimazole (LOTRIMIN) 1 % cream, Apply 1 application topically 2 (two) times daily as needed (for rash). , Disp: , Rfl:  .  ferrous sulfate dried (SLOW FE) 160 (50 FE) MG TBCR, Take 160 mg by mouth 2 (two) times daily with a meal.  , Disp: , Rfl:  .  gabapentin (NEURONTIN) 300 MG capsule, Take 300 mg by mouth 2 times daily at 12 noon and 4 pm.  , Disp: , Rfl:  .  glucose 4 GM chewable tablet, Chew 1 tablet by mouth as needed for low blood sugar., Disp: , Rfl:  .  hydrocortisone-pramoxine (ANALPRAM-HC) 2.5-1 % rectal cream, Place 1 application rectally 3 (three) times daily as needed for hemorrhoids or itching. , Disp: , Rfl:  .  Insulin Detemir (LEVEMIR FLEXPEN) 100 UNIT/ML Pen, Inject 60 Units into the skin 2 (two) times daily. , Disp: , Rfl:  .  insulin lispro (HUMALOG KWIKPEN) 100 UNIT/ML KiwkPen, Inject 24 Units into the skin 3 (three) times daily. , Disp: , Rfl:  .  isosorbide mononitrate (IMDUR) 30 MG 24 hr tablet, Take 30 mg by mouth daily., Disp: , Rfl:  .  ketoconazole (NIZORAL) 2 % cream, Apply 1 application topically daily. For rash, Disp: , Rfl:  .  levothyroxine (SYNTHROID, LEVOTHROID) 100 MCG tablet, Take 100 mcg by mouth daily before breakfast. , Disp: , Rfl:  .  LIDOCAINE EX, Apply 1 application topically 2 (two)  times daily as needed (for pain). , Disp: , Rfl:  .  Linaclotide (LINZESS) 145 MCG CAPS capsule, Take 145 mcg by mouth daily as needed (diarrhea). , Disp: , Rfl:  .  Magnesium Oxide 420 MG TABS, Take 420 mg by mouth daily. , Disp: , Rfl:  .  mesalamine (CANASA) 1000 MG suppository, Place rectally., Disp: , Rfl:  .  mesalamine (PENTASA) 250 MG CR capsule, Take by mouth., Disp: , Rfl:  .  methylPREDNISolone (MEDROL) 4 MG tablet, Take 4 mg by mouth daily., Disp: , Rfl:  .  metoprolol tartrate (LOPRESSOR) 25 MG tablet, Take 0.5 tablets (12.5 mg total) by mouth 2 (  two) times daily., Disp: 60 tablet, Rfl: 6 .  mupirocin ointment (BACTROBAN) 2 %, Place 1 application into the nose daily as needed (for mersa). , Disp: , Rfl:  .  nitroGLYCERIN (NITROSTAT) 0.4 MG SL tablet, Place 0.4 mg under the tongue every 5 (five) minutes as needed for chest pain (x 3 doses daily). , Disp: , Rfl:  .  Omega-3 Fatty Acids (FISH OIL) 1000 MG CAPS, Take 1,000 mg by mouth daily. , Disp: , Rfl:  .  ondansetron (ZOFRAN-ODT) 8 MG disintegrating tablet, Take 8 mg by mouth every 8 (eight) hours as needed for nausea or vomiting., Disp: , Rfl:  .  oxyCODONE-acetaminophen (PERCOCET) 10-325 MG per tablet, Take 1 tablet by mouth every 4 (four) hours as needed for pain., Disp: , Rfl:  .  RABEprazole (ACIPHEX) 20 MG tablet, Take by mouth., Disp: , Rfl:  .  rosuvastatin (CRESTOR) 40 MG tablet, Take 40 mg by mouth daily., Disp: , Rfl:  .  silver sulfADIAZINE (SILVADENE) 1 % cream, Apply 1 application topically daily., Disp: , Rfl:  .  terbinafine (LAMISIL) 1 % cream, Apply 1 application topically 2 (two) times daily., Disp: , Rfl:  .  traZODone (DESYREL) 50 MG tablet, Take 50 mg by mouth at bedtime., Disp: , Rfl:  .  urea 10 % lotion, Apply 1 application topically as needed for dry skin., Disp: , Rfl:  .  hydrOXYzine (VISTARIL) 25 MG capsule, Take 50 mg by mouth at bedtime. , Disp: , Rfl: 1 .  nortriptyline (PAMELOR) 25 MG capsule, Take 1  capsule (25 mg total) by mouth at bedtime., Disp: 30 capsule, Rfl: 3  PAST MEDICAL HISTORY: Past Medical History:  Diagnosis Date  . AAA (abdominal aortic aneurysm) ([place])    a. Aortic US 05/2012: 3.9 cm fusiform abdominal aortic aneurysm. F/u recommended 05/2014.  Marland Kitchen Anemia   . Atrial fibrillation ([place])    a. Post-op CABG 05/2009. b. Re-identified 12/2013.   Marland Kitchen CAD (coronary artery disease)    a. s/p CABGx4 05/2009. 10/10/16 PCI with DES x1 SVG-->OM2/OM3  . Chronic constipation   . CKD (chronic kidney disease), stage III ([place])   . Cocaine abuse in remission ([place])   . Colon polyps   . Diabetes mellitus   . Empyema lung ([place])    a. PNA 02/2011 c/b empyema requiring chest tube, drainage of pleural effusion and decortication.  . Enlarged prostate   . Hepatic steatosis   . Hyperlipidemia   . Hypertension   . Hypothyroidism   . Iron deficiency anemia   . LAFB (left anterior fascicular block)   . Lung cancer ([place])    a. Tx with interleukin in 2002 per patient.  . Nodule of left lung    a. Cavitary nodules 02/2011, f/u recommended.  . Obesity   . Paroxysmal supraventricular tachycardia ([place])    a. While inpatient 02/2011 sick with empyema - wide appearace due to RBBB.  . RBBB   . Renal cell carcinoma    a. s/p R nephrectomy 2002. Reported pulmonary mets per note at that time.  . Scrotal abscess    a. 2012 2/2 Streptococcus group C, status post incision and drainage.  Marland Kitchen Ulcerative proctitis ([place])    a. Remote dx dating back to 1979.  . Vitamin D deficiency     PAST SURGICAL HISTORY: Past Surgical History:  Procedure Laterality Date  . Bronchoscopy, right video-assisted thoracoscopy, minithoracotomy, drainage of pleural effusion, and decortication.  02/26/2011   [place]  . CABG  x 4  05/13/2009   [place]  . CARDIAC SURGERY    . CORONARY STENT INTERVENTION N/A 10/10/2016   Procedure: Coronary Stent Intervention;  Surgeon: Troy Sine, MD;  Location: [facility];  Service:  Cardiovascular;  Laterality: N/A;  . HEMORRHOID SURGERY    . HERNIA REPAIR    . Incision, drainage and debridement of perineum and  scrotum    . Irrigation and debridement of scrotal wound and closure of scrotal wound.    Marland Kitchen LEFT HEART CATH AND CORS/GRAFTS ANGIOGRAPHY N/A 10/10/2016   Procedure: Left Heart Cath and Cors/Grafts Angiography;  Surgeon: Troy Sine, MD;  Location: [facility];  Service: Cardiovascular;  Laterality: N/A;  . Right radical nephrectomy.    . TOOTH EXTRACTION      FAMILY HISTORY: Family History  Problem Relation Age of Onset  . Heart disease Father   . Alzheimer's disease Mother   . Pneumonia Unknown   . Colon cancer Neg Hx   . Esophageal cancer Neg Hx   . Stomach cancer Neg Hx   . Rectal cancer Neg Hx     SOCIAL HISTORY:  Social History   Social History  . Marital status: Married    Spouse name: N/A  . Number of children: 4  . Years of education: N/A   Occupational History  . 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 Smoking status: Never Smoker  . Smokeless tobacco: Never Used  . Alcohol use No  . Drug use: Yes    Types: Cocaine     Comment: stopped cocaine 2 and 1/2 years ago  . Sexual activity: Not on file   Other Topics Concern  . Not on file   Social History Narrative  . No narrative on file     PHYSICAL EXAM  Vitals:   03/13/17 1031  BP: 106/67  Pulse: (!) 51  Resp: 20  Weight: 259 lb (117.5 kg)  Height: 6' (1.829 m)    Body mass index is 35.13 kg/m.   General: The patient is well-developed and well-nourished and in no acute distress.    He seemed slightly anxious or agitated.  Eyes:  Funduscopic exam shows normal optic discs and retinal vessels.  Neck: The neck is supple, no carotid bruits are noted.  The neck is tender at the occiput, right much more so than left. Range of Motion was slightly reduced in the neck..  Cardiovascular: The heart has a regular rate and rhythm with a normal S1  and S2. There were no murmurs, gallops or rubs.    Skin: Extremities are without significant edema.  Musculoskeletal:  Back is nontender  Neurologic Exam  Mental status: The patient is alert and oriented x 3 at the time of the examination. The patient has apparent normal recent and remote memory, with an apparently normal attention span and concentration ability.   Speech is normal.  Cranial nerves: Extraocular movements are full. Pupils are equal, round, and reactive to light and accomodation.  Visual fields are full.  Facial symmetry is present. There is good facial sensation to soft touch bilaterally.Facial strength is normal.  Trapezius and sternocleidomastoid strength is normal. No dysarthria is noted.  The tongue is midline, and the patient has symmetric elevation of the soft palate. No obvious hearing deficits are noted.  Motor:  Muscle bulk is normal.   Tone is normal. Strength is  5 / 5 in all 4 extremities.   Sensory:  Sensory testing is intact to pinprick, soft touch and vibration sensation in arms.   Reduced vibration at ankles and near absent at toes.  Coordination: Cerebellar testing reveals good finger-nose-finger and heel-to-shin bilaterally.  Gait and station: Station is normal.   Gait is arthrtic. Tandem gait is difficult. Romberg is negative.   Reflexes: Deep tendon reflexes are symmetric and normal bilaterally in arms, absent at the ankles.    DIAGNOSTIC DATA (LABS, IMAGING, TESTING) - I reviewed patient records, labs, notes, testing and imaging myself where available.  Lab Results  Component Value Date   WBC 6.4 02/27/2017   HGB 13.8 02/27/2017   HCT 41.6 02/27/2017   MCV 92.7 02/27/2017   PLT 196 02/27/2017      Component Value Date/Time   NA 133 (L) 02/27/2017 0909   K 4.8 02/27/2017 0909   CL 102 02/27/2017 0909   CO2 21 (L) 02/27/2017 0909   GLUCOSE 245 (H) 02/27/2017 0909   BUN 20 02/27/2017 0909   CREATININE 1.84 (H) 02/27/2017 0909   CALCIUM 9.4  02/27/2017 0909   PROT 7.0 10/09/2016 0506   ALBUMIN 3.5 10/09/2016 0506   AST 27 10/09/2016 0506   ALT 21 10/09/2016 0506   ALT 79 (H) 10/20/2014 1225   ALKPHOS 27 (L) 10/09/2016 0506   BILITOT 0.7 10/09/2016 0506   GFRNONAA 35 (L) 02/27/2017 0909   GFRAA 40 (L) 02/27/2017 0909   Lab Results  Component Value Date   CHOL 194 10/09/2016   HDL 28 (L) 10/09/2016   LDLCALC 113 (H) 10/09/2016   TRIG 265 (H) 10/09/2016   CHOLHDL 6.9 10/09/2016   Lab Results  Component Value Date   HGBA1C 7.0 (H) 10/09/2016   No results found for: VITAMINB12 Lab Results  Component Value Date   TSH 2.090 10/08/2016       ASSESSMENT AND PLAN  Injury of head, sequela - Plan: MR BRAIN WO CONTRAST  Post concussion syndrome  Post-concussion headache  Neck pain  Insomnia, unspecified type  Diabetic polyneuropathy associated with type 2 diabetes mellitus ([place])    In summary, Mr. Gaster is a 71 year old man with a postconcussive syndrome manifested by headache and mild cognitive changes. Because of the behavioral changes, we need to check an MRI of the brain to make sure that there is not an alternative explanation that would require other medical treatment or referral (i.e. Stroke or hemorrhage).    To help with headache, a splenius capitis trigger point injection was performed bilaterally with 40 mg Depo-Medrol in [place]. He tolerated the procedure well and pain was a little better afterwards. Postconcussive headaches often improve with tricyclics. He has a history of A. fib and is on amiodarone. I will add a low-dose of nortriptyline but would be hesitant to increase the dose due to possible drug interaction. Additionally, I asked him to stop the trazodone or cut it in half or some overlap. Generally, however, nortriptyline is more useful for pain than trazodone.  I will see him back for follow-up in 4 weeks and we will call after the MRI is performed with the results and set up further  intervention needed. He should call us back sooner if there are any new or worsening neurologic symptoms.  Thank you for asking [place] to see Mr. Aquilino. Please let me know if I can be of further assistance with her or other patients in the future.   Tekila Caillouet A. Felecia Shelling, MD, [facility] 29/10/2839, 32:44 PM Certified in Neurology, Clinical Neurophysiology, Sleep Medicine,  Pain Medicine and Neuroimaging  [facility] [address] [place], [place] [address] [phone]

## 2017-03-18 ENCOUNTER — Other Ambulatory Visit: Payer: Self-pay | Admitting: Family Medicine

## 2017-03-18 ENCOUNTER — Ambulatory Visit
Admission: RE | Admit: 2017-03-18 | Discharge: 2017-03-18 | Disposition: A | Discharge status: Home / Self Care | Payer: 59 | Source: Ambulatory Visit | Attending: Family Medicine | Admitting: Family Medicine

## 2017-03-18 DIAGNOSIS — W19XXXA Unspecified fall, initial encounter: Secondary | ICD-10-CM

## 2017-03-27 ENCOUNTER — Ambulatory Visit
Admission: RE | Admit: 2017-03-27 | Discharge: 2017-03-27 | Disposition: A | Discharge status: Home / Self Care | Payer: Medicare Other | Source: Ambulatory Visit | Attending: Neurology | Admitting: Neurology

## 2017-03-27 DIAGNOSIS — S0990XS Unspecified injury of head, sequela: Secondary | ICD-10-CM

## 2017-04-10 ENCOUNTER — Encounter: Payer: Self-pay | Admitting: Neurology

## 2017-04-10 ENCOUNTER — Ambulatory Visit (INDEPENDENT_AMBULATORY_CARE_PROVIDER_SITE_OTHER): Payer: 59 | Admitting: Neurology

## 2017-04-10 VITALS — BP 115/70 | HR 71 | Resp 18 | Ht 72.0 in | Wt 260.5 lb

## 2017-04-10 DIAGNOSIS — G8929 Other chronic pain: Secondary | ICD-10-CM

## 2017-04-10 DIAGNOSIS — R51 Headache: Secondary | ICD-10-CM | POA: Diagnosis not present

## 2017-04-10 DIAGNOSIS — G4700 Insomnia, unspecified: Secondary | ICD-10-CM

## 2017-04-10 DIAGNOSIS — G44309 Post-traumatic headache, unspecified, not intractable: Secondary | ICD-10-CM

## 2017-04-10 DIAGNOSIS — M542 Cervicalgia: Secondary | ICD-10-CM | POA: Diagnosis not present

## 2017-04-10 DIAGNOSIS — F0781 Postconcussional syndrome: Secondary | ICD-10-CM

## 2017-04-10 DIAGNOSIS — E1142 Type 2 diabetes mellitus with diabetic polyneuropathy: Secondary | ICD-10-CM

## 2017-04-10 DIAGNOSIS — I200 Unstable angina: Secondary | ICD-10-CM

## 2017-04-10 MED ORDER — TEMAZEPAM 7.5 MG PO CAPS: 7.5000 mg | ORAL_CAPSULE | Freq: Every evening | ORAL | 0 refills | Status: DC | PRN

## 2017-04-10 MED ORDER — TEMAZEPAM 7.5 MG PO CAPS: 7.5000 mg | ORAL_CAPSULE | Freq: Every evening | ORAL | 3 refills | Status: DC | PRN

## 2017-04-10 MED ORDER — NORTRIPTYLINE HCL 25 MG PO CAPS: 25.0000 mg | ORAL_CAPSULE | Freq: Every day | ORAL | 5 refills | Status: DC

## 2017-04-10 NOTE — Progress Notes (Addendum)
[facility]  PATIENT: Matthew [name] DOB: 1946/06/07  REFERRING DOCTOR OR PCP:  Lennette Bihari Via SOURCE: Patient, notes from Dr. Lynelle Doctor, emergency room notes, CT scan reports.  _________________________________   HISTORICAL  CHIEF COMPLAINT:  Chief Complaint  Patient presents with  . Postconcussive Syndrome    He started Nortriptyline and decreased Trazodone to 1/4 tab couple of times a wk. Feels h/a's are 10% better but sleep is worse.  Has a h/a today and would like tpi if appropriate.  Not able to complete MRI brain due to nausea/claustrophobia.  [place] again on 03/18/17 in his bathroom at home.  Became weak and dizzy and hit left frontal region on ? sink.  CT done same day was ok./fim    HISTORY OF PRESENT ILLNESS:  Matthew [name] Is a 71 year old man with postconcussive syndrome.  Update 04/10/2017:     The TPI last visit helped a lot for a few days   Since last visit, nortriptyline 10 mg was added. He feels the headaches are minimally better (10%). However, we also reduce the trazodone and he feels his sleep is doing worse.   His poor balance is better.     He sleeps more than he used to He feels unmotivated to get out of bed.      He was unable to complete the MRI due to nausea and claustrophobia. He had another fall while urinating (he needs to strain due to a large prostate) on 03/18/2017 when he became weak and dizzy and he hit the left frontal region on the sink. The CT scan 03/19/2027 was personally reviewed. It did not show any acute intracranial abnormalities. 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      He is on chronic oxycodone 10/325 qid and has constipation  From 03/13/2017: On 01/28/2017, he fell on a slippery floor and landed on his back and the back of the head.   There was no LOC.   Later that night, he  Began to notice a headache and has  had one since.    The headache is bi-occipital and radiates to the forehead.    Sometimes, the pain is all over.  Pain is a constant pressure sensation.    He wakes up with pain and goes to bed with a headache.    He takes oxycodone for his back (4/day) but it has not helped the headache much.   Nothing seems to increase or decrease the intensity much.    Vision is blurry at times.     He has had nausea and near-vomiting but nothing has come up.   He has been prescribed Zofran.     He is more indecisive and doesn't want to be alone since the incident and seems cognitively more slowed according to his wife.    He reports being more tired.         He first went to the [place] 01/29/2017 and had CT of the head (no bleed) and xrays of the cervical spine (no fractures).   He woke up one morning in intense pain and felt ;like he was dying'.   He was pacing and couldn't stay still.    His son took him to the ED 02/27/2017.   He went to [facility] ED 02/22/17 [name]) and [place] 02/27/2017.     Besides the headache, back pain has been worse since  his fall.   He sees pain management and is on oxycodone.  He has diabetes mellitus.  He has been told he has neuropathy from the diabetes.Marland Kitchen He also has a history of an MI April 29 and had a stent placed. He has had arrhythmias and is on amiodarone.  A CT scan report ([facility]) 02/22/2017 reports cortical and cerebellar atrophy, bilateral cerebellar lacunar infarctions and chronic microvessel ischemic changes. There was no report of any bleed.  REVIEW OF SYSTEMS: Constitutional: No fevers, chills, sweats, or change in appetite Eyes: No visual changes, double vision, eye pain Ear, nose and throat: No hearing loss, ear pain, nasal congestion, sore throat Cardiovascular: Has history of MI and A. fib. Respiratory: No shortness of breath at rest or with exertion.   No wheezes.   He has OSA ans has CPAP GastrointestinaI: No nausea, vomiting, diarrhea, abdominal pain, fecal  incontinence Genitourinary: No dysuria, urinary retention or frequency.  No nocturia. Musculoskeletal: He reports neck and back pain, see above Integumentary: No rash, pruritus, skin lesions Neurological: as above Psychiatric: No depression at this time.  No anxiety Endocrine: No palpitations, diaphoresis, change in appetite, change in weigh or increased thirst Hematologic/Lymphatic: No anemia, purpura, petechiae. Allergic/Immunologic: No itchy/runny eyes, nasal congestion, recent allergic reactions, rashes  ALLERGIES: Allergies  Allergen Reactions  . Tetanus Toxoids Hives    HOME MEDICATIONS:  Current Outpatient Prescriptions:  .  albuterol (PROVENTIL HFA;VENTOLIN HFA) 108 (90 BASE) MCG/ACT inhaler, Inhale 2 puffs into the lungs every 6 (six) hours as needed for wheezing or shortness of breath. , Disp: , Rfl:  .  amiodarone (PACERONE) 200 MG tablet, Take 1 tablet (200 mg total) by mouth daily. (Patient taking differently: Take 100 mg by mouth 2 (two) times daily. ), Disp: 60 tablet, Rfl: 11 .  apixaban (ELIQUIS) 5 MG TABS tablet, Take 5 mg by mouth 2 (two) times daily., Disp: , Rfl:  .  buPROPion (WELLBUTRIN XL) 150 MG 24 hr tablet, Take 150 mg by mouth every morning., Disp: , Rfl:  .  capsaicin (ZOSTRIX) 0.025 % cream, Apply 1 application topically 2 (two) times daily as needed (for pain). , Disp: , Rfl:  .  Cholecalciferol 4000 UNITS CAPS, Take 4,000 Units by mouth 3 (three) times daily., Disp: , Rfl:  .  clopidogrel (PLAVIX) 75 MG tablet, Take 1 tablet (75 mg total) by mouth daily with breakfast., Disp: 30 tablet, Rfl: 11 .  clotrimazole (LOTRIMIN) 1 % cream, Apply 1 application topically 2 (two) times daily as needed (for rash). , Disp: , Rfl:  .  ferrous sulfate dried (SLOW FE) 160 (50 FE) MG TBCR, Take 160 mg by mouth 2 (two) times daily with a meal.  , Disp: , Rfl:  .  gabapentin (NEURONTIN) 300 MG capsule, Take 300 mg by mouth 2 times daily at 12 noon and 4 pm.  , Disp: , Rfl:   .  glucose 4 GM chewable tablet, Chew 1 tablet by mouth as needed for low blood sugar., Disp: , Rfl:  .  hydrocortisone-pramoxine (ANALPRAM-HC) 2.5-1 % rectal cream, Place 1 application rectally 3 (three) times daily as needed for hemorrhoids or itching. , Disp: , Rfl:  .  hydrOXYzine (VISTARIL) 25 MG capsule, Take 50 mg by mouth at bedtime. , Disp: , Rfl: 1 .  Insulin Detemir (LEVEMIR FLEXPEN) 100 UNIT/ML Pen, Inject 60 Units into the skin 2 (two) times daily. , Disp: , Rfl:  .  insulin lispro (HUMALOG KWIKPEN) 100 UNIT/ML KiwkPen, Inject 24  Units into the skin 3 (three) times daily. , Disp: , Rfl:  .  isosorbide mononitrate (IMDUR) 30 MG 24 hr tablet, Take 30 mg by mouth daily., Disp: , Rfl:  .  ketoconazole (NIZORAL) 2 % cream, Apply 1 application topically daily. For rash, Disp: , Rfl:  .  levothyroxine (SYNTHROID, LEVOTHROID) 100 MCG tablet, Take 100 mcg by mouth daily before breakfast. , Disp: , Rfl:  .  LIDOCAINE EX, Apply 1 application topically 2 (two) times daily as needed (for pain). , Disp: , Rfl:  .  Linaclotide (LINZESS) 145 MCG CAPS capsule, Take 145 mcg by mouth daily as needed (diarrhea). , Disp: , Rfl:  .  Magnesium Oxide 420 MG TABS, Take 420 mg by mouth daily. , Disp: , Rfl:  .  mesalamine (CANASA) 1000 MG suppository, Place rectally., Disp: , Rfl:  .  mesalamine (PENTASA) 250 MG CR capsule, Take by mouth., Disp: , Rfl:  .  methylPREDNISolone (MEDROL) 4 MG tablet, Take 4 mg by mouth daily., Disp: , Rfl:  .  metoprolol tartrate (LOPRESSOR) 25 MG tablet, Take 0.5 tablets (12.5 mg total) by mouth 2 (two) times daily., Disp: 60 tablet, Rfl: 6 .  mupirocin ointment (BACTROBAN) 2 %, Place 1 application into the nose daily as needed (for mersa). , Disp: , Rfl:  .  nitroGLYCERIN (NITROSTAT) 0.4 MG SL tablet, Place 0.4 mg under the tongue every 5 (five) minutes as needed for chest pain (x 3 doses daily). , Disp: , Rfl:  .  nortriptyline (PAMELOR) 25 MG capsule, Take 1 capsule (25 mg  total) by mouth at bedtime., Disp: 30 capsule, Rfl: 5 .  Omega-3 Fatty Acids (FISH OIL) 1000 MG CAPS, Take 1,000 mg by mouth daily. , Disp: , Rfl:  .  ondansetron (ZOFRAN-ODT) 8 MG disintegrating tablet, Take 8 mg by mouth every 8 (eight) hours as needed for nausea or vomiting., Disp: , Rfl:  .  oxyCODONE-acetaminophen (PERCOCET) 10-325 MG per tablet, Take 1 tablet by mouth every 4 (four) hours as needed for pain., Disp: , Rfl:  .  RABEprazole (ACIPHEX) 20 MG tablet, Take by mouth., Disp: , Rfl:  .  rosuvastatin (CRESTOR) 40 MG tablet, Take 40 mg by mouth daily., Disp: , Rfl:  .  silver sulfADIAZINE (SILVADENE) 1 % cream, Apply 1 application topically daily., Disp: , Rfl:  .  terbinafine (LAMISIL) 1 % cream, Apply 1 application topically 2 (two) times daily., Disp: , Rfl:  .  traZODone (DESYREL) 50 MG tablet, Take 50 mg by mouth at bedtime., Disp: , Rfl:  .  urea 10 % lotion, Apply 1 application topically as needed for dry skin., Disp: , Rfl:  .  temazepam (RESTORIL) 7.5 MG capsule, Take 1 capsule (7.5 mg total) by mouth at bedtime as needed for sleep., Disp: 30 capsule, Rfl: 3  PAST MEDICAL HISTORY: Past Medical History:  Diagnosis Date  . AAA (abdominal aortic aneurysm) ([place])    a. Aortic US 05/2012: 3.9 cm fusiform abdominal aortic aneurysm. F/u recommended 05/2014.  Marland Kitchen Anemia   . Atrial fibrillation ([place])    a. Post-op CABG 05/2009. b. Re-identified 12/2013.   Marland Kitchen CAD (coronary artery disease)    a. s/p CABGx4 05/2009. 10/10/16 PCI with DES x1 SVG-->OM2/OM3  . Chronic constipation   . CKD (chronic kidney disease), stage III ([place])   . Cocaine abuse in remission ([place])   . Colon polyps   . Diabetes mellitus   . Empyema lung ([place])    a. PNA 02/2011  c/b empyema requiring chest tube, drainage of pleural effusion and decortication.  . Enlarged prostate   . Hepatic steatosis   . Hyperlipidemia   . Hypertension   . Hypothyroidism   . Iron deficiency anemia   . LAFB (left anterior fascicular  block)   . Lung cancer ([place])    a. Tx with interleukin in 2002 per patient.  . Nodule of left lung    a. Cavitary nodules 02/2011, f/u recommended.  . Obesity   . Paroxysmal supraventricular tachycardia ([place])    a. While inpatient 02/2011 sick with empyema - wide appearace due to RBBB.  . RBBB   . Renal cell carcinoma    a. s/p R nephrectomy 2002. Reported pulmonary mets per note at that time.  . Scrotal abscess    a. 2012 2/2 Streptococcus group C, status post incision and drainage.  Marland Kitchen Ulcerative proctitis ([place])    a. Remote dx dating back to 1979.  . Vitamin D deficiency     PAST SURGICAL HISTORY: Past Surgical History:  Procedure Laterality Date  . Bronchoscopy, right video-assisted thoracoscopy, minithoracotomy, drainage of pleural effusion, and decortication.  02/26/2011   [place]  . CABG x 4  05/13/2009   [place]  . CARDIAC SURGERY    . CORONARY STENT INTERVENTION N/A 10/10/2016   Procedure: Coronary Stent Intervention;  Surgeon: Troy Sine, MD;  Location: [place] [facility];  Service: Cardiovascular;  Laterality: N/A;  . HEMORRHOID SURGERY    . HERNIA REPAIR    . Incision, drainage and debridement of perineum and  scrotum    . Irrigation and debridement of scrotal wound and closure of scrotal wound.    Marland Kitchen LEFT HEART CATH AND CORS/GRAFTS ANGIOGRAPHY N/A 10/10/2016   Procedure: Left Heart Cath and Cors/Grafts Angiography;  Surgeon: Troy Sine, MD;  Location: [facility];  Service: Cardiovascular;  Laterality: N/A;  . Right radical nephrectomy.    . TOOTH EXTRACTION      FAMILY HISTORY: Family History  Problem Relation Age of Onset  . Heart disease Father   . Alzheimer's disease Mother   . Pneumonia Unknown   . Colon cancer Neg Hx   . Esophageal cancer Neg Hx   . Stomach cancer Neg Hx   . Rectal cancer Neg Hx     SOCIAL HISTORY:  Social History   Social History  . Marital status: Married    Spouse name: N/A  . Number of children: 4  . Years of  education: N/A   Occupational History  . 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 Smoking status: Never Smoker  . Smokeless tobacco: Never Used  . Alcohol use No  . Drug use: Yes    Types: Cocaine     Comment: stopped cocaine 2 and 1/2 years ago  . Sexual activity: Not on file   Other Topics Concern  . Not on file   Social History Narrative  . No narrative on file     PHYSICAL EXAM  Vitals:   04/10/17 0859  BP: 115/70  Pulse: 71  Resp: 18  Weight: 260 lb 8 oz (118.2 kg)  Height: 6' (1.829 m)    Body mass index is 35.33 kg/m.   General: The patient is well-developed and well-nourished and in no acute distress.    He seemed slightly anxious or agitated.   Neurologic Exam  Mental status: The patient is alert and oriented x 3 at the time of the  examination. The patient has apparent normal recent and remote memory, with an apparently normal attention span and concentration ability.   Speech is normal.  Cranial nerves: Extraocular movements are full. The pupils react to light and accommodation. Facial strength and sensation is normal. Trapezius strength is normal. The tongue is midline, and the patient has symmetric elevation of the soft palate. No obvious hearing deficits are noted.  Motor:  Muscle bulk is normal.   Tone is normal. Strength is  5 / 5 in all 4 extremities.   Sensory: Sensory testing is intact to pinprick, soft touch and vibration sensation in arms.   Reduced vibration at ankles and near absent at toes.  Coordination: Cerebellar testing shows good finger-nose-finger and heel-to-shin..  Gait and station: Station is normal.   Gait is arthrtic. The tandem gait is wide. Romberg is negative..   Reflexes: Deep tendon reflexes are symmetric and normal bilaterally in arms, absent at the ankles.    DIAGNOSTIC DATA (LABS, IMAGING, TESTING) - I reviewed patient records, labs, notes, testing and imaging myself where available.  Lab Results    Component Value Date   WBC 6.4 02/27/2017   HGB 13.8 02/27/2017   HCT 41.6 02/27/2017   MCV 92.7 02/27/2017   PLT 196 02/27/2017      Component Value Date/Time   NA 133 (L) 02/27/2017 0909   K 4.8 02/27/2017 0909   CL 102 02/27/2017 0909   CO2 21 (L) 02/27/2017 0909   GLUCOSE 245 (H) 02/27/2017 0909   BUN 20 02/27/2017 0909   CREATININE 1.84 (H) 02/27/2017 0909   CALCIUM 9.4 02/27/2017 0909   PROT 7.0 10/09/2016 0506   ALBUMIN 3.5 10/09/2016 0506   AST 27 10/09/2016 0506   ALT 21 10/09/2016 0506   ALT 79 (H) 10/20/2014 1225   ALKPHOS 27 (L) 10/09/2016 0506   BILITOT 0.7 10/09/2016 0506   GFRNONAA 35 (L) 02/27/2017 0909   GFRAA 40 (L) 02/27/2017 0909   Lab Results  Component Value Date   CHOL 194 10/09/2016   HDL 28 (L) 10/09/2016   LDLCALC 113 (H) 10/09/2016   TRIG 265 (H) 10/09/2016   CHOLHDL 6.9 10/09/2016   Lab Results  Component Value Date   HGBA1C 7.0 (H) 10/09/2016   No results found for: VITAMINB12 Lab Results  Component Value Date   TSH 2.090 10/08/2016       ASSESSMENT AND PLAN  Post concussion syndrome  Chronic intractable headache, unspecified headache type - Plan: MR BRAIN WO CONTRAST  Post-concussion headache  Neck pain  Insomnia, unspecified type  Diabetic polyneuropathy associated with type 2 diabetes mellitus ([place])  1.    We will check an MRI of the brain due to his intractable headache. Need to be done under general general anesthetic as he was unable to do a standard MRI 2.    Temazepam daily at bedtime 3.    Stop the trazodone..    Continue nortriptyline 25 mg nightly. As he is on amiodarone, I would be reluctant to increase the dose much further. 4.    Trigger point injections of the splenius capitis muscles with 80 mg Depo-Medrol in [place] using sterile technique. He tolerated the injections well and there were no complications.  He will return to see me in 3-4 months but we will call with the results of the MRI and see him  back sooner based on the findings or if there are new or worsening neurologic symptoms.   Penda Venturi A. Felecia Shelling, MD, [facility]  74/05/8785, 7:67 AM Certified in Neurology, Clinical Neurophysiology, Sleep Medicine, Pain Medicine and Neuroimaging  [facility] [address] [place], [place] [address] [phone]

## 2017-04-25 MED ORDER — IOPAMIDOL (ISOVUE-300) INJECTION 61%
INTRAVENOUS | Status: AC
Start: 2017-04-25 — End: 2017-04-26
  Filled 2017-04-25: qty 75

## 2017-04-30 ENCOUNTER — Telehealth: Payer: Self-pay | Admitting: Neurology

## 2017-04-30 NOTE — Telephone Encounter (Signed)
I have spoken with wife, who sts. pt. is still falling--for different reasons--once was b/c he was backing up to sit on the bed, misjudged how far away he was from the bed, so missed it when he sat down. He has a cane and a walker.  I have advised he use the walker until sx. of postconcussive syndrome have resolved. No changes in cognition.  Once MRI is done, RAS will make any nec. changes to tx. plan. I have offered gait/balance training, but wife sts. pt's cardiologist at the [facility] has not cleared him for rehab following recent MI.  I will call her with MRI results/fim

## 2017-04-30 NOTE — Telephone Encounter (Signed)
Patient's wife calling.  Patient has fallen 3 times since the last office visit with Dr. Felecia Shelling. The last time he fell he knocked a cabinet over and broke his left big toe. He has an MRI scheduled on 05-09-17 and she feels the patient needs to be seen after his results. He is scheduled with Dr. Felecia Shelling on 08-15-17.

## 2017-05-01 ENCOUNTER — Ambulatory Visit: Payer: 59 | Admitting: Neurology

## 2017-05-08 ENCOUNTER — Other Ambulatory Visit: Payer: Self-pay

## 2017-05-08 ENCOUNTER — Encounter (HOSPITAL_COMMUNITY): Payer: Self-pay | Admitting: *Deleted

## 2017-05-08 NOTE — Progress Notes (Signed)
SDW-pre-op call completed by pt spouse, Arlene (DPR). Spouse denies any acute cardiopulmonary issues. Spouse stated that pt is under the care of Dr. Raynelle Dick, Cardiology, at the [facility] and Dr. Irish Lack, Cardiology (sees NP [place]). Spouse made aware to have pt take half dose of Levemir Insulin tonight (20-30 units) and half the morning of procedure if blood glucose (BG) is > 70. Pt made aware to check BG every 2 hours prior to arrival to hospital on DOS. Pt made aware to treat a BG < 70 with 4 glucose tabs, wait 15 minutes after taking tabs to recheck BG, if BG remains < 70, call Short Stay unit to speak with a nurse. Spouse also made aware to have pt take half of correction dose of Novolog insulin for BG > 220 the morning of procedure. Spouse verbalized understanding of all pre-op instructions.

## 2017-05-08 NOTE — Progress Notes (Addendum)
Anesthesia Chart Review:  Pt is a same day work up.   Pt is a 71 year old male scheduled for MRI with anesthesia on 05/09/2017.   - Primary care and cardiology care at the [place] in [place]. Has also seen Larae Grooms, MD with cardiology in the past, last office visit 10/17/16 with Lyda Jester, PA after hospital discharge s/p DES.   - Neurologist is Arlice Colt, MD who is following pt for postconcussive syndrome  PMH includes:  CAD (s/p CABG 4 2010; DES to SVG-OM2OM3 10/10/16), PSVT (2012), atrial fibrillation, AAA (3.6cm by 08/24/16 [place]), HTN, DM, hyperlipidemia, empyema (2012), OSA, CKD (stage III), hypothyroidism, iron deficiency anemia, renal cell carcinoma (s/p R nephrectomy 2002), lung cancer, ulcerative proctitis, GERD. History cocaine abuse. Never smoker. BMI 35.  - Hospitalized 4/30-10/11/16 for NSTEMI. S/p DES to SVG to Om2/OM3  Medications include: Amiodarone, Eliquis, Plavix, iron, NovoLog, Levemir, Imdur, levothyroxine, methylprednisolone, metoprolol, AcipHex, rosuvastatin  Labs will be obtained day of surgery  CXR 02/27/17:  1.  No acute cardiopulmonary disease. 2.  Aortic Atherosclerosis   EKG 02/27/17:  - Sinus bradycardia (55 bpm). LAD. RBBB. LVH with QRS widening. Inferior infarct, age undetermined. Anterolateral infarct, age undetermined - Appears stable when compared to EKG 10/11/16.   Echo 10/11/16:  - Left ventricle: The cavity size was normal. Wall thickness was increased in a pattern of moderate LVH. Systolic function was normal. The estimated ejection fraction was in the range of 55% to 60%. Features are consistent with a pseudonormal left ventricular filling pattern, with concomitant abnormal relaxation and increased filling pressure (grade 2 diastolic dysfunction). - Mitral valve: Calcified annulus. Mildly thickened leaflets. There was mild regurgitation. - Left atrium: The atrium was mildly dilated. - Impressions: Poor acoustic  windows limit study.  Cardiac cath 10/10/16:   Prox LAD to Mid LAD lesion, 100 %stenosed.  Prox RCA to Mid RCA lesion, 100 %stenosed.  Mid Cx lesion, 95 %stenosed.  Ost 2nd Mrg to 2nd Mrg lesion, 100 %stenosed.  Dist Cx lesion, 100 %stenosed.  LIMA and is normal in caliber and anatomically normal.  SVG and is normal in caliber and anatomically normal.  Seq SVG- OM2-OM3 and is normal in caliber.  Dist Graft-2 lesion, 50 %stenosed.  SVG.  Origin lesion, 100 %stenosed.  A STENT SYNERGY DES [id number] drug eluting stent was successfully placed.  Dist Graft-1 lesion, 95 %stenosed.  Post intervention, there is a 0% residual stenosis.  Prox Graft to Mid Graft lesion, 25 %stenosed. 1. Severe native CAD with total occlusion of the proximal LAD at a site of prior proximal stenting after a small diagonal vessel; subtotal occlusion of the circumflex vessel prior to the OM2, with occlusion of the ostium of the OM1, and diffuse 95% mid AV groove stenosis with total distal occlusion before the distal marginal branch; and total proximal RCA occlusion.  2. Patent LIMA graft which supplies the mid LAD and 2 diagonal vessels.  3. The sequential vein graft which supplies a large OM 2 - OM3  has mild diffuse 20-30% stenosis proximally in the graft and in the mid body of the graft there is a sequential stenosis of 95%, followed by eccentric 50% narrowing with TIMI-3 flow down both marginal branches.  4. Ostially occluded vein graft which has supplied a diagonal vessel.  5. Patent SVG to the distal RCA.  6. Successful PCI to the sequential SVG supplying the marginal vessels with ultimate insertion of a 3.028 mm Synergy DES stent to  cover both lesions, postdilated to 3.18 mm with the 95 and 50% stenoses being reduced to 0%.  There is brisk TIMI-3 flow down to both marginal branches.   CT abd/pelvis 09/06/14:  1. The area under surveillance in the right lower lobe is consistent with a small  chronic focus of atelectasis. No further CT imaging for surveillance is necessary. 2. Small hiatal hernia and diffuse esophageal thickening. Unchanged since 2014. Clinical correlation suggested. - Note: AAA is not mentioned in this letter. Based on reported findings, I believe it was a CT chest, not abd/pelvis.   Abdominal US 08/17/14:  1. Hepatic steatosis. 2. Aneurysmal dilatation of the abdominal aorta. Recommend followup by ultrasound in 2 years.    If labs acceptable day of surgery, I anticipate pt can proceed as scheduled.   Willeen Cass, FNP-BC [facility] Short Stay Surgical Center/Anesthesiology Phone: [phone] 05/08/2017 1:10 PM

## 2017-05-09 ENCOUNTER — Ambulatory Visit (HOSPITAL_COMMUNITY): Payer: 59 | Admitting: Anesthesiology

## 2017-05-09 ENCOUNTER — Ambulatory Visit (HOSPITAL_COMMUNITY)
Admission: RE | Admit: 2017-05-09 | Discharge: 2017-05-09 | Disposition: A | Discharge status: Home / Self Care | Payer: 59 | Source: Ambulatory Visit | Attending: Neurology | Admitting: Neurology

## 2017-05-09 ENCOUNTER — Encounter (HOSPITAL_COMMUNITY): Admission: RE | Disposition: A | Discharge status: Home / Self Care | Payer: Self-pay | Source: Ambulatory Visit

## 2017-05-09 ENCOUNTER — Encounter (HOSPITAL_COMMUNITY): Payer: Self-pay

## 2017-05-09 DIAGNOSIS — I129 Hypertensive chronic kidney disease with stage 1 through stage 4 chronic kidney disease, or unspecified chronic kidney disease: Secondary | ICD-10-CM | POA: Diagnosis not present

## 2017-05-09 DIAGNOSIS — G4733 Obstructive sleep apnea (adult) (pediatric): Secondary | ICD-10-CM | POA: Insufficient documentation

## 2017-05-09 DIAGNOSIS — I252 Old myocardial infarction: Secondary | ICD-10-CM | POA: Diagnosis not present

## 2017-05-09 DIAGNOSIS — I2510 Atherosclerotic heart disease of native coronary artery without angina pectoris: Secondary | ICD-10-CM | POA: Insufficient documentation

## 2017-05-09 DIAGNOSIS — I480 Paroxysmal atrial fibrillation: Secondary | ICD-10-CM | POA: Diagnosis not present

## 2017-05-09 DIAGNOSIS — F1411 Cocaine abuse, in remission: Secondary | ICD-10-CM | POA: Diagnosis not present

## 2017-05-09 DIAGNOSIS — R51 Headache: Principal | ICD-10-CM

## 2017-05-09 DIAGNOSIS — Z85528 Personal history of other malignant neoplasm of kidney: Secondary | ICD-10-CM | POA: Diagnosis not present

## 2017-05-09 DIAGNOSIS — E1122 Type 2 diabetes mellitus with diabetic chronic kidney disease: Secondary | ICD-10-CM | POA: Insufficient documentation

## 2017-05-09 DIAGNOSIS — I471 Supraventricular tachycardia: Secondary | ICD-10-CM | POA: Diagnosis not present

## 2017-05-09 DIAGNOSIS — N183 Chronic kidney disease, stage 3 (moderate): Secondary | ICD-10-CM | POA: Insufficient documentation

## 2017-05-09 DIAGNOSIS — F40240 Claustrophobia: Secondary | ICD-10-CM | POA: Diagnosis not present

## 2017-05-09 DIAGNOSIS — Z85118 Personal history of other malignant neoplasm of bronchus and lung: Secondary | ICD-10-CM | POA: Diagnosis not present

## 2017-05-09 DIAGNOSIS — E785 Hyperlipidemia, unspecified: Secondary | ICD-10-CM | POA: Diagnosis not present

## 2017-05-09 DIAGNOSIS — Z794 Long term (current) use of insulin: Secondary | ICD-10-CM | POA: Insufficient documentation

## 2017-05-09 DIAGNOSIS — E11649 Type 2 diabetes mellitus with hypoglycemia without coma: Secondary | ICD-10-CM | POA: Insufficient documentation

## 2017-05-09 DIAGNOSIS — I4891 Unspecified atrial fibrillation: Secondary | ICD-10-CM | POA: Diagnosis not present

## 2017-05-09 DIAGNOSIS — I714 Abdominal aortic aneurysm, without rupture: Secondary | ICD-10-CM | POA: Diagnosis not present

## 2017-05-09 DIAGNOSIS — Z951 Presence of aortocoronary bypass graft: Secondary | ICD-10-CM | POA: Insufficient documentation

## 2017-05-09 DIAGNOSIS — R519 Headache, unspecified: Secondary | ICD-10-CM

## 2017-05-09 DIAGNOSIS — I10 Essential (primary) hypertension: Secondary | ICD-10-CM | POA: Diagnosis not present

## 2017-05-09 HISTORY — DX: Unspecified osteoarthritis, unspecified site: M19.90

## 2017-05-09 HISTORY — DX: Repeated falls: R29.6

## 2017-05-09 HISTORY — DX: Depression, unspecified: F32.A

## 2017-05-09 HISTORY — DX: Major depressive disorder, single episode, unspecified: F32.9

## 2017-05-09 HISTORY — PX: RADIOLOGY WITH ANESTHESIA: SHX6223

## 2017-05-09 HISTORY — DX: Pneumonia, unspecified organism: J18.9

## 2017-05-09 HISTORY — DX: Sleep apnea, unspecified: G47.30

## 2017-05-09 HISTORY — DX: Postconcussional syndrome: F07.81

## 2017-05-09 HISTORY — DX: Gastro-esophageal reflux disease without esophagitis: K21.9

## 2017-05-09 HISTORY — DX: Presence of dental prosthetic device (complete) (partial): Z97.2

## 2017-05-09 HISTORY — DX: Acute myocardial infarction, unspecified: I21.9

## 2017-05-09 HISTORY — DX: Headache: R51

## 2017-05-09 LAB — GLUCOSE, CAPILLARY
GLUCOSE-CAPILLARY: 85 mg/dL (ref 65–99)
GLUCOSE-CAPILLARY: 120 mg/dL — AB (ref 65–99)
GLUCOSE-CAPILLARY: 149 mg/dL — AB (ref 65–99)
Glucose-Capillary: 81 mg/dL (ref 65–99)
Glucose-Capillary: 69 mg/dL (ref 65–99)
Glucose-Capillary: 61 mg/dL — ABNORMAL LOW (ref 65–99)

## 2017-05-09 LAB — HEMOGLOBIN A1C
HEMOGLOBIN A1C: 8.6 % — AB (ref 4.8–5.6)
Mean Plasma Glucose: 200.12 mg/dL

## 2017-05-09 LAB — CBC
HEMATOCRIT: 40.1 % (ref 39.0–52.0)
HEMOGLOBIN: 13.3 g/dL (ref 13.0–17.0)
MCH: 31.9 pg (ref 26.0–34.0)
MCHC: 33.2 g/dL (ref 30.0–36.0)
MCV: 96.2 fL (ref 78.0–100.0)
Platelets: 208 10*3/uL (ref 150–400)
RBC: 4.17 MIL/uL — AB (ref 4.22–5.81)
RDW: 14.3 % (ref 11.5–15.5)
WBC: 6.5 10*3/uL (ref 4.0–10.5)

## 2017-05-09 LAB — PROTIME-INR
INR: 1.36
Prothrombin Time: 16.7 seconds — ABNORMAL HIGH (ref 11.4–15.2)

## 2017-05-09 LAB — COMPREHENSIVE METABOLIC PANEL
ALBUMIN: 3.3 g/dL — AB (ref 3.5–5.0)
ALK PHOS: 35 U/L — AB (ref 38–126)
ALT: 23 U/L (ref 17–63)
ANION GAP: 9 (ref 5–15)
AST: 28 U/L (ref 15–41)
BILIRUBIN TOTAL: 0.3 mg/dL (ref 0.3–1.2)
BUN: 19 mg/dL (ref 6–20)
CO2: 24 mmol/L (ref 22–32)
Calcium: 9 mg/dL (ref 8.9–10.3)
Chloride: 103 mmol/L (ref 101–111)
Creatinine, Ser: 1.98 mg/dL — ABNORMAL HIGH (ref 0.61–1.24)
GFR calc Af Amer: 37 mL/min — ABNORMAL LOW (ref >60)
GFR calc non Af Amer: 32 mL/min — ABNORMAL LOW (ref >60)
GLUCOSE: 80 mg/dL (ref 65–99)
Potassium: 3.9 mmol/L (ref 3.5–5.1)
SODIUM: 136 mmol/L (ref 135–145)
Total Protein: 6.6 g/dL (ref 6.5–8.1)

## 2017-05-09 SURGERY — MRI WITH ANESTHESIA
Anesthesia: General

## 2017-05-09 MED ORDER — DEXTROSE 50 % IV SOLN
25.0000 mL | Freq: Once | INTRAVENOUS | Status: AC
  Administered 2017-05-09: 25 mL via INTRAVENOUS

## 2017-05-09 MED ORDER — LACTATED RINGERS IV SOLN
INTRAVENOUS | Status: DC
  Administered 2017-05-09: 08:00:00 via INTRAVENOUS

## 2017-05-09 MED ORDER — DEXTROSE 50 % IV SOLN
INTRAVENOUS | Status: AC
  Filled 2017-05-09: qty 50

## 2017-05-09 NOTE — Progress Notes (Signed)
Hypoglycemic Event  CBG: 61 @ 0944  Treatment: called MD; 1/2 amp D 50 given  Symptoms clammy; weak  Follow-up CBG: Time:1004 CBG Result:120  Possible Reasons for Event: pt has been NPO for procedure; took full dose of insulin last night  Comments/MD notified: Anesthesia aware; will recheck one more time prior to discharge. Pt sitting up eating peanut butter crackers; tol PO's well    Clearnce Sorrel

## 2017-05-09 NOTE — Transfer of Care (Signed)
Immediate Anesthesia Transfer of Care Note  Patient: Matthew [name]  Procedure(s) Performed: MRI WITH ANESTHESIA   BRAIN WITHOUT CONTRAST (N/A )  Patient Location: PACU  Anesthesia Type:General  Level of Consciousness: drowsy  Airway & Oxygen Therapy: Patient Spontanous Breathing and Patient connected to nasal cannula oxygen  Post-op Assessment: Report given to RN, Post -op Vital signs reviewed and stable and Patient moving all extremities X 4  Post vital signs: Reviewed and stable  Last Vitals:  Vitals:   05/09/17 0617 05/09/17 0941  BP: 116/69   Pulse: 63   Resp: 18   Temp: 36.7 C 36.6 C  SpO2: 97%     Last Pain:  Vitals:   05/09/17 0617  TempSrc: Oral         Complications: No apparent anesthesia complications

## 2017-05-09 NOTE — Anesthesia Preprocedure Evaluation (Signed)
Anesthesia Evaluation  Patient identified by MRN, date of birth, ID band Patient awake    Reviewed: Allergy & Precautions, NPO status , Patient's Chart, lab work & pertinent test results  Airway Mallampati: II       Dental   Pulmonary           Cardiovascular hypertension,      Neuro/Psych    GI/Hepatic   Endo/Other  diabetes  Renal/GU      Musculoskeletal   Abdominal   Peds  Hematology   Anesthesia Other Findings   Reproductive/Obstetrics                             Anesthesia Physical Anesthesia Plan Anesthesia Quick Evaluation

## 2017-05-10 ENCOUNTER — Encounter (HOSPITAL_COMMUNITY): Payer: Self-pay | Admitting: Radiology

## 2017-05-10 ENCOUNTER — Telehealth: Payer: Self-pay | Admitting: *Deleted

## 2017-05-10 MED FILL — Propofol IV Emul 200 MG/20ML (10 MG/ML): INTRAVENOUS | Qty: 20 | Status: AC

## 2017-05-10 MED FILL — Lactated Ringer's Solution: INTRAVENOUS | Qty: 1000 | Status: AC

## 2017-05-10 MED FILL — Fentanyl Citrate Preservative Free (PF) Inj 100 MCG/2ML: INTRAMUSCULAR | Qty: 2 | Status: AC

## 2017-05-10 MED FILL — Midazolam HCl Inj 2 MG/2ML (Base Equivalent): INTRAMUSCULAR | Qty: 2 | Status: AC

## 2017-05-10 NOTE — Telephone Encounter (Signed)
-----   Message from Britt Bottom, MD sent at 05/10/2017 11:58 AM EST ----- Please let him know that the MRI showed some age related atrophy out of age related ischemic changes  but nothing that looked to be acute and nothing that could explain his headaches.

## 2017-05-10 NOTE — Telephone Encounter (Signed)
Spoke with Mrs. Mclees and reviewed below MRI results.  She verbalized understanding of same/fim

## 2017-07-01 ENCOUNTER — Telehealth: Payer: Self-pay | Admitting: Neurology

## 2017-07-01 NOTE — Telephone Encounter (Signed)
Pt wife(on Dpr) has called stating that pt is having head aches every day.  Pt wife would like to know if something can be called in for him to take during the day that will not make him sleepy.  Pt already takes something at night.  Pt wife would like medication to be sent to  [id number](Phone) [phone](Fax)   There are no changes to pt insurance.  Pt wife would like to know if Dr SFelecia Shellingwill refill pt's ondansetron (ZOFRAN-ODT) 8 MG disintegrating tablet since pt has a [employer]/textile technologist his PCP will no longer fill it.

## 2017-07-23 DIAGNOSIS — R05 Cough: Secondary | ICD-10-CM | POA: Diagnosis not present

## 2017-07-23 DIAGNOSIS — J189 Pneumonia, unspecified organism: Secondary | ICD-10-CM | POA: Diagnosis not present

## 2017-07-23 DIAGNOSIS — R1110 Vomiting, unspecified: Secondary | ICD-10-CM | POA: Diagnosis not present

## 2017-07-28 ENCOUNTER — Encounter (HOSPITAL_COMMUNITY): Payer: Self-pay | Admitting: Emergency Medicine

## 2017-07-28 ENCOUNTER — Observation Stay (HOSPITAL_COMMUNITY)
Admission: EM | Admit: 2017-07-28 | Discharge: 2017-07-29 | Disposition: A | Discharge status: Home / Self Care | Payer: 59 | Attending: Internal Medicine | Admitting: Internal Medicine

## 2017-07-28 DIAGNOSIS — E1142 Type 2 diabetes mellitus with diabetic polyneuropathy: Secondary | ICD-10-CM | POA: Insufficient documentation

## 2017-07-28 DIAGNOSIS — I480 Paroxysmal atrial fibrillation: Secondary | ICD-10-CM | POA: Diagnosis present

## 2017-07-28 DIAGNOSIS — R079 Chest pain, unspecified: Secondary | ICD-10-CM | POA: Diagnosis present

## 2017-07-28 DIAGNOSIS — E1159 Type 2 diabetes mellitus with other circulatory complications: Secondary | ICD-10-CM | POA: Diagnosis not present

## 2017-07-28 DIAGNOSIS — I25118 Atherosclerotic heart disease of native coronary artery with other forms of angina pectoris: Secondary | ICD-10-CM | POA: Insufficient documentation

## 2017-07-28 DIAGNOSIS — Z9119 Patient's noncompliance with other medical treatment and regimen: Secondary | ICD-10-CM | POA: Insufficient documentation

## 2017-07-28 DIAGNOSIS — Z955 Presence of coronary angioplasty implant and graft: Secondary | ICD-10-CM | POA: Insufficient documentation

## 2017-07-28 DIAGNOSIS — I714 Abdominal aortic aneurysm, without rupture, unspecified: Secondary | ICD-10-CM | POA: Diagnosis present

## 2017-07-28 DIAGNOSIS — E039 Hypothyroidism, unspecified: Secondary | ICD-10-CM | POA: Insufficient documentation

## 2017-07-28 DIAGNOSIS — Z794 Long term (current) use of insulin: Secondary | ICD-10-CM | POA: Insufficient documentation

## 2017-07-28 DIAGNOSIS — K5190 Ulcerative colitis, unspecified, without complications: Secondary | ICD-10-CM | POA: Diagnosis not present

## 2017-07-28 DIAGNOSIS — Z9989 Dependence on other enabling machines and devices: Secondary | ICD-10-CM | POA: Diagnosis not present

## 2017-07-28 DIAGNOSIS — N183 Chronic kidney disease, stage 3 unspecified: Secondary | ICD-10-CM | POA: Diagnosis present

## 2017-07-28 DIAGNOSIS — F329 Major depressive disorder, single episode, unspecified: Secondary | ICD-10-CM | POA: Insufficient documentation

## 2017-07-28 DIAGNOSIS — D631 Anemia in chronic kidney disease: Secondary | ICD-10-CM | POA: Insufficient documentation

## 2017-07-28 DIAGNOSIS — Z951 Presence of aortocoronary bypass graft: Secondary | ICD-10-CM | POA: Diagnosis not present

## 2017-07-28 DIAGNOSIS — E782 Mixed hyperlipidemia: Secondary | ICD-10-CM | POA: Diagnosis not present

## 2017-07-28 DIAGNOSIS — Z7901 Long term (current) use of anticoagulants: Secondary | ICD-10-CM | POA: Diagnosis not present

## 2017-07-28 DIAGNOSIS — Z85118 Personal history of other malignant neoplasm of bronchus and lung: Secondary | ICD-10-CM | POA: Insufficient documentation

## 2017-07-28 DIAGNOSIS — Z85528 Personal history of other malignant neoplasm of kidney: Secondary | ICD-10-CM | POA: Insufficient documentation

## 2017-07-28 DIAGNOSIS — D509 Iron deficiency anemia, unspecified: Secondary | ICD-10-CM | POA: Insufficient documentation

## 2017-07-28 DIAGNOSIS — E1122 Type 2 diabetes mellitus with diabetic chronic kidney disease: Secondary | ICD-10-CM | POA: Insufficient documentation

## 2017-07-28 DIAGNOSIS — G4733 Obstructive sleep apnea (adult) (pediatric): Secondary | ICD-10-CM | POA: Insufficient documentation

## 2017-07-28 DIAGNOSIS — Z79890 Hormone replacement therapy (postmenopausal): Secondary | ICD-10-CM | POA: Insufficient documentation

## 2017-07-28 DIAGNOSIS — I4510 Unspecified right bundle-branch block: Secondary | ICD-10-CM | POA: Diagnosis not present

## 2017-07-28 DIAGNOSIS — Z7902 Long term (current) use of antithrombotics/antiplatelets: Secondary | ICD-10-CM | POA: Insufficient documentation

## 2017-07-28 DIAGNOSIS — I10 Essential (primary) hypertension: Secondary | ICD-10-CM | POA: Diagnosis present

## 2017-07-28 DIAGNOSIS — I129 Hypertensive chronic kidney disease with stage 1 through stage 4 chronic kidney disease, or unspecified chronic kidney disease: Secondary | ICD-10-CM | POA: Diagnosis not present

## 2017-07-28 DIAGNOSIS — Z905 Acquired absence of kidney: Secondary | ICD-10-CM | POA: Diagnosis not present

## 2017-07-28 DIAGNOSIS — M109 Gout, unspecified: Secondary | ICD-10-CM | POA: Insufficient documentation

## 2017-07-28 DIAGNOSIS — I252 Old myocardial infarction: Secondary | ICD-10-CM | POA: Diagnosis not present

## 2017-07-28 DIAGNOSIS — K589 Irritable bowel syndrome without diarrhea: Secondary | ICD-10-CM | POA: Diagnosis not present

## 2017-07-28 DIAGNOSIS — Z79899 Other long term (current) drug therapy: Secondary | ICD-10-CM | POA: Insufficient documentation

## 2017-07-28 DIAGNOSIS — E559 Vitamin D deficiency, unspecified: Secondary | ICD-10-CM | POA: Insufficient documentation

## 2017-07-28 DIAGNOSIS — K219 Gastro-esophageal reflux disease without esophagitis: Secondary | ICD-10-CM | POA: Insufficient documentation

## 2017-07-28 DIAGNOSIS — Z8249 Family history of ischemic heart disease and other diseases of the circulatory system: Secondary | ICD-10-CM | POA: Insufficient documentation

## 2017-07-28 DIAGNOSIS — I208 Other forms of angina pectoris: Secondary | ICD-10-CM

## 2017-07-28 LAB — I-STAT TROPONIN, ED: TROPONIN I, POC: 0 ng/mL (ref 0.00–0.08)

## 2017-07-28 NOTE — ED Triage Notes (Signed)
Brought by ems from home for c/o left chest pain that started suddenly tonight.  Took 1 ntg at home with no relief.  Also endorses feeling SOB and nauseated.  CBG-200 via ems.

## 2017-07-29 ENCOUNTER — Emergency Department (HOSPITAL_COMMUNITY): Payer: 59

## 2017-07-29 ENCOUNTER — Encounter (HOSPITAL_COMMUNITY): Payer: Self-pay | Admitting: Internal Medicine

## 2017-07-29 DIAGNOSIS — N183 Chronic kidney disease, stage 3 (moderate): Secondary | ICD-10-CM

## 2017-07-29 DIAGNOSIS — I25118 Atherosclerotic heart disease of native coronary artery with other forms of angina pectoris: Secondary | ICD-10-CM

## 2017-07-29 DIAGNOSIS — E1159 Type 2 diabetes mellitus with other circulatory complications: Secondary | ICD-10-CM

## 2017-07-29 DIAGNOSIS — R079 Chest pain, unspecified: Secondary | ICD-10-CM | POA: Diagnosis not present

## 2017-07-29 DIAGNOSIS — I480 Paroxysmal atrial fibrillation: Secondary | ICD-10-CM

## 2017-07-29 DIAGNOSIS — I10 Essential (primary) hypertension: Secondary | ICD-10-CM

## 2017-07-29 LAB — CBC
HCT: 43.9 % (ref 39.0–52.0)
Hemoglobin: 14.8 g/dL (ref 13.0–17.0)
MCH: 32.9 pg (ref 26.0–34.0)
MCHC: 33.7 g/dL (ref 30.0–36.0)
MCV: 97.6 fL (ref 78.0–100.0)
PLATELETS: 232 10*3/uL (ref 150–400)
RBC: 4.5 MIL/uL (ref 4.22–5.81)
RDW: 13.2 % (ref 11.5–15.5)
WBC: 8.4 10*3/uL (ref 4.0–10.5)

## 2017-07-29 LAB — CBG MONITORING, ED
GLUCOSE-CAPILLARY: 299 mg/dL — AB (ref 65–99)
GLUCOSE-CAPILLARY: 207 mg/dL — AB (ref 65–99)
Glucose-Capillary: 150 mg/dL — ABNORMAL HIGH (ref 65–99)

## 2017-07-29 LAB — BASIC METABOLIC PANEL
Anion gap: 12 (ref 5–15)
BUN: 18 mg/dL (ref 6–20)
CO2: 21 mmol/L — AB (ref 22–32)
CREATININE: 1.79 mg/dL — AB (ref 0.61–1.24)
Calcium: 9.2 mg/dL (ref 8.9–10.3)
Chloride: 102 mmol/L (ref 101–111)
GFR calc Af Amer: 42 mL/min — ABNORMAL LOW (ref >60)
GFR calc non Af Amer: 36 mL/min — ABNORMAL LOW (ref >60)
GLUCOSE: 134 mg/dL — AB (ref 65–99)
Potassium: 4.8 mmol/L (ref 3.5–5.1)
SODIUM: 135 mmol/L (ref 135–145)

## 2017-07-29 LAB — APTT: APTT: 34 s (ref 24–36)

## 2017-07-29 LAB — HEPATIC FUNCTION PANEL
ALK PHOS: 35 U/L — AB (ref 38–126)
ALT: 34 U/L (ref 17–63)
AST: 35 U/L (ref 15–41)
Albumin: 3.2 g/dL — ABNORMAL LOW (ref 3.5–5.0)
BILIRUBIN DIRECT: 0.2 mg/dL (ref 0.1–0.5)
BILIRUBIN INDIRECT: 0.6 mg/dL (ref 0.3–0.9)
BILIRUBIN TOTAL: 0.8 mg/dL (ref 0.3–1.2)
Total Protein: 6.8 g/dL (ref 6.5–8.1)

## 2017-07-29 LAB — LIPASE, BLOOD: Lipase: 22 U/L (ref 11–51)

## 2017-07-29 LAB — HEPARIN LEVEL (UNFRACTIONATED)

## 2017-07-29 LAB — TROPONIN I: Troponin I: <0.03 ng/mL (ref <0.03)

## 2017-07-29 MED ORDER — MESALAMINE 1000 MG RE SUPP
1000.0000 mg | Freq: Every evening | RECTAL | Status: DC | PRN
  Filled 2017-07-29: qty 1

## 2017-07-29 MED ORDER — FINASTERIDE 5 MG PO TABS
5.0000 mg | ORAL_TABLET | Freq: Every day | ORAL | Status: DC
  Administered 2017-07-29: 5 mg via ORAL
  Filled 2017-07-29 (×2): qty 1

## 2017-07-29 MED ORDER — OMEGA-3-ACID ETHYL ESTERS 1 G PO CAPS
2000.0000 mg | ORAL_CAPSULE | Freq: Two times a day (BID) | ORAL | Status: DC
  Administered 2017-07-29: 2000 mg via ORAL
  Filled 2017-07-29 (×2): qty 2

## 2017-07-29 MED ORDER — INSULIN DETEMIR 100 UNIT/ML ~~LOC~~ SOLN
5.0000 [IU] | Freq: Two times a day (BID) | SUBCUTANEOUS | Status: DC
  Administered 2017-07-29: 5 [IU] via SUBCUTANEOUS
  Filled 2017-07-29 (×2): qty 0.05

## 2017-07-29 MED ORDER — PANTOPRAZOLE SODIUM 40 MG PO TBEC
40.0000 mg | DELAYED_RELEASE_TABLET | Freq: Every day | ORAL | Status: DC
  Administered 2017-07-29: 40 mg via ORAL
  Filled 2017-07-29: qty 1

## 2017-07-29 MED ORDER — LORAZEPAM 1 MG PO TABS
1.0000 mg | ORAL_TABLET | Freq: Once | ORAL | Status: AC
  Administered 2017-07-29: 1 mg via ORAL
  Filled 2017-07-29: qty 1

## 2017-07-29 MED ORDER — ISOSORBIDE MONONITRATE ER 60 MG PO TB24: 60.0000 mg | ORAL_TABLET | Freq: Every day | ORAL | 0 refills | Status: DC

## 2017-07-29 MED ORDER — METOPROLOL SUCCINATE ER 25 MG PO TB24
12.5000 mg | ORAL_TABLET | Freq: Every day | ORAL | Status: DC
  Administered 2017-07-29: 12.5 mg via ORAL
  Filled 2017-07-29 (×2): qty 1

## 2017-07-29 MED ORDER — LINACLOTIDE 290 MCG PO CAPS: 290.0000 ug | ORAL_CAPSULE | Freq: Every day | ORAL | Status: DC

## 2017-07-29 MED ORDER — INSULIN ASPART 100 UNIT/ML ~~LOC~~ SOLN
0.0000 [IU] | SUBCUTANEOUS | Status: DC
  Administered 2017-07-29: 1 [IU] via SUBCUTANEOUS
  Administered 2017-07-29: 5 [IU] via SUBCUTANEOUS
  Administered 2017-07-29: 3 [IU] via SUBCUTANEOUS
  Filled 2017-07-29 (×3): qty 1

## 2017-07-29 MED ORDER — GABAPENTIN 300 MG PO CAPS
300.0000 mg | ORAL_CAPSULE | Freq: Two times a day (BID) | ORAL | Status: DC
  Administered 2017-07-29: 300 mg via ORAL
  Filled 2017-07-29: qty 1

## 2017-07-29 MED ORDER — AMIODARONE HCL 100 MG PO TABS
100.0000 mg | ORAL_TABLET | Freq: Two times a day (BID) | ORAL | Status: DC
  Administered 2017-07-29: 100 mg via ORAL
  Filled 2017-07-29: qty 1

## 2017-07-29 MED ORDER — AMIODARONE HCL 200 MG PO TABS: 100.0000 mg | ORAL_TABLET | Freq: Two times a day (BID) | ORAL | Status: DC

## 2017-07-29 MED ORDER — FERROUS SULFATE 325 (65 FE) MG PO TABS
325.0000 mg | ORAL_TABLET | ORAL | Status: DC
Start: 2017-07-29 — End: 2017-07-29

## 2017-07-29 MED ORDER — OXYCODONE-ACETAMINOPHEN 5-325 MG PO TABS
1.0000 | ORAL_TABLET | Freq: Four times a day (QID) | ORAL | Status: DC | PRN
  Administered 2017-07-29: 1 via ORAL
  Filled 2017-07-29: qty 1

## 2017-07-29 MED ORDER — MAGNESIUM OXIDE 400 (241.3 MG) MG PO TABS
400.0000 mg | ORAL_TABLET | Freq: Every day | ORAL | Status: DC
  Administered 2017-07-29: 400 mg via ORAL
  Filled 2017-07-29 (×2): qty 1

## 2017-07-29 MED ORDER — LEVOTHYROXINE SODIUM 75 MCG PO TABS
150.0000 ug | ORAL_TABLET | Freq: Every day | ORAL | Status: DC
  Administered 2017-07-29: 150 ug via ORAL
  Filled 2017-07-29 (×2): qty 2

## 2017-07-29 MED ORDER — TEMAZEPAM 7.5 MG PO CAPS
7.5000 mg | ORAL_CAPSULE | Freq: Every day | ORAL | Status: DC
  Administered 2017-07-29: 7.5 mg via ORAL
  Filled 2017-07-29: qty 1

## 2017-07-29 MED ORDER — NORTRIPTYLINE HCL 25 MG PO CAPS
25.0000 mg | ORAL_CAPSULE | Freq: Every day | ORAL | Status: DC
  Administered 2017-07-29 (×2): 25 mg via ORAL
  Filled 2017-07-29 (×2): qty 1

## 2017-07-29 MED ORDER — ACETAMINOPHEN 325 MG PO TABS: 650.0000 mg | ORAL_TABLET | ORAL | Status: DC | PRN

## 2017-07-29 MED ORDER — INSULIN DETEMIR 100 UNIT/ML FLEXPEN: 20.0000 [IU] | PEN_INJECTOR | Freq: Two times a day (BID) | SUBCUTANEOUS | Status: DC

## 2017-07-29 MED ORDER — ISOSORBIDE MONONITRATE ER 60 MG PO TB24: 60.0000 mg | ORAL_TABLET | Freq: Every day | ORAL | Status: DC

## 2017-07-29 MED ORDER — OXYBUTYNIN CHLORIDE ER 10 MG PO TB24
10.0000 mg | ORAL_TABLET | Freq: Every day | ORAL | Status: DC
  Filled 2017-07-29: qty 1

## 2017-07-29 MED ORDER — OXYCODONE HCL 5 MG PO TABS
5.0000 mg | ORAL_TABLET | Freq: Four times a day (QID) | ORAL | Status: DC | PRN
  Administered 2017-07-29: 5 mg via ORAL
  Filled 2017-07-29: qty 1

## 2017-07-29 MED ORDER — OXYCODONE-ACETAMINOPHEN 10-325 MG PO TABS: 1.0000 | ORAL_TABLET | Freq: Four times a day (QID) | ORAL | Status: DC | PRN

## 2017-07-29 MED ORDER — CLOPIDOGREL BISULFATE 75 MG PO TABS
75.0000 mg | ORAL_TABLET | Freq: Every day | ORAL | Status: DC
  Administered 2017-07-29: 75 mg via ORAL
  Filled 2017-07-29: qty 1

## 2017-07-29 MED ORDER — BUPROPION HCL ER (XL) 150 MG PO TB24
150.0000 mg | ORAL_TABLET | Freq: Every day | ORAL | Status: DC
  Administered 2017-07-29: 150 mg via ORAL
  Filled 2017-07-29: qty 1

## 2017-07-29 MED ORDER — NITROGLYCERIN 0.4 MG SL SUBL: 0.4000 mg | SUBLINGUAL_TABLET | SUBLINGUAL | Status: DC | PRN

## 2017-07-29 MED ORDER — ISOSORBIDE MONONITRATE ER 30 MG PO TB24
30.0000 mg | ORAL_TABLET | Freq: Every day | ORAL | Status: DC
  Administered 2017-07-29: 30 mg via ORAL
  Filled 2017-07-29: qty 1

## 2017-07-29 MED ORDER — ONDANSETRON HCL 4 MG/2ML IJ SOLN: 4.0000 mg | Freq: Four times a day (QID) | INTRAMUSCULAR | Status: DC | PRN

## 2017-07-29 MED ORDER — HEPARIN (PORCINE) IN NACL 100-0.45 UNIT/ML-% IJ SOLN
1250.0000 [IU]/h | INTRAMUSCULAR | Status: DC
  Administered 2017-07-29: 1250 [IU]/h via INTRAVENOUS
  Filled 2017-07-29: qty 250

## 2017-07-29 MED ORDER — ROSUVASTATIN CALCIUM 20 MG PO TABS: 40.0000 mg | ORAL_TABLET | Freq: Every day | ORAL | Status: DC

## 2017-07-29 MED ORDER — MESALAMINE 1.2 G PO TBEC
1.2000 g | DELAYED_RELEASE_TABLET | Freq: Every day | ORAL | Status: DC | PRN
  Filled 2017-07-29: qty 1

## 2017-07-29 MED ORDER — FERROUS SULFATE 325 (65 FE) MG PO TABS: 325.0000 mg | ORAL_TABLET | ORAL | Status: DC

## 2017-07-29 NOTE — ED Notes (Signed)
Pt's CBG was 207, [employer] notified.

## 2017-07-29 NOTE — ED Provider Notes (Signed)
[facility] EMERGENCY DEPARTMENT Provider Note   CSN: [id number] Arrival date & time: 07/28/17  2331     History   Chief Complaint Chief Complaint  Patient presents with  . Chest Pain    HPI KASHMIR LYSAGHT is a 72 y.o. male.  HPI  This is a 72 year old male coronary artery disease, AAA, atrial fibrillation, chronic kidney disease who presents with chest pain.  Onset of chest pain prior to calling EMS.  States that he had pain that was anterior and pressure-like.  He had gotten up from watching a basketball game and walking to the bathroom when he had onset.  History of MI with CABG and drug-eluting stent on Oct 10, 2016.  Reports similar pain in the past.  He did take 2 nitroglycerin.  Pain lasted for a total of 1 hour.  He is currently pain-free.  Denies any recent shortness of breath, fevers, chills.  Does state "all weekend, just felt bad."  Reports some nausea.  Past Medical History:  Diagnosis Date  . AAA (abdominal aortic aneurysm) ([place])    a. Aortic US 05/2012: 3.9 cm fusiform abdominal aortic aneurysm. F/u recommended 05/2014.  Marland Kitchen Anemia   . Arthritis    DDD  . Atrial fibrillation ([place])    a. Post-op CABG 05/2009. b. Re-identified 12/2013.   Marland Kitchen CAD (coronary artery disease)    a. s/p CABGx4 05/2009. 10/10/16 PCI with DES x1 SVG-->OM2/OM3  . Chronic constipation   . CKD (chronic kidney disease), stage III ([place])   . Cocaine abuse in remission ([place])   . Colon polyps   . Depression   . Diabetes mellitus   . Empyema lung ([place])    a. PNA 02/2011 c/b empyema requiring chest tube, drainage of pleural effusion and decortication.  . Enlarged prostate   . Frequent falls   . GERD (gastroesophageal reflux disease)   . Headache   . Hepatic steatosis   . Hyperlipidemia   . Hypertension   . Hypothyroidism   . Iron deficiency anemia   . LAFB (left anterior fascicular block)   . Lung cancer ([place])    a. Tx with interleukin in 2002 per patient.  . Myocardial  infarction ([place])    10/08/16  . Nodule of left lung    a. Cavitary nodules 02/2011, f/u recommended.  . Obesity   . Paroxysmal supraventricular tachycardia ([place])    a. While inpatient 02/2011 sick with empyema - wide appearace due to RBBB.  . Pneumonia   . Post concussion syndrome   . RBBB   . Renal cell carcinoma    a. s/p R nephrectomy 2002. Reported pulmonary mets per note at that time.  . Scrotal abscess    a. 2012 2/2 Streptococcus group C, status post incision and drainage.  . Sleep apnea    wears CPAP  . 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  . Vitamin D deficiency   . Wears dentures     Patient Active Problem List   Diagnosis Date Noted  . Post concussion syndrome 03/13/2017  . Post-concussion headache 03/13/2017  . Neck pain 03/13/2017  . Insomnia 03/13/2017  . Diabetes, polyneuropathy ([place]) 03/13/2017  . Unstable angina ([place])   . NSTEMI (non-ST elevated myocardial infarction) ([place]) 10/08/2016  . Atrial fibrillation with RVR ([place]) 01/26/2016  . Atrial fibrillation with rapid ventricular response ([place]) 01/26/2016  . PAF (paroxysmal atrial fibrillation) ([place]) 12/21/2013  . Chronic abdominal pain 12/21/2013  .  CKD (chronic kidney disease), stage III ([place]) 12/21/2013  . H/O: lung cancer 12/21/2013  . LV dysfunction 12/21/2013  . RBBB   . LAFB (left anterior fascicular block)   . AAA (abdominal aortic aneurysm) ([place])   . Chest pain 12/20/2013  . Mixed hyperlipidemia 10/14/2013  . Obesity, unspecified 10/14/2013  . Right Empyema    .  Left upper lobe lesion or nodule   . Hypertension   . CAD (coronary artery disease)   . Renal cell carcinoma ([place])   . [place]   . Paroxysmal supraventricular tachycardia ([place])   . Hypothyroidism     Past Surgical History:  Procedure Laterality Date  . Bronchoscopy, right video-assisted thoracoscopy, minithoracotomy, drainage of pleural effusion, and decortication.  02/26/2011   [facility]  . CABG x 4   05/13/2009   [facility]  . CARDIAC SURGERY    . COLONOSCOPY W/ BIOPSIES AND POLYPECTOMY    . CORONARY STENT INTERVENTION N/A 10/10/2016   Procedure: Coronary Stent Intervention;  Surgeon: Troy Sine, MD;  Location: [facility] CV LAB;  Service: Cardiovascular;  Laterality: N/A;  . HEMORRHOID SURGERY    . HERNIA REPAIR    . Incision, drainage and debridement of perineum and  scrotum    . Irrigation and debridement of scrotal wound and closure of scrotal wound.    Marland Kitchen LEFT HEART CATH AND CORS/GRAFTS ANGIOGRAPHY N/A 10/10/2016   Procedure: Left Heart Cath and Cors/Grafts Angiography;  Surgeon: Troy Sine, MD;  Location: [facility] CV LAB;  Service: Cardiovascular;  Laterality: N/A;  . MULTIPLE TOOTH EXTRACTIONS    . RADIOLOGY WITH ANESTHESIA N/A 05/09/2017   Procedure: MRI WITH ANESTHESIA   BRAIN WITHOUT CONTRAST;  Surgeon: Radiologist, Medication, MD;  Location: [facility];  Service: Radiology;  Laterality: N/A;  . Right radical nephrectomy.    . TOOTH EXTRACTION         Home Medications    Prior to Admission medications   Medication Sig Start Date End Date Taking? Authorizing Provider  amiodarone (PACERONE) 200 MG tablet Take 1 tablet (200 mg total) by mouth daily. Patient taking differently: Take 100 mg by mouth 2 (two) times daily.  02/21/16   Lyda Jester M, PA-C  apixaban (ELIQUIS) 5 MG TABS tablet Take 5 mg by mouth 2 (two) times daily.    [provider]  buPROPion (WELLBUTRIN XL) 150 MG 24 hr tablet Take 150 mg by mouth daily.    [provider]  capsaicin (ZOSTRIX) 0.025 % cream Apply 1 application topically 2 (two) times daily as needed (for pain).     [provider]  cephALEXin (KEFLEX) 500 MG capsule Take 500 mg by mouth 2 (two) times daily. 04/24/17   [provider]  cholecalciferol (VITAMIN D) 1000 units tablet Take 1,000 Units by mouth daily.    [provider]  clopidogrel (PLAVIX) 75 MG tablet Take 1 tablet (75 mg  total) by mouth daily with breakfast. 10/12/16   Reino Bellis B, NP  clotrimazole (LOTRIMIN) 1 % cream Apply 1 application topically 2 (two) times daily as needed (for rash).     [provider]  ferrous sulfate 325 (65 FE) MG tablet Take 325 mg by mouth every Monday, Wednesday, and Friday.    [provider]  finasteride (PROSCAR) 5 MG tablet Take 5 mg by mouth daily.    [provider]  gabapentin (NEURONTIN) 300 MG capsule Take 300 mg by mouth 2 (two) times daily.     [provider]  glucose 4 GM chewable tablet Chew 1 tablet by mouth as needed for low blood sugar.    [provider]  insulin aspart (NOVOLOG) 100 UNIT/ML injection Inject 10-20 Units into the skin 3 (three) times daily before meals. Inject 10 units before breakfast and 20 units before lunch and 20 units before dinner    [provider]  Insulin Detemir (LEVEMIR FLEXPEN) 100 UNIT/ML Pen Inject 40-60 Units into the skin 2 (two) times daily.     [provider]  isosorbide mononitrate (IMDUR) 30 MG 24 hr tablet Take 30 mg by mouth daily. Hold if SB is under 100    [provider]  ketoconazole (NIZORAL) 2 % cream Apply 1 application topically daily as needed. For rash     [provider]  levothyroxine (SYNTHROID, LEVOTHROID) 150 MCG tablet Take 150 mcg by mouth daily before breakfast.    [provider]  LIDOCAINE EX Apply 1 application topically 2 (two) times daily as needed (for pain).     [provider]  linaclotide (LINZESS) 290 MCG CAPS capsule Take 290 mcg by mouth daily.    [provider]  Magnesium Oxide 420 MG TABS Take 420 mg by mouth daily.     [provider]  MENTHOL-METHYL SALICYLATE EX Apply 1 application topically as needed (pain).    [provider]  mesalamine (CANASA) 1000 MG suppository Place 1,000 mg rectally at bedtime as needed (ulcerative colitis flares).     [provider]  mesalamine (LIALDA) 1.2 g EC tablet Take 1.2 g by mouth daily as needed (ulcerative colitis flares).    [provider]  methylPREDNISolone (MEDROL) 4 MG tablet Take 4 mg by mouth 2 (two) times daily as needed (gout flares).     [provider]  metoprolol succinate (TOPROL-XL) 25 MG 24 hr tablet Take 12.5 mg by mouth daily.    [provider]  mupirocin ointment (BACTROBAN) 2 % Place 1 application into the nose daily as needed (for mersa).     [provider]  neomycin-bacitracin-polymyxin (NEOSPORIN) ointment Apply 1 application topically as needed for wound care.    [provider]  nitroGLYCERIN (NITROSTAT) 0.4 MG SL tablet Place 0.4 mg under the tongue every 5 (five) minutes as needed for chest pain (x 3 doses daily).     [provider]  nortriptyline (PAMELOR) 25 MG capsule Take 1 capsule (25 mg total) by mouth at bedtime. 04/10/17   Sater, Nanine Means, MD  nystatin cream (MYCOSTATIN) Apply 1 application topically daily as needed for dry skin (rash).    [provider]  Omega-3 Fatty Acids (FISH OIL) 1000 MG CAPS Take 2,000 mg by mouth 2 (two) times daily.     [provider]  ondansetron (ZOFRAN-ODT) 8 MG disintegrating tablet Take 8 mg by mouth 2 (two) times daily.     [provider]  oxybutynin (DITROPAN-XL) 10 MG 24 hr tablet Take 10 mg by mouth at bedtime.    [provider]  oxyCODONE-acetaminophen (PERCOCET) 10-325 MG per tablet Take 1 tablet by mouth every 6 (six) hours as needed for pain.     [provider]  RABEprazole (ACIPHEX) 20 MG tablet Take 40 mg by mouth 2 (two) times daily before a meal.     [provider]  rosuvastatin (CRESTOR) 40 MG tablet Take 40 mg by mouth at bedtime.     [provider]  silver sulfADIAZINE (SILVADENE) 1 % cream  Apply 1 application topically daily.    [provider]  temazepam (RESTORIL) 7.5 MG capsule Take 1 capsule (7.5  mg total) by mouth at bedtime as needed for sleep. Patient taking differently: Take 7.5 mg by mouth at bedtime.  04/10/17   Sater, Nanine Means, MD  terbinafine (LAMISIL) 1 % cream Apply 1 application topically daily.     [provider]  triamcinolone cream (KENALOG) 0.1 % Apply 1 application topically daily as needed (rash).    [provider]  urea 10 % lotion Apply 1 application topically as needed for dry skin.    [provider]    Family History Family History  Problem Relation Age of Onset  . Heart disease Father   . Alzheimer's disease Mother   . Pneumonia Unknown   . Colon cancer Neg Hx   . Esophageal cancer Neg Hx   . Stomach cancer Neg Hx   . Rectal cancer Neg Hx     Social History Social History   Tobacco Use  . Smoking status: Never Smoker  . Smokeless tobacco: Never Used  Substance Use Topics  . Alcohol use: No  . Drug use: Yes    Types: Cocaine    Comment: stopped cocaine 2 and 1/2 years ago     Allergies   Tetanus toxoids   Review of Systems Review of Systems  Constitutional: Negative for fever.  Respiratory: Negative for shortness of breath.   Cardiovascular: Positive for chest pain.  Gastrointestinal: Positive for nausea. Negative for abdominal pain and vomiting.  Musculoskeletal: Negative for back pain.  Neurological: Positive for numbness. Negative for headaches.  All other systems reviewed and are negative.    Physical Exam Updated Vital Signs BP 120/61   Pulse (!) 55   Resp 14   SpO2 97%   Physical Exam  Constitutional: He is oriented to person, place, and time. He appears well-developed and well-nourished. He does not appear ill.  Overweight  HENT:  Head: Normocephalic and atraumatic.  Eyes: Pupils are equal, round, and reactive to light.  Cardiovascular: Normal rate, regular rhythm, normal heart sounds and normal pulses.  No murmur heard. Midline sternotomy scar  Pulmonary/Chest: Effort normal and breath  sounds normal. No respiratory distress. He has no wheezes.  Well-healed abdominal scars  Abdominal: Soft. Bowel sounds are normal. There is no tenderness. There is no rebound.  Musculoskeletal: He exhibits no edema.       Right lower leg: He exhibits no edema.       Left lower leg: He exhibits no edema.  Neurological: He is alert and oriented to person, place, and time.  Skin: Skin is warm and dry.  Psychiatric: He has a normal mood and affect.  Nursing note and vitals reviewed.    ED Treatments / Results  Labs (all labs ordered are listed, but only abnormal results are displayed) Labs Reviewed  BASIC METABOLIC PANEL - Abnormal; Notable for the following components:      Result Value   CO2 21 (*)    Glucose, Bld 134 (*)    Creatinine, Ser 1.79 (*)    GFR calc non Af Amer 36 (*)    GFR calc Af Amer 42 (*)    All other components within normal limits  HEPATIC FUNCTION PANEL - Abnormal; Notable for the following components:   Albumin 3.2 (*)    Alkaline Phosphatase 35 (*)    All other components within normal limits  CBC  LIPASE, BLOOD  I-STAT  TROPONIN, ED    EKG  EKG Interpretation  Date/Time:  Sunday July 28 2017 23:32:02 EST Ventricular Rate:  63 PR Interval:    QRS Duration: 144 QT Interval:  487 QTC Calculation: 499 R Axis:   -83 Text Interpretation:  Sinus rhythm Right bundle branch block Anterior infarct, old Confirmed by Thayer Jew [phone]) on 07/28/2017 11:40:08 PM       Radiology Dg Chest 2 View  Result Date: 07/29/2017 CLINICAL DATA:  Chest pain EXAM: CHEST  2 VIEW COMPARISON:  07/23/2017 FINDINGS: Post sternotomy changes. Streaky atelectasis or scarring at the bases. No focal consolidation or effusion. Stable cardiomediastinal silhouette. No pneumothorax. IMPRESSION: Streaky bibasilar atelectasis or scarring. No acute pulmonary infiltrate or edema. Electronically Signed   By: Donavan Foil M.D.   On: 07/29/2017 00:20    Procedures Procedures  (including critical care time)  Medications Ordered in ED Medications  LORazepam (ATIVAN) tablet 1 mg (1 mg Oral Given 07/29/17 0123)     Initial Impression / Assessment and Plan / ED Course  I have reviewed the triage vital signs and the nursing notes.  Pertinent labs & imaging results that were available during my care of the patient were reviewed by me and considered in my medical decision making (see chart for details).     Patient presents with chest pain consistent with prior episodes related to heart attack.  He is nontoxic appearing and vital signs are reassuring.  He is currently chest pain-free.  This is after nitroglycerin.  Story is highly suggestive of stable angina.  Lab work is largely at the patient's baseline.  Initial EKG and troponin are reassuring.  Given the high risk nature of the patient's pain and history, he will need to be admitted for chest pain rule out.    Final Clinical Impressions(s) / ED Diagnoses   Final diagnoses:  Stable angina [facility])    ED Discharge Orders    None       Ladonya Jerkins, Barbette Hair, MD 07/29/17 0230

## 2017-07-29 NOTE — Progress Notes (Addendum)
ANTICOAGULATION CONSULT NOTE - Initial Consult  Pharmacy Consult for Heparin (holding apixaban) Indication: atrial fibrillation  Allergies  Allergen Reactions  . Tetanus Toxoids Hives    Patient Measurements:  ~115 kg  Vital Signs: BP: 120/61 (02/18 0200) Pulse Rate: 55 (02/18 0200)  Labs: Recent Labs    07/28/17 2345  HGB 14.8  HCT 43.9  PLT 232  CREATININE 1.79*    CrCl cannot be calculated (Unknown ideal weight.).   Medical History: Past Medical History:  Diagnosis Date  . AAA (abdominal aortic aneurysm) ([place])    a. Aortic US 05/2012: 3.9 cm fusiform abdominal aortic aneurysm. F/u recommended 05/2014.  Marland Kitchen Anemia   . Arthritis    DDD  . Atrial fibrillation ([place])    a. Post-op CABG 05/2009. b. Re-identified 12/2013.   Marland Kitchen CAD (coronary artery disease)    a. s/p CABGx4 05/2009. 10/10/16 PCI with DES x1 SVG-->OM2/OM3  . Chronic constipation   . CKD (chronic kidney disease), stage III ([place])   . Cocaine abuse in remission ([place])   . Colon polyps   . Depression   . Diabetes mellitus   . Empyema lung ([place])    a. PNA 02/2011 c/b empyema requiring chest tube, drainage of pleural effusion and decortication.  . Enlarged prostate   . Frequent falls   . GERD (gastroesophageal reflux disease)   . Headache   . Hepatic steatosis   . Hyperlipidemia   . Hypertension   . Hypothyroidism   . Iron deficiency anemia   . LAFB (left anterior fascicular block)   . Lung cancer ([place])    a. Tx with interleukin in 2002 per patient.  . Myocardial infarction ([place])    10/08/16  . Nodule of left lung    a. Cavitary nodules 02/2011, f/u recommended.  . Obesity   . Paroxysmal supraventricular tachycardia ([place])    a. While inpatient 02/2011 sick with empyema - wide appearace due to RBBB.  . Pneumonia   . Post concussion syndrome   . RBBB   . Renal cell carcinoma    a. s/p R nephrectomy 2002. Reported pulmonary mets per note at that time.  . Scrotal abscess    a. 2012 2/2 Streptococcus  group C, status post incision and drainage.  . Sleep apnea    wears CPAP  . 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  . Vitamin D deficiency   . Wears dentures     Assessment: 72 y/o M on apixaban PTA for afib, here with chest pain, holding apixaban and starting heparin, I have ordered baseline aPTT and heparin levels and they have been collected, will likely need to use aPTT to dose heparin for now>>f/u results from baseline labs to confirm this.   Goal of Therapy:  Heparin level 0.3-0.7 units/ml aPTT 66-102 seconds Monitor platelets by anticoagulation protocol: Yes   Plan:  Start heparin drip at 1250 units/hr (based on previous heparin dosing) 1400 aPTT/HL Daily CBC/HL/aPTT Monitor for bleeding   Narda Bonds 07/29/2017,5:23 AM   ================ Addendum 6:09 AM Baseline anti-Xa level is <0.1 so we can use heparin level to dose  -Will DC aPTT's Narda Bonds, PharmD, BCPS Clinical Pharmacist Phone: [phone] =================

## 2017-07-29 NOTE — Discharge Summary (Signed)
Discharge Summary  Matthew [name]:[id number] DOB: 1946-04-20  PCP: Marda Stalker, PA-C  Admit date: 07/28/2017 Discharge date: 07/29/2017  Time spent: 25 minutes   Recommendations for Outpatient Follow-up:  1. Medication change: Imdur increased from 30 mg to 60 mg (to be taken only if blood pressure greater than 811 systolic) 2. Patient will follow-up with his cardiologist at the [facility]   Discharge Diagnoses:  Active Hospital Problems   Diagnosis Date Noted  . Chest pain 12/20/2013  . Type 2 diabetes mellitus with vascular disease ([place]) 07/29/2017  . PAF (paroxysmal atrial fibrillation) ([place]) 12/21/2013  . CKD (chronic kidney disease), stage III ([place]) 12/21/2013  . AAA (abdominal aortic aneurysm) ([place])   . Mixed hyperlipidemia 10/14/2013  . Hypertension     Resolved Hospital Problems  No resolved problems to display.    Discharge Condition: Improved, being discharged home   Diet recommendation: Heart healthy   Vitals:   07/29/17 1300 07/29/17 1400  BP: 102/85 (!) 102/50  Pulse: (!) 102 65  Resp: (!) 27 19  SpO2: 96% 94%    History of present illness:  72 year old male with past medical history of CAD status post stenting in May 2018 along with diabetes, atrial fibrillation, chronic kidney disease presented to the emergency room on the early morning of 2/18 with complaints of retrosternal chest pressure which it started the night before at 11 PM. Initial enzymes were negative. Chest pain resolved by time patient was in the emergency room, but he had taken 2 nitroglycerin at home. Initial EKG noted normal sinus rhythm with right bundle branch block. Patient was brought in for further evaluation. Cardiology was consulted.   Hospital Course:  Principal Problem:   Chest pain: Seen by cardiology. ENZYMES negative. No events by telemetry. Acute MI ruled out. Cardiology felt chest pain was concerning for angina. Symptoms were similar to prior angina when he required  stenting. The symptoms resolved after he took nitroglycerin at home. Cardiology recommended once MI ruled out the patient's stay and heart catheterization which patient declined. He overall appeared to be stable. Was recommended by cardiology to increase his Imdur from 30 mg to 60 mg as he can tolerate (blood pressure can get low at times) and then follow-up with his cardiologist at the [facility]. Active Problems:   Hypertension: Change as above. Stable. Blood pressure low at times.   Mixed hyperlipidemia   PAF (paroxysmal atrial fibrillation) ([place]): Rate controlled, continue anticoagulation. Chads 2 score of 4.   CKD (chronic kidney disease), stage III ([place]): At baseline.   AAA (abdominal aortic aneurysm) ([facility])   Type 2 diabetes mellitus with vascular disease ([place]): Management sliding scale, continue home medications upon discharge Obstructive sleep apnea: Noncompliant with CPAP   Procedures:  None   Consultations:  Cardiology   Discharge Exam: BP (!) 102/50   Pulse 65   Resp 19   SpO2 94%   General: Alert and oriented 3, no acute distress  Cardiovascular: Irregular rhythm, rate controlled  Respiratory: Clear to auscultation bilaterally   Discharge Instructions You were cared for by a hospitalist during your hospital stay. If you have any questions about your discharge medications or the care you received while you were in the hospital after you are discharged, you can call the unit and asked to speak with the hospitalist on call if the hospitalist that took care of you is not available. Once you are discharged, your primary care physician will handle any further medical issues. Please note that NO  REFILLS for any discharge medications will be authorized once you are discharged, as it is imperative that you return to your primary care physician (or establish a relationship with a primary care physician if you do not have one) for your aftercare needs so that they can reassess your need for  medications and monitor your lab values.  Discharge Instructions    Diet - low sodium heart healthy   Complete by:  As directed    Increase activity slowly   Complete by:  As directed      Allergies as of 07/29/2017      Reactions   Tetanus Toxoids Hives      Medication List    TAKE these medications   amiodarone 200 MG tablet Commonly known as:  PACERONE Take 0.5 tablets (100 mg total) by mouth 2 (two) times daily.   buPROPion 150 MG 24 hr tablet Commonly known as:  WELLBUTRIN XL Take 150 mg by mouth daily.   capsaicin 0.025 % cream Commonly known as:  ZOSTRIX Apply 1 application topically 2 (two) times daily as needed (for pain).   cholecalciferol 1000 units tablet Commonly known as:  VITAMIN D Take 1,000 Units by mouth daily.   clopidogrel 75 MG tablet Commonly known as:  PLAVIX Take 1 tablet (75 mg total) by mouth daily with breakfast.   clotrimazole 1 % cream Commonly known as:  LOTRIMIN Apply 1 application topically 2 (two) times daily as needed (for rash).   ELIQUIS 5 MG Tabs tablet Generic drug:  apixaban Take 5 mg by mouth 2 (two) times daily.   ferrous sulfate 325 (65 FE) MG tablet Take 325 mg by mouth 3 (three) times a week. Tuesday, Wednesday and Friday   finasteride 5 MG tablet Commonly known as:  PROSCAR Take 5 mg by mouth daily.   Fish Oil 1000 MG Caps Take 2,000 mg by mouth 2 (two) times daily.   gabapentin 300 MG capsule Commonly known as:  NEURONTIN Take 300 mg by mouth 2 (two) times daily.   glucose 4 GM chewable tablet Chew 1 tablet by mouth as needed for low blood sugar.   insulin aspart 100 UNIT/ML injection Commonly known as:  novoLOG Inject 25 Units into the skin 2 (two) times daily as needed for high blood sugar. 25 units before lunch and 25 units before dinner   isosorbide mononitrate 60 MG 24 hr tablet Commonly known as:  IMDUR Take 1 tablet (60 mg total) by mouth daily. Hold if blood pressure <100 Start taking on:   07/30/2017 What changed:    medication strength  how much to take  additional instructions   ketoconazole 2 % cream Commonly known as:  NIZORAL Apply 1 application topically daily as needed. For rash   LEVEMIR FLEXPEN 100 UNIT/ML Pen Generic drug:  Insulin Detemir Inject 50 Units into the skin 2 (two) times daily.   levothyroxine 150 MCG tablet Commonly known as:  SYNTHROID, LEVOTHROID Take 150 mcg by mouth daily before breakfast.   LIDOCAINE EX Apply 1 application topically 2 (two) times daily as needed (for pain).   LINZESS 290 MCG Caps capsule Generic drug:  linaclotide Take 290 mcg by mouth daily as needed (constipation).   Magnesium Oxide 420 MG Tabs Take 420 mg by mouth daily.   MENTHOL-METHYL SALICYLATE EX Apply 1 application topically as needed (pain).   mesalamine 1000 MG suppository Commonly known as:  CANASA Place 1,000 mg rectally at bedtime as needed (ulcerative colitis flares).   mesalamine  1.2 g EC tablet Commonly known as:  LIALDA Take 1.2 g by mouth daily as needed (ulcerative colitis flares).   methylPREDNISolone 4 MG tablet Commonly known as:  MEDROL Take 4 mg by mouth 2 (two) times daily as needed (gout flares).   metoprolol succinate 25 MG 24 hr tablet Commonly known as:  TOPROL-XL Take 12.5 mg by mouth daily.   mupirocin ointment 2 % Commonly known as:  BACTROBAN Place 1 application into the nose daily as needed (for mersa).   neomycin-bacitracin-polymyxin ointment Commonly known as:  NEOSPORIN Apply 1 application topically as needed for wound care.   nitroGLYCERIN 0.4 MG SL tablet Commonly known as:  NITROSTAT Place 0.4 mg under the tongue every 5 (five) minutes as needed for chest pain (x 3 doses daily).   nortriptyline 25 MG capsule Commonly known as:  PAMELOR Take 1 capsule (25 mg total) by mouth at bedtime.   nystatin cream Commonly known as:  MYCOSTATIN Apply 1 application topically daily as needed for dry skin (rash).     ondansetron 8 MG disintegrating tablet Commonly known as:  ZOFRAN-ODT Take 8 mg by mouth 2 (two) times daily as needed for nausea or vomiting.   oxyCODONE-acetaminophen 10-325 MG tablet Commonly known as:  PERCOCET Take 1 tablet by mouth every 6 (six) hours as needed for pain.   RABEprazole 20 MG tablet Commonly known as:  ACIPHEX Take 40 mg by mouth 2 (two) times daily before a meal.   rosuvastatin 40 MG tablet Commonly known as:  CRESTOR Take 40 mg by mouth at bedtime.   silver sulfADIAZINE 1 % cream Commonly known as:  SILVADENE Apply 1 application topically daily as needed (skin care).   temazepam 7.5 MG capsule Commonly known as:  RESTORIL Take 1 capsule (7.5 mg total) by mouth at bedtime as needed for sleep. What changed:  when to take this   terbinafine 1 % cream Commonly known as:  LAMISIL Apply 1 application topically daily as needed (skin care).   triamcinolone cream 0.1 % Commonly known as:  KENALOG Apply 1 application topically daily as needed (rash).   urea 10 % lotion Apply 1 application topically as needed for dry skin.      Allergies  Allergen Reactions  . Tetanus Toxoids Hives   Follow-up Information    Marda Stalker, [place] .   Specialty:  Family Medicine Contact information: [place] [id number] [phone]            The results of significant diagnostics from this hospitalization (including imaging, microbiology, ancillary and laboratory) are listed below for reference.    Significant Diagnostic Studies: Dg Chest 2 View  Result Date: 07/29/2017 CLINICAL DATA:  Chest pain EXAM: CHEST  2 VIEW COMPARISON:  07/23/2017 FINDINGS: Post sternotomy changes. Streaky atelectasis or scarring at the bases. No focal consolidation or effusion. Stable cardiomediastinal silhouette. No pneumothorax. IMPRESSION: Streaky bibasilar atelectasis or scarring. No acute pulmonary infiltrate or edema. Electronically Signed   By: Donavan Foil M.D.   On: 07/29/2017 00:20    Microbiology: No results found for this or any previous visit (from the past 240 hour(s)).   Labs: Basic Metabolic Panel: Recent Labs  Lab 07/28/17 2345  NA 135  K 4.8  CL 102  CO2 21*  GLUCOSE 134*  BUN 18  CREATININE 1.79*  CALCIUM 9.2   Liver Function Tests: Recent Labs  Lab 07/28/17 2345  AST 35  ALT 34  ALKPHOS 35*  BILITOT 0.8  PROT 6.8  ALBUMIN 3.2*   Recent Labs  Lab 07/28/17 2345  LIPASE 22   No results for input(s): AMMONIA in the last 168 hours. CBC: Recent Labs  Lab 07/28/17 2345  WBC 8.4  HGB 14.8  HCT 43.9  MCV 97.6  PLT 232   Cardiac Enzymes: Recent Labs  Lab 07/29/17 0510 07/29/17 0900  TROPONINI <0.03 <0.03   BNP: BNP (last 3 results) No results for input(s): BNP in the last 8760 hours.  ProBNP (last 3 results) No results for input(s): PROBNP in the last 8760 hours.  CBG: Recent Labs  Lab 07/29/17 0417 07/29/17 0742 07/29/17 1145  GLUCAP 299* 207* 150*       Signed:  Annita Brod, MD [facility] 07/29/2017, 2:09 PM

## 2017-07-29 NOTE — Consult Note (Signed)
Cardiology Consultation:   Patient ID: [name]; [id number]; 08-05-1945   Admit date: 07/28/2017 Date of Consult: 07/29/2017  Primary Care Provider: Marda Stalker, PA-C Primary Cardiologist: Dr. Irish Lack and Ackerly @ [name]    Patient Profile:   Matthew [name] is a 72 y.o. male with a hx of CAD s/p CABG , AAA and carotid artery disease (both followed at the [place]), CKD stage III, PAF on eliquis and amiodarone, HTN, HLD and DM who is being seen today for the evaluation of Chest pain at the request of Dr. Maryland Pink.   Hx of CAD s/p CABG in 2010.  NSTEMI with [facility] 10/10/16 showing 95% occluded SVG>>OM1.OM2, s/p PCI + DES. Last seen for hospital follow up 10/17/16. Since then he is following with cardiologist at [facility].   History of Present Illness:   Matthew [name] presented with substernal chest tightness yesterday 11 PM while watching basketball game.  Associated with shortness of breath.  No radiation, diaphoresis or nausea.  Symptoms similar to prior angina but less intense.  His symptoms improved after sublingual nitroglycerin x 2.  However, he did not felt well afterwards when he went to use bathroom.  Called EMS and brought to ER for further evaluation.  This is the first time he required any sublingual nitroglycerin after his stenting last year.  Upon arrival to ER, his pain was resolved.  Patient is admitted for observation and rule out.  Enzymes has been negative.  Electrolytes normal.  Serum creatinine 1.7.  Chest x-ray without acute abnormality.  EKG shows sinus rhythm at rate of 62 bpm, chronic right bundle branch block.  No acute abnormality found-personally reviewed.  Last dose of Eliquis Saturday evening.  He did not took Eliquis yesterday morning and evening for unknown reason.  He says he usually compliant with medication but did not felt to need take Eliquis yesterday.  He walks around the house without any issue.  No regular exercise due to chronic back problems.  Denies  orthopnea, PND, syncope, lower extremity edema, melena, palpitation or dizziness.  Past Medical History:  Diagnosis Date  . AAA (abdominal aortic aneurysm) ([place])    a. Aortic US 05/2012: 3.9 cm fusiform abdominal aortic aneurysm. F/u recommended 05/2014.  Marland Kitchen Anemia   . Arthritis    DDD  . Atrial fibrillation ([place])    a. Post-op CABG 05/2009. b. Re-identified 12/2013.   Marland Kitchen CAD (coronary artery disease)    a. s/p CABGx4 05/2009. 10/10/16 PCI with DES x1 SVG-->OM2/OM3  . Chronic constipation   . CKD (chronic kidney disease), stage III ([place])   . Cocaine abuse in remission ([place])   . Colon polyps   . Depression   . Diabetes mellitus   . Empyema lung ([place])    a. PNA 02/2011 c/b empyema requiring chest tube, drainage of pleural effusion and decortication.  . Enlarged prostate   . Frequent falls   . GERD (gastroesophageal reflux disease)   . Headache   . Hepatic steatosis   . Hyperlipidemia   . Hypertension   . Hypothyroidism   . Iron deficiency anemia   . LAFB (left anterior fascicular block)   . Lung cancer ([place])    a. Tx with interleukin in 2002 per patient.  . Myocardial infarction ([place])    10/08/16  . Nodule of left lung    a. Cavitary nodules 02/2011, f/u recommended.  . Obesity   . Paroxysmal supraventricular tachycardia ([place])    a. While inpatient 02/2011 sick with empyema -  wide appearace due to RBBB.  . Pneumonia   . Post concussion syndrome   . RBBB   . Renal cell carcinoma    a. s/p R nephrectomy 2002. Reported pulmonary mets per note at that time.  . Scrotal abscess    a. 2012 2/2 Streptococcus group C, status post incision and drainage.  . Sleep apnea    wears CPAP  . 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 Remote dx dating back to 1979.  . Vitamin D deficiency   . Wears dentures     Past Surgical History:  Procedure Laterality Date  . Bronchoscopy, right video-assisted thoracoscopy, minithoracotomy, drainage of pleural effusion, and decortication.  02/26/2011    [facility]  . CABG x 4  05/13/2009   [facility]  . CARDIAC SURGERY    . COLONOSCOPY W/ BIOPSIES AND POLYPECTOMY    . CORONARY STENT INTERVENTION N/A 10/10/2016   Procedure: Coronary Stent Intervention;  Surgeon: Troy Sine, MD;  Location: [place] [facility];  Service: Cardiovascular;  Laterality: N/A;  . HEMORRHOID SURGERY    . HERNIA REPAIR    . Incision, drainage and debridement of perineum and  scrotum    . Irrigation and debridement of scrotal wound and closure of scrotal wound.    Marland Kitchen LEFT HEART CATH AND CORS/GRAFTS ANGIOGRAPHY N/A 10/10/2016   Procedure: Left Heart Cath and Cors/Grafts Angiography;  Surgeon: Troy Sine, MD;  Location: [place] [facility];  Service: Cardiovascular;  Laterality: N/A;  . MULTIPLE TOOTH EXTRACTIONS    . RADIOLOGY WITH ANESTHESIA N/A 05/09/2017   Procedure: MRI WITH ANESTHESIA   BRAIN WITHOUT CONTRAST;  Surgeon: Radiologist, Medication, MD;  Location: [place];  Service: Radiology;  Laterality: N/A;  . Right radical nephrectomy.    . TOOTH EXTRACTION       Inpatient Medications: Scheduled Meds: . amiodarone  100 mg Oral BID  . buPROPion  150 mg Oral Daily  . clopidogrel  75 mg Oral Q breakfast  . [START ON 07/30/2017] ferrous sulfate  325 mg Oral Once per day on Tue Wed Fri  . finasteride  5 mg Oral Daily  . gabapentin  300 mg Oral BID  . insulin aspart  0-9 Units Subcutaneous Q4H  . insulin detemir  5 Units Subcutaneous BID  . isosorbide mononitrate  30 mg Oral Daily  . levothyroxine  150 mcg Oral QAC breakfast  . magnesium oxide  400 mg Oral Daily  . metoprolol succinate  12.5 mg Oral Daily  . nortriptyline  25 mg Oral QHS  . omega-3 acid ethyl esters  2,000 mg Oral BID  . oxybutynin  10 mg Oral QHS  . pantoprazole  40 mg Oral Daily  . rosuvastatin  40 mg Oral QHS  . temazepam  7.5 mg Oral QHS   Continuous Infusions: . heparin 1,250 Units/hr (07/29/17 0601)   PRN Meds: acetaminophen, mesalamine, mesalamine, nitroGLYCERIN, ondansetron  (ZOFRAN) IV, oxyCODONE-acetaminophen **AND** oxyCODONE  Allergies:    Allergies  Allergen Reactions  . Tetanus Toxoids Hives    Social History:   Social History   Socioeconomic History  . Marital status: Married    Spouse name: Not on file  . Number of children: 4  . Years of education: Not on file  . Highest education level: Not on file  Social Needs  . Financial resource strain: Not on file  . Food insecurity - worry: Not on file  . Food insecurity - inability: Not on file  . Transportation needs -  medical: Not on file  . Transportation needs - non-medical: Not on file  Occupational History  . Occupation: CAR SERVICE    Employer: SELF EMPLOYED  Tobacco Use  . Smoking status: Never Smoker  . Smokeless tobacco: Never Used  Substance and Sexual Activity  . Alcohol use: No  . Drug use: Yes    Types: Cocaine    Comment: stopped cocaine 2 and 1/2 years ago  . Sexual activity: Not on file  Other Topics Concern  . Not on file  Social History Narrative  . Not on file    Family History:    Family History  Problem Relation Age of Onset  . Heart disease Father   . Alzheimer's disease Mother   . Pneumonia Unknown   . Colon cancer Neg Hx   . Esophageal cancer Neg Hx   . Stomach cancer Neg Hx   . Rectal cancer Neg Hx      ROS:  Please see the history of present illness.  All other ROS reviewed and negative.     Physical Exam/Data:   Vitals:   07/29/17 1000 07/29/17 1045 07/29/17 1115 07/29/17 1200  BP:    (!) 106/53  Pulse: (!) 57 65 65 65  Resp: 15 (!) 21 12 15   SpO2: 92% 94% 97% 96%   No intake or output data in the 24 hours ending 07/29/17 1301 There were no vitals filed for this visit. There is no height or weight on file to calculate BMI.  General:  Well nourished, well developed, in no acute distress HEENT: normal Lymph: no adenopathy Neck: no JVD Endocrine:  No thryomegaly Vascular: No carotid bruits; FA pulses 2+ bilaterally without bruits    Cardiac:  normal S1, S2; RRR; no murmur  Lungs:  clear to auscultation bilaterally, no wheezing, rhonchi or rales  Abd: soft, nontender, no hepatomegaly  Ext: no edema Musculoskeletal:  No deformities, BUE and BLE strength normal and equal Skin: warm and dry  Neuro:  CNs 2-12 intact, no focal abnormalities noted Psych:  Normal affect    Telemetry:  Telemetry was personally reviewed and demonstrates:  NSR  Relevant CV Studies:  Coronary Stent Intervention  Left Heart Cath and Cors/Grafts Angiography  Conclusion     Prox LAD to Mid LAD lesion, 100 %stenosed.  Prox RCA to Mid RCA lesion, 100 %stenosed.  Mid Cx lesion, 95 %stenosed.  Ost 2nd Mrg to 2nd Mrg lesion, 100 %stenosed.  Dist Cx lesion, 100 %stenosed.  LIMA and is normal in caliber and anatomically normal.  SVG and is normal in caliber and anatomically normal.  Seq SVG- OM2-OM3 and is normal in caliber.  Dist Graft-2 lesion, 50 %stenosed.  SVG.  Origin lesion, 100 %stenosed.  A STENT SYNERGY DES 3X28 drug eluting stent was successfully placed.  Dist Graft-1 lesion, 95 %stenosed.  Post intervention, there is a 0% residual stenosis.  Prox Graft to Mid Graft lesion, 25 %stenosed.   Severe native CAD with total occlusion of the proximal LAD at a site of prior proximal stenting after a small diagonal vessel; subtotal occlusion of the circumflex vessel prior to the OM2, with occlusion of the ostium of the OM1, and diffuse 95% mid AV groove stenosis with total distal occlusion before the distal marginal branch; and total proximal RCA occlusion.  Patent LIMA graft which supplies the mid LAD and 2 diagonal vessels.  The sequential vein graft which supplies a large OM 2 - OM3  has mild diffuse 20-30% stenosis proximally  in the graft and in the mid body of the graft there is a sequential stenosis of 95%, followed by eccentric 50% narrowing with TIMI-3 flow down both marginal branches.  Ostially occluded vein  graft which has supplied a diagonal vessel.  Patent SVG to the distal RCA.  Successful PCI to the sequential SVG supplying the marginal vessels with ultimate insertion of a 3.028 mm Synergy DES stent to cover both lesions, postdilated to 3.18 mm with the 95 and 50% stenoses being reduced to 0%.  There is brisk TIMI-3 flow down to both marginal branches.  RECOMMENDATION: The patient has a history of atrial fibrillation but is in sinus rhythm today.  Eliquis will most likely be reinstituted and the patient will be on short-term triple drug therapy with ultimate plans for Plavix/eliquis.    Diagnostic Diagram       Post-Intervention Diagram          Laboratory Data:  Chemistry Recent Labs  Lab 07/28/17 2345  NA 135  K 4.8  CL 102  CO2 21*  GLUCOSE 134*  BUN 18  CREATININE 1.79*  CALCIUM 9.2  GFRNONAA 36*  GFRAA 42*  ANIONGAP 12    Recent Labs  Lab 07/28/17 2345  PROT 6.8  ALBUMIN 3.2*  AST 35  ALT 34  ALKPHOS 35*  BILITOT 0.8   Hematology Recent Labs  Lab 07/28/17 2345  WBC 8.4  RBC 4.50  HGB 14.8  HCT 43.9  MCV 97.6  MCH 32.9  MCHC 33.7  RDW 13.2  PLT 232   Cardiac Enzymes Recent Labs  Lab 07/29/17 0510 07/29/17 0900  TROPONINI <0.03 <0.03    Recent Labs  Lab 07/28/17 2350  TROPIPOC 0.00    BNPNo results for input(s): BNP, PROBNP in the last 168 hours.  DDimer No results for input(s): DDIMER in the last 168 hours.  Radiology/Studies:  Dg Chest 2 View  Result Date: 07/29/2017 CLINICAL DATA:  Chest pain EXAM: CHEST  2 VIEW COMPARISON:  07/23/2017 FINDINGS: Post sternotomy changes. Streaky atelectasis or scarring at the bases. No focal consolidation or effusion. Stable cardiomediastinal silhouette. No pneumothorax. IMPRESSION: Streaky bibasilar atelectasis or scarring. No acute pulmonary infiltrate or edema. Electronically Signed   By: Donavan Foil M.D.   On: 07/29/2017 00:20    Assessment and Plan:   1. Chest pain -Concerning  for angina.  His symptoms similar to prior angina when he required stenting but  less intense. -Symptoms resolved after sublingual nitroglycerin x 2.  This is the first time he required nitro after his PCI last year. -Patient has ruled out.  EKG without acute ischemic changes.  Troponin has been negative.  Patient does not want to proceed with any cardiac evaluation. -Continue Plavix.  No aspirin given need of anticoagulation.  He was NPO. Offered cath but he does not wants to do it. Increase Imdur to 60 mg daily. -Continue Crestor and Toprol-XL at current dose (HR and BP soft to increase further).   2.  CAD s/p CABG in 2010 - Last [facility] 10/10/16 showing 95% occluded SVG>>OM1.OM2, s/p PCI + DES.  -Continue Plavix, statin and beta-blocker.  3.  Paroxysmal atrial fibrillation -Maintaining sinus rhythm.  Continue amiodarone and Eliquis (encouraged daily compliance). Here on heparin. CHADSVASc score of 4.   4.  Hyperlipidemia -Continue statin.  5. CKD stage III - Scr at baseline  6. OSA - non compliant with CPAP.  [facility]  He should follow up with  his cardiologist at [facility] in 2 weeks.                                                                                                                                                                                      For questions or updates, please contact [place] Please consult [email or web address] for contact info under Cardiology/STEMI.   [place], [place]  07/29/2017 1:01 PM   Attending Note:   The patient was seen and examined.  Agree with assessment and plan as noted above.  Changes made to the above note as needed.  Patient seen and independently examined with Robbie Lis, PA .   We discussed all aspects of the encounter. I agree with the assessment and plan as stated above.  1.  Chest pain: Patient has a history of coronary artery disease.  He is status post coronary artery bypass grafting  and status post stenting in May 2018.  He had an episode of chest discomfort while watching the ball game yesterday.  He took several nitroglycerin with relief.  He has has ruled out for myocardial infarction with negative troponin levels.  We have recommended that he stay and have heart catheterization.  He does not want to have a heart catheterization.  He seems to be stable.  At this point I think that we can ambulate him in the halls.  If he remains stable think he can be discharged safely at home.  He will need to call his doctors at the [facility] and arrange for follow-up.  His blood pressure and heart rate are fairly low.  We really do not have room to add any further medications at this time.  Fortunately, he feels very well.  He should get a refill of his nitroglycerin to take as needed.  I have spent a total of 40 minutes with patient reviewing hospital  notes , telemetry, EKGs, labs and examining patient as well as establishing an assessment and plan that was discussed with the patient. > 50% of time was spent in direct patient care.    Thayer Headings, Brooke Bonito., MD, [facility] 07/29/2017, 1:40 PM [address]  [place] Pager [phone]

## 2017-07-29 NOTE — H&P (Addendum)
History and Physical    Matthew [name] IRJ:[id number] DOB: 12-01-1945 DOA: 07/28/2017  PCP: Marda Stalker, PA-C  Patient coming from: Home.  Chief Complaint: Pain.  HPI: Matthew [name] is a 72 y.o. male with history of CAD status post stenting, last May 2018, atrial fibrillation, diabetes mellitus type 2, hypertension, chronic kidney disease, sleep apnea, hypothyroidism, morbid obesity presents to the ER with complaints of chest pain.  Patient started developing chest pain at home while watching TV last night around 11 PM.  Pain was retrosternal pressure-like nonradiating no shortness of breath no cough or fever chills.  No diaphoresis.  Patient took 2 sublingual nitroglycerin 5 minutes apart after patient started developing chest pain.  EMS was called and patient was brought to the ER.  ED Course: After patient reached ER patient chest pain resolved.  EKG shows normal sinus rhythm with RBBB.  Troponins were negative.  Chest x-ray was unremarkable.  Given the history of CAD patient is being admitted for further observation to rule out ACS.  At the time of my exam patient is chest pain-free.  Review of Systems: As per HPI, rest all negative.   Past Medical History:  Diagnosis Date  . AAA (abdominal aortic aneurysm) ([facility])    a. Aortic US 05/2012: 3.9 cm fusiform abdominal aortic aneurysm. F/u recommended 05/2014.  Marland Kitchen Anemia   . Arthritis    DDD  . Atrial fibrillation ([place])    a. Post-op CABG 05/2009. b. Re-identified 12/2013.   Marland Kitchen CAD (coronary artery disease)    a. s/p CABGx4 05/2009. 10/10/16 PCI with DES x1 SVG-->OM2/OM3  . Chronic constipation   . CKD (chronic kidney disease), stage III ([place])   . Cocaine abuse in remission ([place])   . Colon polyps   . Depression   . Diabetes mellitus   . Empyema lung ([place])    a. PNA 02/2011 c/b empyema requiring chest tube, drainage of pleural effusion and decortication.  . Enlarged prostate   . Frequent falls   . GERD (gastroesophageal  reflux disease)   . Headache   . Hepatic steatosis   . Hyperlipidemia   . Hypertension   . Hypothyroidism   . Iron deficiency anemia   . LAFB (left anterior fascicular block)   . Lung cancer ([place])    a. Tx with interleukin in 2002 per patient.  . Myocardial infarction ([place])    10/08/16  . Nodule of left lung    a. Cavitary nodules 02/2011, f/u recommended.  . Obesity   . Paroxysmal supraventricular tachycardia ([place])    a. While inpatient 02/2011 sick with empyema - wide appearace due to RBBB.  . Pneumonia   . Post concussion syndrome   . RBBB   . Renal cell carcinoma    a. s/p R nephrectomy 2002. Reported pulmonary mets per note at that time.  . Scrotal abscess    a. 2012 2/2 Streptococcus group C, status post incision and drainage.  . Sleep apnea    wears CPAP  . Ulcerative proctitis ([place])    a. Remote dx dating back to 1979.  . Vitamin D deficiency   . Wears dentures     Past Surgical History:  Procedure Laterality Date  . Bronchoscopy, right video-assisted thoracoscopy, minithoracotomy, drainage of pleural effusion, and decortication.  02/26/2011   [facility]  . CABG x 4  05/13/2009   [facility]  . CARDIAC SURGERY    . COLONOSCOPY W/ BIOPSIES AND POLYPECTOMY    . CORONARY STENT INTERVENTION  N/A 10/10/2016   Procedure: Coronary Stent Intervention;  Surgeon: Troy Sine, MD;  Location: [facility];  Service: Cardiovascular;  Laterality: N/A;  . HEMORRHOID SURGERY    . HERNIA REPAIR    . Incision, drainage and debridement of perineum and  scrotum    . Irrigation and debridement of scrotal wound and closure of scrotal wound.    Marland Kitchen LEFT HEART CATH AND CORS/GRAFTS ANGIOGRAPHY N/A 10/10/2016   Procedure: Left Heart Cath and Cors/Grafts Angiography;  Surgeon: Troy Sine, MD;  Location: [facility];  Service: Cardiovascular;  Laterality: N/A;  . MULTIPLE TOOTH EXTRACTIONS    . RADIOLOGY WITH ANESTHESIA N/A 05/09/2017   Procedure: MRI WITH ANESTHESIA   BRAIN  WITHOUT CONTRAST;  Surgeon: Radiologist, Medication, MD;  Location: [facility];  Service: Radiology;  Laterality: N/A;  . Right radical nephrectomy.    . TOOTH EXTRACTION       reports that  has never smoked. he has never used smokeless tobacco. He reports that he uses drugs. Drug: Cocaine. He reports that he does not drink alcohol.  Allergies  Allergen Reactions  . Tetanus Toxoids Hives    Family History  Problem Relation Age of Onset  . Heart disease Father   . Alzheimer's disease Mother   . Pneumonia Unknown   . Colon cancer Neg Hx   . Esophageal cancer Neg Hx   . Stomach cancer Neg Hx   . Rectal cancer Neg Hx     Prior to Admission medications   Medication Sig Start Date End Date Taking? Authorizing Provider  amiodarone (PACERONE) 200 MG tablet Take 1 tablet (200 mg total) by mouth daily. Patient taking differently: Take 100 mg by mouth 2 (two) times daily.  02/21/16   Lyda Jester M, PA-C  apixaban (ELIQUIS) 5 MG TABS tablet Take 5 mg by mouth 2 (two) times daily.    [provider]  buPROPion (WELLBUTRIN XL) 150 MG 24 hr tablet Take 150 mg by mouth daily.    [provider]  capsaicin (ZOSTRIX) 0.025 % cream Apply 1 application topically 2 (two) times daily as needed (for pain).     [provider]  cephALEXin (KEFLEX) 500 MG capsule Take 500 mg by mouth 2 (two) times daily. 04/24/17   [provider]  cholecalciferol (VITAMIN D) 1000 units tablet Take 1,000 Units by mouth daily.    [provider]  clopidogrel (PLAVIX) 75 MG tablet Take 1 tablet (75 mg total) by mouth daily with breakfast. 10/12/16   Reino Bellis B, NP  clotrimazole (LOTRIMIN) 1 % cream Apply 1 application topically 2 (two) times daily as needed (for rash).     [provider]  ferrous sulfate 325 (65 FE) MG tablet Take 325 mg by mouth every Monday, Wednesday, and Friday.    [provider]  finasteride (PROSCAR) 5 MG tablet Take 5 mg by mouth  daily.    [provider]  gabapentin (NEURONTIN) 300 MG capsule Take 300 mg by mouth 2 (two) times daily.     [provider]  glucose 4 GM chewable tablet Chew 1 tablet by mouth as needed for low blood sugar.    [provider]  insulin aspart (NOVOLOG) 100 UNIT/ML injection Inject 10-20 Units into the skin 3 (three) times daily before meals. Inject 10 units before breakfast and 20 units before lunch and 20 units before dinner    [provider]  Insulin Detemir (LEVEMIR FLEXPEN) 100 UNIT/ML  Pen Inject 40-60 Units into the skin 2 (two) times daily.     [provider]  isosorbide mononitrate (IMDUR) 30 MG 24 hr tablet Take 30 mg by mouth daily. Hold if SB is under 100    [provider]  ketoconazole (NIZORAL) 2 % cream Apply 1 application topically daily as needed. For rash     [provider]  levothyroxine (SYNTHROID, LEVOTHROID) 150 MCG tablet Take 150 mcg by mouth daily before breakfast.    [provider]  LIDOCAINE EX Apply 1 application topically 2 (two) times daily as needed (for pain).     [provider]  linaclotide (LINZESS) 290 MCG CAPS capsule Take 290 mcg by mouth daily.    [provider]  Magnesium Oxide 420 MG TABS Take 420 mg by mouth daily.     [provider]  MENTHOL-METHYL SALICYLATE EX Apply 1 application topically as needed (pain).    [provider]  mesalamine (CANASA) 1000 MG suppository Place 1,000 mg rectally at bedtime as needed (ulcerative colitis flares).     [provider]  mesalamine (LIALDA) 1.2 g EC tablet Take 1.2 g by mouth daily as needed (ulcerative colitis flares).    [provider]  methylPREDNISolone (MEDROL) 4 MG tablet Take 4 mg by mouth 2 (two) times daily as needed (gout flares).     [provider]  metoprolol succinate (TOPROL-XL) 25 MG 24 hr tablet Take 12.5 mg by mouth daily.    [provider]    mupirocin ointment (BACTROBAN) 2 % Place 1 application into the nose daily as needed (for mersa).     [provider]  neomycin-bacitracin-polymyxin (NEOSPORIN) ointment Apply 1 application topically as needed for wound care.    [provider]  nitroGLYCERIN (NITROSTAT) 0.4 MG SL tablet Place 0.4 mg under the tongue every 5 (five) minutes as needed for chest pain (x 3 doses daily).     [provider]  nortriptyline (PAMELOR) 25 MG capsule Take 1 capsule (25 mg total) by mouth at bedtime. 04/10/17   Sater, Nanine Means, MD  nystatin cream (MYCOSTATIN) Apply 1 application topically daily as needed for dry skin (rash).    [provider]  Omega-3 Fatty Acids (FISH OIL) 1000 MG CAPS Take 2,000 mg by mouth 2 (two) times daily.     [provider]  ondansetron (ZOFRAN-ODT) 8 MG disintegrating tablet Take 8 mg by mouth 2 (two) times daily.     [provider]  oxybutynin (DITROPAN-XL) 10 MG 24 hr tablet Take 10 mg by mouth at bedtime.    [provider]  oxyCODONE-acetaminophen (PERCOCET) 10-325 MG per tablet Take 1 tablet by mouth every 6 (six) hours as needed for pain.     [provider]  RABEprazole (ACIPHEX) 20 MG tablet Take 40 mg by mouth 2 (two) times daily before a meal.     [provider]  rosuvastatin (CRESTOR) 40 MG tablet Take 40 mg by mouth at bedtime.     [provider]  silver sulfADIAZINE (SILVADENE) 1 % cream Apply 1 application topically daily.    [provider]  temazepam (RESTORIL) 7.5 MG capsule Take 1 capsule (7.5 mg total) by mouth at bedtime as needed for sleep. Patient taking differently: Take 7.5 mg by mouth at bedtime.  04/10/17   Sater, Nanine Means, MD  terbinafine (LAMISIL) 1 % cream Apply 1 application topically daily.     [provider]  triamcinolone cream (  KENALOG) 0.1 % Apply 1 application topically daily as needed (rash).    [provider]  urea 10 %  lotion Apply 1 application topically as needed for dry skin.    [provider]    Physical Exam: Vitals:   07/29/17 0030 07/29/17 0100 07/29/17 0130 07/29/17 0200  BP: 118/80 131/65 116/64 120/61  Pulse: (!) 55 (!) 52 (!) 58 (!) 55  Resp: 10 17 15 14   SpO2: 97% 98% 97% 97%      Constitutional: Moderately built and nourished. Vitals:   07/29/17 0030 07/29/17 0100 07/29/17 0130 07/29/17 0200  BP: 118/80 131/65 116/64 120/61  Pulse: (!) 55 (!) 52 (!) 58 (!) 55  Resp: 10 17 15 14   SpO2: 97% 98% 97% 97%   Eyes: Anicteric no pallor. ENMT: No discharge from the ears eyes nose or mouth. Neck: No mass felt.  No JVD appreciated. Respiratory: No rhonchi or crepitations. Cardiovascular: S1-S2 heard no murmurs appreciated. Abdomen: Soft nontender bowel sounds present. Musculoskeletal: No edema.  No joint effusion.  Skin: No rash. Neurologic: Alert awake oriented to time place and person.  Moves all extremities. Psychiatric: Appears normal.  Normal affect.   Labs on Admission: I have personally reviewed following labs and imaging studies  CBC: Recent Labs  Lab 07/28/17 2345  WBC 8.4  HGB 14.8  HCT 43.9  MCV 97.6  PLT 625   Basic Metabolic Panel: Recent Labs  Lab 07/28/17 2345  NA 135  K 4.8  CL 102  CO2 21*  GLUCOSE 134*  BUN 18  CREATININE 1.79*  CALCIUM 9.2   GFR: CrCl cannot be calculated (Unknown ideal weight.). Liver Function Tests: Recent Labs  Lab 07/28/17 2345  AST 35  ALT 34  ALKPHOS 35*  BILITOT 0.8  PROT 6.8  ALBUMIN 3.2*   Recent Labs  Lab 07/28/17 2345  LIPASE 22   No results for input(s): AMMONIA in the last 168 hours. Coagulation Profile: No results for input(s): INR, PROTIME in the last 168 hours. Cardiac Enzymes: No results for input(s): CKTOTAL, CKMB, CKMBINDEX, TROPONINI in the last 168 hours. BNP (last 3 results) No results for input(s): PROBNP in the last 8760 hours. HbA1C: No results for input(s): HGBA1C in the  last 72 hours. CBG: No results for input(s): GLUCAP in the last 168 hours. Lipid Profile: No results for input(s): CHOL, HDL, LDLCALC, TRIG, CHOLHDL, LDLDIRECT in the last 72 hours. Thyroid Function Tests: No results for input(s): TSH, T4TOTAL, FREET4, T3FREE, THYROIDAB in the last 72 hours. Anemia Panel: No results for input(s): VITAMINB12, FOLATE, FERRITIN, TIBC, IRON, RETICCTPCT in the last 72 hours. Urine analysis:    Component Value Date/Time   COLORURINE YELLOW 02/27/2017 1242   APPEARANCEUR CLEAR 02/27/2017 1242   LABSPEC 1.007 02/27/2017 1242   PHURINE 5.0 02/27/2017 1242   GLUCOSEU >=500 (A) 02/27/2017 1242   HGBUR MODERATE (A) 02/27/2017 1242   BILIRUBINUR NEGATIVE 02/27/2017 1242   KETONESUR NEGATIVE 02/27/2017 1242   PROTEINUR NEGATIVE 02/27/2017 1242   UROBILINOGEN 1.0 02/16/2011 0846   NITRITE NEGATIVE 02/27/2017 1242   LEUKOCYTESUR NEGATIVE 02/27/2017 1242   Sepsis Labs: @LABRCNTIP (procalcitonin:4,lacticidven:4) )No results found for this or any previous visit (from the past 240 hour(s)).   Radiological Exams on Admission: Dg Chest 2 View  Result Date: 07/29/2017 CLINICAL DATA:  Chest pain EXAM: CHEST  2 VIEW COMPARISON:  07/23/2017 FINDINGS: Post sternotomy changes. Streaky atelectasis or scarring at the bases. No focal consolidation or effusion. Stable cardiomediastinal silhouette. No pneumothorax. IMPRESSION:  Streaky bibasilar atelectasis or scarring. No acute pulmonary infiltrate or edema. Electronically Signed   By: Donavan Foil M.D.   On: 07/29/2017 00:20    EKG: Independently reviewed.  Normal sinus rhythm with RBBB with old anterior infarct.  Assessment/Plan Principal Problem:   Chest pain Active Problems:   Hypertension   Mixed hyperlipidemia   PAF (paroxysmal atrial fibrillation) ([facility])   CKD (chronic kidney disease), stage III ([facility])   AAA (abdominal aortic aneurysm) ([facility])   Type 2 diabetes mellitus with vascular disease ([place])    1. Chest pain  with history of CAD status post stenting concerning for angina -chest pain at this time is resolved with sublingual nitroglycerin.  In anticipation of procedure will keep patient on heparin and hold apixaban and cycle cardiac markers.  Patient is on beta-blockers,Plavix and statins and Imdur.  Cardiology consult has been requested.  Check 2D echo. 2. A. Fib -presently rate controlled on metoprolol.  Patient is on heparin instead of apixaban in anticipation of cardiac procedure. 3. Diabetes mellitus type 2 -patient states he takes Levemir around 20 units based on patient's sugar.  At this time I have placed patient on 5 units twice daily with sliding scale coverage.  Closely follow CBGs. 4. History of abdominal aortic hernia some denies any pain. 5. History of hypertension we will continue home medications. 6. Chronic kidney disease stage III creatinine appears to be at baseline. 7. Hyperlipidemia on statins. 8. History of ulcerative colitis takes Mesalamine as needed. 9. History of gout takes prednisone as needed. 10. Stable sleep apnea not very compliant with CPAP but would like to have a CPAP with this admission. 11. History of IBS on Linzess.   DVT prophylaxis: Heparin. Code Status: Full code. Family Communication: Patient's wife. Disposition Plan: Home. Consults called: Cardiology. Admission status: Observation.   Rise Patience MD [facility] Pager [phone].  If 7PM-7AM, please contact night-coverage [email or web address] Password [facility]  07/29/2017, 3:19 AM

## 2017-08-15 ENCOUNTER — Ambulatory Visit
Admission: RE | Admit: 2017-08-15 | Discharge: 2017-08-15 | Disposition: A | Discharge status: Home / Self Care | Payer: 59 | Source: Ambulatory Visit | Attending: Neurology | Admitting: Neurology

## 2017-08-15 ENCOUNTER — Ambulatory Visit (INDEPENDENT_AMBULATORY_CARE_PROVIDER_SITE_OTHER): Payer: 59 | Admitting: Neurology

## 2017-08-15 ENCOUNTER — Other Ambulatory Visit: Payer: Self-pay

## 2017-08-15 ENCOUNTER — Other Ambulatory Visit: Payer: Self-pay | Admitting: Neurology

## 2017-08-15 ENCOUNTER — Encounter: Payer: Self-pay | Admitting: Neurology

## 2017-08-15 VITALS — BP 117/71 | HR 60 | Resp 20 | Ht 72.0 in | Wt 265.0 lb

## 2017-08-15 DIAGNOSIS — I208 Other forms of angina pectoris: Secondary | ICD-10-CM | POA: Diagnosis not present

## 2017-08-15 DIAGNOSIS — M542 Cervicalgia: Secondary | ICD-10-CM

## 2017-08-15 DIAGNOSIS — G44309 Post-traumatic headache, unspecified, not intractable: Secondary | ICD-10-CM

## 2017-08-15 DIAGNOSIS — E1142 Type 2 diabetes mellitus with diabetic polyneuropathy: Secondary | ICD-10-CM

## 2017-08-15 DIAGNOSIS — R51 Headache: Secondary | ICD-10-CM | POA: Diagnosis not present

## 2017-08-15 DIAGNOSIS — F0781 Postconcussional syndrome: Secondary | ICD-10-CM

## 2017-08-15 DIAGNOSIS — G4700 Insomnia, unspecified: Secondary | ICD-10-CM

## 2017-08-15 MED ORDER — ALPRAZOLAM 0.25 MG PO TABS: 0.2500 mg | ORAL_TABLET | Freq: Every evening | ORAL | 4 refills | Status: DC | PRN

## 2017-08-15 MED ORDER — NORTRIPTYLINE HCL 25 MG PO CAPS: 25.0000 mg | ORAL_CAPSULE | Freq: Every day | ORAL | 5 refills | Status: DC

## 2017-08-15 MED ORDER — ONDANSETRON 8 MG PO TBDP: 8.0000 mg | ORAL_TABLET | Freq: Two times a day (BID) | ORAL | 5 refills | Status: DC | PRN

## 2017-08-15 NOTE — Progress Notes (Signed)
[facility]  PATIENT: Matthew [name] DOB: 1945-06-13  REFERRING DOCTOR OR PCP:  Lennette Bihari Via SOURCE: Patient, notes from Dr. Lynelle Doctor, emergency room notes, CT scan reports.  _________________________________   HISTORICAL  CHIEF COMPLAINT:  Chief Complaint  Patient presents with  . Postconcussive Syndrome    Sts. h/a's are same frequency, same severity.  He still has n/v.  Temazepam helps sleep but is expensive, even with [email or web address]  . Headache  . Insomnia    HISTORY OF PRESENT ILLNESS:  Matthew [name] Is a 72 year old man with postconcussive syndrome.  Update 08/15/2017:  He reports that he is continuing to experience headaches despite nortriptyline 25 mg. They are at the same frequency and severity. He will get nausea and projectile vomiting that is not always associated with headache.  These started after the falls.     The nortriptyline has not helped much.    Zofran has not helped much  He has had several falls, one 01/28/2017. The back of his head and another one 03/18/2017 in the left frontal region.  He did not have any hemorrhages with these.   He had a couple other falls too.    N/V has seemed worse since that fall.   Also he seems a little more confused.     He also is quick to get irritable.      The MRI of the brain 05/09/2018 showed generalized cortical atrophy and some chronic buccal vessel ischemic changes. There were no acute findings. He did have chronic microhemorrhages in the right basal ganglia and left cerebellum that are likely be sequela of hypertension  The insomnia is better with temazepam however it is expensive and he would like something else.   Update 04/10/2017:     The TPI last visit helped a lot for a few days   Since last visit, nortriptyline 10 mg was added. He feels the headaches are minimally better (10%). However, we also reduce the trazodone and he feels his sleep is doing worse.   His poor balance is better.     He  sleeps more than he used to He feels unmotivated to get out of bed.      He was unable to complete the MRI due to nausea and claustrophobia. He had another fall while urinating (he needs to strain due to a large prostate) on 03/18/2017 when he became weak and dizzy and he hit the left frontal region on the sink. The CT scan 03/19/2027 was personally reviewed. It did not show any acute intracranial abnormalities. 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      He is on chronic oxycodone 10/325 qid and has constipation  From 03/13/2017: On 01/28/2017, he fell on a slippery floor and landed on his back and the back of the head.   There was no LOC.   Later that night, he  Began to notice a headache and has had one since.    The headache is bi-occipital and radiates to the forehead.    Sometimes, the pain is all over.  Pain is a constant pressure sensation.    He wakes up with pain and goes to bed with a headache.    He takes oxycodone for his back (4/day) but it has not helped the headache much.   Nothing seems to increase or decrease the intensity much.    Vision is  blurry at times.     He has had nausea and near-vomiting but nothing has come up.   He has been prescribed Zofran.     He is more indecisive and doesn't want to be alone since the incident and seems cognitively more slowed according to his wife.    He reports being more tired.         He first went to the [place] 01/29/2017 and had CT of the head (no bleed) and xrays of the cervical spine (no fractures).   He woke up one morning in intense pain and felt ;like he was dying'.   He was pacing and couldn't stay still.    His son took him to the ED 02/27/2017.   He went to [facility] ED 02/22/17 [name]) and [place] 02/27/2017.     Besides the headache, back pain has been worse since his fall.   He sees pain management and is on oxycodone.  He has  diabetes mellitus.  He has been told he has neuropathy from the diabetes.Marland Kitchen He also has a history of an MI April 29 and had a stent placed. He has had arrhythmias and is on amiodarone.  A CT scan report ([facility]) 02/22/2017 reports cortical and cerebellar atrophy, bilateral cerebellar lacunar infarctions and chronic microvessel ischemic changes. There was no report of any bleed.  REVIEW OF SYSTEMS: Constitutional: No fevers, chills, sweats, or change in appetite Eyes: No visual changes, double vision, eye pain Ear, nose and throat: No hearing loss, ear pain, nasal congestion, sore throat Cardiovascular: Has history of MI and A. fib. Respiratory: No shortness of breath at rest or with exertion.   No wheezes.   He has OSA ans has CPAP GastrointestinaI: No nausea, vomiting, diarrhea, abdominal pain, fecal incontinence Genitourinary: No dysuria, urinary retention or frequency.  No nocturia. Musculoskeletal: He reports neck and back pain, see above Integumentary: No rash, pruritus, skin lesions Neurological: as above Psychiatric: No depression at this time.  No anxiety Endocrine: No palpitations, diaphoresis, change in appetite, change in weigh or increased thirst Hematologic/Lymphatic: No anemia, purpura, petechiae. Allergic/Immunologic: No itchy/runny eyes, nasal congestion, recent allergic reactions, rashes  ALLERGIES: Allergies  Allergen Reactions  . Tetanus Toxoids Hives    HOME MEDICATIONS:  Current Outpatient Medications:  .  amiodarone (PACERONE) 200 MG tablet, Take 0.5 tablets (100 mg total) by mouth 2 (two) times daily., Disp: , Rfl:  .  apixaban (ELIQUIS) 5 MG TABS tablet, Take 5 mg by mouth 2 (two) times daily., Disp: , Rfl:  .  buPROPion (WELLBUTRIN XL) 150 MG 24 hr tablet, Take 150 mg by mouth daily., Disp: , Rfl:  .  capsaicin (ZOSTRIX) 0.025 % cream, Apply 1 application topically 2 (two) times daily as needed (for pain). , Disp: , Rfl:  .  cholecalciferol (VITAMIN D)  1000 units tablet, Take 1,000 Units by mouth daily., Disp: , Rfl:  .  clopidogrel (PLAVIX) 75 MG tablet, Take 1 tablet (75 mg total) by mouth daily with breakfast., Disp: 30 tablet, Rfl: 11 .  clotrimazole (LOTRIMIN) 1 % cream, Apply 1 application topically 2 (two) times daily as needed (for rash). , Disp: , Rfl:  .  ferrous sulfate 325 (65 FE) MG tablet, Take 325 mg by mouth 3 (three) times a week. Tuesday, Wednesday and Friday, Disp: , Rfl:  .  finasteride (PROSCAR) 5 MG tablet, Take 5 mg by mouth daily., Disp: , Rfl:  .  gabapentin (NEURONTIN) 300 MG capsule, Take 300 mg by  mouth 2 (two) times daily. , Disp: , Rfl:  .  glucose 4 GM chewable tablet, Chew 1 tablet by mouth as needed for low blood sugar., Disp: , Rfl:  .  insulin aspart (NOVOLOG) 100 UNIT/ML injection, Inject 25 Units into the skin 2 (two) times daily as needed for high blood sugar. 25 units before lunch and 25 units before dinner, Disp: , Rfl:  .  Insulin Detemir (LEVEMIR FLEXPEN) 100 UNIT/ML Pen, Inject 50 Units into the skin 2 (two) times daily. , Disp: , Rfl:  .  isosorbide mononitrate (IMDUR) 60 MG 24 hr tablet, Take 1 tablet (60 mg total) by mouth daily. Hold if blood pressure <100, Disp: 30 tablet, Rfl: 0 .  ketoconazole (NIZORAL) 2 % cream, Apply 1 application topically daily as needed. For rash , Disp: , Rfl:  .  levothyroxine (SYNTHROID, LEVOTHROID) 150 MCG tablet, Take 150 mcg by mouth daily before breakfast., Disp: , Rfl:  .  LIDOCAINE EX, Apply 1 application topically 2 (two) times daily as needed (for pain). , Disp: , Rfl:  .  linaclotide (LINZESS) 290 MCG CAPS capsule, Take 290 mcg by mouth daily as needed (constipation). , Disp: , Rfl:  .  Magnesium Oxide 420 MG TABS, Take 420 mg by mouth daily. , Disp: , Rfl:  .  MENTHOL-METHYL SALICYLATE EX, Apply 1 application topically as needed (pain)., Disp: , Rfl:  .  mesalamine (CANASA) 1000 MG suppository, Place 1,000 mg rectally at bedtime as needed (ulcerative colitis  flares). , Disp: , Rfl:  .  mesalamine (LIALDA) 1.2 g EC tablet, Take 1.2 g by mouth daily as needed (ulcerative colitis flares)., Disp: , Rfl:  .  methylPREDNISolone (MEDROL) 4 MG tablet, Take 4 mg by mouth 2 (two) times daily as needed (gout flares). , Disp: , Rfl:  .  metoprolol succinate (TOPROL-XL) 25 MG 24 hr tablet, Take 12.5 mg by mouth daily., Disp: , Rfl:  .  mupirocin ointment (BACTROBAN) 2 %, Place 1 application into the nose daily as needed (for mersa). , Disp: , Rfl:  .  neomycin-bacitracin-polymyxin (NEOSPORIN) ointment, Apply 1 application topically as needed for wound care., Disp: , Rfl:  .  nitroGLYCERIN (NITROSTAT) 0.4 MG SL tablet, Place 0.4 mg under the tongue every 5 (five) minutes as needed for chest pain (x 3 doses daily). , Disp: , Rfl:  .  nortriptyline (PAMELOR) 25 MG capsule, Take 1 capsule (25 mg total) by mouth at bedtime., Disp: 30 capsule, Rfl: 5 .  nystatin cream (MYCOSTATIN), Apply 1 application topically daily as needed for dry skin (rash)., Disp: , Rfl:  .  Omega-3 Fatty Acids (FISH OIL) 1000 MG CAPS, Take 2,000 mg by mouth 2 (two) times daily. , Disp: , Rfl:  .  ondansetron (ZOFRAN-ODT) 8 MG disintegrating tablet, Take 1 tablet (8 mg total) by mouth 2 (two) times daily as needed for nausea or vomiting., Disp: 30 tablet, Rfl: 5 .  oxyCODONE-acetaminophen (PERCOCET) 10-325 MG per tablet, Take 1 tablet by mouth every 6 (six) hours as needed for pain. , Disp: , Rfl:  .  RABEprazole (ACIPHEX) 20 MG tablet, Take 40 mg by mouth 2 (two) times daily before a meal. , Disp: , Rfl:  .  rosuvastatin (CRESTOR) 40 MG tablet, Take 40 mg by mouth at bedtime. , Disp: , Rfl:  .  silver sulfADIAZINE (SILVADENE) 1 % cream, Apply 1 application topically daily as needed (skin care). , Disp: , Rfl:  .  temazepam (RESTORIL) 7.5 MG capsule,  Take 1 capsule (7.5 mg total) by mouth at bedtime as needed for sleep. (Patient taking differently: Take 7.5 mg by mouth at bedtime. ), Disp: 30  capsule, Rfl: 3 .  terbinafine (LAMISIL) 1 % cream, Apply 1 application topically daily as needed (skin care). , Disp: , Rfl:  .  triamcinolone cream (KENALOG) 0.1 %, Apply 1 application topically daily as needed (rash)., Disp: , Rfl:  .  urea 10 % lotion, Apply 1 application topically as needed for dry skin., Disp: , Rfl:  .  ALPRAZolam (XANAX) 0.25 MG tablet, Take 1 tablet (0.25 mg total) by mouth at bedtime as needed for anxiety., Disp: 30 tablet, Rfl: 4  PAST MEDICAL HISTORY: Past Medical History:  Diagnosis Date  . AAA (abdominal aortic aneurysm) ([place])    a. Aortic US 05/2012: 3.9 cm fusiform abdominal aortic aneurysm. F/u recommended 05/2014.  Marland Kitchen Anemia   . Arthritis    DDD  . Atrial fibrillation ([place])    a. Post-op CABG 05/2009. b. Re-identified 12/2013.   Marland Kitchen CAD (coronary artery disease)    a. s/p CABGx4 05/2009. 10/10/16 PCI with DES x1 SVG-->OM2/OM3  . Chronic constipation   . CKD (chronic kidney disease), stage III ([place])   . Cocaine abuse in remission ([place])   . Colon polyps   . Depression   . Diabetes mellitus   . Empyema lung ([place])    a. PNA 02/2011 c/b empyema requiring chest tube, drainage of pleural effusion and decortication.  . Enlarged prostate   . Frequent falls   . GERD (gastroesophageal reflux disease)   . Headache   . Hepatic steatosis   . Hyperlipidemia   . Hypertension   . Hypothyroidism   . Iron deficiency anemia   . LAFB (left anterior fascicular block)   . Lung cancer ([place])    a. Tx with interleukin in 2002 per patient.  . Myocardial infarction ([place])    10/08/16  . Nodule of left lung    a. Cavitary nodules 02/2011, f/u recommended.  . Obesity   . Paroxysmal supraventricular tachycardia ([place])    a. While inpatient 02/2011 sick with empyema - wide appearace due to RBBB.  . Pneumonia   . Post concussion syndrome   . RBBB   . Renal cell carcinoma    a. s/p R nephrectomy 2002. Reported pulmonary mets per note at that time.  . Scrotal abscess    a. 2012  2/2 Streptococcus group C, status post incision and drainage.  . Sleep apnea    wears CPAP  . 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 Remote dx dating back to 1979.  . Vitamin D deficiency   . Wears dentures     PAST SURGICAL HISTORY: Past Surgical History:  Procedure Laterality Date  . Bronchoscopy, right video-assisted thoracoscopy, minithoracotomy, drainage of pleural effusion, and decortication.  02/26/2011   [facility]  . CABG x 4  05/13/2009   [facility]  . CARDIAC SURGERY    . COLONOSCOPY W/ BIOPSIES AND POLYPECTOMY    . CORONARY STENT INTERVENTION N/A 10/10/2016   Procedure: Coronary Stent Intervention;  Surgeon: Troy Sine, MD;  Location: [facility];  Service: Cardiovascular;  Laterality: N/A;  . HEMORRHOID SURGERY    . HERNIA REPAIR    . Incision, drainage and debridement of perineum and  scrotum    . Irrigation and debridement of scrotal wound and closure of scrotal wound.    Marland Kitchen LEFT HEART CATH AND CORS/GRAFTS ANGIOGRAPHY N/A 10/10/2016  Procedure: Left Heart Cath and Cors/Grafts Angiography;  Surgeon: Troy Sine, MD;  Location: [facility];  Service: Cardiovascular;  Laterality: N/A;  . MULTIPLE TOOTH EXTRACTIONS    . RADIOLOGY WITH ANESTHESIA N/A 05/09/2017   Procedure: MRI WITH ANESTHESIA   BRAIN WITHOUT CONTRAST;  Surgeon: Radiologist, Medication, MD;  Location: [facility];  Service: Radiology;  Laterality: N/A;  . Right radical nephrectomy.    . TOOTH EXTRACTION      FAMILY HISTORY: Family History  Problem Relation Age of Onset  . Heart disease Father   . Alzheimer's disease Mother   . Pneumonia Unknown   . Colon cancer Neg Hx   . Esophageal cancer Neg Hx   . Stomach cancer Neg Hx   . Rectal cancer Neg Hx     SOCIAL HISTORY:  Social History   Socioeconomic History  . Marital status: Married    Spouse name: Not on file  . Number of children: 4  . Years of education: Not on file  . Highest education level: Not on file  Social Needs  .  Financial resource strain: Not on file  . Food insecurity - worry: Not on file  . Food insecurity - inability: Not on file  . Transportation needs - medical: Not on file  . Transportation needs - non-medical: Not on file  Occupational History  . Occupation: CAR SERVICE    Employer: SELF EMPLOYED  Tobacco Use  . Smoking status: Never Smoker  . Smokeless tobacco: Never Used  Substance and Sexual Activity  . Alcohol use: No  . Drug use: Yes    Types: Cocaine    Comment: stopped cocaine 2 and 1/2 years ago  . Sexual activity: Not on file  Other Topics Concern  . Not on file  Social History Narrative  . Not on file     PHYSICAL EXAM  Vitals:   08/15/17 0836  BP: 117/71  Pulse: 60  Resp: 20  Weight: 265 lb (120.2 kg)  Height: 6' (1.829 m)    Body mass index is 35.94 kg/m.   General: The patient is well-developed and well-nourished and in no acute distress.    He is tender over the splenius capitis muscles/occiput. Range of motion is fairly normal in the neck.   Neurologic Exam  Mental status: The patient is alert and oriented x 3 at the time of the examination. The patient has apparent normal recent and remote memory, with an apparently normal attention span and concentration ability.   Speech is normal.  Cranial nerves: Extraocular movements are full. Facial strength and sensation normal. Trapezius strength is normal.   No obvious hearing deficits are noted.  Motor:  Muscle bulk is normal.   Tone is normal. Strength is  5 / 5 in all 4 extremities.   Sensory:    Sensation is normal in the arms. He has reduced vibration in the feet.  Coordination: Cerebellar testing shows good finger-nose-finger and heel-to-shin..  Gait and station: Station is normal.   Gait is arthrtic. The tandem gait is wide. Romberg is negative..   Reflexes: Deep tendon reflexes are symmetric and normal bilaterally in arms, absent at the ankles.    DIAGNOSTIC DATA (LABS, IMAGING, TESTING) - I  reviewed patient records, labs, notes, testing and imaging myself where available.  Lab Results  Component Value Date   WBC 8.4 07/28/2017   HGB 14.8 07/28/2017   HCT 43.9 07/28/2017   MCV 97.6 07/28/2017   PLT 232 07/28/2017  Component Value Date/Time   NA 135 07/28/2017 2345   K 4.8 07/28/2017 2345   CL 102 07/28/2017 2345   CO2 21 (L) 07/28/2017 2345   GLUCOSE 134 (H) 07/28/2017 2345   BUN 18 07/28/2017 2345   CREATININE 1.79 (H) 07/28/2017 2345   CALCIUM 9.2 07/28/2017 2345   PROT 6.8 07/28/2017 2345   ALBUMIN 3.2 (L) 07/28/2017 2345   AST 35 07/28/2017 2345   ALT 34 07/28/2017 2345   ALT 79 (H) 10/20/2014 1225   ALKPHOS 35 (L) 07/28/2017 2345   BILITOT 0.8 07/28/2017 2345   GFRNONAA 36 (L) 07/28/2017 2345   GFRAA 42 (L) 07/28/2017 2345   Lab Results  Component Value Date   CHOL 194 10/09/2016   HDL 28 (L) 10/09/2016   LDLCALC 113 (H) 10/09/2016   TRIG 265 (H) 10/09/2016   CHOLHDL 6.9 10/09/2016   Lab Results  Component Value Date   HGBA1C 8.6 (H) 05/09/2017   No results found for: VITAMINB12 Lab Results  Component Value Date   TSH 2.090 10/08/2016       ASSESSMENT AND PLAN  Post concussion syndrome  Post-traumatic headache, not intractable, unspecified chronicity pattern - Plan: CT HEAD WO CONTRAST  Diabetic polyneuropathy associated with type 2 diabetes mellitus (HCC)  Neck pain  Insomnia, unspecified type  1.    He has had a couple more falls and is doing worse compared to his last examination. We need to check a CT scan to make sure that he does not have subdural hematomas that may require intervention.  2     Continue nortriptyline 25 mg nightly. He is on amiodarone and we will increase this dose further. 3.    Change temazepam to alprazolam at bedtime. This will be much less expensive for them..   Zofran when necessary nausea and vomiting. 4.    Trigger point injections of the splenius capitis muscles with 80 mg Depo-Medrol in [place]  using sterile technique. He tolerated the injections well and there were no complications.  5.    Return to see me in 2-3 months or sooner if there are new or worsening neurologic symptoms.   Manon Banbury A. Felecia Shelling, MD, [facility] 0/06/930, 3:55 PM Certified in Neurology, Clinical Neurophysiology, Sleep Medicine, Pain Medicine and Neuroimaging  [facility] [address], [place], [place] [address] [phone]

## 2017-08-16 ENCOUNTER — Telehealth: Payer: Self-pay | Admitting: *Deleted

## 2017-08-16 NOTE — Telephone Encounter (Signed)
-----   Message from Britt Bottom, MD sent at 08/16/2017 10:47 AM EST ----- Please let them know that the CT scan looked good (the brain was unchanged from his last CT scan) ---- no evidence of subdural hematoma or other significant problem

## 2017-08-16 NOTE — Telephone Encounter (Signed)
Spoke with Mrs. Behar and reviewed below CT results.  She verbalized understanding of same/fim

## 2017-10-02 ENCOUNTER — Encounter: Payer: Self-pay | Admitting: Neurology

## 2017-10-18 ENCOUNTER — Ambulatory Visit: Payer: Medicare Other | Admitting: Neurology

## 2017-11-19 ENCOUNTER — Encounter: Payer: Self-pay | Admitting: Neurology

## 2017-11-19 ENCOUNTER — Ambulatory Visit (INDEPENDENT_AMBULATORY_CARE_PROVIDER_SITE_OTHER): Payer: 59 | Admitting: Neurology

## 2017-11-19 ENCOUNTER — Other Ambulatory Visit: Payer: Self-pay

## 2017-11-19 VITALS — BP 117/78 | HR 82 | Resp 20 | Ht 72.0 in | Wt 264.5 lb

## 2017-11-19 DIAGNOSIS — I208 Other forms of angina pectoris: Secondary | ICD-10-CM

## 2017-11-19 DIAGNOSIS — E1142 Type 2 diabetes mellitus with diabetic polyneuropathy: Secondary | ICD-10-CM

## 2017-11-19 DIAGNOSIS — F0781 Postconcussional syndrome: Secondary | ICD-10-CM

## 2017-11-19 DIAGNOSIS — G4700 Insomnia, unspecified: Secondary | ICD-10-CM | POA: Diagnosis not present

## 2017-11-19 DIAGNOSIS — M542 Cervicalgia: Secondary | ICD-10-CM | POA: Diagnosis not present

## 2017-11-19 DIAGNOSIS — R51 Headache: Secondary | ICD-10-CM

## 2017-11-19 MED ORDER — NORTRIPTYLINE HCL 25 MG PO CAPS: 25.0000 mg | ORAL_CAPSULE | Freq: Every day | ORAL | 5 refills | Status: DC

## 2017-11-19 MED ORDER — ALPRAZOLAM 0.25 MG PO TABS: 0.2500 mg | ORAL_TABLET | Freq: Every evening | ORAL | 4 refills | Status: DC | PRN

## 2017-11-19 MED ORDER — ONDANSETRON 8 MG PO TBDP: 8.0000 mg | ORAL_TABLET | Freq: Two times a day (BID) | ORAL | 5 refills | Status: DC | PRN

## 2017-11-19 NOTE — Progress Notes (Signed)
[facility]  PATIENT: Matthew [name] DOB: 20-Nov-1945  REFERRING DOCTOR OR PCP:  Lennette Bihari Via SOURCE: Patient, notes from Dr. Lynelle Doctor, emergency room notes, CT scan reports.  _________________________________   HISTORICAL  CHIEF COMPLAINT:  Chief Complaint  Patient presents with  . Post-Concussive Syndrome    Sts. h/a's, n/v and difficulty sleeping are improved but still present. Reports compliance with Nortriptyline 53m qhs and confirms he d/c Temazepam and started Alprazolam/fim    HISTORY OF PRESENT ILLNESS:  Matthew FrithIs a 72year old man with postconcussive syndrome.  Update 11/19/2017: He feels he is doing mildly better but still has headaches and nausea with occasional vomiting (203 times a month).     Zofran has helped the nausea some.    He has pain from the right occiput to the forehead.   The TPI helped a lot with the intensity of the pain.    He feels his gait is slightly better but he has not been exercising much.   He has a cane but has not recently used it.     Sleep is much better since the alprazolam and it is more affordable than temazepam.        Update 08/15/2017:  He reports that he is continuing to experience headaches despite nortriptyline 25 mg. They are at the same frequency and severity. He will get nausea and projectile vomiting that is not always associated with headache.  These started after the falls.     The nortriptyline has not helped much.    Zofran has not helped much  He has had several falls, one 01/28/2017. The back of his head and another one 03/18/2017 in the left frontal region.  He did not have any hemorrhages with these.   He had a couple other falls too.    N/V has seemed worse since that fall.   Also he seems a little more confused.     He also is quick to get irritable.      The MRI of the brain 05/09/2018 showed generalized cortical atrophy and some chronic buccal vessel ischemic changes. There were no acute  findings. He did have chronic microhemorrhages in the right basal ganglia and left cerebellum that are likely be sequela of hypertension  The insomnia is better with temazepam however it is expensive and he would like something else.   Update 04/10/2017:     The TPI last visit helped a lot for a few days   Since last visit, nortriptyline 10 mg was added. He feels the headaches are minimally better (10%). However, we also reduce the trazodone and he feels his sleep is doing worse.   His poor balance is better.     He sleeps more than he used to He feels unmotivated to get out of bed.      He was unable to complete the MRI due to nausea and claustrophobia. He had another fall while urinating (he needs to strain due to a large prostate) on 03/18/2017 when he became weak and dizzy and he hit the left frontal region on the sink. The CT scan 03/19/2027 was personally reviewed. It did not show any acute intracranial abnormalities. 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      He is on chronic oxycodone 10/325 qid and has constipation  From 03/13/2017: On 01/28/2017, he fell on a slippery floor and landed  on his back and the back of the head.   There was no LOC.   Later that night, he  Began to notice a headache and has had one since.    The headache is bi-occipital and radiates to the forehead.    Sometimes, the pain is all over.  Pain is a constant pressure sensation.    He wakes up with pain and goes to bed with a headache.    He takes oxycodone for his back (4/day) but it has not helped the headache much.   Nothing seems to increase or decrease the intensity much.    Vision is blurry at times.     He has had nausea and near-vomiting but nothing has come up.   He has been prescribed Zofran.     He is more indecisive and doesn't want to be alone since the incident and seems cognitively more slowed  according to his wife.    He reports being more tired.         He first went to the [place] 01/29/2017 and had CT of the head (no bleed) and xrays of the cervical spine (no fractures).   He woke up one morning in intense pain and felt ;like he was dying'.   He was pacing and couldn't stay still.    His son took him to the ED 02/27/2017.   He went to [facility] ED 02/22/17 [name]) and [place] 02/27/2017.     Besides the headache, back pain has been worse since his fall.   He sees pain management and is on oxycodone.  He has diabetes mellitus.  He has been told he has neuropathy from the diabetes.Marland Kitchen He also has a history of an MI April 29 and had a stent placed. He has had arrhythmias and is on amiodarone.  A CT scan report ([facility]) 02/22/2017 reports cortical and cerebellar atrophy, bilateral cerebellar lacunar infarctions and chronic microvessel ischemic changes. There was no report of any bleed.  REVIEW OF SYSTEMS: Constitutional: No fevers, chills, sweats, or change in appetite Eyes: No visual changes, double vision, eye pain Ear, nose and throat: No hearing loss, ear pain, nasal congestion, sore throat Cardiovascular: Has history of MI and A. fib. Respiratory: No shortness of breath at rest or with exertion.   No wheezes.   He has OSA ans has CPAP GastrointestinaI: No nausea, vomiting, diarrhea, abdominal pain, fecal incontinence Genitourinary: No dysuria, urinary retention or frequency.  No nocturia. Musculoskeletal: He reports neck and back pain, see above Integumentary: No rash, pruritus, skin lesions Neurological: as above Psychiatric: No depression at this time.  No anxiety Endocrine: No palpitations, diaphoresis, change in appetite, change in weigh or increased thirst Hematologic/Lymphatic: No anemia, purpura, petechiae. Allergic/Immunologic: No itchy/runny eyes, nasal congestion, recent allergic reactions, rashes  ALLERGIES: Allergies  Allergen Reactions  . Tetanus Toxoids Hives      HOME MEDICATIONS:  Current Outpatient Medications:  .  ALPRAZolam (XANAX) 0.25 MG tablet, Take 1 tablet (0.25 mg total) by mouth at bedtime as needed for anxiety., Disp: 30 tablet, Rfl: 4 .  amiodarone (PACERONE) 200 MG tablet, Take 0.5 tablets (100 mg total) by mouth 2 (two) times daily., Disp: , Rfl:  .  apixaban (ELIQUIS) 5 MG TABS tablet, Take 5 mg by mouth 2 (two) times daily., Disp: , Rfl:  .  buPROPion (WELLBUTRIN XL) 150 MG 24 hr tablet, Take 150 mg by mouth daily., Disp: , Rfl:  .  capsaicin (ZOSTRIX) 0.025 % cream, Apply  1 application topically 2 (two) times daily as needed (for pain). , Disp: , Rfl:  .  cholecalciferol (VITAMIN D) 1000 units tablet, Take 1,000 Units by mouth daily., Disp: , Rfl:  .  clopidogrel (PLAVIX) 75 MG tablet, Take 1 tablet (75 mg total) by mouth daily with breakfast., Disp: 30 tablet, Rfl: 11 .  clotrimazole (LOTRIMIN) 1 % cream, Apply 1 application topically 2 (two) times daily as needed (for rash). , Disp: , Rfl:  .  ferrous sulfate 325 (65 FE) MG tablet, Take 325 mg by mouth 3 (three) times a week. Tuesday, Wednesday and Friday, Disp: , Rfl:  .  finasteride (PROSCAR) 5 MG tablet, Take 5 mg by mouth daily., Disp: , Rfl:  .  gabapentin (NEURONTIN) 300 MG capsule, Take 300 mg by mouth 2 (two) times daily. , Disp: , Rfl:  .  glucose 4 GM chewable tablet, Chew 1 tablet by mouth as needed for low blood sugar., Disp: , Rfl:  .  insulin aspart (NOVOLOG) 100 UNIT/ML injection, Inject 25 Units into the skin 2 (two) times daily as needed for high blood sugar. 25 units before lunch and 25 units before dinner, Disp: , Rfl:  .  Insulin Detemir (LEVEMIR FLEXPEN) 100 UNIT/ML Pen, Inject 50 Units into the skin 2 (two) times daily. , Disp: , Rfl:  .  isosorbide mononitrate (IMDUR) 60 MG 24 hr tablet, Take 1 tablet (60 mg total) by mouth daily. Hold if blood pressure <100, Disp: 30 tablet, Rfl: 0 .  ketoconazole (NIZORAL) 2 % cream, Apply 1 application topically daily as  needed. For rash , Disp: , Rfl:  .  levothyroxine (SYNTHROID, LEVOTHROID) 150 MCG tablet, Take 150 mcg by mouth daily before breakfast., Disp: , Rfl:  .  LIDOCAINE EX, Apply 1 application topically 2 (two) times daily as needed (for pain). , Disp: , Rfl:  .  linaclotide (LINZESS) 290 MCG CAPS capsule, Take 290 mcg by mouth daily as needed (constipation). , Disp: , Rfl:  .  Magnesium Oxide 420 MG TABS, Take 420 mg by mouth daily. , Disp: , Rfl:  .  MENTHOL-METHYL SALICYLATE EX, Apply 1 application topically as needed (pain)., Disp: , Rfl:  .  mesalamine (CANASA) 1000 MG suppository, Place 1,000 mg rectally at bedtime as needed (ulcerative colitis flares). , Disp: , Rfl:  .  mesalamine (LIALDA) 1.2 g EC tablet, Take 1.2 g by mouth daily as needed (ulcerative colitis flares)., Disp: , Rfl:  .  methylPREDNISolone (MEDROL) 4 MG tablet, Take 4 mg by mouth 2 (two) times daily as needed (gout flares). , Disp: , Rfl:  .  metoprolol succinate (TOPROL-XL) 25 MG 24 hr tablet, Take 12.5 mg by mouth daily., Disp: , Rfl:  .  mupirocin ointment (BACTROBAN) 2 %, Place 1 application into the nose daily as needed (for mersa). , Disp: , Rfl:  .  neomycin-bacitracin-polymyxin (NEOSPORIN) ointment, Apply 1 application topically as needed for wound care., Disp: , Rfl:  .  nitroGLYCERIN (NITROSTAT) 0.4 MG SL tablet, Place 0.4 mg under the tongue every 5 (five) minutes as needed for chest pain (x 3 doses daily). , Disp: , Rfl:  .  nortriptyline (PAMELOR) 25 MG capsule, Take 1 capsule (25 mg total) by mouth at bedtime., Disp: 30 capsule, Rfl: 5 .  nystatin cream (MYCOSTATIN), Apply 1 application topically daily as needed for dry skin (rash)., Disp: , Rfl:  .  Omega-3 Fatty Acids (FISH OIL) 1000 MG CAPS, Take 2,000 mg by mouth 2 (two)  times daily. , Disp: , Rfl:  .  ondansetron (ZOFRAN-ODT) 8 MG disintegrating tablet, Take 1 tablet (8 mg total) by mouth 2 (two) times daily as needed for nausea or vomiting., Disp: 30 tablet,  Rfl: 5 .  oxyCODONE-acetaminophen (PERCOCET) 10-325 MG per tablet, Take 1 tablet by mouth every 6 (six) hours as needed for pain. , Disp: , Rfl:  .  RABEprazole (ACIPHEX) 20 MG tablet, Take 40 mg by mouth 2 (two) times daily before a meal. , Disp: , Rfl:  .  rosuvastatin (CRESTOR) 40 MG tablet, Take 40 mg by mouth at bedtime. , Disp: , Rfl:  .  silver sulfADIAZINE (SILVADENE) 1 % cream, Apply 1 application topically daily as needed (skin care). , Disp: , Rfl:  .  terbinafine (LAMISIL) 1 % cream, Apply 1 application topically daily as needed (skin care). , Disp: , Rfl:  .  triamcinolone cream (KENALOG) 0.1 %, Apply 1 application topically daily as needed (rash)., Disp: , Rfl:  .  urea 10 % lotion, Apply 1 application topically as needed for dry skin., Disp: , Rfl:   PAST MEDICAL HISTORY: Past Medical History:  Diagnosis Date  . AAA (abdominal aortic aneurysm) ([place])    a. Aortic US 05/2012: 3.9 cm fusiform abdominal aortic aneurysm. F/u recommended 05/2014.  Marland Kitchen Anemia   . Arthritis    DDD  . Atrial fibrillation ([place])    a. Post-op CABG 05/2009. b. Re-identified 12/2013.   Marland Kitchen CAD (coronary artery disease)    a. s/p CABGx4 05/2009. 10/10/16 PCI with DES x1 SVG-->OM2/OM3  . Chronic constipation   . CKD (chronic kidney disease), stage III ([place])   . Cocaine abuse in remission ([place])   . Colon polyps   . Depression   . Diabetes mellitus   . Empyema lung ([place])    a. PNA 02/2011 c/b empyema requiring chest tube, drainage of pleural effusion and decortication.  . Enlarged prostate   . Frequent falls   . GERD (gastroesophageal reflux disease)   . Headache   . Hepatic steatosis   . Hyperlipidemia   . Hypertension   . Hypothyroidism   . Iron deficiency anemia   . LAFB (left anterior fascicular block)   . Lung cancer ([place])    a. Tx with interleukin in 2002 per patient.  . Myocardial infarction ([place])    10/08/16  . Nodule of left lung    a. Cavitary nodules 02/2011, f/u recommended.  . Obesity     . Paroxysmal supraventricular tachycardia ([place])    a. While inpatient 02/2011 sick with empyema - wide appearace due to RBBB.  . Pneumonia   . Post concussion syndrome   . RBBB   . Renal cell carcinoma    a. s/p R nephrectomy 2002. Reported pulmonary mets per note at that time.  . Scrotal abscess    a. 2012 2/2 Streptococcus group C, status post incision and drainage.  . Sleep apnea    wears CPAP  . 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  . Vitamin D deficiency   . Wears dentures     PAST SURGICAL HISTORY: Past Surgical History:  Procedure Laterality Date  . Bronchoscopy, right video-assisted thoracoscopy, minithoracotomy, drainage of pleural effusion, and decortication.  02/26/2011   [facility]  . CABG x 4  05/13/2009   [facility]  . CARDIAC SURGERY    . COLONOSCOPY W/ BIOPSIES AND POLYPECTOMY    . CORONARY STENT INTERVENTION N/A 10/10/2016  Procedure: Coronary Stent Intervention;  Surgeon: Troy Sine, MD;  Location: [facility];  Service: Cardiovascular;  Laterality: N/A;  . HEMORRHOID SURGERY    . HERNIA REPAIR    . Incision, drainage and debridement of perineum and  scrotum    . Irrigation and debridement of scrotal wound and closure of scrotal wound.    Marland Kitchen LEFT HEART CATH AND CORS/GRAFTS ANGIOGRAPHY N/A 10/10/2016   Procedure: Left Heart Cath and Cors/Grafts Angiography;  Surgeon: Troy Sine, MD;  Location: [facility];  Service: Cardiovascular;  Laterality: N/A;  . MULTIPLE TOOTH EXTRACTIONS    . RADIOLOGY WITH ANESTHESIA N/A 05/09/2017   Procedure: MRI WITH ANESTHESIA   BRAIN WITHOUT CONTRAST;  Surgeon: Radiologist, Medication, MD;  Location: [facility];  Service: Radiology;  Laterality: N/A;  . Right radical nephrectomy.    . TOOTH EXTRACTION      FAMILY HISTORY: Family History  Problem Relation Age of Onset  . Heart disease Father   . Alzheimer's disease Mother   . Pneumonia Unknown   . Colon cancer Neg Hx   . Esophageal  cancer Neg Hx   . Stomach cancer Neg Hx   . Rectal cancer Neg Hx     SOCIAL HISTORY:  Social History   Socioeconomic History  . Marital status: Married    Spouse name: Not on file  . Number of children: 4  . Years of education: Not on file  . Highest education level: Not on file  Occupational History  . Occupation: CAR SERVICE    Employer: [employer]  . Financial resource strain: Not on file  . Food insecurity:    Worry: Not on file    Inability: Not on file  . Transportation needs:    Medical: Not on file    Non-medical: Not on file  Tobacco Use  . Smoking status: Never Smoker  . Smokeless tobacco: Never Used  Substance and Sexual Activity  . Alcohol use: No  . Drug use: Yes    Types: Cocaine    Comment: stopped cocaine 2 and 1/2 years ago  . Sexual activity: Not on file  Lifestyle  . Physical activity:    Days per week: Not on file    Minutes per session: Not on file  . Stress: Not on file  Relationships  . Social connections:    Talks on phone: Not on file    Gets together: Not on file    Attends religious service: Not on file    Active member of club or organization: Not on file    Attends meetings of clubs or organizations: Not on file    Relationship status: Not on file  . Intimate partner violence:    Fear of current or ex partner: Not on file    Emotionally abused: Not on file    Physically abused: Not on file    Forced sexual activity: Not on file  Other Topics Concern  . Not on file  Social History Narrative  . Not on file     PHYSICAL EXAM  Vitals:   11/19/17 0821  BP: 117/78  Pulse: 82  Resp: 20  Weight: 264 lb 8 oz (120 kg)  Height: 6' (1.829 m)    Body mass index is 35.87 kg/m.   General: The patient is well-developed and well-nourished and in no acute distress.    He is tender over the splenius capitis muscles/occiput. Range of motion is fairly normal  in the neck.   Neurologic Exam  Mental status: The  patient is alert and oriented x 3 at the time of the examination. The patient has apparent normal recent and remote memory, with an apparently normal attention span and concentration ability.   Speech is normal.  Cranial nerves: Extraocular movements are full. Facial strength and sensation normal. Trapezius strength is normal.   No obvious hearing deficits are noted.  Motor:  Muscle bulk is normal.   Tone is normal. Strength is  5/5 in all muscles except 4+/5 EHL.   Sensory:    Sensation is normal in the arms. He has reduced vibration in feet and ankles  Coordination: Cerebellar testing shows good finger-nose-finger and heel-to-shin..  Gait and station: Station is normal.   Gait is arthrtic. Tandem gait is wide.    Romberg is negative..   Reflexes: Deep tendon reflexes are symmetric and normal bilaterally in arms, absent at the ankles.    DIAGNOSTIC DATA (LABS, IMAGING, TESTING) - I reviewed patient records, labs, notes, testing and imaging myself where available.  Lab Results  Component Value Date   WBC 8.4 07/28/2017   HGB 14.8 07/28/2017   HCT 43.9 07/28/2017   MCV 97.6 07/28/2017   PLT 232 07/28/2017      Component Value Date/Time   NA 135 07/28/2017 2345   K 4.8 07/28/2017 2345   CL 102 07/28/2017 2345   CO2 21 (L) 07/28/2017 2345   GLUCOSE 134 (H) 07/28/2017 2345   BUN 18 07/28/2017 2345   CREATININE 1.79 (H) 07/28/2017 2345   CALCIUM 9.2 07/28/2017 2345   PROT 6.8 07/28/2017 2345   ALBUMIN 3.2 (L) 07/28/2017 2345   AST 35 07/28/2017 2345   ALT 34 07/28/2017 2345   ALT 79 (H) 10/20/2014 1225   ALKPHOS 35 (L) 07/28/2017 2345   BILITOT 0.8 07/28/2017 2345   GFRNONAA 36 (L) 07/28/2017 2345   GFRAA 42 (L) 07/28/2017 2345   Lab Results  Component Value Date   CHOL 194 10/09/2016   HDL 28 (L) 10/09/2016   LDLCALC 113 (H) 10/09/2016   TRIG 265 (H) 10/09/2016   CHOLHDL 6.9 10/09/2016   Lab Results  Component Value Date   HGBA1C 8.6 (H) 05/09/2017   No results  found for: VITAMINB12 Lab Results  Component Value Date   TSH 2.090 10/08/2016       ASSESSMENT AND PLAN  Post concussion syndrome  Diabetic polyneuropathy associated with type 2 diabetes mellitus (HCC)  Insomnia, unspecified type  Neck pain  1.    Continue nortriptyline 25 mg nightly. He is on amiodarone so we will not increase this dose further. 3.    Continue alprazolam at bedtime.   Zofran when necessary nausea and vomiting. 4.    We will hold off on a trigger point injections as pain is better but he will call if he needs one. 5.    Return to see me in 58month or sooner if there are new or worsening neurologic symptoms.   Geovanny Sartin A. SFelecia Shelling MD, PUs [facility]68/04/5725 82:03AM Certified in Neurology, Clinical Neurophysiology, Sleep Medicine, Pain Medicine and Neuroimaging  [facility] [address] [place]iceville[place]outhview [place] [phone]

## 2018-02-21 ENCOUNTER — Encounter: Payer: Self-pay | Admitting: Neurology

## 2018-02-21 ENCOUNTER — Other Ambulatory Visit: Payer: Self-pay

## 2018-02-21 ENCOUNTER — Ambulatory Visit (INDEPENDENT_AMBULATORY_CARE_PROVIDER_SITE_OTHER): Payer: 59 | Admitting: Neurology

## 2018-02-21 VITALS — BP 128/73 | HR 69 | Resp 20 | Ht 72.0 in | Wt 281.0 lb

## 2018-02-21 DIAGNOSIS — I208 Other forms of angina pectoris: Secondary | ICD-10-CM | POA: Diagnosis not present

## 2018-02-21 DIAGNOSIS — I4891 Unspecified atrial fibrillation: Secondary | ICD-10-CM

## 2018-02-21 DIAGNOSIS — F0781 Postconcussional syndrome: Secondary | ICD-10-CM

## 2018-02-21 DIAGNOSIS — G4700 Insomnia, unspecified: Secondary | ICD-10-CM

## 2018-02-21 DIAGNOSIS — E1142 Type 2 diabetes mellitus with diabetic polyneuropathy: Secondary | ICD-10-CM

## 2018-02-21 MED ORDER — TRAZODONE HCL 50 MG PO TABS: 50.0000 mg | ORAL_TABLET | Freq: Every day | ORAL | 11 refills | Status: DC

## 2018-02-21 MED ORDER — ALPRAZOLAM 0.25 MG PO TABS: 0.2500 mg | ORAL_TABLET | Freq: Every evening | ORAL | 5 refills | Status: DC | PRN

## 2018-02-21 NOTE — Patient Instructions (Addendum)
1,   stopped taking nortriptyline. 2.   Trazodone 50 mg nightly (1 pill) 3.   If still having difficulty with insomnia, stop the trazodone and ask your other doctor if you can increase your nighttime gabapentin.

## 2018-02-21 NOTE — Progress Notes (Signed)
[facility]  PATIENT: Matthew [name] DOB: 1945/10/06  REFERRING DOCTOR OR PCP:  Lennette Bihari Via SOURCE: Patient, notes from Dr. Lynelle Doctor, emergency room notes, CT scan reports.  _________________________________   HISTORICAL  CHIEF COMPLAINT:  Chief Complaint  Patient presents with  . Postconcussion Syndrome    Sts. Sts. h/a's, n/v have been completely resolved for the last 3 weeks. Sts.he continues to have trouble going to and staying asleep,despite Xanax and Nortriptyline. Would like to discuss other tx. options/fim  . Insomnia  . Polyneuropathy    HISTORY OF PRESENT ILLNESS:  Matthew [name] Is a 72 year old man with postconcussive syndrome.  Update 02/21/2018: His headaches, nausea and vomiting have resolved.   The anxiety associated with his symptoms is doing better.     He reports difficulty with sleep onset more than sleep maintenance.   He slep better before his head injury last year.    He is on nortriptyline.   He has LBP and is on oxycodone and gabapentin.   He takes gabapentin 300 mg in am and 300 mg at night.     He has CAD, h/o MI and AFib.   He is on amiodarone and Eliquis.    He also has IDDM and very mild polyneuropathy.     Update 11/19/2017: He feels he is doing mildly better but still has headaches and nausea with occasional vomiting (203 times a month).     Zofran has helped the nausea some.    He has pain from the right occiput to the forehead.   The TPI helped a lot with the intensity of the pain.    He feels his gait is slightly better but he has not been exercising much.   He has a cane but has not recently used it.     Sleep is much better since the alprazolam and it is more affordable than temazepam.        Update 08/15/2017:  He reports that he is continuing to experience headaches despite nortriptyline 25 mg. They are at the same frequency and severity. He will get nausea and projectile vomiting that is not always associated with  headache.  These started after the falls.     The nortriptyline has not helped much.    Zofran has not helped much  He has had several falls, one 01/28/2017. The back of his head and another one 03/18/2017 in the left frontal region.  He did not have any hemorrhages with these.   He had a couple other falls too.    N/V has seemed worse since that fall.   Also he seems a little more confused.     He also is quick to get irritable.      The MRI of the brain 05/09/2018 showed generalized cortical atrophy and some chronic buccal vessel ischemic changes. There were no acute findings. He did have chronic microhemorrhages in the right basal ganglia and left cerebellum that are likely be sequela of hypertension  The insomnia is better with temazepam however it is expensive and he would like something else.   Update 04/10/2017:     The TPI last visit helped a lot for a few days   Since last visit, nortriptyline 10 mg was added. He feels the headaches are minimally better (10%). However, we also reduce the trazodone and he feels his sleep is doing worse.   His poor balance is better.     He sleeps more than he used to He  feels unmotivated to get out of bed.      He was unable to complete the MRI due to nausea and claustrophobia. He had another fall while urinating (he needs to strain due to a large prostate) on 03/18/2017 when he became weak and dizzy and he hit the left frontal region on the sink. The CT scan 03/19/2027 was personally reviewed. It did not show any acute intracranial abnormalities. 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      He is on chronic oxycodone 10/325 qid and has constipation  From 03/13/2017: On 01/28/2017, he fell on a slippery floor and landed on his back and the back of the head.   There was no LOC.   Later that night, he  Began to notice a headache and has had one since.     The headache is bi-occipital and radiates to the forehead.    Sometimes, the pain is all over.  Pain is a constant pressure sensation.    He wakes up with pain and goes to bed with a headache.    He takes oxycodone for his back (4/day) but it has not helped the headache much.   Nothing seems to increase or decrease the intensity much.    Vision is blurry at times.     He has had nausea and near-vomiting but nothing has come up.   He has been prescribed Zofran.     He is more indecisive and doesn't want to be alone since the incident and seems cognitively more slowed according to his wife.    He reports being more tired.         He first went to the [place] 01/29/2017 and had CT of the head (no bleed) and xrays of the cervical spine (no fractures).   He woke up one morning in intense pain and felt ;like he was dying'.   He was pacing and couldn't stay still.    His son took him to the ED 02/27/2017.   He went to [facility] ED 02/22/17 [name]) and [place] 02/27/2017.     Besides the headache, back pain has been worse since his fall.   He sees pain management and is on oxycodone.  He has diabetes mellitus.  He has been told he has neuropathy from the diabetes.Marland Kitchen He also has a history of an MI April 29 and had a stent placed. He has had arrhythmias and is on amiodarone.  A CT scan report ([facility]) 02/22/2017 reports cortical and cerebellar atrophy, bilateral cerebellar lacunar infarctions and chronic microvessel ischemic changes. There was no report of any bleed.  REVIEW OF SYSTEMS: Constitutional: No fevers, chills, sweats, or change in appetite Eyes: No visual changes, double vision, eye pain Ear, nose and throat: No hearing loss, ear pain, nasal congestion, sore throat Cardiovascular: Has history of MI and A. fib. Respiratory: No shortness of breath at rest or with exertion.   No wheezes.   He has OSA ans has CPAP GastrointestinaI: No nausea, vomiting, diarrhea, abdominal pain, fecal  incontinence Genitourinary: No dysuria, urinary retention or frequency.  No nocturia. Musculoskeletal: He reports neck and back pain, see above Integumentary: No rash, pruritus, skin lesions Neurological: as above Psychiatric: No depression at this time.  No anxiety Endocrine: No palpitations, diaphoresis, change in appetite, change in weigh or increased thirst Hematologic/Lymphatic: No anemia, purpura, petechiae. Allergic/Immunologic: No itchy/runny eyes, nasal  congestion, recent allergic reactions, rashes  ALLERGIES: Allergies  Allergen Reactions  . Tetanus Toxoids Hives    HOME MEDICATIONS:  Current Outpatient Medications:  .  ALPRAZolam (XANAX) 0.25 MG tablet, Take 1 tablet (0.25 mg total) by mouth at bedtime as needed for anxiety., Disp: 30 tablet, Rfl: 5 .  amiodarone (PACERONE) 200 MG tablet, Take 0.5 tablets (100 mg total) by mouth 2 (two) times daily., Disp: , Rfl:  .  apixaban (ELIQUIS) 5 MG TABS tablet, Take 5 mg by mouth 2 (two) times daily., Disp: , Rfl:  .  buPROPion (WELLBUTRIN XL) 150 MG 24 hr tablet, Take 150 mg by mouth daily., Disp: , Rfl:  .  capsaicin (ZOSTRIX) 0.025 % cream, Apply 1 application topically 2 (two) times daily as needed (for pain). , Disp: , Rfl:  .  cholecalciferol (VITAMIN D) 1000 units tablet, Take 1,000 Units by mouth daily., Disp: , Rfl:  .  clopidogrel (PLAVIX) 75 MG tablet, Take 1 tablet (75 mg total) by mouth daily with breakfast., Disp: 30 tablet, Rfl: 11 .  clotrimazole (LOTRIMIN) 1 % cream, Apply 1 application topically 2 (two) times daily as needed (for rash). , Disp: , Rfl:  .  ferrous sulfate 325 (65 FE) MG tablet, Take 325 mg by mouth 3 (three) times a week. Tuesday, Wednesday and Friday, Disp: , Rfl:  .  finasteride (PROSCAR) 5 MG tablet, Take 5 mg by mouth daily., Disp: , Rfl:  .  gabapentin (NEURONTIN) 300 MG capsule, Take 300 mg by mouth 2 (two) times daily. , Disp: , Rfl:  .  glucose 4 GM chewable tablet, Chew 1 tablet by mouth  as needed for low blood sugar., Disp: , Rfl:  .  insulin aspart (NOVOLOG) 100 UNIT/ML injection, Inject 25 Units into the skin 2 (two) times daily as needed for high blood sugar. 25 units before lunch and 25 units before dinner, Disp: , Rfl:  .  Insulin Detemir (LEVEMIR FLEXPEN) 100 UNIT/ML Pen, Inject 50 Units into the skin 2 (two) times daily. , Disp: , Rfl:  .  isosorbide mononitrate (IMDUR) 60 MG 24 hr tablet, Take 1 tablet (60 mg total) by mouth daily. Hold if blood pressure <100, Disp: 30 tablet, Rfl: 0 .  ketoconazole (NIZORAL) 2 % cream, Apply 1 application topically daily as needed. For rash , Disp: , Rfl:  .  levothyroxine (SYNTHROID, LEVOTHROID) 150 MCG tablet, Take 150 mcg by mouth daily before breakfast., Disp: , Rfl:  .  LIDOCAINE EX, Apply 1 application topically 2 (two) times daily as needed (for pain). , Disp: , Rfl:  .  Magnesium Oxide 420 MG TABS, Take 420 mg by mouth daily. , Disp: , Rfl:  .  MENTHOL-METHYL SALICYLATE EX, Apply 1 application topically as needed (pain)., Disp: , Rfl:  .  mesalamine (CANASA) 1000 MG suppository, Place 1,000 mg rectally at bedtime as needed (ulcerative colitis flares). , Disp: , Rfl:  .  mesalamine (LIALDA) 1.2 g EC tablet, Take 1.2 g by mouth daily as needed (ulcerative colitis flares)., Disp: , Rfl:  .  methylPREDNISolone (MEDROL) 4 MG tablet, Take 4 mg by mouth 2 (two) times daily as needed (gout flares). , Disp: , Rfl:  .  metoprolol succinate (TOPROL-XL) 25 MG 24 hr tablet, Take 12.5 mg by mouth daily., Disp: , Rfl:  .  mupirocin ointment (BACTROBAN) 2 %, Place 1 application into the nose daily as needed (for mersa). , Disp: , Rfl:  .  neomycin-bacitracin-polymyxin (NEOSPORIN) ointment, Apply 1  application topically as needed for wound care., Disp: , Rfl:  .  nitroGLYCERIN (NITROSTAT) 0.4 MG SL tablet, Place 0.4 mg under the tongue every 5 (five) minutes as needed for chest pain (x 3 doses daily). , Disp: , Rfl:  .  nystatin cream (MYCOSTATIN),  Apply 1 application topically daily as needed for dry skin (rash)., Disp: , Rfl:  .  Omega-3 Fatty Acids (FISH OIL) 1000 MG CAPS, Take 2,000 mg by mouth 2 (two) times daily. , Disp: , Rfl:  .  oxyCODONE-acetaminophen (PERCOCET) 10-325 MG per tablet, Take 1 tablet by mouth every 6 (six) hours as needed for pain. , Disp: , Rfl:  .  RABEprazole (ACIPHEX) 20 MG tablet, Take 40 mg by mouth 2 (two) times daily before a meal. , Disp: , Rfl:  .  rosuvastatin (CRESTOR) 40 MG tablet, Take 40 mg by mouth at bedtime. , Disp: , Rfl:  .  silver sulfADIAZINE (SILVADENE) 1 % cream, Apply 1 application topically daily as needed (skin care). , Disp: , Rfl:  .  terbinafine (LAMISIL) 1 % cream, Apply 1 application topically daily as needed (skin care). , Disp: , Rfl:  .  triamcinolone cream (KENALOG) 0.1 %, Apply 1 application topically daily as needed (rash)., Disp: , Rfl:  .  urea 10 % lotion, Apply 1 application topically as needed for dry skin., Disp: , Rfl:  .  traZODone (DESYREL) 50 MG tablet, Take 1 tablet (50 mg total) by mouth at bedtime., Disp: 30 tablet, Rfl: 11  PAST MEDICAL HISTORY: Past Medical History:  Diagnosis Date  . AAA (abdominal aortic aneurysm) ([place])    a. Aortic US 05/2012: 3.9 cm fusiform abdominal aortic aneurysm. F/u recommended 05/2014.  Marland Kitchen Anemia   . Arthritis    DDD  . Atrial fibrillation ([place])    a. Post-op CABG 05/2009. b. Re-identified 12/2013.   Marland Kitchen CAD (coronary artery disease)    a. s/p CABGx4 05/2009. 10/10/16 PCI with DES x1 SVG-->OM2/OM3  . Chronic constipation   . CKD (chronic kidney disease), stage III ([place])   . Cocaine abuse in remission ([place])   . Colon polyps   . Depression   . Diabetes mellitus   . Empyema lung ([place])    a. PNA 02/2011 c/b empyema requiring chest tube, drainage of pleural effusion and decortication.  . Enlarged prostate   . Frequent falls   . GERD (gastroesophageal reflux disease)   . Headache   . Hepatic steatosis   . Hyperlipidemia   .  Hypertension   . Hypothyroidism   . Iron deficiency anemia   . LAFB (left anterior fascicular block)   . Lung cancer ([place])    a. Tx with interleukin in 2002 per patient.  . Myocardial infarction ([place])    10/08/16  . Nodule of left lung    a. Cavitary nodules 02/2011, f/u recommended.  . Obesity   . Paroxysmal supraventricular tachycardia ([place])    a. While inpatient 02/2011 sick with empyema - wide appearace due to RBBB.  . Pneumonia   . Post concussion syndrome   . RBBB   . Renal cell carcinoma    a. s/p R nephrectomy 2002. Reported pulmonary mets per note at that time.  . Scrotal abscess    a. 2012 2/2 Streptococcus group C, status post incision and drainage.  . Sleep apnea    wears CPAP  . 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  . Vitamin D deficiency   .  Wears dentures     PAST SURGICAL HISTORY: Past Surgical History:  Procedure Laterality Date  . Bronchoscopy, right video-assisted thoracoscopy, minithoracotomy, drainage of pleural effusion, and decortication.  02/26/2011   [facility]  . CABG x 4  05/13/2009   [facility]  . CARDIAC SURGERY    . COLONOSCOPY W/ BIOPSIES AND POLYPECTOMY    . CORONARY STENT INTERVENTION N/A 10/10/2016   Procedure: Coronary Stent Intervention;  Surgeon: Troy Sine, MD;  Location: [facility];  Service: Cardiovascular;  Laterality: N/A;  . HEMORRHOID SURGERY    . HERNIA REPAIR    . Incision, drainage and debridement of perineum and  scrotum    . Irrigation and debridement of scrotal wound and closure of scrotal wound.    Marland Kitchen LEFT HEART CATH AND CORS/GRAFTS ANGIOGRAPHY N/A 10/10/2016   Procedure: Left Heart Cath and Cors/Grafts Angiography;  Surgeon: Troy Sine, MD;  Location: [facility];  Service: Cardiovascular;  Laterality: N/A;  . MULTIPLE TOOTH EXTRACTIONS    . RADIOLOGY WITH ANESTHESIA N/A 05/09/2017   Procedure: MRI WITH ANESTHESIA   BRAIN WITHOUT CONTRAST;  Surgeon: Radiologist, Medication, MD;   Location: [facility];  Service: Radiology;  Laterality: N/A;  . Right radical nephrectomy.    . TOOTH EXTRACTION      FAMILY HISTORY: Family History  Problem Relation Age of Onset  . Heart disease Father   . Alzheimer's disease Mother   . Pneumonia Unknown   . Colon cancer Neg Hx   . Esophageal cancer Neg Hx   . Stomach cancer Neg Hx   . Rectal cancer Neg Hx     SOCIAL HISTORY:  Social History   Socioeconomic History  . Marital status: Married    Spouse name: Not on file  . Number of children: 4  . Years of education: Not on file  . Highest education level: Not on file  Occupational History  . Occupation: CAR SERVICE    Employer: [employer]  . Financial resource strain: Not on file  . Food insecurity:    Worry: Not on file    Inability: Not on file  . Transportation needs:    Medical: Not on file    Non-medical: Not on file  Tobacco Use  . Smoking status: Never Smoker  . Smokeless tobacco: Never Used  Substance and Sexual Activity  . Alcohol use: No  . Drug use: Yes    Types: Cocaine    Comment: stopped cocaine 2 and 1/2 years ago  . Sexual activity: Not on file  Lifestyle  . Physical activity:    Days per week: Not on file    Minutes per session: Not on file  . Stress: Not on file  Relationships  . Social connections:    Talks on phone: Not on file    Gets together: Not on file    Attends religious service: Not on file    Active member of club or organization: Not on file    Attends meetings of clubs or organizations: Not on file    Relationship status: Not on file  . Intimate partner violence:    Fear of current or ex partner: Not on file    Emotionally abused: Not on file    Physically abused: Not on file    Forced sexual activity: Not on file  Other Topics Concern  . Not on file  Social History Narrative  . Not on file     PHYSICAL EXAM  Vitals:   02/21/18 0817  BP: 128/73  Pulse: 69  Resp: 20  Weight: 281 lb (127.5 kg)   Height: 6' (1.829 m)    Body mass index is 38.11 kg/m.   General: The patient is well-developed and well-nourished and in no acute distress.    He is tender over the splenius capitis muscles/occiput. Range of motion is fairly normal in the neck.   Neurologic Exam  Mental status: The patient is alert and oriented x 3 at the time of the examination. The patient has apparent normal recent and remote memory, with an apparently normal attention span and concentration ability.   Speech is normal.  Cranial nerves: Extraocular movements are full.  Facial strength and sensation was normal.  Trapezius strength was normal.  No obvious hearing deficits are noted.  Motor:  Muscle bulk is normal.   Tone is normal. Strength is  5/5 in all muscles except 4+/5 EHL.   Sensory:    He had normal sensation in the arms.  There is normal sensation proximally in the legs but reduced vibration and touch sensation at the toes.  Sensation appeared to be normal at the ankles and above.  Coordination: Cerebellar testing shows good finger-nose-finger and okay heel-to-shin...  Gait and station: Station is normal.   Gait is arthrtic. Tandem gait is wide.    Romberg is negative..   Reflexes: Deep tendon reflexes are symmetric and normal bilaterally in arms, absent at the ankles.    DIAGNOSTIC DATA (LABS, IMAGING, TESTING) - I reviewed patient records, labs, notes, testing and imaging myself where available.  Lab Results  Component Value Date   WBC 8.4 07/28/2017   HGB 14.8 07/28/2017   HCT 43.9 07/28/2017   MCV 97.6 07/28/2017   PLT 232 07/28/2017      Component Value Date/Time   NA 135 07/28/2017 2345   K 4.8 07/28/2017 2345   CL 102 07/28/2017 2345   CO2 21 (L) 07/28/2017 2345   GLUCOSE 134 (H) 07/28/2017 2345   BUN 18 07/28/2017 2345   CREATININE 1.79 (H) 07/28/2017 2345   CALCIUM 9.2 07/28/2017 2345   PROT 6.8 07/28/2017 2345   ALBUMIN 3.2 (L) 07/28/2017 2345   AST 35 07/28/2017 2345   ALT 34  07/28/2017 2345   ALT 79 (H) 10/20/2014 1225   ALKPHOS 35 (L) 07/28/2017 2345   BILITOT 0.8 07/28/2017 2345   GFRNONAA 36 (L) 07/28/2017 2345   GFRAA 42 (L) 07/28/2017 2345   Lab Results  Component Value Date   CHOL 194 10/09/2016   HDL 28 (L) 10/09/2016   LDLCALC 113 (H) 10/09/2016   TRIG 265 (H) 10/09/2016   CHOLHDL 6.9 10/09/2016   Lab Results  Component Value Date   HGBA1C 8.6 (H) 05/09/2017   No results found for: VITAMINB12 Lab Results  Component Value Date   TSH 2.090 10/08/2016       ASSESSMENT AND PLAN  Post concussion syndrome  Diabetic polyneuropathy associated with type 2 diabetes mellitus (HCC)  Insomnia, unspecified type  Atrial fibrillation with rapid ventricular response ([place])    1.    Trazodone 50 mg nightly.  Stop the nortriptyline 25 mg nightly. He is on amiodarone so we will not increase the trazodone further (QT prolongation). 2.    Continue alprazolam at bedtime.   Consider increasing gabapentin to 600 mg nightly if insomnia still an issue.    He takes 30-40 mg oxycodone daily so I'd prefer not to go up further on the  alprazolam though that might be better option than increasing trazodone further.    4.   Return to see me in  6 months or sooner if there are new or worsening neurologic symptoms.   Edla Para A. Felecia Shelling, MD, [facility] 1/89/8421, 0:31 AM Certified in Neurology, Clinical Neurophysiology, Sleep Medicine, Pain Medicine and Neuroimaging  [facility] [address], [place], [place] [address] [phone]

## 2018-04-19 ENCOUNTER — Encounter (HOSPITAL_COMMUNITY): Payer: Self-pay

## 2018-04-19 ENCOUNTER — Emergency Department (HOSPITAL_COMMUNITY)
Admission: EM | Admit: 2018-04-19 | Discharge: 2018-04-19 | Disposition: A | Discharge status: Home / Self Care | Payer: 59 | Attending: Emergency Medicine | Admitting: Emergency Medicine

## 2018-04-19 DIAGNOSIS — H5711 Ocular pain, right eye: Secondary | ICD-10-CM | POA: Diagnosis present

## 2018-04-19 DIAGNOSIS — E039 Hypothyroidism, unspecified: Secondary | ICD-10-CM | POA: Diagnosis not present

## 2018-04-19 DIAGNOSIS — X58XXXA Exposure to other specified factors, initial encounter: Secondary | ICD-10-CM | POA: Diagnosis not present

## 2018-04-19 DIAGNOSIS — Z85118 Personal history of other malignant neoplasm of bronchus and lung: Secondary | ICD-10-CM | POA: Diagnosis not present

## 2018-04-19 DIAGNOSIS — N183 Chronic kidney disease, stage 3 (moderate): Secondary | ICD-10-CM | POA: Diagnosis not present

## 2018-04-19 DIAGNOSIS — Y929 Unspecified place or not applicable: Secondary | ICD-10-CM | POA: Diagnosis not present

## 2018-04-19 DIAGNOSIS — I4891 Unspecified atrial fibrillation: Secondary | ICD-10-CM | POA: Diagnosis not present

## 2018-04-19 DIAGNOSIS — S0501XA Injury of conjunctiva and corneal abrasion without foreign body, right eye, initial encounter: Secondary | ICD-10-CM | POA: Diagnosis not present

## 2018-04-19 DIAGNOSIS — Y999 Unspecified external cause status: Secondary | ICD-10-CM | POA: Insufficient documentation

## 2018-04-19 DIAGNOSIS — E1122 Type 2 diabetes mellitus with diabetic chronic kidney disease: Secondary | ICD-10-CM | POA: Insufficient documentation

## 2018-04-19 DIAGNOSIS — I2510 Atherosclerotic heart disease of native coronary artery without angina pectoris: Secondary | ICD-10-CM | POA: Diagnosis not present

## 2018-04-19 DIAGNOSIS — Y939 Activity, unspecified: Secondary | ICD-10-CM | POA: Insufficient documentation

## 2018-04-19 DIAGNOSIS — I129 Hypertensive chronic kidney disease with stage 1 through stage 4 chronic kidney disease, or unspecified chronic kidney disease: Secondary | ICD-10-CM | POA: Diagnosis not present

## 2018-04-19 DIAGNOSIS — Z951 Presence of aortocoronary bypass graft: Secondary | ICD-10-CM | POA: Diagnosis not present

## 2018-04-19 MED ORDER — TETRACAINE HCL 0.5 % OP SOLN
2.0000 [drp] | Freq: Once | OPHTHALMIC | Status: AC
  Administered 2018-04-19: 2 [drp] via OPHTHALMIC
  Filled 2018-04-19: qty 4

## 2018-04-19 MED ORDER — FLUORESCEIN SODIUM 1 MG OP STRP
1.0000 | ORAL_STRIP | Freq: Once | OPHTHALMIC | Status: AC
  Administered 2018-04-19: 1 via OPHTHALMIC
  Filled 2018-04-19: qty 1

## 2018-04-19 MED ORDER — ERYTHROMYCIN 5 MG/GM OP OINT: TOPICAL_OINTMENT | OPHTHALMIC | 0 refills | Status: DC

## 2018-04-19 NOTE — ED Triage Notes (Signed)
Pt presents with c/o foreign object in his right eye. Pt reports that he believes he got something in his eye last night, unsure of what it is. Pt reports he cannot see out of that eye and is c/o redness and swelling. Pt reports significant pain in that eye.

## 2018-04-19 NOTE — ED Notes (Signed)
MD at bedside assessing patient  

## 2018-04-19 NOTE — ED Provider Notes (Signed)
Emergency Department Provider Note   I have reviewed the triage vital signs and the nursing notes.   HISTORY  Chief Complaint Foreign Body in Eye   HPI Matthew [name] is a 72 y.o. male with PMH of AAA, CAD, CKD, and DM presents to the emergency department for evaluation of severe right eye pain.  Patient denies any vision loss.  He is having photophobia.  Patient states he feels like there is something in the eye that is scraping it.  He denies any known injury to the eye.  When he is able to open the eye he denies any blurry vision or double vision.  He does not wear contact lenses. Pain is severe, constant, and without radiation.    Past Medical History:  Diagnosis Date  . AAA (abdominal aortic aneurysm) ([place])    a. Aortic US 05/2012: 3.9 cm fusiform abdominal aortic aneurysm. F/u recommended 05/2014.  Marland Kitchen Anemia   . Arthritis    DDD  . Atrial fibrillation ([place])    a. Post-op CABG 05/2009. b. Re-identified 12/2013.   Marland Kitchen CAD (coronary artery disease)    a. s/p CABGx4 05/2009. 10/10/16 PCI with DES x1 SVG-->OM2/OM3  . Chronic constipation   . CKD (chronic kidney disease), stage III ([place])   . Cocaine abuse in remission ([place])   . Colon polyps   . Depression   . Diabetes mellitus   . Empyema lung ([place])    a. PNA 02/2011 c/b empyema requiring chest tube, drainage of pleural effusion and decortication.  . Enlarged prostate   . Frequent falls   . GERD (gastroesophageal reflux disease)   . Headache   . Hepatic steatosis   . Hyperlipidemia   . Hypertension   . Hypothyroidism   . Iron deficiency anemia   . LAFB (left anterior fascicular block)   . Lung cancer ([place])    a. Tx with interleukin in 2002 per patient.  . Myocardial infarction ([place])    10/08/16  . Nodule of left lung    a. Cavitary nodules 02/2011, f/u recommended.  . Obesity   . Paroxysmal supraventricular tachycardia ([place])    a. While inpatient 02/2011 sick with empyema - wide appearace due to RBBB.  . Pneumonia    . Post concussion syndrome   . RBBB   . Renal cell carcinoma    a. s/p R nephrectomy 2002. Reported pulmonary mets per note at that time.  . Scrotal abscess    a. 2012 2/2 Streptococcus group C, status post incision and drainage.  . Sleep apnea    wears CPAP  . 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  . Vitamin D deficiency   . Wears dentures     Patient Active Problem List   Diagnosis Date Noted  . Type 2 diabetes mellitus with vascular disease ([place]) 07/29/2017  . Post concussion syndrome 03/13/2017  . Post-concussion headache 03/13/2017  . Neck pain 03/13/2017  . Insomnia 03/13/2017  . Diabetes, polyneuropathy ([place]) 03/13/2017  . Unstable angina ([place])   . NSTEMI (non-ST elevated myocardial infarction) ([place]) 10/08/2016  . Atrial fibrillation with RVR ([place]) 01/26/2016  . Atrial fibrillation with rapid ventricular response ([place]) 01/26/2016  . PAF (paroxysmal atrial fibrillation) ([place]) 12/21/2013  . Chronic abdominal pain 12/21/2013  . CKD (chronic kidney disease), stage III ([place]) 12/21/2013  . H/O: lung cancer 12/21/2013  . LV dysfunction 12/21/2013  . RBBB   . LAFB (left anterior fascicular block)   .  AAA (abdominal aortic aneurysm) ([place])   . Chest pain 12/20/2013  . Mixed hyperlipidemia 10/14/2013  . Obesity, unspecified 10/14/2013  . Right Empyema    .  Left upper lobe lesion or nodule   . Hypertension   . CAD (coronary artery disease)   . Renal cell carcinoma ([place])   . [place]   . Paroxysmal supraventricular tachycardia ([place])   . Hypothyroidism     Past Surgical History:  Procedure Laterality Date  . Bronchoscopy, right video-assisted thoracoscopy, minithoracotomy, drainage of pleural effusion, and decortication.  02/26/2011   [facility]  . CABG x 4  05/13/2009   [facility]  . CARDIAC SURGERY    . COLONOSCOPY W/ BIOPSIES AND POLYPECTOMY    . CORONARY STENT INTERVENTION N/A 10/10/2016   Procedure: Coronary Stent Intervention;  Surgeon:  Troy Sine, MD;  Location: [facility];  Service: Cardiovascular;  Laterality: N/A;  . HEMORRHOID SURGERY    . HERNIA REPAIR    . Incision, drainage and debridement of perineum and  scrotum    . Irrigation and debridement of scrotal wound and closure of scrotal wound.    Marland Kitchen LEFT HEART CATH AND CORS/GRAFTS ANGIOGRAPHY N/A 10/10/2016   Procedure: Left Heart Cath and Cors/Grafts Angiography;  Surgeon: Troy Sine, MD;  Location: [facility];  Service: Cardiovascular;  Laterality: N/A;  . MULTIPLE TOOTH EXTRACTIONS    . RADIOLOGY WITH ANESTHESIA N/A 05/09/2017   Procedure: MRI WITH ANESTHESIA   BRAIN WITHOUT CONTRAST;  Surgeon: Radiologist, Medication, MD;  Location: [facility];  Service: Radiology;  Laterality: N/A;  . Right radical nephrectomy.    . TOOTH EXTRACTION      Allergies Tetanus toxoids  Family History  Problem Relation Age of Onset  . Heart disease Father   . Alzheimer's disease Mother   . Pneumonia Unknown   . Colon cancer Neg Hx   . Esophageal cancer Neg Hx   . Stomach cancer Neg Hx   . Rectal cancer Neg Hx     Social History Social History   Tobacco Use  . Smoking status: Never Smoker  . Smokeless tobacco: Never Used  Substance Use Topics  . Alcohol use: No  . Drug use: Yes    Types: Cocaine    Comment: stopped cocaine 2 and 1/2 years ago    Review of Systems  Constitutional: No fever/chills Eyes: No visual changes. Positive right eye pain.  ENT: No sore throat. Cardiovascular: Denies chest pain. Respiratory: Denies shortness of breath. Gastrointestinal: No abdominal pain.  No nausea, no vomiting.  No diarrhea.  No constipation. Genitourinary: Negative for dysuria. Musculoskeletal: Negative for back pain. Skin: Negative for rash. Neurological: Negative for headaches, focal weakness or numbness.  10-point ROS otherwise negative.  ____________________________________________   PHYSICAL EXAM:  VITAL SIGNS: ED Triage Vitals [04/19/18  0757]  Enc Vitals Group     BP (!) 143/83     Pulse Rate (!) 55     Resp 18     Temp 97.8 F (36.6 C)     Temp Source Oral     SpO2 100 %     Weight 272 lb (123.4 kg)     Height 6' (1.829 m)     Pain Score 10   Constitutional: Alert and oriented. Well appearing and in no acute distress. Eyes: Conjunctivae are normal. Patient with photophobia. Normal EOM. PERRL. 3-o-clock corneal abrasion appreciated just lateral to the iris. Not in the visual axis. IOP R: 15 and L:  15.  Head: Atraumatic. Nose: No congestion/rhinnorhea. Mouth/Throat: Mucous membranes are moist.  Neck: No stridor.  Cardiovascular: Normal rate, regular rhythm. Respiratory: Normal respiratory effort.  Gastrointestinal: No distention.  Musculoskeletal: No lower extremity tenderness nor edema. No gross deformities of extremities. Neurologic:  Normal speech and language. No gross focal neurologic deficits are appreciated.  Skin:  Skin is warm, dry and intact. No rash noted.  ____________________________________________  RADIOLOGY  None ____________________________________________   PROCEDURES  Procedure(s) performed:   Procedures  None ____________________________________________   INITIAL IMPRESSION / ASSESSMENT AND PLAN / ED COURSE  Pertinent labs & imaging results that were available during my care of the patient were reviewed by me and considered in my medical decision making (see chart for details).  To the emergency department for evaluation of foreign body sensation in the right eye.  I do not appreciate a foreign body either visually, by floor seen, on lid eversion.  There does appear to be a corneal abrasion at the 3 o'clock position just lateral to the iris.  Patient felt significantly better after tetracaine.   9:05 AM Patient's intraocular pressure was noted above.  No concern for acute angle glaucoma or other emergent eye condition.  Plan for outpatient ophthalmology follow-up next week.   Instructed patient on the use of erythromycin ointment.  He has oxycodone already at home for pain.  Plan for discharge.  At this time, I do not feel there is any life-threatening condition present. I have reviewed and discussed all results (EKG, imaging, lab, urine as appropriate), exam findings with patient. I have reviewed nursing notes and appropriate previous records.  I feel the patient is safe to be discharged home without further emergent workup. Discussed usual and customary return precautions. Patient and family (if present) verbalize understanding and are comfortable with this plan.  Patient will follow-up with their primary care provider. If they do not have a primary care provider, information for follow-up has been provided to them. All questions have been answered.  ____________________________________________  FINAL CLINICAL IMPRESSION(S) / ED DIAGNOSES  Final diagnoses:  Abrasion of right cornea, initial encounter     MEDICATIONS GIVEN DURING THIS VISIT:  Medications  tetracaine (PONTOCAINE) 0.5 % ophthalmic solution 2 drop (2 drops Right Eye Given 04/19/18 0850)  fluorescein ophthalmic strip 1 strip (1 strip Right Eye Given 04/19/18 0850)     NEW OUTPATIENT MEDICATIONS STARTED DURING THIS VISIT:  New Prescriptions   ERYTHROMYCIN OPHTHALMIC OINTMENT    Place a 1/2 inch ribbon of ointment into the right lower eyelid 4 times per day for 7 days.    Note:  This document was prepared using Dragon voice recognition software and may include unintentional dictation errors.  Nanda Quinton, MD Emergency Medicine    Long, [facility], MD 04/19/18 [phone]

## 2018-04-19 NOTE — Discharge Instructions (Signed)
You were seen in the ED today with a corneal abrasion. Use the erythromycin as directed and follow up with the eye doctor early next week if symptoms are worsening. If your symptoms suddenly become much worse or you lose vision in the eye you will need to return to the ED.

## 2018-04-23 DIAGNOSIS — H04123 Dry eye syndrome of bilateral lacrimal glands: Secondary | ICD-10-CM | POA: Diagnosis not present

## 2018-04-23 DIAGNOSIS — H16103 Unspecified superficial keratitis, bilateral: Secondary | ICD-10-CM | POA: Diagnosis not present

## 2018-04-23 DIAGNOSIS — H2513 Age-related nuclear cataract, bilateral: Secondary | ICD-10-CM | POA: Diagnosis not present

## 2018-04-23 DIAGNOSIS — E119 Type 2 diabetes mellitus without complications: Secondary | ICD-10-CM | POA: Diagnosis not present

## 2018-04-26 DIAGNOSIS — R05 Cough: Secondary | ICD-10-CM | POA: Diagnosis not present

## 2018-06-10 ENCOUNTER — Other Ambulatory Visit: Payer: Self-pay | Admitting: Internal Medicine

## 2018-06-10 ENCOUNTER — Other Ambulatory Visit: Payer: Self-pay | Admitting: Radiology

## 2018-06-10 DIAGNOSIS — R0989 Other specified symptoms and signs involving the circulatory and respiratory systems: Secondary | ICD-10-CM

## 2018-06-10 DIAGNOSIS — R5383 Other fatigue: Secondary | ICD-10-CM

## 2018-06-17 ENCOUNTER — Ambulatory Visit
Admission: RE | Admit: 2018-06-17 | Discharge: 2018-06-17 | Disposition: A | Discharge status: Home / Self Care | Payer: 59 | Source: Ambulatory Visit | Attending: Internal Medicine | Admitting: Internal Medicine

## 2018-06-17 ENCOUNTER — Other Ambulatory Visit: Payer: Self-pay | Admitting: Internal Medicine

## 2018-06-17 DIAGNOSIS — R5383 Other fatigue: Secondary | ICD-10-CM

## 2018-06-17 DIAGNOSIS — R0989 Other specified symptoms and signs involving the circulatory and respiratory systems: Secondary | ICD-10-CM

## 2018-06-17 DIAGNOSIS — Z0389 Encounter for observation for other suspected diseases and conditions ruled out: Secondary | ICD-10-CM | POA: Diagnosis not present

## 2018-07-02 ENCOUNTER — Other Ambulatory Visit: Payer: Self-pay | Admitting: Interventional Radiology

## 2018-07-27 ENCOUNTER — Encounter (HOSPITAL_COMMUNITY): Payer: Self-pay | Admitting: Emergency Medicine

## 2018-07-27 ENCOUNTER — Emergency Department (HOSPITAL_COMMUNITY): Payer: 59

## 2018-07-27 ENCOUNTER — Other Ambulatory Visit: Payer: Self-pay

## 2018-07-27 ENCOUNTER — Emergency Department (HOSPITAL_COMMUNITY)
Admission: EM | Admit: 2018-07-27 | Discharge: 2018-07-27 | Disposition: A | Discharge status: Home / Self Care | Payer: 59 | Attending: Emergency Medicine | Admitting: Emergency Medicine

## 2018-07-27 DIAGNOSIS — E039 Hypothyroidism, unspecified: Secondary | ICD-10-CM | POA: Diagnosis not present

## 2018-07-27 DIAGNOSIS — Z794 Long term (current) use of insulin: Secondary | ICD-10-CM | POA: Diagnosis not present

## 2018-07-27 DIAGNOSIS — I4510 Unspecified right bundle-branch block: Secondary | ICD-10-CM | POA: Diagnosis not present

## 2018-07-27 DIAGNOSIS — E119 Type 2 diabetes mellitus without complications: Secondary | ICD-10-CM | POA: Insufficient documentation

## 2018-07-27 DIAGNOSIS — R079 Chest pain, unspecified: Secondary | ICD-10-CM | POA: Diagnosis not present

## 2018-07-27 DIAGNOSIS — I2510 Atherosclerotic heart disease of native coronary artery without angina pectoris: Secondary | ICD-10-CM | POA: Diagnosis not present

## 2018-07-27 DIAGNOSIS — Z79899 Other long term (current) drug therapy: Secondary | ICD-10-CM | POA: Diagnosis not present

## 2018-07-27 DIAGNOSIS — Z7901 Long term (current) use of anticoagulants: Secondary | ICD-10-CM | POA: Insufficient documentation

## 2018-07-27 DIAGNOSIS — G4700 Insomnia, unspecified: Secondary | ICD-10-CM | POA: Diagnosis not present

## 2018-07-27 DIAGNOSIS — I129 Hypertensive chronic kidney disease with stage 1 through stage 4 chronic kidney disease, or unspecified chronic kidney disease: Secondary | ICD-10-CM | POA: Diagnosis not present

## 2018-07-27 DIAGNOSIS — Z7902 Long term (current) use of antithrombotics/antiplatelets: Secondary | ICD-10-CM | POA: Diagnosis not present

## 2018-07-27 DIAGNOSIS — N183 Chronic kidney disease, stage 3 (moderate): Secondary | ICD-10-CM | POA: Insufficient documentation

## 2018-07-27 DIAGNOSIS — R0602 Shortness of breath: Secondary | ICD-10-CM | POA: Diagnosis not present

## 2018-07-27 LAB — BASIC METABOLIC PANEL
Anion gap: 11 (ref 5–15)
BUN: 17 mg/dL (ref 8–23)
CHLORIDE: 97 mmol/L — AB (ref 98–111)
CO2: 27 mmol/L (ref 22–32)
Calcium: 9.3 mg/dL (ref 8.9–10.3)
Creatinine, Ser: 1.58 mg/dL — ABNORMAL HIGH (ref 0.61–1.24)
GFR calc Af Amer: 49 mL/min — ABNORMAL LOW (ref >60)
GFR calc non Af Amer: 42 mL/min — ABNORMAL LOW (ref >60)
Glucose, Bld: 303 mg/dL — ABNORMAL HIGH (ref 70–99)
Potassium: 3.7 mmol/L (ref 3.5–5.1)
Sodium: 135 mmol/L (ref 135–145)

## 2018-07-27 LAB — CBC
HCT: 43.9 % (ref 39.0–52.0)
Hemoglobin: 13.8 g/dL (ref 13.0–17.0)
MCH: 30 pg (ref 26.0–34.0)
MCHC: 31.4 g/dL (ref 30.0–36.0)
MCV: 95.4 fL (ref 80.0–100.0)
Platelets: 194 10*3/uL (ref 150–400)
RBC: 4.6 MIL/uL (ref 4.22–5.81)
RDW: 13 % (ref 11.5–15.5)
WBC: 8.1 10*3/uL (ref 4.0–10.5)
nRBC: 0 % (ref 0.0–0.2)

## 2018-07-27 LAB — I-STAT TROPONIN, ED: Troponin i, poc: 0.01 ng/mL (ref 0.00–0.08)

## 2018-07-27 MED ORDER — SODIUM CHLORIDE 0.9% FLUSH
3.0000 mL | Freq: Once | INTRAVENOUS | Status: AC
  Administered 2018-07-27: 3 mL via INTRAVENOUS

## 2018-07-27 MED ORDER — ACETAMINOPHEN 325 MG PO TABS
650.0000 mg | ORAL_TABLET | Freq: Once | ORAL | Status: AC
  Administered 2018-07-27: 650 mg via ORAL
  Filled 2018-07-27: qty 2

## 2018-07-27 NOTE — Discharge Instructions (Signed)
The testing today did not show any serious problems.  It is safe to increase your trazodone to 100 mg at night if needed to help you sleep.  We are supplying some additional instructions about how to help with sleeplessness.  Follow-up with your primary care doctor as needed for problems.

## 2018-07-27 NOTE — ED Provider Notes (Signed)
[facility] DEPT Provider Note   CSN: [id number] Arrival date & time: 07/27/18  0731     History   Chief Complaint Chief Complaint  Patient presents with  . Chest Pain    HPI Matthew [name] is a 73 y.o. male.  HPI   He presents for evaluation insomnia for 2 days days.  Recently, his PCP discontinued his amiodarone, because of "a toxic" problem.Marland Kitchen  He is scheduled for cardiac ablation soon, since he is intolerant of amiodarone.  He is not sure what type of cardiac arrhythmia he has.  He admits that he is concerned about the upcoming test and sometimes feels depressed.  He is here with his wife who gives additional history.  Patient has been losing weight, is down about 20 pounds from his baseline.  He is decreased appetite recently, because he is just not hungry.  There is been no fever, chills, cough, persistent chest pain or dizziness.  He has mild anterior chest pain that comes and goes.  He denies diaphoresis, nausea or vomiting.  There are no other known modifying factors.  Past Medical History:  Diagnosis Date  . AAA (abdominal aortic aneurysm) ([place])    a. Aortic US 05/2012: 3.9 cm fusiform abdominal aortic aneurysm. F/u recommended 05/2014.  Marland Kitchen Anemia   . Arthritis    DDD  . Atrial fibrillation ([place])    a. Post-op CABG 05/2009. b. Re-identified 12/2013.   Marland Kitchen CAD (coronary artery disease)    a. s/p CABGx4 05/2009. 10/10/16 PCI with DES x1 SVG-->OM2/OM3  . Chronic constipation   . CKD (chronic kidney disease), stage III ([place])   . Cocaine abuse in remission ([place])   . Colon polyps   . Depression   . Diabetes mellitus   . Empyema lung ([place])    a. PNA 02/2011 c/b empyema requiring chest tube, drainage of pleural effusion and decortication.  . Enlarged prostate   . Frequent falls   . GERD (gastroesophageal reflux disease)   . Headache   . Hepatic steatosis   . Hyperlipidemia   . Hypertension   . Hypothyroidism   . Iron deficiency anemia   .  LAFB (left anterior fascicular block)   . Lung cancer ([place])    a. Tx with interleukin in 2002 per patient.  . Myocardial infarction ([place])    10/08/16  . Nodule of left lung    a. Cavitary nodules 02/2011, f/u recommended.  . Obesity   . Paroxysmal supraventricular tachycardia ([place])    a. While inpatient 02/2011 sick with empyema - wide appearace due to RBBB.  . Pneumonia   . Post concussion syndrome   . RBBB   . Renal cell carcinoma    a. s/p R nephrectomy 2002. Reported pulmonary mets per note at that time.  . Scrotal abscess    a. 2012 2/2 Streptococcus group C, status post incision and drainage.  . Sleep apnea    wears CPAP  . 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  . Vitamin D deficiency   . Wears dentures     Patient Active Problem List   Diagnosis Date Noted  . Type 2 diabetes mellitus with vascular disease ([place]) 07/29/2017  . Post concussion syndrome 03/13/2017  . Post-concussion headache 03/13/2017  . Neck pain 03/13/2017  . Insomnia 03/13/2017  . Diabetes, polyneuropathy ([place]) 03/13/2017  . Unstable angina ([place])   . NSTEMI (non-ST elevated myocardial infarction) ([place]) 10/08/2016  . Atrial  fibrillation with RVR ([place]) 01/26/2016  . Atrial fibrillation with rapid ventricular response ([place]) 01/26/2016  . PAF (paroxysmal atrial fibrillation) ([place]) 12/21/2013  . Chronic abdominal pain 12/21/2013  . CKD (chronic kidney disease), stage III ([place]) 12/21/2013  . H/O: lung cancer 12/21/2013  . LV dysfunction 12/21/2013  . RBBB   . LAFB (left anterior fascicular block)   . AAA (abdominal aortic aneurysm) ([place])   . Chest pain 12/20/2013  . Mixed hyperlipidemia 10/14/2013  . Obesity, unspecified 10/14/2013  . Right Empyema    .  Left upper lobe lesion or nodule   . Hypertension   . CAD (coronary artery disease)   . Renal cell carcinoma ([place])   . [place]   . Paroxysmal supraventricular tachycardia ([place])   . Hypothyroidism     Past Surgical  History:  Procedure Laterality Date  . Bronchoscopy, right video-assisted thoracoscopy, minithoracotomy, drainage of pleural effusion, and decortication.  02/26/2011   [facility]  . CABG x 4  05/13/2009   [facility]  . CARDIAC SURGERY    . COLONOSCOPY W/ BIOPSIES AND POLYPECTOMY    . CORONARY STENT INTERVENTION N/A 10/10/2016   Procedure: Coronary Stent Intervention;  Surgeon: Troy Sine, MD;  Location: [facility];  Service: Cardiovascular;  Laterality: N/A;  . HEMORRHOID SURGERY    . HERNIA REPAIR    . Incision, drainage and debridement of perineum and  scrotum    . Irrigation and debridement of scrotal wound and closure of scrotal wound.    Marland Kitchen LEFT HEART CATH AND CORS/GRAFTS ANGIOGRAPHY N/A 10/10/2016   Procedure: Left Heart Cath and Cors/Grafts Angiography;  Surgeon: Troy Sine, MD;  Location: [facility];  Service: Cardiovascular;  Laterality: N/A;  . MULTIPLE TOOTH EXTRACTIONS    . RADIOLOGY WITH ANESTHESIA N/A 05/09/2017   Procedure: MRI WITH ANESTHESIA   BRAIN WITHOUT CONTRAST;  Surgeon: Radiologist, Medication, MD;  Location: [facility];  Service: Radiology;  Laterality: N/A;  . Right radical nephrectomy.    . TOOTH EXTRACTION          Home Medications    Prior to Admission medications   Medication Sig Start Date End Date Taking? Authorizing Provider  ALPRAZolam (XANAX) 0.25 MG tablet Take 1 tablet (0.25 mg total) by mouth at bedtime as needed for anxiety. 02/21/18   Sater, Nanine Means, MD  amiodarone (PACERONE) 200 MG tablet Take 0.5 tablets (100 mg total) by mouth 2 (two) times daily. 07/29/17   Annita Brod, MD  apixaban (ELIQUIS) 5 MG TABS tablet Take 5 mg by mouth 2 (two) times daily.    [provider]  buPROPion (WELLBUTRIN XL) 150 MG 24 hr tablet Take 150 mg by mouth daily.    [provider]  capsaicin (ZOSTRIX) 0.025 % cream Apply 1 application topically 2 (two) times daily as needed (for pain).     [provider]    cholecalciferol (VITAMIN D) 1000 units tablet Take 1,000 Units by mouth daily.    [provider]  clopidogrel (PLAVIX) 75 MG tablet Take 1 tablet (75 mg total) by mouth daily with breakfast. 10/12/16   Reino Bellis B, NP  clotrimazole (LOTRIMIN) 1 % cream Apply 1 application topically 2 (two) times daily as needed (for rash).     [provider]  erythromycin ophthalmic ointment Place a 1/2 inch ribbon of ointment into the right lower eyelid 4 times per day for 7 days. 04/19/18   Long, Wonda Olds, MD  ferrous sulfate 325 (  65 FE) MG tablet Take 325 mg by mouth 3 (three) times a week. Tuesday, Wednesday and Friday    [provider]  finasteride (PROSCAR) 5 MG tablet Take 5 mg by mouth daily.    [provider]  gabapentin (NEURONTIN) 300 MG capsule Take 300 mg by mouth 2 (two) times daily.     [provider]  glucose 4 GM chewable tablet Chew 1 tablet by mouth as needed for low blood sugar.    [provider]  insulin aspart (NOVOLOG) 100 UNIT/ML injection Inject 25 Units into the skin 2 (two) times daily as needed for high blood sugar. 25 units before lunch and 25 units before dinner    [provider]  Insulin Detemir (LEVEMIR FLEXPEN) 100 UNIT/ML Pen Inject 50 Units into the skin 2 (two) times daily.     [provider]  isosorbide mononitrate (IMDUR) 60 MG 24 hr tablet Take 1 tablet (60 mg total) by mouth daily. Hold if blood pressure <100 07/30/17   Annita Brod, MD  ketoconazole (NIZORAL) 2 % cream Apply 1 application topically daily as needed. For rash     [provider]  levothyroxine (SYNTHROID, LEVOTHROID) 150 MCG tablet Take 150 mcg by mouth daily before breakfast.    [provider]  LIDOCAINE EX Apply 1 application topically 2 (two) times daily as needed (for pain).     [provider]  Magnesium Oxide 420 MG TABS Take 420 mg by mouth daily.     [provider]   MENTHOL-METHYL SALICYLATE EX Apply 1 application topically as needed (pain).    [provider]  mesalamine (CANASA) 1000 MG suppository Place 1,000 mg rectally at bedtime as needed (ulcerative colitis flares).     [provider]  mesalamine (LIALDA) 1.2 g EC tablet Take 1.2 g by mouth daily as needed (ulcerative colitis flares).    [provider]  methylPREDNISolone (MEDROL) 4 MG tablet Take 4 mg by mouth 2 (two) times daily as needed (gout flares).     [provider]  metoprolol succinate (TOPROL-XL) 25 MG 24 hr tablet Take 12.5 mg by mouth daily.    [provider]  mupirocin ointment (BACTROBAN) 2 % Place 1 application into the nose daily as needed (for mersa).     [provider]  neomycin-bacitracin-polymyxin (NEOSPORIN) ointment Apply 1 application topically as needed for wound care.    [provider]  nitroGLYCERIN (NITROSTAT) 0.4 MG SL tablet Place 0.4 mg under the tongue every 5 (five) minutes as needed for chest pain (x 3 doses daily).     [provider]  nystatin cream (MYCOSTATIN) Apply 1 application topically daily as needed for dry skin (rash).    [provider]  Omega-3 Fatty Acids (FISH OIL) 1000 MG CAPS Take 2,000 mg by mouth 2 (two) times daily.     [provider]  oxyCODONE-acetaminophen (PERCOCET) 10-325 MG per tablet Take 1 tablet by mouth every 6 (six) hours as needed for pain.     [provider]  RABEprazole (ACIPHEX) 20 MG tablet Take 40 mg by mouth 2 (two) times daily before a meal.     [provider]  rosuvastatin (CRESTOR) 40 MG tablet Take 40 mg by mouth at bedtime.     [provider]  silver sulfADIAZINE (SILVADENE) 1 % cream Apply 1 application topically daily as needed (skin care).     [provider]  terbinafine (LAMISIL) 1 % cream  Apply 1 application topically daily as needed (skin care).     [provider]  traZODone  (DESYREL) 50 MG tablet Take 1 tablet (50 mg total) by mouth at bedtime. 02/21/18   Sater, Nanine Means, MD  triamcinolone cream (KENALOG) 0.1 % Apply 1 application topically daily as needed (rash).    [provider]  urea 10 % lotion Apply 1 application topically as needed for dry skin.    [provider]    Family History Family History  Problem Relation Age of Onset  . Heart disease Father   . Alzheimer's disease Mother   . Pneumonia Other   . Colon cancer Neg Hx   . Esophageal cancer Neg Hx   . Stomach cancer Neg Hx   . Rectal cancer Neg Hx     Social History Social History   Tobacco Use  . Smoking status: Never Smoker  . Smokeless tobacco: Never Used  Substance Use Topics  . Alcohol use: No  . Drug use: Yes    Types: Cocaine    Comment: stopped cocaine 2 and 1/2 years ago     Allergies   Tetanus toxoids   Review of Systems Review of Systems  All other systems reviewed and are negative.    Physical Exam Updated Vital Signs BP 139/80 (BP Location: Left Arm)   Pulse 69   Temp 98.6 F (37 C) (Oral)   Resp 15   Wt 117.5 kg   SpO2 95%   BMI 35.13 kg/m   Physical Exam Vitals signs and nursing note reviewed.  Constitutional:      General: He is not in acute distress.    Appearance: He is well-developed. He is not ill-appearing, toxic-appearing or diaphoretic.  HENT:     Head: Normocephalic and atraumatic.     Right Ear: External ear normal.     Left Ear: External ear normal.  Eyes:     Conjunctiva/sclera: Conjunctivae normal.     Pupils: Pupils are equal, round, and reactive to light.  Neck:     Musculoskeletal: Normal range of motion and neck supple.     Trachea: Phonation normal.  Cardiovascular:     Rate and Rhythm: Normal rate and regular rhythm.     Heart sounds: Normal heart sounds.  Pulmonary:     Effort: Pulmonary effort is normal.     Breath sounds: Normal breath sounds.  Chest:     Chest wall: No tenderness.  Abdominal:       General: There is no distension.     Palpations: Abdomen is soft.     Tenderness: There is no abdominal tenderness.  Musculoskeletal: Normal range of motion.  Skin:    General: Skin is warm and dry.  Neurological:     Mental Status: He is alert and oriented to person, place, and time.     Cranial Nerves: No cranial nerve deficit.     Sensory: No sensory deficit.     Motor: No abnormal muscle tone.     Coordination: Coordination normal.  Psychiatric:        Behavior: Behavior normal.        Thought Content: Thought content normal.        Judgment: Judgment normal.     Comments: He appears depressed      ED Treatments / Results  Labs (all labs ordered are listed, but only abnormal results are displayed) Labs Reviewed  BASIC METABOLIC PANEL - Abnormal; Notable for the following components:  Result Value   Chloride 97 (*)    Glucose, Bld 303 (*)    Creatinine, Ser 1.58 (*)    GFR calc non Af Amer 42 (*)    GFR calc Af Amer 49 (*)    All other components within normal limits  CBC  I-STAT TROPONIN, ED    EKG EKG Interpretation  Date/Time:  Sunday July 27 2018 07:38:16 EST Ventricular Rate:  69 PR Interval:    QRS Duration: 142 QT Interval:  478 QTC Calculation: 513 R Axis:   -86 Text Interpretation:  Sinus rhythm Ventricular premature complex Right bundle branch block Baseline wander in lead(s) II III aVR aVL aVF V3 V4 Since last tracing PVC is new Confirmed by Daleen Bo [phone]) on 07/27/2018 7:49:01 AM   Radiology Dg Chest 2 View  Result Date: 07/27/2018 CLINICAL DATA:  Chest pain and shortness of breath EXAM: CHEST - 2 VIEW COMPARISON:  April 26, 2018 FINDINGS: There is slight bibasilar atelectasis. There is no edema or consolidation. Heart size and pulmonary vascularity are normal. Patient is status post coronary artery bypass grafting. No adenopathy. No bone lesions. IMPRESSION: Mild bibasilar atelectasis. No edema or consolidation. Stable  cardiac silhouette. Status post coronary artery bypass grafting. Electronically Signed   By: Lowella Grip III M.D.   On: 07/27/2018 08:04    Procedures Procedures (including critical care time)  Medications Ordered in ED Medications  sodium chloride flush (NS) 0.9 % injection 3 mL (3 mLs Intravenous Given 07/27/18 0758)  acetaminophen (TYLENOL) tablet 650 mg (650 mg Oral Given 07/27/18 0837)     Initial Impression / Assessment and Plan / ED Course  I have reviewed the triage vital signs and the nursing notes.  Pertinent labs & imaging results that were available during my care of the patient were reviewed by me and considered in my medical decision making (see chart for details).  Clinical Course as of Jul 27 936  Sun Jul 27, 2018  0919 Normal  I-stat troponin, ED [EW]  0919 Normal  CBC [EW]  0920 Normal except chloride low, glucose high, creatinine high, GFR low  Basic metabolic panel(!) [EW]  7425 Trending of laboratory trending of metabolic panel indicates mild elevation of glucose and decreased chloride from baseline.   [EW]  0921 No CHF or pneumonia, images reviewed by me  DG Chest 2 View [EW]    Clinical Course User Index [EW] Daleen Bo, MD     Patient Vitals for the past 24 hrs:  BP Temp Temp src Pulse Resp SpO2 Weight  07/27/18 0740 - - - - - - 117.5 kg  07/27/18 0739 139/80 98.6 F (37 C) Oral 69 15 95 % -    9:36 AM Reevaluation with update and discussion. After initial assessment and treatment, an updated evaluation reveals he is resting comfortably states he has been able to sleep, while here.  He now relates that his sleeplessness may have been related to his wife being "out of town."  Findings discussed with patient and wife, questions answered. Daleen Bo   Medical Decision Making: Nonspecific symptoms, with sleeplessness and chest discomfort.  Doubt ACS, PE or pneumonia.  Mild metabolic abnormalities with known diabetes.  Patient is essentially  at baseline.  No indication for hospitalization at this time.  Currently he is taking trazodone at night without improvement of his sleeplessness.  He is on low-dose trazodone, 50 mg, and can safely increase it to 100 mg, as needed sleeplessness.  CRITICAL CARE-no Performed  by: Daleen Bo   Nursing Notes Reviewed/ Care Coordinated Applicable Imaging Reviewed Interpretation of Laboratory Data incorporated into ED treatment  The patient appears reasonably screened and/or stabilized for discharge and I doubt any other medical condition or other [facility] requiring further screening, evaluation, or treatment in the ED at this time prior to discharge.  Plan: Home Medications-continue current medicines, increase trazodone as needed; Home Treatments-rest, fluids; return here if the recommended treatment, does not improve the symptoms; Recommended follow up-PCP, PRN     Final Clinical Impressions(s) / ED Diagnoses   Final diagnoses:  Insomnia, unspecified type  Nonspecific chest pain    ED Discharge Orders    None       Daleen Bo, MD 07/27/18 [phone]

## 2018-07-27 NOTE — ED Triage Notes (Signed)
Pt with worsening chest pain over the past 2 days. Pt's MD recently d/c amiodarone last week and scheduled for ablation. Pt states he is also experiencing chest heaviness and shortness of breath.

## 2018-07-27 NOTE — ED Notes (Signed)
Patient given discharge teaching and verbalized understanding. Patient was taken out of ED by ED staff with a wheelchair.

## 2018-08-19 DIAGNOSIS — H01009 Unspecified blepharitis unspecified eye, unspecified eyelid: Secondary | ICD-10-CM | POA: Diagnosis not present

## 2018-08-22 ENCOUNTER — Other Ambulatory Visit: Payer: Self-pay

## 2018-08-22 ENCOUNTER — Ambulatory Visit (INDEPENDENT_AMBULATORY_CARE_PROVIDER_SITE_OTHER): Payer: 59 | Admitting: Neurology

## 2018-08-22 ENCOUNTER — Encounter: Payer: Self-pay | Admitting: Neurology

## 2018-08-22 VITALS — BP 96/61 | HR 65 | Ht 72.0 in | Wt 264.5 lb

## 2018-08-22 DIAGNOSIS — G4733 Obstructive sleep apnea (adult) (pediatric): Secondary | ICD-10-CM | POA: Diagnosis not present

## 2018-08-22 DIAGNOSIS — G4700 Insomnia, unspecified: Secondary | ICD-10-CM | POA: Diagnosis not present

## 2018-08-22 DIAGNOSIS — E1142 Type 2 diabetes mellitus with diabetic polyneuropathy: Secondary | ICD-10-CM

## 2018-08-22 DIAGNOSIS — G44309 Post-traumatic headache, unspecified, not intractable: Secondary | ICD-10-CM | POA: Diagnosis not present

## 2018-08-22 MED ORDER — ALPRAZOLAM 0.25 MG PO TABS: 0.2500 mg | ORAL_TABLET | Freq: Every evening | ORAL | 5 refills | Status: DC | PRN

## 2018-08-22 NOTE — Progress Notes (Signed)
[facility]  PATIENT: Matthew [name] DOB: 26-Nov-1945  REFERRING DOCTOR OR PCP:  Lennette Bihari Via SOURCE: Patient, notes from Dr. Lynelle Doctor, emergency room notes, CT scan reports.  _________________________________   HISTORICAL  CHIEF COMPLAINT:  Chief Complaint  Patient presents with  . Follow-up    RM 12, alone. Last seen 02/21/2018. No new sx. Still having headaches--gets about 1 every two weeks. Wanting something for this. Does not take OTC meds as he's worried about what could interact with oxycodone    HISTORY OF PRESENT ILLNESS:  Matthew [name] Is a 73 year old man with postconcussive syndrome.  Update 08/22/2018: His Headaches are better with only 3-4 last month.    No nausea with the headaches.   No vision problems with headaches.     He still has trouble falling asleep at night but notes some benefit from trazodone.Marland Kitchen   He notes napping during the day.   He has OSA but does not use his CPAP (followed at [place]).        He has multiple cardiac issues including CAD with history of MI and A. fib.  He is on amiodarone and Eliquis.  He reports a lot of back pain and is on oxycodone and gabapentin.  He takes the gabapentin twice a day as he has mild chronic renal insufficiency.  He continues to report pain.  He notes mild numbness in his toes.  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  Update 02/21/2018: His headaches, nausea and vomiting have resolved.   The anxiety associated with his symptoms is doing better.     He reports difficulty with sleep onset more than sleep maintenance.   He slep better before his head injury last year.    He is on nortriptyline.   He has LBP and is on oxycodone and gabapentin.   He takes gabapentin 300 mg in am and 300 mg at night.     He has CAD, h/o MI and AFib.   He is on amiodarone and Eliquis.    He also has IDDM and very mild polyneuropathy.     Update 11/19/2017: He feels he is doing mildly better but still has  headaches and nausea with occasional vomiting (75 times a month).     Zofran has helped the nausea some.    He has pain from the right occiput to the forehead.   The TPI helped a lot with the intensity of the pain.    He feels his gait is slightly better but he has not been exercising much.   He has a cane but has not recently used it.     Sleep is much better since the alprazolam and it is more affordable than temazepam.        Update 08/15/2017:  He reports that he is continuing to experience headaches despite nortriptyline 25 mg. They are at the same frequency and severity. He will get nausea and projectile vomiting that is not always associated with headache.  These started after the falls.     The nortriptyline has not helped much.    Zofran has not helped much  He has had several falls, one 01/28/2017. The back of his head and another one 03/18/2017 in the left frontal region.  He did not have any hemorrhages with these.   He had a couple other falls too.    N/V has seemed worse since that fall.   Also he seems a little more confused.  He also is quick to get irritable.      The MRI of the brain 05/09/2018 showed generalized cortical atrophy and some chronic buccal vessel ischemic changes. There were no acute findings. He did have chronic microhemorrhages in the right basal ganglia and left cerebellum that are likely be sequela of hypertension  The insomnia is better with temazepam however it is expensive and he would like something else.   Update 04/10/2017:     The TPI last visit helped a lot for a few days   Since last visit, nortriptyline 10 mg was added. He feels the headaches are minimally better (10%). However, we also reduce the trazodone and he feels his sleep is doing worse.   His poor balance is better.     He sleeps more than he used to He feels unmotivated to get out of bed.      He was unable to complete the MRI due to nausea and claustrophobia. He had another fall while  urinating (he needs to strain due to a large prostate) on 03/18/2017 when he became weak and dizzy and he hit the left frontal region on the sink. The CT scan 03/18/1974 was personally reviewed. It did not show any acute intracranial abnormalities. 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      He is on chronic oxycodone 10/325 qid and has constipation  From 03/13/2017: On 01/28/2017, he fell on a slippery floor and landed on his back and the back of the head.   There was no LOC.   Later that night, he  Began to notice a headache and has had one since.    The headache is bi-occipital and radiates to the forehead.    Sometimes, the pain is all over.  Pain is a constant pressure sensation.    He wakes up with pain and goes to bed with a headache.    He takes oxycodone for his back (4/day) but it has not helped the headache much.   Nothing seems to increase or decrease the intensity much.    Vision is blurry at times.     He has had nausea and near-vomiting but nothing has come up.   He has been prescribed Zofran.     He is more indecisive and doesn't want to be alone since the incident and seems cognitively more slowed according to his wife.    He reports being more tired.         He first went to the [place] 01/29/2017 and had CT of the head (no bleed) and xrays of the cervical spine (no fractures).   He woke up one morning in intense pain and felt ;like he was dying'.   He was pacing and couldn't stay still.    His son took him to the ED 02/27/2017.   He went to [facility] ED 02/22/17 [name]) and [name] 02/27/2017.     Besides the headache, back pain has been worse since his fall.   He sees pain management and is on oxycodone.  He has diabetes mellitus.  He has been told he has neuropathy from the diabetes.Marland Kitchen He also has a history of an MI April 29 and had a stent placed. He has had arrhythmias and is on  amiodarone.  A CT scan report ([facility]) 02/22/2017 reports cortical and cerebellar atrophy, bilateral cerebellar lacunar infarctions and chronic microvessel ischemic  changes. There was no report of any bleed.  REVIEW OF SYSTEMS: Constitutional: No fevers, chills, sweats, or change in appetite Eyes: No visual changes, double vision, eye pain Ear, nose and throat: No hearing loss, ear pain, nasal congestion, sore throat Cardiovascular: Has history of MI and A. fib. Respiratory: No shortness of breath at rest or with exertion.   No wheezes.   He has OSA ans has CPAP GastrointestinaI: No nausea, vomiting, diarrhea, abdominal pain, fecal incontinence Genitourinary: No dysuria, urinary retention or frequency.  No nocturia. Musculoskeletal: He reports neck and back pain, see above Integumentary: No rash, pruritus, skin lesions Neurological: as above Psychiatric: No depression at this time.  No anxiety Endocrine: No palpitations, diaphoresis, change in appetite, change in weigh or increased thirst Hematologic/Lymphatic: No anemia, purpura, petechiae. Allergic/Immunologic: No itchy/runny eyes, nasal congestion, recent allergic reactions, rashes  ALLERGIES: Allergies  Allergen Reactions  . Tetanus Toxoids Hives    HOME MEDICATIONS:  Current Outpatient Medications:  .  oxyCODONE-acetaminophen (PERCOCET) 10-325 MG per tablet, Take 1 tablet by mouth every 6 (six) hours as needed for pain. , Disp: , Rfl:  .  ALPRAZolam (XANAX) 0.25 MG tablet, Take 1 tablet (0.25 mg total) by mouth at bedtime as needed for anxiety., Disp: 30 tablet, Rfl: 5 .  amiodarone (PACERONE) 200 MG tablet, Take 0.5 tablets (100 mg total) by mouth 2 (two) times daily., Disp: , Rfl:  .  apixaban (ELIQUIS) 5 MG TABS tablet, Take 5 mg by mouth 2 (two) times daily., Disp: , Rfl:  .  buPROPion (WELLBUTRIN XL) 150 MG 24 hr tablet, Take 150 mg by mouth daily., Disp: , Rfl:  .  capsaicin (ZOSTRIX) 0.025 % cream, Apply 1  application topically 2 (two) times daily as needed (for pain). , Disp: , Rfl:  .  cholecalciferol (VITAMIN D) 1000 units tablet, Take 1,000 Units by mouth daily., Disp: , Rfl:  .  clopidogrel (PLAVIX) 75 MG tablet, Take 1 tablet (75 mg total) by mouth daily with breakfast., Disp: 30 tablet, Rfl: 11 .  clotrimazole (LOTRIMIN) 1 % cream, Apply 1 application topically 2 (two) times daily as needed (for rash). , Disp: , Rfl:  .  erythromycin ophthalmic ointment, Place a 1/2 inch ribbon of ointment into the right lower eyelid 4 times per day for 7 days., Disp: 1 g, Rfl: 0 .  ferrous sulfate 325 (65 FE) MG tablet, Take 325 mg by mouth 3 (three) times a week. Tuesday, Wednesday and Friday, Disp: , Rfl:  .  finasteride (PROSCAR) 5 MG tablet, Take 5 mg by mouth daily., Disp: , Rfl:  .  gabapentin (NEURONTIN) 300 MG capsule, Take 300 mg by mouth 2 (two) times daily. , Disp: , Rfl:  .  glucose 4 GM chewable tablet, Chew 1 tablet by mouth as needed for low blood sugar., Disp: , Rfl:  .  insulin aspart (NOVOLOG) 100 UNIT/ML injection, Inject 25 Units into the skin 2 (two) times daily as needed for high blood sugar. 25 units before lunch and 25 units before dinner, Disp: , Rfl:  .  Insulin Detemir (LEVEMIR FLEXPEN) 100 UNIT/ML Pen, Inject 50 Units into the skin 2 (two) times daily. , Disp: , Rfl:  .  isosorbide mononitrate (IMDUR) 60 MG 24 hr tablet, Take 1 tablet (60 mg total) by mouth daily. Hold if blood pressure <100, Disp: 30 tablet, Rfl: 0 .  ketoconazole (NIZORAL) 2 % cream, Apply 1 application topically daily as needed. For rash , Disp: , Rfl:  .  levothyroxine (SYNTHROID, LEVOTHROID) 150 MCG tablet, Take 150 mcg by mouth daily before breakfast., Disp: , Rfl:  .  LIDOCAINE EX, Apply 1 application topically 2 (two) times daily as needed (for pain). , Disp: , Rfl:  .  Magnesium Oxide 420 MG TABS, Take 420 mg by mouth daily. , Disp: , Rfl:  .  MENTHOL-METHYL SALICYLATE EX, Apply 1 application topically as  needed (pain)., Disp: , Rfl:  .  mesalamine (CANASA) 1000 MG suppository, Place 1,000 mg rectally at bedtime as needed (ulcerative colitis flares). , Disp: , Rfl:  .  mesalamine (LIALDA) 1.2 g EC tablet, Take 1.2 g by mouth daily as needed (ulcerative colitis flares)., Disp: , Rfl:  .  methylPREDNISolone (MEDROL) 4 MG tablet, Take 4 mg by mouth 2 (two) times daily as needed (gout flares). , Disp: , Rfl:  .  metoprolol succinate (TOPROL-XL) 25 MG 24 hr tablet, Take 12.5 mg by mouth daily., Disp: , Rfl:  .  mupirocin ointment (BACTROBAN) 2 %, Place 1 application into the nose daily as needed (for mersa). , Disp: , Rfl:  .  neomycin-bacitracin-polymyxin (NEOSPORIN) ointment, Apply 1 application topically as needed for wound care., Disp: , Rfl:  .  nitroGLYCERIN (NITROSTAT) 0.4 MG SL tablet, Place 0.4 mg under the tongue every 5 (five) minutes as needed for chest pain (x 3 doses daily). , Disp: , Rfl:  .  nystatin cream (MYCOSTATIN), Apply 1 application topically daily as needed for dry skin (rash)., Disp: , Rfl:  .  Omega-3 Fatty Acids (FISH OIL) 1000 MG CAPS, Take 2,000 mg by mouth 2 (two) times daily. , Disp: , Rfl:  .  RABEprazole (ACIPHEX) 20 MG tablet, Take 40 mg by mouth 2 (two) times daily before a meal. , Disp: , Rfl:  .  rosuvastatin (CRESTOR) 40 MG tablet, Take 40 mg by mouth at bedtime. , Disp: , Rfl:  .  silver sulfADIAZINE (SILVADENE) 1 % cream, Apply 1 application topically daily as needed (skin care). , Disp: , Rfl:  .  terbinafine (LAMISIL) 1 % cream, Apply 1 application topically daily as needed (skin care). , Disp: , Rfl:  .  traZODone (DESYREL) 50 MG tablet, Take 1 tablet (50 mg total) by mouth at bedtime., Disp: 30 tablet, Rfl: 11 .  triamcinolone cream (KENALOG) 0.1 %, Apply 1 application topically daily as needed (rash)., Disp: , Rfl:  .  urea 10 % lotion, Apply 1 application topically as needed for dry skin., Disp: , Rfl:   PAST MEDICAL HISTORY: Past Medical History:   Diagnosis Date  . AAA (abdominal aortic aneurysm) ([place])    a. Aortic US 05/2012: 3.9 cm fusiform abdominal aortic aneurysm. F/u recommended 05/2014.  Marland Kitchen Anemia   . Arthritis    DDD  . Atrial fibrillation ([place])    a. Post-op CABG 05/2009. b. Re-identified 12/2013.   Marland Kitchen CAD (coronary artery disease)    a. s/p CABGx4 05/2009. 10/10/16 PCI with DES x1 SVG-->OM2/OM3  . Chronic constipation   . CKD (chronic kidney disease), stage III ([place])   . Cocaine abuse in remission ([place])   . Colon polyps   . Depression   . Diabetes mellitus   . Empyema lung ([place])    a. PNA 02/2011 c/b empyema requiring chest tube, drainage of pleural effusion and decortication.  . Enlarged prostate   . Frequent falls   . GERD (gastroesophageal reflux disease)   . Headache   . Hepatic steatosis   . Hyperlipidemia   . Hypertension   .  Hypothyroidism   . Iron deficiency anemia   . LAFB (left anterior fascicular block)   . Lung cancer ([place])    a. Tx with interleukin in 2002 per patient.  . Myocardial infarction ([place])    10/08/16  . Nodule of left lung    a. Cavitary nodules 02/2011, f/u recommended.  . Obesity   . Paroxysmal supraventricular tachycardia ([place])    a. While inpatient 02/2011 sick with empyema - wide appearace due to RBBB.  . Pneumonia   . Post concussion syndrome   . RBBB   . Renal cell carcinoma    a. s/p R nephrectomy 2002. Reported pulmonary mets per note at that time.  . Scrotal abscess    a. 2012 2/2 Streptococcus group C, status post incision and drainage.  . Sleep apnea    wears CPAP  . 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 Remote dx dating back to 1979.  . Vitamin D deficiency   . Wears dentures     PAST SURGICAL HISTORY: Past Surgical History:  Procedure Laterality Date  . Bronchoscopy, right video-assisted thoracoscopy, minithoracotomy, drainage of pleural effusion, and decortication.  02/26/2011   [facility]  . CABG x 4  05/13/2009   [facility]  . CARDIAC SURGERY    . COLONOSCOPY W/  BIOPSIES AND POLYPECTOMY    . CORONARY STENT INTERVENTION N/A 10/10/2016   Procedure: Coronary Stent Intervention;  Surgeon: Troy Sine, MD;  Location: [facility];  Service: Cardiovascular;  Laterality: N/A;  . HEMORRHOID SURGERY    . HERNIA REPAIR    . Incision, drainage and debridement of perineum and  scrotum    . Irrigation and debridement of scrotal wound and closure of scrotal wound.    Marland Kitchen LEFT HEART CATH AND CORS/GRAFTS ANGIOGRAPHY N/A 10/10/2016   Procedure: Left Heart Cath and Cors/Grafts Angiography;  Surgeon: Troy Sine, MD;  Location: [facility];  Service: Cardiovascular;  Laterality: N/A;  . MULTIPLE TOOTH EXTRACTIONS    . RADIOLOGY WITH ANESTHESIA N/A 05/09/2017   Procedure: MRI WITH ANESTHESIA   BRAIN WITHOUT CONTRAST;  Surgeon: Radiologist, Medication, MD;  Location: [facility];  Service: Radiology;  Laterality: N/A;  . Right radical nephrectomy.    . TOOTH EXTRACTION      FAMILY HISTORY: Family History  Problem Relation Age of Onset  . Heart disease Father   . Alzheimer's disease Mother   . Pneumonia Other   . Colon cancer Neg Hx   . Esophageal cancer Neg Hx   . Stomach cancer Neg Hx   . Rectal cancer Neg Hx     SOCIAL HISTORY:  Social History   Socioeconomic History  . Marital status: Married    Spouse name: Not on file  . Number of children: 4  . Years of education: Not on file  . Highest education level: Not on file  Occupational History  . Occupation: CAR SERVICE    Employer: [employer]  . Financial resource strain: Not on file  . Food insecurity:    Worry: Not on file    Inability: Not on file  . Transportation needs:    Medical: Not on file    Non-medical: Not on file  Tobacco Use  . Smoking status: Never Smoker  . Smokeless tobacco: Never Used  Substance and Sexual Activity  . Alcohol use: No  . Drug use: Yes    Types: Cocaine    Comment: stopped cocaine 2 and 1/2 years ago  .  Sexual activity: Not on file   Lifestyle  . Physical activity:    Days per week: Not on file    Minutes per session: Not on file  . Stress: Not on file  Relationships  . Social connections:    Talks on phone: Not on file    Gets together: Not on file    Attends religious service: Not on file    Active member of club or organization: Not on file    Attends meetings of clubs or organizations: Not on file    Relationship status: Not on file  . Intimate partner violence:    Fear of current or ex partner: Not on file    Emotionally abused: Not on file    Physically abused: Not on file    Forced sexual activity: Not on file  Other Topics Concern  . Not on file  Social History Narrative  . Not on file     PHYSICAL EXAM  Vitals:   08/22/18 0808  BP: 96/61  Pulse: 65  Weight: 264 lb 8 oz (120 kg)  Height: 6' (1.829 m)    Body mass index is 35.87 kg/m.   General: The patient is well-developed and well-nourished and in no acute distress.    He is tender over the splenius capitis muscles/occiput. Range of motion is fairly normal in the neck.   Neurologic Exam  Mental status: The patient is alert and oriented x 3 at the time of the examination. The patient has apparent normal recent and remote memory, with an apparently normal attention span and concentration ability.   Speech is normal.  Cranial nerves: Extraocular movements are full.  Facial strength and sensation was normal.  Trapezius strength was normal.  No obvious hearing deficits are noted.  Motor:  Muscle bulk is normal.  Muscle tone is normal.  Strength is 5/5 in the arms and proximal legs and 4+/5 in the toes  Sensory:    He had normal sensation in the arms.  There is normal sensation proximally in the legs but reduced vibration and touch sensation at the toes.  Sensation appeared to be normal at the ankles and above.  Coordination: Cerebellar testing shows good finger-nose-finger and okay heel-to-shin...  Gait and station: Station is normal.   Gait is arthritic and he has a wide tandem gait.  Romberg is negative..   Reflexes: Deep tendon reflexes are symmetric and normal bilaterally in arms, absent at the ankles.    DIAGNOSTIC DATA (LABS, IMAGING, TESTING) - I reviewed patient records, labs, notes, testing and imaging myself where available.  Lab Results  Component Value Date   WBC 8.1 07/27/2018   HGB 13.8 07/27/2018   HCT 43.9 07/27/2018   MCV 95.4 07/27/2018   PLT 194 07/27/2018      Component Value Date/Time   NA 135 07/27/2018 0759   K 3.7 07/27/2018 0759   CL 97 (L) 07/27/2018 0759   CO2 27 07/27/2018 0759   GLUCOSE 303 (H) 07/27/2018 0759   BUN 17 07/27/2018 0759   CREATININE 1.58 (H) 07/27/2018 0759   CALCIUM 9.3 07/27/2018 0759   PROT 6.8 07/28/2017 2345   ALBUMIN 3.2 (L) 07/28/2017 2345   AST 35 07/28/2017 2345   ALT 34 07/28/2017 2345   ALT 79 (H) 10/20/2014 1225   ALKPHOS 35 (L) 07/28/2017 2345   BILITOT 0.8 07/28/2017 2345   GFRNONAA 42 (L) 07/27/2018 0759   GFRAA 49 (L) 07/27/2018 0759   Lab Results  Component Value  Date   CHOL 194 10/09/2016   HDL 28 (L) 10/09/2016   LDLCALC 113 (H) 10/09/2016   TRIG 265 (H) 10/09/2016   CHOLHDL 6.9 10/09/2016   Lab Results  Component Value Date   HGBA1C 8.6 (H) 05/09/2017   No results found for: VITAMINB12 Lab Results  Component Value Date   TSH 2.090 10/08/2016       ASSESSMENT AND PLAN  Diabetic polyneuropathy associated with type 2 diabetes mellitus (HCC)  Post-concussion headache  Insomnia, unspecified type  OSA (obstructive sleep apnea)   1.     Continue low-dose alprazolam at bedtime and low-dose trazodone.  He takes 30-40 mg oxycodone daily and does not use CPAP for his OSA so we can increase the alprazolam any further.  Additionally, because amiodarone and trazodone can interact we can increase the trazodone dose further.   2.   He is not interested in going back on CPAP.   3.   Return to see me in  6 months or sooner if there  are new or worsening neurologic symptoms.   Aino Heckert A. Felecia Shelling, MD, [facility] 09/01/1989, 4:44 PM Certified in Neurology, Clinical Neurophysiology, Sleep Medicine, Pain Medicine and Neuroimaging  [facility] [address] [place], [place] [address] [phone]

## 2018-10-13 ENCOUNTER — Encounter (HOSPITAL_COMMUNITY): Payer: Self-pay | Admitting: Emergency Medicine

## 2018-10-13 ENCOUNTER — Emergency Department (HOSPITAL_COMMUNITY): Payer: No Typology Code available for payment source

## 2018-10-13 ENCOUNTER — Other Ambulatory Visit: Payer: Self-pay

## 2018-10-13 ENCOUNTER — Inpatient Hospital Stay (HOSPITAL_COMMUNITY)
Admission: EM | Admit: 2018-10-13 | Discharge: 2018-10-16 | DRG: 247 | Disposition: A | Discharge status: Home / Self Care | Payer: No Typology Code available for payment source | Attending: Internal Medicine | Admitting: Internal Medicine

## 2018-10-13 DIAGNOSIS — I10 Essential (primary) hypertension: Secondary | ICD-10-CM

## 2018-10-13 DIAGNOSIS — I714 Abdominal aortic aneurysm, without rupture, unspecified: Secondary | ICD-10-CM | POA: Diagnosis present

## 2018-10-13 DIAGNOSIS — E1169 Type 2 diabetes mellitus with other specified complication: Secondary | ICD-10-CM

## 2018-10-13 DIAGNOSIS — I2510 Atherosclerotic heart disease of native coronary artery without angina pectoris: Secondary | ICD-10-CM | POA: Diagnosis present

## 2018-10-13 DIAGNOSIS — K760 Fatty (change of) liver, not elsewhere classified: Secondary | ICD-10-CM | POA: Diagnosis present

## 2018-10-13 DIAGNOSIS — Z79890 Hormone replacement therapy (postmenopausal): Secondary | ICD-10-CM

## 2018-10-13 DIAGNOSIS — I25119 Atherosclerotic heart disease of native coronary artery with unspecified angina pectoris: Secondary | ICD-10-CM

## 2018-10-13 DIAGNOSIS — I129 Hypertensive chronic kidney disease with stage 1 through stage 4 chronic kidney disease, or unspecified chronic kidney disease: Secondary | ICD-10-CM | POA: Diagnosis present

## 2018-10-13 DIAGNOSIS — Z7902 Long term (current) use of antithrombotics/antiplatelets: Secondary | ICD-10-CM

## 2018-10-13 DIAGNOSIS — Z1159 Encounter for screening for other viral diseases: Secondary | ICD-10-CM

## 2018-10-13 DIAGNOSIS — Z7901 Long term (current) use of anticoagulants: Secondary | ICD-10-CM

## 2018-10-13 DIAGNOSIS — N400 Enlarged prostate without lower urinary tract symptoms: Secondary | ICD-10-CM | POA: Diagnosis present

## 2018-10-13 DIAGNOSIS — C649 Malignant neoplasm of unspecified kidney, except renal pelvis: Secondary | ICD-10-CM | POA: Diagnosis present

## 2018-10-13 DIAGNOSIS — E785 Hyperlipidemia, unspecified: Secondary | ICD-10-CM | POA: Diagnosis present

## 2018-10-13 DIAGNOSIS — R0789 Other chest pain: Secondary | ICD-10-CM | POA: Diagnosis not present

## 2018-10-13 DIAGNOSIS — E1165 Type 2 diabetes mellitus with hyperglycemia: Secondary | ICD-10-CM | POA: Diagnosis present

## 2018-10-13 DIAGNOSIS — R079 Chest pain, unspecified: Secondary | ICD-10-CM | POA: Diagnosis not present

## 2018-10-13 DIAGNOSIS — K5120 Ulcerative (chronic) proctitis without complications: Secondary | ICD-10-CM | POA: Diagnosis present

## 2018-10-13 DIAGNOSIS — D696 Thrombocytopenia, unspecified: Secondary | ICD-10-CM | POA: Diagnosis present

## 2018-10-13 DIAGNOSIS — Z85528 Personal history of other malignant neoplasm of kidney: Secondary | ICD-10-CM

## 2018-10-13 DIAGNOSIS — I2583 Coronary atherosclerosis due to lipid rich plaque: Secondary | ICD-10-CM | POA: Diagnosis not present

## 2018-10-13 DIAGNOSIS — K219 Gastro-esophageal reflux disease without esophagitis: Secondary | ICD-10-CM | POA: Diagnosis present

## 2018-10-13 DIAGNOSIS — Z7952 Long term (current) use of systemic steroids: Secondary | ICD-10-CM

## 2018-10-13 DIAGNOSIS — I252 Old myocardial infarction: Secondary | ICD-10-CM

## 2018-10-13 DIAGNOSIS — N183 Chronic kidney disease, stage 3 unspecified: Secondary | ICD-10-CM | POA: Diagnosis present

## 2018-10-13 DIAGNOSIS — Z8249 Family history of ischemic heart disease and other diseases of the circulatory system: Secondary | ICD-10-CM

## 2018-10-13 DIAGNOSIS — J449 Chronic obstructive pulmonary disease, unspecified: Secondary | ICD-10-CM | POA: Diagnosis present

## 2018-10-13 DIAGNOSIS — Z955 Presence of coronary angioplasty implant and graft: Secondary | ICD-10-CM

## 2018-10-13 DIAGNOSIS — R778 Other specified abnormalities of plasma proteins: Secondary | ICD-10-CM

## 2018-10-13 DIAGNOSIS — I209 Angina pectoris, unspecified: Secondary | ICD-10-CM | POA: Diagnosis present

## 2018-10-13 DIAGNOSIS — Z79899 Other long term (current) drug therapy: Secondary | ICD-10-CM

## 2018-10-13 DIAGNOSIS — R072 Precordial pain: Secondary | ICD-10-CM

## 2018-10-13 DIAGNOSIS — N179 Acute kidney failure, unspecified: Secondary | ICD-10-CM | POA: Diagnosis present

## 2018-10-13 DIAGNOSIS — Z7982 Long term (current) use of aspirin: Secondary | ICD-10-CM

## 2018-10-13 DIAGNOSIS — Z951 Presence of aortocoronary bypass graft: Secondary | ICD-10-CM

## 2018-10-13 DIAGNOSIS — E039 Hypothyroidism, unspecified: Secondary | ICD-10-CM | POA: Diagnosis present

## 2018-10-13 DIAGNOSIS — I480 Paroxysmal atrial fibrillation: Secondary | ICD-10-CM | POA: Diagnosis present

## 2018-10-13 DIAGNOSIS — Z905 Acquired absence of kidney: Secondary | ICD-10-CM

## 2018-10-13 DIAGNOSIS — F1411 Cocaine abuse, in remission: Secondary | ICD-10-CM | POA: Diagnosis present

## 2018-10-13 DIAGNOSIS — Z794 Long term (current) use of insulin: Secondary | ICD-10-CM

## 2018-10-13 DIAGNOSIS — I214 Non-ST elevation (NSTEMI) myocardial infarction: Principal | ICD-10-CM | POA: Diagnosis present

## 2018-10-13 DIAGNOSIS — E1122 Type 2 diabetes mellitus with diabetic chronic kidney disease: Secondary | ICD-10-CM | POA: Diagnosis present

## 2018-10-13 DIAGNOSIS — R7989 Other specified abnormal findings of blood chemistry: Secondary | ICD-10-CM

## 2018-10-13 DIAGNOSIS — Z85118 Personal history of other malignant neoplasm of bronchus and lung: Secondary | ICD-10-CM

## 2018-10-13 LAB — SARS CORONAVIRUS 2 BY RT PCR (HOSPITAL ORDER, PERFORMED IN ~~LOC~~ HOSPITAL LAB): SARS Coronavirus 2: NEGATIVE

## 2018-10-13 LAB — CBC WITH DIFFERENTIAL/PLATELET
Abs Immature Granulocytes: 0.02 10*3/uL (ref 0.00–0.07)
Basophils Absolute: 0 10*3/uL (ref 0.0–0.1)
Basophils Relative: 1 %
Eosinophils Absolute: 0.2 10*3/uL (ref 0.0–0.5)
Eosinophils Relative: 3 %
HCT: 43 % (ref 39.0–52.0)
Hemoglobin: 13.7 g/dL (ref 13.0–17.0)
Immature Granulocytes: 0 %
Lymphocytes Relative: 33 %
Lymphs Abs: 2.4 10*3/uL (ref 0.7–4.0)
MCH: 30.7 pg (ref 26.0–34.0)
MCHC: 31.9 g/dL (ref 30.0–36.0)
MCV: 96.4 fL (ref 80.0–100.0)
Monocytes Absolute: 0.4 10*3/uL (ref 0.1–1.0)
Monocytes Relative: 6 %
Neutro Abs: 4.3 10*3/uL (ref 1.7–7.7)
Neutrophils Relative %: 57 %
Platelets: 82 10*3/uL — ABNORMAL LOW (ref 150–400)
RBC: 4.46 MIL/uL (ref 4.22–5.81)
RDW: 13.4 % (ref 11.5–15.5)
WBC: 7.3 10*3/uL (ref 4.0–10.5)
nRBC: 0 % (ref 0.0–0.2)

## 2018-10-13 LAB — TROPONIN I: Troponin I: 0.06 ng/mL (ref <0.03)

## 2018-10-13 LAB — COMPREHENSIVE METABOLIC PANEL
ALT: 19 U/L (ref 0–44)
AST: 26 U/L (ref 15–41)
Albumin: 3.1 g/dL — ABNORMAL LOW (ref 3.5–5.0)
Alkaline Phosphatase: 29 U/L — ABNORMAL LOW (ref 38–126)
Anion gap: 11 (ref 5–15)
BUN: 16 mg/dL (ref 8–23)
CO2: 22 mmol/L (ref 22–32)
Calcium: 8.9 mg/dL (ref 8.9–10.3)
Chloride: 104 mmol/L (ref 98–111)
Creatinine, Ser: 1.89 mg/dL — ABNORMAL HIGH (ref 0.61–1.24)
GFR calc Af Amer: 40 mL/min — ABNORMAL LOW (ref >60)
GFR calc non Af Amer: 34 mL/min — ABNORMAL LOW (ref >60)
Glucose, Bld: 257 mg/dL — ABNORMAL HIGH (ref 70–99)
Potassium: 4.7 mmol/L (ref 3.5–5.1)
Sodium: 137 mmol/L (ref 135–145)
Total Bilirubin: 1.1 mg/dL (ref 0.3–1.2)
Total Protein: 6.2 g/dL — ABNORMAL LOW (ref 6.5–8.1)

## 2018-10-13 LAB — GLUCOSE, CAPILLARY: Glucose-Capillary: 164 mg/dL — ABNORMAL HIGH (ref 70–99)

## 2018-10-13 LAB — LIPASE, BLOOD: Lipase: 17 U/L (ref 11–51)

## 2018-10-13 MED ORDER — BUPROPION HCL ER (XL) 150 MG PO TB24
150.0000 mg | ORAL_TABLET | Freq: Every day | ORAL | Status: DC
  Administered 2018-10-14 – 2018-10-16 (×3): 150 mg via ORAL
  Filled 2018-10-13 (×3): qty 1

## 2018-10-13 MED ORDER — OXYCODONE-ACETAMINOPHEN 10-325 MG PO TABS: 1.0000 | ORAL_TABLET | Freq: Four times a day (QID) | ORAL | Status: DC | PRN

## 2018-10-13 MED ORDER — EZETIMIBE 10 MG PO TABS
5.0000 mg | ORAL_TABLET | Freq: Every day | ORAL | Status: DC
  Administered 2018-10-13 – 2018-10-15 (×3): 5 mg via ORAL
  Filled 2018-10-13 (×3): qty 1

## 2018-10-13 MED ORDER — ASPIRIN 81 MG PO CHEW
81.0000 mg | CHEWABLE_TABLET | Freq: Every day | ORAL | Status: DC
  Administered 2018-10-14 – 2018-10-16 (×3): 81 mg via ORAL
  Filled 2018-10-13 (×3): qty 1

## 2018-10-13 MED ORDER — LINACLOTIDE 145 MCG PO CAPS
290.0000 ug | ORAL_CAPSULE | Freq: Every day | ORAL | Status: DC | PRN
  Administered 2018-10-16: 09:00:00 290 ug via ORAL
  Filled 2018-10-13 (×2): qty 2

## 2018-10-13 MED ORDER — LIDOCAINE VISCOUS HCL 2 % MT SOLN
15.0000 mL | Freq: Once | OROMUCOSAL | Status: AC
  Administered 2018-10-13: 14:00:00 15 mL via ORAL
  Filled 2018-10-13: qty 15

## 2018-10-13 MED ORDER — ACETAMINOPHEN 325 MG PO TABS: 650.0000 mg | ORAL_TABLET | Freq: Four times a day (QID) | ORAL | Status: DC | PRN

## 2018-10-13 MED ORDER — APIXABAN 5 MG PO TABS
5.0000 mg | ORAL_TABLET | Freq: Two times a day (BID) | ORAL | Status: DC
  Administered 2018-10-13: 21:00:00 5 mg via ORAL
  Filled 2018-10-13: qty 1

## 2018-10-13 MED ORDER — ROSUVASTATIN CALCIUM 20 MG PO TABS
20.0000 mg | ORAL_TABLET | Freq: Every day | ORAL | Status: DC
  Administered 2018-10-13 – 2018-10-15 (×3): 20 mg via ORAL
  Filled 2018-10-13 (×3): qty 1

## 2018-10-13 MED ORDER — ALPRAZOLAM 0.25 MG PO TABS
0.2500 mg | ORAL_TABLET | Freq: Every evening | ORAL | Status: DC | PRN
  Administered 2018-10-15 – 2018-10-16 (×2): 0.25 mg via ORAL
  Filled 2018-10-13 (×2): qty 1

## 2018-10-13 MED ORDER — FINASTERIDE 5 MG PO TABS
5.0000 mg | ORAL_TABLET | Freq: Every day | ORAL | Status: DC
  Administered 2018-10-13 – 2018-10-15 (×3): 5 mg via ORAL
  Filled 2018-10-13 (×3): qty 1

## 2018-10-13 MED ORDER — MORPHINE SULFATE (PF) 2 MG/ML IV SOLN: 2.0000 mg | INTRAVENOUS | Status: DC | PRN

## 2018-10-13 MED ORDER — SENNA 8.6 MG PO TABS: 1.0000 | ORAL_TABLET | ORAL | Status: DC

## 2018-10-13 MED ORDER — ONDANSETRON HCL 4 MG/2ML IJ SOLN: 4.0000 mg | Freq: Four times a day (QID) | INTRAMUSCULAR | Status: DC | PRN

## 2018-10-13 MED ORDER — PANTOPRAZOLE SODIUM 40 MG PO TBEC: 40.0000 mg | DELAYED_RELEASE_TABLET | Freq: Two times a day (BID) | ORAL | Status: DC

## 2018-10-13 MED ORDER — NORTRIPTYLINE HCL 25 MG PO CAPS: 25.0000 mg | ORAL_CAPSULE | Freq: Every evening | ORAL | Status: DC | PRN

## 2018-10-13 MED ORDER — SENNA 8.6 MG PO TABS
1.0000 | ORAL_TABLET | Freq: Every day | ORAL | Status: DC
  Administered 2018-10-13 – 2018-10-15 (×3): 8.6 mg via ORAL
  Filled 2018-10-13 (×3): qty 1

## 2018-10-13 MED ORDER — MESALAMINE 1.2 G PO TBEC
1.2000 g | DELAYED_RELEASE_TABLET | Freq: Every day | ORAL | Status: DC | PRN
  Filled 2018-10-13: qty 1

## 2018-10-13 MED ORDER — GABAPENTIN 300 MG PO CAPS
300.0000 mg | ORAL_CAPSULE | Freq: Two times a day (BID) | ORAL | Status: DC
  Administered 2018-10-13 – 2018-10-16 (×6): 300 mg via ORAL
  Filled 2018-10-13 (×6): qty 1

## 2018-10-13 MED ORDER — INSULIN DETEMIR 100 UNIT/ML ~~LOC~~ SOLN
25.0000 [IU] | Freq: Two times a day (BID) | SUBCUTANEOUS | Status: DC
  Administered 2018-10-13: 23:00:00 12.5 [IU] via SUBCUTANEOUS
  Administered 2018-10-14 – 2018-10-16 (×5): 25 [IU] via SUBCUTANEOUS
  Filled 2018-10-13 (×8): qty 0.25

## 2018-10-13 MED ORDER — FERROUS SULFATE 325 (65 FE) MG PO TABS
325.0000 mg | ORAL_TABLET | ORAL | Status: DC
  Administered 2018-10-14 – 2018-10-15 (×3): 325 mg via ORAL
  Filled 2018-10-13 (×3): qty 1

## 2018-10-13 MED ORDER — FENOFIBRATE 54 MG PO TABS
54.0000 mg | ORAL_TABLET | Freq: Every day | ORAL | Status: DC
  Administered 2018-10-13 – 2018-10-15 (×3): 54 mg via ORAL
  Filled 2018-10-13 (×3): qty 1

## 2018-10-13 MED ORDER — ALUM & MAG HYDROXIDE-SIMETH 200-200-20 MG/5ML PO SUSP
30.0000 mL | Freq: Once | ORAL | Status: AC
  Administered 2018-10-13: 14:00:00 30 mL via ORAL
  Filled 2018-10-13: qty 30

## 2018-10-13 MED ORDER — NITROGLYCERIN 0.4 MG SL SUBL: 0.4000 mg | SUBLINGUAL_TABLET | SUBLINGUAL | Status: DC | PRN

## 2018-10-13 MED ORDER — INSULIN ASPART 100 UNIT/ML ~~LOC~~ SOLN
0.0000 [IU] | Freq: Three times a day (TID) | SUBCUTANEOUS | Status: DC
  Administered 2018-10-14: 10:00:00 2 [IU] via SUBCUTANEOUS
  Administered 2018-10-14: 14:00:00 3 [IU] via SUBCUTANEOUS
  Administered 2018-10-14 – 2018-10-15 (×2): 5 [IU] via SUBCUTANEOUS
  Administered 2018-10-15: 13:00:00 2 [IU] via SUBCUTANEOUS
  Administered 2018-10-16: 09:00:00 3 [IU] via SUBCUTANEOUS

## 2018-10-13 MED ORDER — ISOSORBIDE MONONITRATE ER 60 MG PO TB24
60.0000 mg | ORAL_TABLET | Freq: Every day | ORAL | Status: DC
  Administered 2018-10-14 – 2018-10-16 (×2): 60 mg via ORAL
  Filled 2018-10-13 (×3): qty 1

## 2018-10-13 MED ORDER — MAGNESIUM OXIDE 400 (241.3 MG) MG PO TABS
420.0000 mg | ORAL_TABLET | Freq: Two times a day (BID) | ORAL | Status: DC
  Administered 2018-10-13 – 2018-10-16 (×6): 400 mg via ORAL
  Filled 2018-10-13 (×6): qty 1

## 2018-10-13 MED ORDER — TRAZODONE HCL 50 MG PO TABS
50.0000 mg | ORAL_TABLET | Freq: Every day | ORAL | Status: DC
  Administered 2018-10-13 – 2018-10-15 (×3): 50 mg via ORAL
  Filled 2018-10-13 (×3): qty 1

## 2018-10-13 MED ORDER — SENNA 8.6 MG PO TABS
2.0000 | ORAL_TABLET | Freq: Every morning | ORAL | Status: DC
  Administered 2018-10-14 – 2018-10-15 (×2): 17.2 mg via ORAL
  Filled 2018-10-13 (×2): qty 2

## 2018-10-13 MED ORDER — ONDANSETRON HCL 4 MG PO TABS: 4.0000 mg | ORAL_TABLET | Freq: Four times a day (QID) | ORAL | Status: DC | PRN

## 2018-10-13 MED ORDER — SUCRALFATE 1 GM/10ML PO SUSP
1.0000 g | Freq: Three times a day (TID) | ORAL | Status: DC
  Administered 2018-10-13 – 2018-10-16 (×9): 1 g via ORAL
  Filled 2018-10-13 (×9): qty 10

## 2018-10-13 MED ORDER — IOHEXOL 350 MG/ML SOLN
80.0000 mL | Freq: Once | INTRAVENOUS | Status: AC | PRN
  Administered 2018-10-13: 15:00:00 80 mL via INTRAVENOUS

## 2018-10-13 MED ORDER — METOPROLOL SUCCINATE ER 25 MG PO TB24
12.5000 mg | ORAL_TABLET | Freq: Every day | ORAL | Status: DC
  Administered 2018-10-14 – 2018-10-16 (×2): 12.5 mg via ORAL
  Filled 2018-10-13 (×3): qty 1

## 2018-10-13 MED ORDER — HYDROXYZINE HCL 10 MG PO TABS: 10.0000 mg | ORAL_TABLET | Freq: Every day | ORAL | Status: DC | PRN

## 2018-10-13 MED ORDER — ACETAMINOPHEN 650 MG RE SUPP: 650.0000 mg | Freq: Four times a day (QID) | RECTAL | Status: DC | PRN

## 2018-10-13 MED ORDER — OXYCODONE HCL 5 MG PO TABS
10.0000 mg | ORAL_TABLET | Freq: Four times a day (QID) | ORAL | Status: DC | PRN
  Administered 2018-10-14 – 2018-10-15 (×3): 10 mg via ORAL
  Filled 2018-10-13 (×3): qty 2

## 2018-10-13 MED ORDER — DRONEDARONE HCL 400 MG PO TABS
400.0000 mg | ORAL_TABLET | Freq: Two times a day (BID) | ORAL | Status: DC
  Administered 2018-10-13 – 2018-10-16 (×5): 400 mg via ORAL
  Filled 2018-10-13 (×7): qty 1

## 2018-10-13 MED ORDER — LEVOTHYROXINE SODIUM 75 MCG PO TABS
175.0000 ug | ORAL_TABLET | Freq: Every day | ORAL | Status: DC
  Administered 2018-10-14 – 2018-10-16 (×3): 175 ug via ORAL
  Filled 2018-10-13 (×4): qty 1

## 2018-10-13 MED ORDER — MESALAMINE 1000 MG RE SUPP
1000.0000 mg | Freq: Every evening | RECTAL | Status: DC | PRN
  Filled 2018-10-13: qty 1

## 2018-10-13 MED ORDER — ACETAMINOPHEN 325 MG PO TABS: 325.0000 mg | ORAL_TABLET | Freq: Four times a day (QID) | ORAL | Status: DC | PRN

## 2018-10-13 NOTE — ED Triage Notes (Signed)
To ED [facility] from home with c/o che44st pain after eating- started Saturday, took NTG with relief- pain worse today- given 1 NTG without relief- BP dropped.  Pt has extensive cardiac history.

## 2018-10-13 NOTE — ED Notes (Addendum)
ED TO INPATIENT HANDOFF REPORT  ED Nurse Name and Phone #: Eugene Garnet  [phone]  S Name/Age/Gender Matthew [name] 73 y.o. male Room/Bed: 046C/046C  Code Status   Code Status: Prior  Home/SNF/Other Home Patient oriented to: self, place, time and situation Is this baseline? Yes   Triage Complete: Triage complete  Chief Complaint Chest Pain  Triage Note To ED vai [facility] from home with c/o che44st pain after eating- started Saturday, took NTG with relief- pain worse today- given 1 NTG without relief- BP dropped.  Pt has extensive cardiac history.    Allergies Allergies  Allergen Reactions  . Amiodarone Other (See Comments)    Caused lung and liver toxicity  . Tetanus Toxoids Hives    Level of Care/Admitting Diagnosis ED Disposition    ED Disposition Condition Comment   Admit  Hospital Area: [facility] [[id number]]  Level of Care: Telemetry Cardiac [103]  I expect the patient will be discharged within 24 hours: Yes  LOW acuity---Tx typically complete <24 hrs---ACUTE conditions typically can be evaluated <24 hours---LABS likely to return to acceptable levels <24 hours---IS near functional baseline---EXPECTED to return to current living arrangement---NOT newly hypoxic: Meets criteria for 5C-Observation unit  Covid Evaluation: N/A  Diagnosis: Chest pain [124580]  Admitting Physician: Tawni Millers [[id number]]  Attending Physician: Tawni Millers [[id number]]  PT Class (Do Not Modify): Observation [104]  PT Acc Code (Do Not Modify): Observation [10022]       B Medical/Surgery History Past Medical History:  Diagnosis Date  . AAA (abdominal aortic aneurysm) ([place])    a. Aortic US 05/2012: 3.9 cm fusiform abdominal aortic aneurysm. F/u recommended 05/2014.  Marland Kitchen Anemia   . Arthritis    DDD  . Atrial fibrillation ([place])    a. Post-op CABG 05/2009. b. Re-identified 12/2013.   Marland Kitchen CAD (coronary artery disease)    a. s/p CABGx4 05/2009. 10/10/16 PCI  with DES x1 SVG-->OM2/OM3  . Chronic constipation   . CKD (chronic kidney disease), stage III ([place])   . Cocaine abuse in remission ([place])   . Colon polyps   . Depression   . Diabetes mellitus   . Empyema lung ([place])    a. PNA 02/2011 c/b empyema requiring chest tube, drainage of pleural effusion and decortication.  . Enlarged prostate   . Frequent falls   . GERD (gastroesophageal reflux disease)   . Headache   . Hepatic steatosis   . Hyperlipidemia   . Hypertension   . Hypothyroidism   . Iron deficiency anemia   . LAFB (left anterior fascicular block)   . Lung cancer ([place])    a. Tx with interleukin in 2002 per patient.  . Myocardial infarction ([place])    10/08/16  . Nodule of left lung    a. Cavitary nodules 02/2011, f/u recommended.  . Obesity   . Paroxysmal supraventricular tachycardia ([place])    a. While inpatient 02/2011 sick with empyema - wide appearace due to RBBB.  . Pneumonia   . Post concussion syndrome   . RBBB   . Renal cell carcinoma    a. s/p R nephrectomy 2002. Reported pulmonary mets per note at that time.  . Scrotal abscess    a. 2012 2/2 Streptococcus group C, status post incision and drainage.  . Sleep apnea    wears CPAP  . 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  . Vitamin D deficiency   . Wears dentures  Past Surgical History:  Procedure Laterality Date  . Bronchoscopy, right video-assisted thoracoscopy, minithoracotomy, drainage of pleural effusion, and decortication.  02/26/2011   [facility]  . CABG x 4  05/13/2009   [facility]  . CARDIAC SURGERY    . COLONOSCOPY W/ BIOPSIES AND POLYPECTOMY    . CORONARY STENT INTERVENTION N/A 10/10/2016   Procedure: Coronary Stent Intervention;  Surgeon: Troy Sine, MD;  Location: [facility];  Service: Cardiovascular;  Laterality: N/A;  . HEMORRHOID SURGERY    . HERNIA REPAIR    . Incision, drainage and debridement of perineum and  scrotum    . Irrigation and debridement of  scrotal wound and closure of scrotal wound.    Marland Kitchen LEFT HEART CATH AND CORS/GRAFTS ANGIOGRAPHY N/A 10/10/2016   Procedure: Left Heart Cath and Cors/Grafts Angiography;  Surgeon: Troy Sine, MD;  Location: [facility];  Service: Cardiovascular;  Laterality: N/A;  . MULTIPLE TOOTH EXTRACTIONS    . RADIOLOGY WITH ANESTHESIA N/A 05/09/2017   Procedure: MRI WITH ANESTHESIA   BRAIN WITHOUT CONTRAST;  Surgeon: Radiologist, Medication, MD;  Location: [facility];  Service: Radiology;  Laterality: N/A;  . Right radical nephrectomy.    . TOOTH EXTRACTION       A IV Location/Drains/Wounds Patient Lines/Drains/Airways Status   Active Line/Drains/Airways    Name:   Placement date:   Placement time:   Site:   Days:   Peripheral IV 10/13/18 Right Forearm   10/13/18    1253    Forearm   less than 1          Intake/Output Last 24 hours No intake or output data in the 24 hours ending 10/13/18 1814  Labs/Imaging Results for orders placed or performed during the hospital encounter of 10/13/18 (from the past 48 hour(s))  CBC with Differential     Status: Abnormal   Collection Time: 10/13/18  1:43 PM  Result Value Ref Range   WBC 7.3 4.0 - 10.5 K/uL   RBC 4.46 4.22 - 5.81 MIL/uL   Hemoglobin 13.7 13.0 - 17.0 g/dL   HCT 43.0 39.0 - 52.0 %   MCV 96.4 80.0 - 100.0 fL   MCH 30.7 26.0 - 34.0 pg   MCHC 31.9 30.0 - 36.0 g/dL   RDW 13.4 11.5 - 15.5 %   Platelets 82 (L) 150 - 400 K/uL    Comment: REPEATED TO VERIFY PLATELET COUNT CONFIRMED BY SMEAR Immature Platelet Fraction may be clinically indicated, consider ordering this additional test [id number]    nRBC 0.0 0.0 - 0.2 %   Neutrophils Relative % 57 %   Neutro Abs 4.3 1.7 - 7.7 K/uL   Lymphocytes Relative 33 %   Lymphs Abs 2.4 0.7 - 4.0 K/uL   Monocytes Relative 6 %   Monocytes Absolute 0.4 0.1 - 1.0 K/uL   Eosinophils Relative 3 %   Eosinophils Absolute 0.2 0.0 - 0.5 K/uL   Basophils Relative 1 %   Basophils Absolute 0.0 0.0 - 0.1 K/uL    Immature Granulocytes 0 %   Abs Immature Granulocytes 0.02 0.00 - 0.07 K/uL    Comment: Performed at [facility], [address] [place], [place] [address]  Comprehensive metabolic panel     Status: Abnormal   Collection Time: 10/13/18  1:43 PM  Result Value Ref Range   Sodium 137 135 - 145 mmol/L   Potassium 4.7 3.5 - 5.1 mmol/L   Chloride 104 98 - 111 mmol/L  CO2 22 22 - 32 mmol/L   Glucose, Bld 257 (H) 70 - 99 mg/dL   BUN 16 8 - 23 mg/dL   Creatinine, Ser 1.89 (H) 0.61 - 1.24 mg/dL   Calcium 8.9 8.9 - 10.3 mg/dL   Total Protein 6.2 (L) 6.5 - 8.1 g/dL   Albumin 3.1 (L) 3.5 - 5.0 g/dL   AST 26 15 - 41 U/L   ALT 19 0 - 44 U/L   Alkaline Phosphatase 29 (L) 38 - 126 U/L   Total Bilirubin 1.1 0.3 - 1.2 mg/dL   GFR calc non Af Amer 34 (L) >60 mL/min   GFR calc Af Amer 40 (L) >60 mL/min   Anion gap 11 5 - 15    Comment: Performed at [facility] [address] [place], [place] [address]  Troponin I - ONCE - STAT     Status: Abnormal   Collection Time: 10/13/18  1:43 PM  Result Value Ref Range   Troponin I 0.06 (HH) <0.03 ng/mL    Comment: CRITICAL RESULT CALLED TO, READ BACK BY AND VERIFIED WITH: H HARDY,RN 1523 10/13/2018 WBOND Performed at [facility], [place] [address], [place], [place] [address]   Lipase, blood     Status: None   Collection Time: 10/13/18  1:43 PM  Result Value Ref Range   Lipase 17 11 - 51 U/L    Comment: Performed at [facility] [address] [place], [place] [address]   Dg Chest 2 View  Result Date: 10/13/2018 CLINICAL DATA:  Left-sided chest pain for the past 2 days. EXAM: CHEST - 2 VIEW COMPARISON:  Chest x-ray dated July 27, 2018. FINDINGS: Stable cardiomediastinal silhouette status post CABG. Normal pulmonary vascularity. Progressive linear scarring/atelectasis in the right lower lobe. No focal consolidation, pleural effusion, or pneumothorax. No acute osseous abnormality. Unchanged fractures of several sternal wires.  IMPRESSION: No active cardiopulmonary disease. Electronically Signed   By: Titus Dubin M.D.   On: 10/13/2018 14:21   Ct Angio Chest/abd/pel For Dissection W And/or Wo Contrast  Result Date: 10/13/2018 CLINICAL DATA:  Chest and abdominal pain. History of abdominal aortic aneurysm. EXAM: CT ANGIOGRAPHY CHEST, ABDOMEN AND PELVIS TECHNIQUE: Multidetector CT imaging through the chest, abdomen and pelvis was performed using the standard protocol during bolus administration of intravenous contrast. Multiplanar reconstructed images and MIPs were obtained and reviewed to evaluate the vascular anatomy. CONTRAST:  68m OMNIPAQUE IOHEXOL 350 MG/ML SOLN COMPARISON:  None. FINDINGS: CTA CHEST FINDINGS Cardiovascular: The thoracic aorta is normal in caliber and demonstrates no evidence of aneurysmal disease or dissection. Mild aortic atherosclerosis present. Proximal great vessels show no significant obstructive disease. The right subclavian artery is mildly dilated in caliber reaching maximal diameter of 13 mm. Evidence of prior CABG. The heart size is normal. No pericardial fluid identified. Central pulmonary arteries are normal in caliber. Mediastinum/Nodes: No enlarged mediastinal, hilar, or axillary lymph nodes. Thyroid gland, trachea, and esophagus demonstrate no significant findings. Lungs/Pleura: Bronchial thickening and mild bronchiectasis present in the posterior right lower lobe with some distal mucous plugging. Some component of acute bronchitis cannot be excluded. There is no evidence of pulmonary edema, pneumothorax, nodule or pleural fluid. Musculoskeletal: No chest wall abnormality. No acute or significant osseous findings. Review of the MIP images confirms the above findings. CTA ABDOMEN AND PELVIS FINDINGS VASCULAR Aorta: There is irregular predominately noncalcified plaque throughout the abdominal aorta. At the level of the inferior mesenteric artery origin, the aorta is minimally  dilated measuring  approximately 3.0 x 3.2 cm. No evidence of aortic dissection or rupture. Celiac: Mild irregular plaque in the proximal celiac trunk without significant stenosis. Distal branch vessels are normally patent. SMA: Normally patent. Renals: Single left renal artery is normally patent. The right renal artery has been ligated after prior nephrectomy. IMA: Normally patent. Inflow: Origin of the right common iliac artery demonstrates 50-60% stenosis. Both internal iliac arteries demonstrate diffuse proximal disease. No other significant iliac disease. Common femoral arteries and femoral bifurcations are normally patent. Review of the MIP images confirms the above findings. NON-VASCULAR Hepatobiliary: No focal liver abnormality is seen. No gallstones, gallbladder wall thickening, or biliary dilatation. Pancreas: Unremarkable. No pancreatic ductal dilatation or surrounding inflammatory changes. Spleen: Normal in size without focal abnormality. Adrenals/Urinary Tract: Status post right nephrectomy. Left kidney demonstrates no mass or hydronephrosis. No adrenal masses identified. Stomach/Bowel: Small a moderate hiatal hernia. No evidence of bowel obstruction, ileus, lesion or inflammation. No free air identified. Lymphatic: No enlarged lymph nodes identified. Reproductive: Prostate is unremarkable. Other: Bilateral small inguinal hernias contain fat. No ascites or focal abscess identified. Musculoskeletal: Degenerative disc disease at L4-5. Review of the MIP images confirms the above findings. IMPRESSION: 1. 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 2. No evidence of thoracic aortic aneurysmal disease or dissection. 3. 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 No evidence of abdominal aortic dissection or rupture. 4. 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 5. Status post right nephrectomy. 6. Moderate hiatal hernia. 7. Bilateral small  inguinal hernias containing fat. Electronically Signed   By: Aletta Edouard M.D.   On: 10/13/2018 15:38    Pending Labs [employer] (From admission, onward)    Start     Ordered   Signed and Held  CBC  (enoxaparin (LOVENOX)    CrCl >/= 30 ml/min)  Once,   R    Comments:  Baseline for enoxaparin therapy IF NOT ALREADY DRAWN.  Notify MD if PLT < 100 K.    Signed and Held   Signed and Held  Creatinine, serum  (enoxaparin (LOVENOX)    CrCl >/= 30 ml/min)  Once,   R    Comments:  Baseline for enoxaparin therapy IF NOT ALREADY DRAWN.    Signed and Held   Signed and Held  Creatinine, serum  (enoxaparin (LOVENOX)    CrCl >/= 30 ml/min)  Weekly,   R    Comments:  while on enoxaparin therapy    Signed and Held   Signed and Held  Basic metabolic panel  Tomorrow morning,   R     Signed and Held   Signed and Held  CBC  Tomorrow morning,   R     Signed and Held          Vitals/Pain Today's Vitals   10/13/18 1445 10/13/18 1500 10/13/18 1715 10/13/18 1745  BP: 115/74 107/76 (!) 135/98 108/68  Pulse: 64 61 68 (!) 54  Resp: 15 10 10 10   Temp:      TempSrc:      SpO2: 92% 96% 96% 99%  Weight:      Height:      PainSc:        Isolation Precautions No active isolations  Medications Medications  alum & mag hydroxide-simeth (MAALOX/MYLANTA) 200-200-20 MG/5ML suspension 30 mL (30 mLs Oral Given 10/13/18 1358)    And  lidocaine (XYLOCAINE) 2 % viscous mouth solution  15 mL (15 mLs Oral Given 10/13/18 1358)  iohexol (OMNIPAQUE) 350 MG/ML injection 80 mL (80 mLs Intravenous Contrast Given 10/13/18 1510)    Mobility walks Low fall risk   Focused Assessments Cardiac Assessment Handoff:  Cardiac Rhythm: Normal sinus rhythm Lab Results  Component Value Date   CKTOTAL 38 06/25/2010   CKMB 1.0 06/25/2010   TROPONINI 0.06 (HH) 10/13/2018   No results found for: DDIMER Does the Patient currently have chest pain? No     R Recommendations: See Admitting Provider Note  Report given  to: Otila Kluver  Additional Notes:

## 2018-10-13 NOTE — ED Provider Notes (Addendum)
[facility]ERGENCY DEPARTMENT Provider Note   CSN: [id number] Arrival date & time: 10/13/18  1226    History   Chief Complaint Chief Complaint  Patient presents with  . Chest Pain    HPI Matthew [name] is a 73 y.o. male.     The history is provided by the patient, medical records and a relative. No language interpreter was used.  Chest Pain  Pain location:  L chest and substernal area Pain quality: pressure, sharp and tightness   Pain radiates to:  Epigastrium Pain severity:  Severe Onset quality:  Sudden Duration:  2 days Timing:  Intermittent Progression:  Waxing and waning Chronicity:  New Context: eating and at rest   Relieved by:  Nitroglycerin Worsened by:  Exertion (eating) Ineffective treatments:  None tried Associated symptoms: abdominal pain (epigastric)   Associated symptoms: no back pain, no cough, no diaphoresis, no fatigue, no fever, no lower extremity edema, no nausea, no near-syncope, no palpitations, no shortness of breath, no syncope and no vomiting   Risk factors: aortic disease, coronary artery disease, diabetes mellitus, hypertension and male sex     Past Medical History:  Diagnosis Date  . AAA (abdominal aortic aneurysm) ([place])    a. Aortic US 05/2012: 3.9 cm fusiform abdominal aortic aneurysm. F/u recommended 05/2014.  Marland Kitchen Anemia   . Arthritis    DDD  . Atrial fibrillation ([place])    a. Post-op CABG 05/2009. b. Re-identified 12/2013.   Marland Kitchen CAD (coronary artery disease)    a. s/p CABGx4 05/2009. 10/10/16 PCI with DES x1 SVG-->OM2/OM3  . Chronic constipation   . CKD (chronic kidney disease), stage III ([place])   . Cocaine abuse in remission ([place])   . Colon polyps   . Depression   . Diabetes mellitus   . Empyema lung ([place])    a. PNA 02/2011 c/b empyema requiring chest tube, drainage of pleural effusion and decortication.  . Enlarged prostate   . Frequent falls   . GERD (gastroesophageal reflux disease)   . Headache   .  Hepatic steatosis   . Hyperlipidemia   . Hypertension   . Hypothyroidism   . Iron deficiency anemia   . LAFB (left anterior fascicular block)   . Lung cancer ([place])    a. Tx with interleukin in 2002 per patient.  . Myocardial infarction ([place])    10/08/16  . Nodule of left lung    a. Cavitary nodules 02/2011, f/u recommended.  . Obesity   . Paroxysmal supraventricular tachycardia ([place])    a. While inpatient 02/2011 sick with empyema - wide appearace due to RBBB.  . Pneumonia   . Post concussion syndrome   . RBBB   . Renal cell carcinoma    a. s/p R nephrectomy 2002. Reported pulmonary mets per note at that time.  . Scrotal abscess    a. 2012 2/2 Streptococcus group C, status post incision and drainage.  . Sleep apnea    wears CPAP  . 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  . Vitamin D deficiency   . Wears dentures     Patient Active Problem List   Diagnosis Date Noted  . OSA (obstructive sleep apnea) 08/22/2018  . Type 2 diabetes mellitus with vascular disease ([place]) 07/29/2017  . Post concussion syndrome 03/13/2017  . Post-concussion headache 03/13/2017  . Neck pain 03/13/2017  . Insomnia 03/13/2017  . Diabetes, polyneuropathy ([place]) 03/13/2017  . Unstable angina ([place])   .  NSTEMI (non-ST elevated myocardial infarction) ([place]) 10/08/2016  . Atrial fibrillation with RVR ([place]) 01/26/2016  . Atrial fibrillation with rapid ventricular response ([place]) 01/26/2016  . PAF (paroxysmal atrial fibrillation) ([place]) 12/21/2013  . Chronic abdominal pain 12/21/2013  . CKD (chronic kidney disease), stage III ([place]) 12/21/2013  . H/O: lung cancer 12/21/2013  . LV dysfunction 12/21/2013  . RBBB   . LAFB (left anterior fascicular block)   . AAA (abdominal aortic aneurysm) ([place])   . Chest pain 12/20/2013  . Mixed hyperlipidemia 10/14/2013  . Obesity, unspecified 10/14/2013  . Right Empyema    .  Left upper lobe lesion or nodule   . Hypertension   . CAD (coronary  artery disease)   . Renal cell carcinoma ([place])   . [place]   . Paroxysmal supraventricular tachycardia ([place])   . Hypothyroidism     Past Surgical History:  Procedure Laterality Date  . Bronchoscopy, right video-assisted thoracoscopy, minithoracotomy, drainage of pleural effusion, and decortication.  02/26/2011   [facility]  . CABG x 4  05/13/2009   [facility]  . CARDIAC SURGERY    . COLONOSCOPY W/ BIOPSIES AND POLYPECTOMY    . CORONARY STENT INTERVENTION N/A 10/10/2016   Procedure: Coronary Stent Intervention;  Surgeon: Troy Sine, MD;  Location: [facility];  Service: Cardiovascular;  Laterality: N/A;  . HEMORRHOID SURGERY    . HERNIA REPAIR    . Incision, drainage and debridement of perineum and  scrotum    . Irrigation and debridement of scrotal wound and closure of scrotal wound.    Marland Kitchen LEFT HEART CATH AND CORS/GRAFTS ANGIOGRAPHY N/A 10/10/2016   Procedure: Left Heart Cath and Cors/Grafts Angiography;  Surgeon: Troy Sine, MD;  Location: [facility];  Service: Cardiovascular;  Laterality: N/A;  . MULTIPLE TOOTH EXTRACTIONS    . RADIOLOGY WITH ANESTHESIA N/A 05/09/2017   Procedure: MRI WITH ANESTHESIA   BRAIN WITHOUT CONTRAST;  Surgeon: Radiologist, Medication, MD;  Location: [facility];  Service: Radiology;  Laterality: N/A;  . Right radical nephrectomy.    . TOOTH EXTRACTION          Home Medications    Prior to Admission medications   Medication Sig Start Date End Date Taking? Authorizing Provider  ALPRAZolam (XANAX) 0.25 MG tablet Take 1 tablet (0.25 mg total) by mouth at bedtime as needed for anxiety. 08/22/18   Sater, Nanine Means, MD  amiodarone (PACERONE) 200 MG tablet Take 0.5 tablets (100 mg total) by mouth 2 (two) times daily. 07/29/17   Annita Brod, MD  apixaban (ELIQUIS) 5 MG TABS tablet Take 5 mg by mouth 2 (two) times daily.    [provider]  buPROPion (WELLBUTRIN XL) 150 MG 24 hr tablet Take 150 mg by mouth daily.    [provider]  capsaicin (ZOSTRIX) 0.025 % cream Apply 1 application topically 2 (two) times daily as needed (for pain).     [provider]  cholecalciferol (VITAMIN D) 1000 units tablet Take 1,000 Units by mouth daily.    [provider]  clopidogrel (PLAVIX) 75 MG tablet Take 1 tablet (75 mg total) by mouth daily with breakfast. 10/12/16   Reino Bellis B, NP  clotrimazole (LOTRIMIN) 1 % cream Apply 1 application topically 2 (two) times daily as needed (for rash).     [provider]  erythromycin ophthalmic ointment Place a 1/2 inch ribbon of ointment into the right lower eyelid 4 times per day for 7 days. 04/19/18  Long, Wonda Olds, MD  ferrous sulfate 325 (65 FE) MG tablet Take 325 mg by mouth 3 (three) times a week. Tuesday, Wednesday and Friday    [provider]  finasteride (PROSCAR) 5 MG tablet Take 5 mg by mouth daily.    [provider]  gabapentin (NEURONTIN) 300 MG capsule Take 300 mg by mouth 2 (two) times daily.     [provider]  glucose 4 GM chewable tablet Chew 1 tablet by mouth as needed for low blood sugar.    [provider]  insulin aspart (NOVOLOG) 100 UNIT/ML injection Inject 25 Units into the skin 2 (two) times daily as needed for high blood sugar. 25 units before lunch and 25 units before dinner    [provider]  Insulin Detemir (LEVEMIR FLEXPEN) 100 UNIT/ML Pen Inject 50 Units into the skin 2 (two) times daily.     [provider]  isosorbide mononitrate (IMDUR) 60 MG 24 hr tablet Take 1 tablet (60 mg total) by mouth daily. Hold if blood pressure <100 07/30/17   Annita Brod, MD  ketoconazole (NIZORAL) 2 % cream Apply 1 application topically daily as needed. For rash     [provider]  levothyroxine (SYNTHROID, LEVOTHROID) 150 MCG tablet Take 150 mcg by mouth daily before breakfast.    [provider]  LIDOCAINE EX Apply 1 application topically 2 (two) times daily  as needed (for pain).     [provider]  Magnesium Oxide 420 MG TABS Take 420 mg by mouth daily.     [provider]  MENTHOL-METHYL SALICYLATE EX Apply 1 application topically as needed (pain).    [provider]  mesalamine (CANASA) 1000 MG suppository Place 1,000 mg rectally at bedtime as needed (ulcerative colitis flares).     [provider]  mesalamine (LIALDA) 1.2 g EC tablet Take 1.2 g by mouth daily as needed (ulcerative colitis flares).    [provider]  methylPREDNISolone (MEDROL) 4 MG tablet Take 4 mg by mouth 2 (two) times daily as needed (gout flares).     [provider]  metoprolol succinate (TOPROL-XL) 25 MG 24 hr tablet Take 12.5 mg by mouth daily.    [provider]  mupirocin ointment (BACTROBAN) 2 % Place 1 application into the nose daily as needed (for mersa).     [provider]  neomycin-bacitracin-polymyxin (NEOSPORIN) ointment Apply 1 application topically as needed for wound care.    [provider]  nitroGLYCERIN (NITROSTAT) 0.4 MG SL tablet Place 0.4 mg under the tongue every 5 (five) minutes as needed for chest pain (x 3 doses daily).     [provider]  nystatin cream (MYCOSTATIN) Apply 1 application topically daily as needed for dry skin (rash).    [provider]  Omega-3 Fatty Acids (FISH OIL) 1000 MG CAPS Take 2,000 mg by mouth 2 (two) times daily.     [provider]  oxyCODONE-acetaminophen (PERCOCET) 10-325 MG per tablet Take 1 tablet by mouth every 6 (six) hours as needed for pain.     [provider]  RABEprazole (ACIPHEX) 20 MG tablet Take 40 mg by mouth 2 (two) times daily before a meal.     [provider]  rosuvastatin (CRESTOR) 40 MG tablet Take 40 mg by mouth at bedtime.     [provider]  silver sulfADIAZINE (SILVADENE) 1 % cream Apply 1 application topically daily as needed (skin care).     [provider]   terbinafine (LAMISIL) 1 % cream Apply 1 application topically daily as needed (skin care).     [provider]  traZODone (DESYREL) 50 MG tablet Take 1 tablet (50 mg total) by mouth at bedtime. 02/21/18   Sater, Nanine Means, MD  triamcinolone cream (KENALOG) 0.1 % Apply 1 application topically daily as needed (rash).    [provider]  urea 10 % lotion Apply 1 application topically as needed for dry skin.    [provider]    Family History Family History  Problem Relation Age of Onset  . Heart disease Father   . Alzheimer's disease Mother   . Pneumonia Other   . Colon cancer Neg Hx   . Esophageal cancer Neg Hx   . Stomach cancer Neg Hx   . Rectal cancer Neg Hx     Social History Social History   Tobacco Use  . Smoking status: Never Smoker  . Smokeless tobacco: Never Used  Substance Use Topics  . Alcohol use: No  . Drug use: Yes    Types: Cocaine    Comment: stopped cocaine 2 and 1/2 years ago     Allergies   Tetanus toxoids   Review of Systems Review of Systems  Constitutional: Negative for chills, diaphoresis, fatigue and fever.  HENT: Negative for congestion.   Respiratory: Positive for chest tightness. Negative for cough, shortness of breath, wheezing and stridor.   Cardiovascular: Positive for chest pain. Negative for palpitations, leg swelling, syncope and near-syncope.  Gastrointestinal: Positive for abdominal pain (epigastric). Negative for constipation, diarrhea, nausea and vomiting.  Genitourinary: Negative for flank pain and frequency.  Musculoskeletal: Negative for back pain, neck pain and neck stiffness.  Skin: Negative for rash and wound.  Psychiatric/Behavioral: Negative for agitation.  All other systems reviewed and are negative.    Physical Exam Updated Vital Signs BP 119/72   Pulse 73   Temp 98.4 F (36.9 C) (Oral)   Resp 13   Ht 6' (1.829 m)   Wt 117.9 kg   SpO2 93%   BMI 35.26 kg/m   Physical Exam Vitals  signs and nursing note reviewed.  Constitutional:      General: He is not in acute distress.    Appearance: He is well-developed. He is not ill-appearing, toxic-appearing or diaphoretic.  HENT:     Head: Normocephalic and atraumatic.  Eyes:     Conjunctiva/sclera: Conjunctivae normal.     Pupils: Pupils are equal, round, and reactive to light.  Neck:     Musculoskeletal: Neck supple.  Cardiovascular:     Rate and Rhythm: Normal rate and regular rhythm.     Heart sounds: Murmur present.  Pulmonary:     Effort: Pulmonary effort is normal. No respiratory distress.     Breath sounds: Normal breath sounds. No decreased breath sounds, wheezing, rhonchi or rales.  Chest:     Chest wall: No tenderness.  Abdominal:     Palpations: Abdomen is soft.     Tenderness: There is no abdominal tenderness.  Musculoskeletal:     Right lower leg: He exhibits no tenderness. No edema.     Left lower leg: No edema.  Skin:    General: Skin is warm and dry.     Capillary Refill: Capillary refill takes less than 2 seconds.  Neurological:     General: No focal deficit present.     Mental Status: He is alert.  Psychiatric:  Mood and Affect: Mood normal.      ED Treatments / Results  Labs (all labs ordered are listed, but only abnormal results are displayed) Labs Reviewed  CBC WITH DIFFERENTIAL/PLATELET - Abnormal; Notable for the following components:      Result Value   Platelets 82 (*)    All other components within normal limits  COMPREHENSIVE METABOLIC PANEL - Abnormal; Notable for the following components:   Glucose, Bld 257 (*)    Creatinine, Ser 1.89 (*)    Total Protein 6.2 (*)    Albumin 3.1 (*)    Alkaline Phosphatase 29 (*)    GFR calc non Af Amer 34 (*)    GFR calc Af Amer 40 (*)    All other components within normal limits  TROPONIN I - Abnormal; Notable for the following components:   Troponin I 0.06 (*)    All other components within normal limits  LIPASE, BLOOD     EKG EKG Interpretation  Date/Time:  Monday Oct 13 2018 12:36:56 EDT Ventricular Rate:  81 PR Interval:    QRS Duration: 137 QT Interval:  436 QTC Calculation: 507 R Axis:   -86 Text Interpretation:  Sinus rhythm Right bundle branch block Inferior infarct, old Anterior infarct, age indeterminate When compared to prior, no significant cahnges seen.  No SETMI Confirmed by Antony Blackbird [phone]) on 10/13/2018 12:40:44 PM   Radiology Dg Chest 2 View  Result Date: 10/13/2018 CLINICAL DATA:  Left-sided chest pain for the past 2 days. EXAM: CHEST - 2 VIEW COMPARISON:  Chest x-ray dated July 27, 2018. FINDINGS: Stable cardiomediastinal silhouette status post CABG. Normal pulmonary vascularity. Progressive linear scarring/atelectasis in the right lower lobe. No focal consolidation, pleural effusion, or pneumothorax. No acute osseous abnormality. Unchanged fractures of several sternal wires. IMPRESSION: No active cardiopulmonary disease. Electronically Signed   By: Titus Dubin M.D.   On: 10/13/2018 14:21   Ct Angio Chest/abd/pel For Dissection W And/or Wo Contrast  Result Date: 10/13/2018 CLINICAL DATA:  Chest and abdominal pain. History of abdominal aortic aneurysm. EXAM: CT ANGIOGRAPHY CHEST, ABDOMEN AND PELVIS TECHNIQUE: Multidetector CT imaging through the chest, abdomen and pelvis was performed using the standard protocol during bolus administration of intravenous contrast. Multiplanar reconstructed images and MIPs were obtained and reviewed to evaluate the vascular anatomy. CONTRAST:  88m OMNIPAQUE IOHEXOL 350 MG/ML SOLN COMPARISON:  None. FINDINGS: CTA CHEST FINDINGS Cardiovascular: The thoracic aorta is normal in caliber and demonstrates no evidence of aneurysmal disease or dissection. Mild aortic atherosclerosis present. Proximal great vessels show no significant obstructive disease. The right subclavian artery is mildly dilated in caliber reaching maximal diameter of 13 mm. Evidence of prior  CABG. The heart size is normal. No pericardial fluid identified. Central pulmonary arteries are normal in caliber. Mediastinum/Nodes: No enlarged mediastinal, hilar, or axillary lymph nodes. Thyroid gland, trachea, and esophagus demonstrate no significant findings. Lungs/Pleura: Bronchial thickening and mild bronchiectasis present in the posterior right lower lobe with some distal mucous plugging. Some component of acute bronchitis cannot be excluded. There is no evidence of pulmonary edema, pneumothorax, nodule or pleural fluid. Musculoskeletal: No chest wall abnormality. No acute or significant osseous findings. Review of the MIP images confirms the above findings. CTA ABDOMEN AND PELVIS FINDINGS VASCULAR Aorta: There is irregular predominately noncalcified plaque throughout the abdominal aorta. At the level of the inferior mesenteric artery origin, the aorta is minimally dilated measuring approximately 3.0 x 3.2 cm. No evidence of aortic dissection or rupture. Celiac: Mild irregular plaque  in the proximal celiac trunk without significant stenosis. Distal branch vessels are normally patent. SMA: Normally patent. Renals: Single left renal artery is normally patent. The right renal artery has been ligated after prior nephrectomy. IMA: Normally patent. Inflow: Origin of the right common iliac artery demonstrates 50-60% stenosis. Both internal iliac arteries demonstrate diffuse proximal disease. No other significant iliac disease. Common femoral arteries and femoral bifurcations are normally patent. Review of the MIP images confirms the above findings. NON-VASCULAR Hepatobiliary: No focal liver abnormality is seen. No gallstones, gallbladder wall thickening, or biliary dilatation. Pancreas: Unremarkable. No pancreatic ductal dilatation or surrounding inflammatory changes. Spleen: Normal in size without focal abnormality. Adrenals/Urinary Tract: Status post right nephrectomy. Left kidney demonstrates no mass or  hydronephrosis. No adrenal masses identified. Stomach/Bowel: Small a moderate hiatal hernia. No evidence of bowel obstruction, ileus, lesion or inflammation. No free air identified. Lymphatic: No enlarged lymph nodes identified. Reproductive: Prostate is unremarkable. Other: Bilateral small inguinal hernias contain fat. No ascites or focal abscess identified. Musculoskeletal: Degenerative disc disease at L4-5. Review of the MIP images confirms the above findings. IMPRESSION: 1. 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 2. No evidence of thoracic aortic aneurysmal disease or dissection. 3. 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 No evidence of abdominal aortic dissection or rupture. 4. 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 5. Status post right nephrectomy. 6. Moderate hiatal hernia. 7. Bilateral small inguinal hernias containing fat. Electronically Signed   By: Aletta Edouard M.D.   On: 10/13/2018 15:38    Procedures Procedures (including critical care time)  Medications Ordered in ED Medications  alum & mag hydroxide-simeth (MAALOX/MYLANTA) 200-200-20 MG/5ML suspension 30 mL (30 mLs Oral Given 10/13/18 1358)    And  lidocaine (XYLOCAINE) 2 % viscous mouth solution 15 mL (15 mLs Oral Given 10/13/18 1358)  iohexol (OMNIPAQUE) 350 MG/ML injection 80 mL (80 mLs Intravenous Contrast Given 10/13/18 1510)     CRITICAL CARE Performed by: Gwenyth Allegra  Total critical care time: 35 minutes Critical care time was exclusive of separately billable procedures and treating other patients. Critical care was necessary to treat or prevent imminent or life-threatening deterioration. Critical care was time spent personally by me on the following activities: development of treatment plan with patient and/or surrogate as well as nursing, discussions with consultants, evaluation of patient's response to treatment,  examination of patient, obtaining history from patient or surrogate, ordering and performing treatments and interventions, ordering and review of laboratory studies, ordering and review of radiographic studies, pulse oximetry and re-evaluation of patient's condition.  Matthew [name] was evaluated in Emergency Department on 10/13/2018 for the symptoms described in the history of present illness. He was evaluated in the context of the global COVID-19 pandemic, which necessitated consideration that the patient might be at risk for infection with the SARS-CoV-2 virus that causes COVID-19. Institutional protocols and algorithms that pertain to the evaluation of patients at risk for COVID-19 are in a state of rapid change based on information released by regulatory bodies including the CDC and federal and state organizations. These policies and algorithms were followed during the patient's care in the ED.   Initial Impression / Assessment and Plan / ED Course  I have reviewed the triage vital signs and the nursing notes.  Pertinent labs & imaging results that were available during my care of the patient were reviewed by me and considered in my medical decision making (  see chart for details).  Clinical Course as of Oct 13 1108  Mon Oct 13, 2018  1659 Consult pending from cards. Waiting on cards dispo.    [KM]  2263 Patient accepted by hospitalist for admission   [KM]    Clinical Course User Index [KM] Alveria Apley, PA-C       Matthew [name] is a 73 y.o. male with a past medical history significant for CAD status post PCI, paroxysmal atrial fibrillation on Eliquis and Plavix, known AAA, hypertension, diabetes, lung cancer, GERD who presents with chest pain.  Patient reports that for the last 2 days he has been having pain in his central left chest.  He reports that sometimes radiates to his upper abdomen.  He reports that his severe up to 9 out of 10 in severity.  He reports that he took 4  nitroglycerin last night and did resolve his pain.  He reports that it is worse after eating however it is a persistent pressure that can last for several hours.  He reports no associated shortness of breath, diaphoresis, nausea, or vomiting.  He denies fevers or chills.  No constipation or diarrhea.  No urinary symptoms.  No flank or other abdominal pain.  He reports he is unsure if this feels like prior MI or not.  He reports it was worsened with some exertion as well.  He denies other complaints on arrival.  On exam, patient has a systolic murmur.  Lungs are clear and chest and abdomen are nontender.  No flank tenderness.  Patient has palpable pulses in his lower extremities and radial pulses.  Patient had blood pressure around 100 on my initial evaluation.  He is afebrile.  He is not tachycardic.  Oxygen saturations are in the mid 90s.  EKG shows no STEMI.   Given his history of AAA and chest pain radiating to his upper abdomen, patient will need rule out for dissection or other AAA worsening.  Will obtain CTA of his chest abdomen and pelvis.  With his history of CAD and PCI with several hours of chest pressure resolved with nitroglycerin, anticipate will need to touch base with cardiology if his CTA is reassuring.  He is currently chest pain-free while he waits for his work-up.  We will get other laboratory work-up as well.  Anticipate reassessment after work-up.  Work-up began to return.  Metabolic panel showed kidney function increased at 1.89.  CBC shows no leukocytosis or anemia however he does have thrombocytopenia.  Troponin is elevated from prior at 0.06.  In the setting of his cardiac history and chest pain, patient will likely admission regardless of CT results.  Care transferred to Dr. Vanita Panda while awaiting results of CT aorta.  If there is no dissection or significant abnormalities on his imaging, cardiology will need to be called for admission for high risk chest pain with positive  troponin.  Anticipate admission.    Final Clinical Impressions(s) / ED Diagnoses   Final diagnoses:  Precordial pain  Elevated troponin    ED Discharge Orders    None      Clinical Impression: 1. Precordial pain   2. Elevated troponin     Disposition: Care transferred to Dr. Vanita Panda while awaiting imaging results.  Anticipate admission for high risk chest pain with positive troponin.   This note was prepared with assistance of Systems analyst. Occasional wrong-word or sound-a-like substitutions may have occurred due to the inherent limitations of voice recognition software.  , Gwenyth Allegra, MD 10/13/18 1557    , Gwenyth Allegra, MD 10/14/18 1110

## 2018-10-13 NOTE — ED Notes (Signed)
Date and time results received: 10/13/18 1522 (use smartphrase ".now" to insert current time)  Test: troponin Critical Value: 0.06 Name of Provider Notified: [place]  Orders Received? Or Actions Taken?:

## 2018-10-13 NOTE — ED Notes (Signed)
Doctor and Nurse navigator spoke with wife and patient updating plan of care.

## 2018-10-13 NOTE — ED Provider Notes (Signed)
  Physical Exam  BP 107/76   Pulse 61   Temp 98.4 F (36.9 C) (Oral)   Resp 10   Ht 6' (1.829 m)   Wt 117.9 kg   SpO2 96%   BMI 35.26 kg/m   Physical Exam  ED Course/Procedures   Clinical Course as of Oct 12 1748  Mon Oct 13, 2018  1659 Consult pending from cards. Waiting on cards dispo.    [KM]  2552 Patient accepted by hospitalist for admission   [KM]    Clinical Course User Index [KM] Alveria Apley, PA-C    Procedures  MDM  73-year-old gentleman presenting with chest pain or shortness of breath.  Left-sided and substernal.  Pending cardiology consult at this time.  Patient is stable.  Patient will be admitted but will either be admitted to cardiology or hospitalist.  Pending at this time. Patient to be accepted by the hospitalist for admission for trending troponins and repeat echo.  Patient currently pain-free.  No heparin initiated per cardiology   Alveria Apley, PA-C 10/13/18 1750    Carmin Muskrat, MD 10/13/18 2357

## 2018-10-13 NOTE — Consult Note (Signed)
Consult:   Patient ID: Matthew [name] MRN: [id number]; DOB: [birth year]   Admission date: 10/13/2018  Primary Care Provider: Marda Stalker, PA-C Primary Cardiologist: Larae Grooms, MD [facility] [place] Primary Electrophysiologist:  None   Chief Complaint:  Chest pain  Patient Profile:   Matthew [name] is a 73 y.o. male with PMH of CAD s/p CABG x4 '10, PAF, AAA, CKD, DM, renal Ca s/p right nephrectomy and carotid artery disease who presented with chest pain.   History of Present Illness:   Matthew [name] is a 73 year old male with past medical history noted above.  He has a past medical history of CAD with CABG x4 in 2010.  Also has a known history of paroxysmal atrial fibrillation of which he has been on Eliquis as well as amiodarone for rate control.  Presented to the [facility] long ED in April 2018 with chest discomfort and ruled in for a NSTEMI with a troponin peaking at 0.55.  Underwent cardiac catheterization with PCI/DES to SVG to OM 2/OM 3 which was 95% stenosis.  Echocardiogram at that time showed normal LVEF.  He was placed on triple therapy with aspirin Plavix and Eliquis for 1 month.  Then continued on Plavix and Eliquis.  He was last seen in the office on 10/17/2016 for post hospital follow-up.  Reported doing well no symptoms.  Tolerating all medications.  Presented again to the hospital on 07/29/2017 with chest pain. At that time he had pulled out and EKG was negative for ischemic changes.  Options were discussed with the patient and he did not want to proceed with cardiac catheterization at that time.  He was continued on Plavix.  Home meds were adjusted to increase his Imdur to 60 mg daily, and he was continued on Crestor and Toprol-XL.  He also noted at that time that his cardiac care was fully managed by the [place].  Plans to arrange follow-up to see his cardiologist there in 2 weeks.  He presented to the ED with several days of chest pain. Mostly in the left chest but  with radiation to the upper abd at times. Did take total of 4 SL nitro with relief of symptoms. Did associate chest pain with eating at times but also noted while at rest and exertion. He is unable to say whether this is similar to his previous cardiac episodes.   In the ED his labs showed stable electrolytes, Cr 1.89, Trop I 0.06, Hgb 13.7, Platelets 82,000. EKG showed RBBB. CXR neg. CT chest with no evidence of dissection, or rupture,   Past Medical History:  Diagnosis Date   AAA (abdominal aortic aneurysm) ([place])    a. Aortic US 05/2012: 3.9 cm fusiform abdominal aortic aneurysm. F/u recommended 05/2014.   Anemia    Arthritis    DDD   Atrial fibrillation ([place])    a. Post-op CABG 05/2009. b. Re-identified 12/2013.    CAD (coronary artery disease)    a. s/p CABGx4 05/2009. 10/10/16 PCI with DES x1 SVG-->OM2/OM3   Chronic constipation    CKD (chronic kidney disease), stage III (HCC)    Cocaine abuse in remission [facility])    Colon polyps    Depression    Diabetes mellitus    Empyema lung ([place])    a. PNA 02/2011 c/b empyema requiring chest tube, drainage of pleural effusion and decortication.   Enlarged prostate    Frequent falls    GERD (gastroesophageal reflux disease)    Headache    Hepatic steatosis  Hyperlipidemia    Hypertension    Hypothyroidism    Iron deficiency anemia    LAFB (left anterior fascicular block)    Lung cancer ([place])    a. Tx with interleukin in 2002 per patient.   Myocardial infarction ([place])    10/08/16   Nodule of left lung    a. Cavitary nodules 02/2011, f/u recommended.   Obesity    Paroxysmal supraventricular tachycardia ([place])    a. While inpatient 02/2011 sick with empyema - wide appearace due to RBBB.   Pneumonia    Post concussion syndrome    RBBB    Renal cell carcinoma    a. s/p R nephrectomy 2002. Reported pulmonary mets per note at that time.   Scrotal abscess    a. 2012 2/2 Streptococcus group C, status post  incision and drainage.   Sleep apnea    wears CPAP   Ulcerative proctitis ([place])    a. Remote dx dating back to 1979.   Vitamin D deficiency    Wears dentures     Past Surgical History:  Procedure Laterality Date   Bronchoscopy, right video-assisted thoracoscopy, minithoracotomy, drainage of pleural effusion, and decortication.  02/26/2011   [place]   CABG x 4  05/13/2009   [place]   CARDIAC SURGERY     COLONOSCOPY W/ BIOPSIES AND POLYPECTOMY     CORONARY STENT INTERVENTION N/A 10/10/2016   Procedure: Coronary Stent Intervention;  Surgeon: Troy Sine, MD;  Location: [place] CV LAB;  Service: Cardiovascular;  Laterality: N/A;   HEMORRHOID SURGERY     HERNIA REPAIR     Incision, drainage and debridement of perineum and  scrotum     Irrigation and debridement of scrotal wound and closure of scrotal wound.     LEFT HEART CATH AND CORS/GRAFTS ANGIOGRAPHY N/A 10/10/2016   Procedure: Left Heart Cath and Cors/Grafts Angiography;  Surgeon: Troy Sine, MD;  Location: [place] CV LAB;  Service: Cardiovascular;  Laterality: N/A;   MULTIPLE TOOTH EXTRACTIONS     RADIOLOGY WITH ANESTHESIA N/A 05/09/2017   Procedure: MRI WITH ANESTHESIA   BRAIN WITHOUT CONTRAST;  Surgeon: Radiologist, Medication, MD;  Location: [place];  Service: Radiology;  Laterality: N/A;   Right radical nephrectomy.     TOOTH EXTRACTION       Medications Prior to Admission: Prior to Admission medications   Medication Sig Start Date End Date Taking? Authorizing Provider  ALPRAZolam (XANAX) 0.25 MG tablet Take 1 tablet (0.25 mg total) by mouth at bedtime as needed for anxiety. Patient taking differently: Take 0.25 mg by mouth at bedtime as needed for anxiety or sleep.  08/22/18  Yes Sater, Nanine Means, MD  traZODone (DESYREL) 50 MG tablet Take 1 tablet (50 mg total) by mouth at bedtime. 02/21/18  Yes Sater, Nanine Means, MD  amiodarone (PACERONE) 200 MG tablet Take 0.5 tablets (100 mg total) by mouth 2  (two) times daily. Patient not taking: Reported on 10/13/2018 07/29/17   Annita Brod, MD  apixaban (ELIQUIS) 5 MG TABS tablet Take 5 mg by mouth 2 (two) times daily.    [provider]  buPROPion (WELLBUTRIN XL) 150 MG 24 hr tablet Take 150 mg by mouth daily.    [provider]  capsaicin (ZOSTRIX) 0.025 % cream Apply 1 application topically 2 (two) times daily as needed (for pain).     [provider]  cholecalciferol (VITAMIN D) 1000 units tablet Take 1,000 Units by mouth daily.    [provider]  clopidogrel (PLAVIX) 75 MG tablet Take 1 tablet (75 mg total) by mouth daily with breakfast. 10/12/16   Reino Bellis B, NP  clotrimazole (LOTRIMIN) 1 % cream Apply 1 application topically 2 (two) times daily as needed (for rash).     [provider]  erythromycin ophthalmic ointment Place a 1/2 inch ribbon of ointment into the right lower eyelid 4 times per day for 7 days. 04/19/18   Long, Wonda Olds, MD  ferrous sulfate 325 (65 FE) MG tablet Take 325 mg by mouth 3 (three) times a week. Tuesday, Wednesday and Friday    [provider]  finasteride (PROSCAR) 5 MG tablet Take 5 mg by mouth daily.    [provider]  gabapentin (NEURONTIN) 300 MG capsule Take 300 mg by mouth 2 (two) times daily.     [provider]  glucose 4 GM chewable tablet Chew 1 tablet by mouth as needed for low blood sugar.    [provider]  insulin aspart (NOVOLOG) 100 UNIT/ML injection Inject 25 Units into the skin 2 (two) times daily as needed for high blood sugar. 25 units before lunch and 25 units before dinner    [provider]  Insulin Detemir (LEVEMIR FLEXPEN) 100 UNIT/ML Pen Inject 50 Units into the skin 2 (two) times daily.     [provider]  isosorbide mononitrate (IMDUR) 60 MG 24 hr tablet Take 1 tablet (60 mg total) by mouth daily. Hold if blood pressure <100 07/30/17   Annita Brod, MD  ketoconazole (NIZORAL)  2 % cream Apply 1 application topically daily as needed. For rash     [provider]  levothyroxine (SYNTHROID, LEVOTHROID) 150 MCG tablet Take 150 mcg by mouth daily before breakfast.    [provider]  LIDOCAINE EX Apply 1 application topically 2 (two) times daily as needed (for pain).     [provider]  Magnesium Oxide 420 MG TABS Take 420 mg by mouth daily.     [provider]  MENTHOL-METHYL SALICYLATE EX Apply 1 application topically as needed (pain).    [provider]  mesalamine (CANASA) 1000 MG suppository Place 1,000 mg rectally at bedtime as needed (ulcerative colitis flares).     [provider]  mesalamine (LIALDA) 1.2 g EC tablet Take 1.2 g by mouth daily as needed (ulcerative colitis flares).    [provider]  methylPREDNISolone (MEDROL) 4 MG tablet Take 4 mg by mouth 2 (two) times daily as needed (gout flares).     [provider]  metoprolol succinate (TOPROL-XL) 25 MG 24 hr tablet Take 12.5 mg by mouth daily.    [provider]  mupirocin ointment (BACTROBAN) 2 % Place 1 application into the nose daily as needed (for mersa).     [provider]  neomycin-bacitracin-polymyxin (NEOSPORIN) ointment Apply 1 application topically as needed for wound care.    [provider]  nitroGLYCERIN (NITROSTAT) 0.4 MG SL tablet Place 0.4 mg under the tongue every 5 (five) minutes as needed for chest pain (x 3 doses daily).     [provider]  nystatin cream (MYCOSTATIN) Apply 1 application topically daily as needed for dry skin (rash).    [provider]  Omega-3 Fatty Acids (FISH OIL) 1000 MG CAPS Take 2,000 mg by mouth 2 (two) times daily.     [provider]  oxyCODONE-acetaminophen (PERCOCET) 10-325 MG per tablet Take 1 tablet by mouth every 6 (six) hours  as needed for pain.     [provider]  RABEprazole (ACIPHEX) 20 MG tablet Take 40 mg by mouth 2  (two) times daily before a meal.     [provider]  rosuvastatin (CRESTOR) 40 MG tablet Take 40 mg by mouth at bedtime.     [provider]  silver sulfADIAZINE (SILVADENE) 1 % cream Apply 1 application topically daily as needed (skin care).     [provider]  terbinafine (LAMISIL) 1 % cream Apply 1 application topically daily as needed (skin care).     [provider]  triamcinolone cream (KENALOG) 0.1 % Apply 1 application topically daily as needed (rash).    [provider]  urea 10 % lotion Apply 1 application topically as needed for dry skin.    [provider]     Allergies:    Allergies  Allergen Reactions   Amiodarone Other (See Comments)    Caused lung and liver toxicity   Tetanus Toxoids Hives    Social History:   Social History   Socioeconomic History   Marital status: Married    Spouse name: Not on file   Number of children: 4   Years of education: Not on file   Highest education level: Not on file  Occupational History   Occupation: CAR SERVICE    Employer: SELF EMPLOYED  Social Designer, fashion/clothing strain: Not on file   Food insecurity:    Worry: Not on file    Inability: Not on file   Transportation needs:    Medical: Not on file    Non-medical: Not on file  Tobacco Use   Smoking status: Never Smoker   Smokeless tobacco: Never Used  Substance and Sexual Activity   Alcohol use: No   Drug use: Yes    Types: Cocaine    Comment: stopped cocaine 2 and 1/2 years ago   Sexual activity: Not on file  Lifestyle   Physical activity:    Days per week: Not on file    Minutes per session: Not on file   Stress: Not on file  Relationships   Social connections:    Talks on phone: Not on file    Gets together: Not on file    Attends religious service: Not on file    Active member of club or organization: Not on file    Attends meetings of clubs or organizations: Not on file     Relationship status: Not on file   Intimate partner violence:    Fear of current or ex partner: Not on file    Emotionally abused: Not on file    Physically abused: Not on file    Forced sexual activity: Not on file  Other Topics Concern   Not on file  Social History Narrative   Not on file    Family History:   The patient's family history includes Alzheimer's disease in his mother; Heart disease in his father; Pneumonia in an other family member. There is no history of Colon cancer, Esophageal cancer, Stomach cancer, or Rectal cancer.    ROS:  Please see the history of present illness.  All other ROS reviewed and negative.     Physical Exam/Data:   Vitals:   10/13/18 1415 10/13/18 1430 10/13/18 1445 10/13/18 1500  BP: 116/74 121/80 115/74 107/76  Pulse: 71 66 64 61  Resp: 12 15 15 10   Temp:      TempSrc:  SpO2: 96% 95% 92% 96%  Weight:      Height:       No intake or output data in the 24 hours ending 10/13/18 1618 Last 3 Weights 10/13/2018 08/22/2018 07/27/2018  Weight (lbs) 260 lb 264 lb 8 oz 259 lb  Weight (kg) 117.935 kg 119.976 kg 117.482 kg     Body mass index is 35.26 kg/m.   Physical Exam per MD.  General:  Well nourished, well developed, in no acute distress HEENT: normal Lymph: no adenopathy Neck: no JVD Endocrine:  No thryomegaly Vascular: No carotid bruits; FA pulses 2+ bilaterally without bruits  Cardiac:  normal S1, S2; RRR; no murmur  Lungs:  clear to auscultation bilaterally, no wheezing, rhonchi or rales  Abd: soft, nontender, no hepatomegaly  Ext: no edema Musculoskeletal:  No deformities, BUE and BLE strength normal and equal Skin: warm and dry  Neuro:  CNs 2-12 intact, no focal abnormalities noted Psych:  Normal affect    EKG:  The ECG that was done 10/13/18 was personally reviewed and demonstrates SR with RBBB  Relevant CV Studies:  TEE: 10/11/16  Study Conclusions  - Left ventricle: The cavity size was normal. Wall thickness  was   increased in a pattern of moderate LVH. Systolic function was   normal. The estimated ejection fraction was in the range of 55%   to 60%. Features are consistent with a pseudonormal left   ventricular filling pattern, with concomitant abnormal relaxation   and increased filling pressure (grade 2 diastolic dysfunction). - Mitral valve: Calcified annulus. Mildly thickened leaflets .   There was mild regurgitation. - Left atrium: The atrium was mildly dilated. - Impressions: Poor acoustic windows limit study.  Cath: 10/10/16   Prox LAD to Mid LAD lesion, 100 %stenosed.  Prox RCA to Mid RCA lesion, 100 %stenosed.  Mid Cx lesion, 95 %stenosed.  Ost 2nd Mrg to 2nd Mrg lesion, 100 %stenosed.  Dist Cx lesion, 100 %stenosed.  LIMA and is normal in caliber and anatomically normal.  SVG and is normal in caliber and anatomically normal.  Seq SVG- OM2-OM3 and is normal in caliber.  Dist Graft-2 lesion, 50 %stenosed.  SVG.  Origin lesion, 100 %stenosed.  A STENT SYNERGY DES 3X28 drug eluting stent was successfully placed.  Dist Graft-1 lesion, 95 %stenosed.  Post intervention, there is a 0% residual stenosis.  Prox Graft to Mid Graft lesion, 25 %stenosed.   Severe native CAD with total occlusion of the proximal LAD at a site of prior proximal stenting after a small diagonal vessel; subtotal occlusion of the circumflex vessel prior to the OM2, with occlusion of the ostium of the OM1, and diffuse 95% mid AV groove stenosis with total distal occlusion before the distal marginal branch; and total proximal RCA occlusion.  Patent LIMA graft which supplies the mid LAD and 2 diagonal vessels.  The sequential vein graft which supplies a large OM 2 - OM3  has mild diffuse 20-30% stenosis proximally in the graft and in the mid body of the graft there is a sequential stenosis of 95%, followed by eccentric 50% narrowing with TIMI-3 flow down both marginal branches.  Ostially occluded  vein graft which has supplied a diagonal vessel.  Patent SVG to the distal RCA.  Successful PCI to the sequential SVG supplying the marginal vessels with ultimate insertion of a 3.028 mm Synergy DES stent to cover both lesions, postdilated to 3.18 mm with the 95 and 50% stenoses being reduced to 0%.  There is brisk TIMI-3 flow down to both marginal branches.  RECOMMENDATION: The patient has a history of atrial fibrillation but is in sinus rhythm today.  Eliquis will most likely be reinstituted and the patient will be on short-term triple drug therapy with ultimate plans for Plavix/eliquis.  Diagnostic  Dominance: Right    Intervention     Laboratory Data:  Chemistry Recent Labs  Lab 10/13/18 1343  NA 137  K 4.7  CL 104  CO2 22  GLUCOSE 257*  BUN 16  CREATININE 1.89*  CALCIUM 8.9  GFRNONAA 34*  GFRAA 40*  ANIONGAP 11    Recent Labs  Lab 10/13/18 1343  PROT 6.2*  ALBUMIN 3.1*  AST 26  ALT 19  ALKPHOS 29*  BILITOT 1.1   Hematology Recent Labs  Lab 10/13/18 1343  WBC 7.3  RBC 4.46  HGB 13.7  HCT 43.0  MCV 96.4  MCH 30.7  MCHC 31.9  RDW 13.4  PLT 82*   Cardiac Enzymes Recent Labs  Lab 10/13/18 1343  TROPONINI 0.06*   No results for input(s): TROPIPOC in the last 168 hours.  BNPNo results for input(s): BNP, PROBNP in the last 168 hours.  DDimer No results for input(s): DDIMER in the last 168 hours.  Radiology/Studies:  Dg Chest 2 View  Result Date: 10/13/2018 CLINICAL DATA:  Left-sided chest pain for the past 2 days. EXAM: CHEST - 2 VIEW COMPARISON:  Chest x-ray dated July 27, 2018. FINDINGS: Stable cardiomediastinal silhouette status post CABG. Normal pulmonary vascularity. Progressive linear scarring/atelectasis in the right lower lobe. No focal consolidation, pleural effusion, or pneumothorax. No acute osseous abnormality. Unchanged fractures of several sternal wires. IMPRESSION: No active cardiopulmonary disease. Electronically Signed   By:  Titus Dubin M.D.   On: 10/13/2018 14:21   Ct Angio Chest/abd/pel For Dissection W And/or Wo Contrast  Result Date: 10/13/2018 CLINICAL DATA:  Chest and abdominal pain. History of abdominal aortic aneurysm. EXAM: CT ANGIOGRAPHY CHEST, ABDOMEN AND PELVIS TECHNIQUE: Multidetector CT imaging through the chest, abdomen and pelvis was performed using the standard protocol during bolus administration of intravenous contrast. Multiplanar reconstructed images and MIPs were obtained and reviewed to evaluate the vascular anatomy. CONTRAST:  36m OMNIPAQUE IOHEXOL 350 MG/ML SOLN COMPARISON:  None. FINDINGS: CTA CHEST FINDINGS Cardiovascular: The thoracic aorta is normal in caliber and demonstrates no evidence of aneurysmal disease or dissection. Mild aortic atherosclerosis present. Proximal great vessels show no significant obstructive disease. The right subclavian artery is mildly dilated in caliber reaching maximal diameter of 13 mm. Evidence of prior CABG. The heart size is normal. No pericardial fluid identified. Central pulmonary arteries are normal in caliber. Mediastinum/Nodes: No enlarged mediastinal, hilar, or axillary lymph nodes. Thyroid gland, trachea, and esophagus demonstrate no significant findings. Lungs/Pleura: Bronchial thickening and mild bronchiectasis present in the posterior right lower lobe with some distal mucous plugging. Some component of acute bronchitis cannot be excluded. There is no evidence of pulmonary edema, pneumothorax, nodule or pleural fluid. Musculoskeletal: No chest wall abnormality. No acute or significant osseous findings. Review of the MIP images confirms the above findings. CTA ABDOMEN AND PELVIS FINDINGS VASCULAR Aorta: There is irregular predominately noncalcified plaque throughout the abdominal aorta. At the level of the inferior mesenteric artery origin, the aorta is minimally dilated measuring approximately 3.0 x 3.2 cm. No evidence of aortic dissection or rupture. Celiac:  Mild irregular plaque in the proximal celiac trunk without significant stenosis. Distal branch vessels are normally patent. SMA: Normally patent. Renals: Single left  renal artery is normally patent. The right renal artery has been ligated after prior nephrectomy. IMA: Normally patent. Inflow: Origin of the right common iliac artery demonstrates 50-60% stenosis. Both internal iliac arteries demonstrate diffuse proximal disease. No other significant iliac disease. Common femoral arteries and femoral bifurcations are normally patent. Review of the MIP images confirms the above findings. NON-VASCULAR Hepatobiliary: No focal liver abnormality is seen. No gallstones, gallbladder wall thickening, or biliary dilatation. Pancreas: Unremarkable. No pancreatic ductal dilatation or surrounding inflammatory changes. Spleen: Normal in size without focal abnormality. Adrenals/Urinary Tract: Status post right nephrectomy. Left kidney demonstrates no mass or hydronephrosis. No adrenal masses identified. Stomach/Bowel: Small a moderate hiatal hernia. No evidence of bowel obstruction, ileus, lesion or inflammation. No free air identified. Lymphatic: No enlarged lymph nodes identified. Reproductive: Prostate is unremarkable. Other: Bilateral small inguinal hernias contain fat. No ascites or focal abscess identified. Musculoskeletal: Degenerative disc disease at L4-5. Review of the MIP images confirms the above findings. IMPRESSION: 1. 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 2. No evidence of thoracic aortic aneurysmal disease or dissection. 3. 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 No evidence of abdominal aortic dissection or rupture. 4. 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 5. Status post right nephrectomy. 6. Moderate hiatal hernia. 7. Bilateral small inguinal hernias containing fat. Electronically Signed   By: Aletta Edouard M.D.    On: 10/13/2018 15:38    Assessment and Plan:   Matthew [name] is a 73 y.o. male with PMH of CAD s/p CABG x4 '10, PAF, AAA, CKD, DM, renal Ca s/p right nephrectomy and carotid artery disease who presented with chest pain.   1. Chest pain: symptoms are somewhat atypical with upper abd pain, though improved with SL nitro. Trop 0.06, and EKG without ischemia. Cr up at 1.8, platelet count 82,000. Given his hx of renal cell ca and right nephrectomy, would not consider ideal candidate for invasive procedure at this time. Also received CTA dye load on admission. Discuss with MD, though would favor more conservative approach unless significant troponin elevation or EKG changes.  -- cycle troponins -- consider echo for WMA  2. PAF: hx of the same and has been on Eliquis and amiodarone prior to admission.   3. CAD s/p CABG x4 '10: Underwent cath in 2018 with PCI/DES to svg-OM2, without other graft disease noted.   4. CKD/renal cell ca/-->s/p right nephrectomy: baseline around 1.5-1.7.   For questions or updates, please contact [place] Please consult [email or web address] for contact info under        Signed, Reino Bellis, NP  10/13/2018 4:18 PM   Attending Note:   The patient was seen and examined.  Agree with assessment and plan as noted above.  Changes made to the above note as needed.  Patient seen and independently examined with  Reino Bellis, NP .   We discussed all aspects of the encounter. I agree with the assessment and plan as stated above.  1.   Chest pain / upper abdominal pan :  Pt reports having upper abd pain / chest pain after eating .   Occurs after every meal.   Does not occur after exertion ( although he admits to not doing much ) Relieved with antiacids and SL NTG.  Pain is not similar to his previous angina pain   Will continue to cycle enzymes Anticipate myoview in the am ( assuming troponins dont increase  dramatically   2.  Thrombocytopenia:   Further eval  per IM  3.  CKD :  Creatinine is 1.89.  Received contrast today for CT scan. Will need to be followed.   4.  Hyperglycemia :  Plans per IM     I have spent a total of 40 minutes with patient reviewing hospital  notes , telemetry, EKGs, labs and examining patient as well as establishing an assessment and plan that was discussed with the patient. > 50% of time was spent in direct patient care.    Thayer Headings, Brooke Bonito., MD, [facility] 10/13/2018, 5:21 PM [address]  [place] Pager [phone]

## 2018-10-13 NOTE — H&P (Addendum)
History and Physical    Matthew [name] WTU:[id number] DOB: Sep 04, 1945 DOA: 10/13/2018  PCP: Marda Stalker, PA-C   Patient coming from: Home   Chief Complaint:   HPI: Matthew [name] is a 73 y.o. male with medical history significant of abdominal aortic aneurysm, atrial fibrillation, coronary artery disease status post bypass grafting 2011, PCI 2018, chronic kidney disease, COPD, diabetes mellitus type 2, hypertension, dyslipidemia and renal cell carcinoma status post nephrectomy.  Patient reported new onset of chest pain for the last 3 days, sharp in nature, moderate in intensity, no radiation, no improving factors, triggered by eating.  His physical mobility is significantly limited due to back pain and knee joint arthritis.  He does not have any angina.  Denies any PND orthopnea.  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  ED Course: Patient was found hemodynamically stable, he received antiacids, his troponin I was elevated at 0.06, cardiology was consulted with recommendations to admit for further observation.  Possible stress test in the morning.  Review of Systems:  1. General: No fevers, no chills, no weight gain or weight loss 2. ENT: No runny nose or sore throat, no hearing disturbances 3. Pulmonary: No dyspnea, cough, wheezing, or hemoptysis 4. Cardiovascular: No angina, claudication, lower extremity edema, pnd or orthopnea 5. Gastrointestinal: No nausea or vomiting, no diarrhea or constipation. Positive postpandrial chest pain.  6. Hematology: No easy bruisability or frequent infections 7. Urology: No dysuria, hematuria or increased urinary frequency 8. Dermatology: No rashes. 9. Neurology: No seizures or paresthesias 10. Musculoskeletal: No joint pain or deformities  Past Medical History:  Diagnosis Date   AAA (abdominal aortic aneurysm) ([place])    a. Aortic US 05/2012: 3.9 cm fusiform abdominal aortic aneurysm. F/u recommended 05/2014.     Anemia    Arthritis    DDD   Atrial fibrillation ([place])    a. Post-op CABG 05/2009. b. Re-identified 12/2013.    CAD (coronary artery disease)    a. s/p CABGx4 05/2009. 10/10/16 PCI with DES x1 SVG-->OM2/OM3   Chronic constipation    CKD (chronic kidney disease), stage III (HCC)    Cocaine abuse in remission [facility])    Colon polyps    Depression    Diabetes mellitus    Empyema lung ([place])    a. PNA 02/2011 c/b empyema requiring chest tube, drainage of pleural effusion and decortication.   Enlarged prostate    Frequent falls    GERD (gastroesophageal reflux disease)    Headache    Hepatic steatosis    Hyperlipidemia    Hypertension    Hypothyroidism    Iron deficiency anemia    LAFB (left anterior fascicular block)    Lung cancer (HCC)    a. Tx with interleukin in 2002 per patient.   Myocardial infarction ([place])    10/08/16   Nodule of left lung    a. Cavitary nodules 02/2011, f/u recommended.   Obesity    Paroxysmal supraventricular tachycardia ([place])    a. While inpatient 02/2011 sick with empyema - wide appearace due to RBBB.   Pneumonia    Post concussion syndrome    RBBB    Renal cell carcinoma    a. s/p R nephrectomy 2002. Reported pulmonary mets per note at that time.   Scrotal abscess    a. 2012 2/2 Streptococcus group C, status post incision and drainage.   Sleep apnea    wears CPAP   Ulcerative proctitis ([place])    a. Remote  dx dating back to 1979.   Vitamin D deficiency    Wears dentures     Past Surgical History:  Procedure Laterality Date   Bronchoscopy, right video-assisted thoracoscopy, minithoracotomy, drainage of pleural effusion, and decortication.  02/26/2011   [facility]   CABG x 4  05/13/2009   [facility]   CARDIAC SURGERY     COLONOSCOPY W/ BIOPSIES AND POLYPECTOMY     CORONARY STENT INTERVENTION N/A 10/10/2016   Procedure: Coronary Stent Intervention;  Surgeon: Troy Sine, MD;  Location: [facility];   Service: Cardiovascular;  Laterality: N/A;   HEMORRHOID SURGERY     HERNIA REPAIR     Incision, drainage and debridement of perineum and  scrotum     Irrigation and debridement of scrotal wound and closure of scrotal wound.     LEFT HEART CATH AND CORS/GRAFTS ANGIOGRAPHY N/A 10/10/2016   Procedure: Left Heart Cath and Cors/Grafts Angiography;  Surgeon: Troy Sine, MD;  Location: [place] [facility];  Service: Cardiovascular;  Laterality: N/A;   MULTIPLE TOOTH EXTRACTIONS     RADIOLOGY WITH ANESTHESIA N/A 05/09/2017   Procedure: MRI WITH ANESTHESIA   BRAIN WITHOUT CONTRAST;  Surgeon: Radiologist, Medication, MD;  Location: [facility];  Service: Radiology;  Laterality: N/A;   Right radical nephrectomy.     TOOTH EXTRACTION       reports that he has never smoked. He has never used smokeless tobacco. He reports current drug use. Drug: Cocaine. He reports that he does not drink alcohol.  Allergies  Allergen Reactions   Amiodarone Other (See Comments)    Caused lung and liver toxicity   Tetanus Toxoids Hives    Family History  Problem Relation Age of Onset   Heart disease Father    Alzheimer's disease Mother    Pneumonia Other    Colon cancer Neg Hx    Esophageal cancer Neg Hx    Stomach cancer Neg Hx    Rectal cancer Neg Hx      Prior to Admission medications   Medication Sig Start Date End Date Taking? Authorizing Provider  ALPRAZolam (XANAX) 0.25 MG tablet Take 1 tablet (0.25 mg total) by mouth at bedtime as needed for anxiety. Patient taking differently: Take 0.25 mg by mouth at bedtime as needed for anxiety or sleep.  08/22/18  Yes Sater, Nanine Means, MD  apixaban (ELIQUIS) 5 MG TABS tablet Take 5 mg by mouth 2 (two) times daily.   Yes [provider]  aspirin 81 MG chewable tablet Chew 81 mg by mouth daily.   Yes [provider]  buPROPion (WELLBUTRIN XL) 150 MG 24 hr tablet Take 150 mg by mouth daily.   Yes [provider]  capsaicin  (ZOSTRIX) 0.025 % cream Apply 1 application topically 2 (two) times daily as needed (for pain).    Yes [provider]  Cholecalciferol (D3-1000) 25 MCG (1000 UT) tablet Take 1,000 Units by mouth at bedtime.   Yes [provider]  clotrimazole (LOTRIMIN) 1 % cream Apply 1 application topically 2 (two) times daily as needed (for rash).    Yes [provider]  dronedarone (MULTAQ) 400 MG tablet Take 400 mg by mouth 2 (two) times daily with a meal.   Yes [provider]  ezetimibe (ZETIA) 10 MG tablet Take 5 mg by mouth at bedtime.   Yes [provider]  fenofibrate 54 MG tablet Take 54 mg by mouth at bedtime.   Yes [provider]  ferrous sulfate 325 (65 FE) MG tablet Take 325 mg by mouth 3 (three) times a week. Tuesday, Wednesday and Friday   Yes [provider]  finasteride (PROSCAR) 5 MG tablet Take 5 mg by mouth at bedtime.    Yes [provider]  gabapentin (NEURONTIN) 300 MG capsule Take 300 mg by mouth 2 (two) times daily.    Yes [provider]  glucose 4 GM chewable tablet Chew 1 tablet by mouth as needed for low blood sugar.   Yes [provider]  hydrOXYzine (ATARAX/VISTARIL) 10 MG tablet Take 10 mg by mouth daily as needed for itching. 08/07/18  Yes [provider]  ibuprofen (ADVIL) 200 MG tablet Take 200 mg by mouth every 8 (eight) hours as needed (for gout pain).   Yes [provider]  insulin aspart (NOVOLOG FLEXPEN) 100 UNIT/ML FlexPen Inject 10-40 Units into the skin 2 (two) times daily after a meal. Lunch and Dinner(evening meal)   Yes [provider]  Insulin Detemir (LEVEMIR FLEXPEN) 100 UNIT/ML Pen Inject 50 Units into the skin 2 (two) times daily.    Yes [provider]  isosorbide mononitrate (IMDUR) 60 MG 24 hr tablet Take 1 tablet (60 mg total) by mouth daily. Hold if blood pressure <100 07/30/17  Yes Annita Brod, MD  ketoconazole (NIZORAL) 2 %  cream Apply 1 application topically daily as needed for irritation (or rashes).    Yes [provider]  levothyroxine (SYNTHROID) 175 MCG tablet Take 175 mcg by mouth daily before breakfast.   Yes [provider]  LIDOCAINE EX Apply 1 application topically 2 (two) times daily as needed (for pain).    Yes [provider]  linaclotide (LINZESS) 290 MCG CAPS capsule Take 290 mcg by mouth daily as needed (for constipation).   Yes [provider]  Magnesium Oxide 420 MG TABS Take 420 mg by mouth 2 (two) times a day.    Yes [provider]  MENTHOL-METHYL SALICYLATE EX Apply 1 application topically as needed (pain).   Yes [provider]  mesalamine (CANASA) 1000 MG suppository Place 1,000 mg rectally at bedtime as needed (ulcerative colitis flares).    Yes [provider]  mesalamine (LIALDA) 1.2 g EC tablet Take 1.2 g by mouth daily as needed (ulcerative colitis flares).   Yes [provider]  methylPREDNISolone (MEDROL) 4 MG tablet Take 4 mg by mouth 2 (two) times daily as needed (gout flares).    Yes [provider]  metoprolol succinate (TOPROL-XL) 25 MG 24 hr tablet Take 12.5 mg by mouth daily.   Yes [provider]  neomycin-bacitracin-polymyxin (NEOSPORIN) ointment Apply 1 application topically as needed for wound care.   Yes [provider]  nitroGLYCERIN (NITROSTAT) 0.4 MG SL tablet Place 0.4 mg under the tongue every 5 (five) minutes x 3 doses as needed for chest pain.    Yes [provider]  nortriptyline (PAMELOR) 25 MG capsule Take 25 mg by mouth at bedtime as needed for headache.   Yes [provider]  nystatin cream (MYCOSTATIN) Apply 1 application topically daily as needed for dry skin (or rash).    Yes [provider]  Omega-3 Fatty Acids (FISH OIL) 1000 MG CAPS Take 3,000 mg by mouth 2 (two) times daily.    Yes [provider]  OVER THE COUNTER MEDICATION  "[place]" chewable for acid reflux= Chew 1-2 tablets by mouth after meals or at bedtime as needed for indigestion  Yes [provider]  oxyCODONE-acetaminophen (PERCOCET) 10-325 MG per tablet Take 1 tablet by mouth every 6 (six) hours as needed for pain.    Yes [provider]  rosuvastatin (CRESTOR) 40 MG tablet Take 20 mg by mouth at bedtime.    Yes [provider]  senna (SENOKOT) 8.6 MG TABS tablet Take 1-2 tablets by mouth See admin instructions. Take 2 tablets by mouth in the morning and 1 tablet at bedtime   Yes [provider]  silver sulfADIAZINE (SILVADENE) 1 % cream Apply 1 application topically daily as needed (skin care).    Yes [provider]  terbinafine (LAMISIL) 1 % cream Apply 1 application topically daily as needed (skin care).    Yes [provider]  traZODone (DESYREL) 50 MG tablet Take 1 tablet (50 mg total) by mouth at bedtime. 02/21/18  Yes Sater, Nanine Means, MD  triamcinolone cream (KENALOG) 0.1 % Apply 1 application topically daily as needed (rash).   Yes [provider]  urea 10 % lotion Apply 1 application topically as needed for dry skin.   Yes [provider]  amiodarone (PACERONE) 200 MG tablet Take 0.5 tablets (100 mg total) by mouth 2 (two) times daily. Patient not taking: Reported on 10/13/2018 07/29/17   Annita Brod, MD  clopidogrel (PLAVIX) 75 MG tablet Take 1 tablet (75 mg total) by mouth daily with breakfast. Patient not taking: Reported on 10/13/2018 10/12/16   Reino Bellis B, NP  erythromycin ophthalmic ointment Place a 1/2 inch ribbon of ointment into the right lower eyelid 4 times per day for 7 days. Patient not taking: Reported on 10/13/2018 04/19/18   Long, Wonda Olds, MD  mupirocin ointment (BACTROBAN) 2 % Place 1 application into the nose daily as needed (for [facility]).     [provider]  RABEprazole (ACIPHEX) 20 MG tablet Take 40 mg by mouth 2 (two) times daily before a meal.      [provider]    Physical Exam: Vitals:   10/13/18 1445 10/13/18 1500 10/13/18 1715 10/13/18 1745  BP: 115/74 107/76 (!) 135/98 108/68  Pulse: 64 61 68 (!) 54  Resp: 15 10 10 10   Temp:      TempSrc:      SpO2: 92% 96% 96% 99%  Weight:      Height:        Vitals:   10/13/18 1445 10/13/18 1500 10/13/18 1715 10/13/18 1745  BP: 115/74 107/76 (!) 135/98 108/68  Pulse: 64 61 68 (!) 54  Resp: 15 10 10 10   Temp:      TempSrc:      SpO2: 92% 96% 96% 99%  Weight:      Height:       General:  Deconditioned  Neurology: Awake and alert, non focal Head and Neck. Head normocephalic. Neck supple with no adenopathy or thyromegaly.   E ENT: mild pallor, no icterus, oral mucosa dry Cardiovascular: No JVD. S1-S2 present, rhythmic, no S3 or S4 gallops, rubs, or murmurs. No lower extremity edema. Pulmonary: positive breath sounds bilaterally, adequate air movement, no wheezing, rhonchi or rales. Gastrointestinal. Abdomen with no organomegaly, non tender, no rebound or guarding Skin. No rashes Musculoskeletal: no joint deformities    Labs on Admission: I have personally reviewed following labs and imaging studies  CBC: Recent Labs  Lab 10/13/18 1343  WBC 7.3  NEUTROABS 4.3  HGB 13.7  HCT 43.0  MCV 96.4  PLT 82*   Basic Metabolic Panel:  Recent Labs  Lab 10/13/18 1343  NA 137  K 4.7  CL 104  CO2 22  GLUCOSE 257*  BUN 16  CREATININE 1.89*  CALCIUM 8.9   GFR: Estimated Creatinine Clearance: 45.4 mL/min (A) (by C-G formula based on SCr of 1.89 mg/dL (H)). Liver Function Tests: Recent Labs  Lab 10/13/18 1343  AST 26  ALT 19  ALKPHOS 29*  BILITOT 1.1  PROT 6.2*  ALBUMIN 3.1*   Recent Labs  Lab 10/13/18 1343  LIPASE 17   No results for input(s): AMMONIA in the last 168 hours. Coagulation Profile: No results for input(s): INR, PROTIME in the last 168 hours. Cardiac Enzymes: Recent Labs  Lab 10/13/18 1343  TROPONINI 0.06*   BNP (last 3  results) No results for input(s): PROBNP in the last 8760 hours. HbA1C: No results for input(s): HGBA1C in the last 72 hours. CBG: No results for input(s): GLUCAP in the last 168 hours. Lipid Profile: No results for input(s): CHOL, HDL, LDLCALC, TRIG, CHOLHDL, LDLDIRECT in the last 72 hours. Thyroid Function Tests: No results for input(s): TSH, T4TOTAL, FREET4, T3FREE, THYROIDAB in the last 72 hours. Anemia Panel: No results for input(s): VITAMINB12, FOLATE, FERRITIN, TIBC, IRON, RETICCTPCT in the last 72 hours. Urine analysis:    Component Value Date/Time   COLORURINE YELLOW 02/27/2017 1242   APPEARANCEUR CLEAR 02/27/2017 1242   LABSPEC 1.007 02/27/2017 1242   PHURINE 5.0 02/27/2017 1242   GLUCOSEU >=500 (A) 02/27/2017 1242   HGBUR MODERATE (A) 02/27/2017 1242   BILIRUBINUR NEGATIVE 02/27/2017 1242   KETONESUR NEGATIVE 02/27/2017 1242   PROTEINUR NEGATIVE 02/27/2017 1242   UROBILINOGEN 1.0 02/16/2011 0846   NITRITE NEGATIVE 02/27/2017 1242   LEUKOCYTESUR NEGATIVE 02/27/2017 1242    Radiological Exams on Admission: Dg Chest 2 View  Result Date: 10/13/2018 CLINICAL DATA:  Left-sided chest pain for the past 2 days. EXAM: CHEST - 2 VIEW COMPARISON:  Chest x-ray dated July 27, 2018. FINDINGS: Stable cardiomediastinal silhouette status post CABG. Normal pulmonary vascularity. Progressive linear scarring/atelectasis in the right lower lobe. No focal consolidation, pleural effusion, or pneumothorax. No acute osseous abnormality. Unchanged fractures of several sternal wires. IMPRESSION: No active cardiopulmonary disease. Electronically Signed   By: Titus Dubin M.D.   On: 10/13/2018 14:21   Ct Angio Chest/abd/pel For Dissection W And/or Wo Contrast  Result Date: 10/13/2018 CLINICAL DATA:  Chest and abdominal pain. History of abdominal aortic aneurysm. EXAM: CT ANGIOGRAPHY CHEST, ABDOMEN AND PELVIS TECHNIQUE: Multidetector CT imaging through the chest, abdomen and pelvis was performed  using the standard protocol during bolus administration of intravenous contrast. Multiplanar reconstructed images and MIPs were obtained and reviewed to evaluate the vascular anatomy. CONTRAST:  53m OMNIPAQUE IOHEXOL 350 MG/ML SOLN COMPARISON:  None. FINDINGS: CTA CHEST FINDINGS Cardiovascular: The thoracic aorta is normal in caliber and demonstrates no evidence of aneurysmal disease or dissection. Mild aortic atherosclerosis present. Proximal great vessels show no significant obstructive disease. The right subclavian artery is mildly dilated in caliber reaching maximal diameter of 13 mm. Evidence of prior CABG. The heart size is normal. No pericardial fluid identified. Central pulmonary arteries are normal in caliber. Mediastinum/Nodes: No enlarged mediastinal, hilar, or axillary lymph nodes. Thyroid gland, trachea, and esophagus demonstrate no significant findings. Lungs/Pleura: Bronchial thickening and mild bronchiectasis present in the posterior right lower lobe with some distal mucous plugging. Some component of acute bronchitis cannot be excluded. There is no evidence of pulmonary edema, pneumothorax, nodule or pleural fluid. Musculoskeletal: No chest wall abnormality. No  acute or significant osseous findings. Review of the MIP images confirms the above findings. CTA ABDOMEN AND PELVIS FINDINGS VASCULAR Aorta: There is irregular predominately noncalcified plaque throughout the abdominal aorta. At the level of the inferior mesenteric artery origin, the aorta is minimally dilated measuring approximately 3.0 x 3.2 cm. No evidence of aortic dissection or rupture. Celiac: Mild irregular plaque in the proximal celiac trunk without significant stenosis. Distal branch vessels are normally patent. SMA: Normally patent. Renals: Single left renal artery is normally patent. The right renal artery has been ligated after prior nephrectomy. IMA: Normally patent. Inflow: Origin of the right common iliac artery demonstrates  50-60% stenosis. Both internal iliac arteries demonstrate diffuse proximal disease. No other significant iliac disease. Common femoral arteries and femoral bifurcations are normally patent. Review of the MIP images confirms the above findings. NON-VASCULAR Hepatobiliary: No focal liver abnormality is seen. No gallstones, gallbladder wall thickening, or biliary dilatation. Pancreas: Unremarkable. No pancreatic ductal dilatation or surrounding inflammatory changes. Spleen: Normal in size without focal abnormality. Adrenals/Urinary Tract: Status post right nephrectomy. Left kidney demonstrates no mass or hydronephrosis. No adrenal masses identified. Stomach/Bowel: Small a moderate hiatal hernia. No evidence of bowel obstruction, ileus, lesion or inflammation. No free air identified. Lymphatic: No enlarged lymph nodes identified. Reproductive: Prostate is unremarkable. Other: Bilateral small inguinal hernias contain fat. No ascites or focal abscess identified. Musculoskeletal: Degenerative disc disease at L4-5. Review of the MIP images confirms the above findings. IMPRESSION: 1. 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 2. No evidence of thoracic aortic aneurysmal disease or dissection. 3. 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 No evidence of abdominal aortic dissection or rupture. 4. 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 5. Status post right nephrectomy. 6. Moderate hiatal hernia. 7. Bilateral small inguinal hernias containing fat. Electronically Signed   By: Aletta Edouard M.D.   On: 10/13/2018 15:38    EKG: Independently reviewed.  81 bpm, left axis deviation, sinus rhythm with a right bundle branch block, Q waves in V3, loss of R waves in V4 to V6 (no acute changes compared to February 2020)  Assessment/Plan Active Problems:   Hypertension   CAD (coronary artery disease)   Renal cell carcinoma (HCC)    Hypothyroidism   Chest pain   PAF (paroxysmal atrial fibrillation) (HCC)   CKD (chronic kidney disease), stage III (HCC)   AAA (abdominal aortic aneurysm) ([place])  73 year old male who presents with atypical chest pain, described mainly triggered by eating, his physical activity is limited due to back pain and knee arthritis.  No frank anginal symptoms.  He does have significant history for coronary disease.  On his initial physical examination blood pressure 106/64, heart rate 57, respiratory rate 15, oxygen saturation 98%, he had dry mucous membranes, lungs clear to auscultation, heart S1-S2 present and rhythmic, no S3 or S4 gallop, abdomen soft, no lower extremity edema.  Sodium 137, potassium 4.7, chloride 104, bicarb 22, glucose 257, BUN 16, creatinine 1.89, troponin 0.06, white count 7.3, hemoglobin 13.7, hematocrit 43.0, platelets 82.  His a chest x-ray is negative for infiltrates, chronic right base scar.  CT chest with no Arctic aneurysm.   Patient will be admitted to the hospital working diagnosis of atypical chest pain rule out acute coronary syndrome.  1.  Atypical chest pain, rule out acute coronary syndrome.  Patient will be admitted to the medical ward, he will be placed on a remote  telemetry monitor, continue to trend cardiac enzymes, pain control with morphine.  Continue blood pressure control with metoprolol and isosorbide.  Antiplatelet therapy with aspirin, anticoagulation with apixaban.  2.  Dyspepsia.  Will continue antiacid therapy with pantoprazole twice daily, will add sucralfate, advance diet as tolerated.  3.  Paroxysmal atrial fibrillation.  Continue rate control with dronedarone, anticoagulation with apixaban.  4.  Type 2 diabetes mellitus.  Admision glucose is 257, will add insulin sliding scale for glucose coverage and monitoring.  Continue basal insulin with 25 units twice daily of Levemir.  5.  Dyslipidemia.  Continue rosuvastatin, ezetimibe, and fenofibrate.  6.   Hypothyroidism.  Continue levothyroxine.  7.  Ulcerative colitis.  Continue therapy with mesalamine.  8. CKD stage 3b.  Calculated GFR 36 with a base creatinine 1.8, continue close follow-up of kidney function, avoid hypotension nephrotoxic agents.  DVT prophylaxis: apixaban.   Code Status: full  Family Communication: no family at the bedside   Disposition Plan: telemetry  Consults called: cardiology  Admission status:  Observation    Lorann Tani Gerome Apley MD [facility]   10/13/2018, 5:50 PM

## 2018-10-13 NOTE — ED Notes (Signed)
Hospitalist at the bedside 

## 2018-10-14 DIAGNOSIS — N400 Enlarged prostate without lower urinary tract symptoms: Secondary | ICD-10-CM | POA: Diagnosis present

## 2018-10-14 DIAGNOSIS — R072 Precordial pain: Secondary | ICD-10-CM | POA: Diagnosis present

## 2018-10-14 DIAGNOSIS — E039 Hypothyroidism, unspecified: Secondary | ICD-10-CM | POA: Diagnosis not present

## 2018-10-14 DIAGNOSIS — Z1159 Encounter for screening for other viral diseases: Secondary | ICD-10-CM | POA: Diagnosis not present

## 2018-10-14 DIAGNOSIS — I209 Angina pectoris, unspecified: Secondary | ICD-10-CM | POA: Diagnosis not present

## 2018-10-14 DIAGNOSIS — Z7901 Long term (current) use of anticoagulants: Secondary | ICD-10-CM | POA: Diagnosis not present

## 2018-10-14 DIAGNOSIS — Z955 Presence of coronary angioplasty implant and graft: Secondary | ICD-10-CM | POA: Diagnosis not present

## 2018-10-14 DIAGNOSIS — I2510 Atherosclerotic heart disease of native coronary artery without angina pectoris: Secondary | ICD-10-CM | POA: Diagnosis not present

## 2018-10-14 DIAGNOSIS — K5120 Ulcerative (chronic) proctitis without complications: Secondary | ICD-10-CM | POA: Diagnosis present

## 2018-10-14 DIAGNOSIS — N183 Chronic kidney disease, stage 3 (moderate): Secondary | ICD-10-CM

## 2018-10-14 DIAGNOSIS — Z905 Acquired absence of kidney: Secondary | ICD-10-CM | POA: Diagnosis not present

## 2018-10-14 DIAGNOSIS — Z85118 Personal history of other malignant neoplasm of bronchus and lung: Secondary | ICD-10-CM | POA: Diagnosis not present

## 2018-10-14 DIAGNOSIS — Z951 Presence of aortocoronary bypass graft: Secondary | ICD-10-CM | POA: Diagnosis not present

## 2018-10-14 DIAGNOSIS — I480 Paroxysmal atrial fibrillation: Secondary | ICD-10-CM | POA: Diagnosis not present

## 2018-10-14 DIAGNOSIS — I2583 Coronary atherosclerosis due to lipid rich plaque: Secondary | ICD-10-CM

## 2018-10-14 DIAGNOSIS — Z85528 Personal history of other malignant neoplasm of kidney: Secondary | ICD-10-CM | POA: Diagnosis not present

## 2018-10-14 DIAGNOSIS — E1165 Type 2 diabetes mellitus with hyperglycemia: Secondary | ICD-10-CM | POA: Diagnosis present

## 2018-10-14 DIAGNOSIS — K760 Fatty (change of) liver, not elsewhere classified: Secondary | ICD-10-CM | POA: Diagnosis present

## 2018-10-14 DIAGNOSIS — N179 Acute kidney failure, unspecified: Secondary | ICD-10-CM | POA: Diagnosis present

## 2018-10-14 DIAGNOSIS — R7989 Other specified abnormal findings of blood chemistry: Secondary | ICD-10-CM | POA: Diagnosis not present

## 2018-10-14 DIAGNOSIS — I25810 Atherosclerosis of coronary artery bypass graft(s) without angina pectoris: Secondary | ICD-10-CM | POA: Diagnosis not present

## 2018-10-14 DIAGNOSIS — Z7982 Long term (current) use of aspirin: Secondary | ICD-10-CM | POA: Diagnosis not present

## 2018-10-14 DIAGNOSIS — E785 Hyperlipidemia, unspecified: Secondary | ICD-10-CM | POA: Diagnosis present

## 2018-10-14 DIAGNOSIS — I714 Abdominal aortic aneurysm, without rupture: Secondary | ICD-10-CM | POA: Diagnosis not present

## 2018-10-14 DIAGNOSIS — E1122 Type 2 diabetes mellitus with diabetic chronic kidney disease: Secondary | ICD-10-CM | POA: Diagnosis present

## 2018-10-14 DIAGNOSIS — Z8249 Family history of ischemic heart disease and other diseases of the circulatory system: Secondary | ICD-10-CM | POA: Diagnosis not present

## 2018-10-14 DIAGNOSIS — I252 Old myocardial infarction: Secondary | ICD-10-CM | POA: Diagnosis not present

## 2018-10-14 DIAGNOSIS — I129 Hypertensive chronic kidney disease with stage 1 through stage 4 chronic kidney disease, or unspecified chronic kidney disease: Secondary | ICD-10-CM | POA: Diagnosis present

## 2018-10-14 DIAGNOSIS — F1411 Cocaine abuse, in remission: Secondary | ICD-10-CM | POA: Diagnosis present

## 2018-10-14 DIAGNOSIS — I25118 Atherosclerotic heart disease of native coronary artery with other forms of angina pectoris: Secondary | ICD-10-CM | POA: Diagnosis not present

## 2018-10-14 DIAGNOSIS — I214 Non-ST elevation (NSTEMI) myocardial infarction: Secondary | ICD-10-CM | POA: Diagnosis not present

## 2018-10-14 DIAGNOSIS — K219 Gastro-esophageal reflux disease without esophagitis: Secondary | ICD-10-CM | POA: Diagnosis present

## 2018-10-14 LAB — GLUCOSE, CAPILLARY
Glucose-Capillary: 197 mg/dL — ABNORMAL HIGH (ref 70–99)
Glucose-Capillary: 240 mg/dL — ABNORMAL HIGH (ref 70–99)
Glucose-Capillary: 286 mg/dL — ABNORMAL HIGH (ref 70–99)
Glucose-Capillary: 301 mg/dL — ABNORMAL HIGH (ref 70–99)

## 2018-10-14 LAB — BASIC METABOLIC PANEL
Anion gap: 14 (ref 5–15)
BUN: 14 mg/dL (ref 8–23)
CO2: 23 mmol/L (ref 22–32)
Calcium: 9.3 mg/dL (ref 8.9–10.3)
Chloride: 102 mmol/L (ref 98–111)
Creatinine, Ser: 1.92 mg/dL — ABNORMAL HIGH (ref 0.61–1.24)
GFR calc Af Amer: 39 mL/min — ABNORMAL LOW (ref >60)
GFR calc non Af Amer: 34 mL/min — ABNORMAL LOW (ref >60)
Glucose, Bld: 262 mg/dL — ABNORMAL HIGH (ref 70–99)
Potassium: 4.1 mmol/L (ref 3.5–5.1)
Sodium: 139 mmol/L (ref 135–145)

## 2018-10-14 LAB — CBC
HCT: 40.3 % (ref 39.0–52.0)
Hemoglobin: 13.3 g/dL (ref 13.0–17.0)
MCH: 30.8 pg (ref 26.0–34.0)
MCHC: 33 g/dL (ref 30.0–36.0)
MCV: 93.3 fL (ref 80.0–100.0)
Platelets: 214 10*3/uL (ref 150–400)
RBC: 4.32 MIL/uL (ref 4.22–5.81)
RDW: 13.5 % (ref 11.5–15.5)
WBC: 8 10*3/uL (ref 4.0–10.5)
nRBC: 0 % (ref 0.0–0.2)

## 2018-10-14 LAB — APTT
aPTT: 40 seconds — ABNORMAL HIGH (ref 24–36)
aPTT: 135 seconds — ABNORMAL HIGH (ref 24–36)
aPTT: 108 seconds — ABNORMAL HIGH (ref 24–36)

## 2018-10-14 LAB — TROPONIN I: Troponin I: 0.19 ng/mL (ref <0.03)

## 2018-10-14 MED ORDER — SODIUM CHLORIDE 0.9% FLUSH
3.0000 mL | Freq: Two times a day (BID) | INTRAVENOUS | Status: DC
  Administered 2018-10-14 – 2018-10-15 (×2): 3 mL via INTRAVENOUS

## 2018-10-14 MED ORDER — HEPARIN BOLUS VIA INFUSION
4000.0000 [IU] | Freq: Once | INTRAVENOUS | Status: AC
  Administered 2018-10-14: 4000 [IU] via INTRAVENOUS
  Filled 2018-10-14: qty 4000

## 2018-10-14 MED ORDER — SODIUM CHLORIDE 0.9 % IV SOLN: 250.0000 mL | INTRAVENOUS | Status: DC | PRN

## 2018-10-14 MED ORDER — SODIUM CHLORIDE 0.9 % IV SOLN
INTRAVENOUS | Status: DC
  Administered 2018-10-14: 19:00:00 via INTRAVENOUS

## 2018-10-14 MED ORDER — HEPARIN (PORCINE) 25000 UT/250ML-% IV SOLN
1200.0000 [IU]/h | INTRAVENOUS | Status: DC
  Administered 2018-10-14 (×2): 1250 [IU]/h via INTRAVENOUS
  Administered 2018-10-15: 03:00:00 1200 [IU]/h via INTRAVENOUS
  Filled 2018-10-14 (×2): qty 250

## 2018-10-14 MED ORDER — SODIUM CHLORIDE 0.9% FLUSH: 3.0000 mL | INTRAVENOUS | Status: DC | PRN

## 2018-10-14 NOTE — Progress Notes (Signed)
PROGRESS NOTE    Matthew [name]  QJF:[id number] DOB: 08-11-1945 DOA: 10/13/2018 PCP: Marda Stalker, PA-C   Brief Narrative:  Per HPI: Matthew [name] is a 73 y.o. male with medical history significant of abdominal aortic aneurysm, atrial fibrillation, coronary artery disease status post bypass grafting 2011, PCI 2018, chronic kidney disease, COPD, diabetes mellitus type 2, hypertension, dyslipidemia and renal cell carcinoma status post nephrectomy.  Patient reported new onset of chest pain for the last 3 days, sharp in nature, moderate in intensity, no radiation, no improving factors, triggered by eating.  His physical mobility is significantly limited due to back pain and knee joint arthritis.  He does not have any angina.  Denies any PND orthopnea.  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  ED Course: Patient was found hemodynamically stable, he received antiacids, his troponin I was elevated at 0.06, cardiology was consulted with recommendations to admit for further observation.  Possible stress test in the morning  Brief Hospital Course: Pt was seen by cardiology 2/2 concerns for mild bump in trop, planned heart cath 5/6  Assessment & Plan:   Active Problems:   Hypertension   CAD (coronary artery disease)   Renal cell carcinoma (HCC)   Hypothyroidism   Chest pain   PAF (paroxysmal atrial fibrillation) (HCC)   CKD (chronic kidney disease), stage III ([place])   AAA (abdominal aortic aneurysm) ([place])   Ischemic chest pain ([place])   .  Atypical chest pain, rule out acute coronary syndrome. Pt was admitted for obs but converted to inpt given elev trop and planned heart cath in AM, C/W pain control with morphine.  Continue blood pressure control with metoprolol and isosorbide.  Antiplatelet therapy with aspirin, anticoagulation with apixaban.  2.  Dyspepsia.  Will continue antiacid therapy with pantoprazole twice daily, will add sucralfate, advance  diet as tolerated.  3.  Paroxysmal atrial fibrillation.  Continue rate control with dronedarone, anticoagulation with apixaban.  4.  Type 2 diabetes mellitus.  Admision glucose is 257, C/Winsulin sliding scale for glucose coverage and monitoring.  Continue basal insulin with 25 units twice daily of Levemir.  5.  Dyslipidemia.  Continue rosuvastatin, ezetimibe, and fenofibrate.  6.  Hypothyroidism.  Continue levothyroxine.  7.  Ulcerative colitis.  Continue therapy with mesalamine.  8. CKD stage 3b.  Calculated GFR 36 with a base creatinine 1.8, continue close follow-up of kidney function, avoid hypotension nephrotoxic agents.   DVT prophylaxis: [id number]  Code Status: Full    Code Status Orders  (From admission, onward)         Start     Ordered   10/13/18 1912  Full code  Continuous     10/13/18 1911        Code Status History    Date Active Date Inactive Code Status Order ID Comments User Context   07/29/2017 0318 07/29/2017 1907 Full Code [id number]  Rise Patience, MD ED   10/08/2016 2350 10/11/2016 1705 Full Code [id number]  Isaiah Serge, NP Inpatient   01/26/2016 2336 01/27/2016 1904 Full Code [id number]  Lamar Sprinkles, MD Inpatient   12/20/2013 1125 12/22/2013 1640 Full Code [id number]  Robbie Lis, MD Inpatient    Advance Directive Documentation     Most Recent Value  Type of Advance Directive  Healthcare Power of [place], Living will [Arlene C. Alroy Dust -- wife]  Pre-existing out of facility DNR order (yellow form or pink MOST form)  --  "MOST" Form in Place?  --  Family Communication: SPOKE WITH WIFE Disposition Plan:   Patient will remain inpatient because of elevated troponins with coronary artery disease.  He will undergo heart catheterization tomorrow for further eval and treatment. Consults called: None Admission status: Inpatient   Consultants:   CARDIOLOGY  Procedures:  Dg Chest 2 View  Result Date: 10/13/2018 CLINICAL DATA:   Left-sided chest pain for the past 2 days. EXAM: CHEST - 2 VIEW COMPARISON:  Chest x-ray dated July 27, 2018. FINDINGS: Stable cardiomediastinal silhouette status post CABG. Normal pulmonary vascularity. Progressive linear scarring/atelectasis in the right lower lobe. No focal consolidation, pleural effusion, or pneumothorax. No acute osseous abnormality. Unchanged fractures of several sternal wires. IMPRESSION: No active cardiopulmonary disease. Electronically Signed   By: Titus Dubin M.D.   On: 10/13/2018 14:21   Ct Angio Chest/abd/pel For Dissection W And/or Wo Contrast  Result Date: 10/13/2018 CLINICAL DATA:  Chest and abdominal pain. History of abdominal aortic aneurysm. EXAM: CT ANGIOGRAPHY CHEST, ABDOMEN AND PELVIS TECHNIQUE: Multidetector CT imaging through the chest, abdomen and pelvis was performed using the standard protocol during bolus administration of intravenous contrast. Multiplanar reconstructed images and MIPs were obtained and reviewed to evaluate the vascular anatomy. CONTRAST:  63m OMNIPAQUE IOHEXOL 350 MG/ML SOLN COMPARISON:  None. FINDINGS: CTA CHEST FINDINGS Cardiovascular: The thoracic aorta is normal in caliber and demonstrates no evidence of aneurysmal disease or dissection. Mild aortic atherosclerosis present. Proximal great vessels show no significant obstructive disease. The right subclavian artery is mildly dilated in caliber reaching maximal diameter of 13 mm. Evidence of prior CABG. The heart size is normal. No pericardial fluid identified. Central pulmonary arteries are normal in caliber. Mediastinum/Nodes: No enlarged mediastinal, hilar, or axillary lymph nodes. Thyroid gland, trachea, and esophagus demonstrate no significant findings. Lungs/Pleura: Bronchial thickening and mild bronchiectasis present in the posterior right lower lobe with some distal mucous plugging. Some component of acute bronchitis cannot be excluded. There is no evidence of pulmonary edema,  pneumothorax, nodule or pleural fluid. Musculoskeletal: No chest wall abnormality. No acute or significant osseous findings. Review of the MIP images confirms the above findings. CTA ABDOMEN AND PELVIS FINDINGS VASCULAR Aorta: There is irregular predominately noncalcified plaque throughout the abdominal aorta. At the level of the inferior mesenteric artery origin, the aorta is minimally dilated measuring approximately 3.0 x 3.2 cm. No evidence of aortic dissection or rupture. Celiac: Mild irregular plaque in the proximal celiac trunk without significant stenosis. Distal branch vessels are normally patent. SMA: Normally patent. Renals: Single left renal artery is normally patent. The right renal artery has been ligated after prior nephrectomy. IMA: Normally patent. Inflow: Origin of the right common iliac artery demonstrates 50-60% stenosis. Both internal iliac arteries demonstrate diffuse proximal disease. No other significant iliac disease. Common femoral arteries and femoral bifurcations are normally patent. Review of the MIP images confirms the above findings. NON-VASCULAR Hepatobiliary: No focal liver abnormality is seen. No gallstones, gallbladder wall thickening, or biliary dilatation. Pancreas: Unremarkable. No pancreatic ductal dilatation or surrounding inflammatory changes. Spleen: Normal in size without focal abnormality. Adrenals/Urinary Tract: Status post right nephrectomy. Left kidney demonstrates no mass or hydronephrosis. No adrenal masses identified. Stomach/Bowel: Small a moderate hiatal hernia. No evidence of bowel obstruction, ileus, lesion or inflammation. No free air identified. Lymphatic: No enlarged lymph nodes identified. Reproductive: Prostate is unremarkable. Other: Bilateral small inguinal hernias contain fat. No ascites or focal abscess identified. Musculoskeletal: Degenerative disc disease at L4-5. Review of the MIP images confirms the above findings. IMPRESSION: 1. 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 2. No evidence of thoracic aortic aneurysmal disease or dissection. 3. 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 No evidence of abdominal aortic dissection or rupture. 4. 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 5. Status post right nephrectomy. 6. Moderate hiatal hernia. 7. Bilateral small inguinal hernias containing fat. Electronically Signed   By: Aletta Edouard M.D.   On: 10/13/2018 15:38     Antimicrobials:   NONE   Subjective: Patient doing well overnight no complications.  Reports no abdominal pain no chest pain this morning. Patient was seen by cardiology with plans for heart cath tomorrow  Objective: Vitals:   10/13/18 1908 10/13/18 2100 10/14/18 0359 10/14/18 1433  BP: (!) 125/58 107/88 94/71 (!) 71/39  Pulse: (!) 54 (!) 56 70 (!) 57  Resp: 15 17 18 18   Temp: 97.8 F (36.6 C) 97.7 F (36.5 C) (!) 97.4 F (36.3 C) 98.3 F (36.8 C)  TempSrc: Oral Oral Oral Oral  SpO2: 97% 98% 97% 95%  Weight: 119.7 kg  119.7 kg   Height: 6' 1"  (1.854 m)       Intake/Output Summary (Last 24 hours) at 10/14/2018 1641 Last data filed at 10/14/2018 0406 Gross per 24 hour  Intake 600 ml  Output 1400 ml  Net -800 ml   Filed Weights   10/13/18 1245 10/13/18 1908 10/14/18 0359  Weight: 117.9 kg 119.7 kg 119.7 kg    Examination:  General exam: Appears calm and comfortable  Respiratory system: Clear to auscultation. Respiratory effort normal. Cardiovascular system: S1 & S2 heard, RRR. No JVD, murmurs, rubs, gallops or clicks. No pedal edema. Gastrointestinal system: Abdomen is nondistended, soft and nontender. No organomegaly or masses felt. Normal bowel sounds heard. Central nervous system: Alert and oriented. No focal neurological deficits. Extremities: WWP, NO EDEMA Skin: No rashes, lesions or ulcers Psychiatry: Judgement and insight appear normal. Mood & affect  appropriate.     Data Reviewed: I have personally reviewed following labs and imaging studies  CBC: Recent Labs  Lab 10/13/18 1343 10/14/18 0355  WBC 7.3 8.0  NEUTROABS 4.3  --   HGB 13.7 13.3  HCT 43.0 40.3  MCV 96.4 93.3  PLT 82* 891   Basic Metabolic Panel: Recent Labs  Lab 10/13/18 1343 10/14/18 0355  NA 137 139  K 4.7 4.1  CL 104 102  CO2 22 23  GLUCOSE 257* 262*  BUN 16 14  CREATININE 1.89* 1.92*  CALCIUM 8.9 9.3   GFR: Estimated Creatinine Clearance: 45.7 mL/min (A) (by C-G formula based on SCr of 1.92 mg/dL (H)). Liver Function Tests: Recent Labs  Lab 10/13/18 1343  AST 26  ALT 19  ALKPHOS 29*  BILITOT 1.1  PROT 6.2*  ALBUMIN 3.1*   Recent Labs  Lab 10/13/18 1343  LIPASE 17   No results for input(s): AMMONIA in the last 168 hours. Coagulation Profile: No results for input(s): INR, PROTIME in the last 168 hours. Cardiac Enzymes: Recent Labs  Lab 10/13/18 1343 10/14/18 0603  TROPONINI 0.06* 0.19*   BNP (last 3 results) No results for input(s): PROBNP in the last 8760 hours. HbA1C: No results for input(s): HGBA1C in the last 72 hours. CBG: Recent Labs  Lab 10/13/18 2106 10/14/18 0834 10/14/18 1249 10/14/18 1555  GLUCAP 164* 197* 240* 286*   Lipid Profile: No results for input(s): CHOL, HDL, LDLCALC, TRIG, CHOLHDL, LDLDIRECT in the last 72 hours. Thyroid Function  Tests: No results for input(s): TSH, T4TOTAL, FREET4, T3FREE, THYROIDAB in the last 72 hours. Anemia Panel: No results for input(s): VITAMINB12, FOLATE, FERRITIN, TIBC, IRON, RETICCTPCT in the last 72 hours. Sepsis Labs: No results for input(s): PROCALCITON, LATICACIDVEN in the last 168 hours.  Recent Results (from the past 240 hour(s))  SARS Coronavirus 2 [facility] order, Performed in [place] hospital lab)     Status: None   Collection Time: 10/13/18  6:29 PM  Result Value Ref Range Status   SARS Coronavirus 2 NEGATIVE NEGATIVE Final    Comment: (NOTE) If result  is NEGATIVE SARS-CoV-2 target nucleic acids are NOT DETECTED. The SARS-CoV-2 RNA is generally detectable in upper and lower  respiratory specimens during the acute phase of infection. The lowest  concentration of SARS-CoV-2 viral copies this assay can detect is 250  copies / mL. A negative result does not preclude SARS-CoV-2 infection  and should not be used as the sole basis for treatment or other  patient management decisions.  A negative result may occur with  improper specimen collection / handling, submission of specimen other  than nasopharyngeal swab, presence of viral mutation(s) within the  areas targeted by this assay, and inadequate number of viral copies  (<250 copies / mL). A negative result must be combined with clinical  observations, patient history, and epidemiological information. If result is POSITIVE SARS-CoV-2 target nucleic acids are DETECTED. The SARS-CoV-2 RNA is generally detectable in upper and lower  respiratory specimens dur ing the acute phase of infection.  Positive  results are indicative of active infection with SARS-CoV-2.  Clinical  correlation with patient history and other diagnostic information is  necessary to determine patient infection status.  Positive results do  not rule out bacterial infection or co-infection with other viruses. If result is PRESUMPTIVE POSTIVE SARS-CoV-2 nucleic acids MAY BE PRESENT.   A presumptive positive result was obtained on the submitted specimen  and confirmed on repeat testing.  While 2019 novel coronavirus  (SARS-CoV-2) nucleic acids may be present in the submitted sample  additional confirmatory testing may be necessary for epidemiological  and / or clinical management purposes  to differentiate between  SARS-CoV-2 and other Sarbecovirus currently known to infect humans.  If clinically indicated additional testing with an alternate test  methodology [id number]) is advised. The SARS-CoV-2 RNA is generally    detectable in upper and lower respiratory sp ecimens during the acute  phase of infection. The expected result is Negative. Fact Sheet for Patients:  StrictlyIdeas.no Fact Sheet for Healthcare Providers: BankingDealers.co.za This test is not yet approved or cleared by the [place] FDA and has been authorized for detection and/or diagnosis of SARS-CoV-2 by FDA under an Emergency Use Authorization (EUA).  This EUA will remain in effect (meaning this test can be used) for the duration of the COVID-19 declaration under Section 564(b)(1) of the Act, 21 U.S.C. section 360bbb-3(b)(1), unless the authorization is terminated or revoked sooner. Performed at [facility], [place] [address] [place], [place] [address]          Radiology Studies: Dg Chest 2 View  Result Date: 10/13/2018 CLINICAL DATA:  Left-sided chest pain for the past 2 days. EXAM: CHEST - 2 VIEW COMPARISON:  Chest x-ray dated July 27, 2018. FINDINGS: Stable cardiomediastinal silhouette status post CABG. Normal pulmonary vascularity. Progressive linear scarring/atelectasis in the right lower lobe. No focal consolidation, pleural effusion, or pneumothorax. No acute osseous abnormality. Unchanged fractures of several sternal wires. IMPRESSION: No active  cardiopulmonary disease. Electronically Signed   By: Titus Dubin M.D.   On: 10/13/2018 14:21   Ct Angio Chest/abd/pel For Dissection W And/or Wo Contrast  Result Date: 10/13/2018 CLINICAL DATA:  Chest and abdominal pain. History of abdominal aortic aneurysm. EXAM: CT ANGIOGRAPHY CHEST, ABDOMEN AND PELVIS TECHNIQUE: Multidetector CT imaging through the chest, abdomen and pelvis was performed using the standard protocol during bolus administration of intravenous contrast. Multiplanar reconstructed images and MIPs were obtained and reviewed to evaluate the vascular anatomy. CONTRAST:  28m OMNIPAQUE IOHEXOL 350 MG/ML SOLN  COMPARISON:  None. FINDINGS: CTA CHEST FINDINGS Cardiovascular: The thoracic aorta is normal in caliber and demonstrates no evidence of aneurysmal disease or dissection. Mild aortic atherosclerosis present. Proximal great vessels show no significant obstructive disease. The right subclavian artery is mildly dilated in caliber reaching maximal diameter of 13 mm. Evidence of prior CABG. The heart size is normal. No pericardial fluid identified. Central pulmonary arteries are normal in caliber. Mediastinum/Nodes: No enlarged mediastinal, hilar, or axillary lymph nodes. Thyroid gland, trachea, and esophagus demonstrate no significant findings. Lungs/Pleura: Bronchial thickening and mild bronchiectasis present in the posterior right lower lobe with some distal mucous plugging. Some component of acute bronchitis cannot be excluded. There is no evidence of pulmonary edema, pneumothorax, nodule or pleural fluid. Musculoskeletal: No chest wall abnormality. No acute or significant osseous findings. Review of the MIP images confirms the above findings. CTA ABDOMEN AND PELVIS FINDINGS VASCULAR Aorta: There is irregular predominately noncalcified plaque throughout the abdominal aorta. At the level of the inferior mesenteric artery origin, the aorta is minimally dilated measuring approximately 3.0 x 3.2 cm. No evidence of aortic dissection or rupture. Celiac: Mild irregular plaque in the proximal celiac trunk without significant stenosis. Distal branch vessels are normally patent. SMA: Normally patent. Renals: Single left renal artery is normally patent. The right renal artery has been ligated after prior nephrectomy. IMA: Normally patent. Inflow: Origin of the right common iliac artery demonstrates 50-60% stenosis. Both internal iliac arteries demonstrate diffuse proximal disease. No other significant iliac disease. Common femoral arteries and femoral bifurcations are normally patent. Review of the MIP images confirms the above  findings. NON-VASCULAR Hepatobiliary: No focal liver abnormality is seen. No gallstones, gallbladder wall thickening, or biliary dilatation. Pancreas: Unremarkable. No pancreatic ductal dilatation or surrounding inflammatory changes. Spleen: Normal in size without focal abnormality. Adrenals/Urinary Tract: Status post right nephrectomy. Left kidney demonstrates no mass or hydronephrosis. No adrenal masses identified. Stomach/Bowel: Small a moderate hiatal hernia. No evidence of bowel obstruction, ileus, lesion or inflammation. No free air identified. Lymphatic: No enlarged lymph nodes identified. Reproductive: Prostate is unremarkable. Other: Bilateral small inguinal hernias contain fat. No ascites or focal abscess identified. Musculoskeletal: Degenerative disc disease at L4-5. Review of the MIP images confirms the above findings. IMPRESSION: 1. 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 2. No evidence of thoracic aortic aneurysmal disease or dissection. 3. 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 No evidence of abdominal aortic dissection or rupture. 4. 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 5. Status post right nephrectomy. 6. Moderate hiatal hernia. 7. Bilateral small inguinal hernias containing fat. Electronically Signed   By: GAletta EdouardM.D.   On: 10/13/2018 15:38        Scheduled Meds:  aspirin  81 mg Oral Daily   buPROPion  150 mg Oral Daily   dronedarone  400 mg Oral BID WC   ezetimibe  5 mg Oral QHS   fenofibrate  54 mg Oral QHS   ferrous sulfate  325 mg Oral Once per day on Tue Wed Fri   finasteride  5 mg Oral QHS   gabapentin  300 mg Oral BID   insulin aspart  0-9 Units Subcutaneous TID WC   insulin detemir  25 Units Subcutaneous BID   isosorbide mononitrate  60 mg Oral Daily   levothyroxine  175 mcg Oral QAC breakfast   magnesium oxide  400 mg Oral BID   metoprolol  succinate  12.5 mg Oral Daily   rosuvastatin  20 mg Oral QHS   senna  2 tablet Oral q morning - 10a   And   senna  1 tablet Oral QHS   sodium chloride flush  3 mL Intravenous Q12H   sucralfate  1 g Oral TID WC & HS   traZODone  50 mg Oral QHS   Continuous Infusions:  sodium chloride     sodium chloride     heparin 1,250 Units/hr (10/14/18 1211)     LOS: 0 days    Time spent: Chaun[place]y    Nicolette Bang, MD [facility]  If 7PM-7AM, please contact night-coverage  10/14/2018, 4:41 PM

## 2018-10-14 NOTE — Progress Notes (Signed)
ANTICOAGULATION CONSULT NOTE - Follow-Up Consult  Pharmacy Consult for heparin Indication: chest pain/ACS  Patient Measurements: Height: 6' 1"  (185.4 cm) Weight: 263 lb 14.3 oz (119.7 kg) IBW/kg (Calculated) : 79.9 Heparin Dosing Weight:105 kg  Vital Signs: Temp: 98.1 F (36.7 C) (05/05 2027) Temp Source: Oral (05/05 2027) BP: 125/73 (05/05 2027) Pulse Rate: 58 (05/05 2027)  Labs: Recent Labs    10/13/18 1343 10/14/18 0355 10/14/18 0603 10/14/18 0912 10/14/18 1947 10/14/18 2112  HGB 13.7 13.3  --   --   --   --   HCT 43.0 40.3  --   --   --   --   PLT 82* 214  --   --   --   --   APTT  --   --   --  40* 135* 108*  CREATININE 1.89* 1.92*  --   --   --   --   TROPONINI 0.06*  --  0.19*  --   --   --      Assessment: Admitted with chest pain. Troponins have trended up. Mr. Besecker is on Eliquis prior to admission for AFib. His last dose was yesterday evening. Will transition patient to heparin infusion for ACS in anticipation of cardiac cath tomorrow. H/h plts ok. SCr 1.5 > 1.9.  Initial aPTT is slightly above goal at 108 seconds (drawn appropriately). Will reduce infusion slightly and recheck in 8hr.   Goal of Therapy:  Heparin level 0.3-0.7 units/ml Monitor platelets by anticoagulation protocol: Yes    Plan:  -Reduce heparin to 1200 units/hr -Recheck aPTT along with heparin level in [place], PharmD, BCPS Clinical Pharmacist [phone] Please check AMION for all [place] numbers 10/14/2018

## 2018-10-14 NOTE — Progress Notes (Signed)
ANTICOAGULATION CONSULT NOTE - Initial Consult  Pharmacy Consult for heparin Indication: chest pain/ACS  Patient Measurements: Height: 6' 1"  (185.4 cm) Weight: 263 lb 14.3 oz (119.7 kg) IBW/kg (Calculated) : 79.9 Heparin Dosing Weight:105 kg  Vital Signs: Temp: 97.4 F (36.3 C) (05/05 0359) Temp Source: Oral (05/05 0359) BP: 94/71 (05/05 0359) Pulse Rate: 70 (05/05 0359)  Labs: Recent Labs    10/13/18 1343 10/14/18 0355 10/14/18 0603  HGB 13.7 13.3  --   HCT 43.0 40.3  --   PLT 82* 214  --   CREATININE 1.89* 1.92*  --   TROPONINI 0.06*  --  0.19*     Assessment: Admitted with chest pain. Troponins have trended up. Mr. Pantoja is on Eliquis prior to admission for AFib. His last dose was yesterday evening. Will transition patient to heparin infusion for ACS in anticipation of cardiac cath tomorrow. H/h plts ok. SCr 1.5 > 1.9.   Goal of Therapy:  Heparin level 0.3-0.7 units/ml Monitor platelets by anticoagulation protocol: Yes    Plan:  -Heparin 4000 units x1 then 1250 units/hr -Daily HL, CBC -Check confirmatory level this afternoon   Harvel Quale 10/14/2018,8:49 AM

## 2018-10-14 NOTE — Progress Notes (Addendum)
Progress Note  Patient Name: Matthew [name] Date of Encounter: 10/14/2018  Primary Cardiologist: Larae Grooms, MD   Subjective   Pt feels well today .  No further episodes of chest discomfort.  Troponin level is up to 0.19.  Inpatient Medications    Scheduled Meds:  apixaban  5 mg Oral BID   aspirin  81 mg Oral Daily   buPROPion  150 mg Oral Daily   dronedarone  400 mg Oral BID WC   ezetimibe  5 mg Oral QHS   fenofibrate  54 mg Oral QHS   ferrous sulfate  325 mg Oral Once per day on Tue Wed Fri   finasteride  5 mg Oral QHS   gabapentin  300 mg Oral BID   insulin aspart  0-9 Units Subcutaneous TID WC   insulin detemir  25 Units Subcutaneous BID   isosorbide mononitrate  60 mg Oral Daily   levothyroxine  175 mcg Oral QAC breakfast   magnesium oxide  400 mg Oral BID   metoprolol succinate  12.5 mg Oral Daily   rosuvastatin  20 mg Oral QHS   senna  2 tablet Oral q morning - 10a   And   senna  1 tablet Oral QHS   sucralfate  1 g Oral TID WC & HS   traZODone  50 mg Oral QHS   Continuous Infusions:  PRN Meds: acetaminophen **OR** acetaminophen, oxyCODONE **AND** acetaminophen, ALPRAZolam, hydrOXYzine, linaclotide, mesalamine, mesalamine, morphine injection, nitroGLYCERIN, nortriptyline, ondansetron **OR** ondansetron (ZOFRAN) IV   Vital Signs    Vitals:   10/13/18 1815 10/13/18 1908 10/13/18 2100 10/14/18 0359  BP: 106/64 (!) 125/58 107/88 94/71  Pulse: (!) 57 (!) 54 (!) 56 70  Resp: 15 15 17 18   Temp:  97.8 F (36.6 C) 97.7 F (36.5 C) (!) 97.4 F (36.3 C)  TempSrc:  Oral Oral Oral  SpO2: 98% 97% 98% 97%  Weight:  119.7 kg  119.7 kg  Height:  6' 1"  (1.854 m)      Intake/Output Summary (Last 24 hours) at 10/14/2018 0721 Last data filed at 10/14/2018 0406 Gross per 24 hour  Intake 600 ml  Output 1400 ml  Net -800 ml   Last 3 Weights 10/14/2018 10/13/2018 10/13/2018  Weight (lbs) 263 lb 14.3 oz 263 lb 12.8 oz 260 lb  Weight (kg) 119.7 kg  119.659 kg 117.935 kg      Telemetry     NSR  - Personally Reviewed  ECG    No new ECG tracings today. - Personally Reviewed  Physical Exam   Physical Exam per MD:    GEN: No acute distress.   Neck: no jvd Cardiac: RR, no significant murmur Respiratory: Clear to auscultation bilaterally. GI: Soft, non-tender, non-distended. MS: no edema,  Good pulses Neuro:  No focal deficits. Psych: Normal affect.  Labs    Chemistry Recent Labs  Lab 10/13/18 1343 10/14/18 0355  NA 137 139  K 4.7 4.1  CL 104 102  CO2 22 23  GLUCOSE 257* 262*  BUN 16 14  CREATININE 1.89* 1.92*  CALCIUM 8.9 9.3  PROT 6.2*  --   ALBUMIN 3.1*  --   AST 26  --   ALT 19  --   ALKPHOS 29*  --   BILITOT 1.1  --   GFRNONAA 34* 34*  GFRAA 40* 39*  ANIONGAP 11 14     Hematology Recent Labs  Lab 10/13/18 1343 10/14/18 0355  WBC 7.3 8.0  RBC  4.46 4.32  HGB 13.7 13.3  HCT 43.0 40.3  MCV 96.4 93.3  MCH 30.7 30.8  MCHC 31.9 33.0  RDW 13.4 13.5  PLT 82* 214    Cardiac Enzymes Recent Labs  Lab 10/13/18 1343 10/14/18 0603  TROPONINI 0.06* 0.19*   No results for input(s): TROPIPOC in the last 168 hours.   BNPNo results for input(s): BNP, PROBNP in the last 168 hours.   DDimer No results for input(s): DDIMER in the last 168 hours.   Radiology    Dg Chest 2 View  Result Date: 10/13/2018 CLINICAL DATA:  Left-sided chest pain for the past 2 days. EXAM: CHEST - 2 VIEW COMPARISON:  Chest x-ray dated July 27, 2018. FINDINGS: Stable cardiomediastinal silhouette status post CABG. Normal pulmonary vascularity. Progressive linear scarring/atelectasis in the right lower lobe. No focal consolidation, pleural effusion, or pneumothorax. No acute osseous abnormality. Unchanged fractures of several sternal wires. IMPRESSION: No active cardiopulmonary disease. Electronically Signed   By: Titus Dubin M.D.   On: 10/13/2018 14:21   Ct Angio Chest/abd/pel For Dissection W And/or Wo Contrast  Result  Date: 10/13/2018 CLINICAL DATA:  Chest and abdominal pain. History of abdominal aortic aneurysm. EXAM: CT ANGIOGRAPHY CHEST, ABDOMEN AND PELVIS TECHNIQUE: Multidetector CT imaging through the chest, abdomen and pelvis was performed using the standard protocol during bolus administration of intravenous contrast. Multiplanar reconstructed images and MIPs were obtained and reviewed to evaluate the vascular anatomy. CONTRAST:  50m OMNIPAQUE IOHEXOL 350 MG/ML SOLN COMPARISON:  None. FINDINGS: CTA CHEST FINDINGS Cardiovascular: The thoracic aorta is normal in caliber and demonstrates no evidence of aneurysmal disease or dissection. Mild aortic atherosclerosis present. Proximal great vessels show no significant obstructive disease. The right subclavian artery is mildly dilated in caliber reaching maximal diameter of 13 mm. Evidence of prior CABG. The heart size is normal. No pericardial fluid identified. Central pulmonary arteries are normal in caliber. Mediastinum/Nodes: No enlarged mediastinal, hilar, or axillary lymph nodes. Thyroid gland, trachea, and esophagus demonstrate no significant findings. Lungs/Pleura: Bronchial thickening and mild bronchiectasis present in the posterior right lower lobe with some distal mucous plugging. Some component of acute bronchitis cannot be excluded. There is no evidence of pulmonary edema, pneumothorax, nodule or pleural fluid. Musculoskeletal: No chest wall abnormality. No acute or significant osseous findings. Review of the MIP images confirms the above findings. CTA ABDOMEN AND PELVIS FINDINGS VASCULAR Aorta: There is irregular predominately noncalcified plaque throughout the abdominal aorta. At the level of the inferior mesenteric artery origin, the aorta is minimally dilated measuring approximately 3.0 x 3.2 cm. No evidence of aortic dissection or rupture. Celiac: Mild irregular plaque in the proximal celiac trunk without significant stenosis. Distal branch vessels are normally  patent. SMA: Normally patent. Renals: Single left renal artery is normally patent. The right renal artery has been ligated after prior nephrectomy. IMA: Normally patent. Inflow: Origin of the right common iliac artery demonstrates 50-60% stenosis. Both internal iliac arteries demonstrate diffuse proximal disease. No other significant iliac disease. Common femoral arteries and femoral bifurcations are normally patent. Review of the MIP images confirms the above findings. NON-VASCULAR Hepatobiliary: No focal liver abnormality is seen. No gallstones, gallbladder wall thickening, or biliary dilatation. Pancreas: Unremarkable. No pancreatic ductal dilatation or surrounding inflammatory changes. Spleen: Normal in size without focal abnormality. Adrenals/Urinary Tract: Status post right nephrectomy. Left kidney demonstrates no mass or hydronephrosis. No adrenal masses identified. Stomach/Bowel: Small a moderate hiatal hernia. No evidence of bowel obstruction, ileus, lesion or inflammation. No free air  identified. Lymphatic: No enlarged lymph nodes identified. Reproductive: Prostate is unremarkable. Other: Bilateral small inguinal hernias contain fat. No ascites or focal abscess identified. Musculoskeletal: Degenerative disc disease at L4-5. Review of the MIP images confirms the above findings. IMPRESSION: 1. 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 2. No evidence of thoracic aortic aneurysmal disease or dissection. 3. 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 No evidence of abdominal aortic dissection or rupture. 4. 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 5. Status post right nephrectomy. 6. Moderate hiatal hernia. 7. Bilateral small inguinal hernias containing fat. Electronically Signed   By: Aletta Edouard M.D.   On: 10/13/2018 15:38    Cardiac Studies   Left Heart Catheterization 10/10/2016:  Prox LAD to Mid LAD lesion,  100 %stenosed.  Prox RCA to Mid RCA lesion, 100 %stenosed.  Mid Cx lesion, 95 %stenosed.  Ost 2nd Mrg to 2nd Mrg lesion, 100 %stenosed.  Dist Cx lesion, 100 %stenosed.  LIMA and is normal in caliber and anatomically normal.  SVG and is normal in caliber and anatomically normal.  Seq SVG- OM2-OM3 and is normal in caliber.  Dist Graft-2 lesion, 50 %stenosed.  SVG.  Origin lesion, 100 %stenosed.  A STENT SYNERGY DES 3X28 drug eluting stent was successfully placed.  Dist Graft-1 lesion, 95 %stenosed.  Post intervention, there is a 0% residual stenosis.  Prox Graft to Mid Graft lesion, 25 %stenosed.   Severe native CAD with total occlusion of the proximal LAD at a site of prior proximal stenting after a small diagonal vessel; subtotal occlusion of the circumflex vessel prior to the OM2, with occlusion of the ostium of the OM1, and diffuse 95% mid AV groove stenosis with total distal occlusion before the distal marginal branch; and total proximal RCA occlusion.  Patent LIMA graft which supplies the mid LAD and 2 diagonal vessels.  The sequential vein graft which supplies a large OM 2 - OM3  has mild diffuse 20-30% stenosis proximally in the graft and in the mid body of the graft there is a sequential stenosis of 95%, followed by eccentric 50% narrowing with TIMI-3 flow down both marginal branches.  Ostially occluded vein graft which has supplied a diagonal vessel.  Patent SVG to the distal RCA.  Successful PCI to the sequential SVG supplying the marginal vessels with ultimate insertion of a 3.028 mm Synergy DES stent to cover both lesions, postdilated to 3.18 mm with the 95 and 50% stenoses being reduced to 0%.  There is brisk TIMI-3 flow down to both marginal branches.  Recommendation: The patient has a history of atrial fibrillation but is in sinus rhythm today.  Eliquis will most likely be reinstituted and the patient will be on short-term triple drug therapy with  ultimate plans for Plavix/Eliquis. _______________  Echocardiogram 10/11/2016: Study Conclusions: - Left ventricle: The cavity size was normal. Wall thickness was   increased in a pattern of moderate LVH. Systolic function was   normal. The estimated ejection fraction was in the range of 55%   to 60%. Features are consistent with a pseudonormal left   ventricular filling pattern, with concomitant abnormal relaxation   and increased filling pressure (grade 2 diastolic dysfunction). - Mitral valve: Calcified annulus. Mildly thickened leaflets .   There was mild regurgitation. - Left atrium: The atrium was mildly dilated. - Impressions: Poor acoustic windows limit study.  Impressions: - Poor acoustic windows limit study.  Patient Profile  Mr. Mikes is a 73 year old male with a history of CAD s/p CABG x4 in 2010, paroxysmal atrial fibrillation, AAA, carotid artery disease, diabetes mellitus, CKD, and renal cancer s/p right nephrectomy who presented with chest pain.  Assessment & Plan    Chest Pain with Elevated Troponin - Presentation somewhat atypical with upper abdominal pain and worsening after eating; however, pain did improve with sublingual Nitro. - EKG showed no acute ischemia - Troponin minimally elevated at 0.06 >> 0.19. - Given history of renal cell cancer s/p right nephrectomy not ideal candidate for invasive procedure. Serum creatinine 1.92 today. - Plan is for [employer] today.  CAD s/p CABG  - Patient has history of CABG x4 in 2010 with subsequent PCI with DES to SVG-OM2 in 2018. - Continue aspirin, beta-blocker, and high-intensity statin/zetia. - Continue Imdur 41m daily.  Paroxysmal Atrial Fibrillation  - Continue home Toprol-XL 12.523mdaily and Multaq 40070mwice daily. - Continue chronic anticoagulation with Eliquis 5mg35mice daily.  CKD/ Renal Cancer s/p Right Nephrectomy in 2002 - Baseline creatinine around 1.5 to 1.7. Creatinine 1.9  today.  Thrombocytopenia - Platelets 82 on admission but 214 on repeat this morning.  For questions or updates, please contact [facility] consult [email or web address] for contact info under        Signed, CallDarreld Mclean-C  10/14/2018, 7:21 AM    Attending Note:   The patient was seen and examined.  Agree with assessment and plan as noted above.  Changes made to the above note as needed.  Patient seen and independently examined with CallSande Rives .   We discussed all aspects of the encounter. I agree with the assessment and plan as stated above.  1.  Coronary artery disease: The patient has a history of coronary artery disease-status post bypass grafting and several stent procedures.  He was admitted with somewhat atypical chest pain yesterday.  His troponins have increased from 0.06-0.19.  We will cancel the Myoview for today.  Will anticipate doing a heart catheterization tomorrow.  He had a contrast CT scan yesterday and his creatinine is up slightly from that.  We will hydrate him gently overnight in anticipation of heart catheterization tomorrow.  I have discussed the risks, benefits, options of heart catheterization.  He understands and agrees to proceed.   2.  Chronic kidney disease: History of his creatinine is up slightly following a CT with contrast.  We will hydrate him gently tonight and recheck tomorrow morning.  3.  Paroxysmal atrial fibrillation: Continue multi.  He is in normal sinus rhythm currently.  We will hold Eliquis for now and start him on heparin.  4.  Hyperlipidemia: Continue Zetia.    I have spent a total of 40 minutes with patient reviewing hospital  notes , telemetry, EKGs, labs and examining patient as well as establishing an assessment and plan that was discussed with the patient. > 50% of time was spent in direct patient care.    PhilThayer Headings.,Brooke BonitoD, FACCRush Foundation Hospital/2020, 8:33 AM [id number] [phone]

## 2018-10-15 ENCOUNTER — Encounter (HOSPITAL_COMMUNITY)
Admission: EM | Disposition: A | Discharge status: Home / Self Care | Payer: Self-pay | Source: Home / Self Care | Attending: Internal Medicine

## 2018-10-15 DIAGNOSIS — I480 Paroxysmal atrial fibrillation: Secondary | ICD-10-CM

## 2018-10-15 DIAGNOSIS — I25810 Atherosclerosis of coronary artery bypass graft(s) without angina pectoris: Secondary | ICD-10-CM

## 2018-10-15 DIAGNOSIS — R7989 Other specified abnormal findings of blood chemistry: Secondary | ICD-10-CM

## 2018-10-15 DIAGNOSIS — I714 Abdominal aortic aneurysm, without rupture: Secondary | ICD-10-CM

## 2018-10-15 DIAGNOSIS — I214 Non-ST elevation (NSTEMI) myocardial infarction: Principal | ICD-10-CM

## 2018-10-15 HISTORY — PX: LEFT HEART CATH AND CORS/GRAFTS ANGIOGRAPHY: CATH118250

## 2018-10-15 HISTORY — PX: CORONARY STENT INTERVENTION: CATH118234

## 2018-10-15 LAB — BASIC METABOLIC PANEL
Anion gap: 8 (ref 5–15)
BUN: 20 mg/dL (ref 8–23)
CO2: 26 mmol/L (ref 22–32)
Calcium: 9.2 mg/dL (ref 8.9–10.3)
Chloride: 103 mmol/L (ref 98–111)
Creatinine, Ser: 1.93 mg/dL — ABNORMAL HIGH (ref 0.61–1.24)
GFR calc Af Amer: 39 mL/min — ABNORMAL LOW (ref >60)
GFR calc non Af Amer: 33 mL/min — ABNORMAL LOW (ref >60)
Glucose, Bld: 253 mg/dL — ABNORMAL HIGH (ref 70–99)
Potassium: 4.1 mmol/L (ref 3.5–5.1)
Sodium: 137 mmol/L (ref 135–145)

## 2018-10-15 LAB — CBC
HCT: 36.5 % — ABNORMAL LOW (ref 39.0–52.0)
Hemoglobin: 12 g/dL — ABNORMAL LOW (ref 13.0–17.0)
MCH: 30.5 pg (ref 26.0–34.0)
MCHC: 32.9 g/dL (ref 30.0–36.0)
MCV: 92.6 fL (ref 80.0–100.0)
Platelets: 183 10*3/uL (ref 150–400)
RBC: 3.94 MIL/uL — ABNORMAL LOW (ref 4.22–5.81)
RDW: 13.3 % (ref 11.5–15.5)
WBC: 7.1 10*3/uL (ref 4.0–10.5)
nRBC: 0 % (ref 0.0–0.2)

## 2018-10-15 LAB — HEPARIN LEVEL (UNFRACTIONATED): Heparin Unfractionated: 1.04 IU/mL — ABNORMAL HIGH (ref 0.30–0.70)

## 2018-10-15 LAB — APTT: aPTT: 77 seconds — ABNORMAL HIGH (ref 24–36)

## 2018-10-15 LAB — GLUCOSE, CAPILLARY
Glucose-Capillary: 261 mg/dL — ABNORMAL HIGH (ref 70–99)
Glucose-Capillary: 164 mg/dL — ABNORMAL HIGH (ref 70–99)
Glucose-Capillary: 107 mg/dL — ABNORMAL HIGH (ref 70–99)
Glucose-Capillary: 197 mg/dL — ABNORMAL HIGH (ref 70–99)

## 2018-10-15 LAB — POCT ACTIVATED CLOTTING TIME: Activated Clotting Time: 279 seconds

## 2018-10-15 SURGERY — LEFT HEART CATH AND CORS/GRAFTS ANGIOGRAPHY
Anesthesia: LOCAL

## 2018-10-15 MED ORDER — HEPARIN (PORCINE) IN NACL 1000-0.9 UT/500ML-% IV SOLN
INTRAVENOUS | Status: DC | PRN
  Administered 2018-10-15 (×2): 500 mL

## 2018-10-15 MED ORDER — HYDRALAZINE HCL 20 MG/ML IJ SOLN: 10.0000 mg | INTRAMUSCULAR | Status: AC | PRN

## 2018-10-15 MED ORDER — FAMOTIDINE IN NACL 20-0.9 MG/50ML-% IV SOLN
INTRAVENOUS | Status: AC
  Filled 2018-10-15: qty 50

## 2018-10-15 MED ORDER — CLOPIDOGREL BISULFATE 300 MG PO TABS
ORAL_TABLET | ORAL | Status: AC
  Filled 2018-10-15: qty 2

## 2018-10-15 MED ORDER — LIDOCAINE HCL (PF) 1 % IJ SOLN
INTRAMUSCULAR | Status: DC | PRN
  Administered 2018-10-15: 2 mL

## 2018-10-15 MED ORDER — FENTANYL CITRATE (PF) 100 MCG/2ML IJ SOLN
INTRAMUSCULAR | Status: AC
  Filled 2018-10-15: qty 2

## 2018-10-15 MED ORDER — CLOPIDOGREL BISULFATE 300 MG PO TABS
ORAL_TABLET | ORAL | Status: DC | PRN
  Administered 2018-10-15: 600 mg via ORAL

## 2018-10-15 MED ORDER — HEPARIN (PORCINE) IN NACL 1000-0.9 UT/500ML-% IV SOLN
INTRAVENOUS | Status: AC
  Filled 2018-10-15: qty 1000

## 2018-10-15 MED ORDER — HEPARIN SODIUM (PORCINE) 1000 UNIT/ML IJ SOLN
INTRAMUSCULAR | Status: DC | PRN
  Administered 2018-10-15 (×2): 6000 [IU] via INTRAVENOUS

## 2018-10-15 MED ORDER — IOHEXOL 350 MG/ML SOLN
INTRAVENOUS | Status: DC | PRN
  Administered 2018-10-15: 165 mL via INTRACARDIAC

## 2018-10-15 MED ORDER — VERAPAMIL HCL 2.5 MG/ML IV SOLN
INTRAVENOUS | Status: AC
  Filled 2018-10-15: qty 2

## 2018-10-15 MED ORDER — CLOPIDOGREL BISULFATE 75 MG PO TABS
75.0000 mg | ORAL_TABLET | Freq: Every day | ORAL | Status: DC
  Administered 2018-10-16: 09:00:00 75 mg via ORAL
  Filled 2018-10-15: qty 1

## 2018-10-15 MED ORDER — HEPARIN SODIUM (PORCINE) 1000 UNIT/ML IJ SOLN
INTRAMUSCULAR | Status: AC
  Filled 2018-10-15: qty 1

## 2018-10-15 MED ORDER — MIDAZOLAM HCL 2 MG/2ML IJ SOLN
INTRAMUSCULAR | Status: AC
  Filled 2018-10-15: qty 2

## 2018-10-15 MED ORDER — SODIUM CHLORIDE 0.9 % IV SOLN: INTRAVENOUS | Status: AC

## 2018-10-15 MED ORDER — MIDAZOLAM HCL 2 MG/2ML IJ SOLN
INTRAMUSCULAR | Status: DC | PRN
  Administered 2018-10-15 (×2): 1 mg via INTRAVENOUS

## 2018-10-15 MED ORDER — LIDOCAINE HCL (PF) 1 % IJ SOLN
INTRAMUSCULAR | Status: AC
  Filled 2018-10-15: qty 30

## 2018-10-15 MED ORDER — FAMOTIDINE IN NACL 20-0.9 MG/50ML-% IV SOLN
INTRAVENOUS | Status: AC | PRN
  Administered 2018-10-15: 20 mg via INTRAVENOUS

## 2018-10-15 MED ORDER — SODIUM CHLORIDE 0.9% FLUSH: 3.0000 mL | INTRAVENOUS | Status: DC | PRN

## 2018-10-15 MED ORDER — VERAPAMIL HCL 2.5 MG/ML IV SOLN
INTRAVENOUS | Status: DC | PRN
  Administered 2018-10-15: 10 mL via INTRA_ARTERIAL

## 2018-10-15 MED ORDER — SODIUM CHLORIDE 0.9 % IV SOLN: 250.0000 mL | INTRAVENOUS | Status: DC | PRN

## 2018-10-15 MED ORDER — LABETALOL HCL 5 MG/ML IV SOLN: 10.0000 mg | INTRAVENOUS | Status: AC | PRN

## 2018-10-15 MED ORDER — FENTANYL CITRATE (PF) 100 MCG/2ML IJ SOLN
INTRAMUSCULAR | Status: DC | PRN
  Administered 2018-10-15 (×2): 25 ug via INTRAVENOUS

## 2018-10-15 MED ORDER — SODIUM CHLORIDE 0.9% FLUSH: 3.0000 mL | Freq: Two times a day (BID) | INTRAVENOUS | Status: DC

## 2018-10-15 SURGICAL SUPPLY — 19 items
BALLN ~~LOC~~ EMERGE MR 3.0X20 (BALLOONS) ×2
BALLOON ~~LOC~~ EMERGE MR 3.0X20 (BALLOONS) IMPLANT
CATH INFINITI 5 FR AR1 MOD (CATHETERS) ×1 IMPLANT
CATH INFINITI 5 FR IM (CATHETERS) ×1 IMPLANT
CATH INFINITI 5 FR RCB (CATHETERS) ×1 IMPLANT
CATH INFINITI 5FR MULTPACK ANG (CATHETERS) ×1 IMPLANT
CATHETER LAUNCHER 6FR LCB (CATHETERS) ×1 IMPLANT
COVER DOME SNAP 22 D (MISCELLANEOUS) ×1 IMPLANT
DEVICE RAD COMP TR BAND LRG (VASCULAR PRODUCTS) ×1 IMPLANT
GLIDESHEATH SLEND SS 6F .021 (SHEATH) ×1 IMPLANT
GUIDEWIRE INQWIRE 1.5J.035X260 (WIRE) IMPLANT
INQWIRE 1.5J .035X260CM (WIRE) ×2
KIT ENCORE 26 ADVANTAGE (KITS) ×1 IMPLANT
KIT HEART LEFT (KITS) ×2 IMPLANT
PACK CARDIAC CATHETERIZATION (CUSTOM PROCEDURE TRAY) ×2 IMPLANT
STENT RESOLUTE ONYX3.0X38 (Permanent Stent) ×1 IMPLANT
TRANSDUCER W/STOPCOCK (MISCELLANEOUS) ×2 IMPLANT
TUBING CIL FLEX 10 FLL-RA (TUBING) ×2 IMPLANT
WIRE COUGAR XT STRL 190CM (WIRE) ×1 IMPLANT

## 2018-10-15 NOTE — Progress Notes (Signed)
Progress Note  Patient Name: Matthew [name] Date of Encounter: 10/15/2018  Primary Cardiologist: Larae Grooms, MD   Subjective   73 yo admitted with CP  Found to have elevated troponins c/w NSTEMI  Scheduled for cath today   Inpatient Medications    Scheduled Meds: . aspirin  81 mg Oral Daily  . buPROPion  150 mg Oral Daily  . dronedarone  400 mg Oral BID WC  . ezetimibe  5 mg Oral QHS  . fenofibrate  54 mg Oral QHS  . ferrous sulfate  325 mg Oral Once per day on Tue Wed Fri  . finasteride  5 mg Oral QHS  . gabapentin  300 mg Oral BID  . insulin aspart  0-9 Units Subcutaneous TID WC  . insulin detemir  25 Units Subcutaneous BID  . isosorbide mononitrate  60 mg Oral Daily  . levothyroxine  175 mcg Oral QAC breakfast  . magnesium oxide  400 mg Oral BID  . metoprolol succinate  12.5 mg Oral Daily  . rosuvastatin  20 mg Oral QHS  . senna  2 tablet Oral q morning - 10a   And  . senna  1 tablet Oral QHS  . sodium chloride flush  3 mL Intravenous Q12H  . sucralfate  1 g Oral TID WC & HS  . traZODone  50 mg Oral QHS   Continuous Infusions: . sodium chloride    . sodium chloride 50 mL/hr at 10/14/18 1839  . heparin 1,200 Units/hr (10/15/18 0317)   PRN Meds: sodium chloride, acetaminophen **OR** acetaminophen, oxyCODONE **AND** acetaminophen, ALPRAZolam, hydrOXYzine, linaclotide, mesalamine, mesalamine, morphine injection, nitroGLYCERIN, nortriptyline, ondansetron **OR** ondansetron (ZOFRAN) IV, sodium chloride flush   Vital Signs    Vitals:   10/14/18 0359 10/14/18 1433 10/14/18 2027 10/15/18 0646  BP: 94/71 (!) 71/39 125/73 (!) 106/58  Pulse: 70 (!) 57 (!) 58 62  Resp: 18 18 18 18   Temp: (!) 97.4 F (36.3 C) 98.3 F (36.8 C) 98.1 F (36.7 C) 97.9 F (36.6 C)  TempSrc: Oral Oral Oral Oral  SpO2: 97% 95% 100% 95%  Weight: 119.7 kg     Height:        Intake/Output Summary (Last 24 hours) at 10/15/2018 0808 Last data filed at 10/15/2018 0600 Gross per 24  hour  Intake -  Output 400 ml  Net -400 ml   Last 3 Weights 10/14/2018 10/13/2018 10/13/2018  Weight (lbs) 263 lb 14.3 oz 263 lb 12.8 oz 260 lb  Weight (kg) 119.7 kg 119.659 kg 117.935 kg      Telemetry     NSR  - Personally Reviewed  ECG    No new ECG tracings today. - Personally Reviewed  Physical Exam   Physical Exam per MD:    Physical Exam: Blood pressure (!) 96/50, pulse 62, temperature 97.9 F (36.6 C), temperature source Oral, resp. rate 18, height 6' 1"  (1.854 m), weight 119.7 kg, SpO2 95 %.  GEN:   Chronically ill-appearing gentleman.  Moderately obese. HEENT: Normal NECK: No JVD; No carotid bruits LYMPHATICS: No lymphadenopathy CARDIAC: Regular rate S1-S2. RESPIRATORY:  Clear to auscultation without rales, wheezing or rhonchi  ABDOMEN: Moderately obese. MUSCULOSKELETAL: Good pulses.  No edema. SKIN: Warm and dry NEUROLOGIC:  Alert and oriented x 3   Labs    Chemistry Recent Labs  Lab 10/13/18 1343 10/14/18 0355 10/15/18 0651  NA 137 139 137  K 4.7 4.1 4.1  CL 104 102 103  CO2 22 23 26  GLUCOSE 257* 262* 253*  BUN 16 14 20   CREATININE 1.89* 1.92* 1.93*  CALCIUM 8.9 9.3 9.2  PROT 6.2*  --   --   ALBUMIN 3.1*  --   --   AST 26  --   --   ALT 19  --   --   ALKPHOS 29*  --   --   BILITOT 1.1  --   --   GFRNONAA 34* 34* 33*  GFRAA 40* 39* 39*  ANIONGAP 11 14 8      Hematology Recent Labs  Lab 10/13/18 1343 10/14/18 0355 10/15/18 0628  WBC 7.3 8.0 7.1  RBC 4.46 4.32 3.94*  HGB 13.7 13.3 12.0*  HCT 43.0 40.3 36.5*  MCV 96.4 93.3 92.6  MCH 30.7 30.8 30.5  MCHC 31.9 33.0 32.9  RDW 13.4 13.5 13.3  PLT 82* 214 183    Cardiac Enzymes Recent Labs  Lab 10/13/18 1343 10/14/18 0603  TROPONINI 0.06* 0.19*   No results for input(s): TROPIPOC in the last 168 hours.   BNPNo results for input(s): BNP, PROBNP in the last 168 hours.   DDimer No results for input(s): DDIMER in the last 168 hours.   Radiology    Dg Chest 2 View  Result Date:  10/13/2018 CLINICAL DATA:  Left-sided chest pain for the past 2 days. EXAM: CHEST - 2 VIEW COMPARISON:  Chest x-ray dated July 27, 2018. FINDINGS: Stable cardiomediastinal silhouette status post CABG. Normal pulmonary vascularity. Progressive linear scarring/atelectasis in the right lower lobe. No focal consolidation, pleural effusion, or pneumothorax. No acute osseous abnormality. Unchanged fractures of several sternal wires. IMPRESSION: No active cardiopulmonary disease. Electronically Signed   By: Titus Dubin M.D.   On: 10/13/2018 14:21   Ct Angio Chest/abd/pel For Dissection W And/or Wo Contrast  Result Date: 10/13/2018 CLINICAL DATA:  Chest and abdominal pain. History of abdominal aortic aneurysm. EXAM: CT ANGIOGRAPHY CHEST, ABDOMEN AND PELVIS TECHNIQUE: Multidetector CT imaging through the chest, abdomen and pelvis was performed using the standard protocol during bolus administration of intravenous contrast. Multiplanar reconstructed images and MIPs were obtained and reviewed to evaluate the vascular anatomy. CONTRAST:  57m OMNIPAQUE IOHEXOL 350 MG/ML SOLN COMPARISON:  None. FINDINGS: CTA CHEST FINDINGS Cardiovascular: The thoracic aorta is normal in caliber and demonstrates no evidence of aneurysmal disease or dissection. Mild aortic atherosclerosis present. Proximal great vessels show no significant obstructive disease. The right subclavian artery is mildly dilated in caliber reaching maximal diameter of 13 mm. Evidence of prior CABG. The heart size is normal. No pericardial fluid identified. Central pulmonary arteries are normal in caliber. Mediastinum/Nodes: No enlarged mediastinal, hilar, or axillary lymph nodes. Thyroid gland, trachea, and esophagus demonstrate no significant findings. Lungs/Pleura: Bronchial thickening and mild bronchiectasis present in the posterior right lower lobe with some distal mucous plugging. Some component of acute bronchitis cannot be excluded. There is no evidence  of pulmonary edema, pneumothorax, nodule or pleural fluid. Musculoskeletal: No chest wall abnormality. No acute or significant osseous findings. Review of the MIP images confirms the above findings. CTA ABDOMEN AND PELVIS FINDINGS VASCULAR Aorta: There is irregular predominately noncalcified plaque throughout the abdominal aorta. At the level of the inferior mesenteric artery origin, the aorta is minimally dilated measuring approximately 3.0 x 3.2 cm. No evidence of aortic dissection or rupture. Celiac: Mild irregular plaque in the proximal celiac trunk without significant stenosis. Distal branch vessels are normally patent. SMA: Normally patent. Renals: Single left renal artery is normally patent. The right renal artery has  been ligated after prior nephrectomy. IMA: Normally patent. Inflow: Origin of the right common iliac artery demonstrates 50-60% stenosis. Both internal iliac arteries demonstrate diffuse proximal disease. No other significant iliac disease. Common femoral arteries and femoral bifurcations are normally patent. Review of the MIP images confirms the above findings. NON-VASCULAR Hepatobiliary: No focal liver abnormality is seen. No gallstones, gallbladder wall thickening, or biliary dilatation. Pancreas: Unremarkable. No pancreatic ductal dilatation or surrounding inflammatory changes. Spleen: Normal in size without focal abnormality. Adrenals/Urinary Tract: Status post right nephrectomy. Left kidney demonstrates no mass or hydronephrosis. No adrenal masses identified. Stomach/Bowel: Small a moderate hiatal hernia. No evidence of bowel obstruction, ileus, lesion or inflammation. No free air identified. Lymphatic: No enlarged lymph nodes identified. Reproductive: Prostate is unremarkable. Other: Bilateral small inguinal hernias contain fat. No ascites or focal abscess identified. Musculoskeletal: Degenerative disc disease at L4-5. Review of the MIP images confirms the above findings. IMPRESSION: 1.  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 2. No evidence of thoracic aortic aneurysmal disease or dissection. 3. 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 No evidence of abdominal aortic dissection or rupture. 4. 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 5. Status post right nephrectomy. 6. Moderate hiatal hernia. 7. Bilateral small inguinal hernias containing fat. Electronically Signed   By: Aletta Edouard M.D.   On: 10/13/2018 15:38    Cardiac Studies   Left Heart Catheterization 10/10/2016:  Prox LAD to Mid LAD lesion, 100 %stenosed.  Prox RCA to Mid RCA lesion, 100 %stenosed.  Mid Cx lesion, 95 %stenosed.  Ost 2nd Mrg to 2nd Mrg lesion, 100 %stenosed.  Dist Cx lesion, 100 %stenosed.  LIMA and is normal in caliber and anatomically normal.  SVG and is normal in caliber and anatomically normal.  Seq SVG- OM2-OM3 and is normal in caliber.  Dist Graft-2 lesion, 50 %stenosed.  SVG.  Origin lesion, 100 %stenosed.  A STENT SYNERGY DES 3X28 drug eluting stent was successfully placed.  Dist Graft-1 lesion, 95 %stenosed.  Post intervention, there is a 0% residual stenosis.  Prox Graft to Mid Graft lesion, 25 %stenosed.   Severe native CAD with total occlusion of the proximal LAD at a site of prior proximal stenting after a small diagonal vessel; subtotal occlusion of the circumflex vessel prior to the OM2, with occlusion of the ostium of the OM1, and diffuse 95% mid AV groove stenosis with total distal occlusion before the distal marginal branch; and total proximal RCA occlusion.  Patent LIMA graft which supplies the mid LAD and 2 diagonal vessels.  The sequential vein graft which supplies a large OM 2 - OM3  has mild diffuse 20-30% stenosis proximally in the graft and in the mid body of the graft there is a sequential stenosis of 95%, followed by eccentric 50% narrowing with TIMI-3 flow  down both marginal branches.  Ostially occluded vein graft which has supplied a diagonal vessel.  Patent SVG to the distal RCA.  Successful PCI to the sequential SVG supplying the marginal vessels with ultimate insertion of a 3.028 mm Synergy DES stent to cover both lesions, postdilated to 3.18 mm with the 95 and 50% stenoses being reduced to 0%.  There is brisk TIMI-3 flow down to both marginal branches.  Recommendation: The patient has a history of atrial fibrillation but is in sinus rhythm today.  Eliquis will most likely be reinstituted and the patient will be on short-term triple drug therapy  with ultimate plans for Plavix/Eliquis. _______________  Echocardiogram 10/11/2016: Study Conclusions: - Left ventricle: The cavity size was normal. Wall thickness was   increased in a pattern of moderate LVH. Systolic function was   normal. The estimated ejection fraction was in the range of 55%   to 60%. Features are consistent with a pseudonormal left   ventricular filling pattern, with concomitant abnormal relaxation   and increased filling pressure (grade 2 diastolic dysfunction). - Mitral valve: Calcified annulus. Mildly thickened leaflets .   There was mild regurgitation. - Left atrium: The atrium was mildly dilated. - Impressions: Poor acoustic windows limit study.  Impressions: - Poor acoustic windows limit study.  Patient Profile   Mr. Vivona is a 73 year old male with a history of CAD s/p CABG x4 in 2010, paroxysmal atrial fibrillation, AAA, carotid artery disease, diabetes mellitus, CKD, and renal cancer s/p right nephrectomy who presented with chest pain.  Assessment & Plan    Chest Pain with Elevated Troponin - Presentation somewhat atypical with upper abdominal pain and worsening after eating; however, pain did improve with sublingual Nitro. - EKG showed no acute ischemia - Troponin minimally elevated at 0.06 >> 0.19. - Plan is for left heart catheterization today.  Will likely need to limit contrast dye during procedure due to renal function.  CAD s/p CABG  - Patient has history of CABG x4 in 2010 with subsequent PCI with DES to SVG-OM2 in 2018. - Continue aspirin, beta-blocker, and high-intensity statin/zetia. - Continue Imdur 74m daily.  Paroxysmal Atrial Fibrillation  - Continue home Toprol-XL 12.523mdaily and Multaq 40056mwice daily. - Continue chronic anticoagulation with Eliquis 5mg4mice daily.  CKD/ Renal Cancer s/p Right Nephrectomy in 2002 - Patient was gently hydrated overnight in preparation for cardiac cath today but creatinine is still 1.93 this morning. Baseline creatinine around 1.5 to 1.7.  - Will need to limit contrast dye during procedure.  Thrombocytopenia - Platelets 82 on admission but repeats have been normal (183 this morning). Possible lab error.  For questions or updates, please contact [email or web address] consult [email or web address] for contact info under        Signed, CallDarreld Mclean-C  10/15/2018, 8:08 AM     Attending Note:   The patient was seen and examined.  Agree with assessment and plan as noted above.  Changes made to the above note as needed.  Patient seen and independently examined with CallSande Rives .   We discussed all aspects of the encounter. I agree with the assessment and plan as stated above.  1.   Unstable angina / NSTEMI.   For cath today  Minimal contrast currenntly pain free   , has been getting IV NS at 50 cc / hr overnight to minimize the likelihood of contrast induced nephropthy   2.  CKD:  Creatinine is stable   3.  DM:   Plans per IM   4.  PAF:   Continue toprol XL.  eliquis is on hold for now     I have spent a total of 40 minutes with patient reviewing hospital  notes , telemetry, EKGs, labs and examining patient as well as establishing an assessment and plan that was discussed with the patient. > 50% of time was spent in direct patient care.    PhilThayer Headings.,Brooke BonitoD, FACCBethesda Hospital West/2020, 8:46 AM [address] [phone]

## 2018-10-15 NOTE — Interval H&P Note (Signed)
History and Physical Interval Note:  10/15/2018 1:34 PM  Matthew [name]  has presented today for cardiac cath with the diagnosis of nstemi.  The various methods of treatment have been discussed with the patient and family. After consideration of risks, benefits and other options for treatment, the patient has consented to  Procedure(s): LEFT HEART CATH AND CORS/GRAFTS ANGIOGRAPHY (N/A) as a surgical intervention.  The patient's history has been reviewed, patient examined, no change in status, stable for surgery.  I have reviewed the patient's chart and labs.  Questions were answered to the patient's satisfaction.    Cath Lab Visit (complete for each Cath Lab visit)  Clinical Evaluation Leading to the Procedure:   ACS: Yes.    Non-ACS:    Anginal Classification: CCS III  Anti-ischemic medical therapy: Maximal Therapy (2 or more classes of medications)  Non-Invasive Test Results: No non-invasive testing performed  Prior CABG: Previous CABG         Lauree Chandler

## 2018-10-15 NOTE — Progress Notes (Signed)
[facility]                                                                              Patient Demographics  Matthew [name], is a 73 y.o. male, DOB - Feb 27, 1946, JGO:[id number]  Admit date - 10/13/2018   Admitting Physician Mauricio Gerome Apley, MD  Outpatient Primary MD for the patient is Marda Stalker, PA-C  Outpatient specialists:   LOS - 1  days   Medical records reviewed and are as summarized below:    Chief Complaint  Patient presents with   Chest Pain       Brief summary   Patient is a 73 year old male with AAA, atrial fibrillation, CAD status post bypass graft 2011, PCI 2018, CKD, COPD, diabetes mellitus type 2, hypertension, hyperlipidemia, renal cell CA status post nephrectomy.  Patient reported new onset of chest pain for the last 3 days prior to admission, moderate.  Troponin was elevated at 0.06.  Patient was admitted for further work-up   Assessment & Plan    NSTEMI in the setting of history of CAD status post CABG and PCI -Troponin trended up to 0.19 -Continue BP control, beta-blocker, Imdur, aspirin -Cardiology was consulted, cardiac cath today, PCI placed -Cardiology recommended aspirin and Plavix for 1 month with eliquis.  Then stop aspirin and continue Plavix and Eliquis for 1 year  Dyspepsia/GERD Continue PPI twice daily, sucralfate Outpatient follow-up with PCP  Paroxysmal atrial fibrillation Continue primidone, apixaban Rate controlled  Diabetes mellitus type 2 with renal complications, CKD -Continue sliding scale insulin, Levemir  Dyslipidemia Continue statin, acetamide, fenofibrate  History of ulcerative colitis Currently stable, no flare, continue mesalamine  CKD stage III Baseline creatinine 1.8 Continue gentle hydration post-cath avoid contrast nephropathy   Code Status: Full code DVT Prophylaxis: Apixaban Family Communication: Discussed in detail with the patient, all imaging results, lab results  explained to the patient    Disposition Plan: Hopefully DC home in a.m. if cleared by cardiology  Time Spent in minutes35 minutes  Procedures:  Cardiac cath 1. 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 Chronic occlusion proximal LAD. Patent LIMA to mid LAD. Chronic occlusion of the vein graft to the Diagonal branch (not injected-known occlusion) 3. Chronic occlusion mid Circumflex. Patent sequential vein graft to OM2/OM3. Severe stenosis mid body of SVG to OM2/OM3 within the old stent 4. Successful PTCA/DES x 1 mid body of SVG to OM2/OM3 5. Chronic occlusion proximal RCA. Patent SVG to distal RCA  Recommendations: ASA and Plavix along with Eliquis for one month. Would stop ASA at one month and continue Plavix along with Eliquis for at least one year. Continue statin and beta blocker. Resume Eliquis tomorrow.   Consultants:   Cardiology  Antimicrobials:   Anti-infectives (From admission, onward)   None          Medications  Scheduled Meds:  aspirin  81 mg Oral Daily   buPROPion  150 mg Oral Daily   [START ON 10/16/2018] clopidogrel  75 mg Oral Q breakfast   dronedarone  400 mg Oral BID WC   ezetimibe  5 mg Oral QHS  fenofibrate  54 mg Oral QHS   ferrous sulfate  325 mg Oral Once per day on Tue Wed Fri   finasteride  5 mg Oral QHS   gabapentin  300 mg Oral BID   insulin aspart  0-9 Units Subcutaneous TID WC   insulin detemir  25 Units Subcutaneous BID   isosorbide mononitrate  60 mg Oral Daily   levothyroxine  175 mcg Oral QAC breakfast   magnesium oxide  400 mg Oral BID   metoprolol succinate  12.5 mg Oral Daily   rosuvastatin  20 mg Oral QHS   senna  2 tablet Oral q morning - 10a   And   senna  1 tablet Oral QHS   sodium chloride flush  3 mL Intravenous Q12H   sucralfate  1 g Oral TID WC & HS   traZODone  50 mg Oral QHS   Continuous Infusions:  sodium chloride 50 mL/hr at 10/15/18 1501   sodium chloride     PRN  Meds:.sodium chloride, acetaminophen **OR** acetaminophen, oxyCODONE **AND** acetaminophen, ALPRAZolam, hydrALAZINE, hydrOXYzine, labetalol, linaclotide, mesalamine, mesalamine, morphine injection, nitroGLYCERIN, nortriptyline, ondansetron **OR** ondansetron (ZOFRAN) IV, sodium chloride flush      Subjective:   Matthew [name] was seen and examined today.  Seen in the morning.  Upset over postponement of his cardiac cath otherwise no acute issues. Patient denies dizziness, chest pain, shortness of breath, abdominal pain, N/V/D/C, new weakness, numbess, tingling. No acute events overnight.    Objective:   Vitals:   10/15/18 1424 10/15/18 1429 10/15/18 1433 10/15/18 1438  BP: 123/73 118/75 130/78 139/85  Pulse: (!) 57 62 77 67  Resp: 16 16 10  (!) 7  Temp:      TempSrc:      SpO2: 100% 100% 99% 100%  Weight:      Height:        Intake/Output Summary (Last 24 hours) at 10/15/2018 1630 Last data filed at 10/15/2018 1234 Gross per 24 hour  Intake 0 ml  Output 800 ml  Net -800 ml     Wt Readings from Last 3 Encounters:  10/14/18 119.7 kg  08/22/18 120 kg  07/27/18 117.5 kg     Exam  General: Alert and oriented x 3, NAD  Eyes:  HEENT:  Atraumatic, normocephalic, normal oropharynx  Cardiovascular: S1 S2 auscultated,  Regular rate and rhythm.  Respiratory: Clear to auscultation bilaterally, no wheezing, rales or rhonchi  Gastrointestinal: Soft, nontender, nondistended, + bowel sounds  Ext: no pedal edema bilaterally  Neuro: No new deficits  Musculoskeletal: No digital cyanosis, clubbing  Skin: No rashes  Psych: Normal affect and demeanor, alert and oriented x3    Data Reviewed:  I have personally reviewed following labs and imaging studies  Micro Results Recent Results (from the past 240 hour(s))  SARS Coronavirus 2 [facility] order, Performed in [facility] hospital lab)     Status: None   Collection Time: 10/13/18  6:29 PM  Result Value Ref Range Status    SARS Coronavirus 2 NEGATIVE NEGATIVE Final    Comment: (NOTE) If result is NEGATIVE SARS-CoV-2 target nucleic acids are NOT DETECTED. The SARS-CoV-2 RNA is generally detectable in upper and lower  respiratory specimens during the acute phase of infection. The lowest  concentration of SARS-CoV-2 viral copies this assay can detect is 250  copies / mL. A negative result does not preclude SARS-CoV-2 infection  and should not be used as the sole basis for treatment or other  patient management decisions.  A negative result may occur with  improper specimen collection / handling, submission of specimen other  than nasopharyngeal swab, presence of viral mutation(s) within the  areas targeted by this assay, and inadequate number of viral copies  (<250 copies / mL). A negative result must be combined with clinical  observations, patient history, and epidemiological information. If result is POSITIVE SARS-CoV-2 target nucleic acids are DETECTED. The SARS-CoV-2 RNA is generally detectable in upper and lower  respiratory specimens dur ing the acute phase of infection.  Positive  results are indicative of active infection with SARS-CoV-2.  Clinical  correlation with patient history and other diagnostic information is  necessary to determine patient infection status.  Positive results do  not rule out bacterial infection or co-infection with other viruses. If result is PRESUMPTIVE POSTIVE SARS-CoV-2 nucleic acids MAY BE PRESENT.   A presumptive positive result was obtained on the submitted specimen  and confirmed on repeat testing.  While 2019 novel coronavirus  (SARS-CoV-2) nucleic acids may be present in the submitted sample  additional confirmatory testing may be necessary for epidemiological  and / or clinical management purposes  to differentiate between  SARS-CoV-2 and other Sarbecovirus currently known to infect humans.  If clinically indicated additional testing with an alternate test    methodology [phone]) is advised. The SARS-CoV-2 RNA is generally  detectable in upper and lower respiratory sp ecimens during the acute  phase of infection. The expected result is Negative. Fact Sheet for Patients:  StrictlyIdeas.no Fact Sheet for Healthcare Providers: BankingDealers.co.za This test is not yet approved or cleared by the [place] FDA and has been authorized for detection and/or diagnosis of SARS-CoV-2 by FDA under an Emergency Use Authorization (EUA).  This EUA will remain in effect (meaning this test can be used) for the duration of the COVID-19 declaration under Section 564(b)(1) of the Act, 21 U.S.C. section 360bbb-3(b)(1), unless the authorization is terminated or revoked sooner. Performed at [facility], [place] [address], [place], [place] [address]     Radiology Reports Dg Chest 2 View  Result Date: 10/13/2018 CLINICAL DATA:  Left-sided chest pain for the past 2 days. EXAM: CHEST - 2 VIEW COMPARISON:  Chest x-ray dated July 27, 2018. FINDINGS: Stable cardiomediastinal silhouette status post CABG. Normal pulmonary vascularity. Progressive linear scarring/atelectasis in the right lower lobe. No focal consolidation, pleural effusion, or pneumothorax. No acute osseous abnormality. Unchanged fractures of several sternal wires. IMPRESSION: No active cardiopulmonary disease. Electronically Signed   By: Titus Dubin M.D.   On: 10/13/2018 14:21   Ct Angio Chest/abd/pel For Dissection W And/or Wo Contrast  Result Date: 10/13/2018 CLINICAL DATA:  Chest and abdominal pain. History of abdominal aortic aneurysm. EXAM: CT ANGIOGRAPHY CHEST, ABDOMEN AND PELVIS TECHNIQUE: Multidetector CT imaging through the chest, abdomen and pelvis was performed using the standard protocol during bolus administration of intravenous contrast. Multiplanar reconstructed images and MIPs were obtained and reviewed to evaluate the vascular  anatomy. CONTRAST:  73m OMNIPAQUE IOHEXOL 350 MG/ML SOLN COMPARISON:  None. FINDINGS: CTA CHEST FINDINGS Cardiovascular: The thoracic aorta is normal in caliber and demonstrates no evidence of aneurysmal disease or dissection. Mild aortic atherosclerosis present. Proximal great vessels show no significant obstructive disease. The right subclavian artery is mildly dilated in caliber reaching maximal diameter of 13 mm. Evidence of prior CABG. The heart size is normal. No pericardial fluid identified. Central pulmonary arteries are normal in caliber. Mediastinum/Nodes: No enlarged mediastinal, hilar, or axillary lymph nodes. Thyroid gland, trachea, and  esophagus demonstrate no significant findings. Lungs/Pleura: Bronchial thickening and mild bronchiectasis present in the posterior right lower lobe with some distal mucous plugging. Some component of acute bronchitis cannot be excluded. There is no evidence of pulmonary edema, pneumothorax, nodule or pleural fluid. Musculoskeletal: No chest wall abnormality. No acute or significant osseous findings. Review of the MIP images confirms the above findings. CTA ABDOMEN AND PELVIS FINDINGS VASCULAR Aorta: There is irregular predominately noncalcified plaque throughout the abdominal aorta. At the level of the inferior mesenteric artery origin, the aorta is minimally dilated measuring approximately 3.0 x 3.2 cm. No evidence of aortic dissection or rupture. Celiac: Mild irregular plaque in the proximal celiac trunk without significant stenosis. Distal branch vessels are normally patent. SMA: Normally patent. Renals: Single left renal artery is normally patent. The right renal artery has been ligated after prior nephrectomy. IMA: Normally patent. Inflow: Origin of the right common iliac artery demonstrates 50-60% stenosis. Both internal iliac arteries demonstrate diffuse proximal disease. No other significant iliac disease. Common femoral arteries and femoral bifurcations are  normally patent. Review of the MIP images confirms the above findings. NON-VASCULAR Hepatobiliary: No focal liver abnormality is seen. No gallstones, gallbladder wall thickening, or biliary dilatation. Pancreas: Unremarkable. No pancreatic ductal dilatation or surrounding inflammatory changes. Spleen: Normal in size without focal abnormality. Adrenals/Urinary Tract: Status post right nephrectomy. Left kidney demonstrates no mass or hydronephrosis. No adrenal masses identified. Stomach/Bowel: Small a moderate hiatal hernia. No evidence of bowel obstruction, ileus, lesion or inflammation. No free air identified. Lymphatic: No enlarged lymph nodes identified. Reproductive: Prostate is unremarkable. Other: Bilateral small inguinal hernias contain fat. No ascites or focal abscess identified. Musculoskeletal: Degenerative disc disease at L4-5. Review of the MIP images confirms the above findings. IMPRESSION: 1. 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 2. No evidence of thoracic aortic aneurysmal disease or dissection. 3. 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 No evidence of abdominal aortic dissection or rupture. 4. 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 5. Status post right nephrectomy. 6. Moderate hiatal hernia. 7. Bilateral small inguinal hernias containing fat. Electronically Signed   By: Aletta Edouard M.D.   On: 10/13/2018 15:38    Lab Data:  CBC: Recent Labs  Lab 10/13/18 1343 10/14/18 0355 10/15/18 0628  WBC 7.3 8.0 7.1  NEUTROABS 4.3  --   --   HGB 13.7 13.3 12.0*  HCT 43.0 40.3 36.5*  MCV 96.4 93.3 92.6  PLT 82* 214 161   Basic Metabolic Panel: Recent Labs  Lab 10/13/18 1343 10/14/18 0355 10/15/18 0651  NA 137 139 137  K 4.7 4.1 4.1  CL 104 102 103  CO2 22 23 26   GLUCOSE 257* 262* 253*  BUN 16 14 20   CREATININE 1.89* 1.92* 1.93*  CALCIUM 8.9 9.3 9.2   GFR: Estimated Creatinine  Clearance: 45.5 mL/min (A) (by C-G formula based on SCr of 1.93 mg/dL (H)). Liver Function Tests: Recent Labs  Lab 10/13/18 1343  AST 26  ALT 19  ALKPHOS 29*  BILITOT 1.1  PROT 6.2*  ALBUMIN 3.1*   Recent Labs  Lab 10/13/18 1343  LIPASE 17   No results for input(s): AMMONIA in the last 168 hours. Coagulation Profile: No results for input(s): INR, PROTIME in the last 168 hours. Cardiac Enzymes: Recent Labs  Lab 10/13/18 1343 10/14/18 0603  TROPONINI 0.06* 0.19*   BNP (last 3 results) No results for input(s): PROBNP in the  last 8760 hours. HbA1C: No results for input(s): HGBA1C in the last 72 hours. CBG: Recent Labs  Lab 10/14/18 1249 10/14/18 1555 10/14/18 2127 10/15/18 0734 10/15/18 1057  GLUCAP 240* 286* 301* 261* 164*   Lipid Profile: No results for input(s): CHOL, HDL, LDLCALC, TRIG, CHOLHDL, LDLDIRECT in the last 72 hours. Thyroid Function Tests: No results for input(s): TSH, T4TOTAL, FREET4, T3FREE, THYROIDAB in the last 72 hours. Anemia Panel: No results for input(s): VITAMINB12, FOLATE, FERRITIN, TIBC, IRON, RETICCTPCT in the last 72 hours. Urine analysis:    Component Value Date/Time   COLORURINE YELLOW 02/27/2017 1242   APPEARANCEUR CLEAR 02/27/2017 1242   LABSPEC 1.007 02/27/2017 1242   PHURINE 5.0 02/27/2017 1242   GLUCOSEU >=500 (A) 02/27/2017 1242   HGBUR MODERATE (A) 02/27/2017 1242   BILIRUBINUR NEGATIVE 02/27/2017 1242   KETONESUR NEGATIVE 02/27/2017 1242   PROTEINUR NEGATIVE 02/27/2017 1242   UROBILINOGEN 1.0 02/16/2011 0846   NITRITE NEGATIVE 02/27/2017 1242   LEUKOCYTESUR NEGATIVE 02/27/2017 1242     Sage Hammill M.D. [facility] 10/15/2018, 4:30 PM  Pager: [phone] Between 7am to 7pm - call Pager - [phone]  After 7pm go to [email or web address] - password [email or web address]  Call night coverage person covering after 7pm

## 2018-10-15 NOTE — H&P (View-Only) (Signed)
Progress Note  Patient Name: Matthew [name] Date of Encounter: 10/15/2018  Primary Cardiologist: Larae Grooms, MD   Subjective   73 yo admitted with CP  Found to have elevated troponins c/w NSTEMI  Scheduled for cath today   Inpatient Medications    Scheduled Meds: . aspirin  81 mg Oral Daily  . buPROPion  150 mg Oral Daily  . dronedarone  400 mg Oral BID WC  . ezetimibe  5 mg Oral QHS  . fenofibrate  54 mg Oral QHS  . ferrous sulfate  325 mg Oral Once per day on Tue Wed Fri  . finasteride  5 mg Oral QHS  . gabapentin  300 mg Oral BID  . insulin aspart  0-9 Units Subcutaneous TID WC  . insulin detemir  25 Units Subcutaneous BID  . isosorbide mononitrate  60 mg Oral Daily  . levothyroxine  175 mcg Oral QAC breakfast  . magnesium oxide  400 mg Oral BID  . metoprolol succinate  12.5 mg Oral Daily  . rosuvastatin  20 mg Oral QHS  . senna  2 tablet Oral q morning - 10a   And  . senna  1 tablet Oral QHS  . sodium chloride flush  3 mL Intravenous Q12H  . sucralfate  1 g Oral TID WC & HS  . traZODone  50 mg Oral QHS   Continuous Infusions: . sodium chloride    . sodium chloride 50 mL/hr at 10/14/18 1839  . heparin 1,200 Units/hr (10/15/18 0317)   PRN Meds: sodium chloride, acetaminophen **OR** acetaminophen, oxyCODONE **AND** acetaminophen, ALPRAZolam, hydrOXYzine, linaclotide, mesalamine, mesalamine, morphine injection, nitroGLYCERIN, nortriptyline, ondansetron **OR** ondansetron (ZOFRAN) IV, sodium chloride flush   Vital Signs    Vitals:   10/14/18 0359 10/14/18 1433 10/14/18 2027 10/15/18 0646  BP: 94/71 (!) 71/39 125/73 (!) 106/58  Pulse: 70 (!) 57 (!) 58 62  Resp: 18 18 18 18   Temp: (!) 97.4 F (36.3 C) 98.3 F (36.8 C) 98.1 F (36.7 C) 97.9 F (36.6 C)  TempSrc: Oral Oral Oral Oral  SpO2: 97% 95% 100% 95%  Weight: 119.7 kg     Height:        Intake/Output Summary (Last 24 hours) at 10/15/2018 0808 Last data filed at 10/15/2018 0600 Gross per 24  hour  Intake -  Output 400 ml  Net -400 ml   Last 3 Weights 10/14/2018 10/13/2018 10/13/2018  Weight (lbs) 263 lb 14.3 oz 263 lb 12.8 oz 260 lb  Weight (kg) 119.7 kg 119.659 kg 117.935 kg      Telemetry     NSR  - Personally Reviewed  ECG    No new ECG tracings today. - Personally Reviewed  Physical Exam   Physical Exam per MD:    Physical Exam: Blood pressure (!) 96/50, pulse 62, temperature 97.9 F (36.6 C), temperature source Oral, resp. rate 18, height 6' 1"  (1.854 m), weight 119.7 kg, SpO2 95 %.  GEN:   Chronically ill-appearing gentleman.  Moderately obese. HEENT: Normal NECK: No JVD; No carotid bruits LYMPHATICS: No lymphadenopathy CARDIAC: Regular rate S1-S2. RESPIRATORY:  Clear to auscultation without rales, wheezing or rhonchi  ABDOMEN: Moderately obese. MUSCULOSKELETAL: Good pulses.  No edema. SKIN: Warm and dry NEUROLOGIC:  Alert and oriented x 3   Labs    Chemistry Recent Labs  Lab 10/13/18 1343 10/14/18 0355 10/15/18 0651  NA 137 139 137  K 4.7 4.1 4.1  CL 104 102 103  CO2 22 23 26  GLUCOSE 257* 262* 253*  BUN 16 14 20   CREATININE 1.89* 1.92* 1.93*  CALCIUM 8.9 9.3 9.2  PROT 6.2*  --   --   ALBUMIN 3.1*  --   --   AST 26  --   --   ALT 19  --   --   ALKPHOS 29*  --   --   BILITOT 1.1  --   --   GFRNONAA 34* 34* 33*  GFRAA 40* 39* 39*  ANIONGAP 11 14 8      Hematology Recent Labs  Lab 10/13/18 1343 10/14/18 0355 10/15/18 0628  WBC 7.3 8.0 7.1  RBC 4.46 4.32 3.94*  HGB 13.7 13.3 12.0*  HCT 43.0 40.3 36.5*  MCV 96.4 93.3 92.6  MCH 30.7 30.8 30.5  MCHC 31.9 33.0 32.9  RDW 13.4 13.5 13.3  PLT 82* 214 183    Cardiac Enzymes Recent Labs  Lab 10/13/18 1343 10/14/18 0603  TROPONINI 0.06* 0.19*   No results for input(s): TROPIPOC in the last 168 hours.   BNPNo results for input(s): BNP, PROBNP in the last 168 hours.   DDimer No results for input(s): DDIMER in the last 168 hours.   Radiology    Dg Chest 2 View  Result Date:  10/13/2018 CLINICAL DATA:  Left-sided chest pain for the past 2 days. EXAM: CHEST - 2 VIEW COMPARISON:  Chest x-ray dated July 27, 2018. FINDINGS: Stable cardiomediastinal silhouette status post CABG. Normal pulmonary vascularity. Progressive linear scarring/atelectasis in the right lower lobe. No focal consolidation, pleural effusion, or pneumothorax. No acute osseous abnormality. Unchanged fractures of several sternal wires. IMPRESSION: No active cardiopulmonary disease. Electronically Signed   By: Titus Dubin M.D.   On: 10/13/2018 14:21   Ct Angio Chest/abd/pel For Dissection W And/or Wo Contrast  Result Date: 10/13/2018 CLINICAL DATA:  Chest and abdominal pain. History of abdominal aortic aneurysm. EXAM: CT ANGIOGRAPHY CHEST, ABDOMEN AND PELVIS TECHNIQUE: Multidetector CT imaging through the chest, abdomen and pelvis was performed using the standard protocol during bolus administration of intravenous contrast. Multiplanar reconstructed images and MIPs were obtained and reviewed to evaluate the vascular anatomy. CONTRAST:  44m OMNIPAQUE IOHEXOL 350 MG/ML SOLN COMPARISON:  None. FINDINGS: CTA CHEST FINDINGS Cardiovascular: The thoracic aorta is normal in caliber and demonstrates no evidence of aneurysmal disease or dissection. Mild aortic atherosclerosis present. Proximal great vessels show no significant obstructive disease. The right subclavian artery is mildly dilated in caliber reaching maximal diameter of 13 mm. Evidence of prior CABG. The heart size is normal. No pericardial fluid identified. Central pulmonary arteries are normal in caliber. Mediastinum/Nodes: No enlarged mediastinal, hilar, or axillary lymph nodes. Thyroid gland, trachea, and esophagus demonstrate no significant findings. Lungs/Pleura: Bronchial thickening and mild bronchiectasis present in the posterior right lower lobe with some distal mucous plugging. Some component of acute bronchitis cannot be excluded. There is no evidence  of pulmonary edema, pneumothorax, nodule or pleural fluid. Musculoskeletal: No chest wall abnormality. No acute or significant osseous findings. Review of the MIP images confirms the above findings. CTA ABDOMEN AND PELVIS FINDINGS VASCULAR Aorta: There is irregular predominately noncalcified plaque throughout the abdominal aorta. At the level of the inferior mesenteric artery origin, the aorta is minimally dilated measuring approximately 3.0 x 3.2 cm. No evidence of aortic dissection or rupture. Celiac: Mild irregular plaque in the proximal celiac trunk without significant stenosis. Distal branch vessels are normally patent. SMA: Normally patent. Renals: Single left renal artery is normally patent. The right renal artery has  been ligated after prior nephrectomy. IMA: Normally patent. Inflow: Origin of the right common iliac artery demonstrates 50-60% stenosis. Both internal iliac arteries demonstrate diffuse proximal disease. No other significant iliac disease. Common femoral arteries and femoral bifurcations are normally patent. Review of the MIP images confirms the above findings. NON-VASCULAR Hepatobiliary: No focal liver abnormality is seen. No gallstones, gallbladder wall thickening, or biliary dilatation. Pancreas: Unremarkable. No pancreatic ductal dilatation or surrounding inflammatory changes. Spleen: Normal in size without focal abnormality. Adrenals/Urinary Tract: Status post right nephrectomy. Left kidney demonstrates no mass or hydronephrosis. No adrenal masses identified. Stomach/Bowel: Small a moderate hiatal hernia. No evidence of bowel obstruction, ileus, lesion or inflammation. No free air identified. Lymphatic: No enlarged lymph nodes identified. Reproductive: Prostate is unremarkable. Other: Bilateral small inguinal hernias contain fat. No ascites or focal abscess identified. Musculoskeletal: Degenerative disc disease at L4-5. Review of the MIP images confirms the above findings. IMPRESSION: 1.  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 2. No evidence of thoracic aortic aneurysmal disease or dissection. 3. 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 No evidence of abdominal aortic dissection or rupture. 4. 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 5. Status post right nephrectomy. 6. Moderate hiatal hernia. 7. Bilateral small inguinal hernias containing fat. Electronically Signed   By: Aletta Edouard M.D.   On: 10/13/2018 15:38    Cardiac Studies   Left Heart Catheterization 10/10/2016:  Prox LAD to Mid LAD lesion, 100 %stenosed.  Prox RCA to Mid RCA lesion, 100 %stenosed.  Mid Cx lesion, 95 %stenosed.  Ost 2nd Mrg to 2nd Mrg lesion, 100 %stenosed.  Dist Cx lesion, 100 %stenosed.  LIMA and is normal in caliber and anatomically normal.  SVG and is normal in caliber and anatomically normal.  Seq SVG- OM2-OM3 and is normal in caliber.  Dist Graft-2 lesion, 50 %stenosed.  SVG.  Origin lesion, 100 %stenosed.  A STENT SYNERGY DES 3X28 drug eluting stent was successfully placed.  Dist Graft-1 lesion, 95 %stenosed.  Post intervention, there is a 0% residual stenosis.  Prox Graft to Mid Graft lesion, 25 %stenosed.   Severe native CAD with total occlusion of the proximal LAD at a site of prior proximal stenting after a small diagonal vessel; subtotal occlusion of the circumflex vessel prior to the OM2, with occlusion of the ostium of the OM1, and diffuse 95% mid AV groove stenosis with total distal occlusion before the distal marginal branch; and total proximal RCA occlusion.  Patent LIMA graft which supplies the mid LAD and 2 diagonal vessels.  The sequential vein graft which supplies a large OM 2 - OM3  has mild diffuse 20-30% stenosis proximally in the graft and in the mid body of the graft there is a sequential stenosis of 95%, followed by eccentric 50% narrowing with TIMI-3 flow  down both marginal branches.  Ostially occluded vein graft which has supplied a diagonal vessel.  Patent SVG to the distal RCA.  Successful PCI to the sequential SVG supplying the marginal vessels with ultimate insertion of a 3.028 mm Synergy DES stent to cover both lesions, postdilated to 3.18 mm with the 95 and 50% stenoses being reduced to 0%.  There is brisk TIMI-3 flow down to both marginal branches.  Recommendation: The patient has a history of atrial fibrillation but is in sinus rhythm today.  Eliquis will most likely be reinstituted and the patient will be on short-term triple drug therapy  with ultimate plans for Plavix/Eliquis. _______________  Echocardiogram 10/11/2016: Study Conclusions: - Left ventricle: The cavity size was normal. Wall thickness was   increased in a pattern of moderate LVH. Systolic function was   normal. The estimated ejection fraction was in the range of 55%   to 60%. Features are consistent with a pseudonormal left   ventricular filling pattern, with concomitant abnormal relaxation   and increased filling pressure (grade 2 diastolic dysfunction). - Mitral valve: Calcified annulus. Mildly thickened leaflets .   There was mild regurgitation. - Left atrium: The atrium was mildly dilated. - Impressions: Poor acoustic windows limit study.  Impressions: - Poor acoustic windows limit study.  Patient Profile   Matthew [name] is a 73 year old male with a history of CAD s/p CABG x4 in 2010, paroxysmal atrial fibrillation, AAA, carotid artery disease, diabetes mellitus, CKD, and renal cancer s/p right nephrectomy who presented with chest pain.  Assessment & Plan    Chest Pain with Elevated Troponin - Presentation somewhat atypical with upper abdominal pain and worsening after eating; however, pain did improve with sublingual Nitro. - EKG showed no acute ischemia - Troponin minimally elevated at 0.06 >> 0.19. - Plan is for left heart catheterization today.  Will likely need to limit contrast dye during procedure due to renal function.  CAD s/p CABG  - Patient has history of CABG x4 in 2010 with subsequent PCI with DES to SVG-OM2 in 2018. - Continue aspirin, beta-blocker, and high-intensity statin/zetia. - Continue Imdur 65m daily.  Paroxysmal Atrial Fibrillation  - Continue home Toprol-XL 12.526mdaily and Multaq 40070mwice daily. - Continue chronic anticoagulation with Eliquis 5mg67mice daily.  CKD/ Renal Cancer s/p Right Nephrectomy in 2002 - Patient was gently hydrated overnight in preparation for cardiac cath today but creatinine is still 1.93 this morning. Baseline creatinine around 1.5 to 1.7.  - Will need to limit contrast dye during procedure.  Thrombocytopenia - Platelets 82 on admission but repeats have been normal (183 this morning). Possible lab error.  For questions or updates, please contact [facility] consult [email or web address] for contact info under        Signed, CallDarreld Mclean-C  10/15/2018, 8:08 AM     Attending Note:   The patient was seen and examined.  Agree with assessment and plan as noted above.  Changes made to the above note as needed.  Patient seen and independently examined with CallSande Rives .   We discussed all aspects of the encounter. I agree with the assessment and plan as stated above.  1.   Unstable angina / NSTEMI.   For cath today  Minimal contrast currenntly pain free   , has been getting IV NS at 50 cc / hr overnight to minimize the likelihood of contrast induced nephropthy   2.  CKD:  Creatinine is stable   3.  DM:   Plans per IM   4.  PAF:   Continue toprol XL.  eliquis is on hold for now     I have spent a total of 40 minutes with patient reviewing hospital  notes , telemetry, EKGs, labs and examining patient as well as establishing an assessment and plan that was discussed with the patient. > 50% of time was spent in direct patient care.    PhilThayer Headings.,Brooke BonitoD, FACCMartin County Hospital District/2020, 8:46 AM [address]Carolina Shoreser [phone]

## 2018-10-15 NOTE — Progress Notes (Signed)
Inpatient Diabetes Program Recommendations  AACE/ADA: New Consensus Statement on Inpatient Glycemic Control (2015)  Target Ranges:  Prepandial:   less than 140 mg/dL      Peak postprandial:   less than 180 mg/dL (1-2 hours)      Critically ill patients:  140 - 180 mg/dL   Lab Results  Component Value Date   GLUCAP 164 (H) 10/15/2018   HGBA1C 8.6 (H) 05/09/2017    Review of Glycemic Control Results for HAYK, DIVIS (MRN [id number]) as of 10/15/2018 11:21  Ref. Range 10/14/2018 08:34 10/14/2018 12:49 10/14/2018 15:55 10/14/2018 21:27 10/15/2018 07:34  Glucose-Capillary Latest Ref Range: 70 - 99 mg/dL 197 (H) 240 (H) 286 (H) 301 (H) 261 (H)   Diabetes history: DM 2 Outpatient Diabetes medications: Levemir 50 units bid, Novolog 10-40 units lunch and dinner  Current orders for Inpatient glycemic control: Levemir 25 units bid, Novolog 0-9 units tid  Inpatient Diabetes Program Recommendations:    Fasting glucose 261 mg/dl. Glucose increases after meals into the 300's.   Patient currently NPO. Once patient is eating consider Novolog 4 units tid if patient is consuming at least 50% of meals and glucose is at least 80 mg/dl.  May also need to increase basal insulin tomorrow if fasting glucose remains elevated.  Thanks,  Tama Headings RN, MSN, BC-ADM Inpatient Diabetes Coordinator Team Pager [phone] (8a-5p)

## 2018-10-15 NOTE — Progress Notes (Signed)
ANTICOAGULATION CONSULT NOTE - Follow-Up Consult  Pharmacy Consult for heparin Indication: chest pain/ACS  Patient Measurements: Height: 6' 1"  (185.4 cm) Weight: 263 lb 14.3 oz (119.7 kg) IBW/kg (Calculated) : 79.9 Heparin Dosing Weight:105 kg  Vital Signs: Temp: 97.9 F (36.6 C) (05/06 0646) Temp Source: Oral (05/06 0646) BP: 96/50 (05/06 0813) Pulse Rate: 62 (05/06 0646)  Labs: Recent Labs    10/13/18 1343 10/14/18 0355 10/14/18 0603  10/14/18 1947 10/14/18 2112 10/15/18 0628 10/15/18 0651  HGB 13.7 13.3  --   --   --   --  12.0*  --   HCT 43.0 40.3  --   --   --   --  36.5*  --   PLT 82* 214  --   --   --   --  183  --   APTT  --   --   --    < > 135* 108* 77*  --   HEPARINUNFRC  --   --   --   --   --   --  1.04*  --   CREATININE 1.89* 1.92*  --   --   --   --   --  1.93*  TROPONINI 0.06*  --  0.19*  --   --   --   --   --    < > = values in this interval not displayed.     Assessment: Admitted with chest pain. Troponins have trended up. Mr. Wyszynski is on Eliquis prior to admission for AFib. His last dose was yesterday evening. Will transition patient to heparin infusion for ACS in anticipation of cardiac cath today. H/h plts ok. SCr 1.5 > 1.9.  APTT is WNL this morning. Heparin level is elevated as expected given Eliquis prior to admission.  Goal of Therapy:  Heparin level 0.3-0.7 units/ml Monitor platelets by anticoagulation protocol: Yes    Plan:  -Continue heparin at 1200 units/hr -Daily HL, CBC -F/u after cath    Harvel Quale 10/15/2018 9:59 AM

## 2018-10-16 ENCOUNTER — Encounter (HOSPITAL_COMMUNITY): Payer: Self-pay | Admitting: Cardiovascular Disease

## 2018-10-16 ENCOUNTER — Telehealth: Payer: Self-pay | Admitting: Student

## 2018-10-16 DIAGNOSIS — I25118 Atherosclerotic heart disease of native coronary artery with other forms of angina pectoris: Secondary | ICD-10-CM

## 2018-10-16 LAB — CBC
HCT: 37.6 % — ABNORMAL LOW (ref 39.0–52.0)
Hemoglobin: 12.3 g/dL — ABNORMAL LOW (ref 13.0–17.0)
MCH: 30.4 pg (ref 26.0–34.0)
MCHC: 32.7 g/dL (ref 30.0–36.0)
MCV: 93.1 fL (ref 80.0–100.0)
Platelets: 188 10*3/uL (ref 150–400)
RBC: 4.04 MIL/uL — ABNORMAL LOW (ref 4.22–5.81)
RDW: 13.6 % (ref 11.5–15.5)
WBC: 6.8 10*3/uL (ref 4.0–10.5)
nRBC: 0 % (ref 0.0–0.2)

## 2018-10-16 LAB — BASIC METABOLIC PANEL
Anion gap: 11 (ref 5–15)
BUN: 16 mg/dL (ref 8–23)
CO2: 25 mmol/L (ref 22–32)
Calcium: 8.7 mg/dL — ABNORMAL LOW (ref 8.9–10.3)
Chloride: 99 mmol/L (ref 98–111)
Creatinine, Ser: 1.83 mg/dL — ABNORMAL HIGH (ref 0.61–1.24)
GFR calc Af Amer: 41 mL/min — ABNORMAL LOW (ref >60)
GFR calc non Af Amer: 36 mL/min — ABNORMAL LOW (ref >60)
Glucose, Bld: 203 mg/dL — ABNORMAL HIGH (ref 70–99)
Potassium: 4.2 mmol/L (ref 3.5–5.1)
Sodium: 135 mmol/L (ref 135–145)

## 2018-10-16 LAB — GLUCOSE, CAPILLARY: Glucose-Capillary: 193 mg/dL — ABNORMAL HIGH (ref 70–99)

## 2018-10-16 MED ORDER — APIXABAN 5 MG PO TABS: 5.0000 mg | ORAL_TABLET | Freq: Two times a day (BID) | ORAL | Status: DC

## 2018-10-16 MED ORDER — SUCRALFATE 1 GM/10ML PO SUSP: 1.0000 g | Freq: Three times a day (TID) | ORAL | 0 refills | Status: DC

## 2018-10-16 MED ORDER — CLOPIDOGREL BISULFATE 75 MG PO TABS: 75.0000 mg | ORAL_TABLET | Freq: Every day | ORAL | 11 refills | Status: AC

## 2018-10-16 MED ORDER — ASPIRIN 81 MG PO CHEW: 81.0000 mg | CHEWABLE_TABLET | Freq: Every day | ORAL | 0 refills | Status: AC

## 2018-10-16 NOTE — Discharge Summary (Signed)
Physician Discharge Summary   Patient ID: Matthew [name] MRN: [id number] DOB/AGE: 75-Aug-1947 75 y.o.  Admit date: 10/13/2018 Discharge date: 10/16/2018  Primary Care Physician:  Marda Stalker, PA-C   Recommendations for Outpatient Follow-up:  1. Follow up with PCP in 1-2 weeks 2. Per cardiology recommendations, patient was advised aspirin, Plavix, Eliquis status post stent.  Patient will continue aspirin for 1 month and then stop.  Subsequently he will continue Plavix and Eliquis for 1 year.  Home Health: None  Equipment/Devices:   Discharge Condition: stable CODE STATUS: FULL  Diet recommendation:    Discharge Diagnoses:    . NSTEMI in the setting of CAD . Hypertension . CAD (coronary artery disease) . Renal cell carcinoma ([place]) . Hypothyroidism . PAF (paroxysmal atrial fibrillation) ([place]) . CKD (chronic kidney disease), stage III ([place]) . AAA (abdominal aortic aneurysm) ([place]) . Ischemic chest pain ([place]) Acute kidney injury on CKD stage III Dyspepsia/GERD  Consults: Cardiology    Allergies:   Allergies  Allergen Reactions  . Amiodarone Other (See Comments)    Caused lung and liver toxicity  . Tetanus Toxoids Hives     DISCHARGE MEDICATIONS: Allergies as of 10/16/2018      Reactions   Amiodarone Other (See Comments)   Caused lung and liver toxicity   Tetanus Toxoids Hives      Medication List    TAKE these medications   ALPRAZolam 0.25 MG tablet Commonly known as:  Xanax Take 1 tablet (0.25 mg total) by mouth at bedtime as needed for anxiety. What changed:  reasons to take this   aspirin 81 MG chewable tablet Chew 1 tablet (81 mg total) by mouth daily for 30 days. Please take for 1 month, then stop What changed:  additional instructions   buPROPion 150 MG 24 hr tablet Commonly known as:  WELLBUTRIN XL Take 150 mg by mouth daily.   capsaicin 0.025 % cream Commonly known as:  ZOSTRIX Apply 1 application topically 2 (two) times daily as  needed (for pain).   clopidogrel 75 MG tablet Commonly known as:  PLAVIX Take 1 tablet (75 mg total) by mouth daily with breakfast.   clotrimazole 1 % cream Commonly known as:  LOTRIMIN Apply 1 application topically 2 (two) times daily as needed (for rash).   D3-1000 25 MCG (1000 UT) tablet Generic drug:  Cholecalciferol Take 1,000 Units by mouth at bedtime.   dronedarone 400 MG tablet Commonly known as:  MULTAQ Take 400 mg by mouth 2 (two) times daily with a meal.   Eliquis 5 MG Tabs tablet Generic drug:  apixaban Take 5 mg by mouth 2 (two) times daily.   ezetimibe 10 MG tablet Commonly known as:  ZETIA Take 5 mg by mouth at bedtime.   fenofibrate 54 MG tablet Take 54 mg by mouth at bedtime.   ferrous sulfate 325 (65 FE) MG tablet Take 325 mg by mouth 3 (three) times a week. Tuesday, Wednesday and Friday   finasteride 5 MG tablet Commonly known as:  PROSCAR Take 5 mg by mouth at bedtime.   Fish Oil 1000 MG Caps Take 3,000 mg by mouth 2 (two) times daily.   gabapentin 300 MG capsule Commonly known as:  NEURONTIN Take 300 mg by mouth 2 (two) times daily.   glucose 4 GM chewable tablet Chew 1 tablet by mouth as needed for low blood sugar.   hydrOXYzine 10 MG tablet Commonly known as:  ATARAX/VISTARIL Take 10 mg by mouth daily as needed for itching.  ibuprofen 200 MG tablet Commonly known as:  ADVIL Take 200 mg by mouth every 8 (eight) hours as needed (for gout pain).   isosorbide mononitrate 60 MG 24 hr tablet Commonly known as:  IMDUR Take 1 tablet (60 mg total) by mouth daily. Hold if blood pressure <100   ketoconazole 2 % cream Commonly known as:  NIZORAL Apply 1 application topically daily as needed for irritation (or rashes).   Levemir FlexPen 100 UNIT/ML Pen Generic drug:  Insulin Detemir Inject 50 Units into the skin 2 (two) times daily.   levothyroxine 175 MCG tablet Commonly known as:  SYNTHROID Take 175 mcg by mouth daily before  breakfast.   LIDOCAINE EX Apply 1 application topically 2 (two) times daily as needed (for pain).   Linzess 290 MCG Caps capsule Generic drug:  linaclotide Take 290 mcg by mouth daily as needed (for constipation).   Magnesium Oxide 420 MG Tabs Take 420 mg by mouth 2 (two) times a day.   MENTHOL-METHYL SALICYLATE EX Apply 1 application topically as needed (pain).   mesalamine 1000 MG suppository Commonly known as:  CANASA Place 1,000 mg rectally at bedtime as needed (ulcerative colitis flares).   mesalamine 1.2 g EC tablet Commonly known as:  LIALDA Take 1.2 g by mouth daily as needed (ulcerative colitis flares).   methylPREDNISolone 4 MG tablet Commonly known as:  MEDROL Take 4 mg by mouth 2 (two) times daily as needed (gout flares).   metoprolol succinate 25 MG 24 hr tablet Commonly known as:  TOPROL-XL Take 12.5 mg by mouth daily.   mupirocin ointment 2 % Commonly known as:  BACTROBAN Place 1 application into the nose daily as needed (for mersa).   neomycin-bacitracin-polymyxin ointment Commonly known as:  NEOSPORIN Apply 1 application topically as needed for wound care.   nitroGLYCERIN 0.4 MG SL tablet Commonly known as:  NITROSTAT Place 0.4 mg under the tongue every 5 (five) minutes x 3 doses as needed for chest pain.   nortriptyline 25 MG capsule Commonly known as:  PAMELOR Take 25 mg by mouth at bedtime as needed for headache.   NovoLOG FlexPen 100 UNIT/ML FlexPen Generic drug:  insulin aspart Inject 10-40 Units into the skin 2 (two) times daily after a meal. Lunch and Dinner(evening meal)   nystatin cream Commonly known as:  MYCOSTATIN Apply 1 application topically daily as needed for dry skin (or rash).   OVER THE COUNTER MEDICATION "[place]" chewable for acid reflux= Chew 1-2 tablets by mouth after meals or at bedtime as needed for indigestion   oxyCODONE-acetaminophen 10-325 MG tablet Commonly known as:  PERCOCET Take 1 tablet by mouth every 6 (six)  hours as needed for pain.   RABEprazole 20 MG tablet Commonly known as:  ACIPHEX Take 40 mg by mouth 2 (two) times daily before a meal.   rosuvastatin 40 MG tablet Commonly known as:  CRESTOR Take 20 mg by mouth at bedtime.   senna 8.6 MG Tabs tablet Commonly known as:  SENOKOT Take 1-2 tablets by mouth See admin instructions. Take 2 tablets by mouth in the morning and 1 tablet at bedtime   silver sulfADIAZINE 1 % cream Commonly known as:  SILVADENE Apply 1 application topically daily as needed (skin care).   sucralfate 1 GM/10ML suspension Commonly known as:  CARAFATE Take 10 mLs (1 g total) by mouth 4 (four) times daily -  with meals and at bedtime for 30 days.   terbinafine 1 % cream Commonly known as:  LAMISIL Apply 1 application topically daily as needed (skin care).   traZODone 50 MG tablet Commonly known as:  DESYREL Take 1 tablet (50 mg total) by mouth at bedtime.   triamcinolone cream 0.1 % Commonly known as:  KENALOG Apply 1 application topically daily as needed (rash).   urea 10 % lotion Apply 1 application topically as needed for dry skin.        Brief H and P: For complete details please refer to admission H and P, but in brief Patient is a 73 year old male with AAA, atrial fibrillation, CAD status post bypass graft 2011, PCI 2018, CKD, COPD, diabetes mellitus type 2, hypertension, hyperlipidemia, renal cell CA status post nephrectomy.  Patient reported new onset of chest pain for the last 3 days prior to admission, moderate.  Troponin was elevated at 0.06.  Patient was admitted for further work-up  Hospital Course:   NSTEMI in the setting of history of CAD status post CABG and PCI -Troponin trended up to 0.19 -Continue BP control, beta-blocker, Imdur, aspirin -Cardiology was consulted, patient underwent cardiac cath with stenting of the SVG to OM 2/OM 3 -Cardiology recommended aspirin and Plavix for 1 month with eliquis.  Then stop aspirin and continue  Plavix and Eliquis for 1 year -Currently doing well, cleared by cardiology to be discharged home.  Note for the chest pain.  Dyspepsia/GERD Continue PPI twice daily, sucralfate added for 1 month Outpatient follow-up with PCP  Paroxysmal atrial fibrillation Continue primidone, apixaban Rate controlled  Diabetes mellitus type 2 with renal complications, CKD -Patient was placed on sliding scale insulin and Levemir while inpatient.  He will continue his outpatient regimen of insulin.  Dyslipidemia Continue statin, acetamide, fenofibrate  History of ulcerative colitis Currently stable, no flare, continue mesalamine  Mild AKI on CKD stage III Baseline creatinine 1.8 Post-cath, creatinine 1.9.  Patient was placed on gentle hydration, creatinine back to baseline    Day of Discharge S: No chest pain or shortness of breath, wants to go home cleared by cardiology.  BP 111/72 (BP Location: Right Arm)   Pulse (!) 59   Temp (!) 97 F (36.1 C) (Oral)   Resp 17   Ht 6' 1"  (1.854 m)   Wt 119.7 kg   SpO2 97%   BMI 34.82 kg/m   Physical Exam: General: Alert and awake oriented x3 not in any acute distress. HEENT: anicteric sclera, pupils reactive to light and accommodation CVS: S1-S2 clear no murmur rubs or gallops Chest: clear to auscultation bilaterally, no wheezing rales or rhonchi Abdomen: soft nontender, nondistended, normal bowel sounds Extremities: no cyanosis, clubbing or edema noted bilaterally Neuro: Cranial nerves II-XII intact, no focal neurological deficits   The results of significant diagnostics from this hospitalization (including imaging, microbiology, ancillary and laboratory) are listed below for reference.      Procedures/Studies:  Cardiac cath 10/15/2018 1. 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 Chronic occlusion proximal LAD. Patent LIMA to mid LAD. Chronic occlusion of the vein graft to the Diagonal branch (not injected-known  occlusion) 3. Chronic occlusion mid Circumflex. Patent sequential vein graft to OM2/OM3. Severe stenosis mid body of SVG to OM2/OM3 within the old stent 4. Successful PTCA/DES x 1 mid body of SVG to OM2/OM3 5. Chronic occlusion proximal RCA. Patent SVG to distal RCA  Recommendations: ASA and Plavix along with Eliquis for one month. Would stop ASA at one month and continue Plavix along with Eliquis for at least one year.  Continue statin and beta blocker. Resume Eliquis tomorrow.  Dg Chest 2 View  Result Date: 10/13/2018 CLINICAL DATA:  Left-sided chest pain for the past 2 days. EXAM: CHEST - 2 VIEW COMPARISON:  Chest x-ray dated July 27, 2018. FINDINGS: Stable cardiomediastinal silhouette status post CABG. Normal pulmonary vascularity. Progressive linear scarring/atelectasis in the right lower lobe. No focal consolidation, pleural effusion, or pneumothorax. No acute osseous abnormality. Unchanged fractures of several sternal wires. IMPRESSION: No active cardiopulmonary disease. Electronically Signed   By: Titus Dubin M.D.   On: 10/13/2018 14:21   Ct Angio Chest/abd/pel For Dissection W And/or Wo Contrast  Result Date: 10/13/2018 CLINICAL DATA:  Chest and abdominal pain. History of abdominal aortic aneurysm. EXAM: CT ANGIOGRAPHY CHEST, ABDOMEN AND PELVIS TECHNIQUE: Multidetector CT imaging through the chest, abdomen and pelvis was performed using the standard protocol during bolus administration of intravenous contrast. Multiplanar reconstructed images and MIPs were obtained and reviewed to evaluate the vascular anatomy. CONTRAST:  32m OMNIPAQUE IOHEXOL 350 MG/ML SOLN COMPARISON:  None. FINDINGS: CTA CHEST FINDINGS Cardiovascular: The thoracic aorta is normal in caliber and demonstrates no evidence of aneurysmal disease or dissection. Mild aortic atherosclerosis present. Proximal great vessels show no significant obstructive disease. The right subclavian artery is mildly dilated in caliber  reaching maximal diameter of 13 mm. Evidence of prior CABG. The heart size is normal. No pericardial fluid identified. Central pulmonary arteries are normal in caliber. Mediastinum/Nodes: No enlarged mediastinal, hilar, or axillary lymph nodes. Thyroid gland, trachea, and esophagus demonstrate no significant findings. Lungs/Pleura: Bronchial thickening and mild bronchiectasis present in the posterior right lower lobe with some distal mucous plugging. Some component of acute bronchitis cannot be excluded. There is no evidence of pulmonary edema, pneumothorax, nodule or pleural fluid. Musculoskeletal: No chest wall abnormality. No acute or significant osseous findings. Review of the MIP images confirms the above findings. CTA ABDOMEN AND PELVIS FINDINGS VASCULAR Aorta: There is irregular predominately noncalcified plaque throughout the abdominal aorta. At the level of the inferior mesenteric artery origin, the aorta is minimally dilated measuring approximately 3.0 x 3.2 cm. No evidence of aortic dissection or rupture. Celiac: Mild irregular plaque in the proximal celiac trunk without significant stenosis. Distal branch vessels are normally patent. SMA: Normally patent. Renals: Single left renal artery is normally patent. The right renal artery has been ligated after prior nephrectomy. IMA: Normally patent. Inflow: Origin of the right common iliac artery demonstrates 50-60% stenosis. Both internal iliac arteries demonstrate diffuse proximal disease. No other significant iliac disease. Common femoral arteries and femoral bifurcations are normally patent. Review of the MIP images confirms the above findings. NON-VASCULAR Hepatobiliary: No focal liver abnormality is seen. No gallstones, gallbladder wall thickening, or biliary dilatation. Pancreas: Unremarkable. No pancreatic ductal dilatation or surrounding inflammatory changes. Spleen: Normal in size without focal abnormality. Adrenals/Urinary Tract: Status post right  nephrectomy. Left kidney demonstrates no mass or hydronephrosis. No adrenal masses identified. Stomach/Bowel: Small a moderate hiatal hernia. No evidence of bowel obstruction, ileus, lesion or inflammation. No free air identified. Lymphatic: No enlarged lymph nodes identified. Reproductive: Prostate is unremarkable. Other: Bilateral small inguinal hernias contain fat. No ascites or focal abscess identified. Musculoskeletal: Degenerative disc disease at L4-5. Review of the MIP images confirms the above findings. IMPRESSION: 1. 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 2. No evidence of thoracic aortic aneurysmal disease or dissection. 3. 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 No evidence of abdominal aortic dissection  or rupture. 4. 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 5. Status post right nephrectomy. 6. Moderate hiatal hernia. 7. Bilateral small inguinal hernias containing fat. Electronically Signed   By: Aletta Edouard M.D.   On: 10/13/2018 15:38       LAB RESULTS: Basic Metabolic Panel: Recent Labs  Lab 10/15/18 0651 10/16/18 0227  NA 137 135  K 4.1 4.2  CL 103 99  CO2 26 25  GLUCOSE 253* 203*  BUN 20 16  CREATININE 1.93* 1.83*  CALCIUM 9.2 8.7*   Liver Function Tests: Recent Labs  Lab 10/13/18 1343  AST 26  ALT 19  ALKPHOS 29*  BILITOT 1.1  PROT 6.2*  ALBUMIN 3.1*   Recent Labs  Lab 10/13/18 1343  LIPASE 17   No results for input(s): AMMONIA in the last 168 hours. CBC: Recent Labs  Lab 10/13/18 1343  10/15/18 0628 10/16/18 0227  WBC 7.3   < > 7.1 6.8  NEUTROABS 4.3  --   --   --   HGB 13.7   < > 12.0* 12.3*  HCT 43.0   < > 36.5* 37.6*  MCV 96.4   < > 92.6 93.1  PLT 82*   < > 183 188   < > = values in this interval not displayed.   Cardiac Enzymes: Recent Labs  Lab 10/13/18 1343 10/14/18 0603  TROPONINI 0.06* 0.19*   BNP: Invalid input(s):  POCBNP CBG: Recent Labs  Lab 10/15/18 2218 10/16/18 0741  GLUCAP 197* 193*      Disposition and Follow-up: Discharge Instructions    Amb Referral to Cardiac Rehabilitation   Complete by:  As directed    Diagnosis:   NSTEMI Coronary Stents PTCA     Diet Carb Modified   Complete by:  As directed    Increase activity slowly   Complete by:  As directed        DISPOSITION: [place]    Marda Stalker, PA-C. Schedule an appointment as soon as possible for a visit in 2 week(s).   Specialty:  Family Medicine Why:  or tele visit  Contact information: [place] [address] [phone]        Jettie Booze, MD Follow up.   Specialties:  Cardiology, Radiology, Interventional Cardiology Why:  You have a virtual follow-up visit scheduled for 11/17/2018 at 8:00am with Dr. Irish Lack.  Contact information: [address] [place] [address] [phone]            Time coordinating discharge:  37 minutes  Signed:   Estill Cotta M.D. [facility] 10/16/2018, 10:34 AM

## 2018-10-16 NOTE — Telephone Encounter (Signed)
   TELEPHONE CALL NOTE  This patient has been deemed a candidate for follow-up tele-health visit to limit community exposure during the Covid-19 pandemic. I spoke with the patient's wife on the phone (per the patient's request) to discuss instructions. This has been outlined on the patient's AVS (dotphrase: [email or web address]). The patient and his wife were advised to review the section on consent for treatment as well. The patient will receive a phone call 2-3 days prior to their E-Visit at which time consent will be verbally confirmed. A Virtual Office Visit appointment type has been scheduled for 11/17/2018 with Dr. Irish Lack, with "VIDEO" or "TELEPHONE" in the appointment notes - patient's wife willing to help with the VIDEO format.   Darreld Mclean, PA-C 10/16/2018 10:28 AM

## 2018-10-16 NOTE — Progress Notes (Signed)
Progress Note  Patient Name: Matthew [name] Date of Encounter: 10/16/2018  Primary Cardiologist: Larae Grooms, MD   Subjective   No significant overnight events following cardiac catheterization yesterday.  Inpatient Medications    Scheduled Meds: . aspirin  81 mg Oral Daily  . buPROPion  150 mg Oral Daily  . clopidogrel  75 mg Oral Q breakfast  . dronedarone  400 mg Oral BID WC  . ezetimibe  5 mg Oral QHS  . fenofibrate  54 mg Oral QHS  . ferrous sulfate  325 mg Oral Once per day on Tue Wed Fri  . finasteride  5 mg Oral QHS  . gabapentin  300 mg Oral BID  . insulin aspart  0-9 Units Subcutaneous TID WC  . insulin detemir  25 Units Subcutaneous BID  . isosorbide mononitrate  60 mg Oral Daily  . levothyroxine  175 mcg Oral QAC breakfast  . magnesium oxide  400 mg Oral BID  . metoprolol succinate  12.5 mg Oral Daily  . rosuvastatin  20 mg Oral QHS  . senna  2 tablet Oral q morning - 10a   And  . senna  1 tablet Oral QHS  . sodium chloride flush  3 mL Intravenous Q12H  . sucralfate  1 g Oral TID WC & HS  . traZODone  50 mg Oral QHS   Continuous Infusions: . sodium chloride     PRN Meds: sodium chloride, acetaminophen **OR** acetaminophen, oxyCODONE **AND** acetaminophen, ALPRAZolam, hydrOXYzine, linaclotide, mesalamine, mesalamine, morphine injection, nitroGLYCERIN, nortriptyline, ondansetron **OR** ondansetron (ZOFRAN) IV, sodium chloride flush   Vital Signs    Vitals:   10/15/18 1433 10/15/18 1438 10/15/18 1700 10/15/18 2100  BP: 130/78 139/85 121/61 107/67  Pulse: 77 67 60 (!) 59  Resp: 10 (!) 7 (!) 21 13  Temp:   97.8 F (36.6 C) (!) 97 F (36.1 C)  TempSrc:   Oral Oral  SpO2: 99% 100% 100% 97%  Weight:      Height:        Intake/Output Summary (Last 24 hours) at 10/16/2018 0711 Last data filed at 10/16/2018 0030 Gross per 24 hour  Intake 240 ml  Output 2250 ml  Net -2010 ml   Last 3 Weights 10/14/2018 10/13/2018 10/13/2018  Weight (lbs) 263 lb  14.3 oz 263 lb 12.8 oz 260 lb  Weight (kg) 119.7 kg 119.659 kg 117.935 kg      Telemetry     NSR  - Personally Reviewed  ECG    No new ECG tracings today. - Personally Reviewed  Physical Exam   Physical Exam per MD:    Physical Exam: Blood pressure 107/67, pulse (!) 59, temperature (!) 97 F (36.1 C), temperature source Oral, resp. rate 13, height 6' 1"  (1.854 m), weight 119.7 kg, SpO2 97 %.  GEN:   Chronically ill-appearing gentleman.  Moderately obese. HEENT: Normal NECK: No JVD; No carotid bruits LYMPHATICS: No lymphadenopathy CARDIAC: Regular rate S1-S2. RESPIRATORY:  Clear to auscultation without rales, wheezing or rhonchi  ABDOMEN: Moderately obese. MUSCULOSKELETAL: Good pulses.  No edema. SKIN: Warm and dry NEUROLOGIC:  Alert and oriented x 3   Labs    Chemistry Recent Labs  Lab 10/13/18 1343 10/14/18 0355 10/15/18 0651 10/16/18 0227  NA 137 139 137 135  K 4.7 4.1 4.1 4.2  CL 104 102 103 99  CO2 22 23 26 25   GLUCOSE 257* 262* 253* 203*  BUN 16 14 20 16   CREATININE 1.89* 1.92* 1.93* 1.83*  CALCIUM 8.9 9.3 9.2 8.7*  PROT 6.2*  --   --   --   ALBUMIN 3.1*  --   --   --   AST 26  --   --   --   ALT 19  --   --   --   ALKPHOS 29*  --   --   --   BILITOT 1.1  --   --   --   GFRNONAA 34* 34* 33* 36*  GFRAA 40* 39* 39* 41*  ANIONGAP 11 14 8 11      Hematology Recent Labs  Lab 10/14/18 0355 10/15/18 0628 10/16/18 0227  WBC 8.0 7.1 6.8  RBC 4.32 3.94* 4.04*  HGB 13.3 12.0* 12.3*  HCT 40.3 36.5* 37.6*  MCV 93.3 92.6 93.1  MCH 30.8 30.5 30.4  MCHC 33.0 32.9 32.7  RDW 13.5 13.3 13.6  PLT 214 183 188    Cardiac Enzymes Recent Labs  Lab 10/13/18 1343 10/14/18 0603  TROPONINI 0.06* 0.19*   No results for input(s): TROPIPOC in the last 168 hours.   BNPNo results for input(s): BNP, PROBNP in the last 168 hours.   DDimer No results for input(s): DDIMER in the last 168 hours.   Radiology    No results found.  Cardiac Studies   Left  Heart Catheterization 10/15/2018:  Prox RCA lesion is 100% stenosed.  SVG graft was visualized by angiography and is normal in caliber.  Prox Cx to Mid Cx lesion is 100% stenosed.  SVG graft was visualized by angiography and is normal in caliber.  Ost LAD to Prox LAD lesion is 100% stenosed.  LIMA graft was visualized by angiography and is normal in caliber.  SVG graft was not injected.  Mid Graft lesion before 2nd Mrg is 99% stenosed.  A drug-eluting stent was successfully placed using a STENT RESOLUTE [id number].  Post intervention, there is a 0% residual stenosis.   1. 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 Chronic occlusion proximal LAD. Patent LIMA to mid LAD. Chronic occlusion of the vein graft to the Diagonal branch (not injected-known occlusion) 3. Chronic occlusion mid Circumflex. Patent sequential vein graft to OM2/OM3. Severe stenosis mid body of SVG to OM2/OM3 within the old stent 4. Successful PTCA/DES x 1 mid body of SVG to OM2/OM3 5. Chronic occlusion proximal RCA. Patent SVG to distal RCA  Recommendations: ASA and Plavix along with Eliquis for one month. Would stop ASA at one month and continue Plavix along with Eliquis for at least one year. Continue statin and beta blocker. Resume Eliquis tomorrow.  _______________  Echocardiogram 10/11/2016: Study Conclusions: - Left ventricle: The cavity size was normal. Wall thickness was   increased in a pattern of moderate LVH. Systolic function was   normal. The estimated ejection fraction was in the range of 55%   to 60%. Features are consistent with a pseudonormal left   ventricular filling pattern, with concomitant abnormal relaxation   and increased filling pressure (grade 2 diastolic dysfunction). - Mitral valve: Calcified annulus. Mildly thickened leaflets .   There was mild regurgitation. - Left atrium: The atrium was mildly dilated. - Impressions: Poor acoustic windows limit study.   Impressions: - Poor acoustic windows limit study.  Patient Profile   Mr. Cromie is a 73 year old male with a history of CAD s/p CABG x4 in 2010, paroxysmal atrial fibrillation, AAA, carotid artery disease, diabetes mellitus, CKD, and renal cancer s/p right nephrectomy who presented with  chest pain.  Assessment & Plan    NSTEMI - Presentation somewhat atypical with upper abdominal pain and worsening after eating; however, pain did improve with sublingual Nitro. - EKG showed no acute ischemia - Troponin minimally elevated at 0.06 >> 0.19. - Left heart catheterization yesterday showed severe triple vessel CAD s/p 5 vessel CABG with 4/5 patent grafts. Severe stenosis of the mid body of the SVG to OM2/OM3 within an old stent was noted. Patient underwent successful PTCA/DES of the SVG to OM2/OM3. - Will do triple therapy with Aspirin, Plavix and Eliquis for 1 month. After 1 month, Aspirin may be stopped with continuation of Plavix along with Eliquis for at least 1 year. - Continue beta-blocker and statin/Zetia.  CAD s/p CABG  - Patient has history of CABG x4 in 2010 with subsequent PCI with DES to SVG-OM2 in 2018. - Continue aspirin, beta-blocker, and high-intensity statin/Zetia. - Continue Imdur 36m daily.  Paroxysmal Atrial Fibrillation  - Continue home Toprol-XL 12.58mdaily and Multaq 40077mwice daily. - Can restart Eliquis 5mg48mice daily today.  Diabetes Mellitus - Management per primary team.  CKD/ Renal Cancer s/p Right Nephrectomy in 2002 - Serum creatinine stable and actually slightly improved today at 1.83 compared to 1.93 yesterday.  For questions or updates, please contact [facility] consult [email or web address] for contact info under        Signed, CallDarreld Mclean-C  10/16/2018, 7:11 AM     Attending Note  See duplicate note from same day .    PhilMertie Moores  10/16/2018 10:04 AM    [email or web address]  [phone]: [phone]: [phone]

## 2018-10-16 NOTE — Progress Notes (Signed)
Progress Note  Patient Name: KYEN TAITE Date of Encounter: 10/16/2018  Primary Cardiologist: Larae Grooms, MD   Subjective   73 yo admitted with CP  Found to have elevated troponins c/w NSTEMI   S/p stenting of the SVT to OM2 / OM3  Doing well No further CP   Inpatient Medications    Scheduled Meds: . apixaban  5 mg Oral BID  . aspirin  81 mg Oral Daily  . buPROPion  150 mg Oral Daily  . clopidogrel  75 mg Oral Q breakfast  . dronedarone  400 mg Oral BID WC  . ezetimibe  5 mg Oral QHS  . fenofibrate  54 mg Oral QHS  . ferrous sulfate  325 mg Oral Once per day on Tue Wed Fri  . finasteride  5 mg Oral QHS  . gabapentin  300 mg Oral BID  . insulin aspart  0-9 Units Subcutaneous TID WC  . insulin detemir  25 Units Subcutaneous BID  . isosorbide mononitrate  60 mg Oral Daily  . levothyroxine  175 mcg Oral QAC breakfast  . magnesium oxide  400 mg Oral BID  . metoprolol succinate  12.5 mg Oral Daily  . rosuvastatin  20 mg Oral QHS  . senna  2 tablet Oral q morning - 10a   And  . senna  1 tablet Oral QHS  . sodium chloride flush  3 mL Intravenous Q12H  . sucralfate  1 g Oral TID WC & HS  . traZODone  50 mg Oral QHS   Continuous Infusions: . sodium chloride     PRN Meds: sodium chloride, acetaminophen **OR** acetaminophen, oxyCODONE **AND** acetaminophen, ALPRAZolam, hydrOXYzine, linaclotide, mesalamine, mesalamine, morphine injection, nitroGLYCERIN, nortriptyline, ondansetron **OR** ondansetron (ZOFRAN) IV, sodium chloride flush   Vital Signs    Vitals:   10/15/18 1700 10/15/18 2100 10/16/18 0854 10/16/18 0855  BP: 121/61 107/67  111/72  Pulse: 60 (!) 59    Resp: (!) 21 13 13 17   Temp: 97.8 F (36.6 C) (!) 97 F (36.1 C)    TempSrc: Oral Oral    SpO2: 100% 97%    Weight:      Height:        Intake/Output Summary (Last 24 hours) at 10/16/2018 0932 Last data filed at 10/16/2018 0030 Gross per 24 hour  Intake 240 ml  Output 2250 ml  Net -2010 ml    Last 3 Weights 10/14/2018 10/13/2018 10/13/2018  Weight (lbs) 263 lb 14.3 oz 263 lb 12.8 oz 260 lb  Weight (kg) 119.7 kg 119.659 kg 117.935 kg      Telemetry    NSR - Personally Reviewed  ECG    No new ECG tracings today. - Personally Reviewed  Physical Exam   Physical Exam per MD:    Physical Exam: Blood pressure 111/72, pulse (!) 59, temperature (!) 97 F (36.1 C), temperature source Oral, resp. rate 17, height 6' 1"  (1.854 m), weight 119.7 kg, SpO2 97 %.  GEN:   Elderly man .  nad  HEENT: Normal NECK: No JVD; No carotid bruits LYMPHATICS: No lymphadenopathy CARDIAC: RRR   RESPIRATORY:  Clear to auscultation without rales, wheezing or rhonchi  ABDOMEN: Soft, non-tender, non-distended MUSCULOSKELETAL:  No edema; No deformity .  Left radial cath site looks good.  Good pulses.  SKIN: Warm and dry NEUROLOGIC:  Alert and oriented x 3   Labs    Chemistry Recent Labs  Lab 10/13/18 1343 10/14/18 5537 10/15/18 4827 10/16/18 0786  NA 137 139 137 135  K 4.7 4.1 4.1 4.2  CL 104 102 103 99  CO2 22 23 26 25   GLUCOSE 257* 262* 253* 203*  BUN 16 14 20 16   CREATININE 1.89* 1.92* 1.93* 1.83*  CALCIUM 8.9 9.3 9.2 8.7*  PROT 6.2*  --   --   --   ALBUMIN 3.1*  --   --   --   AST 26  --   --   --   ALT 19  --   --   --   ALKPHOS 29*  --   --   --   BILITOT 1.1  --   --   --   GFRNONAA 34* 34* 33* 36*  GFRAA 40* 39* 39* 41*  ANIONGAP 11 14 8 11      Hematology Recent Labs  Lab 10/14/18 0355 10/15/18 0628 10/16/18 0227  WBC 8.0 7.1 6.8  RBC 4.32 3.94* 4.04*  HGB 13.3 12.0* 12.3*  HCT 40.3 36.5* 37.6*  MCV 93.3 92.6 93.1  MCH 30.8 30.5 30.4  MCHC 33.0 32.9 32.7  RDW 13.5 13.3 13.6  PLT 214 183 188    Cardiac Enzymes Recent Labs  Lab 10/13/18 1343 10/14/18 0603  TROPONINI 0.06* 0.19*   No results for input(s): TROPIPOC in the last 168 hours.   BNPNo results for input(s): BNP, PROBNP in the last 168 hours.   DDimer No results for input(s): DDIMER in the last  168 hours.   Radiology    No results found.  Cardiac Studies   Left Heart Catheterization 10/10/2016:  Prox LAD to Mid LAD lesion, 100 %stenosed.  Prox RCA to Mid RCA lesion, 100 %stenosed.  Mid Cx lesion, 95 %stenosed.  Ost 2nd Mrg to 2nd Mrg lesion, 100 %stenosed.  Dist Cx lesion, 100 %stenosed.  LIMA and is normal in caliber and anatomically normal.  SVG and is normal in caliber and anatomically normal.  Seq SVG- OM2-OM3 and is normal in caliber.  Dist Graft-2 lesion, 50 %stenosed.  SVG.  Origin lesion, 100 %stenosed.  A STENT SYNERGY DES 3X28 drug eluting stent was successfully placed.  Dist Graft-1 lesion, 95 %stenosed.  Post intervention, there is a 0% residual stenosis.  Prox Graft to Mid Graft lesion, 25 %stenosed.   Severe native CAD with total occlusion of the proximal LAD at a site of prior proximal stenting after a small diagonal vessel; subtotal occlusion of the circumflex vessel prior to the OM2, with occlusion of the ostium of the OM1, and diffuse 95% mid AV groove stenosis with total distal occlusion before the distal marginal branch; and total proximal RCA occlusion.  Patent LIMA graft which supplies the mid LAD and 2 diagonal vessels.  The sequential vein graft which supplies a large OM 2 - OM3  has mild diffuse 20-30% stenosis proximally in the graft and in the mid body of the graft there is a sequential stenosis of 95%, followed by eccentric 50% narrowing with TIMI-3 flow down both marginal branches.  Ostially occluded vein graft which has supplied a diagonal vessel.  Patent SVG to the distal RCA.  Successful PCI to the sequential SVG supplying the marginal vessels with ultimate insertion of a 3.028 mm Synergy DES stent to cover both lesions, postdilated to 3.18 mm with the 95 and 50% stenoses being reduced to 0%.  There is brisk TIMI-3 flow down to both marginal branches.  Recommendation: The patient has a history of atrial fibrillation  but is in sinus rhythm today.  Eliquis will most likely be reinstituted and the patient will be on short-term triple drug therapy with ultimate plans for Plavix/Eliquis. _______________  Echocardiogram 10/11/2016: Study Conclusions: - Left ventricle: The cavity size was normal. Wall thickness was   increased in a pattern of moderate LVH. Systolic function was   normal. The estimated ejection fraction was in the range of 55%   to 60%. Features are consistent with a pseudonormal left   ventricular filling pattern, with concomitant abnormal relaxation   and increased filling pressure (grade 2 diastolic dysfunction). - Mitral valve: Calcified annulus. Mildly thickened leaflets .   There was mild regurgitation. - Left atrium: The atrium was mildly dilated. - Impressions: Poor acoustic windows limit study.  Impressions: - Poor acoustic windows limit study.  Patient Profile   Mr. Ackerley is a 72 year old male with a history of CAD s/p CABG x4 in 2010, paroxysmal atrial fibrillation, AAA, carotid artery disease, diabetes mellitus, CKD, and renal cancer s/p right nephrectomy who presented with chest pain.  Assessment & Plan    NSTEMI S/p stenting of SVG to OM2/OM 3 He is on Eliquis , plavix, ASA The plan is to stop ASA in 1 month and continue the Eliquis and plavix long term.  Follow up with Dr. Irish Lack or APP in 1 month    CAD s/p CABG  - Patient has history of CABG x4 in 2010 with subsequent PCI with DES to SVG-OM2 in 2018.  .  Paroxysmal Atrial Fibrillation  - Continue home Toprol-XL 12.33m daily and Multaq 4084mtwice daily. Continue eliquis   CKD/ Renal Cancer s/p Right Nephrectomy in 2002 - Patient was gently hydrated overnight in preparation for cardiac cath today but creatinine is still 1.93 this morning. Baseline creatinine around 1.5 to 1.7.  - Will need to limit contrast dye during procedure.  Thrombocytopenia Repeat platelet count looks good   For questions or  updates, please contact [facility] consult [email or web address] for contact info under

## 2018-10-16 NOTE — Progress Notes (Signed)
Cardiac Rehab Advisory Cardiac Rehab Phase I is not seeing pts face to face at this time due to Covid 19 restrictions. Ambulation is occurring through nursing, PT, and mobility teams. We will help facilitate that process as needed. We are calling pts in their rooms and discussing education. We will then deliver education materials to pts RN for delivery to pt.    [phone] Called patient's room to review MI/stent education with patient. Patient requested that I call his wife [name] and provide the information to her because pt states he has trouble with memory. I called patient's wife and provided education regarding restrictions, risk factor modification, Plavix, CP, NTG use, calling 911, and activity progression. Mrs. Ramirez states that she is aware of this information and verbalizes understanding as she works in the [employer]. Discussed phase 2 cardia rehab, and referral will be sent to the program at [facility].  Sol Passer, MS, ACSM CEP

## 2018-10-16 NOTE — Discharge Instructions (Signed)
YOUR CARDIOLOGY TEAM HAS ARRANGED FOR AN E-VISIT FOR YOUR APPOINTMENT - PLEASE REVIEW IMPORTANT INFORMATION BELOW SEVERAL DAYS PRIOR TO YOUR APPOINTMENT  Due to the recent COVID-19 pandemic, we are transitioning in-person office visits to tele-medicine visits in an effort to decrease unnecessary exposure to our patients, their families, and staff. These visits are billed to your insurance just like a normal visit is. We also encourage you to sign up for MyChart if you have not already done so. You will need a smartphone if possible. For patients that do not have this, we can still complete the visit using a regular telephone but do prefer a smartphone to enable video when possible. You may have a family member that lives with you that can help. If possible, we also ask that you have a blood pressure cuff and scale at home to measure your blood pressure, heart rate and weight prior to your scheduled appointment. Patients with clinical needs that need an in-person evaluation and testing will still be able to come to the office if absolutely necessary. If you have any questions, feel free to call our office.   YOUR PROVIDER WILL BE USING ONE OF THE FOLLOWING PLATFORM TO COMPLETE YOUR VISIT: MyChart/Doximity/[email or web address] (our office will call you a few days before your visit with more information)   IF USING MYCHART - How to Download the MyChart App to Your SmartPhone   - If Apple, go to [employer] and type in MyChart in the search bar and download the app. If Android, ask patient to go to [employer] and type in [place] in the search bar and download the app. The app is free but as with any other app downloads, your phone may require you to verify saved payment information or Apple/Android password.  - You will need to then log into the app with your MyChart username and password, and select [facility] as your healthcare provider to link the account.  - When it is time for your visit, go to the MyChart  app, find appointments, and click Begin Video Visit. Be sure to Select Allow for your device to access the Microphone and Camera for your visit. You will then be connected, and your provider will be with you shortly.  **If you have any issues connecting or need assistance, please contact MyChart service desk [phone])**  **If using a computer, in order to ensure the best quality for your visit, you will need to use either of the following Internet Browsers: Insurance underwriter or Microsoft Edge**   IF USING DOXIMITY or [email or web address].ME - The staff will give you instructions on receiving your link to join the meeting the day of your visit.      2-3 DAYS BEFORE YOUR APPOINTMENT  You will receive a telephone call from one of our [place] team members - your caller ID may say "Unknown caller." If this is a video visit, we will walk you through how to get the video launched on your phone. We will remind you check your blood pressure, heart rate and weight prior to your scheduled appointment. If you have an Apple Watch or Kardia, please upload any pertinent ECG strips the day before or morning of your appointment to [place]. Our staff will also make sure you have reviewed the consent and agree to move forward with your scheduled tele-health visit.     THE DAY OF YOUR APPOINTMENT  Approximately 15 minutes prior to your scheduled appointment, you will receive a telephone call  from one of [facility] team - your caller ID may say "Unknown caller."  Our staff will confirm medications, vital signs for the day and any symptoms you may be experiencing. Please have this information available prior to the time of visit start. It may also be helpful for you to have a pad of paper and pen handy for any instructions given during your visit. They will also walk you through joining the smartphone meeting if this is a video visit.    CONSENT FOR TELE-HEALTH VISIT - PLEASE REVIEW  I hereby voluntarily request,  consent and authorize [facility] and its employed or contracted physicians, physician assistants, nurse practitioners or other licensed health care professionals (the Practitioner), to provide me with telemedicine health care services (the Services") as deemed necessary by the treating Practitioner. I acknowledge and consent to receive the Services by the Practitioner via telemedicine. I understand that the telemedicine visit will involve communicating with the Practitioner through live audiovisual communication technology and the disclosure of certain medical information by electronic transmission. I acknowledge that I have been given the opportunity to request an in-person assessment or other available alternative prior to the telemedicine visit and am voluntarily participating in the telemedicine visit.  I understand that I have the right to withhold or withdraw my consent to the use of telemedicine in the course of my care at any time, without affecting my right to future care or treatment, and that the Practitioner or I may terminate the telemedicine visit at any time. I understand that I have the right to inspect all information obtained and/or recorded in the course of the telemedicine visit and may receive copies of available information for a reasonable fee.  I understand that some of the potential risks of receiving the Services via telemedicine include:   Delay or interruption in medical evaluation due to technological equipment failure or disruption;  Information transmitted may not be sufficient (e.g. poor resolution of images) to allow for appropriate medical decision making by the Practitioner; and/or   In rare instances, security protocols could fail, causing a breach of personal health information.  Furthermore, I acknowledge that it is my responsibility to provide information about my medical history, conditions and care that is complete and accurate to the best of my ability. I  acknowledge that Practitioner's advice, recommendations, and/or decision may be based on factors not within their control, such as incomplete or inaccurate data provided by me or distortions of diagnostic images or specimens that may result from electronic transmissions. I understand that the practice of medicine is not an exact science and that Practitioner makes no warranties or guarantees regarding treatment outcomes. I acknowledge that I will receive a copy of this consent concurrently upon execution via email to the email address I last provided but may also request a printed copy by calling the office of [facility].    I understand that my insurance will be billed for this visit.   I have read or had this consent read to me.  I understand the contents of this consent, which adequately explains the benefits and risks of the Services being provided via telemedicine.   I have been provided ample opportunity to ask questions regarding this consent and the Services and have had my questions answered to my satisfaction.  I give my informed consent for the services to be provided through the use of telemedicine in my medical care  By participating in this telemedicine visit I agree to the above.

## 2018-11-11 ENCOUNTER — Telehealth: Payer: Self-pay

## 2018-11-11 NOTE — Telephone Encounter (Signed)

## 2018-11-13 ENCOUNTER — Other Ambulatory Visit: Payer: Self-pay | Admitting: Neurology

## 2018-11-16 NOTE — Progress Notes (Signed)
Virtual Visit via Video Note   This visit type was conducted due to national recommendations for restrictions regarding the COVID-19 Pandemic (e.g. social distancing) in an effort to limit this patient's exposure and mitigate transmission in our community.  Due to his co-morbid illnesses, this patient is at least at moderate risk for complications without adequate follow up.  This format is felt to be most appropriate for this patient at this time.  All issues noted in this document were discussed and addressed.  A limited physical exam was performed with this format.  Please refer to the patient's chart for his consent to telehealth for [facility].   Date:  11/17/2018   ID:  Matthew [name], DOB 12-31-1945, MRN [id number]  Patient Location: Home Provider Location: Home  PCP:  Marda Stalker, PA-C  Cardiologist:  Larae Grooms, MD  Electrophysiologist:  None   Evaluation Performed:  Follow-Up Visit  Chief Complaint:  CAD  History of Present Illness:    Matthew [name] is a 73 y.o. male with CAD, AFib and Amiodarone induced pulmonary toxicity, liver toxicity.  His cardiac history is significant for coronary artery disease as well as paroxysmal atrial fibrillation. He is on Eliquis for anticoagulation as well as amiodarone for rhythm control. He has a history of CABG 4 in 2010, AAA and carotid artery disease (both followed at the [place]) , chronic kidney disease and diabetes. He initially presented to [facility] emergency department on 10/08/16 with chest discomfort and fatigue similar to his previous angina. He ruled in for non-STEMI with troponin peaking at 0.55. EKG on admission showed sinus bradycardia with right bundle branch block and old inferior Q waves. His Eliquis was held for anticipated cath and he was transferred to [facility] for further management. On 10/10/16, he underwent left heart catheterization per Dr. Claiborne Billings. Angiographic details are outlined in cath  report below. He essentially underwent PCI utilizing a drug-eluting stent to the saphenous vein graft to OM2/OM 3 which was 95% stenosed. Echocardiogram showed normal left ventricular ejection fraction of 55-60%. He was placed on triple therapy with aspirin, Plavix and continued on Eliquis. Plan is to continue triple therapy for one month. After 30 days, aspirin will be discontinued. He will continue on Plavix and Eliquis.  His statin medications were also adjusted by the [place].  LDL was noted to be elevated at 113 mg/dL. He was recently changed from Lipitor to Crestor.  He was discharged home in stable condition on 10/11/2016.  Followed for AFib at [facility].  He had a NSTEMI in 10/2018.  Cath showed:   Prox RCA lesion is 100% stenosed.  SVG graft was visualized by angiography and is normal in caliber.  Prox Cx to Mid Cx lesion is 100% stenosed.  SVG graft was visualized by angiography and is normal in caliber.  Ost LAD to Prox LAD lesion is 100% stenosed.  LIMA graft was visualized by angiography and is normal in caliber.  SVG graft was not injected.  Mid Graft lesion before 2nd Mrg is 99% stenosed.  A drug-eluting stent was successfully placed using a STENT RESOLUTE [id number].  Post intervention, there is a 0% residual stenosis.  He has done well since leaving the hospital.  His walking is limited by back pain. The patient does not have symptoms concerning for COVID-19 infection (fever, chills, cough, or new shortness of breath).    Past Medical History:  Diagnosis Date   AAA (abdominal aortic aneurysm) ([place])  a. Aortic US 05/2012: 3.9 cm fusiform abdominal aortic aneurysm. F/u recommended 05/2014.   Anemia    Arthritis    DDD   Atrial fibrillation ([place])    a. Post-op CABG 05/2009. b. Re-identified 12/2013.    CAD (coronary artery disease)    a. s/p CABGx4 05/2009. 10/10/16 PCI with DES x1 SVG-->OM2/OM3   Chronic constipation    CKD (chronic kidney disease), stage  III (HCC)    Cocaine abuse in remission [facility])    Colon polyps    Depression    Diabetes mellitus    Empyema lung ([place])    a. PNA 02/2011 c/b empyema requiring chest tube, drainage of pleural effusion and decortication.   Enlarged prostate    Frequent falls    GERD (gastroesophageal reflux disease)    Headache    Hepatic steatosis    Hyperlipidemia    Hypertension    Hypothyroidism    Iron deficiency anemia    LAFB (left anterior fascicular block)    Lung cancer (HCC)    a. Tx with interleukin in 2002 per patient.   Myocardial infarction ([place])    10/08/16   Nodule of left lung    a. Cavitary nodules 02/2011, f/u recommended.   Obesity    Paroxysmal supraventricular tachycardia ([place])    a. While inpatient 02/2011 sick with empyema - wide appearace due to RBBB.   Pneumonia    Post concussion syndrome    RBBB    Renal cell carcinoma    a. s/p R nephrectomy 2002. Reported pulmonary mets per note at that time.   Scrotal abscess    a. 2012 2/2 Streptococcus group C, status post incision and drainage.   Sleep apnea    wears CPAP   Ulcerative proctitis ([place])    a. Remote dx dating back to 1979.   Vitamin D deficiency    Wears dentures    Past Surgical History:  Procedure Laterality Date   Bronchoscopy, right video-assisted thoracoscopy, minithoracotomy, drainage of pleural effusion, and decortication.  02/26/2011   [facility]   CABG x 4  05/13/2009   [facility]   CARDIAC SURGERY     COLONOSCOPY W/ BIOPSIES AND POLYPECTOMY     CORONARY STENT INTERVENTION N/A 10/10/2016   Procedure: Coronary Stent Intervention;  Surgeon: Troy Sine, MD;  Location: [place] [facility];  Service: Cardiovascular;  Laterality: N/A;   CORONARY STENT INTERVENTION N/A 10/15/2018   Procedure: CORONARY STENT INTERVENTION;  Surgeon: Burnell Blanks, MD;  Location: [place] [facility];  Service: Cardiovascular;  Laterality: N/A;   HEMORRHOID SURGERY     HERNIA  REPAIR     Incision, drainage and debridement of perineum and  scrotum     Irrigation and debridement of scrotal wound and closure of scrotal wound.     LEFT HEART CATH AND CORS/GRAFTS ANGIOGRAPHY N/A 10/10/2016   Procedure: Left Heart Cath and Cors/Grafts Angiography;  Surgeon: Troy Sine, MD;  Location: [place] [facility];  Service: Cardiovascular;  Laterality: N/A;   LEFT HEART CATH AND CORS/GRAFTS ANGIOGRAPHY N/A 10/15/2018   Procedure: LEFT HEART CATH AND CORS/GRAFTS ANGIOGRAPHY;  Surgeon: Burnell Blanks, MD;  Location: [place] [facility];  Service: Cardiovascular;  Laterality: N/A;   MULTIPLE TOOTH EXTRACTIONS     RADIOLOGY WITH ANESTHESIA N/A 05/09/2017   Procedure: MRI WITH ANESTHESIA   BRAIN WITHOUT CONTRAST;  Surgeon: Radiologist, Medication, MD;  Location: [place];  Service: Radiology;  Laterality: N/A;   Right radical nephrectomy.  TOOTH EXTRACTION       Current Meds  Medication Sig   ALPRAZolam (XANAX) 0.25 MG tablet Take 1 tablet (0.25 mg total) by mouth at bedtime as needed for anxiety. (Patient taking differently: Take 0.25 mg by mouth at bedtime as needed for anxiety or sleep. )   apixaban (ELIQUIS) 5 MG TABS tablet Take 5 mg by mouth 2 (two) times daily.   buPROPion (WELLBUTRIN XL) 150 MG 24 hr tablet Take 150 mg by mouth daily.   capsaicin (ZOSTRIX) 0.025 % cream Apply 1 application topically 2 (two) times daily as needed (for pain).    Cholecalciferol (D3-1000) 25 MCG (1000 UT) tablet Take 1,000 Units by mouth at bedtime.   clopidogrel (PLAVIX) 75 MG tablet Take 1 tablet (75 mg total) by mouth daily with breakfast.   clotrimazole (LOTRIMIN) 1 % cream Apply 1 application topically 2 (two) times daily as needed (for rash).    dronedarone (MULTAQ) 400 MG tablet Take 400 mg by mouth 2 (two) times daily with a meal.   ezetimibe (ZETIA) 10 MG tablet Take 5 mg by mouth at bedtime.   fenofibrate 54 MG tablet Take 54 mg by mouth at bedtime.   ferrous  sulfate 325 (65 FE) MG tablet Take 325 mg by mouth 3 (three) times a week. Tuesday, Wednesday and Friday   finasteride (PROSCAR) 5 MG tablet Take 5 mg by mouth at bedtime.    gabapentin (NEURONTIN) 300 MG capsule Take 300 mg by mouth 2 (two) times daily.    glucose 4 GM chewable tablet Chew 1 tablet by mouth as needed for low blood sugar.   hydrOXYzine (ATARAX/VISTARIL) 10 MG tablet Take 10 mg by mouth daily as needed for itching.   ibuprofen (ADVIL) 200 MG tablet Take 200 mg by mouth every 8 (eight) hours as needed (for gout pain).   insulin aspart (NOVOLOG FLEXPEN) 100 UNIT/ML FlexPen Inject 10-40 Units into the skin 2 (two) times daily after a meal. Lunch and Dinner(evening meal)   Insulin Detemir (LEVEMIR FLEXPEN) 100 UNIT/ML Pen Inject 50 Units into the skin 2 (two) times daily.    isosorbide mononitrate (IMDUR) 60 MG 24 hr tablet Take 1 tablet (60 mg total) by mouth daily. Hold if blood pressure <100   ketoconazole (NIZORAL) 2 % cream Apply 1 application topically daily as needed for irritation (or rashes).    levothyroxine (SYNTHROID) 175 MCG tablet Take 175 mcg by mouth daily before breakfast.   LIDOCAINE EX Apply 1 application topically 2 (two) times daily as needed (for pain).    linaclotide (LINZESS) 290 MCG CAPS capsule Take 290 mcg by mouth daily as needed (for constipation).   Magnesium Oxide 420 MG TABS Take 420 mg by mouth 2 (two) times a day.    MENTHOL-METHYL SALICYLATE EX Apply 1 application topically as needed (pain).   mesalamine (CANASA) 1000 MG suppository Place 1,000 mg rectally at bedtime as needed (ulcerative colitis flares).    mesalamine (LIALDA) 1.2 g EC tablet Take 1.2 g by mouth daily as needed (ulcerative colitis flares).   methylPREDNISolone (MEDROL) 4 MG tablet Take 4 mg by mouth 2 (two) times daily as needed (gout flares).    metoprolol succinate (TOPROL-XL) 25 MG 24 hr tablet Take 12.5 mg by mouth daily.   mupirocin ointment (BACTROBAN) 2 %  Place 1 application into the nose daily as needed (for mersa).    neomycin-bacitracin-polymyxin (NEOSPORIN) ointment Apply 1 application topically as needed for wound care.   nitroGLYCERIN (NITROSTAT) 0.4 MG  SL tablet Place 0.4 mg under the tongue every 5 (five) minutes x 3 doses as needed for chest pain.    nortriptyline (PAMELOR) 25 MG capsule Take 25 mg by mouth at bedtime as needed for headache.   nystatin cream (MYCOSTATIN) Apply 1 application topically daily as needed for dry skin (or rash).    Omega-3 Fatty Acids (FISH OIL) 1000 MG CAPS Take 3,000 mg by mouth 2 (two) times daily.    OVER THE COUNTER MEDICATION "[place]" chewable for acid reflux= Chew 1-2 tablets by mouth after meals or at bedtime as needed for indigestion   oxyCODONE-acetaminophen (PERCOCET) 10-325 MG per tablet Take 1 tablet by mouth every 6 (six) hours as needed for pain.    RABEprazole (ACIPHEX) 20 MG tablet Take 40 mg by mouth 2 (two) times daily before a meal.    rosuvastatin (CRESTOR) 40 MG tablet Take 20 mg by mouth at bedtime.    senna (SENOKOT) 8.6 MG TABS tablet Take 1-2 tablets by mouth See admin instructions. Take 2 tablets by mouth in the morning and 1 tablet at bedtime   silver sulfADIAZINE (SILVADENE) 1 % cream Apply 1 application topically daily as needed (skin care).    terbinafine (LAMISIL) 1 % cream Apply 1 application topically daily as needed (skin care).    traZODone (DESYREL) 50 MG tablet Take 1 tablet (50 mg total) by mouth at bedtime.   triamcinolone cream (KENALOG) 0.1 % Apply 1 application topically daily as needed (rash).   urea 10 % lotion Apply 1 application topically as needed for dry skin.     Allergies:   Amiodarone and Tetanus toxoids   Social History   Tobacco Use   Smoking status: Never Smoker   Smokeless tobacco: Never Used  Substance Use Topics   Alcohol use: No   Drug use: Yes    Types: Cocaine    Comment: stopped cocaine 2 and 1/2 years ago     Family  Hx: The patient's family history includes Alzheimer's disease in his mother; Heart disease in his father; Pneumonia in an other family member. There is no history of Colon cancer, Esophageal cancer, Stomach cancer, or Rectal cancer.  ROS:   Please see the history of present illness.    Back pain; no swelling All other systems reviewed and are negative.   Prior CV studies:   The following studies were reviewed today:  CT scan in 10/2018: 1. 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 2. No evidence of thoracic aortic aneurysmal disease or dissection. 3. 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 No evidence of abdominal aortic dissection or rupture. 4. 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 5. Status post right nephrectomy. 6. Moderate hiatal hernia. 7. Bilateral small inguinal hernias containing fat.  Labs/Other Tests and Data Reviewed:    EKG:  An ECG dated 10/16/2018 was personally reviewed today and demonstrated:  NSR, RBBB  Recent Labs: 10/13/2018: ALT 19 10/16/2018: BUN 16; Creatinine, Ser 1.83; Hemoglobin 12.3; Platelets 188; Potassium 4.2; Sodium 135   Recent Lipid Panel Lab Results  Component Value Date/Time   CHOL 194 10/09/2016 05:06 AM   TRIG 265 (H) 10/09/2016 05:06 AM   HDL 28 (L) 10/09/2016 05:06 AM   CHOLHDL 6.9 10/09/2016 05:06 AM   LDLCALC 113 (H) 10/09/2016 05:06 AM    Wt Readings from Last 3 Encounters:  11/17/18 262 lb (118.8 kg)  10/14/18 263 lb 14.3 oz (  119.7 kg)  08/22/18 264 lb 8 oz (120 kg)     Objective:    Vital Signs:  Ht 6' 1"  (1.854 m)    Wt 262 lb (118.8 kg)    BMI 34.57 kg/m    VITAL SIGNS:  reviewed RESPIRATORY:  no shortness of breath PSYCH:  normal affect exam limited by phone format  ASSESSMENT & PLAN:    1. CAD: No angina. Continue aggressive secondary prevention.  No aspirin.  Continue Plavix and ELiquis.  COnsider stopping Plavix in 5-6  months. Will need to get lipid results from the [place]. 2. AFib: In NSR at last check. On Multaq. 3. Old MI: No CHF sx.  4. AAA: Small AAA.  Needs repeat u/s in 5/21. 5. CRI: Stable at last check. 6. OSA: Using CPAP now.   COVID-19 Education: The signs and symptoms of COVID-19 were discussed with the patient and how to seek care for testing (follow up with PCP or arrange E-visit).  The importance of social distancing was discussed today.  Time:   Today, I have spent 15 minutes with the patient with telehealth technology discussing the above problems.     Medication Adjustments/Labs and Tests Ordered: Current medicines are reviewed at length with the patient today.  Concerns regarding medicines are outlined above.   Tests Ordered: No orders of the defined types were placed in this encounter.   Medication Changes: No orders of the defined types were placed in this encounter.   Disposition:  Follow up in 6 month(s)  Signed, Larae Grooms, MD  11/17/2018 8:25 AM    [facility]

## 2018-11-17 ENCOUNTER — Other Ambulatory Visit: Payer: Self-pay

## 2018-11-17 ENCOUNTER — Telehealth (INDEPENDENT_AMBULATORY_CARE_PROVIDER_SITE_OTHER): Payer: 59 | Admitting: Interventional Cardiology

## 2018-11-17 ENCOUNTER — Encounter: Payer: Self-pay | Admitting: Interventional Cardiology

## 2018-11-17 VITALS — Ht 73.0 in | Wt 262.0 lb

## 2018-11-17 DIAGNOSIS — I252 Old myocardial infarction: Secondary | ICD-10-CM

## 2018-11-17 DIAGNOSIS — I4821 Permanent atrial fibrillation: Secondary | ICD-10-CM | POA: Diagnosis not present

## 2018-11-17 DIAGNOSIS — I714 Abdominal aortic aneurysm, without rupture, unspecified: Secondary | ICD-10-CM

## 2018-11-17 DIAGNOSIS — I25118 Atherosclerotic heart disease of native coronary artery with other forms of angina pectoris: Secondary | ICD-10-CM

## 2018-11-17 DIAGNOSIS — N183 Chronic kidney disease, stage 3 unspecified: Secondary | ICD-10-CM

## 2018-11-17 NOTE — Patient Instructions (Signed)

## 2018-11-20 ENCOUNTER — Telehealth (HOSPITAL_COMMUNITY): Payer: Self-pay | Admitting: *Deleted

## 2019-02-23 ENCOUNTER — Ambulatory Visit: Payer: Medicare Other | Admitting: Family Medicine

## 2019-03-04 ENCOUNTER — Ambulatory Visit: Payer: Medicare Other | Admitting: Family Medicine

## 2019-08-22 ENCOUNTER — Emergency Department (HOSPITAL_COMMUNITY): Payer: No Typology Code available for payment source

## 2019-08-22 ENCOUNTER — Encounter (HOSPITAL_COMMUNITY): Payer: Self-pay | Admitting: Emergency Medicine

## 2019-08-22 ENCOUNTER — Other Ambulatory Visit: Payer: Self-pay

## 2019-08-22 ENCOUNTER — Observation Stay (HOSPITAL_COMMUNITY)
Admission: EM | Admit: 2019-08-22 | Discharge: 2019-08-23 | Disposition: A | Discharge status: Home / Self Care | Payer: No Typology Code available for payment source | Attending: Internal Medicine | Admitting: Internal Medicine

## 2019-08-22 DIAGNOSIS — Z20822 Contact with and (suspected) exposure to covid-19: Secondary | ICD-10-CM | POA: Insufficient documentation

## 2019-08-22 DIAGNOSIS — M1990 Unspecified osteoarthritis, unspecified site: Secondary | ICD-10-CM | POA: Insufficient documentation

## 2019-08-22 DIAGNOSIS — E1142 Type 2 diabetes mellitus with diabetic polyneuropathy: Secondary | ICD-10-CM | POA: Insufficient documentation

## 2019-08-22 DIAGNOSIS — F419 Anxiety disorder, unspecified: Secondary | ICD-10-CM | POA: Diagnosis not present

## 2019-08-22 DIAGNOSIS — F329 Major depressive disorder, single episode, unspecified: Secondary | ICD-10-CM | POA: Diagnosis not present

## 2019-08-22 DIAGNOSIS — Z85528 Personal history of other malignant neoplasm of kidney: Secondary | ICD-10-CM | POA: Insufficient documentation

## 2019-08-22 DIAGNOSIS — I471 Supraventricular tachycardia: Secondary | ICD-10-CM | POA: Insufficient documentation

## 2019-08-22 DIAGNOSIS — I714 Abdominal aortic aneurysm, without rupture, unspecified: Secondary | ICD-10-CM | POA: Diagnosis present

## 2019-08-22 DIAGNOSIS — I252 Old myocardial infarction: Secondary | ICD-10-CM | POA: Diagnosis not present

## 2019-08-22 DIAGNOSIS — G4733 Obstructive sleep apnea (adult) (pediatric): Secondary | ICD-10-CM | POA: Diagnosis present

## 2019-08-22 DIAGNOSIS — K760 Fatty (change of) liver, not elsewhere classified: Secondary | ICD-10-CM | POA: Diagnosis not present

## 2019-08-22 DIAGNOSIS — E1122 Type 2 diabetes mellitus with diabetic chronic kidney disease: Secondary | ICD-10-CM | POA: Diagnosis not present

## 2019-08-22 DIAGNOSIS — Z951 Presence of aortocoronary bypass graft: Secondary | ICD-10-CM | POA: Insufficient documentation

## 2019-08-22 DIAGNOSIS — Z7902 Long term (current) use of antithrombotics/antiplatelets: Secondary | ICD-10-CM | POA: Diagnosis not present

## 2019-08-22 DIAGNOSIS — I951 Orthostatic hypotension: Secondary | ICD-10-CM | POA: Insufficient documentation

## 2019-08-22 DIAGNOSIS — Z794 Long term (current) use of insulin: Secondary | ICD-10-CM | POA: Insufficient documentation

## 2019-08-22 DIAGNOSIS — Z85118 Personal history of other malignant neoplasm of bronchus and lung: Secondary | ICD-10-CM

## 2019-08-22 DIAGNOSIS — R079 Chest pain, unspecified: Secondary | ICD-10-CM

## 2019-08-22 DIAGNOSIS — Z7952 Long term (current) use of systemic steroids: Secondary | ICD-10-CM | POA: Insufficient documentation

## 2019-08-22 DIAGNOSIS — I4891 Unspecified atrial fibrillation: Secondary | ICD-10-CM | POA: Diagnosis present

## 2019-08-22 DIAGNOSIS — Z79899 Other long term (current) drug therapy: Secondary | ICD-10-CM | POA: Insufficient documentation

## 2019-08-22 DIAGNOSIS — Z6836 Body mass index (BMI) 36.0-36.9, adult: Secondary | ICD-10-CM | POA: Insufficient documentation

## 2019-08-22 DIAGNOSIS — I2510 Atherosclerotic heart disease of native coronary artery without angina pectoris: Secondary | ICD-10-CM | POA: Insufficient documentation

## 2019-08-22 DIAGNOSIS — E1159 Type 2 diabetes mellitus with other circulatory complications: Secondary | ICD-10-CM | POA: Diagnosis not present

## 2019-08-22 DIAGNOSIS — Z955 Presence of coronary angioplasty implant and graft: Secondary | ICD-10-CM | POA: Insufficient documentation

## 2019-08-22 DIAGNOSIS — N1832 Chronic kidney disease, stage 3b: Secondary | ICD-10-CM | POA: Diagnosis not present

## 2019-08-22 DIAGNOSIS — I129 Hypertensive chronic kidney disease with stage 1 through stage 4 chronic kidney disease, or unspecified chronic kidney disease: Secondary | ICD-10-CM | POA: Diagnosis not present

## 2019-08-22 DIAGNOSIS — E039 Hypothyroidism, unspecified: Secondary | ICD-10-CM | POA: Diagnosis not present

## 2019-08-22 DIAGNOSIS — I480 Paroxysmal atrial fibrillation: Secondary | ICD-10-CM | POA: Diagnosis not present

## 2019-08-22 DIAGNOSIS — E782 Mixed hyperlipidemia: Secondary | ICD-10-CM | POA: Diagnosis not present

## 2019-08-22 DIAGNOSIS — Z905 Acquired absence of kidney: Secondary | ICD-10-CM | POA: Insufficient documentation

## 2019-08-22 DIAGNOSIS — R0602 Shortness of breath: Secondary | ICD-10-CM | POA: Diagnosis not present

## 2019-08-22 DIAGNOSIS — Z7901 Long term (current) use of anticoagulants: Secondary | ICD-10-CM | POA: Diagnosis not present

## 2019-08-22 DIAGNOSIS — Z8249 Family history of ischemic heart disease and other diseases of the circulatory system: Secondary | ICD-10-CM | POA: Insufficient documentation

## 2019-08-22 DIAGNOSIS — E669 Obesity, unspecified: Secondary | ICD-10-CM | POA: Insufficient documentation

## 2019-08-22 DIAGNOSIS — I10 Essential (primary) hypertension: Secondary | ICD-10-CM | POA: Diagnosis present

## 2019-08-22 DIAGNOSIS — N183 Chronic kidney disease, stage 3 unspecified: Secondary | ICD-10-CM | POA: Diagnosis present

## 2019-08-22 DIAGNOSIS — Z79890 Hormone replacement therapy (postmenopausal): Secondary | ICD-10-CM | POA: Insufficient documentation

## 2019-08-22 LAB — CBC WITH DIFFERENTIAL/PLATELET
Abs Immature Granulocytes: 0.05 10*3/uL (ref 0.00–0.07)
Basophils Absolute: 0.1 10*3/uL (ref 0.0–0.1)
Basophils Relative: 1 %
Eosinophils Absolute: 0.2 10*3/uL (ref 0.0–0.5)
Eosinophils Relative: 2 %
HCT: 47.4 % (ref 39.0–52.0)
Hemoglobin: 15.1 g/dL (ref 13.0–17.0)
Immature Granulocytes: 1 %
Lymphocytes Relative: 25 %
Lymphs Abs: 2.6 10*3/uL (ref 0.7–4.0)
MCH: 30.3 pg (ref 26.0–34.0)
MCHC: 31.9 g/dL (ref 30.0–36.0)
MCV: 95 fL (ref 80.0–100.0)
Monocytes Absolute: 0.7 10*3/uL (ref 0.1–1.0)
Monocytes Relative: 7 %
Neutro Abs: 7.1 10*3/uL (ref 1.7–7.7)
Neutrophils Relative %: 64 %
Platelets: 304 10*3/uL (ref 150–400)
RBC: 4.99 MIL/uL (ref 4.22–5.81)
RDW: 13.6 % (ref 11.5–15.5)
WBC: 10.8 10*3/uL — ABNORMAL HIGH (ref 4.0–10.5)
nRBC: 0 % (ref 0.0–0.2)

## 2019-08-22 LAB — HEMOGLOBIN A1C
Hgb A1c MFr Bld: 7.6 % — ABNORMAL HIGH (ref 4.8–5.6)
Mean Plasma Glucose: 171.42 mg/dL

## 2019-08-22 LAB — I-STAT CHEM 8, ED
BUN: 23 mg/dL (ref 8–23)
Calcium, Ion: 1.1 mmol/L — ABNORMAL LOW (ref 1.15–1.40)
Chloride: 105 mmol/L (ref 98–111)
Creatinine, Ser: 2.1 mg/dL — ABNORMAL HIGH (ref 0.61–1.24)
Glucose, Bld: 140 mg/dL — ABNORMAL HIGH (ref 70–99)
HCT: 47 % (ref 39.0–52.0)
Hemoglobin: 16 g/dL (ref 13.0–17.0)
Potassium: 4 mmol/L (ref 3.5–5.1)
Sodium: 137 mmol/L (ref 135–145)
TCO2: 23 mmol/L (ref 22–32)

## 2019-08-22 LAB — TROPONIN I (HIGH SENSITIVITY)
Troponin I (High Sensitivity): 23 ng/L — ABNORMAL HIGH (ref <18)
Troponin I (High Sensitivity): 13 ng/L (ref <18)

## 2019-08-22 LAB — GLUCOSE, CAPILLARY: Glucose-Capillary: 238 mg/dL — ABNORMAL HIGH (ref 70–99)

## 2019-08-22 LAB — SARS CORONAVIRUS 2 (TAT 6-24 HRS): SARS Coronavirus 2: NEGATIVE

## 2019-08-22 LAB — CBG MONITORING, ED: Glucose-Capillary: 200 mg/dL — ABNORMAL HIGH (ref 70–99)

## 2019-08-22 MED ORDER — LINACLOTIDE 145 MCG PO CAPS
290.0000 ug | ORAL_CAPSULE | Freq: Every day | ORAL | Status: DC | PRN
  Filled 2019-08-22: qty 2

## 2019-08-22 MED ORDER — ISOSORBIDE MONONITRATE ER 30 MG PO TB24: 60.0000 mg | ORAL_TABLET | Freq: Every day | ORAL | Status: DC

## 2019-08-22 MED ORDER — DILTIAZEM LOAD VIA INFUSION
10.0000 mg | Freq: Once | INTRAVENOUS | Status: DC
  Filled 2019-08-22: qty 10

## 2019-08-22 MED ORDER — INSULIN ASPART 100 UNIT/ML ~~LOC~~ SOLN
0.0000 [IU] | Freq: Every day | SUBCUTANEOUS | Status: DC
  Administered 2019-08-22: 2 [IU] via SUBCUTANEOUS

## 2019-08-22 MED ORDER — HYDROXYZINE HCL 10 MG PO TABS
10.0000 mg | ORAL_TABLET | Freq: Every day | ORAL | Status: DC | PRN
  Filled 2019-08-22: qty 1

## 2019-08-22 MED ORDER — BUPROPION HCL ER (XL) 150 MG PO TB24
150.0000 mg | ORAL_TABLET | Freq: Every day | ORAL | Status: DC
  Administered 2019-08-22 – 2019-08-23 (×2): 150 mg via ORAL
  Filled 2019-08-22 (×2): qty 1

## 2019-08-22 MED ORDER — INSULIN ASPART 100 UNIT/ML ~~LOC~~ SOLN: 0.0000 [IU] | Freq: Three times a day (TID) | SUBCUTANEOUS | Status: DC

## 2019-08-22 MED ORDER — FENOFIBRATE 54 MG PO TABS: 54.0000 mg | ORAL_TABLET | Freq: Every day | ORAL | Status: DC

## 2019-08-22 MED ORDER — TRAZODONE HCL 100 MG PO TABS: 100.0000 mg | ORAL_TABLET | Freq: Every day | ORAL | Status: DC

## 2019-08-22 MED ORDER — ONDANSETRON HCL 4 MG/2ML IJ SOLN: 4.0000 mg | Freq: Four times a day (QID) | INTRAMUSCULAR | Status: DC | PRN

## 2019-08-22 MED ORDER — TRAZODONE HCL 50 MG PO TABS: 50.0000 mg | ORAL_TABLET | Freq: Every day | ORAL | Status: DC

## 2019-08-22 MED ORDER — OXYCODONE-ACETAMINOPHEN 10-325 MG PO TABS: 1.0000 | ORAL_TABLET | Freq: Four times a day (QID) | ORAL | Status: DC | PRN

## 2019-08-22 MED ORDER — SERTRALINE HCL 25 MG PO TABS: 25.0000 mg | ORAL_TABLET | ORAL | Status: DC

## 2019-08-22 MED ORDER — ACETAMINOPHEN 325 MG PO TABS: 650.0000 mg | ORAL_TABLET | ORAL | Status: DC | PRN

## 2019-08-22 MED ORDER — NORTRIPTYLINE HCL 25 MG PO CAPS
25.0000 mg | ORAL_CAPSULE | Freq: Every evening | ORAL | Status: DC | PRN
  Filled 2019-08-22: qty 1

## 2019-08-22 MED ORDER — APIXABAN 5 MG PO TABS
5.0000 mg | ORAL_TABLET | Freq: Two times a day (BID) | ORAL | Status: DC
  Administered 2019-08-22 – 2019-08-23 (×3): 5 mg via ORAL
  Filled 2019-08-22 (×4): qty 1

## 2019-08-22 MED ORDER — DILTIAZEM HCL-DEXTROSE 125-5 MG/125ML-% IV SOLN (PREMIX)
5.0000 mg/h | INTRAVENOUS | Status: DC
  Administered 2019-08-22: 5 mg/h via INTRAVENOUS
  Filled 2019-08-22: qty 125

## 2019-08-22 MED ORDER — POLYETHYLENE GLYCOL 3350 17 GM/SCOOP PO POWD
17.0000 g | Freq: Every day | ORAL | Status: DC | PRN
  Filled 2019-08-22: qty 255

## 2019-08-22 MED ORDER — OXYCODONE-ACETAMINOPHEN 5-325 MG PO TABS
1.0000 | ORAL_TABLET | Freq: Four times a day (QID) | ORAL | Status: DC | PRN
  Administered 2019-08-22 – 2019-08-23 (×2): 1 via ORAL
  Filled 2019-08-22 (×2): qty 1

## 2019-08-22 MED ORDER — METOPROLOL SUCCINATE ER 25 MG PO TB24
12.5000 mg | ORAL_TABLET | Freq: Every day | ORAL | Status: DC
  Administered 2019-08-22 – 2019-08-23 (×2): 12.5 mg via ORAL
  Filled 2019-08-22 (×2): qty 1

## 2019-08-22 MED ORDER — LEVOTHYROXINE SODIUM 75 MCG PO TABS
175.0000 ug | ORAL_TABLET | Freq: Every day | ORAL | Status: DC
  Administered 2019-08-23: 175 ug via ORAL
  Filled 2019-08-22: qty 1

## 2019-08-22 MED ORDER — MAGNESIUM OXIDE 400 (241.3 MG) MG PO TABS
400.0000 mg | ORAL_TABLET | Freq: Two times a day (BID) | ORAL | Status: DC
  Administered 2019-08-22 – 2019-08-23 (×2): 400 mg via ORAL
  Filled 2019-08-22 (×6): qty 1

## 2019-08-22 MED ORDER — PANTOPRAZOLE SODIUM 40 MG PO TBEC: 40.0000 mg | DELAYED_RELEASE_TABLET | Freq: Every day | ORAL | Status: DC

## 2019-08-22 MED ORDER — EZETIMIBE 10 MG PO TABS
5.0000 mg | ORAL_TABLET | Freq: Every day | ORAL | Status: DC
  Administered 2019-08-22: 5 mg via ORAL
  Filled 2019-08-22: qty 0.5
  Filled 2019-08-22: qty 1

## 2019-08-22 MED ORDER — KETOROLAC TROMETHAMINE 30 MG/ML IJ SOLN
15.0000 mg | Freq: Once | INTRAMUSCULAR | Status: AC
  Administered 2019-08-22: 15 mg via INTRAVENOUS
  Filled 2019-08-22: qty 1

## 2019-08-22 MED ORDER — FINASTERIDE 5 MG PO TABS
5.0000 mg | ORAL_TABLET | Freq: Every day | ORAL | Status: DC
  Administered 2019-08-22: 5 mg via ORAL
  Filled 2019-08-22 (×2): qty 1

## 2019-08-22 MED ORDER — TAMSULOSIN HCL 0.4 MG PO CAPS
0.4000 mg | ORAL_CAPSULE | Freq: Every day | ORAL | Status: DC
  Administered 2019-08-22 – 2019-08-23 (×2): 0.4 mg via ORAL
  Filled 2019-08-22 (×2): qty 1

## 2019-08-22 MED ORDER — ALPRAZOLAM 0.25 MG PO TABS: 0.2500 mg | ORAL_TABLET | Freq: Every evening | ORAL | Status: DC | PRN

## 2019-08-22 MED ORDER — DRONEDARONE HCL 400 MG PO TABS
400.0000 mg | ORAL_TABLET | Freq: Two times a day (BID) | ORAL | Status: DC
  Administered 2019-08-22 – 2019-08-23 (×2): 400 mg via ORAL
  Filled 2019-08-22 (×6): qty 1

## 2019-08-22 MED ORDER — INSULIN DETEMIR 100 UNIT/ML ~~LOC~~ SOLN
50.0000 [IU] | Freq: Two times a day (BID) | SUBCUTANEOUS | Status: DC
  Administered 2019-08-22 – 2019-08-23 (×4): 50 [IU] via SUBCUTANEOUS
  Filled 2019-08-22 (×5): qty 0.5

## 2019-08-22 MED ORDER — SENNA 8.6 MG PO TABS
1.0000 | ORAL_TABLET | Freq: Two times a day (BID) | ORAL | Status: DC
  Administered 2019-08-22: 8.6 mg via ORAL
  Filled 2019-08-22 (×3): qty 1

## 2019-08-22 MED ORDER — CLOPIDOGREL BISULFATE 75 MG PO TABS
75.0000 mg | ORAL_TABLET | Freq: Every day | ORAL | Status: DC
  Administered 2019-08-23: 75 mg via ORAL
  Filled 2019-08-22 (×2): qty 1

## 2019-08-22 MED ORDER — OXYCODONE HCL 5 MG PO TABS
5.0000 mg | ORAL_TABLET | Freq: Four times a day (QID) | ORAL | Status: DC | PRN
  Administered 2019-08-22 – 2019-08-23 (×3): 5 mg via ORAL
  Filled 2019-08-22 (×3): qty 1

## 2019-08-22 MED ORDER — GABAPENTIN 300 MG PO CAPS
300.0000 mg | ORAL_CAPSULE | Freq: Two times a day (BID) | ORAL | Status: DC
  Administered 2019-08-22 – 2019-08-23 (×2): 300 mg via ORAL
  Filled 2019-08-22 (×2): qty 1

## 2019-08-22 MED ORDER — ROSUVASTATIN CALCIUM 20 MG PO TABS
20.0000 mg | ORAL_TABLET | Freq: Every day | ORAL | Status: DC
  Administered 2019-08-22: 20 mg via ORAL
  Filled 2019-08-22: qty 1

## 2019-08-22 NOTE — ED Notes (Signed)
Attempted to ambulate pt per verbal order from Dr. Lorin Mercy; pt ambulated a few steps outside room, became dizzy; pt returned to stretcher, states he will try again later; HR 80's, NSR; pt given food and beverage; will continue to monitor and re-attempt ambulation after pt has eaten

## 2019-08-22 NOTE — ED Triage Notes (Signed)
Patient arrived from home via EMS with intermittent 6/10 CP, SOB, N/V, dizziness increased with movement. EKG afib 90's-150's on eliquis. EMS administered 300cc fluid, 64m zofran.

## 2019-08-22 NOTE — Procedures (Signed)
Patient declined CPAP at this time

## 2019-08-22 NOTE — H&P (Signed)
History and Physical    Matthew [name] YVO:[id number] DOB: 14-Jan-1946 DOA: 08/22/2019  PCP: Marda Stalker, PA-C; [facility] Consultants:  [facility] - cardiology; [facility] - neurology Patient coming from:  Home - lives with wife; NOK: Wife, Matthew [name], [phone]  Chief Complaint: chest pain/dizziness  HPI: Matthew [name] is a 74 y.o. male with medical history significant of OSA on CPAP; RCC s/p nephrectomy; obesity (BMI 48); CAD s/p CABG; lung CA; afib on Eliquis; hypothyroidism; HTN; HLD; DM; and stage 3 CKD presenting with chest pain/dizziness.  He reports that "I didn't feel right and my heart was racing fast."  He checked it and it read 45 but he knew that wasn't right. Mild heaviness that improved with NTG.  He feels ok now, but has not stood up.  He is currently in sinus on the monitor.  He prefers to go home, if possible.  "I feel alright now."    His wife reports that he is service connected for [place], also has fatty liver disease/cirrhosis.  He started having abdominal discomfort about a week ago.  His wife reports that he will try to talk his way out of the hospital now that he is feeling better but she is concerned about his dizziness and risk of falls.   ED Course:  Carryover, per Dr. Marlowe Sax:  Patient presenting with a 3-day history of chest pain, shortness of breath, nausea, vomiting, and diaphoresis. Has history of paroxysmal A. fib and now found to be in A. fib with RVR. Troponin only mildly elevated and his presentation is not felt to be due to ACS. Chest x-ray clear. Baseline creatinine 1.8, now up to 2.1. White count 10.8. Covid test pending. Started on Cardizem infusion and now rate improved.  Review of Systems: As per HPI; otherwise review of systems reviewed and negative.   Ambulatory Status:  Ambulates without assistance  Past Medical History:  Diagnosis Date  . AAA (abdominal aortic aneurysm) ([place])    a. Aortic US 05/2012: 3.9 cm fusiform abdominal aortic  aneurysm. F/u recommended 05/2014.  Marland Kitchen Anemia   . Arthritis    DDD  . Atrial fibrillation ([place])    a. Post-op CABG 05/2009. b. Re-identified 12/2013.   Marland Kitchen CAD (coronary artery disease)    a. s/p CABGx4 05/2009. 10/10/16 PCI with DES x1 SVG-->OM2/OM3  . Chronic constipation   . CKD (chronic kidney disease), stage III   . Cocaine abuse in remission ([place])   . Depression   . Diabetes mellitus   . Empyema lung ([place])    a. PNA 02/2011 c/b empyema requiring chest tube, drainage of pleural effusion and decortication.  . Frequent falls   . GERD (gastroesophageal reflux disease)   . Hepatic steatosis   . Hyperlipidemia   . Hypertension   . Hypothyroidism   . Iron deficiency anemia   . LAFB (left anterior fascicular block)   . Lung cancer ([place])    a. Tx with interleukin in 2002 per patient.  . Nodule of left lung    a. Cavitary nodules 02/2011, f/u recommended.  . Obesity   . Post concussion syndrome   . RBBB   . Renal cell carcinoma    a. s/p R nephrectomy 2002. Reported pulmonary mets per note at that time.  . Scrotal abscess    a. 2012 2/2 Streptococcus group C, status post incision and drainage.  . Sleep apnea    wears CPAP  . Ulcerative proctitis ([place])    a. Remote dx dating back  to 1979.  . Vitamin D deficiency   . Wears dentures     Past Surgical History:  Procedure Laterality Date  . Bronchoscopy, right video-assisted thoracoscopy, minithoracotomy, drainage of pleural effusion, and decortication.  02/26/2011   [facility]  . CABG x 4  05/13/2009   [facility]  . CARDIAC SURGERY    . COLONOSCOPY W/ BIOPSIES AND POLYPECTOMY    . CORONARY STENT INTERVENTION N/A 10/10/2016   Procedure: Coronary Stent Intervention;  Surgeon: Troy Sine, MD;  Location: [facility];  Service: Cardiovascular;  Laterality: N/A;  . CORONARY STENT INTERVENTION N/A 10/15/2018   Procedure: CORONARY STENT INTERVENTION;  Surgeon: Burnell Blanks, MD;  Location: [facility];  Service:  Cardiovascular;  Laterality: N/A;  . HEMORRHOID SURGERY    . HERNIA REPAIR    . Incision, drainage and debridement of perineum and  scrotum    . Irrigation and debridement of scrotal wound and closure of scrotal wound.    Marland Kitchen LEFT HEART CATH AND CORS/GRAFTS ANGIOGRAPHY N/A 10/10/2016   Procedure: Left Heart Cath and Cors/Grafts Angiography;  Surgeon: Troy Sine, MD;  Location: [facility];  Service: Cardiovascular;  Laterality: N/A;  . LEFT HEART CATH AND CORS/GRAFTS ANGIOGRAPHY N/A 10/15/2018   Procedure: LEFT HEART CATH AND CORS/GRAFTS ANGIOGRAPHY;  Surgeon: Burnell Blanks, MD;  Location: [facility];  Service: Cardiovascular;  Laterality: N/A;  . MULTIPLE TOOTH EXTRACTIONS    . RADIOLOGY WITH ANESTHESIA N/A 05/09/2017   Procedure: MRI WITH ANESTHESIA   BRAIN WITHOUT CONTRAST;  Surgeon: Radiologist, Medication, MD;  Location: [facility];  Service: Radiology;  Laterality: N/A;  . Right radical nephrectomy.    . TOOTH EXTRACTION      Social History   Socioeconomic History  . Marital status: Married    Spouse name: Not on file  . Number of children: 4  . Years of education: Not on file  . Highest education level: Not on file  Occupational History  . Occupation: CAR SERVICE    Employer: SELF EMPLOYED  Tobacco Use  . Smoking status: Never Smoker  . Smokeless tobacco: Never Used  Substance and Sexual Activity  . Alcohol use: No  . Drug use: Yes    Types: Cocaine    Comment: stopped cocaine maybe 10 years ago  . Sexual activity: Not on file  Other Topics Concern  . Not on file  Social History Narrative  . Not on file   Social Determinants of Health   Financial Resource Strain:   . Difficulty of Paying Living Expenses:   Food Insecurity:   . Worried About Charity fundraiser in the Last Year:   . Arboriculturist in the Last Year:   Transportation Needs:   . Film/video editor (Medical):   Marland Kitchen Lack of Transportation (Non-Medical):   Physical Activity:     . Days of Exercise per Week:   . Minutes of Exercise per Session:   Stress:   . Feeling of Stress :   Social Connections:   . Frequency of Communication with Friends and Family:   . Frequency of Social Gatherings with Friends and Family:   . Attends Religious Services:   . Active Member of Clubs or Organizations:   . Attends Archivist Meetings:   Marland Kitchen Marital Status:   Intimate Partner Violence:   . Fear of Current or Ex-Partner:   . Emotionally Abused:   Marland Kitchen Physically Abused:   .  Sexually Abused:     Allergies  Allergen Reactions  . Amiodarone Other (See Comments)    Caused lung and liver toxicity  . Tetanus Toxoids Hives    Family History  Problem Relation Age of Onset  . Heart disease Father   . Alzheimer's disease Mother   . Pneumonia Other   . Colon cancer Neg Hx   . Esophageal cancer Neg Hx   . Stomach cancer Neg Hx   . Rectal cancer Neg Hx     Prior to Admission medications   Medication Sig Start Date End Date Taking? Authorizing Provider  ALPRAZolam (XANAX) 0.25 MG tablet Take 1 tablet (0.25 mg total) by mouth at bedtime as needed for anxiety. Patient taking differently: Take 0.25 mg by mouth at bedtime as needed for anxiety or sleep.  08/22/18   Sater, Nanine Means, MD  apixaban (ELIQUIS) 5 MG TABS tablet Take 5 mg by mouth 2 (two) times daily.    [provider]  buPROPion (WELLBUTRIN XL) 150 MG 24 hr tablet Take 150 mg by mouth daily.    [provider]  capsaicin (ZOSTRIX) 0.025 % cream Apply 1 application topically 2 (two) times daily as needed (for pain).     [provider]  Cholecalciferol (D3-1000) 25 MCG (1000 UT) tablet Take 1,000 Units by mouth at bedtime.    [provider]  clopidogrel (PLAVIX) 75 MG tablet Take 1 tablet (75 mg total) by mouth daily with breakfast. 10/16/18   Rai, Ripudeep K, MD  clotrimazole (LOTRIMIN) 1 % cream Apply 1 application topically 2 (two) times daily as needed (for rash).      [provider]  dronedarone (MULTAQ) 400 MG tablet Take 400 mg by mouth 2 (two) times daily with a meal.    [provider]  ezetimibe (ZETIA) 10 MG tablet Take 5 mg by mouth at bedtime.    [provider]  fenofibrate 54 MG tablet Take 54 mg by mouth at bedtime.    [provider]  ferrous sulfate 325 (65 FE) MG tablet Take 325 mg by mouth 3 (three) times a week. Tuesday, Wednesday and Friday    [provider]  finasteride (PROSCAR) 5 MG tablet Take 5 mg by mouth at bedtime.     [provider]  gabapentin (NEURONTIN) 300 MG capsule Take 300 mg by mouth 2 (two) times daily.     [provider]  glucose 4 GM chewable tablet Chew 1 tablet by mouth as needed for low blood sugar.    [provider]  hydrOXYzine (ATARAX/VISTARIL) 10 MG tablet Take 10 mg by mouth daily as needed for itching. 08/07/18   [provider]  ibuprofen (ADVIL) 200 MG tablet Take 200 mg by mouth every 8 (eight) hours as needed (for gout pain).    [provider]  insulin aspart (NOVOLOG FLEXPEN) 100 UNIT/ML FlexPen Inject 10-40 Units into the skin 2 (two) times daily after a meal. Lunch and Dinner(evening meal)    [provider]  Insulin Detemir (LEVEMIR FLEXPEN) 100 UNIT/ML Pen Inject 50 Units into the skin 2 (two) times daily.     [provider]  isosorbide mononitrate (IMDUR) 60 MG 24 hr tablet Take 1 tablet (60 mg total) by mouth daily. Hold if blood pressure <100 07/30/17   Annita Brod, MD  ketoconazole (NIZORAL) 2 % cream Apply 1 application topically daily as needed for irritation (or rashes).     [provider]  levothyroxine (  SYNTHROID) 175 MCG tablet Take 175 mcg by mouth daily before breakfast.    [provider]  LIDOCAINE EX Apply 1 application topically 2 (two) times daily as needed (for pain).     [provider]  linaclotide (LINZESS) 290 MCG CAPS capsule Take 290 mcg by  mouth daily as needed (for constipation).    [provider]  Magnesium Oxide 420 MG TABS Take 420 mg by mouth 2 (two) times a day.     [provider]  MENTHOL-METHYL SALICYLATE EX Apply 1 application topically as needed (pain).    [provider]  mesalamine (CANASA) 1000 MG suppository Place 1,000 mg rectally at bedtime as needed (ulcerative colitis flares).     [provider]  mesalamine (LIALDA) 1.2 g EC tablet Take 1.2 g by mouth daily as needed (ulcerative colitis flares).    [provider]  methylPREDNISolone (MEDROL) 4 MG tablet Take 4 mg by mouth 2 (two) times daily as needed (gout flares).     [provider]  metoprolol succinate (TOPROL-XL) 25 MG 24 hr tablet Take 12.5 mg by mouth daily.    [provider]  mupirocin ointment (BACTROBAN) 2 % Place 1 application into the nose daily as needed (for mersa).     [provider]  neomycin-bacitracin-polymyxin (NEOSPORIN) ointment Apply 1 application topically as needed for wound care.    [provider]  nitroGLYCERIN (NITROSTAT) 0.4 MG SL tablet Place 0.4 mg under the tongue every 5 (five) minutes x 3 doses as needed for chest pain.     [provider]  nortriptyline (PAMELOR) 25 MG capsule Take 25 mg by mouth at bedtime as needed for headache.    [provider]  nystatin cream (MYCOSTATIN) Apply 1 application topically daily as needed for dry skin (or rash).     [provider]  Omega-3 Fatty Acids (FISH OIL) 1000 MG CAPS Take 3,000 mg by mouth 2 (two) times daily.     [provider]  OVER THE COUNTER MEDICATION "[place]" chewable for acid reflux= Chew 1-2 tablets by mouth after meals or at bedtime as needed for indigestion    [provider]  oxyCODONE-acetaminophen (PERCOCET) 10-325 MG per tablet Take 1 tablet by mouth every 6 (six) hours as needed for pain.     [provider]  RABEprazole (ACIPHEX) 20 MG  tablet Take 40 mg by mouth 2 (two) times daily before a meal.     [provider]  rosuvastatin (CRESTOR) 40 MG tablet Take 20 mg by mouth at bedtime.     [provider]  senna (SENOKOT) 8.6 MG TABS tablet Take 1-2 tablets by mouth See admin instructions. Take 2 tablets by mouth in the morning and 1 tablet at bedtime    [provider]  silver sulfADIAZINE (SILVADENE) 1 % cream Apply 1 application topically daily as needed (skin care).     [provider]  sucralfate (CARAFATE) 1 GM/10ML suspension Take 10 mLs (1 g total) by mouth 4 (four) times daily -  with meals and at bedtime for 30 days. 10/16/18 11/15/18  Rai, Ripudeep K, MD  terbinafine (LAMISIL) 1 % cream Apply 1 application topically daily as needed (skin care).     [provider]  traZODone (DESYREL) 50 MG tablet Take 1 tablet (50 mg total) by mouth at bedtime. 02/21/18   Sater, Nanine Means, MD  triamcinolone cream (KENALOG) 0.1 % Apply 1 application topically daily as needed (rash).  [provider]  urea 10 % lotion Apply 1 application topically as needed for dry skin.    [provider]    Physical Exam: Vitals:   08/22/19 0631 08/22/19 0656 08/22/19 0700 08/22/19 0812  BP: 101/61 (!) 129/111 126/79 112/76  Pulse: 82 71 71 74  Resp:   20 19  Temp:      TempSrc:      SpO2: 99% 100% 100% 99%  Weight:      Height:         . General:  Appears calm and comfortable and is NAD . Eyes:  PERRL, EOMI, normal lids, iris . ENT:  grossly normal hearing, lips & tongue, mmm . Neck:  no LAD, masses or thyromegaly . Cardiovascular:  RRR, no m/r/g. No LE edema.  Marland Kitchen Respiratory:   CTA bilaterally with no wheezes/rales/rhonchi.  Normal respiratory effort. . Abdomen:  soft, NT, ND, NABS . Skin:  no rash or induration seen on limited exam . Musculoskeletal:  grossly normal tone BUE/BLE, good ROM, no bony abnormality . Psychiatric:  grossly normal mood and affect, speech fluent and  appropriate, AOx3 . Neurologic:  CN 2-12 grossly intact, moves all extremities in coordinated fashion    Radiological Exams on Admission: DG Chest Portable 1 View  Result Date: 08/22/2019 CLINICAL DATA:  Chest pain, shortness of breath, dizziness EXAM: PORTABLE CHEST 1 VIEW COMPARISON:  10/13/2018 FINDINGS: Stable right lower lobe/infrahilar scarring. Left lung is essentially clear. No pleural effusion or pneumothorax. Heart is normal in size. Postsurgical changes related to prior CABG. Coronary stent. Median sternotomy. IMPRESSION: No evidence of acute cardiopulmonary disease. Electronically Signed   By: Julian Hy M.D.   On: 08/22/2019 04:35    EKG: Independently reviewed.  Afib with rate 115; RBBB with no evidence of acute ischemia   Labs on Admission: I have personally reviewed the available labs and imaging studies at the time of the admission.  Pertinent labs:   Glucose 140 BUN 23/Creatinine 2.10, baseline creatinine 1.8-1.9 HS troponin 23, 13 WBC 10.8 COVID pending   Assessment/Plan Principal Problem:   Atrial fibrillation with RVR (HCC) Active Problems:   Hypertension   Hypothyroidism   Mixed hyperlipidemia   CKD (chronic kidney disease), stage III   H/O: lung cancer   AAA (abdominal aortic aneurysm) ([facility])   Type 2 diabetes mellitus with vascular disease ([facility])   OSA (obstructive sleep apnea)   Atrial Fibrillation with h/o CAD -Patient presenting with recurrent afib, with RVR -He was started on Diltiazem and converted back to NSR -Unfortunately, his dizziness with ambulation did not resolve so will observe overnight on telemetry -PT/OT consults requested  -HS troponin negative on repeat; initial mild elevation was likely associated with demand ischemia. -Continue Plavix, Imdur -Heart rate is well controlled on Multaq, will continue. -CHA2DS2-VASc Score is >2; continue Eliquis.  HTN -Continue Toprol XL  HLD -Continue Zetia, fenofibrate, Crestor -Hold  fish oil due to limited inpatient utility  DM -Will check A1c -Continue Levemir -Cover with moderate-scale SSI  Stage 3b CKD -Appears to be at or near baseline  Hypothyroidism -Check TSH -Continue Synthroid at current dose for now  OSA -Continue CPAP  Depression/anxiety -Continue Xanax, Wellbutrin, hydroxyzine, nortriptyline    Note: This patient has been tested and is pending for the novel coronavirus COVID-19.    DVT prophylaxis: Lovenox  Code Status:  Full - confirmed with patient Family Communication: None present; I spoke with the patient's wife at the time of admission. Disposition Plan:  He is anticipated to d/c to home once his cardiology issues have been resolved; PT/OT consults are pending to determine if [facility] services may be beneficial. Consults called: PT/OT Admission status: It is my clinical opinion that referral for OBSERVATION is reasonable and necessary in this patient based on the above information provided. The aforementioned taken together are felt to place the patient at high risk for further clinical deterioration. However it is anticipated that the patient may be medically stable for discharge from the hospital within 24 to 48 hours.     Karmen Bongo MD [facility]   How to contact the [facility] Attending or Consulting provider [place] or covering provider during after hours [place], for this patient?  1. Check the care team in [facility] and look for a) attending/consulting TR[facility] provider listed and b) the [facility] team listed 2. Log into [email or web address] and use [place]'s universal password to access. If you do not have the password, please contact the hospital operator. 3. Locate the [facility] provider you are looking for under [facility] and page to a number that you can be directly reached. 4. If you still have difficulty reaching the provider, please page the [facility] (Director on Call) for the Hospitalists listed on amion for assistance.   08/22/2019, 10:30  AM

## 2019-08-22 NOTE — Progress Notes (Signed)
Patients multaq was requested from pharmacy for evening dose, has yet to be received. Will inform night shift to administer when it becomes available.

## 2019-08-22 NOTE — ED Notes (Signed)
[place] pts wife would like an update

## 2019-08-22 NOTE — ED Provider Notes (Signed)
[facility] EMERGENCY DEPARTMENT Provider Note   CSN: [id number] Arrival date & time: 08/22/19  0403     History Chief Complaint  Patient presents with  . Chest Pain    Matthew [name] is a 74 y.o. male.  The history is provided by the patient.  Chest Pain Pain location:  Substernal area Pain radiates to:  Does not radiate Pain severity:  Moderate Onset quality:  Gradual Duration:  3 days Timing:  Constant Progression:  Unchanged Chronicity:  Recurrent Context: at rest   Relieved by:  Nothing Worsened by:  Nothing Ineffective treatments:  None tried Associated symptoms: diaphoresis, nausea, palpitations, shortness of breath and vomiting   Associated symptoms: no cough and no dizziness   Risk factors: coronary artery disease and male sex   Patient with CAD and AFIB presents with CP and SOB since Wednesday.       Past Medical History:  Diagnosis Date  . AAA (abdominal aortic aneurysm) ([place])    a. Aortic US 05/2012: 3.9 cm fusiform abdominal aortic aneurysm. F/u recommended 05/2014.  Marland Kitchen Anemia   . Arthritis    DDD  . Atrial fibrillation ([place])    a. Post-op CABG 05/2009. b. Re-identified 12/2013.   Marland Kitchen CAD (coronary artery disease)    a. s/p CABGx4 05/2009. 10/10/16 PCI with DES x1 SVG-->OM2/OM3  . Chronic constipation   . CKD (chronic kidney disease), stage III   . Cocaine abuse in remission ([place])   . Colon polyps   . Depression   . Diabetes mellitus   . Empyema lung ([place])    a. PNA 02/2011 c/b empyema requiring chest tube, drainage of pleural effusion and decortication.  . Enlarged prostate   . Frequent falls   . GERD (gastroesophageal reflux disease)   . Headache   . Hepatic steatosis   . Hyperlipidemia   . Hypertension   . Hypothyroidism   . Iron deficiency anemia   . LAFB (left anterior fascicular block)   . Lung cancer ([place])    a. Tx with interleukin in 2002 per patient.  . Myocardial infarction ([place])    10/08/16  . Nodule of left  lung    a. Cavitary nodules 02/2011, f/u recommended.  . Obesity   . Paroxysmal supraventricular tachycardia ([place])    a. While inpatient 02/2011 sick with empyema - wide appearace due to RBBB.  . Pneumonia   . Post concussion syndrome   . RBBB   . Renal cell carcinoma    a. s/p R nephrectomy 2002. Reported pulmonary mets per note at that time.  . Scrotal abscess    a. 2012 2/2 Streptococcus group C, status post incision and drainage.  . Sleep apnea    wears CPAP  . 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  . Vitamin D deficiency   . Wears dentures     Patient Active Problem List   Diagnosis Date Noted  . Ischemic chest pain ([place]) 10/14/2018  . OSA (obstructive sleep apnea) 08/22/2018  . Type 2 diabetes mellitus with vascular disease ([place]) 07/29/2017  . Post concussion syndrome 03/13/2017  . Post-concussion headache 03/13/2017  . Neck pain 03/13/2017  . Insomnia 03/13/2017  . Diabetes, polyneuropathy ([place]) 03/13/2017  . Unstable angina ([place])   . Non-ST elevation (NSTEMI) myocardial infarction ([place]) 10/08/2016  . Atrial fibrillation with RVR ([place]) 01/26/2016  . Atrial fibrillation with rapid ventricular response ([place]) 01/26/2016  . PAF (paroxysmal atrial fibrillation) ([place]) 12/21/2013  .  Chronic abdominal pain 12/21/2013  . CKD (chronic kidney disease), stage III 12/21/2013  . H/O: lung cancer 12/21/2013  . LV dysfunction 12/21/2013  . RBBB   . LAFB (left anterior fascicular block)   . AAA (abdominal aortic aneurysm) ([place])   . Chest pain 12/20/2013  . Mixed hyperlipidemia 10/14/2013  . Obesity, unspecified 10/14/2013  . Right Empyema    .  Left upper lobe lesion or nodule   . Hypertension   . CAD (coronary artery disease)   . Renal cell carcinoma ([place])   . [place]   . Paroxysmal supraventricular tachycardia ([place])   . Hypothyroidism     Past Surgical History:  Procedure Laterality Date  . Bronchoscopy, right video-assisted thoracoscopy,  minithoracotomy, drainage of pleural effusion, and decortication.  02/26/2011   [place]  . CABG x 4  05/13/2009   [place]  . CARDIAC SURGERY    . COLONOSCOPY W/ BIOPSIES AND POLYPECTOMY    . CORONARY STENT INTERVENTION N/A 10/10/2016   Procedure: Coronary Stent Intervention;  Surgeon: Troy Sine, MD;  Location: [facility] CV LAB;  Service: Cardiovascular;  Laterality: N/A;  . CORONARY STENT INTERVENTION N/A 10/15/2018   Procedure: CORONARY STENT INTERVENTION;  Surgeon: Burnell Blanks, MD;  Location: [place] [facility] CV LAB;  Service: Cardiovascular;  Laterality: N/A;  . HEMORRHOID SURGERY    . HERNIA REPAIR    . Incision, drainage and debridement of perineum and  scrotum    . Irrigation and debridement of scrotal wound and closure of scrotal wound.    Marland Kitchen LEFT HEART CATH AND CORS/GRAFTS ANGIOGRAPHY N/A 10/10/2016   Procedure: Left Heart Cath and Cors/Grafts Angiography;  Surgeon: Troy Sine, MD;  Location: [facility] CV LAB;  Service: Cardiovascular;  Laterality: N/A;  . LEFT HEART CATH AND CORS/GRAFTS ANGIOGRAPHY N/A 10/15/2018   Procedure: LEFT HEART CATH AND CORS/GRAFTS ANGIOGRAPHY;  Surgeon: Burnell Blanks, MD;  Location: [facility] CV LAB;  Service: Cardiovascular;  Laterality: N/A;  . MULTIPLE TOOTH EXTRACTIONS    . RADIOLOGY WITH ANESTHESIA N/A 05/09/2017   Procedure: MRI WITH ANESTHESIA   BRAIN WITHOUT CONTRAST;  Surgeon: Radiologist, Medication, MD;  Location: [facility];  Service: Radiology;  Laterality: N/A;  . Right radical nephrectomy.    . TOOTH EXTRACTION         Family History  Problem Relation Age of Onset  . Heart disease Father   . Alzheimer's disease Mother   . Pneumonia Other   . Colon cancer Neg Hx   . Esophageal cancer Neg Hx   . Stomach cancer Neg Hx   . Rectal cancer Neg Hx     Social History   Tobacco Use  . Smoking status: Never Smoker  . Smokeless tobacco: Never Used  Substance Use Topics  . Alcohol use: No  . Drug use: Yes     Types: Cocaine    Comment: stopped cocaine 2 and 1/2 years ago    Home Medications Prior to Admission medications   Medication Sig Start Date End Date Taking? Authorizing Provider  ALPRAZolam (XANAX) 0.25 MG tablet Take 1 tablet (0.25 mg total) by mouth at bedtime as needed for anxiety. Patient taking differently: Take 0.25 mg by mouth at bedtime as needed for anxiety or sleep.  08/22/18   Sater, Nanine Means, MD  apixaban (ELIQUIS) 5 MG TABS tablet Take 5 mg by mouth 2 (two) times daily.    [provider]  buPROPion (WELLBUTRIN XL) 150 MG 24 hr tablet Take 150 mg  by mouth daily.    [provider]  capsaicin (ZOSTRIX) 0.025 % cream Apply 1 application topically 2 (two) times daily as needed (for pain).     [provider]  Cholecalciferol (D3-1000) 25 MCG (1000 UT) tablet Take 1,000 Units by mouth at bedtime.    [provider]  clopidogrel (PLAVIX) 75 MG tablet Take 1 tablet (75 mg total) by mouth daily with breakfast. 10/16/18   Rai, Ripudeep K, MD  clotrimazole (LOTRIMIN) 1 % cream Apply 1 application topically 2 (two) times daily as needed (for rash).     [provider]  dronedarone (MULTAQ) 400 MG tablet Take 400 mg by mouth 2 (two) times daily with a meal.    [provider]  ezetimibe (ZETIA) 10 MG tablet Take 5 mg by mouth at bedtime.    [provider]  fenofibrate 54 MG tablet Take 54 mg by mouth at bedtime.    [provider]  ferrous sulfate 325 (65 FE) MG tablet Take 325 mg by mouth 3 (three) times a week. Tuesday, Wednesday and Friday    [provider]  finasteride (PROSCAR) 5 MG tablet Take 5 mg by mouth at bedtime.     [provider]  gabapentin (NEURONTIN) 300 MG capsule Take 300 mg by mouth 2 (two) times daily.     [provider]  glucose 4 GM chewable tablet Chew 1 tablet by mouth as needed for low blood sugar.    [provider]  hydrOXYzine (ATARAX/VISTARIL) 10 MG  tablet Take 10 mg by mouth daily as needed for itching. 08/07/18   [provider]  ibuprofen (ADVIL) 200 MG tablet Take 200 mg by mouth every 8 (eight) hours as needed (for gout pain).    [provider]  insulin aspart (NOVOLOG FLEXPEN) 100 UNIT/ML FlexPen Inject 10-40 Units into the skin 2 (two) times daily after a meal. Lunch and Dinner(evening meal)    [provider]  Insulin Detemir (LEVEMIR FLEXPEN) 100 UNIT/ML Pen Inject 50 Units into the skin 2 (two) times daily.     [provider]  isosorbide mononitrate (IMDUR) 60 MG 24 hr tablet Take 1 tablet (60 mg total) by mouth daily. Hold if blood pressure <100 07/30/17   Annita Brod, MD  ketoconazole (NIZORAL) 2 % cream Apply 1 application topically daily as needed for irritation (or rashes).     [provider]  levothyroxine (SYNTHROID) 175 MCG tablet Take 175 mcg by mouth daily before breakfast.    [provider]  LIDOCAINE EX Apply 1 application topically 2 (two) times daily as needed (for pain).     [provider]  linaclotide (LINZESS) 290 MCG CAPS capsule Take 290 mcg by mouth daily as needed (for constipation).    [provider]  Magnesium Oxide 420 MG TABS Take 420 mg by mouth 2 (two) times a day.     [provider]  MENTHOL-METHYL SALICYLATE EX Apply 1 application topically as needed (pain).    [provider]  mesalamine (CANASA) 1000 MG suppository Place 1,000 mg rectally at bedtime as needed (ulcerative colitis flares).     [provider]  mesalamine (LIALDA) 1.2 g EC tablet Take 1.2 g by mouth daily as needed (ulcerative colitis flares).    [provider]  methylPREDNISolone (MEDROL) 4 MG tablet Take 4 mg by mouth 2 (two) times daily as needed (gout flares).     [provider]  metoprolol succinate (TOPROL-XL)  25 MG 24 hr tablet Take 12.5 mg by mouth daily.    [provider]  mupirocin ointment  (BACTROBAN) 2 % Place 1 application into the nose daily as needed (for mersa).     [provider]  neomycin-bacitracin-polymyxin (NEOSPORIN) ointment Apply 1 application topically as needed for wound care.    [provider]  nitroGLYCERIN (NITROSTAT) 0.4 MG SL tablet Place 0.4 mg under the tongue every 5 (five) minutes x 3 doses as needed for chest pain.     [provider]  nortriptyline (PAMELOR) 25 MG capsule Take 25 mg by mouth at bedtime as needed for headache.    [provider]  nystatin cream (MYCOSTATIN) Apply 1 application topically daily as needed for dry skin (or rash).     [provider]  Omega-3 Fatty Acids (FISH OIL) 1000 MG CAPS Take 3,000 mg by mouth 2 (two) times daily.     [provider]  OVER THE COUNTER MEDICATION "[place]" chewable for acid reflux= Chew 1-2 tablets by mouth after meals or at bedtime as needed for indigestion    [provider]  oxyCODONE-acetaminophen (PERCOCET) 10-325 MG per tablet Take 1 tablet by mouth every 6 (six) hours as needed for pain.     [provider]  RABEprazole (ACIPHEX) 20 MG tablet Take 40 mg by mouth 2 (two) times daily before a meal.     [provider]  rosuvastatin (CRESTOR) 40 MG tablet Take 20 mg by mouth at bedtime.     [provider]  senna (SENOKOT) 8.6 MG TABS tablet Take 1-2 tablets by mouth See admin instructions. Take 2 tablets by mouth in the morning and 1 tablet at bedtime    [provider]  silver sulfADIAZINE (SILVADENE) 1 % cream Apply 1 application topically daily as needed (skin care).     [provider]  sucralfate (CARAFATE) 1 GM/10ML suspension Take 10 mLs (1 g total) by mouth 4 (four) times daily -  with meals and at bedtime for 30 days. 10/16/18 11/15/18  Rai, Ripudeep K, MD  terbinafine (LAMISIL) 1 % cream Apply 1 application topically daily as needed (skin care).     [provider]  traZODone (DESYREL)  50 MG tablet Take 1 tablet (50 mg total) by mouth at bedtime. 02/21/18   Sater, Nanine Means, MD  triamcinolone cream (KENALOG) 0.1 % Apply 1 application topically daily as needed (rash).    [provider]  urea 10 % lotion Apply 1 application topically as needed for dry skin.    [provider]    Allergies    Amiodarone and Tetanus toxoids  Review of Systems   Review of Systems  Constitutional: Positive for diaphoresis.  Eyes: Negative for visual disturbance.  Respiratory: Positive for shortness of breath. Negative for cough.   Cardiovascular: Positive for chest pain and palpitations.  Gastrointestinal: Positive for nausea and vomiting.  Genitourinary: Negative for difficulty urinating.  Musculoskeletal: Negative for arthralgias.  Skin: Negative for rash.  Neurological: Negative for dizziness.  Psychiatric/Behavioral: Negative for agitation.  All other systems reviewed and are negative.   Physical Exam Updated Vital Signs BP 109/80   Pulse (!) 39   Temp 97.6 F (36.4 C) (Oral)   Resp 16   Ht 6' (1.829 m)   Wt 127 kg   SpO2 96%   BMI 37.97 kg/m   Physical Exam Vitals and nursing note reviewed.  Constitutional:      General: He is  not in acute distress.    Appearance: Normal appearance.     Comments: Appears somnolent  HENT:     Head: Normocephalic and atraumatic.     Nose: Nose normal.  Eyes:     Conjunctiva/sclera: Conjunctivae normal.     Pupils: Pupils are equal, round, and reactive to light.  Cardiovascular:     Rate and Rhythm: Tachycardia present. Rhythm irregular.     Pulses: Normal pulses.     Heart sounds: Normal heart sounds.  Pulmonary:     Effort: Pulmonary effort is normal.     Breath sounds: Normal breath sounds.  Abdominal:     General: Abdomen is flat. Bowel sounds are normal.     Tenderness: There is no abdominal tenderness. There is no guarding or rebound.  Musculoskeletal:        General: Normal range of motion.      Cervical back: Normal range of motion and neck supple.  Skin:    General: Skin is warm and dry.     Capillary Refill: Capillary refill takes less than 2 seconds.  Neurological:     General: No focal deficit present.     Mental Status: He is alert and oriented to person, place, and time.  Psychiatric:        Thought Content: Thought content normal.     ED Results / Procedures / Treatments   Labs (all labs ordered are listed, but only abnormal results are displayed) Results for orders placed or performed during the hospital encounter of 08/22/19  CBC with Differential/Platelet  Result Value Ref Range   WBC 10.8 (H) 4.0 - 10.5 K/uL   RBC 4.99 4.22 - 5.81 MIL/uL   Hemoglobin 15.1 13.0 - 17.0 g/dL   HCT 47.4 39.0 - 52.0 %   MCV 95.0 80.0 - 100.0 fL   MCH 30.3 26.0 - 34.0 pg   MCHC 31.9 30.0 - 36.0 g/dL   RDW 13.6 11.5 - 15.5 %   Platelets 304 150 - 400 K/uL   nRBC 0.0 0.0 - 0.2 %   Neutrophils Relative % 64 %   Neutro Abs 7.1 1.7 - 7.7 K/uL   Lymphocytes Relative 25 %   Lymphs Abs 2.6 0.7 - 4.0 K/uL   Monocytes Relative 7 %   Monocytes Absolute 0.7 0.1 - 1.0 K/uL   Eosinophils Relative 2 %   Eosinophils Absolute 0.2 0.0 - 0.5 K/uL   Basophils Relative 1 %   Basophils Absolute 0.1 0.0 - 0.1 K/uL   Immature Granulocytes 1 %   Abs Immature Granulocytes 0.05 0.00 - 0.07 K/uL  I-stat chem 8, ED (not at [facility] or [facility])  Result Value Ref Range   Sodium 137 135 - 145 mmol/L   Potassium 4.0 3.5 - 5.1 mmol/L   Chloride 105 98 - 111 mmol/L   BUN 23 8 - 23 mg/dL   Creatinine, Ser 2.10 (H) 0.61 - 1.24 mg/dL   Glucose, Bld 140 (H) 70 - 99 mg/dL   Calcium, Ion 1.10 (L) 1.15 - 1.40 mmol/L   TCO2 23 22 - 32 mmol/L   Hemoglobin 16.0 13.0 - 17.0 g/dL   HCT 47.0 39.0 - 52.0 %   DG Chest Portable 1 View  Result Date: 08/22/2019 CLINICAL DATA:  Chest pain, shortness of breath, dizziness EXAM: PORTABLE CHEST 1 VIEW COMPARISON:  10/13/2018 FINDINGS: Stable right lower lobe/infrahilar scarring.  Left lung is essentially clear. No pleural effusion or pneumothorax. Heart is normal in size. Postsurgical changes related  to prior CABG. Coronary stent. Median sternotomy. IMPRESSION: No evidence of acute cardiopulmonary disease. Electronically Signed   By: Julian Hy M.D.   On: 08/22/2019 04:35    EKG EKG Interpretation  Date/Time:  Saturday August 22 2019 04:04:37 EST Ventricular Rate:  115 PR Interval:    QRS Duration: 140 QT Interval:  386 QTC Calculation: 534 R Axis:   -89 Text Interpretation: Atrial fibrillation Right bundle branch block Anterolateral infarct, old Confirmed by Randal Buba, Edith Lord ([id number]) on 08/22/2019 4:07:37 AM   Radiology DG Chest Portable 1 View  Result Date: 08/22/2019 CLINICAL DATA:  Chest pain, shortness of breath, dizziness EXAM: PORTABLE CHEST 1 VIEW COMPARISON:  10/13/2018 FINDINGS: Stable right lower lobe/infrahilar scarring. Left lung is essentially clear. No pleural effusion or pneumothorax. Heart is normal in size. Postsurgical changes related to prior CABG. Coronary stent. Median sternotomy. IMPRESSION: No evidence of acute cardiopulmonary disease. Electronically Signed   By: Julian Hy M.D.   On: 08/22/2019 04:35    Procedures Procedures (including critical care time)  Medications Ordered in ED Medications  diltiazem (CARDIZEM) 1 mg/mL load via infusion 10 mg (10 mg Intravenous Not Given 08/22/19 0451)    And  diltiazem (CARDIZEM) 125 mg in dextrose 5% 125 mL (1 mg/mL) infusion (5 mg/hr Intravenous New Bag/Given 08/22/19 0502)    ED Course  I have reviewed the triage vital signs and the nursing notes.  Pertinent labs & imaging results that were available during my care of the patient were reviewed by me and considered in my medical decision making (see chart for details).    MDM Reviewed: previous chart and nursing note Interpretation: x-ray (NACPD, normal potassium) Total time providing critical care: 75-105 minutes (diltiazem  drip). This excludes time spent performing separately reportable procedures and services. Consults: admitting MD      CHA2DS2-VASc Score = 6 The patient's score is based upon: CHF History: Yes HTN History: Yes Age : 63 + Diabetes History: Yes Stroke History: No Vascular Disease History: Yes Gender: Male      ASSESSMENT AND PLAN: Paroxysmal Atrial Fibrillation (ICD10:  I48.0) The patient's CHA2DS2-VASc score is 6, indicating a 9.7% annual risk of stroke.    Secondary Hypercoagulable State (ICD10:  D68.69) The patient is at significant risk for stroke/thromboembolism based upon his CHA2DS2-VASc Score of 6.  Continue Apixaban (Eliquis).    Signed,  Shaleah Nissley K Dayelin Balducci-Rasch, MD    08/22/2019 5:15 AM   Final Clinical Impression(s) / ED Diagnoses   Afib with RVR will, now controlled on diltiazem will admit to medicine.  If this were ACS with 3 days of symptoms troponins should be much higher,     Quintasha Gren, MD 08/22/19 [phone]

## 2019-08-22 NOTE — Plan of Care (Signed)
  Problem: Education: Goal: Knowledge of General Education information will improve Description Including pain rating scale, medication(s)/side effects and non-pharmacologic comfort measures Outcome: Progressing   Problem: Health Behavior/Discharge Planning: Goal: Ability to manage health-related needs will improve Outcome: Progressing   

## 2019-08-22 NOTE — Progress Notes (Signed)
Page to MD  3e04 Matthew [name] no CBG/SSI orders exist as mentioned in H&P

## 2019-08-22 NOTE — Progress Notes (Signed)
Patient denies complaints, nibbled at dinner and returned to resting.    Pt without current orders for CBGs and began to eat prior to evening/meal check could be done based on history.   Pt received long acting insulin late in the day/off schedule today in ED prior to coming to [facility].   Will obtain proper orders.

## 2019-08-22 NOTE — ED Notes (Signed)
Contacted pharmacy in reference to missing cardizem, per Jeneen Rinks, they are to tube it.

## 2019-08-23 DIAGNOSIS — I4891 Unspecified atrial fibrillation: Secondary | ICD-10-CM | POA: Diagnosis not present

## 2019-08-23 LAB — BASIC METABOLIC PANEL
Anion gap: 10 (ref 5–15)
BUN: 25 mg/dL — ABNORMAL HIGH (ref 8–23)
CO2: 24 mmol/L (ref 22–32)
Calcium: 9.2 mg/dL (ref 8.9–10.3)
Chloride: 99 mmol/L (ref 98–111)
Creatinine, Ser: 2.26 mg/dL — ABNORMAL HIGH (ref 0.61–1.24)
GFR calc Af Amer: 32 mL/min — ABNORMAL LOW (ref >60)
GFR calc non Af Amer: 27 mL/min — ABNORMAL LOW (ref >60)
Glucose, Bld: 134 mg/dL — ABNORMAL HIGH (ref 70–99)
Potassium: 4.3 mmol/L (ref 3.5–5.1)
Sodium: 133 mmol/L — ABNORMAL LOW (ref 135–145)

## 2019-08-23 LAB — GLUCOSE, CAPILLARY
Glucose-Capillary: 115 mg/dL — ABNORMAL HIGH (ref 70–99)
Glucose-Capillary: 102 mg/dL — ABNORMAL HIGH (ref 70–99)

## 2019-08-23 MED ORDER — MIDODRINE HCL 2.5 MG PO TABS: 2.5000 mg | ORAL_TABLET | Freq: Three times a day (TID) | ORAL | 0 refills | Status: DC | PRN

## 2019-08-23 MED ORDER — MIDODRINE HCL 5 MG PO TABS: 2.5000 mg | ORAL_TABLET | Freq: Three times a day (TID) | ORAL | Status: DC | PRN

## 2019-08-23 MED ORDER — SODIUM CHLORIDE 0.9 % IV BOLUS
250.0000 mL | Freq: Once | INTRAVENOUS | Status: AC
  Administered 2019-08-23: 250 mL via INTRAVENOUS

## 2019-08-23 MED ORDER — MIDODRINE HCL 5 MG PO TABS: 2.5000 mg | ORAL_TABLET | Freq: Three times a day (TID) | ORAL | Status: DC

## 2019-08-23 NOTE — Evaluation (Signed)
Occupational Therapy Evaluation Patient Details Name: Matthew [name] MRN: [id number] DOB: 1945/12/05 Today's Date: 08/23/2019    History of Present Illness 74 y.o. male with medical history significant of OSA on CPAP; RCC s/p nephrectomy; obesity (BMI 38); CAD s/p CABG; lung CA; afib on Eliquis; hypothyroidism; HTN; HLD; DM; and stage 3 CKD presenting with chest pain/dizziness.   Clinical Impression   This 74 y/o male presents with the above. PTA pt reports independence with mobility tasks, receives assist from spouse for bathing ADL. Pt currently with limitations due to orthostatic BP. Pt performing bed mobility and x2 sit<>stands at supervision level, deferred mobility beyond EOB as pt with drop in BP (per PT eval this AM, pt mobilizing well without AD or assist). Pt denies dizziness today but initiates returning to sitting after standing BP taken, (reports due to back pain). He currently requires minguard-minA for LB ADL. Suspect pt will progress well once BP improves. Educated in general safety during mobility/ADL tasks at home given recent dizziness with pt verbalizing understanding. Will continue to follow while acutely admitted to progress his overall safety and independence with ADL and mobility.  BP Sitting: 110/61, Initial stand: 74/48 - pt initiated return to sitting. BP with additional stand, taken after x1 min of static standing: 79/62 (pt initiates return to sitting).     Follow Up Recommendations  No OT follow up;Supervision/Assistance - 24 hour    Equipment Recommendations  Tub/shower seat           Precautions / Restrictions Precautions Precautions: Fall Precaution Comments: orthostatic Restrictions Weight Bearing Restrictions: No      Mobility Bed Mobility Overal bed mobility: Independent                Transfers Overall transfer level: Needs assistance Equipment used: None Transfers: Sit to/from Stand Sit to Stand: Supervision         General  transfer comment: for safety given recent dizziness     Balance                                           ADL either performed or assessed with clinical judgement   ADL Overall ADL's : Needs assistance/impaired Eating/Feeding: Independent;Sitting Eating/Feeding Details (indicate cue type and reason): pt eating lunch seated EOB end of session Grooming: Set up;Sitting   Upper Body Bathing: Set up;Supervision/ safety;Sitting   Lower Body Bathing: Supervison/ safety;Sit to/from stand   Upper Body Dressing : Set up;Supervision/safety;Sitting   Lower Body Dressing: Supervision/safety;Sit to/from stand               Functional mobility during ADLs: Supervision/safety(for sit<>stand) General ADL Comments: pt with limitations this session due to orthostatic BP, able to perform bed mobility and x2 sit<>stand at supervision level without AD - suspect will progress well once BP stabilizes, RN in to provide bolus/fluids during session                         Pertinent Vitals/Pain Pain Assessment: Faces Faces Pain Scale: Hurts even more Pain Location: low back Pain Descriptors / Indicators: Discomfort Pain Intervention(s): Limited activity within patient's tolerance;Monitored during session;Repositioned     Hand Dominance Right   Extremity/Trunk Assessment Upper Extremity Assessment Upper Extremity Assessment: Overall WFL for tasks assessed   Lower Extremity Assessment Lower Extremity Assessment: Defer to PT evaluation;Overall [facility] for tasks assessed  Cervical / Trunk Assessment Cervical / Trunk Assessment: Normal   Communication Communication Communication: No difficulties   Cognition Arousal/Alertness: Awake/alert Behavior During Therapy: WFL for tasks assessed/performed Overall Cognitive Status: Within Functional Limits for tasks assessed                                     General Comments       Exercises     Shoulder  Instructions      Home Living Family/patient expects to be discharged to:: Private residence Living Arrangements: Spouse/significant other;Children Available Help at Discharge: Family;Available 24 hours/day Type of Home: House Home Access: Stairs to enter [employer] of Steps: 1 Entrance Stairs-Rails: None Home Layout: Two level Alternate Level Stairs-Number of Steps: lift chair   Bathroom Shower/Tub: Teacher, early years/pre: Standard     Home Equipment: Cane - single point;Walker - 4 wheels          Prior Functioning/Environment Level of Independence: Needs assistance  Gait / Transfers Assistance Needed: independent with ambulation and mobility ADL's / Homemaking Assistance Needed: pt reports spouse assists with bathing, reports he is able to complete dressing ADL on his own, drives            OT Problem List: Decreased activity tolerance;Decreased knowledge of precautions;Obesity;Cardiopulmonary status limiting activity      OT Treatment/Interventions: Self-care/ADL training;Therapeutic exercise;Energy conservation;DME and/or AE instruction;Therapeutic activities;Patient/family education;Balance training    OT Goals(Current goals can be found in the care plan section) Acute Rehab OT Goals Patient Stated Goal: home when able OT Goal Formulation: With patient Time For Goal Achievement: 09/06/19 Potential to Achieve Goals: Good  OT Frequency: Min 2X/week   Barriers to D/C:            Co-evaluation              AM-PAC OT "6 Clicks" Daily Activity     Outcome Measure Help from another person eating meals?: None Help from another person taking care of personal grooming?: A Little Help from another person toileting, which includes using toliet, bedpan, or urinal?: A Little Help from another person bathing (including washing, rinsing, drying)?: A Little Help from another person to put on and taking off regular upper body clothing?:  None Help from another person to put on and taking off regular lower body clothing?: A Little 6 Click Score: 20   End of Session Equipment Utilized During Treatment: Gait belt Nurse Communication: Mobility status  Activity Tolerance: Patient tolerated treatment well;Other (comment)(limited due to BP) Patient left: with call bell/phone within reach;Other (comment)(seated EOB)  OT Visit Diagnosis: Dizziness and giddiness (R42);Other abnormalities of gait and mobility (R26.89)                Time: 2993-7169 OT Time Calculation (min): 22 min Charges:  OT General Charges $OT Visit: 1 Visit OT Evaluation $OT Eval Moderate Complexity: [place], OT Acute Rehabilitation Services Pager [phone] Office [phone]   Raymondo Band 08/23/2019, 1:44 PM

## 2019-08-23 NOTE — Evaluation (Signed)
Physical Therapy Evaluation Patient Details Name: Matthew [name] MRN: [id number] DOB: 1946/02/14 Today's Date: 08/23/2019   History of Present Illness  74 y.o. male with medical history significant of OSA on CPAP; RCC s/p nephrectomy; obesity (BMI 38); CAD s/p CABG; lung CA; afib on Eliquis; hypothyroidism; HTN; HLD; DM; and stage 3 CKD presenting with chest pain/dizziness.  Clinical Impression  Pt presents to PT at or near his baseline level of function, ambulating independently without device and performing all mobility required in the home setting without physical assistance. Pt does report one instance of dizziness when impulsively standing from edge of bed after quickly transitioning from supine to standing. Dizziness resolves with sitting and no further reports of symptoms despite orthostatic BP as documented below. PT provides education on strategies to mitigate orthostatic BP changes, pt acknowledges understanding and wants to D/C home. Pt has no further acute PT needs. Acute PT signing off.    Follow Up Recommendations No PT follow up    Equipment Recommendations  None recommended by PT    Recommendations for Other Services       Precautions / Restrictions Precautions Precautions: Fall Precaution Comments: orthostatic Restrictions Weight Bearing Restrictions: No      Mobility  Bed Mobility Overal bed mobility: Independent                Transfers Overall transfer level: Needs assistance Equipment used: None Transfers: Sit to/from Stand Sit to Stand: Supervision            Ambulation/Gait Ambulation/Gait assistance: Modified independent (Device/Increase time) Gait Distance (Feet): 80 Feet Assistive device: None Gait Pattern/deviations: Step-through pattern Gait velocity: functional Gait velocity interpretation: 1.31 - 2.62 ft/sec, indicative of limited community ambulator General Gait Details: slowed step through gait, widened BOS, pt declining  increased distances due to fear of inciting pain in both knees (chronic issue)  Stairs            Wheelchair Mobility    Modified Rankin (Stroke Patients Only)       Balance Overall balance assessment: No apparent balance deficits (not formally assessed)                                           Pertinent Vitals/Pain Pain Assessment: No/denies pain    Home Living Family/patient expects to be discharged to:: Private residence Living Arrangements: Spouse/significant other;Children Available Help at Discharge: Family;Available 24 hours/day Type of Home: House Home Access: Stairs to enter Entrance Stairs-Rails: None Entrance Stairs-Number of Steps: 1 Home Layout: Two level Home Equipment: Cane - single point;Walker - 4 wheels      Prior Function Level of Independence: Needs assistance   Gait / Transfers Assistance Needed: independent with ambulation and mobility  ADL's / Homemaking Assistance Needed: pt reports spouse assists with bathing        Hand Dominance   Dominant Hand: Right    Extremity/Trunk Assessment   Upper Extremity Assessment Upper Extremity Assessment: Overall WFL for tasks assessed    Lower Extremity Assessment Lower Extremity Assessment: Overall WFL for tasks assessed    Cervical / Trunk Assessment Cervical / Trunk Assessment: Normal  Communication   Communication: No difficulties  Cognition Arousal/Alertness: Awake/alert Behavior During Therapy: WFL for tasks assessed/performed Overall Cognitive Status: Within Functional Limits for tasks assessed  General Comments General comments (skin integrity, edema, etc.): Vitals: BP sitting 96/73, standing 70/56, after ambulating 93/60. Pt does not report dizziness during stand when BP was taken, but does report dizziness from prior impulsive stand. Pt also reports dizziness when urinating, having to strain to urinate. PT  provides education on taking time between changes in position, performing LE exercise prior to initiating standing, and to urinate while sitting to reduce falls risk    Exercises     Assessment/Plan    PT Assessment Patent does not need any further PT services  PT Problem List         PT Treatment Interventions      PT Goals (Current goals can be found in the Care Plan section)       Frequency     Barriers to discharge        Co-evaluation               AM-PAC PT "6 Clicks" Mobility  Outcome Measure Help needed turning from your back to your side while in a flat bed without using bedrails?: None Help needed moving from lying on your back to sitting on the side of a flat bed without using bedrails?: None Help needed moving to and from a bed to a chair (including a wheelchair)?: None Help needed standing up from a chair using your arms (e.g., wheelchair or bedside chair)?: None Help needed to walk in hospital room?: None Help needed climbing 3-5 steps with a railing? : A Little 6 Click Score: 23    End of Session   Activity Tolerance: Patient tolerated treatment well Patient left: in bed;with call bell/phone within reach Nurse Communication: Mobility status      Time: 4801-6553 PT Time Calculation (min) (ACUTE ONLY): 14 min   Charges:   PT Evaluation $PT Eval Low Complexity: [place], PT, DPT Acute Rehabilitation Pager: [phone]   Zenaida Niece 08/23/2019, 9:45 AM

## 2019-08-23 NOTE — Progress Notes (Signed)
Patient discharged: Home with family  Via: Wheelchair   Discharge paperwork given: to patient and family  Reviewed with teach back  IV and telemetry disconnected  Belongings given to patient  TED and abdominal binder given and pt educated how to use it  Patient stated he has 24 hrs supervision and all necessary equipment at home.

## 2019-08-23 NOTE — Discharge Instructions (Signed)
You have many chronic issues that needs to be managed closely and unfortunately they all affect each other. See attached information about orthostatic hypotension--- would start with lifestyle modifications (getting up slowly, TED hose, sleeping at an incline, abdominal binder) -I have prescribed midodrine but unfortunately one of the possible side effects is difficulty urinating so you will need to pay particular attention to this You also want to maximize control of your diabetes You will need close outpatient follow up with all of your specialists

## 2019-08-23 NOTE — Discharge Summary (Signed)
Physician Discharge Summary  Matthew [name] LPF:[id number] DOB: [birth year] DOA: 08/22/2019  PCP: Marda Stalker, PA-C  Admit date: 08/22/2019 Discharge date: 08/23/2019  Admitted From: Home Discharge disposition: Home   Recommendations for Outpatient Follow-Up:   1. Patient with orthostatic hypotension: Needs lifestyle changes including getting up slowly, TED hose/abdominal binder, sleeping with his head elevated among others.  Would be sure that TSH and cortisol has been drawn at next office visit 2. Needs close monitoring with PCP for his multiple medical issues 3. BMP 1 week   Discharge Diagnosis:   Principal Problem:   Atrial fibrillation with RVR (HCC) Active Problems:   Hypertension   Hypothyroidism   Mixed hyperlipidemia   CKD (chronic kidney disease), stage III   H/O: lung cancer   AAA (abdominal aortic aneurysm) (HCC)   Type 2 diabetes mellitus with vascular disease (HCC)   OSA (obstructive sleep apnea)    Discharge Condition: Improved.  Diet recommendation:   Carbohydrate-modified  Wound care: None.  Code status: Full.   History of Present Illness:   Matthew [name] is a 74 y.o. male with medical history significant of OSA on CPAP; RCC s/p nephrectomy; obesity (BMI 38); CAD s/p CABG; lung CA; afib on Eliquis; hypothyroidism; HTN; HLD; DM; and stage 3 CKD presenting with chest pain/dizziness.  He reports that "I didn't feel right and my heart was racing fast."  He checked it and it read 45 but he knew that wasn't right. Mild heaviness that improved with NTG.  He feels ok now, but has not stood up.  He is currently in sinus on the monitor.  He prefers to go home, if possible.  "I feel alright now."    His wife reports that he is service connected for [place], also has fatty liver disease/cirrhosis.  He started having abdominal discomfort about a week ago.  His wife reports that he will try to talk his way out of the hospital now that he is feeling  better but she is concerned about his dizziness and risk of falls.   Hospital Course by Problem:    Orthostatic hypotension -Patient states he has had issues with hypotension for a while now -Will start TED hose and abdominal binder as well as other lifestyle modifications to help: Including encouraging patient to sleep with his head elevated and to change positions slowly -Patient has multiple medical issues and needs close management with his specialist outpatient at the [place] -TSH if not recently checked at the [place] along with a cortisol -Given IV fluids with minimal improvement of blood pressure but patient denies any symptoms and has worked with both PT and OT without further issues when he change positions slowly but unfortunately patient tends to be fairly impulsive.  Encourage patient to follow the recommendations of therapy for continued exercise as well as slow change in position -Can do trial of midodrine but patient does have urinary issues so this could exacerbate those issues -Close follow-up outpatient with urology  Atrial Fibrillation with h/o CAD -Patient presenting with recurrent afib, with RVRd  -HS troponin negative on repeat; initial mild elevation was likely associated with demand ischemia. -Continue Plavix, Imdur -Heart rate is well controlled on Multaq, will continue. -CHA2DS2-VASc Score is >2; continue Eliquis.  HTN -Continue Toprol XL-May need to set holding parameters  HLD -Continue Zetia, fenofibrate, Crestor  DM -Close outpatient follow-up with PCP  Stage 3b CKD -Appears to be at or near baseline  Hypothyroidism -Continue Synthroid at  current dose for now -TSH check as an outpatient with PCP  OSA -Continue CPAP  Depression/anxiety -Continue Xanax, Wellbutrin, hydroxyzine, nortriptyline -If able would encourage patient to be weaned off some of these medications that can increase his risk of falls  Spoke with wife about patient's multiple  medical issues and need for close outpatient follow-up .  Unfortunately most of his issues are chronic and will require titrations of medications and lifestyle changes.   Medical Consultants:      Discharge Exam:   Vitals:   08/23/19 0458 08/23/19 1431  BP: (!) 90/53 101/63  Pulse: 73 63  Resp: 18   Temp: 97.6 F (36.4 C)   SpO2: 98%    Vitals:   08/23/19 0016 08/23/19 0458 08/23/19 0527 08/23/19 1431  BP: (!) 98/54 (!) 90/53  101/63  Pulse: 68 73  63  Resp: 18 18    Temp: 97.9 F (36.6 C) 97.6 F (36.4 C)    TempSrc: Oral Oral    SpO2: 100% 98%    Weight:   121.7 kg   Height:        General exam: Appears calm and comfortable.  Chronically ill-appearing  The results of significant diagnostics from this hospitalization (including imaging, microbiology, ancillary and laboratory) are listed below for reference.     Procedures and Diagnostic Studies:   DG Chest Portable 1 View  Result Date: 08/22/2019 CLINICAL DATA:  Chest pain, shortness of breath, dizziness EXAM: PORTABLE CHEST 1 VIEW COMPARISON:  10/13/2018 FINDINGS: Stable right lower lobe/infrahilar scarring. Left lung is essentially clear. No pleural effusion or pneumothorax. Heart is normal in size. Postsurgical changes related to prior CABG. Coronary stent. Median sternotomy. IMPRESSION: No evidence of acute cardiopulmonary disease. Electronically Signed   By: Julian Hy M.D.   On: 08/22/2019 04:35     Labs:   Basic Metabolic Panel: Recent Labs  Lab 08/22/19 0423 08/23/19 0900  NA 137 133*  K 4.0 4.3  CL 105 99  CO2  --  24  GLUCOSE 140* 134*  BUN 23 25*  CREATININE 2.10* 2.26*  CALCIUM  --  9.2   GFR Estimated Creatinine Clearance: 38 mL/min (A) (by C-G formula based on SCr of 2.26 mg/dL (H)). Liver Function Tests: No results for input(s): AST, ALT, ALKPHOS, BILITOT, PROT, ALBUMIN in the last 168 hours. No results for input(s): LIPASE, AMYLASE in the last 168 hours. No results for  input(s): AMMONIA in the last 168 hours. Coagulation profile No results for input(s): INR, PROTIME in the last 168 hours.  CBC: Recent Labs  Lab 08/22/19 0423 08/22/19 0430  WBC  --  10.8*  NEUTROABS  --  7.1  HGB 16.0 15.1  HCT 47.0 47.4  MCV  --  95.0  PLT  --  304   Cardiac Enzymes: No results for input(s): CKTOTAL, CKMB, CKMBINDEX, TROPONINI in the last 168 hours. BNP: Invalid input(s): POCBNP CBG: Recent Labs  Lab 08/22/19 1306 08/22/19 2116 08/23/19 0613 08/23/19 1158  GLUCAP 200* 238* 115* 102*   D-Dimer No results for input(s): DDIMER in the last 72 hours. Hgb A1c Recent Labs    08/22/19 0430  HGBA1C 7.6*   Lipid Profile No results for input(s): CHOL, HDL, LDLCALC, TRIG, CHOLHDL, LDLDIRECT in the last 72 hours. Thyroid function studies No results for input(s): TSH, T4TOTAL, T3FREE, THYROIDAB in the last 72 hours.  Invalid input(s): FREET3 Anemia work up No results for input(s): VITAMINB12, FOLATE, FERRITIN, TIBC, IRON, RETICCTPCT in the last 72 hours.  Microbiology Recent Results (from the past 240 hour(s))  SARS CORONAVIRUS 2 (TAT 6-24 HRS) Nasopharyngeal Nasopharyngeal Swab     Status: None   Collection Time: 08/22/19  6:08 AM   Specimen: Nasopharyngeal Swab  Result Value Ref Range Status   SARS Coronavirus 2 NEGATIVE NEGATIVE Final    Comment: (NOTE) SARS-CoV-2 target nucleic acids are NOT DETECTED. The SARS-CoV-2 RNA is generally detectable in upper and lower respiratory specimens during the acute phase of infection. Negative results do not preclude SARS-CoV-2 infection, do not rule out co-infections with other pathogens, and should not be used as the sole basis for treatment or other patient management decisions. Negative results must be combined with clinical observations, patient history, and epidemiological information. The expected result is Negative. Fact Sheet for Patients: SugarRoll.be Fact Sheet for  Healthcare Providers: https://www.woods-mathews.com/ This test is not yet approved or cleared by the [place] FDA and  has been authorized for detection and/or diagnosis of SARS-CoV-2 by FDA under an Emergency Use Authorization (EUA). This EUA will remain  in effect (meaning this test can be used) for the duration of the COVID-19 declaration under Section 56 4(b)(1) of the Act, 21 U.S.C. section 360bbb-3(b)(1), unless the authorization is terminated or revoked sooner. Performed at [facility], [place] [address] [place], [place] [address]      Discharge Instructions:   Discharge Instructions    Amb referral to AFIB Clinic   Complete by: As directed    Diet Carb Modified   Complete by: As directed    Discharge instructions   Complete by: As directed    See attached information about orthostatic hypotension and lifestyle modifications   Increase activity slowly   Complete by: As directed      Allergies as of 08/23/2019      Reactions   Amiodarone Other (See Comments)   Caused lung and liver toxicity   Tetanus Toxoids Hives      Medication List    STOP taking these medications   isosorbide mononitrate 30 MG 24 hr tablet Commonly known as: IMDUR   sucralfate 1 GM/10ML suspension Commonly known as: CARAFATE     TAKE these medications   Antacid Extra Strength 160-105 MG Chew Generic drug: Alum Hydroxide-Mag Carbonate Chew 2 tablets by mouth 4 (four) times daily - after meals and at bedtime. "ALOH"   buPROPion 150 MG 24 hr tablet Commonly known as: WELLBUTRIN XL Take 150 mg by mouth daily.   capsaicin 0.025 % cream Commonly known as: ZOSTRIX Apply 1 application topically 2 (two) times daily as needed (for pain).   clopidogrel 75 MG tablet Commonly known as: PLAVIX Take 1 tablet (75 mg total) by mouth daily with breakfast.   clotrimazole 1 % cream Commonly known as: LOTRIMIN Apply 1 application topically 2 (two) times daily as needed (for  rash).   D3-1000 25 MCG (1000 UT) tablet Generic drug: Cholecalciferol Take 1,000 Units by mouth at bedtime.   dronedarone 400 MG tablet Commonly known as: MULTAQ Take 400 mg by mouth 2 (two) times daily with a meal.   Eliquis 5 MG Tabs tablet Generic drug: apixaban Take 5 mg by mouth 2 (two) times daily.   esomeprazole 40 MG capsule Commonly known as: NEXIUM Take 40 mg by mouth daily at 12 noon.   ezetimibe 10 MG tablet Commonly known as: ZETIA Take 5 mg by mouth at bedtime.   ferrous sulfate 325 (65 FE) MG tablet Take 325 mg by mouth every Monday, Wednesday,  and Friday.   finasteride 5 MG tablet Commonly known as: PROSCAR Take 5 mg by mouth at bedtime.   Fish Oil 1000 MG Caps Take 2,000 mg by mouth 2 (two) times daily.   gabapentin 300 MG capsule Commonly known as: NEURONTIN Take 300 mg by mouth 2 (two) times daily.   glucose 4 GM chewable tablet Chew 1 tablet by mouth as needed for low blood sugar.   Hydrocortisone Ace-Pramoxine 2.5-1 % Crea Place 1 application rectally as needed.   hydrOXYzine 10 MG tablet Commonly known as: ATARAX/VISTARIL Take 10 mg by mouth daily as needed for itching.   ketoconazole 2 % cream Commonly known as: NIZORAL Apply 1 application topically daily as needed for irritation (or rashes).   ketotifen 0.025 % ophthalmic solution Commonly known as: ZADITOR Place 1 drop into both eyes daily as needed (allergies).   Levemir FlexPen 100 UNIT/ML FlexPen Generic drug: insulin detemir Inject 50 Units into the skin 2 (two) times daily.   levothyroxine 150 MCG tablet Commonly known as: SYNTHROID Take 150 mcg by mouth daily before breakfast.   LIDOCAINE EX Apply 1 application topically 2 (two) times daily as needed (for pain).   Linzess 290 MCG Caps capsule Generic drug: linaclotide Take 290 mcg by mouth daily as needed (for constipation).   magnesium oxide 400 MG tablet Commonly known as: MAG-OX Take 400 mg by mouth 2 (two)  times a day.   mesalamine 1000 MG suppository Commonly known as: CANASA Place 1,000 mg rectally at bedtime as needed (ulcerative colitis flares).   mesalamine 1.2 g EC tablet Commonly known as: LIALDA Take 1.2 g by mouth daily as needed (ulcerative colitis flares).   metoprolol succinate 25 MG 24 hr tablet Commonly known as: TOPROL-XL Take 12.5 mg by mouth daily.   midodrine 2.5 MG tablet Commonly known as: PROAMATINE Take 1 tablet (2.5 mg total) by mouth 3 (three) times daily as needed (dizziness).   neomycin-bacitracin-polymyxin ointment Commonly known as: NEOSPORIN Apply 1 application topically as needed for wound care.   nitroGLYCERIN 0.4 MG SL tablet Commonly known as: NITROSTAT Place 0.4 mg under the tongue every 5 (five) minutes x 3 doses as needed for chest pain.   nortriptyline 25 MG capsule Commonly known as: PAMELOR Take 50 mg by mouth 2 (two) times daily as needed (headaches).   NovoLOG FlexPen 100 UNIT/ML FlexPen Generic drug: insulin aspart Inject 10-40 Units into the skin 2 (two) times daily after a meal. Sliding scale   nystatin cream Commonly known as: MYCOSTATIN Apply 1 application topically daily as needed for dry skin (or rash).   oxyCODONE-acetaminophen 10-325 MG tablet Commonly known as: PERCOCET Take 1 tablet by mouth every 6 (six) hours as needed for pain.   polyethylene glycol powder 17 GM/SCOOP powder Commonly known as: GLYCOLAX/MIRALAX Take 17 g by mouth daily as needed for constipation.   rosuvastatin 40 MG tablet Commonly known as: CRESTOR Take 40 mg by mouth at bedtime.   senna 8.6 MG Tabs tablet Commonly known as: SENOKOT Take 1-2 tablets by mouth See admin instructions. Take 2 tablets by mouth in the morning and 1 tablet at bedtime   sertraline 25 MG tablet Commonly known as: ZOLOFT Take 25 mg by mouth every morning.   silver sulfADIAZINE 1 % cream Commonly known as: SILVADENE Apply 1 application topically daily as needed  (skin care).   tamsulosin 0.4 MG Caps capsule Commonly known as: FLOMAX Take 0.4 mg by mouth daily.   terbinafine 1 % cream  Commonly known as: LAMISIL Apply 1 application topically daily as needed (skin care).   traZODone 100 MG tablet Commonly known as: DESYREL Take 100 mg by mouth at bedtime.   triamcinolone cream 0.1 % Commonly known as: KENALOG Apply 1 application topically daily as needed (rash).   urea 10 % lotion Apply 1 application topically as needed for dry skin.      Follow-up Information    Marda Stalker, PA-C Follow up in 1 week(s).   Specialty: Family Medicine Contact information: [place] [address] [phone]        Jettie Booze, MD .   Specialties: Cardiology, Radiology, Interventional Cardiology Contact information: [address] [place] [address] [phone]            Time coordinating discharge: 25 min  Signed:  [facility] 08/23/2019, 3:01 PM

## 2019-08-28 DIAGNOSIS — I482 Chronic atrial fibrillation, unspecified: Secondary | ICD-10-CM | POA: Diagnosis not present

## 2019-08-28 DIAGNOSIS — I2510 Atherosclerotic heart disease of native coronary artery without angina pectoris: Secondary | ICD-10-CM | POA: Diagnosis not present

## 2019-08-28 DIAGNOSIS — E119 Type 2 diabetes mellitus without complications: Secondary | ICD-10-CM | POA: Diagnosis not present

## 2019-08-28 DIAGNOSIS — Z9989 Dependence on other enabling machines and devices: Secondary | ICD-10-CM | POA: Diagnosis not present

## 2019-08-28 DIAGNOSIS — G4733 Obstructive sleep apnea (adult) (pediatric): Secondary | ICD-10-CM | POA: Diagnosis not present

## 2019-08-28 DIAGNOSIS — E039 Hypothyroidism, unspecified: Secondary | ICD-10-CM | POA: Diagnosis not present

## 2019-08-28 DIAGNOSIS — I951 Orthostatic hypotension: Secondary | ICD-10-CM | POA: Diagnosis not present

## 2019-08-28 DIAGNOSIS — I714 Abdominal aortic aneurysm, without rupture: Secondary | ICD-10-CM | POA: Diagnosis not present

## 2019-09-29 ENCOUNTER — Other Ambulatory Visit: Payer: Self-pay

## 2019-09-29 ENCOUNTER — Ambulatory Visit (INDEPENDENT_AMBULATORY_CARE_PROVIDER_SITE_OTHER): Payer: 59 | Admitting: Neurology

## 2019-09-29 ENCOUNTER — Encounter: Payer: Self-pay | Admitting: Neurology

## 2019-09-29 VITALS — BP 105/64 | HR 58 | Temp 97.1°F | Ht 72.0 in | Wt 274.5 lb

## 2019-09-29 DIAGNOSIS — I4891 Unspecified atrial fibrillation: Secondary | ICD-10-CM | POA: Diagnosis not present

## 2019-09-29 DIAGNOSIS — G4489 Other headache syndrome: Secondary | ICD-10-CM

## 2019-09-29 DIAGNOSIS — E1142 Type 2 diabetes mellitus with diabetic polyneuropathy: Secondary | ICD-10-CM | POA: Diagnosis not present

## 2019-09-29 DIAGNOSIS — R42 Dizziness and giddiness: Secondary | ICD-10-CM | POA: Insufficient documentation

## 2019-09-29 NOTE — Progress Notes (Signed)
[facility]  PATIENT: Matthew [name] DOB: [birth year]  REFERRING DOCTOR OR PCP: Marda Stalker, PA-C SOURCE: Note from primary care, previous neurology note, imaging reports and images  _________________________________   HISTORICAL  CHIEF COMPLAINT:  Chief Complaint  Patient presents with  . New Patient (Initial Visit)    RM 13 with Valerie (temp: 97.2). Paper referral from Marda Stalker, PA (PCP) for daily headaches for 3 weeks. States headaches resolved. He gets dizzy if he gets up too fast. Sees cardiologist. Feels his balance is off, started about a year ago. He has had about 3-4 falls in the last year. In [facility] today. He has cane/walker but does not use them. Per [name], he only walks short distances, otherwise he is laying down.    HISTORY OF PRESENT ILLNESS:  I had the pleasure of seeing Matthew [name] at [facility] for neurologic consultation regarding his headaches and lightheadedness.   He is a 74 year old man who I have seen several times over the last few years, initially for postconcussive syndrome and then for diabetic polyneuropathy.  Couple months ago, he began to have holocephalic headaches that were occurring daily.  Pain would fluctuate some as the day went on, often increasing.  He did not have nausea, vomiting or photophobia.  Moving the head worsen the pain.  However, over the last couple weeks, headaches have been better and are now milder and intermittent.  For least the last 6 months, he feels light-headed many times when he stands up.  Some days this does not happen much and others it will happen almost every time he stands up.  When the dizziness occurs, he notes the lightheadedness about 15 seconds after standing and it does not always improve if he stays standing.   If he has severe lightheadedness, he sits backup and may be better when he retries again a few minutes later.   He has frequent falls, about once  a month when he stands and he notes his legs feeling weak.  He also notes bilateral knee pain, better since knee injections.     Walking has progressively worsened.   He states he walks without holding on but his assistant notes he holds on to furniture.    I first saw him 03/13/2017 for post-concussive headaches and again in 2020 for diabetic polyneuropathy.   Headaches improved with nortriptyline.   Later, the nortriptyline was discontinued when he went on trazodone for insomnia.  He is on oxycodone for chronic lower back pain.    He has paroxysmal atrial fibrillation and reports that the rate is fast when he flips into atrial fibrillation and that he feels very light-headed. He is on Eliquis.  He also has diabetic polyneuropathy.      He reports 2 x nocturia.   He was on trazodone for insomnia but stopped as he had a hangover the next day.  MRI of the brain from 2018 showed mild generalized cortical atrophy and chronic microvascular ischemic change.  There were postoperative sinus changes and sinusitis.  REVIEW OF SYSTEMS: Constitutional: No fevers, chills, sweats, or change in appetite Eyes: No visual changes, double vision, eye pain Ear, nose and throat: No hearing loss, ear pain, nasal congestion, sore throat Cardiovascular: No chest pain, palpitations Respiratory: No shortness of breath at rest or with exertion.   No wheezes GastrointestinaI: No nausea, vomiting, diarrhea, abdominal pain, fecal incontinence Genitourinary: No dysuria, urinary retention or frequency.  No nocturia. Musculoskeletal: No neck pain, back  pain Integumentary: No rash, pruritus, skin lesions Neurological: as above Psychiatric: No depression at this time.  No anxiety Endocrine: No palpitations, diaphoresis, change in appetite, change in weigh or increased thirst Hematologic/Lymphatic: No anemia, purpura, petechiae. Allergic/Immunologic: No itchy/runny eyes, nasal congestion, recent allergic reactions,  rashes  ALLERGIES: Allergies  Allergen Reactions  . Amiodarone Other (See Comments)    Caused lung and liver toxicity  . Tetanus Toxoids Hives    HOME MEDICATIONS:  Current Outpatient Medications:  .  apixaban (ELIQUIS) 5 MG TABS tablet, Take 5 mg by mouth 2 (two) times daily., Disp: , Rfl:  .  buPROPion (WELLBUTRIN XL) 300 MG 24 hr tablet, Take 300 mg by mouth daily., Disp: , Rfl:  .  clopidogrel (PLAVIX) 75 MG tablet, Take 1 tablet (75 mg total) by mouth daily with breakfast., Disp: 30 tablet, Rfl: 11 .  dronedarone (MULTAQ) 400 MG tablet, Take 400 mg by mouth 2 (two) times daily with a meal., Disp: , Rfl:  .  ezetimibe (ZETIA) 10 MG tablet, Take 5 mg by mouth at bedtime., Disp: , Rfl:  .  ferrous sulfate 325 (65 FE) MG tablet, Take 325 mg by mouth every Monday, Wednesday, and Friday. , Disp: , Rfl:  .  finasteride (PROSCAR) 5 MG tablet, Take 5 mg by mouth at bedtime. , Disp: , Rfl:  .  gabapentin (NEURONTIN) 300 MG capsule, Take 300 mg by mouth 2 (two) times daily. , Disp: , Rfl:  .  Insulin Detemir (LEVEMIR FLEXPEN) 100 UNIT/ML Pen, Inject 50 Units into the skin 2 (two) times daily. , Disp: , Rfl:  .  levothyroxine (SYNTHROID) 150 MCG tablet, Take 150 mcg by mouth daily before breakfast. , Disp: , Rfl:  .  magnesium oxide (MAG-OX) 400 MG tablet, Take 400 mg by mouth in the morning, at noon, and at bedtime. , Disp: , Rfl:  .  Melatonin 3 MG CAPS, Take 1 capsule by mouth at bedtime., Disp: , Rfl:  .  metoprolol succinate (TOPROL-XL) 25 MG 24 hr tablet, Take 12.5 mg by mouth daily., Disp: , Rfl:  .  nortriptyline (PAMELOR) 25 MG capsule, Take 50 mg by mouth at bedtime. , Disp: , Rfl:  .  Omega-3 Fatty Acids (FISH OIL) 1000 MG CAPS, Take 2,000 mg by mouth 2 (two) times daily. , Disp: , Rfl:  .  oxyCODONE-acetaminophen (PERCOCET) 10-325 MG per tablet, Take 1 tablet by mouth every 6 (six) hours as needed for pain. , Disp: , Rfl:  .  RABEprazole (ACIPHEX) 20 MG tablet, Take 20 mg by mouth  in the morning and at bedtime., Disp: , Rfl:  .  rosuvastatin (CRESTOR) 40 MG tablet, Take 40 mg by mouth at bedtime. , Disp: , Rfl:  .  senna (SENOKOT) 8.6 MG TABS tablet, Take 1-2 tablets by mouth See admin instructions. Take 2 tablets by mouth in the morning and 1 tablet at bedtime, Disp: , Rfl:  .  sertraline (ZOLOFT) 25 MG tablet, Take 25 mg by mouth every morning., Disp: , Rfl:  .  tamsulosin (FLOMAX) 0.4 MG CAPS capsule, Take 0.4 mg by mouth daily., Disp: , Rfl:  .  traZODone (DESYREL) 100 MG tablet, Take 100 mg by mouth at bedtime., Disp: , Rfl:  .  triamcinolone cream (KENALOG) 0.1 %, Apply 1 application topically daily as needed (rash)., Disp: , Rfl:  .  VITAMIN D PO, Take 1,000 Units by mouth daily., Disp: , Rfl:   PAST MEDICAL HISTORY: Past Medical History:  Diagnosis  Date  . AAA (abdominal aortic aneurysm) ([place])    a. Aortic US 05/2012: 3.9 cm fusiform abdominal aortic aneurysm. F/u recommended 05/2014.  Marland Kitchen Anemia   . Arthritis    DDD  . Atrial fibrillation ([place])    a. Post-op CABG 05/2009. b. Re-identified 12/2013.   Marland Kitchen CAD (coronary artery disease)    a. s/p CABGx4 05/2009. 10/10/16 PCI with DES x1 SVG-->OM2/OM3  . Chronic constipation   . CKD (chronic kidney disease), stage III   . Cocaine abuse in remission ([place])   . Depression   . Diabetes mellitus   . Empyema lung ([place])    a. PNA 02/2011 c/b empyema requiring chest tube, drainage of pleural effusion and decortication.  . Frequent falls   . GERD (gastroesophageal reflux disease)   . Hepatic steatosis   . Hyperlipidemia   . Hypertension   . Hypothyroidism   . Iron deficiency anemia   . LAFB (left anterior fascicular block)   . Lung cancer ([place])    a. Tx with interleukin in 2002 per patient.  . Nodule of left lung    a. Cavitary nodules 02/2011, f/u recommended.  . Obesity   . Post concussion syndrome   . RBBB   . Renal cell carcinoma    a. s/p R nephrectomy 2002. Reported pulmonary mets per note at that time.  .  Scrotal abscess    a. 2012 2/2 Streptococcus group C, status post incision and drainage.  . Sleep apnea    wears CPAP  . 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  . Vitamin D deficiency   . Wears dentures     PAST SURGICAL HISTORY: Past Surgical History:  Procedure Laterality Date  . Bronchoscopy, right video-assisted thoracoscopy, minithoracotomy, drainage of pleural effusion, and decortication.  02/26/2011   [facility]  . CABG x 4  05/13/2009   [facility]  . CARDIAC SURGERY    . COLONOSCOPY W/ BIOPSIES AND POLYPECTOMY    . CORONARY STENT INTERVENTION N/A 10/10/2016   Procedure: Coronary Stent Intervention;  Surgeon: Troy Sine, MD;  Location: [place] [facility];  Service: Cardiovascular;  Laterality: N/A;  . CORONARY STENT INTERVENTION N/A 10/15/2018   Procedure: CORONARY STENT INTERVENTION;  Surgeon: Burnell Blanks, MD;  Location: [place] [facility];  Service: Cardiovascular;  Laterality: N/A;  . HEMORRHOID SURGERY    . HERNIA REPAIR    . Incision, drainage and debridement of perineum and  scrotum    . Irrigation and debridement of scrotal wound and closure of scrotal wound.    Marland Kitchen LEFT HEART CATH AND CORS/GRAFTS ANGIOGRAPHY N/A 10/10/2016   Procedure: Left Heart Cath and Cors/Grafts Angiography;  Surgeon: Troy Sine, MD;  Location: [place] [facility];  Service: Cardiovascular;  Laterality: N/A;  . LEFT HEART CATH AND CORS/GRAFTS ANGIOGRAPHY N/A 10/15/2018   Procedure: LEFT HEART CATH AND CORS/GRAFTS ANGIOGRAPHY;  Surgeon: Burnell Blanks, MD;  Location: [place] [facility];  Service: Cardiovascular;  Laterality: N/A;  . MULTIPLE TOOTH EXTRACTIONS    . RADIOLOGY WITH ANESTHESIA N/A 05/09/2017   Procedure: MRI WITH ANESTHESIA   BRAIN WITHOUT CONTRAST;  Surgeon: Radiologist, Medication, MD;  Location: [place];  Service: Radiology;  Laterality: N/A;  . Right radical nephrectomy.    . TOOTH EXTRACTION      FAMILY HISTORY: Family History  Problem  Relation Age of Onset  . Heart disease Father   . Alzheimer's disease Mother   . Pneumonia  Other   . Colon cancer Neg Hx   . Esophageal cancer Neg Hx   . Stomach cancer Neg Hx   . Rectal cancer Neg Hx     SOCIAL HISTORY:  Social History   Socioeconomic History  . Marital status: Married    Spouse name: Not on file  . Number of children: 4  . Years of education: Not on file  . Highest education level: Not on file  Occupational History  . Occupation: CAR SERVICE    Employer: SELF EMPLOYED  Tobacco Use  . Smoking status: Never Smoker  . Smokeless tobacco: Never Used  Substance and Sexual Activity  . Alcohol use: No  . Drug use: Not Currently    Types: Cocaine    Comment: stopped cocaine maybe 10 years ago  . Sexual activity: Not on file  Other Topics Concern  . Not on file  Social History Narrative   Lives with wife and son.   Caffeine VWU:JWJX daily   Right handed    Social Determinants of Health   Financial Resource Strain:   . Difficulty of Paying Living Expenses:   Food Insecurity:   . Worried About Charity fundraiser in the Last Year:   . Arboriculturist in the Last Year:   Transportation Needs:   . Film/video editor (Medical):   Marland Kitchen Lack of Transportation (Non-Medical):   Physical Activity:   . Days of Exercise per Week:   . Minutes of Exercise per Session:   Stress:   . Feeling of Stress :   Social Connections:   . Frequency of Communication with Friends and Family:   . Frequency of Social Gatherings with Friends and Family:   . Attends Religious Services:   . Active Member of Clubs or Organizations:   . Attends Archivist Meetings:   Marland Kitchen Marital Status:   Intimate Partner Violence:   . Fear of Current or Ex-Partner:   . Emotionally Abused:   Marland Kitchen Physically Abused:   . Sexually Abused:      PHYSICAL EXAM  Vitals:   09/29/19 1104  BP: 105/64  Pulse: (!) 58  Temp: (!) 97.1 F (36.2 C)  Weight: 274 lb 8 oz (124.5 kg)  Height: 6'  (1.829 m)    Body mass index is 37.23 kg/m.   General: The patient is well-developed and well-nourished and in no acute distress  HEENT:  Head is [place]/AT.  Sclera are anicteric.    Neck: No carotid bruits are noted.  The neck is mildly tender over the occiput with a mildly reduced range of motion..  Cardiovascular: The heart has a regular rate and rhythm with a normal S1 and S2. There were no murmurs, gallops or rubs.    Skin: Extremities are without rash or  edema.  Musculoskeletal:  Back is mildly tender.  He has some tenderness at the knees.  Crepitance in the knees.  Neurologic Exam  Mental status: The patient is alert and oriented x 3 at the time of the examination. The patient has apparent normal recent and remote memory, with an apparently normal attention span and concentration ability.   Speech is normal.  Cranial nerves: Extraocular movements are full.   There is good facial sensation to soft touch bilaterally.Facial strength is normal.  Trapezius and sternocleidomastoid strength is normal. No dysarthria is noted.    No obvious hearing deficits are noted.  Motor:  Muscle bulk is normal.   Tone is normal. Strength  is  5 / 5 in the arms.  He has mildly 4+ ankle extension and 4 toe extension.  Sensory: Sensation was intact in the hands.  He had reduced sensation to vibration at the ankles further reduced at the toes.  He had reduced touch sensation from just above the ankle to the feet.  Coordination: Cerebellar testing reveals good finger-nose-finger and ok heel-to-shin bilaterally (reduce likely due to arthritis).  Gait and station: Station is normal.   The gait is arthritic and mildly wide with a mildly reduced stride.  He can turn in 3 steps.  Cannot do tandem gait.  Reflexes: Deep tendon reflexes are symmetric and normal in the arms, trace at the knees and absent at the ankles..   Plantar responses are flexor.    DIAGNOSTIC DATA (LABS, IMAGING, TESTING) - I reviewed  patient records, labs, notes, testing and imaging myself where available.  Lab Results  Component Value Date   WBC 10.8 (H) 08/22/2019   HGB 15.1 08/22/2019   HCT 47.4 08/22/2019   MCV 95.0 08/22/2019   PLT 304 08/22/2019      Component Value Date/Time   NA 133 (L) 08/23/2019 0900   K 4.3 08/23/2019 0900   CL 99 08/23/2019 0900   CO2 24 08/23/2019 0900   GLUCOSE 134 (H) 08/23/2019 0900   BUN 25 (H) 08/23/2019 0900   CREATININE 2.26 (H) 08/23/2019 0900   CALCIUM 9.2 08/23/2019 0900   PROT 6.2 (L) 10/13/2018 1343   ALBUMIN 3.1 (L) 10/13/2018 1343   AST 26 10/13/2018 1343   ALT 19 10/13/2018 1343   ALT 79 (H) 10/20/2014 1225   ALKPHOS 29 (L) 10/13/2018 1343   BILITOT 1.1 10/13/2018 1343   GFRNONAA 27 (L) 08/23/2019 0900   GFRAA 32 (L) 08/23/2019 0900   Lab Results  Component Value Date   CHOL 194 10/09/2016   HDL 28 (L) 10/09/2016   LDLCALC 113 (H) 10/09/2016   TRIG 265 (H) 10/09/2016   CHOLHDL 6.9 10/09/2016   Lab Results  Component Value Date   HGBA1C 7.6 (H) 08/22/2019   No results found for: VITAMINB12 Lab Results  Component Value Date   TSH 2.090 10/08/2016       ASSESSMENT AND PLAN  Diabetic polyneuropathy associated with type 2 diabetes mellitus (HCC)  Other headache syndrome  Episodic lightheadedness  Atrial fibrillation with rapid ventricular response ([place])   1.   His headaches are intermittent and mild now.  If they become daily again, consider adding nortriptyline 25 mg nightly.  If headaches become much worse and he is tender over the occiput, we could consider doing trigger point injections to help. 2.   The lightheadedness is likely multifactorial related to medications and possibly an autonomic component of the diabetic neuropathy.  He is advised to stand for a few seconds before he begins to walk and to sit back down if he is lightheaded.   3.    He will return to see me as needed if he has new or worsening neurologic symptoms.  40-minute  office visit with the majority of the time spent face-to-face for history and physical, discussion/counseling and decision-making.  Additional time with record review and documentation.    Bryor Rami A. Felecia Shelling, MD, [facility] 01/27/2992, 71:69 AM Certified in Neurology, Clinical Neurophysiology, Sleep Medicine and Neuroimaging  [facility] [address], [place], [place] [address] [phone]

## 2019-10-29 ENCOUNTER — Encounter (HOSPITAL_COMMUNITY): Payer: Self-pay

## 2019-10-29 ENCOUNTER — Inpatient Hospital Stay (HOSPITAL_COMMUNITY): Payer: 59

## 2019-10-29 ENCOUNTER — Other Ambulatory Visit: Payer: Self-pay

## 2019-10-29 ENCOUNTER — Emergency Department (HOSPITAL_COMMUNITY): Payer: 59

## 2019-10-29 DIAGNOSIS — N179 Acute kidney failure, unspecified: Secondary | ICD-10-CM

## 2019-10-29 DIAGNOSIS — Z20822 Contact with and (suspected) exposure to covid-19: Secondary | ICD-10-CM | POA: Diagnosis present

## 2019-10-29 DIAGNOSIS — R092 Respiratory arrest: Secondary | ICD-10-CM | POA: Diagnosis not present

## 2019-10-29 DIAGNOSIS — F329 Major depressive disorder, single episode, unspecified: Secondary | ICD-10-CM

## 2019-10-29 DIAGNOSIS — Z820 Family history of epilepsy and other diseases of the nervous system: Secondary | ICD-10-CM

## 2019-10-29 DIAGNOSIS — I4901 Ventricular fibrillation: Secondary | ICD-10-CM | POA: Diagnosis not present

## 2019-10-29 DIAGNOSIS — Z7902 Long term (current) use of antithrombotics/antiplatelets: Secondary | ICD-10-CM

## 2019-10-29 DIAGNOSIS — K2100 Gastro-esophageal reflux disease with esophagitis, without bleeding: Secondary | ICD-10-CM | POA: Diagnosis present

## 2019-10-29 DIAGNOSIS — Z7901 Long term (current) use of anticoagulants: Secondary | ICD-10-CM

## 2019-10-29 DIAGNOSIS — K635 Polyp of colon: Secondary | ICD-10-CM | POA: Diagnosis present

## 2019-10-29 DIAGNOSIS — I5040 Unspecified combined systolic (congestive) and diastolic (congestive) heart failure: Secondary | ICD-10-CM | POA: Diagnosis not present

## 2019-10-29 DIAGNOSIS — E785 Hyperlipidemia, unspecified: Secondary | ICD-10-CM | POA: Diagnosis not present

## 2019-10-29 DIAGNOSIS — Z951 Presence of aortocoronary bypass graft: Secondary | ICD-10-CM

## 2019-10-29 DIAGNOSIS — I25119 Atherosclerotic heart disease of native coronary artery with unspecified angina pectoris: Secondary | ICD-10-CM | POA: Diagnosis present

## 2019-10-29 DIAGNOSIS — Z515 Encounter for palliative care: Secondary | ICD-10-CM | POA: Diagnosis not present

## 2019-10-29 DIAGNOSIS — D62 Acute posthemorrhagic anemia: Secondary | ICD-10-CM | POA: Diagnosis present

## 2019-10-29 DIAGNOSIS — F419 Anxiety disorder, unspecified: Secondary | ICD-10-CM | POA: Diagnosis present

## 2019-10-29 DIAGNOSIS — I480 Paroxysmal atrial fibrillation: Secondary | ICD-10-CM

## 2019-10-29 DIAGNOSIS — N184 Chronic kidney disease, stage 4 (severe): Secondary | ICD-10-CM | POA: Diagnosis present

## 2019-10-29 DIAGNOSIS — K2971 Gastritis, unspecified, with bleeding: Secondary | ICD-10-CM | POA: Diagnosis present

## 2019-10-29 DIAGNOSIS — Z9911 Dependence on respirator [ventilator] status: Secondary | ICD-10-CM | POA: Diagnosis not present

## 2019-10-29 DIAGNOSIS — G92 Toxic encephalopathy: Secondary | ICD-10-CM | POA: Diagnosis not present

## 2019-10-29 DIAGNOSIS — E1122 Type 2 diabetes mellitus with diabetic chronic kidney disease: Secondary | ICD-10-CM | POA: Diagnosis present

## 2019-10-29 DIAGNOSIS — G9341 Metabolic encephalopathy: Secondary | ICD-10-CM | POA: Diagnosis not present

## 2019-10-29 DIAGNOSIS — I951 Orthostatic hypotension: Secondary | ICD-10-CM | POA: Diagnosis present

## 2019-10-29 DIAGNOSIS — Z01818 Encounter for other preprocedural examination: Secondary | ICD-10-CM

## 2019-10-29 DIAGNOSIS — Z8719 Personal history of other diseases of the digestive system: Secondary | ICD-10-CM

## 2019-10-29 DIAGNOSIS — N170 Acute kidney failure with tubular necrosis: Secondary | ICD-10-CM | POA: Diagnosis not present

## 2019-10-29 DIAGNOSIS — R109 Unspecified abdominal pain: Secondary | ICD-10-CM

## 2019-10-29 DIAGNOSIS — N390 Urinary tract infection, site not specified: Secondary | ICD-10-CM | POA: Diagnosis not present

## 2019-10-29 DIAGNOSIS — E875 Hyperkalemia: Secondary | ICD-10-CM | POA: Diagnosis not present

## 2019-10-29 DIAGNOSIS — E039 Hypothyroidism, unspecified: Secondary | ICD-10-CM

## 2019-10-29 DIAGNOSIS — Z905 Acquired absence of kidney: Secondary | ICD-10-CM

## 2019-10-29 DIAGNOSIS — K921 Melena: Secondary | ICD-10-CM | POA: Diagnosis not present

## 2019-10-29 DIAGNOSIS — G4733 Obstructive sleep apnea (adult) (pediatric): Secondary | ICD-10-CM

## 2019-10-29 DIAGNOSIS — I130 Hypertensive heart and chronic kidney disease with heart failure and stage 1 through stage 4 chronic kidney disease, or unspecified chronic kidney disease: Secondary | ICD-10-CM | POA: Diagnosis present

## 2019-10-29 DIAGNOSIS — R471 Dysarthria and anarthria: Secondary | ICD-10-CM | POA: Diagnosis not present

## 2019-10-29 DIAGNOSIS — Z85528 Personal history of other malignant neoplasm of kidney: Secondary | ICD-10-CM

## 2019-10-29 DIAGNOSIS — R9431 Abnormal electrocardiogram [ECG] [EKG]: Secondary | ICD-10-CM | POA: Diagnosis not present

## 2019-10-29 DIAGNOSIS — Z8249 Family history of ischemic heart disease and other diseases of the circulatory system: Secondary | ICD-10-CM

## 2019-10-29 DIAGNOSIS — E873 Alkalosis: Secondary | ICD-10-CM | POA: Diagnosis not present

## 2019-10-29 DIAGNOSIS — I429 Cardiomyopathy, unspecified: Secondary | ICD-10-CM | POA: Diagnosis present

## 2019-10-29 DIAGNOSIS — F32A Depression, unspecified: Secondary | ICD-10-CM | POA: Diagnosis present

## 2019-10-29 DIAGNOSIS — I714 Abdominal aortic aneurysm, without rupture, unspecified: Secondary | ICD-10-CM | POA: Diagnosis present

## 2019-10-29 DIAGNOSIS — Z5181 Encounter for therapeutic drug level monitoring: Secondary | ICD-10-CM | POA: Diagnosis not present

## 2019-10-29 DIAGNOSIS — R079 Chest pain, unspecified: Secondary | ICD-10-CM

## 2019-10-29 DIAGNOSIS — E1142 Type 2 diabetes mellitus with diabetic polyneuropathy: Secondary | ICD-10-CM

## 2019-10-29 DIAGNOSIS — J9600 Acute respiratory failure, unspecified whether with hypoxia or hypercapnia: Secondary | ICD-10-CM

## 2019-10-29 DIAGNOSIS — I5021 Acute systolic (congestive) heart failure: Secondary | ICD-10-CM | POA: Diagnosis not present

## 2019-10-29 DIAGNOSIS — Z66 Do not resuscitate: Secondary | ICD-10-CM | POA: Diagnosis not present

## 2019-10-29 DIAGNOSIS — R4701 Aphasia: Secondary | ICD-10-CM | POA: Diagnosis not present

## 2019-10-29 DIAGNOSIS — E1165 Type 2 diabetes mellitus with hyperglycemia: Secondary | ICD-10-CM | POA: Diagnosis not present

## 2019-10-29 DIAGNOSIS — R0902 Hypoxemia: Secondary | ICD-10-CM

## 2019-10-29 DIAGNOSIS — J811 Chronic pulmonary edema: Secondary | ICD-10-CM

## 2019-10-29 DIAGNOSIS — Z6838 Body mass index (BMI) 38.0-38.9, adult: Secondary | ICD-10-CM

## 2019-10-29 DIAGNOSIS — Z955 Presence of coronary angioplasty implant and graft: Secondary | ICD-10-CM

## 2019-10-29 DIAGNOSIS — I472 Ventricular tachycardia: Secondary | ICD-10-CM | POA: Diagnosis not present

## 2019-10-29 DIAGNOSIS — I462 Cardiac arrest due to underlying cardiac condition: Secondary | ICD-10-CM | POA: Diagnosis not present

## 2019-10-29 DIAGNOSIS — K7200 Acute and subacute hepatic failure without coma: Secondary | ICD-10-CM | POA: Diagnosis not present

## 2019-10-29 DIAGNOSIS — Z79899 Other long term (current) drug therapy: Secondary | ICD-10-CM

## 2019-10-29 DIAGNOSIS — G8929 Other chronic pain: Secondary | ICD-10-CM | POA: Diagnosis present

## 2019-10-29 DIAGNOSIS — E871 Hypo-osmolality and hyponatremia: Secondary | ICD-10-CM | POA: Diagnosis not present

## 2019-10-29 DIAGNOSIS — R413 Other amnesia: Secondary | ICD-10-CM | POA: Diagnosis present

## 2019-10-29 DIAGNOSIS — I513 Intracardiac thrombosis, not elsewhere classified: Secondary | ICD-10-CM | POA: Diagnosis present

## 2019-10-29 DIAGNOSIS — N1832 Chronic kidney disease, stage 3b: Secondary | ICD-10-CM | POA: Diagnosis not present

## 2019-10-29 DIAGNOSIS — R778 Other specified abnormalities of plasma proteins: Secondary | ICD-10-CM

## 2019-10-29 DIAGNOSIS — E669 Obesity, unspecified: Secondary | ICD-10-CM | POA: Diagnosis present

## 2019-10-29 DIAGNOSIS — K317 Polyp of stomach and duodenum: Secondary | ICD-10-CM | POA: Diagnosis present

## 2019-10-29 DIAGNOSIS — Z978 Presence of other specified devices: Secondary | ICD-10-CM

## 2019-10-29 DIAGNOSIS — K922 Gastrointestinal hemorrhage, unspecified: Secondary | ICD-10-CM | POA: Diagnosis not present

## 2019-10-29 DIAGNOSIS — J9601 Acute respiratory failure with hypoxia: Secondary | ICD-10-CM | POA: Diagnosis not present

## 2019-10-29 DIAGNOSIS — N183 Chronic kidney disease, stage 3 unspecified: Secondary | ICD-10-CM | POA: Diagnosis present

## 2019-10-29 DIAGNOSIS — E11649 Type 2 diabetes mellitus with hypoglycemia without coma: Secondary | ICD-10-CM | POA: Diagnosis present

## 2019-10-29 DIAGNOSIS — T402X5A Adverse effect of other opioids, initial encounter: Secondary | ICD-10-CM | POA: Diagnosis present

## 2019-10-29 DIAGNOSIS — I469 Cardiac arrest, cause unspecified: Secondary | ICD-10-CM | POA: Diagnosis not present

## 2019-10-29 DIAGNOSIS — E876 Hypokalemia: Secondary | ICD-10-CM | POA: Diagnosis not present

## 2019-10-29 DIAGNOSIS — I25708 Atherosclerosis of coronary artery bypass graft(s), unspecified, with other forms of angina pectoris: Secondary | ICD-10-CM | POA: Diagnosis not present

## 2019-10-29 DIAGNOSIS — I214 Non-ST elevation (NSTEMI) myocardial infarction: Secondary | ICD-10-CM | POA: Diagnosis present

## 2019-10-29 DIAGNOSIS — K641 Second degree hemorrhoids: Secondary | ICD-10-CM | POA: Diagnosis present

## 2019-10-29 DIAGNOSIS — K5903 Drug induced constipation: Secondary | ICD-10-CM | POA: Diagnosis present

## 2019-10-29 DIAGNOSIS — I200 Unstable angina: Secondary | ICD-10-CM | POA: Diagnosis not present

## 2019-10-29 DIAGNOSIS — D649 Anemia, unspecified: Secondary | ICD-10-CM | POA: Diagnosis not present

## 2019-10-29 DIAGNOSIS — I10 Essential (primary) hypertension: Secondary | ICD-10-CM | POA: Diagnosis not present

## 2019-10-29 DIAGNOSIS — K449 Diaphragmatic hernia without obstruction or gangrene: Secondary | ICD-10-CM | POA: Diagnosis present

## 2019-10-29 DIAGNOSIS — Z85118 Personal history of other malignant neoplasm of bronchus and lung: Secondary | ICD-10-CM

## 2019-10-29 DIAGNOSIS — R278 Other lack of coordination: Secondary | ICD-10-CM | POA: Diagnosis not present

## 2019-10-29 DIAGNOSIS — I5043 Acute on chronic combined systolic (congestive) and diastolic (congestive) heart failure: Secondary | ICD-10-CM | POA: Diagnosis not present

## 2019-10-29 DIAGNOSIS — R195 Other fecal abnormalities: Secondary | ICD-10-CM | POA: Diagnosis not present

## 2019-10-29 DIAGNOSIS — R7989 Other specified abnormal findings of blood chemistry: Secondary | ICD-10-CM

## 2019-10-29 LAB — GLUCOSE, CAPILLARY: Glucose-Capillary: 334 mg/dL — ABNORMAL HIGH (ref 70–99)

## 2019-10-29 LAB — BASIC METABOLIC PANEL
Anion gap: 11 (ref 5–15)
BUN: 22 mg/dL (ref 8–23)
CO2: 23 mmol/L (ref 22–32)
Calcium: 8.5 mg/dL — ABNORMAL LOW (ref 8.9–10.3)
Chloride: 102 mmol/L (ref 98–111)
Creatinine, Ser: 2.13 mg/dL — ABNORMAL HIGH (ref 0.61–1.24)
GFR calc Af Amer: 34 mL/min — ABNORMAL LOW (ref >60)
GFR calc non Af Amer: 29 mL/min — ABNORMAL LOW (ref >60)
Glucose, Bld: 243 mg/dL — ABNORMAL HIGH (ref 70–99)
Potassium: 4.5 mmol/L (ref 3.5–5.1)
Sodium: 136 mmol/L (ref 135–145)

## 2019-10-29 LAB — TROPONIN I (HIGH SENSITIVITY)
Troponin I (High Sensitivity): 61 ng/L — ABNORMAL HIGH (ref <18)
Troponin I (High Sensitivity): 2635 ng/L (ref <18)
Troponin I (High Sensitivity): 4646 ng/L (ref <18)

## 2019-10-29 LAB — HEMOGLOBIN AND HEMATOCRIT, BLOOD
HCT: 30.1 % — ABNORMAL LOW (ref 39.0–52.0)
HCT: 29.4 % — ABNORMAL LOW (ref 39.0–52.0)
HCT: 32.4 % — ABNORMAL LOW (ref 39.0–52.0)
Hemoglobin: 8.9 g/dL — ABNORMAL LOW (ref 13.0–17.0)
Hemoglobin: 8.9 g/dL — ABNORMAL LOW (ref 13.0–17.0)
Hemoglobin: 9.7 g/dL — ABNORMAL LOW (ref 13.0–17.0)

## 2019-10-29 LAB — CBG MONITORING, ED
Glucose-Capillary: 294 mg/dL — ABNORMAL HIGH (ref 70–99)
Glucose-Capillary: 262 mg/dL — ABNORMAL HIGH (ref 70–99)
Glucose-Capillary: 264 mg/dL — ABNORMAL HIGH (ref 70–99)
Glucose-Capillary: 290 mg/dL — ABNORMAL HIGH (ref 70–99)

## 2019-10-29 LAB — CBC
HCT: 29.7 % — ABNORMAL LOW (ref 39.0–52.0)
Hemoglobin: 8.8 g/dL — ABNORMAL LOW (ref 13.0–17.0)
MCH: 29 pg (ref 26.0–34.0)
MCHC: 29.6 g/dL — ABNORMAL LOW (ref 30.0–36.0)
MCV: 98 fL (ref 80.0–100.0)
Platelets: 347 10*3/uL (ref 150–400)
RBC: 3.03 MIL/uL — ABNORMAL LOW (ref 4.22–5.81)
RDW: 14.3 % (ref 11.5–15.5)
WBC: 9 10*3/uL (ref 4.0–10.5)
nRBC: 0 % (ref 0.0–0.2)

## 2019-10-29 LAB — POC OCCULT BLOOD, ED: Fecal Occult Bld: POSITIVE — AB

## 2019-10-29 LAB — HEMOGLOBIN A1C
Hgb A1c MFr Bld: 6.6 % — ABNORMAL HIGH (ref 4.8–5.6)
Mean Plasma Glucose: 142.72 mg/dL

## 2019-10-29 LAB — SARS CORONAVIRUS 2 BY RT PCR (HOSPITAL ORDER, PERFORMED IN ~~LOC~~ HOSPITAL LAB): SARS Coronavirus 2: NEGATIVE

## 2019-10-29 MED ORDER — SODIUM CHLORIDE 0.9% FLUSH
3.0000 mL | Freq: Once | INTRAVENOUS | Status: AC
  Administered 2019-10-29: 3 mL via INTRAVENOUS

## 2019-10-29 MED ORDER — MEMANTINE HCL 10 MG PO TABS: 10.0000 mg | ORAL_TABLET | Freq: Two times a day (BID) | ORAL | Status: DC

## 2019-10-29 MED ORDER — SENNA 8.6 MG PO TABS
1.0000 | ORAL_TABLET | Freq: Every day | ORAL | Status: DC
  Administered 2019-10-29 – 2019-11-04 (×6): 8.6 mg via ORAL
  Filled 2019-10-29 (×8): qty 1

## 2019-10-29 MED ORDER — ROSUVASTATIN CALCIUM 20 MG PO TABS
40.0000 mg | ORAL_TABLET | Freq: Every day | ORAL | Status: DC
  Administered 2019-10-29 – 2019-11-02 (×5): 40 mg via ORAL
  Filled 2019-10-29 (×5): qty 2

## 2019-10-29 MED ORDER — MIDODRINE HCL 5 MG PO TABS
2.5000 mg | ORAL_TABLET | Freq: Three times a day (TID) | ORAL | Status: DC
  Administered 2019-10-29 – 2019-10-30 (×4): 2.5 mg via ORAL
  Filled 2019-10-29 (×5): qty 1

## 2019-10-29 MED ORDER — ACETAMINOPHEN 325 MG PO TABS
650.0000 mg | ORAL_TABLET | ORAL | Status: DC | PRN
  Administered 2019-11-05 – 2019-11-11 (×2): 650 mg via ORAL
  Filled 2019-10-29 (×2): qty 2

## 2019-10-29 MED ORDER — EZETIMIBE 10 MG PO TABS
5.0000 mg | ORAL_TABLET | Freq: Every day | ORAL | Status: DC
  Administered 2019-10-29 – 2019-11-05 (×7): 5 mg via ORAL
  Filled 2019-10-29: qty 1
  Filled 2019-10-29: qty 0.5
  Filled 2019-10-29 (×6): qty 1

## 2019-10-29 MED ORDER — FINASTERIDE 5 MG PO TABS
5.0000 mg | ORAL_TABLET | Freq: Every day | ORAL | Status: DC
  Administered 2019-10-30 – 2019-11-09 (×11): 5 mg via ORAL
  Filled 2019-10-29 (×12): qty 1

## 2019-10-29 MED ORDER — BUPROPION HCL ER (XL) 150 MG PO TB24
300.0000 mg | ORAL_TABLET | Freq: Every day | ORAL | Status: DC
  Administered 2019-10-29 – 2019-11-04 (×7): 300 mg via ORAL
  Filled 2019-10-29 (×7): qty 2

## 2019-10-29 MED ORDER — TRIAMCINOLONE ACETONIDE 0.1 % EX CREA
1.0000 "application " | TOPICAL_CREAM | Freq: Every day | CUTANEOUS | Status: DC | PRN
  Filled 2019-10-29: qty 15

## 2019-10-29 MED ORDER — INSULIN ASPART 100 UNIT/ML ~~LOC~~ SOLN
0.0000 [IU] | Freq: Three times a day (TID) | SUBCUTANEOUS | Status: DC
  Administered 2019-10-29: 8 [IU] via SUBCUTANEOUS
  Administered 2019-10-30: 3 [IU] via SUBCUTANEOUS
  Administered 2019-10-30 (×2): 5 [IU] via SUBCUTANEOUS
  Administered 2019-10-31: 8 [IU] via SUBCUTANEOUS
  Administered 2019-10-31: 5 [IU] via SUBCUTANEOUS
  Administered 2019-10-31: 2 [IU] via SUBCUTANEOUS
  Administered 2019-11-01: 3 [IU] via SUBCUTANEOUS
  Administered 2019-11-01: 8 [IU] via SUBCUTANEOUS
  Administered 2019-11-01: 5 [IU] via SUBCUTANEOUS
  Administered 2019-11-02 (×2): 3 [IU] via SUBCUTANEOUS
  Administered 2019-11-03: 2 [IU] via SUBCUTANEOUS
  Administered 2019-11-03 – 2019-11-04 (×2): 5 [IU] via SUBCUTANEOUS
  Administered 2019-11-04: 3 [IU] via SUBCUTANEOUS
  Administered 2019-11-04: 5 [IU] via SUBCUTANEOUS
  Administered 2019-11-05: 8 [IU] via SUBCUTANEOUS
  Administered 2019-11-05: 2 [IU] via SUBCUTANEOUS
  Administered 2019-11-06: 3 [IU] via SUBCUTANEOUS

## 2019-10-29 MED ORDER — LEVOTHYROXINE SODIUM 25 MCG PO TABS
150.0000 ug | ORAL_TABLET | Freq: Every day | ORAL | Status: DC
  Administered 2019-10-30 – 2019-11-05 (×7): 150 ug via ORAL
  Filled 2019-10-29 (×8): qty 2

## 2019-10-29 MED ORDER — TAMSULOSIN HCL 0.4 MG PO CAPS
0.4000 mg | ORAL_CAPSULE | Freq: Every day | ORAL | Status: DC
  Administered 2019-10-29 – 2019-11-05 (×8): 0.4 mg via ORAL
  Filled 2019-10-29 (×8): qty 1

## 2019-10-29 MED ORDER — INSULIN DETEMIR 100 UNIT/ML ~~LOC~~ SOLN
25.0000 [IU] | Freq: Two times a day (BID) | SUBCUTANEOUS | Status: DC
  Administered 2019-10-29 – 2019-11-11 (×23): 25 [IU] via SUBCUTANEOUS
  Filled 2019-10-29 (×29): qty 0.25

## 2019-10-29 MED ORDER — FENTANYL CITRATE (PF) 100 MCG/2ML IJ SOLN
50.0000 ug | Freq: Once | INTRAMUSCULAR | Status: AC
  Administered 2019-10-29: 50 ug via INTRAVENOUS
  Filled 2019-10-29: qty 2

## 2019-10-29 MED ORDER — OXYCODONE HCL 5 MG PO TABS
5.0000 mg | ORAL_TABLET | Freq: Four times a day (QID) | ORAL | Status: DC | PRN
  Administered 2019-10-29 – 2019-11-02 (×9): 5 mg via ORAL
  Filled 2019-10-29 (×9): qty 1

## 2019-10-29 MED ORDER — ONDANSETRON HCL 4 MG/2ML IJ SOLN: 4.0000 mg | Freq: Four times a day (QID) | INTRAMUSCULAR | Status: DC | PRN

## 2019-10-29 MED ORDER — INSULIN ASPART 100 UNIT/ML ~~LOC~~ SOLN
0.0000 [IU] | Freq: Every day | SUBCUTANEOUS | Status: DC
  Administered 2019-10-30: 4 [IU] via SUBCUTANEOUS
  Administered 2019-10-30 – 2019-11-05 (×4): 2 [IU] via SUBCUTANEOUS

## 2019-10-29 MED ORDER — ACETAMINOPHEN 500 MG PO TABS
1000.0000 mg | ORAL_TABLET | Freq: Once | ORAL | Status: AC
  Administered 2019-10-29: 1000 mg via ORAL
  Filled 2019-10-29: qty 2

## 2019-10-29 MED ORDER — DRONEDARONE HCL 400 MG PO TABS
400.0000 mg | ORAL_TABLET | Freq: Two times a day (BID) | ORAL | Status: DC
  Administered 2019-10-29 – 2019-11-04 (×12): 400 mg via ORAL
  Filled 2019-10-29 (×15): qty 1

## 2019-10-29 MED ORDER — MELATONIN 3 MG PO TABS
3.0000 mg | ORAL_TABLET | Freq: Every day | ORAL | Status: DC
  Administered 2019-10-29 – 2019-11-05 (×7): 3 mg via ORAL
  Filled 2019-10-29 (×8): qty 1

## 2019-10-29 MED ORDER — LIDOCAINE 5 % EX OINT
1.0000 "application " | TOPICAL_OINTMENT | Freq: Two times a day (BID) | CUTANEOUS | Status: DC | PRN
  Filled 2019-10-29: qty 35.44

## 2019-10-29 MED ORDER — METOPROLOL SUCCINATE ER 25 MG PO TB24
12.5000 mg | ORAL_TABLET | Freq: Every day | ORAL | Status: DC
  Administered 2019-10-29 – 2019-11-02 (×5): 12.5 mg via ORAL
  Filled 2019-10-29 (×6): qty 1

## 2019-10-29 MED ORDER — MAGNESIUM OXIDE 400 (241.3 MG) MG PO TABS
400.0000 mg | ORAL_TABLET | Freq: Two times a day (BID) | ORAL | Status: DC
  Administered 2019-10-29 – 2019-11-04 (×12): 400 mg via ORAL
  Filled 2019-10-29 (×12): qty 1

## 2019-10-29 MED ORDER — SENNA 8.6 MG PO TABS: 1.0000 | ORAL_TABLET | ORAL | Status: DC

## 2019-10-29 MED ORDER — OXYCODONE-ACETAMINOPHEN 5-325 MG PO TABS
1.0000 | ORAL_TABLET | Freq: Four times a day (QID) | ORAL | Status: DC | PRN
  Administered 2019-10-29 – 2019-11-03 (×9): 1 via ORAL
  Filled 2019-10-29 (×9): qty 1

## 2019-10-29 MED ORDER — OXYCODONE-ACETAMINOPHEN 10-325 MG PO TABS: 1.0000 | ORAL_TABLET | Freq: Four times a day (QID) | ORAL | Status: DC | PRN

## 2019-10-29 MED ORDER — GABAPENTIN 300 MG PO CAPS
300.0000 mg | ORAL_CAPSULE | Freq: Two times a day (BID) | ORAL | Status: DC
  Administered 2019-10-29 – 2019-11-04 (×12): 300 mg via ORAL
  Filled 2019-10-29 (×12): qty 1

## 2019-10-29 MED ORDER — ALUM & MAG HYDROXIDE-SIMETH 200-200-20 MG/5ML PO SUSP
30.0000 mL | Freq: Four times a day (QID) | ORAL | Status: DC | PRN
  Administered 2019-11-01 – 2019-11-03 (×2): 30 mL via ORAL
  Filled 2019-10-29 (×2): qty 30

## 2019-10-29 MED ORDER — FERROUS SULFATE 325 (65 FE) MG PO TABS
325.0000 mg | ORAL_TABLET | ORAL | Status: DC
  Administered 2019-10-30 – 2019-11-04 (×3): 325 mg via ORAL
  Filled 2019-10-29 (×5): qty 1

## 2019-10-29 MED ORDER — SODIUM CHLORIDE 0.9 % IV SOLN: Freq: Once | INTRAVENOUS | Status: AC

## 2019-10-29 MED ORDER — SENNA 8.6 MG PO TABS
2.0000 | ORAL_TABLET | Freq: Every morning | ORAL | Status: DC
  Administered 2019-10-29 – 2019-11-04 (×7): 17.2 mg via ORAL
  Filled 2019-10-29 (×7): qty 2

## 2019-10-29 NOTE — Progress Notes (Signed)
  Echocardiogram 2D Echocardiogram has been attempted. Patient leaving floor. Will reattempt at later time.  Randa Lynn Merrit Waugh 10/24/2019, 3:17 PM

## 2019-10-29 NOTE — Consult Note (Addendum)
[facility] Consult: 9:33 AM 10/15/2019  LOS: 0 days    Referring Provider: Dr Harvest Forest  Primary Care Physician:  Marda Stalker, PA-C Primary Gastroenterologist:  Dr. Hilarie Fredrickson.  Has not seen Dr. Elmo Putt since 11/2014 office visit.    Reason for Consultation:  Anemia.  FOBT +   HPI: Matthew [name] is a 74 y.o. male.  PMH remote proctitis.  Colon adenomas. Chronic, opioid induced constipation.  Chronic abdominal pain.  Fatty liver. Cirrhosis? But none on recent CT or 2016 ultrasound.  Fibrosis score F3/F4.  CAD.  OSA s/p nephrectomy for renal cell carcinoma.  Obesity. Hypertension.  Diabetes.  Hyperlipidemia.  Abdominal aortic aneurysm.  Iron deficiency anemia 2011.  CKD.  On chronic Plavix and Eliquis.  01/2014 colonoscopy.  For rectal bleeding, abdominal pain, constipation.  History of proctitis.  A total of 5, 3 to 5 mm sized, sessile polyps in a sending, transverse, sigmoid, rectosigmoid.  Otherwise normal study, no proctitis/colitis.  Pathology confirmed tubular adenoma as well as fragments of benign colonic mucosa.  No dysplasia or malignancy 11/2014 EGD.  For history NASH, esophagus abnormal on CT.  2 cm segment suspicious for Barrett's in lower esophagus.  Large and small, multiple gastric polyps throughout the stomach, sampled/biopsied.  Otherwise normal study to D2.  Pathology of polyps showed reactive type gastropathy, no H. pylori, another polyp showed hyperplastic changes.  Distal esophagus pathology showed mild inflammation consistent with GERD but no goblet cell metaplasia, dysplasia, malignancy.  10/2018 CT chest/ab/pelvis with angio: 3 x 3.2 cm distal aortic aneurysm.  Right common iliac stenosis.  Hiatal hernia.  Bilateral, small, fat-containing inguinal hernias.  Liver described as normal.  Brought  to ED with 10/10 chest pain, radiating to left arm, shortness of breath that began this morning.  Not much relief after he took nitroglycerin at home.  Pain is better after receiving Percocet in the ED.. Troponins 61 >> 98. Hgb 8.8 (15.1 on 08/22/19).  MCV 98.  FOBT +.   Glucose 243.  Patient takes chronic oxycodone, oral iron and suffers from chronic constipation he treats with senna.  Last BM 2 days ago.  Denies bloody or black stools.  A few days ago patient took oxycodone in the morning on an empty stomach and had brief nausea and dry heaves but this is unusual for him.  No recurrent upset stomach or nausea since then.  Good appetite.  No abdominal pain.   Past Medical History:  Diagnosis Date  . AAA (abdominal aortic aneurysm) ([place])    a. Aortic US 05/2012: 3.9 cm fusiform abdominal aortic aneurysm. F/u recommended 05/2014.  Marland Kitchen Anemia   . Arthritis    DDD  . Atrial fibrillation ([place])    a. Post-op CABG 05/2009. b. Re-identified 12/2013.   Marland Kitchen CAD (coronary artery disease)    a. s/p CABGx4 05/2009. 10/10/16 PCI with DES x1 SVG-->OM2/OM3  . Chronic constipation   . CKD (chronic kidney disease), stage III   . Cocaine abuse in remission ([place])   . Depression   . Diabetes mellitus   .  Empyema lung ([place])    a. PNA 02/2011 c/b empyema requiring chest tube, drainage of pleural effusion and decortication.  . Frequent falls   . GERD (gastroesophageal reflux disease)   . Hepatic steatosis   . Hyperlipidemia   . Hypertension   . Hypothyroidism   . Iron deficiency anemia   . LAFB (left anterior fascicular block)   . Lung cancer ([place])    a. Tx with interleukin in 2002 per patient.  . Nodule of left lung    a. Cavitary nodules 02/2011, f/u recommended.  . Obesity   . Post concussion syndrome   . RBBB   . Renal cell carcinoma    a. s/p R nephrectomy 2002. Reported pulmonary mets per note at that time.  . Scrotal abscess    a. 2012 2/2 Streptococcus group C, status post incision and drainage.    . Sleep apnea    wears CPAP  . 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  . Vitamin D deficiency   . Wears dentures     Past Surgical History:  Procedure Laterality Date  . Bronchoscopy, right video-assisted thoracoscopy, minithoracotomy, drainage of pleural effusion, and decortication.  02/26/2011   [facility]  . CABG x 4  05/13/2009   Hendrickson  . CARDIAC SURGERY    . COLONOSCOPY W/ BIOPSIES AND POLYPECTOMY    . CORONARY STENT INTERVENTION N/A 10/10/2016   Procedure: Coronary Stent Intervention;  Surgeon: Troy Sine, MD;  Location: [facility];  Service: Cardiovascular;  Laterality: N/A;  . CORONARY STENT INTERVENTION N/A 10/15/2018   Procedure: CORONARY STENT INTERVENTION;  Surgeon: Burnell Blanks, MD;  Location: [facility];  Service: Cardiovascular;  Laterality: N/A;  . HEMORRHOID SURGERY    . HERNIA REPAIR    . Incision, drainage and debridement of perineum and  scrotum    . Irrigation and debridement of scrotal wound and closure of scrotal wound.    Marland Kitchen LEFT HEART CATH AND CORS/GRAFTS ANGIOGRAPHY N/A 10/10/2016   Procedure: Left Heart Cath and Cors/Grafts Angiography;  Surgeon: Troy Sine, MD;  Location: [facility];  Service: Cardiovascular;  Laterality: N/A;  . LEFT HEART CATH AND CORS/GRAFTS ANGIOGRAPHY N/A 10/15/2018   Procedure: LEFT HEART CATH AND CORS/GRAFTS ANGIOGRAPHY;  Surgeon: Burnell Blanks, MD;  Location: [facility];  Service: Cardiovascular;  Laterality: N/A;  . MULTIPLE TOOTH EXTRACTIONS    . RADIOLOGY WITH ANESTHESIA N/A 05/09/2017   Procedure: MRI WITH ANESTHESIA   BRAIN WITHOUT CONTRAST;  Surgeon: Radiologist, Medication, MD;  Location: [facility];  Service: Radiology;  Laterality: N/A;  . Right radical nephrectomy.    . TOOTH EXTRACTION      Prior to Admission medications   Medication Sig Start Date End Date Taking? Authorizing Provider  apixaban (ELIQUIS) 5 MG TABS tablet Take 5 mg by mouth  2 (two) times daily.   Yes [provider]  buPROPion (WELLBUTRIN XL) 300 MG 24 hr tablet Take 300 mg by mouth daily.   Yes [provider]  clopidogrel (PLAVIX) 75 MG tablet Take 1 tablet (75 mg total) by mouth daily with breakfast. 10/16/18  Yes Rai, Ripudeep K, MD  dronedarone (MULTAQ) 400 MG tablet Take 400 mg by mouth 2 (two) times daily with a meal.   Yes [provider]  ezetimibe (ZETIA) 10 MG tablet Take 5 mg by mouth at bedtime.   Yes [provider]  ferrous sulfate 325 (65  FE) MG tablet Take 325 mg by mouth every Monday, Wednesday, and Friday.    Yes [provider]  finasteride (PROSCAR) 5 MG tablet Take 5 mg by mouth at bedtime.    Yes [provider]  gabapentin (NEURONTIN) 300 MG capsule Take 300 mg by mouth 2 (two) times daily.    Yes [provider]  Insulin Detemir (LEVEMIR FLEXPEN) 100 UNIT/ML Pen Inject 50 Units into the skin 2 (two) times daily.    Yes [provider]  levothyroxine (SYNTHROID) 150 MCG tablet Take 150 mcg by mouth daily before breakfast.    Yes [provider]  lidocaine (XYLOCAINE) 5 % ointment Apply 1 application topically 2 (two) times daily as needed (Neuropathy). Apply to feet 10/26/19  Yes [provider]  magnesium oxide (MAG-OX) 400 MG tablet Take 400 mg by mouth in the morning, at noon, and at bedtime.    Yes [provider]  Melatonin 3 MG CAPS Take 1 capsule by mouth at bedtime.   Yes [provider]  metoprolol succinate (TOPROL-XL) 25 MG 24 hr tablet Take 12.5 mg by mouth daily.   Yes [provider]  midodrine (PROAMATINE) 2.5 MG tablet Take 2.5 mg by mouth 3 (three) times daily. 10/22/19  Yes [provider]  nortriptyline (PAMELOR) 25 MG capsule Take 50 mg by mouth at bedtime.    Yes [provider]  Omega-3 Fatty Acids (FISH OIL) 1000 MG CAPS Take 2,000 mg by mouth 2 (two) times daily.    Yes [provider]   oxyCODONE-acetaminophen (PERCOCET) 10-325 MG per tablet Take 1 tablet by mouth every 6 (six) hours as needed for pain.    Yes [provider]  RABEprazole (ACIPHEX) 20 MG tablet Take 20 mg by mouth in the morning and at bedtime.   Yes [provider]  rosuvastatin (CRESTOR) 40 MG tablet Take 40 mg by mouth at bedtime.    Yes [provider]  senna (SENOKOT) 8.6 MG TABS tablet Take 1-2 tablets by mouth See admin instructions. Take 2 tablets by mouth in the morning and 1 tablet at bedtime   Yes [provider]  sertraline (ZOLOFT) 25 MG tablet Take 25 mg by mouth every morning.   Yes [provider]  tamsulosin (FLOMAX) 0.4 MG CAPS capsule Take 0.4 mg by mouth daily.   Yes [provider]  triamcinolone cream (KENALOG) 0.1 % Apply 1 application topically daily as needed (rash).   Yes [provider]  VITAMIN D PO Take 1,000 Units by mouth daily.   Yes [provider]  memantine (NAMENDA) 10 MG tablet Take 10 mg by mouth 2 (two) times daily. 10/23/19   [provider]  traZODone (DESYREL) 100 MG tablet Take 100 mg by mouth at bedtime.    [provider]    Scheduled Meds: . insulin aspart  0-15 Units Subcutaneous TID WC  . insulin aspart  0-5 Units Subcutaneous QHS   Infusions: . sodium chloride     PRN Meds: acetaminophen, alum & mag hydroxide-simeth, ondansetron (ZOFRAN) IV, oxyCODONE-acetaminophen **AND** oxyCODONE   Allergies as of 10/19/2019 - Review Complete 11/01/2019  Allergen Reaction Noted  . Amiodarone Other (See Comments) 10/13/2018  . Tetanus toxoids Hives 12/20/2013    Family History  Problem Relation Age of Onset  . Heart disease Father   . Alzheimer's disease Mother   . Pneumonia Other   . Colon cancer Neg Hx   . Esophageal cancer Neg Hx   .  Stomach cancer Neg Hx   . Rectal cancer Neg Hx     Social History   Socioeconomic History  . Marital status: Married    Spouse  name: Not on file  . Number of children: 4  . Years of education: 14.5  . Highest education level: Not on file  Occupational History  . Occupation: CAR SERVICE    Employer: SELF EMPLOYED  Tobacco Use  . Smoking status: Never Smoker  . Smokeless tobacco: Never Used  Substance and Sexual Activity  . Alcohol use: No  . Drug use: Not Currently    Types: Cocaine    Comment: stopped cocaine maybe 10 years ago  . Sexual activity: Not on file  Other Topics Concern  . Not on file  Social History Narrative   Lives with wife and son.   Caffeine RAQ:TMAU daily   Right handed    Social Determinants of Health   Financial Resource Strain:   . Difficulty of Paying Living Expenses:   Food Insecurity:   . Worried About Charity fundraiser in the Last Year:   . Arboriculturist in the Last Year:   Transportation Needs:   . Film/video editor (Medical):   Marland Kitchen Lack of Transportation (Non-Medical):   Physical Activity:   . Days of Exercise per Week:   . Minutes of Exercise per Session:   Stress:   . Feeling of Stress :   Social Connections:   . Frequency of Communication with Friends and Family:   . Frequency of Social Gatherings with Friends and Family:   . Attends Religious Services:   . Active Member of Clubs or Organizations:   . Attends Archivist Meetings:   Marland Kitchen Marital Status:   Intimate Partner Violence:   . Fear of Current or Ex-Partner:   . Emotionally Abused:   Marland Kitchen Physically Abused:   . Sexually Abused:     REVIEW OF SYSTEMS: Constitutional: Generally has good energy without significant fatigue or weakness ENT:  No nose bleeds Pulm: Shortness of breath this morning, improved. CV: Chest pain as above no palpitations, no LE edema.  GU:  No hematuria, no frequency GI: See HPI Heme: Denies excessive or unusual bleeding or bruising.  Tends to get small purpura on the left forearm Transfusions: None. Neuro:  No headaches, no peripheral tingling or numbness.  No  syncope.  No dizziness Derm:  No itching, no rash or sores.  Endocrine:  No sweats or chills.  No polyuria or dysuria Immunization: Not queried. Travel:  None beyond local counties in last few months.    PHYSICAL EXAM: Vital signs in last 24 hours: Vitals:   11/07/2019 0700 10/14/2019 0715  BP: 125/85 (!) 139/95  Pulse: 75 (!) 101  Resp: 17 19  Temp:    SpO2: 94% 94%   Wt Readings from Last 3 Encounters:  09/29/19 124.5 kg  08/23/19 121.7 kg  11/17/18 118.8 kg    General: Patient looks chronically ill.  Laying comfortably on the stretcher. Head: No facial asymmetry or swelling.  No signs of head trauma. Eyes: No scleral icterus.  No conjunctival pallor. Ears: Not hard of hearing Nose: No discharge, no congestion Mouth: Oropharynx is moist, pink, clear, full dentures in place.  Tongue midline. Neck: No JVD, no masses, no thyromegaly Lungs: No labored breathing, no cough.  Lungs clear bilaterally. Heart: RRR.  No MRG.  S1, S2 present. Abdomen: Obese.  Soft.  Not tender.  Somewhat protuberant.  Active bowel sounds.  No hernias, bruits, organomegaly..   Rectal: Deferred Musc/Skeltl: No joint swelling, redness, or gross deformity. Extremities: No CCE. Neurologic: Oriented x3.  Good historian.  Moves all 4 limbs, strength not tested.  No contractures or tremors. Skin: No rash, no sores, a few purpura on the left forearm.  No significant bruising.  No wounds Nodes: No cervical adenopathy Psych: Cooperative, calm, pleasant.  Intake/Output from previous day: No intake/output data recorded. Intake/Output this shift: No intake/output data recorded.  LAB RESULTS: Recent Labs    10/25/2019 0453 10/11/2019 0725  WBC 9.0  --   HGB 8.8* 8.9*  HCT 29.7* 30.1*  PLT 347  --    BMET Lab Results  Component Value Date   NA 136 10/30/2019   NA 133 (L) 08/23/2019   NA 137 08/22/2019   K 4.5 11/04/2019   K 4.3 08/23/2019   K 4.0 08/22/2019   CL 102 11/08/2019   CL 99 08/23/2019   CL  105 08/22/2019   CO2 23 10/14/2019   CO2 24 08/23/2019   CO2 25 10/16/2018   GLUCOSE 243 (H) 10/25/2019   GLUCOSE 134 (H) 08/23/2019   GLUCOSE 140 (H) 08/22/2019   BUN 22 10/16/2019   BUN 25 (H) 08/23/2019   BUN 23 08/22/2019   CREATININE 2.13 (H) 10/28/2019   CREATININE 2.26 (H) 08/23/2019   CREATININE 2.10 (H) 08/22/2019   CALCIUM 8.5 (L) 10/23/2019   CALCIUM 9.2 08/23/2019   CALCIUM 8.7 (L) 10/16/2018   LFT No results for input(s): PROT, ALBUMIN, AST, ALT, ALKPHOS, BILITOT, BILIDIR, IBILI in the last 72 hours. PT/INR Lab Results  Component Value Date   INR 1.36 05/09/2017   INR 1.17 10/09/2016   INR 1.22 10/08/2016   Hepatitis Panel No results for input(s): HEPBSAG, HCVAB, HEPAIGM, HEPBIGM in the last 72 hours. C-Diff No components found for: CDIFF Lipase     Component Value Date/Time   LIPASE 17 10/13/2018 1343    Drugs of Abuse     Component Value Date/Time   LABOPIA NONE DETECTED 10/08/2016 1632   COCAINSCRNUR NONE DETECTED 10/08/2016 1632   LABBENZ NONE DETECTED 10/08/2016 1632   AMPHETMU NONE DETECTED 10/08/2016 1632   THCU NONE DETECTED 10/08/2016 1632   LABBARB NONE DETECTED 10/08/2016 1632     RADIOLOGY STUDIES: DG Chest 2 View  Result Date: 10/30/2019 CLINICAL DATA:  Chest pain. EXAM: CHEST - 2 VIEW COMPARISON:  08/22/2019 FINDINGS: The cardiac silhouette, mediastinal and hilar contours are within normal limits and stable. Stable surgical changes related to bypass surgery. Coronary artery stents are noted. Stable mild tortuosity and calcification of the thoracic aorta. No acute pulmonary findings. No pleural effusions. The bony thorax is intact. IMPRESSION: No acute cardiopulmonary findings. Electronically Signed   By: Marijo Sanes M.D.   On: 11/09/2019 05:33     IMPRESSION:   *   Substernal chest pain with left upper extremity radiation.  Elevated troponins.  Known, established diagnosis CAD.  Previous CABG 2010, cardiac stents in 2018, 2020.   Awaiting cardiology consult.  *   Normocytic anemia.  Takes oral iron 3 times a week.  *   Chronic Eliquis for A. Fib.   Chronic Plavix for CAD.  Both these meds are currently on hold.  *    FOBT positive.  No overt bleeding.  Remote history ulcerative proctitis in the late 1970s.  Colonoscopies in the last couple of decades have not confirmed ongoing proctitis or colitis.  Tubular adenomatous polyp  on latest colonoscopy 2015.  Multiple, benign gastric polyps 2015.  *   Fatty liver, NASH.  Fibrosis score F3/F4.  No visual evidence of cirrhosis on 2016 ultrasound or 10/2018 CT.    PLAN:     *   Colonsocopy and EGD when OK by cardiology  *    Anemia panel   Azucena Freed  10/18/2019, 9:33 AM Phone [phone]   ________________________________________________________________________  Velora Heckler GI MD note:  I personally examined the patient, reviewed the data and agree with the assessment and plan described above.  He has had a pretty rapid decline in Hb in the past 2 months (from 15 to 8.8) without overt GI bleeding.  He is on both eliquis and plavix for heart disease (CAD, afib). He is admitted now with chest pains, slightly elevated troponin. CArdiology has seen him and is planning eventual angiogram this admission but would like his anemia, FOBT + stool sorted out a bit first. They feel endoscopic testing should be safe. Will need to allow blood thinner washout; he took eliqis and plavix yesterday. WE will plan on colonoscopy/EGD on Monday (5 days for plavix washout).   We will plan to check on him on Sunday to get him ready for the procedures on Monday.  Please call or page sooner if any questions or concerns.  Thanks   Owens Loffler, MD [facility] Pager [phone]

## 2019-10-29 NOTE — Consult Note (Signed)
Cardiology Consultation:   Patient ID: Matthew [name] MRN: [id number]; DOB: 28-Nov-1945  Admit date: 10/14/2019 Date of Consult: 11/04/2019  Primary Care Provider: Marda Stalker, PA-C Primary Cardiologist: Matthew Grooms, MD  Primary Electrophysiologist:  None    Patient Profile:   Matthew [name] is a 74 y.o. male with a hx of coronary artery disease, CABG, hypertension, hyperlipidemia, paroxysmal atrial fibrillation, hypothyroidism, CKD who presents with chest pain and was found to have a GI bleed and mildly elevated troponins .  Who is being seen today for the evaluation of  Elevated troponins  at the request of  Matthew [name].  History of Present Illness:   Matthew [name] developed acute onset of chest discomfort last night.   Does not exercise due to severe arthritis.   He took 3 SL  nitroglycerin without any relief.   The pain did not feel like his presenting episodes of angina.    He presented to the emergency room.  He was found to have significant drop in his hemoglobin from 15.1 back in March down to 8.8.Was also found to have a mildly elevated troponin level at 61 which then later increased to 98.   Creatinine is 2.13.  Last heart catheterization was May, 2020 which revealed severe native coronary artery disease .  He had patent SVG to RCA,  LIMA to LAD, The SVG to diag is chronically occluded  The SVG to OM2, OM3 had a tight stenosis which was stented.   Has not seen had any red or melonic stools.  Is able to walk around his house without an cp Is on Plavix 75 mg a day and Eliquis 5 mg a day    Past Medical History:  Diagnosis Date  . AAA (abdominal aortic aneurysm) ([place])    a. Aortic US 05/2012: 3.9 cm fusiform abdominal aortic aneurysm. F/u recommended 05/2014.  Marland Kitchen Anemia   . Arthritis    DDD  . Atrial fibrillation ([place])    a. Post-op CABG 05/2009. b. Re-identified 12/2013.   Marland Kitchen CAD (coronary artery disease)    a. s/p CABGx4 05/2009. 10/10/16 PCI with DES x1  SVG-->OM2/OM3  . Chronic constipation   . CKD (chronic kidney disease), stage III   . Cocaine abuse in remission ([place])   . Depression   . Diabetes mellitus   . Empyema lung ([place])    a. PNA 02/2011 c/b empyema requiring chest tube, drainage of pleural effusion and decortication.  . Frequent falls   . GERD (gastroesophageal reflux disease)   . Hepatic steatosis   . Hyperlipidemia   . Hypertension   . Hypothyroidism   . Iron deficiency anemia   . LAFB (left anterior fascicular block)   . Lung cancer ([place])    a. Tx with interleukin in 2002 per patient.  . Nodule of left lung    a. Cavitary nodules 02/2011, f/u recommended.  . Obesity   . Post concussion syndrome   . RBBB   . Renal cell carcinoma    a. s/p R nephrectomy 2002. Reported pulmonary mets per note at that time.  . Scrotal abscess    a. 2012 2/2 Streptococcus group C, status post incision and drainage.  . Sleep apnea    wears CPAP  . Ulcerative proctitis ([place])    a. Remote dx dating back to 1979.  . Vitamin D deficiency   . Wears dentures     Past Surgical History:  Procedure Laterality Date  . Bronchoscopy, right video-assisted thoracoscopy, minithoracotomy, drainage  of pleural effusion, and decortication.  02/26/2011   [facility]  . CABG x 4  05/13/2009   [facility]  . CARDIAC SURGERY    . COLONOSCOPY W/ BIOPSIES AND POLYPECTOMY    . CORONARY STENT INTERVENTION N/A 10/10/2016   Procedure: Coronary Stent Intervention;  Surgeon: Troy Sine, MD;  Location: [facility];  Service: Cardiovascular;  Laterality: N/A;  . CORONARY STENT INTERVENTION N/A 10/15/2018   Procedure: CORONARY STENT INTERVENTION;  Surgeon: Burnell Blanks, MD;  Location: [facility];  Service: Cardiovascular;  Laterality: N/A;  . HEMORRHOID SURGERY    . HERNIA REPAIR    . Incision, drainage and debridement of perineum and  scrotum    . Irrigation and debridement of scrotal wound and closure of scrotal wound.    Marland Kitchen LEFT HEART  CATH AND CORS/GRAFTS ANGIOGRAPHY N/A 10/10/2016   Procedure: Left Heart Cath and Cors/Grafts Angiography;  Surgeon: Troy Sine, MD;  Location: [facility];  Service: Cardiovascular;  Laterality: N/A;  . LEFT HEART CATH AND CORS/GRAFTS ANGIOGRAPHY N/A 10/15/2018   Procedure: LEFT HEART CATH AND CORS/GRAFTS ANGIOGRAPHY;  Surgeon: Burnell Blanks, MD;  Location: [facility];  Service: Cardiovascular;  Laterality: N/A;  . MULTIPLE TOOTH EXTRACTIONS    . RADIOLOGY WITH ANESTHESIA N/A 05/09/2017   Procedure: MRI WITH ANESTHESIA   BRAIN WITHOUT CONTRAST;  Surgeon: Radiologist, Medication, MD;  Location: [facility];  Service: Radiology;  Laterality: N/A;  . Right radical nephrectomy.    . TOOTH EXTRACTION       Home Medications:  Prior to Admission medications   Medication Sig Start Date End Date Taking? Authorizing Provider  apixaban (ELIQUIS) 5 MG TABS tablet Take 5 mg by mouth 2 (two) times daily.   Yes [provider]  buPROPion (WELLBUTRIN XL) 300 MG 24 hr tablet Take 300 mg by mouth daily.   Yes [provider]  clopidogrel (PLAVIX) 75 MG tablet Take 1 tablet (75 mg total) by mouth daily with breakfast. 10/16/18  Yes Rai, Ripudeep K, MD  dronedarone (MULTAQ) 400 MG tablet Take 400 mg by mouth 2 (two) times daily with a meal.   Yes [provider]  ezetimibe (ZETIA) 10 MG tablet Take 5 mg by mouth at bedtime.   Yes [provider]  ferrous sulfate 325 (65 FE) MG tablet Take 325 mg by mouth every Monday, Wednesday, and Friday.    Yes [provider]  finasteride (PROSCAR) 5 MG tablet Take 5 mg by mouth at bedtime.    Yes [provider]  gabapentin (NEURONTIN) 300 MG capsule Take 300 mg by mouth 2 (two) times daily.    Yes [provider]  Insulin Detemir (LEVEMIR FLEXPEN) 100 UNIT/ML Pen Inject 50 Units into the skin 2 (two) times daily.    Yes [provider]  levothyroxine (SYNTHROID) 150 MCG tablet Take 150  mcg by mouth daily before breakfast.    Yes [provider]  lidocaine (XYLOCAINE) 5 % ointment Apply 1 application topically 2 (two) times daily as needed (Neuropathy). Apply to feet 10/26/19  Yes [provider]  magnesium oxide (MAG-OX) 400 MG tablet Take 400 mg by mouth in the morning, at noon, and at bedtime.    Yes [provider]  Melatonin 3 MG CAPS Take 1 capsule by mouth at bedtime.   Yes [provider]  metoprolol succinate (TOPROL-XL) 25 MG 24 hr tablet Take 12.5 mg by mouth daily.   Yes  [provider]  midodrine (PROAMATINE) 2.5 MG tablet Take 2.5 mg by mouth 3 (three) times daily. 10/22/19  Yes [provider]  nortriptyline (PAMELOR) 25 MG capsule Take 50 mg by mouth at bedtime.    Yes [provider]  Omega-3 Fatty Acids (FISH OIL) 1000 MG CAPS Take 2,000 mg by mouth 2 (two) times daily.    Yes [provider]  oxyCODONE-acetaminophen (PERCOCET) 10-325 MG per tablet Take 1 tablet by mouth every 6 (six) hours as needed for pain.    Yes [provider]  RABEprazole (ACIPHEX) 20 MG tablet Take 20 mg by mouth in the morning and at bedtime.   Yes [provider]  rosuvastatin (CRESTOR) 40 MG tablet Take 40 mg by mouth at bedtime.    Yes [provider]  senna (SENOKOT) 8.6 MG TABS tablet Take 1-2 tablets by mouth See admin instructions. Take 2 tablets by mouth in the morning and 1 tablet at bedtime   Yes [provider]  sertraline (ZOLOFT) 25 MG tablet Take 25 mg by mouth every morning.   Yes [provider]  tamsulosin (FLOMAX) 0.4 MG CAPS capsule Take 0.4 mg by mouth daily.   Yes [provider]  triamcinolone cream (KENALOG) 0.1 % Apply 1 application topically daily as needed (rash).   Yes [provider]  VITAMIN D PO Take 1,000 Units by mouth daily.   Yes [provider]  memantine (NAMENDA) 10 MG tablet Take 10 mg by mouth 2 (two) times  daily. 10/23/19   [provider]  traZODone (DESYREL) 100 MG tablet Take 100 mg by mouth at bedtime.    [provider]    Inpatient Medications: Scheduled Meds: . buPROPion  300 mg Oral Daily  . dronedarone  400 mg Oral BID WC  . ezetimibe  5 mg Oral QHS  . [START ON 10/30/2019] ferrous sulfate  325 mg Oral Q M,W,F  . finasteride  5 mg Oral QHS  . gabapentin  300 mg Oral BID  . insulin aspart  0-15 Units Subcutaneous TID WC  . insulin aspart  0-5 Units Subcutaneous QHS  . insulin detemir  25 Units Subcutaneous BID  . [START ON 10/30/2019] levothyroxine  150 mcg Oral QAC breakfast  . magnesium oxide  400 mg Oral BID  . melatonin  3 mg Oral QHS  . metoprolol succinate  12.5 mg Oral Daily  . midodrine  2.5 mg Oral TID  . rosuvastatin  40 mg Oral QHS  . senna  2 tablet Oral q AM   And  . senna  1 tablet Oral QHS  . tamsulosin  0.4 mg Oral Daily   Continuous Infusions: . sodium chloride     PRN Meds: acetaminophen, alum & mag hydroxide-simeth, lidocaine, oxyCODONE-acetaminophen **AND** oxyCODONE, triamcinolone cream  Allergies:    Allergies  Allergen Reactions  . Amiodarone Other (See Comments)    Caused lung and liver toxicity  . Tetanus Toxoids Hives    Social History:   Social History   Socioeconomic History  . Marital status: Married    Spouse name: Not on file  . Number of children: 4  . Years of education: 14.5  . Highest education level: Not on file  Occupational History  . Occupation: CAR SERVICE    Employer: SELF EMPLOYED  Tobacco Use  . Smoking status: Never Smoker  . Smokeless tobacco: Never Used  Substance and Sexual Activity  . Alcohol use: No  . Drug use: Not  Currently    Types: Cocaine    Comment: stopped cocaine maybe 10 years ago  . Sexual activity: Not on file  Other Topics Concern  . Not on file  Social History Narrative   Lives with wife and son.   Caffeine KZS:WFUX daily   Right handed    Social Determinants of  Health   Financial Resource Strain:   . Difficulty of Paying Living Expenses:   Food Insecurity:   . Worried About Charity fundraiser in the Last Year:   . Arboriculturist in the Last Year:   Transportation Needs:   . Film/video editor (Medical):   Marland Kitchen Lack of Transportation (Non-Medical):   Physical Activity:   . Days of Exercise per Week:   . Minutes of Exercise per Session:   Stress:   . Feeling of Stress :   Social Connections:   . Frequency of Communication with Friends and Family:   . Frequency of Social Gatherings with Friends and Family:   . Attends Religious Services:   . Active Member of Clubs or Organizations:   . Attends Archivist Meetings:   Marland Kitchen Marital Status:   Intimate Partner Violence:   . Fear of Current or Ex-Partner:   . Emotionally Abused:   Marland Kitchen Physically Abused:   . Sexually Abused:     Family History:    Family History  Problem Relation Age of Onset  . Heart disease Father   . Alzheimer's disease Mother   . Pneumonia Other   . Colon cancer Neg Hx   . Esophageal cancer Neg Hx   . Stomach cancer Neg Hx   . Rectal cancer Neg Hx      ROS:  Please see the history of present illness.   All other ROS reviewed and negative.     Physical Exam/Data:   Vitals:   11/07/2019 0600 10/14/2019 0615 10/30/2019 0700 10/18/2019 0715  BP:   125/85 (!) 139/95  Pulse: (!) 145 (!) 143 75 (!) 101  Resp: 14 (!) 23 17 19   Temp:      TempSrc:      SpO2: 97% 96% 94% 94%    Intake/Output Summary (Last 24 hours) at 10/16/2019 1253 Last data filed at 11/04/2019 1054 Gross per 24 hour  Intake --  Output 525 ml  Net -525 ml   Last 3 Weights 09/29/2019 08/23/2019 08/22/2019  Weight (lbs) 274 lb 8 oz 268 lb 6.4 oz 280 lb  Weight (kg) 124.512 kg 121.745 kg 127.007 kg     There is no height or weight on file to calculate BMI.  General:  Elderly male,  NAD  HEENT: normal Lymph: no adenopathy Neck: no JVD Endocrine:  No thryomegaly Vascular: No carotid  bruits; FA pulses 2+ bilaterally without bruits  Cardiac:  RR,  Tachy  Lungs:  clear to auscultation bilaterally, no wheezing, rhonchi or rales  Abd: soft, nontender, no hepatomegaly  Ext: no edema Musculoskeletal:  No deformities, BUE and BLE strength normal and equal Skin: warm and dry  Neuro:  CNs 2-12 intact, no focal abnormalities noted Psych:  Normal affect   EKG:  The EKG was personally reviewed and demonstrates:   Sinus tach at 115.  Telemetry:  Telemetry was personally reviewed and demonstrates:   Sinus tach   Relevant CV Studies:   Laboratory Data:  High Sensitivity Troponin:   Recent Labs  Lab 10/12/2019 0453 10/14/2019 0725  TROPONINIHS 61* 98*     Chemistry  Recent Labs  Lab 10/19/2019 0453  NA 136  K 4.5  CL 102  CO2 23  GLUCOSE 243*  BUN 22  CREATININE 2.13*  CALCIUM 8.5*  GFRNONAA 29*  GFRAA 34*  ANIONGAP 11    No results for input(s): PROT, ALBUMIN, AST, ALT, ALKPHOS, BILITOT in the last 168 hours. Hematology Recent Labs  Lab 11/04/2019 0453 11/07/2019 0725  WBC 9.0  --   RBC 3.03*  --   HGB 8.8* 8.9*  HCT 29.7* 30.1*  MCV 98.0  --   MCH 29.0  --   MCHC 29.6*  --   RDW 14.3  --   PLT 347  --    BNPNo results for input(s): BNP, PROBNP in the last 168 hours.  DDimer No results for input(s): DDIMER in the last 168 hours.   Radiology/Studies:  DG Chest 2 View  Result Date: 10/10/2019 CLINICAL DATA:  Chest pain. EXAM: CHEST - 2 VIEW COMPARISON:  08/22/2019 FINDINGS: The cardiac silhouette, mediastinal and hilar contours are within normal limits and stable. Stable surgical changes related to bypass surgery. Coronary artery stents are noted. Stable mild tortuosity and calcification of the thoracic aorta. No acute pulmonary findings. No pleural effusions. The bony thorax is intact. IMPRESSION: No acute cardiopulmonary findings. Electronically Signed   By: Marijo Sanes M.D.   On: 11/04/2019 05:33    HEART Score (for undifferentiated chest pain):  HEAR  Score: 4    Assessment and Plan:   1. Chest discomfort: The patient had an episode of chest pain.  It did not feel like his previous episodes of angina and also was not relieved with 3 sublingual nitroglycerin.  His troponin levels were mildly elevated but this is likely more consistent with demand ischemia in the setting of his chronic kidney disease and not necessarily of an acute coronary syndrome.   He has known coronary stenting and also has had coronary artery bypass grafting. He is severely limited by arthritis and does not get any regular exercise.  We will continue to draw troponin levels. His hemoglobin has dropped to 8.1.  This is roughly half of where he was 2 months ago.  I suspect that this anemia may be contributing to his ischemia.  We will need to hold his Plavix and Eliquis for now.  Will need for the GI team to evaluate him and determine the source of his bleeding and correct it if at all possible.  We will need this done before we can safely do a heart catheterization with possible PCI.   He is very stable and is at low risk for upper endoscopy and colonoscopy.  He is not having any angina currently.  2.  GI bleeding: His hemoglobin has dropped 16.0 down to 8.8 in a matter of 2 months.  He was Hemoccult positive.  Further work-up per the GI team.  3.  Diabetes mellitus: Glucose levels are elevated.  Further management per the primary medical team.        For questions or updates, please contact [facility] Please consult [email or web address] for contact info under     Signed, Mertie Moores, MD  11/04/2019 12:53 PM

## 2019-10-29 NOTE — Progress Notes (Addendum)
Notified by Celene Squibb regarding repeat troponin 61->98-> 2635.  Notified cardiology of the acute change to see if there be any change in recommendations as initially thought symptoms likely secondary to demand ischemia anemia.

## 2019-10-29 NOTE — ED Notes (Signed)
Attempt to call report x 1  

## 2019-10-29 NOTE — ED Notes (Signed)
Please call spouse Adonus Uselman @ [phone] for a status update--Carita Sollars

## 2019-10-29 NOTE — H&P (Addendum)
History and Physical    Matthew [name]:[id number] DOB: [birth year] DOA: 10/14/2019  Referring MD/NP/PA: Shela Leff, MD PCP: Marda Stalker, PA-C  Patient coming from: Home chest pain  Chief Complaint: Chest pain  I have personally briefly reviewed patient's old medical records in [place]   HPI: Matthew [name] is a 74 y.o. male with medical history significant of HTN, HLD, PAF on Eliquis, CAD s/p CABGx5v and stents, AAA, hypothyroidism, CKD stage III, chronic pain, and GERD who presented with complaints of chest pain.  Patient had been watching the basketball game last night and never really got to sleep with he had acute onset of left-sided chest pain.  He described it as dull pain with radiation down his arm.  With the chest pain he denied having any diaphoresis, nausea, vomiting, or shortness of breath.  Reported having similar symptoms 3 days ago with diaphoresis and nausea, but resolved with nitroglycerin.  Last night when he attempted to take a nitroglycerin he had no relief in symptoms.  Denies any recent change in weight, leg swelling, abdominal pain in, or diarrhea.  Other associated symptoms include chronic shortness of breath unchanged, chronic constipation for which he is on stool softeners, and reported seeing some bright red blood in his stools approximately 3 days ago.  He is not on any NSAIDs, but is on Eliquis and Plavix.  Patient had a left heart cath by Dr. Angelena Form in 10/2018 which showed severe triple-vessel coronary artery disease status post 5 vessel CABG with 4/5 patent grafts with successful DES to mid body of SVG to OM2/OM3.  Patient had prior colonoscopy in 2015 noted 5 sessile polyps which were removed.  Endoscopy from 2016 and multiple gastric polyps and Barrett's esophagus.  Patient was given 324 mg of aspirin in route with EMS  ED Course: Upon admission into the emergency department patient was seen to be afebrile with heart rates elevated  up to 154, and all other vital signs maintained.  Labs significant for hemoglobin 8.8, BUN 22, creatinine 2.13, glucose 243, and high-sensitivity troponin 61.  Stool guaiacs were noted to be positive, but not reported to have gross blood on rectal exam.  Chest x-ray showed no acute abnormalities.  Patient was given Tylenol and fentanyl.  TRH called to admit.  Review of Systems  Constitutional: Positive for malaise/fatigue. Negative for fever.  HENT: Negative for congestion and nosebleeds.   Eyes: Negative for photophobia and pain.  Respiratory: Positive for shortness of breath (chronic). Negative for cough.   Cardiovascular: Positive for chest pain. Negative for leg swelling.  Gastrointestinal: Positive for blood in stool. Negative for abdominal pain, nausea and vomiting.  Genitourinary: Negative for dysuria and hematuria.  Musculoskeletal: Negative for falls and myalgias.  Skin: Negative for rash.  Neurological: Negative for focal weakness and loss of consciousness.  Endo/Heme/Allergies: Negative for polydipsia.  Psychiatric/Behavioral: Positive for memory loss. Negative for substance abuse.    Past Medical History:  Diagnosis Date  . AAA (abdominal aortic aneurysm) ([place])    a. Aortic US 05/2012: 3.9 cm fusiform abdominal aortic aneurysm. F/u recommended 05/2014.  Marland Kitchen Anemia   . Arthritis    DDD  . Atrial fibrillation ([place])    a. Post-op CABG 05/2009. b. Re-identified 12/2013.   Marland Kitchen CAD (coronary artery disease)    a. s/p CABGx4 05/2009. 10/10/16 PCI with DES x1 SVG-->OM2/OM3  . Chronic constipation   . CKD (chronic kidney disease), stage III   . Cocaine abuse in remission ([place])   .  Depression   . Diabetes mellitus   . Empyema lung ([place])    a. PNA 02/2011 c/b empyema requiring chest tube, drainage of pleural effusion and decortication.  . Frequent falls   . GERD (gastroesophageal reflux disease)   . Hepatic steatosis   . Hyperlipidemia   . Hypertension   . Hypothyroidism   . Iron  deficiency anemia   . LAFB (left anterior fascicular block)   . Lung cancer ([place])    a. Tx with interleukin in 2002 per patient.  . Nodule of left lung    a. Cavitary nodules 02/2011, f/u recommended.  . Obesity   . Post concussion syndrome   . RBBB   . Renal cell carcinoma    a. s/p R nephrectomy 2002. Reported pulmonary mets per note at that time.  . Scrotal abscess    a. 2012 2/2 Streptococcus group C, status post incision and drainage.  . Sleep apnea    wears CPAP  . 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  . Vitamin D deficiency   . Wears dentures     Past Surgical History:  Procedure Laterality Date  . Bronchoscopy, right video-assisted thoracoscopy, minithoracotomy, drainage of pleural effusion, and decortication.  02/26/2011   [facility]  . CABG x 4  05/13/2009   [facility]  . CARDIAC SURGERY    . COLONOSCOPY W/ BIOPSIES AND POLYPECTOMY    . CORONARY STENT INTERVENTION N/A 10/10/2016   Procedure: Coronary Stent Intervention;  Surgeon: Troy Sine, MD;  Location: [facility];  Service: Cardiovascular;  Laterality: N/A;  . CORONARY STENT INTERVENTION N/A 10/15/2018   Procedure: CORONARY STENT INTERVENTION;  Surgeon: Burnell Blanks, MD;  Location: [facility];  Service: Cardiovascular;  Laterality: N/A;  . HEMORRHOID SURGERY    . HERNIA REPAIR    . Incision, drainage and debridement of perineum and  scrotum    . Irrigation and debridement of scrotal wound and closure of scrotal wound.    Marland Kitchen LEFT HEART CATH AND CORS/GRAFTS ANGIOGRAPHY N/A 10/10/2016   Procedure: Left Heart Cath and Cors/Grafts Angiography;  Surgeon: Troy Sine, MD;  Location: [facility];  Service: Cardiovascular;  Laterality: N/A;  . LEFT HEART CATH AND CORS/GRAFTS ANGIOGRAPHY N/A 10/15/2018   Procedure: LEFT HEART CATH AND CORS/GRAFTS ANGIOGRAPHY;  Surgeon: Burnell Blanks, MD;  Location: [facility];  Service: Cardiovascular;  Laterality:  N/A;  . MULTIPLE TOOTH EXTRACTIONS    . RADIOLOGY WITH ANESTHESIA N/A 05/09/2017   Procedure: MRI WITH ANESTHESIA   BRAIN WITHOUT CONTRAST;  Surgeon: Radiologist, Medication, MD;  Location: [facility];  Service: Radiology;  Laterality: N/A;  . Right radical nephrectomy.    . TOOTH EXTRACTION       reports that he has never smoked. He has never used smokeless tobacco. He reports previous drug use. Drug: Cocaine. He reports that he does not drink alcohol.  Allergies  Allergen Reactions  . Amiodarone Other (See Comments)    Caused lung and liver toxicity  . Tetanus Toxoids Hives    Family History  Problem Relation Age of Onset  . Heart disease Father   . Alzheimer's disease Mother   . Pneumonia Other   . Colon cancer Neg Hx   . Esophageal cancer Neg Hx   . Stomach cancer Neg Hx   . Rectal cancer Neg Hx     Prior to Admission medications   Medication Sig Start Date End  Date Taking? Authorizing Provider  apixaban (ELIQUIS) 5 MG TABS tablet Take 5 mg by mouth 2 (two) times daily.    [provider]  buPROPion (WELLBUTRIN XL) 300 MG 24 hr tablet Take 300 mg by mouth daily.    [provider]  clopidogrel (PLAVIX) 75 MG tablet Take 1 tablet (75 mg total) by mouth daily with breakfast. 10/16/18   Rai, Ripudeep K, MD  dronedarone (MULTAQ) 400 MG tablet Take 400 mg by mouth 2 (two) times daily with a meal.    [provider]  ezetimibe (ZETIA) 10 MG tablet Take 5 mg by mouth at bedtime.    [provider]  ferrous sulfate 325 (65 FE) MG tablet Take 325 mg by mouth every Monday, Wednesday, and Friday.     [provider]  finasteride (PROSCAR) 5 MG tablet Take 5 mg by mouth at bedtime.     [provider]  gabapentin (NEURONTIN) 300 MG capsule Take 300 mg by mouth 2 (two) times daily.     [provider]  Insulin Detemir (LEVEMIR FLEXPEN) 100 UNIT/ML Pen Inject 50 Units into the skin 2 (two) times daily.     [provider]   levothyroxine (SYNTHROID) 150 MCG tablet Take 150 mcg by mouth daily before breakfast.     [provider]  magnesium oxide (MAG-OX) 400 MG tablet Take 400 mg by mouth in the morning, at noon, and at bedtime.     [provider]  Melatonin 3 MG CAPS Take 1 capsule by mouth at bedtime.    [provider]  metoprolol succinate (TOPROL-XL) 25 MG 24 hr tablet Take 12.5 mg by mouth daily.    [provider]  nortriptyline (PAMELOR) 25 MG capsule Take 50 mg by mouth at bedtime.     [provider]  Omega-3 Fatty Acids (FISH OIL) 1000 MG CAPS Take 2,000 mg by mouth 2 (two) times daily.     [provider]  oxyCODONE-acetaminophen (PERCOCET) 10-325 MG per tablet Take 1 tablet by mouth every 6 (six) hours as needed for pain.     [provider]  RABEprazole (ACIPHEX) 20 MG tablet Take 20 mg by mouth in the morning and at bedtime.    [provider]  rosuvastatin (CRESTOR) 40 MG tablet Take 40 mg by mouth at bedtime.     [provider]  senna (SENOKOT) 8.6 MG TABS tablet Take 1-2 tablets by mouth See admin instructions. Take 2 tablets by mouth in the morning and 1 tablet at bedtime    [provider]  sertraline (ZOLOFT) 25 MG tablet Take 25 mg by mouth every morning.    [provider]  tamsulosin (FLOMAX) 0.4 MG CAPS capsule Take 0.4 mg by mouth daily.    [provider]  traZODone (DESYREL) 100 MG tablet Take 100 mg by mouth at bedtime.    [provider]  triamcinolone cream (KENALOG) 0.1 % Apply 1 application topically daily as needed (rash).    [provider]  VITAMIN D PO Take 1,000 Units by mouth daily.    [provider]    Physical Exam:  Constitutional: Elderly male who appears to be in no acute distress Vitals:   10/15/2019 0440 11/04/2019 0445 10/13/2019 0530 10/25/2019 0700  BP:  100/83 113/66 125/85  Pulse:  (!) 122 (!) 154 75  Resp:  16 15 17   Temp:  98.5  F (36.9 C)    TempSrc:  Oral  SpO2: 100% 94% 100% 94%   Eyes: PERRL, lids and conjunctivae normal ENMT: Mucous membranes are dry. Posterior pharynx clear of any exudate or lesions.  Neck: normal, supple, no masses, no thyromegaly Respiratory: clear to auscultation bilaterally, no wheezing, no crackles. Normal respiratory effort. No accessory muscle use.   Cardiovascular: Tachycardic, no murmurs / rubs / gallops. No extremity edema. 2+ pedal pulses. No carotid bruits.  Abdomen: no tenderness, no masses palpated. No hepatosplenomegaly. Bowel sounds positive.  Musculoskeletal: no clubbing / cyanosis. No joint deformity upper and lower extremities. Good ROM, no contractures. Normal muscle tone.  Skin: no rashes, lesions, ulcers. No induration Neurologic: CN 2-12 grossly intact. Sensation intact, DTR normal. Strength 5/5 in all 4.  Psychiatric: Normal judgment and insight. Alert and oriented x 3. Normal mood.     Labs on Admission: I have personally reviewed following labs and imaging studies  CBC: Recent Labs  Lab 10/22/2019 0453  WBC 9.0  HGB 8.8*  HCT 29.7*  MCV 98.0  PLT 992   Basic Metabolic Panel: Recent Labs  Lab 10/26/2019 0453  NA 136  K 4.5  CL 102  CO2 23  GLUCOSE 243*  BUN 22  CREATININE 2.13*  CALCIUM 8.5*   GFR: CrCl cannot be calculated (Unknown ideal weight.). Liver Function Tests: No results for input(s): AST, ALT, ALKPHOS, BILITOT, PROT, ALBUMIN in the last 168 hours. No results for input(s): LIPASE, AMYLASE in the last 168 hours. No results for input(s): AMMONIA in the last 168 hours. Coagulation Profile: No results for input(s): INR, PROTIME in the last 168 hours. Cardiac Enzymes: No results for input(s): CKTOTAL, CKMB, CKMBINDEX, TROPONINI in the last 168 hours. BNP (last 3 results) No results for input(s): PROBNP in the last 8760 hours. HbA1C: No results for input(s): HGBA1C in the last 72 hours. CBG: No results for input(s): GLUCAP in the  last 168 hours. Lipid Profile: No results for input(s): CHOL, HDL, LDLCALC, TRIG, CHOLHDL, LDLDIRECT in the last 72 hours. Thyroid Function Tests: No results for input(s): TSH, T4TOTAL, FREET4, T3FREE, THYROIDAB in the last 72 hours. Anemia Panel: No results for input(s): VITAMINB12, FOLATE, FERRITIN, TIBC, IRON, RETICCTPCT in the last 72 hours. Urine analysis:    Component Value Date/Time   COLORURINE YELLOW 02/27/2017 1242   APPEARANCEUR CLEAR 02/27/2017 1242   LABSPEC 1.007 02/27/2017 1242   PHURINE 5.0 02/27/2017 1242   GLUCOSEU >=500 (A) 02/27/2017 1242   HGBUR MODERATE (A) 02/27/2017 1242   BILIRUBINUR NEGATIVE 02/27/2017 1242   KETONESUR NEGATIVE 02/27/2017 1242   PROTEINUR NEGATIVE 02/27/2017 1242   UROBILINOGEN 1.0 02/16/2011 0846   NITRITE NEGATIVE 02/27/2017 1242   LEUKOCYTESUR NEGATIVE 02/27/2017 1242   Sepsis Labs: Recent Results (from the past 240 hour(s))  SARS Coronavirus 2 by RT PCR (hospital order, performed in [facility] hospital lab) Nasopharyngeal Nasopharyngeal Swab     Status: None   Collection Time: 10/18/2019  5:38 AM   Specimen: Nasopharyngeal Swab  Result Value Ref Range Status   SARS Coronavirus 2 NEGATIVE NEGATIVE Final    Comment: (NOTE) SARS-CoV-2 target nucleic acids are NOT DETECTED. The SARS-CoV-2 RNA is generally detectable in upper and lower respiratory specimens during the acute phase of infection. The lowest concentration of SARS-CoV-2 viral copies this assay can detect is 250 copies / mL. A negative result does not preclude SARS-CoV-2 infection and should not be used as the sole basis for treatment or other patient management decisions.  A negative result may occur with improper specimen collection /  handling, submission of specimen other than nasopharyngeal swab, presence of viral mutation(s) within the areas targeted by this assay, and inadequate number of viral copies (<250 copies / mL). A negative result must be combined with  clinical observations, patient history, and epidemiological information. Fact Sheet for Patients:   StrictlyIdeas.no Fact Sheet for Healthcare Providers: BankingDealers.co.za This test is not yet approved or cleared  by the [place] FDA and has been authorized for detection and/or diagnosis of SARS-CoV-2 by FDA under an Emergency Use Authorization (EUA).  This EUA will remain in effect (meaning this test can be used) for the duration of the COVID-19 declaration under Section 564(b)(1) of the Act, 21 U.S.C. section 360bbb-3(b)(1), unless the authorization is terminated or revoked sooner. Performed at [facility], [place] [address] [place], [place] [address]      Radiological Exams on Admission: DG Chest 2 View  Result Date: 11/07/2019 CLINICAL DATA:  Chest pain. EXAM: CHEST - 2 VIEW COMPARISON:  08/22/2019 FINDINGS: The cardiac silhouette, mediastinal and hilar contours are within normal limits and stable. Stable surgical changes related to bypass surgery. Coronary artery stents are noted. Stable mild tortuosity and calcification of the thoracic aorta. No acute pulmonary findings. No pleural effusions. The bony thorax is intact. IMPRESSION: No acute cardiopulmonary findings. Electronically Signed   By: Marijo Sanes M.D.   On: 10/15/2019 05:33    EKG: Independently reviewed.  Sinus tachycardia 126 bpm with RBBB and QTc 532  Assessment/Plan  Chest pain Elevated troponin  CAD: Acute on chronic.  Patient presented with complaints of chest pain which woke him out of sleep.  Not relieved with nitroglycerin.  Troponin 61->98. Chest x-ray was noted to be clear.  Patient's last heart catheter from 10/2018 with severe triple vessel coronary artery disease 4/5 patent grafts and had drug-eluting stent placed at the mid body of SVG to OM2/OM3.  Suspect secondary to demand in the setting of acute blood loss. -Admit to a progressive bed -Trend  cardiac troponins -Hold Plavix due to suspected bleed -Check echocardiogram -Appreciate cardiology consultative services, we will follow-up for the recommendation.  GI bleed Acute blood loss anemia: Acute.  Patient's baseline hemoglobin previously noted to be 08/22/19, but patient presents with hemoglobin down to 8.8.  Stool guaiacs were noted to be positive.  Review of records note previous history of gastric as well as sessile polyps on previous EGD and colonoscopy from 2016 and 2015 respectively.  Patient on anticoagulation Eliquis and plavix. -Type and screen for possible need blood products -Hold Eliquis -Clear liquid diet as tolerated -Monitor H&H every 6 hours -IV fluids at 50 mL/h -Transfuse blood products as needed for hemoglobin less than 8 -Appreciate GI consultative services, will follow-up further recommendations  Paroxysmal atrial fibrillation on chronic anticoagulation: Patient sinus tachycardia with heart rates into the 120s.  CHA2DS2-VASc score = >2. Home medications include Eliquis, dronedarone 400 mg twice daily, and metoprolol 12.5 mg daily -Hold Eliquis due to acute blood loss anemia suspected secondary to GI bleed -Continue metoprolol and dronedarone  Diabetes mellitus type 2 with polyneuropathy: Chronic.  Patient presents with glucose elevated to 243.  Last hemoglobin A1c noted to be 7.6 on 08/22/2019.  Home medications include Levemir 50 units twice daily. -Hypoglycemic protocol -Continue gabapentin -CBGs before every meal with moderate SSI -Reduce home Levemir dose to 25 units twice daily as clear liquid diet for now -Adjust regimen as needed  Prolonged QT interval: QTC elevated up to 532 on admission. -Correct any electrolyte  abnormalities -Continue to monitor QT interval  AAA: 3 x 3.2 cm aneurysm of the distal abdominal aorta on CT in 10/2018.   -Likely needs follow-up surveillance sometime this month.  Hypothyroidism   -Check TSH -Continue  levothyroxine  Depression: Home medications include Zoloft 25 mg every morning, Wellbutrin 300 mg daily, nortriptyline 25 mg nightly, and trazodone 100 mg nightly which is currently on hold. -Continue Wellbutrin -Held Zoloft and nortriptyline due to prolonged QT interval  Chronic kidney disease stage IV/IIIb: Creatinine 2.13 which appears near patient's baseline. -Continue to monitor  Mild memory loss: Patient just recently started on Namenda. -Held Namenda due to prolonged QT interval  Hyperlipidemia -Continue Zetia, fenofibrate, and Crestor  GERD -Hold PPI due to prolonged QT interval  OSA on CPAP -Continue CPAP per RT  DVT prophylaxis: SCDs Code Status: Full  Family Communication: Wife updated over the phone  Disposition Plan: Possible discharge home in 2 to 3 days Consults called: Cardiology and GI Admission status: Inpatient  Norval Morton MD [facility] Pager [phone]   If 7PM-7AM, please contact night-coverage [email or web address] Password [facility]  10/21/2019, 7:13 AM

## 2019-10-29 NOTE — Progress Notes (Signed)
Patient with increased troponins - cardiology and attending made aware. No new orders received. Will continue to monitor closely. Primary RN, Colletta Maryland made aware.

## 2019-10-29 NOTE — ED Triage Notes (Signed)
Pt comes via [facility] for CP that woke him from him from his sleep, radiation to L arm, and SOB, 10/10 pain, given one nitro PTA and 324 ASA, pain dropped to a 8/10. Hx of MI, afib. Pt in sinus arrhythmia with RBBB

## 2019-10-29 NOTE — ED Provider Notes (Signed)
[facility] EMERGENCY DEPARTMENT Provider Note   CSN: [id number] Arrival date & time: 11/04/2019  2542     History Chief Complaint  Patient presents with  . Chest Pain    Matthew [name] is a 74 y.o. male.  The history is provided by the patient and medical records.  Chest Pain   74 year old male with history of AAA, anemia, arthritis, A. fib on Eliquis, coronary artery disease status post CABG x5 and multiple stents, GERD, hyperlipidemia, diabetes, depression, former history of cocaine abuse, chronic kidney disease stage III, presenting to the ED with chest pain.  Patient reports he was never able to go to sleep last night as pain prevented him from doing so.  Pain left-sided with radiation into the left arm, about midway down the arm.  He denies any associated focal numbness or weakness.  He has chronic difficulty breathing and shortness of breath, this is unchanged from his baseline.  No diaphoresis, nausea, or vomiting.  He is not had any syncopal events.  He states current pain is somewhat different than his prior MIs, he does report just about every May he ends up in the ER with a new heart attack.  He did have a cardiac cath on 10/15/2018 which did reveal several chronic occlusions, new stenting to posterior circumflex.  He is followed by cardiology, Dr. Irish Lack.  Past Medical History:  Diagnosis Date  . AAA (abdominal aortic aneurysm) ([place])    a. Aortic US 05/2012: 3.9 cm fusiform abdominal aortic aneurysm. F/u recommended 05/2014.  Marland Kitchen Anemia   . Arthritis    DDD  . Atrial fibrillation ([place])    a. Post-op CABG 05/2009. b. Re-identified 12/2013.   Marland Kitchen CAD (coronary artery disease)    a. s/p CABGx4 05/2009. 10/10/16 PCI with DES x1 SVG-->OM2/OM3  . Chronic constipation   . CKD (chronic kidney disease), stage III   . Cocaine abuse in remission ([place])   . Depression   . Diabetes mellitus   . Empyema lung ([place])    a. PNA 02/2011 c/b empyema requiring chest tube,  drainage of pleural effusion and decortication.  . Frequent falls   . GERD (gastroesophageal reflux disease)   . Hepatic steatosis   . Hyperlipidemia   . Hypertension   . Hypothyroidism   . Iron deficiency anemia   . LAFB (left anterior fascicular block)   . Lung cancer ([place])    a. Tx with interleukin in 2002 per patient.  . Nodule of left lung    a. Cavitary nodules 02/2011, f/u recommended.  . Obesity   . Post concussion syndrome   . RBBB   . Renal cell carcinoma    a. s/p R nephrectomy 2002. Reported pulmonary mets per note at that time.  . Scrotal abscess    a. 2012 2/2 Streptococcus group C, status post incision and drainage.  . Sleep apnea    wears CPAP  . 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  . Vitamin D deficiency   . Wears dentures     Patient Active Problem List   Diagnosis Date Noted  . Episodic lightheadedness 09/29/2019  . Other headache syndrome 09/29/2019  . Atrial fibrillation ([place]) 08/22/2019  . Ischemic chest pain ([place]) 10/14/2018  . OSA (obstructive sleep apnea) 08/22/2018  . Type 2 diabetes mellitus with vascular disease ([place]) 07/29/2017  . Post concussion syndrome 03/13/2017  . Post-concussion headache 03/13/2017  . Neck pain 03/13/2017  . Insomnia  03/13/2017  . Diabetic polyneuropathy associated with type 2 diabetes mellitus ([place]) 03/13/2017  . Unstable angina ([place])   . Non-ST elevation (NSTEMI) myocardial infarction ([place]) 10/08/2016  . Atrial fibrillation with RVR ([place]) 01/26/2016  . Atrial fibrillation with rapid ventricular response ([place]) 01/26/2016  . PAF (paroxysmal atrial fibrillation) ([place]) 12/21/2013  . Chronic abdominal pain 12/21/2013  . CKD (chronic kidney disease), stage III 12/21/2013  . H/O: lung cancer 12/21/2013  . LV dysfunction 12/21/2013  . RBBB   . LAFB (left anterior fascicular block)   . AAA (abdominal aortic aneurysm) ([place])   . Chest pain 12/20/2013  . Mixed hyperlipidemia 10/14/2013  .  Obesity, unspecified 10/14/2013  . Right Empyema    .  Left upper lobe lesion or nodule   . Hypertension   . CAD (coronary artery disease)   . Renal cell carcinoma ([place])   . [place]   . Paroxysmal supraventricular tachycardia ([place])   . Hypothyroidism     Past Surgical History:  Procedure Laterality Date  . Bronchoscopy, right video-assisted thoracoscopy, minithoracotomy, drainage of pleural effusion, and decortication.  02/26/2011   [facility]  . CABG x 4  05/13/2009   [facility]  . CARDIAC SURGERY    . COLONOSCOPY W/ BIOPSIES AND POLYPECTOMY    . CORONARY STENT INTERVENTION N/A 10/10/2016   Procedure: Coronary Stent Intervention;  Surgeon: Troy Sine, MD;  Location: [facility];  Service: Cardiovascular;  Laterality: N/A;  . CORONARY STENT INTERVENTION N/A 10/15/2018   Procedure: CORONARY STENT INTERVENTION;  Surgeon: Burnell Blanks, MD;  Location: [facility];  Service: Cardiovascular;  Laterality: N/A;  . HEMORRHOID SURGERY    . HERNIA REPAIR    . Incision, drainage and debridement of perineum and  scrotum    . Irrigation and debridement of scrotal wound and closure of scrotal wound.    Marland Kitchen LEFT HEART CATH AND CORS/GRAFTS ANGIOGRAPHY N/A 10/10/2016   Procedure: Left Heart Cath and Cors/Grafts Angiography;  Surgeon: Troy Sine, MD;  Location: [facility];  Service: Cardiovascular;  Laterality: N/A;  . LEFT HEART CATH AND CORS/GRAFTS ANGIOGRAPHY N/A 10/15/2018   Procedure: LEFT HEART CATH AND CORS/GRAFTS ANGIOGRAPHY;  Surgeon: Burnell Blanks, MD;  Location: [facility];  Service: Cardiovascular;  Laterality: N/A;  . MULTIPLE TOOTH EXTRACTIONS    . RADIOLOGY WITH ANESTHESIA N/A 05/09/2017   Procedure: MRI WITH ANESTHESIA   BRAIN WITHOUT CONTRAST;  Surgeon: Radiologist, Medication, MD;  Location: [facility];  Service: Radiology;  Laterality: N/A;  . Right radical nephrectomy.    . TOOTH EXTRACTION         Family History  Problem Relation Age  of Onset  . Heart disease Father   . Alzheimer's disease Mother   . Pneumonia Other   . Colon cancer Neg Hx   . Esophageal cancer Neg Hx   . Stomach cancer Neg Hx   . Rectal cancer Neg Hx     Social History   Tobacco Use  . Smoking status: Never Smoker  . Smokeless tobacco: Never Used  Substance Use Topics  . Alcohol use: No  . Drug use: Not Currently    Types: Cocaine    Comment: stopped cocaine maybe 10 years ago    Home Medications Prior to Admission medications   Medication Sig Start Date End Date Taking? Authorizing Provider  apixaban (ELIQUIS) 5 MG TABS tablet Take 5 mg by mouth 2 (two) times daily.    [provider]  buPROPion (WELLBUTRIN XL) 300 MG 24 hr tablet Take 300 mg by mouth daily.    [provider]  clopidogrel (PLAVIX) 75 MG tablet Take 1 tablet (75 mg total) by mouth daily with breakfast. 10/16/18   Rai, Ripudeep K, MD  dronedarone (MULTAQ) 400 MG tablet Take 400 mg by mouth 2 (two) times daily with a meal.    [provider]  ezetimibe (ZETIA) 10 MG tablet Take 5 mg by mouth at bedtime.    [provider]  ferrous sulfate 325 (65 FE) MG tablet Take 325 mg by mouth every Monday, Wednesday, and Friday.     [provider]  finasteride (PROSCAR) 5 MG tablet Take 5 mg by mouth at bedtime.     [provider]  gabapentin (NEURONTIN) 300 MG capsule Take 300 mg by mouth 2 (two) times daily.     [provider]  Insulin Detemir (LEVEMIR FLEXPEN) 100 UNIT/ML Pen Inject 50 Units into the skin 2 (two) times daily.     [provider]  levothyroxine (SYNTHROID) 150 MCG tablet Take 150 mcg by mouth daily before breakfast.     [provider]  magnesium oxide (MAG-OX) 400 MG tablet Take 400 mg by mouth in the morning, at noon, and at bedtime.     [provider]  Melatonin 3 MG CAPS Take 1 capsule by mouth at bedtime.    [provider]  metoprolol succinate (TOPROL-XL) 25 MG  24 hr tablet Take 12.5 mg by mouth daily.    [provider]  nortriptyline (PAMELOR) 25 MG capsule Take 50 mg by mouth at bedtime.     [provider]  Omega-3 Fatty Acids (FISH OIL) 1000 MG CAPS Take 2,000 mg by mouth 2 (two) times daily.     [provider]  oxyCODONE-acetaminophen (PERCOCET) 10-325 MG per tablet Take 1 tablet by mouth every 6 (six) hours as needed for pain.     [provider]  RABEprazole (ACIPHEX) 20 MG tablet Take 20 mg by mouth in the morning and at bedtime.    [provider]  rosuvastatin (CRESTOR) 40 MG tablet Take 40 mg by mouth at bedtime.     [provider]  senna (SENOKOT) 8.6 MG TABS tablet Take 1-2 tablets by mouth See admin instructions. Take 2 tablets by mouth in the morning and 1 tablet at bedtime    [provider]  sertraline (ZOLOFT) 25 MG tablet Take 25 mg by mouth every morning.    [provider]  tamsulosin (FLOMAX) 0.4 MG CAPS capsule Take 0.4 mg by mouth daily.    [provider]  traZODone (DESYREL) 100 MG tablet Take 100 mg by mouth at bedtime.    [provider]  triamcinolone cream (KENALOG) 0.1 % Apply 1 application topically daily as needed (rash).    [provider]  VITAMIN D PO Take 1,000 Units by mouth daily.    [provider]    Allergies    Amiodarone and Tetanus toxoids  Review of Systems   Review of Systems  Cardiovascular: Positive for chest pain.  All other systems reviewed and are negative.   Physical Exam Updated Vital Signs BP 100/83   Pulse (!) 122   Temp 98.5 F (36.9 C) (Oral)   Resp 18   SpO2 94%   Physical Exam Vitals and nursing note reviewed.  Constitutional:      Appearance: He is well-developed.     Comments: Elderly, appears  pale  HENT:     Head: Normocephalic and atraumatic.  Eyes:     Conjunctiva/sclera: Conjunctivae normal.     Pupils: Pupils are equal, round, and reactive to light.    Cardiovascular:     Rate and Rhythm: Normal rate and regular rhythm.     Heart sounds: Normal heart sounds.     Comments: Varying between AFIB and NSR during exam, HR not exceeding 120's Pulmonary:     Effort: Pulmonary effort is normal.     Breath sounds: Normal breath sounds. No decreased breath sounds or wheezing.  Abdominal:     General: Bowel sounds are normal.     Palpations: Abdomen is soft.  Genitourinary:    Comments: Exam chaperoned by RN Loose brown stool noted on DRE, occult positive Musculoskeletal:        General: Normal range of motion.     Cervical back: Normal range of motion.  Skin:    General: Skin is warm and dry.  Neurological:     Mental Status: He is alert and oriented to person, place, and time.     ED Results / Procedures / Treatments   Labs (all labs ordered are listed, but only abnormal results are displayed) Labs Reviewed  BASIC METABOLIC PANEL - Abnormal; Notable for the following components:      Result Value   Glucose, Bld 243 (*)    Creatinine, Ser 2.13 (*)    Calcium 8.5 (*)    GFR calc non Af Amer 29 (*)    GFR calc Af Amer 34 (*)    All other components within normal limits  CBC - Abnormal; Notable for the following components:   RBC 3.03 (*)    Hemoglobin 8.8 (*)    HCT 29.7 (*)    MCHC 29.6 (*)    All other components within normal limits  POC OCCULT BLOOD, ED - Abnormal; Notable for the following components:   Fecal Occult Bld POSITIVE (*)    All other components within normal limits  TROPONIN I (HIGH SENSITIVITY) - Abnormal; Notable for the following components:   Troponin I (High Sensitivity) 61 (*)    All other components within normal limits  SARS CORONAVIRUS 2 BY RT PCR [facility] ORDER, [facility])    EKG EKG Interpretation  Date/Time:  Thursday Oct 29 2019 04:45:59 EDT Ventricular Rate:  115 PR Interval:    QRS Duration: 142 QT Interval:  378 QTC Calculation: 523 R Axis:   -83 Text  Interpretation: Sinus tachycardia Premature atrial complexes Right bundle branch block Inferior infarct, old Anterior infarct, old When compared with ECG of EARLIER SAME DATE No significant change was found Confirmed by Delora Fuel ([id number]) on 10/10/2019 5:12:50 AM   Radiology DG Chest 2 View  Result Date: 10/26/2019 CLINICAL DATA:  Chest pain. EXAM: CHEST - 2 VIEW COMPARISON:  08/22/2019 FINDINGS: The cardiac silhouette, mediastinal and hilar contours are within normal limits and stable. Stable surgical changes related to bypass surgery. Coronary artery stents are noted. Stable mild tortuosity and calcification of the thoracic aorta. No acute pulmonary findings. No pleural effusions. The bony thorax is intact. IMPRESSION: No acute cardiopulmonary findings. Electronically Signed   By: Marijo Sanes M.D.   On: 10/28/2019 05:33    Procedures Procedures (including critical care time)  CRITICAL CARE Performed by: Larene Pickett   Total critical care time: 45 minutes  Critical care time was exclusive of separately billable procedures and treating other patients.  Critical care was necessary to treat or prevent imminent or life-threatening deterioration.  Critical care was time spent personally by me on the following activities: development of treatment plan with patient and/or surrogate as well as nursing, discussions with consultants, evaluation of patient's response to treatment, examination of patient, obtaining history from patient or surrogate, ordering and performing treatments and interventions, ordering and review of laboratory studies, ordering and review of radiographic studies, pulse oximetry and re-evaluation of patient's condition.   Medications Ordered in ED Medications  sodium chloride flush (NS) 0.9 % injection 3 mL (3 mLs Intravenous Given 11/09/2019 0449)  acetaminophen (TYLENOL) tablet 1,000 mg (1,000 mg Oral Given 10/28/2019 0456)  fentaNYL (SUBLIMAZE) injection 50 mcg (50 mcg  Intravenous Given 11/04/2019 0544)    ED Course  I have reviewed the triage vital signs and the nursing notes.  Pertinent labs & imaging results that were available during my care of the patient were reviewed by me and considered in my medical decision making (see chart for details).    MDM Rules/Calculators/A&P  74 y.o. M here with chest pain with radiation into left arm.  He has extensive cardiac history with CABG x5 and multiple prior stents, last in May 2020.  He is followed by cardiology, Dr. Irish Lack.  On exam, patient initially in sinus rhythm but bouncing back and forth between sinus and A. fib.  Heart rate not exceeding 120.  Patient denies any palpitations with this.  He is chronically anticoagulated with Eliquis.  Labs are pending along with chest x-ray.  Pressures are somewhat soft after nitro x2 prior to arrival.  He does have a mild headache.  Will give dose of Tylenol.  5:28 AM Patient did have a noted nearly 7g drop in hemoglobin since March 2021.  Hemoccult was performed here and is +.  No gross blood on DRE.  He does report some recent constipation and using laxative.  Per chart review, last colonoscopy and endoscopy in 2015 by Dr. Hilarie Fredrickson, did have 5 polyps removed at that time.  He has not had any follow-up procedure since then.  Troponin is also elevated today at 61.  Chest x-ray is clear.  Patient will require admission.  States his chest and arm pain are improving, now with some increased back pain which is chronic.  Dose of fentanyl ordered.  Discussed with hospitalist, Dr. Marlowe Sax-- will admit for ongoing care.  Will need GI consultation this morning.  Spoke with cardiology, [facility]-- recommends to hold Eliquis for now, can continue plavix.  They will see this morning and provide consultation.  Final Clinical Impression(s) / ED Diagnoses Final diagnoses:  Chest pain in adult  Gastrointestinal hemorrhage, unspecified gastrointestinal hemorrhage type  Elevated troponin     Rx / DC Orders ED Discharge Orders    None       Larene Pickett, PA-C 60/60/04 [id number]    Delora Fuel, MD 74/14/23 0730

## 2019-10-29 NOTE — Progress Notes (Signed)
    Patient's troponin has increased to 2635.  I suspect he has occluded small marginal branch or perhaps the SVG to his OM 2/OM 3. He remains completely pain-free.  EKG is without any significant changes.  He has an active GI bleed with reduction of his hemoglobin from 16 down to 8.9  Heme + stools  He is on eliquis and plavix   Since he is actively bleeding we do not have any options to do a heart catheterization and PCI.  My suggestion is that we stop the Eliquis and Plavix.  Allow his hemoglobin to stabilize.  He may need transfusion.  We will get an echocardiogram.  If his ejection fraction has remained stable and if he remains hemodynamically stable we can clear him for endoscopy and colonoscopy from a cardiology standpoint.  After he has had an adequate GI evaluation and his bleeding has stabilized / stopped  then we can consider doing a heart catheterization with possible PCI if needed.  Following .    Mertie Moores, MD  10/24/2019 3:43 PM    [place],  [address] [place], [place]  [address] Phone: [phone]; Fax: [phone]

## 2019-10-29 NOTE — Progress Notes (Signed)
cpap brought to PTs room but refused to wear it tonight.

## 2019-10-30 ENCOUNTER — Inpatient Hospital Stay (HOSPITAL_COMMUNITY): Payer: 59

## 2019-10-30 DIAGNOSIS — I214 Non-ST elevation (NSTEMI) myocardial infarction: Principal | ICD-10-CM

## 2019-10-30 DIAGNOSIS — I5040 Unspecified combined systolic (congestive) and diastolic (congestive) heart failure: Secondary | ICD-10-CM

## 2019-10-30 LAB — IRON AND TIBC
Iron: 26 ug/dL — ABNORMAL LOW (ref 45–182)
Saturation Ratios: 8 % — ABNORMAL LOW (ref 17.9–39.5)
TIBC: 339 ug/dL (ref 250–450)
UIBC: 313 ug/dL

## 2019-10-30 LAB — BASIC METABOLIC PANEL
Anion gap: 9 (ref 5–15)
BUN: 19 mg/dL (ref 8–23)
CO2: 27 mmol/L (ref 22–32)
Calcium: 9 mg/dL (ref 8.9–10.3)
Chloride: 101 mmol/L (ref 98–111)
Creatinine, Ser: 1.96 mg/dL — ABNORMAL HIGH (ref 0.61–1.24)
GFR calc Af Amer: 38 mL/min — ABNORMAL LOW (ref >60)
GFR calc non Af Amer: 32 mL/min — ABNORMAL LOW (ref >60)
Glucose, Bld: 185 mg/dL — ABNORMAL HIGH (ref 70–99)
Potassium: 4.3 mmol/L (ref 3.5–5.1)
Sodium: 137 mmol/L (ref 135–145)

## 2019-10-30 LAB — ECHOCARDIOGRAM COMPLETE
Height: 72 in
Weight: 4361.6 oz

## 2019-10-30 LAB — GLUCOSE, CAPILLARY
Glucose-Capillary: 168 mg/dL — ABNORMAL HIGH (ref 70–99)
Glucose-Capillary: 226 mg/dL — ABNORMAL HIGH (ref 70–99)
Glucose-Capillary: 225 mg/dL — ABNORMAL HIGH (ref 70–99)
Glucose-Capillary: 240 mg/dL — ABNORMAL HIGH (ref 70–99)

## 2019-10-30 LAB — RETICULOCYTES
Immature Retic Fract: 33.2 % — ABNORMAL HIGH (ref 2.3–15.9)
RBC.: 3.15 MIL/uL — ABNORMAL LOW (ref 4.22–5.81)
Retic Count, Absolute: 133.9 10*3/uL (ref 19.0–186.0)
Retic Ct Pct: 4.3 % — ABNORMAL HIGH (ref 0.4–3.1)

## 2019-10-30 LAB — FOLATE: Folate: 6.6 ng/mL (ref >5.9)

## 2019-10-30 LAB — FERRITIN: Ferritin: 24 ng/mL (ref 24–336)

## 2019-10-30 LAB — HEMOGLOBIN AND HEMATOCRIT, BLOOD
HCT: 30.7 % — ABNORMAL LOW (ref 39.0–52.0)
Hemoglobin: 9.3 g/dL — ABNORMAL LOW (ref 13.0–17.0)

## 2019-10-30 LAB — TROPONIN I (HIGH SENSITIVITY): Troponin I (High Sensitivity): 98 ng/L — ABNORMAL HIGH (ref <18)

## 2019-10-30 LAB — MAGNESIUM: Magnesium: 2 mg/dL (ref 1.7–2.4)

## 2019-10-30 LAB — VITAMIN B12: Vitamin B-12: 205 pg/mL (ref 180–914)

## 2019-10-30 MED ORDER — MIDODRINE HCL 5 MG PO TABS
2.5000 mg | ORAL_TABLET | Freq: Three times a day (TID) | ORAL | Status: DC
  Administered 2019-10-31 – 2019-11-03 (×10): 2.5 mg via ORAL
  Filled 2019-10-30 (×12): qty 1

## 2019-10-30 MED ORDER — TRAZODONE HCL 50 MG PO TABS
100.0000 mg | ORAL_TABLET | Freq: Every day | ORAL | Status: DC
  Administered 2019-10-30 – 2019-11-04 (×4): 100 mg via ORAL
  Filled 2019-10-30 (×6): qty 1

## 2019-10-30 MED ORDER — SERTRALINE HCL 50 MG PO TABS
25.0000 mg | ORAL_TABLET | ORAL | Status: DC
  Administered 2019-10-30 – 2019-11-03 (×5): 25 mg via ORAL
  Filled 2019-10-30 (×6): qty 1

## 2019-10-30 MED ORDER — POLYETHYLENE GLYCOL 3350 17 G PO PACK: 17.0000 g | PACK | Freq: Every day | ORAL | Status: DC | PRN

## 2019-10-30 MED ORDER — NORTRIPTYLINE HCL 25 MG PO CAPS
50.0000 mg | ORAL_CAPSULE | Freq: Every day | ORAL | Status: DC
  Administered 2019-10-30 – 2019-11-05 (×6): 50 mg via ORAL
  Filled 2019-10-30 (×6): qty 2

## 2019-10-30 NOTE — Progress Notes (Signed)
Results for MALAKI, KOURY (MRN [id number]) as of 10/30/2019 14:09  Ref. Range 11/09/2019 15:35 10/17/2019 16:15 10/28/2019 23:16 10/30/2019 07:47 10/30/2019 12:36  Glucose-Capillary Latest Ref Range: 70 - 99 mg/dL 290 (H) 264 (H) 334 (H) 168 (H) 226 (H)  Noted that postprandial CBGs are greater than 200 mg/dl.  Recommend adding Novolog 5 units TID with meals if patient eats at least 50% of meal and blood sugars continue to be elevated.   Harvel Ricks RN BSN CDE Diabetes Coordinator Pager: [phone]  8am-5pm

## 2019-10-30 NOTE — Progress Notes (Addendum)
Progress Note  Patient Name: Matthew [name] Date of Encounter: 10/30/2019  Primary Cardiologist: Larae Grooms, MD   Subjective   74 y.o. male with a hx of coronary artery disease, CABG, hypertension, hyperlipidemia, paroxysmal atrial fibrillation, hypothyroidism, CKD who presented 11/07/2019 with chest pain and was found to have a GI bleed and  elevated troponins, elevated Cr and acute anemia with drop in Hgb from 16 to 8.  Heme+ stools.  eliquis and plavix held.   Last heart catheterization was May, 2020 which revealed severe native coronary artery disease .  He had patent SVG to RCA,  LIMA to LAD, The SVG to diag is chronically occluded  The SVG to OM2, OM3 had a tight stenosis which was stented.  Today feels better, no chest pain and only SOB if up to BR.  In and out of a fib.   Inpatient Medications    Scheduled Meds: . buPROPion  300 mg Oral Daily  . dronedarone  400 mg Oral BID WC  . ezetimibe  5 mg Oral QHS  . ferrous sulfate  325 mg Oral Q M,W,F  . finasteride  5 mg Oral QHS  . gabapentin  300 mg Oral BID  . insulin aspart  0-15 Units Subcutaneous TID WC  . insulin aspart  0-5 Units Subcutaneous QHS  . insulin detemir  25 Units Subcutaneous BID  . levothyroxine  150 mcg Oral QAC breakfast  . magnesium oxide  400 mg Oral BID  . melatonin  3 mg Oral QHS  . metoprolol succinate  12.5 mg Oral Daily  . midodrine  2.5 mg Oral TID  . rosuvastatin  40 mg Oral QHS  . senna  2 tablet Oral q AM   And  . senna  1 tablet Oral QHS  . tamsulosin  0.4 mg Oral Daily   Continuous Infusions:  PRN Meds: acetaminophen, alum & mag hydroxide-simeth, lidocaine, oxyCODONE-acetaminophen **AND** oxyCODONE, triamcinolone cream   Vital Signs    Vitals:   11/04/2019 1645 10/28/2019 2314 10/30/19 0115 10/30/19 0358  BP: 111/66 (!) 97/51 100/67 (!) 107/51  Pulse: 94 69 68 74  Resp: 15 17 16 20   Temp: (!) 97.3 F (36.3 C) 98.3 F (36.8 C) 97.9 F (36.6 C) (!) 97.5 F (36.4 C)   TempSrc: Oral Oral Oral Oral  SpO2: 100% 93% 100% 100%  Weight: 123.8 kg   123.7 kg  Height: 6' (1.829 m)       Intake/Output Summary (Last 24 hours) at 10/30/2019 0745 Last data filed at 10/30/2019 0300 Gross per 24 hour  Intake 1099.35 ml  Output 525 ml  Net 574.35 ml   Last 3 Weights 10/30/2019 10/16/2019 09/29/2019  Weight (lbs) 272 lb 9.6 oz 273 lb 274 lb 8 oz  Weight (kg) 123.651 kg 123.832 kg 124.512 kg      Telemetry    A fib comes and goes,  And SR. - Personally Reviewed  ECG    SR with RBBB and LAD  - Personally Reviewed  Physical Exam   GEN: No acute distress.   Neck: No JVD Cardiac: IRRR, no murmurs, rubs, or gallops.  Respiratory: Clear to diminished to auscultation bilaterally. GI: Soft, nontender, non-distended  MS: No edema; No deformity. Neuro:  Nonfocal  Psych: Normal affect   Labs    High Sensitivity Troponin:   Recent Labs  Lab 11/06/2019 0453 10/31/2019 0725 10/13/2019 1344 10/17/2019 1702  TROPONINIHS 61* 98* 2,635* 4,646*      Chemistry Recent Labs  Lab 10/25/2019 0453 10/30/19 0400  NA 136 137  K 4.5 4.3  CL 102 101  CO2 23 27  GLUCOSE 243* 185*  BUN 22 19  CREATININE 2.13* 1.96*  CALCIUM 8.5* 9.0  GFRNONAA 29* 32*  GFRAA 34* 38*  ANIONGAP 11 9     Hematology Recent Labs  Lab 10/16/2019 0453 10/19/2019 0725 11/01/2019 1325 10/13/2019 1702 10/30/19 0400  WBC 9.0  --   --   --   --   RBC 3.03*  --   --   --  3.15*  HGB 8.8*   < > 8.9* 9.7* 9.3*  HCT 29.7*   < > 29.4* 32.4* 30.7*  MCV 98.0  --   --   --   --   MCH 29.0  --   --   --   --   MCHC 29.6*  --   --   --   --   RDW 14.3  --   --   --   --   PLT 347  --   --   --   --    < > = values in this interval not displayed.    BNPNo results for input(s): BNP, PROBNP in the last 168 hours.   DDimer No results for input(s): DDIMER in the last 168 hours.   Radiology    DG Chest 2 View  Result Date: 11/04/2019 CLINICAL DATA:  Chest pain. EXAM: CHEST - 2 VIEW COMPARISON:   08/22/2019 FINDINGS: The cardiac silhouette, mediastinal and hilar contours are within normal limits and stable. Stable surgical changes related to bypass surgery. Coronary artery stents are noted. Stable mild tortuosity and calcification of the thoracic aorta. No acute pulmonary findings. No pleural effusions. The bony thorax is intact. IMPRESSION: No acute cardiopulmonary findings. Electronically Signed   By: Marijo Sanes M.D.   On: 10/30/2019 05:33    Cardiac Studies   Echo pending   Cardiac cath 10/15/18   Prox RCA lesion is 100% stenosed.  SVG graft was visualized by angiography and is normal in caliber.  Prox Cx to Mid Cx lesion is 100% stenosed.  SVG graft was visualized by angiography and is normal in caliber.  Ost LAD to Prox LAD lesion is 100% stenosed.  LIMA graft was visualized by angiography and is normal in caliber.  SVG graft was not injected.  Mid Graft lesion before 2nd Mrg is 99% stenosed.  A drug-eluting stent was successfully placed using a STENT RESOLUTE [id number].  Post intervention, there is a 0% residual stenosis.   1. 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 Chronic occlusion proximal LAD. Patent LIMA to mid LAD. Chronic occlusion of the vein graft to the Diagonal branch (not injected-known occlusion) 3. Chronic occlusion mid Circumflex. Patent sequential vein graft to OM2/OM3. Severe stenosis mid body of SVG to OM2/OM3 within the old stent 4. Successful PTCA/DES x 1 mid body of SVG to OM2/OM3 5. Chronic occlusion proximal RCA. Patent SVG to distal RCA  Recommendations: ASA and Plavix along with Eliquis for one month. Would stop ASA at one month and continue Plavix along with Eliquis for at least one year. Continue statin and beta blocker. Resume Eliquis tomorrow.    Patient Profile     74 y.o. male with a hx of coronary artery disease, CABG, hypertension, hyperlipidemia, paroxysmal atrial fibrillation, hypothyroidism, CKD who  presents with chest pain and was found to have a GI bleed and elevated troponins  Assessment & Plan    Acute GI bleed with stopping of eliquis and plavix.  GI has seenfor colonoscopy when cardiology clears  hgb 9.3 this AM Keep Hgb above 8 with MI  NSTEMI with pk tropoin 4646 without acute EKG changes or current chest pain.  Plan to hold anticoagulation due to acute bleed check echo. Once stable from GI and Renal would consider cardiac cath.    CAD with cath 2020 which revealed severe native coronary artery disease .  He had patent SVG to RCA,  LIMA to LAD, The SVG to diag is chronically occluded  The SVG to OM2, OM3 had a tight stenosis which was stented.   AKI on chronic AKI, improved today with Cr from 2.13 on admit to 1.96 today though he has been 1.83 to 2.26 in the past. Per IM  PAF was with RVR on admit goes in out of SR.  Eliquis on hold- on multaq, and metoprolol (allergy to amiodarone) BP is soft not sure we can add dilt or increase BB.   Orthostatic hypotension on midodrine. Per IM   DM-2 on insulin followed by IM.    HLD on crestor.          For questions or updates, please contact [place] Please consult [email or web address] for contact info under        Signed, Cecilie Kicks, NP  10/30/2019, 7:45 AM    Attending Note:   The patient was seen and examined.  Agree with assessment and plan as noted above.  Changes made to the above note as needed.  Patient seen and independently examined with Cecilie Kicks, NP .   We discussed all aspects of the encounter. I agree with the assessment and plan as stated above.  1.   NSTEMI:  Troponin is 4646 this am.   No further episodes of CP since his orignial CP 2 nights ago .  Echo is scheduled for today  Our plan is to cath him as soon as he is deemed stable from a GI standpoint.  The plan is for EGD and colonoscopy on Monday   2.  GI bleeding :   Hb is up slightly , further work up per GI team .   3.  Acute on chronic  kidney injury:   Plans per IM .     I have spent a total of 40 minutes with patient reviewing hospital  notes , telemetry, EKGs, labs and examining patient as well as establishing an assessment and plan that was discussed with the patient. > 50% of time was spent in direct patient care.    Thayer Headings, Brooke Bonito., MD, [facility] 10/30/2019, 9:11 AM [address]  [place] Pager [phone]

## 2019-10-30 NOTE — Progress Notes (Signed)
   11/06/2019 2314  Assess: MEWS Score  Temp 98.3 F (36.8 C)  BP (!) 97/51  Pulse Rate 69  Resp 17  SpO2 93 %  O2 Device Room Air  Assess: MEWS Score  MEWS Temp 0  MEWS Systolic 1  MEWS Pulse 0  MEWS RR 1  MEWS LOC 0  MEWS Score 2  MEWS Score Color Yellow  Assess: if the MEWS score is Yellow or Red  Were vital signs taken at a resting state? Yes  Focused Assessment Documented focused assessment  Early Detection of Sepsis Score *See Row Information* Low  MEWS guidelines implemented *See Row Information* Yes  Treat  MEWS Interventions Administered scheduled meds/treatments  Take Vital Signs  Increase Vital Sign Frequency  Yellow: Q 2hr X 2 then Q 4hr X 2, if remains yellow, continue Q 4hrs  Escalate  MEWS: Escalate Yellow: discuss with charge nurse/RN and consider discussing with provider and RRT  Notify: Charge Nurse/RN  Name of Charge Nurse/RN Notified Crisoforo Oxford, RN  Date Charge Nurse/RN Notified 10/30/19  Time Charge Nurse/RN Notified 0000  Document  Patient Outcome Stabilized after interventions  Progress note created (see row info) Yes   Elesa Hacker, RN

## 2019-10-30 NOTE — Progress Notes (Signed)
Patient refused to wear CPAP at this time; states he doesn't need it. RT advised if he changes his mind to advise RN and will come back and set up a hospital provided CPAP for him.  RT removed CPAP from the room.

## 2019-10-30 NOTE — Progress Notes (Addendum)
Progress Note  CC:          ASSESSMENT AND PLAN:    # FOBT on Plavix and Eliquis /  Normocytic anemia  --Plavix and Eliquis on hold --Plan is for EGD / colonoscopy on Monday.   # Constipation.  --Just had Senokot. Will add Miralax to take as needed.    # Chest pain / elevated troponins / known CAD / remote CABG and stents.  --Cardiology has evaluated. Feels like elevated troponin related to demand ischemia / CKD and not representative of ACS. Still continuing to monitoring him.  --Cardiology needs evaluation of FOBT+ anemia evaluated prior to cardiac cath with possible PC  # Remote hx of UC. No active disease on colonoscopies over last several years  # NASH. Fibrosis score F3/F4. No radiographic evidence of cirrhosis.      SUBJECTIVE   Has been having some mild lower abdominal discomfort. Last BM 3 days ago. Just got a Senna   OBJECTIVE:     Vital signs in last 24 hours: Temp:  [97.3 F (36.3 C)-98.3 F (36.8 C)] 97.6 F (36.4 C) (05/21 0833) Pulse Rate:  [52-142] 84 (05/21 0833) Resp:  [13-20] 20 (05/21 0358) BP: (97-123)/(51-109) 109/73 (05/21 0833) SpO2:  [93 %-100 %] 100 % (05/21 0833) Weight:  [123.7 kg-123.8 kg] 123.7 kg (05/21 0358)   General:   Alert, in NAD Heart:  Regular rate and rhythm.   Pulm: Normal respiratory effort   Abdomen:  Soft,  nontender, nondistended.  Normal bowel sounds.          Neurologic:  Alert and  oriented,  grossly normal neurologically. Psych:  Pleasant, cooperative.  Normal mood and affect.   Intake/Output from previous day: 05/20 0701 - 05/21 0700 In: 1099.4 [P.O.:960; I.V.:139.4] Out: 525 [Urine:525] Intake/Output this shift: Total I/O In: 480 [P.O.:480] Out: -   Lab Results: Recent Labs    10/31/2019 0453 11/09/2019 0725 10/18/2019 1325 11/03/2019 1702 10/30/19 0400  WBC 9.0  --   --   --   --   HGB 8.8*   < > 8.9* 9.7* 9.3*  HCT 29.7*   < > 29.4* 32.4* 30.7*  PLT 347  --   --   --   --    < > = values in  this interval not displayed.   BMET Recent Labs    10/15/2019 0453 10/30/19 0400  NA 136 137  K 4.5 4.3  CL 102 101  CO2 23 27  GLUCOSE 243* 185*  BUN 22 19  CREATININE 2.13* 1.96*  CALCIUM 8.5* 9.0   LFT No results for input(s): PROT, ALBUMIN, AST, ALT, ALKPHOS, BILITOT, BILIDIR, IBILI in the last 72 hours. PT/INR No results for input(s): LABPROT, INR in the last 72 hours. Hepatitis Panel No results for input(s): HEPBSAG, HCVAB, HEPAIGM, HEPBIGM in the last 72 hours.  DG Chest 2 View  Result Date: 10/17/2019 CLINICAL DATA:  Chest pain. EXAM: CHEST - 2 VIEW COMPARISON:  08/22/2019 FINDINGS: The cardiac silhouette, mediastinal and hilar contours are within normal limits and stable. Stable surgical changes related to bypass surgery. Coronary artery stents are noted. Stable mild tortuosity and calcification of the thoracic aorta. No acute pulmonary findings. No pleural effusions. The bony thorax is intact. IMPRESSION: No acute cardiopulmonary findings. Electronically Signed   By: Marijo Sanes M.D.   On: 11/06/2019 05:33    LOS: 1 day   Tye Savoy ,NP 10/30/2019, 8:43 AM      ________________________________________________________________________  [facility] MD note:  I personally examined the patient, reviewed the data and agree with the assessment and plan described above. Will return on Sunday to get him ready for colonoscoyp/EGD on Monday (allowing plavix washout)   Owens Loffler, MD [facility] Pager [phone]

## 2019-10-30 NOTE — Progress Notes (Signed)
PROGRESS NOTE  NOLTON DENIS OZY:[id number] DOB: [birth year] DOA: 10/19/2019 PCP: Marda Stalker, PA-C  Brief History    Matthew [name] is a 74 y.o. male with medical history significant of HTN, HLD, PAF on Eliquis, CAD s/p CABGx5v and stents, AAA, hypothyroidism, CKD stage III, chronic pain, and GERD who presented with complaints of chest pain.  Patient had been watching the basketball game last night and never really got to sleep with he had acute onset of left-sided chest pain.  He described it as dull pain with radiation down his arm.  With the chest pain he denied having any diaphoresis, nausea, vomiting, or shortness of breath.  Reported having similar symptoms 3 days ago with diaphoresis and nausea, but resolved with nitroglycerin.  Last night when he attempted to take a nitroglycerin he had no relief in symptoms.  Denies any recent change in weight, leg swelling, abdominal pain in, or diarrhea.  Other associated symptoms include chronic shortness of breath unchanged, chronic constipation for which he is on stool softeners, and reported seeing some bright red blood in his stools approximately 3 days ago.  He is not on any NSAIDs, but is on Eliquis and Plavix.  Patient had a left heart cath by Dr. Angelena Form in 10/2018 which showed severe triple-vessel coronary artery disease status post 5 vessel CABG with 4/5 patent grafts with successful DES to mid body of SVG to OM2/OM3.  Patient had prior colonoscopy in 2015 noted 5 sessile polyps which were removed.  Endoscopy from 2016 and multiple gastric polyps and Barrett's esophagus.  Patient was given 324 mg of aspirin in route with EMS  ED Course: Upon admission into the emergency department patient was seen to be afebrile with heart rates elevated up to 154, and all other vital signs maintained.  Labs significant for hemoglobin 8.8, BUN 22, creatinine 2.13, glucose 243, and high-sensitivity troponin 61 which has increased to 98 and then 4646 .   Stool guaiacs were noted to be positive, but not reported to have gross blood on rectal exam.  Chest x-ray showed no acute abnormalities.  Patient was given Tylenol and fentanyl.  TRH called to admit.  The patient has been admitted to a telemetry bed. Cardiology and gastroenterology  have been consulted. Echocardiogram pending. Plavix and Eliquis have been held for now. Evaluation for possible source of GI bleed will need to be completed before cardiology can entertain LHC which will be necessary. Gastroenterology will perform EGD and colonoscopy planned for Monday when plavix has had a chance to wash out. We will monitor Hgb and transfuse for Hgb greater than 8.0.  Consultants  . Gastroenterology . Cardiology  Procedures  . None  Antibiotics   Anti-infectives (From admission, onward)   None    .  Subjective  The patient is resting comfortably. He has no new complaints. He did want to make sure that he got his antidepressants as he takes them at home.  Objective   Vitals:  Vitals:   10/30/19 0833 10/30/19 1251  BP: 109/73 110/60  Pulse: 84 74  Resp:    Temp: 97.6 F (36.4 C) 97.9 F (36.6 C)  SpO2: 100% 99%   Exam:  Constitutional:  . The patient is awake, alert, and oriented x 3. No acute distress. Respiratory:  . No increased work of breathing. . No wheezes, rales, or rhonchi . No tactile fremitus Cardiovascular:  . Regular rate and rhythm . No murmurs, ectopy, or gallups. . No lateral PMI. No thrills.  Abdomen:  . Abdomen is soft, non-tender, non-distended . No hernias, masses, or organomegaly . Normoactive bowel sounds.  Musculoskeletal:  . No cyanosis, clubbing, or edema Skin:  . No rashes, lesions, ulcers . palpation of skin: no induration or nodules Neurologic:  . CN 2-12 intact . Sensation all 4 extremities intact Psychiatric:  . Mental status o Mood, affect appropriate o Orientation to person, place, time  . judgment and insight appear intact  I  have personally reviewed the following:   Today's Data  . Vitals, BMP, troponin, CBC  Cardiology Data  . Echocardiogram: Pending.  Scheduled Meds: . buPROPion  300 mg Oral Daily  . dronedarone  400 mg Oral BID WC  . ezetimibe  5 mg Oral QHS  . ferrous sulfate  325 mg Oral Q M,W,F  . finasteride  5 mg Oral QHS  . gabapentin  300 mg Oral BID  . insulin aspart  0-15 Units Subcutaneous TID WC  . insulin aspart  0-5 Units Subcutaneous QHS  . insulin detemir  25 Units Subcutaneous BID  . levothyroxine  150 mcg Oral QAC breakfast  . magnesium oxide  400 mg Oral BID  . melatonin  3 mg Oral QHS  . metoprolol succinate  12.5 mg Oral Daily  . [START ON 10/31/2019] midodrine  2.5 mg Oral TID WC  . nortriptyline  50 mg Oral QHS  . rosuvastatin  40 mg Oral QHS  . senna  2 tablet Oral q AM   And  . senna  1 tablet Oral QHS  . sertraline  25 mg Oral BH-q7a  . tamsulosin  0.4 mg Oral Daily  . traZODone  100 mg Oral QHS   Continuous Infusions:  Principal Problem:   Chest pain Active Problems:   Hypothyroidism   PAF (paroxysmal atrial fibrillation) (HCC)   CKD (chronic kidney disease), stage III   AAA (abdominal aortic aneurysm) (HCC)   Diabetic polyneuropathy associated with type 2 diabetes mellitus (HCC)   OSA (obstructive sleep apnea)   Prolonged QT interval   Depression   GI bleed   Acute blood loss anemia   LOS: 1 day   A & P  Chest pain/Elevated troponin/ Known CAD: Acute on chronic.  Patient presented with complaints of chest pain which woke him out of sleep.  Not relieved with nitroglycerin.  Troponin 61->98. Chest x-ray was noted to be clear.  Patient's last heart catheter from 10/2018 with severe triple vessel coronary artery disease 4/5 patent grafts and had drug-eluting stent placed at the mid body of SVG to OM2/OM3.  Suspect secondary to demand in the setting of acute blood loss. The patient has been admitred to a progressive bed. Troponins have been trended down to 13.  Plavix and Eliquis have been held pending GI eval. LHC is anticipated if patient can be cleared by GI after EGD and colonoscopy Echocardiogram is pending. I appreciate cardiology's help.   GI bleed/Acute blood loss anemia: Acute.  Patient's baseline hemoglobin previously noted to be 15.1 on 08/22/19, but patient presents with hemoglobin down to 8.8.  Stool guaiacs were noted to be positive. Review of records note previous history of gastric as well as sessile polyps on previous EGD and colonoscopy from 2016 and 2015 respectively.  Patient's Eliquis and plavix have been held. Cardiology is aware. Clear liquid diet as tolerated and he has been advanced to a carb modified diet. Hemoglobin will be monitored Q8hours and the patient will be transfused for a hemoglobin of less than  8.0 per cardilogy's recommendation. I appreciate GI consultative services.  Paroxysmal atrial fibrillation on chronic anticoagulation: Patient sinus tachycardia with heart rates into the 120s.  CHA2DS2-VASc score = >2. Home medications include Eliquis, dronedarone 400 mg twice daily, and metoprolol 12.5 mg daily. Hold Eliquis due to acute blood loss anemia suspected secondary to GI bleed. Continue metoprolol and dronedarone.  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  Patient presents with glucose elevated to 243.  Last hemoglobin A1c noted to be 7.6 on 08/22/2019.  Home medications include Levemir 50 units twice daily. The patient is on a carbohydrate modified diet and a hypoglycemic protocol. He has been continued on gabapentin and glucoses will be followed with FSBS and SSI. He will be continued on a reduced dose of lantus, 35 units. Monitior.  Prolonged QT interval: QTC elevated up to 532 on admission. Correct any electrolyte abnormalities. Continue to monitor QT interval.  AAA: 3 x 3.2 cm aneurysm of the distal abdominal aorta on CT in 10/2018. Likely needs follow-up surveillance sometime this month.   Hypothyroidism: Continue levothyroxine  Depression: Home medications include Zoloft 25 mg every morning, Wellbutrin 300 mg daily, nortriptyline 25 mg nightly, and trazodone 100 mg nightly. These have been continued due to the patient's explicit request.  Chronic kidney disease stage IV/IIIb: Creatinine 2.13 which appears near patient's baseline.  Mild memory loss: Patient just recently started on Namenda.  Hyperlipidemia: Continue Zetia, fenofibrate, and Crestor  GERD: Hold PPI due to prolonged QT interval  OSA on CPAP: Continue CPAP per RT  I have seen and examined this patient myself. I have spent 38 minutes in his evaluation and care.  DVT prophylaxis: SCDs Code Status: Full  Family Communication: Wife updated over the phone  Disposition Plan: The patient is from home. Anticipate discharge to home. Barriers to discharge: Plan for EGD and Colonoscopy on 10/30/2019. Plan for [facility] by cardiology when cleared by GI. Need to monitor hemoglobin and telemetry closely.  Ava Swayze, DO [facility] Direct contact: see [email or web address]  7PM-7AM contact night coverage as above 10/30/2019, 4:36 PM  LOS: 1 day

## 2019-10-30 NOTE — Progress Notes (Signed)
  Echocardiogram 2D Echocardiogram has been performed.  Matthew [name] 10/30/2019, 12:56 PM

## 2019-10-31 DIAGNOSIS — N179 Acute kidney failure, unspecified: Secondary | ICD-10-CM

## 2019-10-31 DIAGNOSIS — K922 Gastrointestinal hemorrhage, unspecified: Secondary | ICD-10-CM

## 2019-10-31 DIAGNOSIS — N1832 Chronic kidney disease, stage 3b: Secondary | ICD-10-CM

## 2019-10-31 DIAGNOSIS — E785 Hyperlipidemia, unspecified: Secondary | ICD-10-CM

## 2019-10-31 DIAGNOSIS — I10 Essential (primary) hypertension: Secondary | ICD-10-CM

## 2019-10-31 DIAGNOSIS — I25708 Atherosclerosis of coronary artery bypass graft(s), unspecified, with other forms of angina pectoris: Secondary | ICD-10-CM

## 2019-10-31 LAB — GLUCOSE, CAPILLARY
Glucose-Capillary: 133 mg/dL — ABNORMAL HIGH (ref 70–99)
Glucose-Capillary: 220 mg/dL — ABNORMAL HIGH (ref 70–99)
Glucose-Capillary: 253 mg/dL — ABNORMAL HIGH (ref 70–99)
Glucose-Capillary: 254 mg/dL — ABNORMAL HIGH (ref 70–99)
Glucose-Capillary: 237 mg/dL — ABNORMAL HIGH (ref 70–99)

## 2019-10-31 LAB — CBC WITH DIFFERENTIAL/PLATELET
Abs Immature Granulocytes: 0.06 10*3/uL (ref 0.00–0.07)
Basophils Absolute: 0.1 10*3/uL (ref 0.0–0.1)
Basophils Relative: 1 %
Eosinophils Absolute: 0.2 10*3/uL (ref 0.0–0.5)
Eosinophils Relative: 2 %
HCT: 27.9 % — ABNORMAL LOW (ref 39.0–52.0)
Hemoglobin: 8.4 g/dL — ABNORMAL LOW (ref 13.0–17.0)
Immature Granulocytes: 1 %
Lymphocytes Relative: 30 %
Lymphs Abs: 2.5 10*3/uL (ref 0.7–4.0)
MCH: 29.3 pg (ref 26.0–34.0)
MCHC: 30.1 g/dL (ref 30.0–36.0)
MCV: 97.2 fL (ref 80.0–100.0)
Monocytes Absolute: 0.7 10*3/uL (ref 0.1–1.0)
Monocytes Relative: 8 %
Neutro Abs: 5 10*3/uL (ref 1.7–7.7)
Neutrophils Relative %: 58 %
Platelets: 277 10*3/uL (ref 150–400)
RBC: 2.87 MIL/uL — ABNORMAL LOW (ref 4.22–5.81)
RDW: 14.5 % (ref 11.5–15.5)
WBC: 8.5 10*3/uL (ref 4.0–10.5)
nRBC: 0 % (ref 0.0–0.2)

## 2019-10-31 LAB — BASIC METABOLIC PANEL
Anion gap: 10 (ref 5–15)
BUN: 19 mg/dL (ref 8–23)
CO2: 26 mmol/L (ref 22–32)
Calcium: 9 mg/dL (ref 8.9–10.3)
Chloride: 103 mmol/L (ref 98–111)
Creatinine, Ser: 2.08 mg/dL — ABNORMAL HIGH (ref 0.61–1.24)
GFR calc Af Amer: 35 mL/min — ABNORMAL LOW (ref >60)
GFR calc non Af Amer: 30 mL/min — ABNORMAL LOW (ref >60)
Glucose, Bld: 218 mg/dL — ABNORMAL HIGH (ref 70–99)
Potassium: 5.1 mmol/L (ref 3.5–5.1)
Sodium: 139 mmol/L (ref 135–145)

## 2019-10-31 MED ORDER — NITROGLYCERIN 0.4 MG SL SUBL
0.4000 mg | SUBLINGUAL_TABLET | SUBLINGUAL | Status: DC | PRN
  Administered 2019-10-31 – 2019-11-05 (×5): 0.4 mg via SUBLINGUAL
  Filled 2019-10-31 (×4): qty 1

## 2019-10-31 MED ORDER — NITROGLYCERIN 0.4 MG SL SUBL
SUBLINGUAL_TABLET | SUBLINGUAL | Status: AC
  Administered 2019-10-31: 0.4 mg via SUBLINGUAL
  Filled 2019-10-31: qty 1

## 2019-10-31 MED ORDER — SODIUM CHLORIDE 0.9 % IV SOLN
510.0000 mg | Freq: Once | INTRAVENOUS | Status: AC
  Administered 2019-10-31: 510 mg via INTRAVENOUS
  Filled 2019-10-31: qty 17

## 2019-10-31 NOTE — Progress Notes (Signed)
PROGRESS NOTE  Matthew [name]:[id number] DOB: Nov 29, 1945 DOA: 10/10/2019 PCP: Marda Stalker, PA-C  Brief History    Matthew [name] is a 74 y.o. male with medical history significant of HTN, HLD, PAF on Eliquis, CAD s/p CABGx5v and stents, AAA, hypothyroidism, CKD stage III, chronic pain, and GERD who presented with complaints of chest pain.  Patient had been watching the basketball game last night and never really got to sleep with he had acute onset of left-sided chest pain.  He described it as dull pain with radiation down his arm.  With the chest pain he denied having any diaphoresis, nausea, vomiting, or shortness of breath.  Reported having similar symptoms 3 days ago with diaphoresis and nausea, but resolved with nitroglycerin.  Last night when he attempted to take a nitroglycerin he had no relief in symptoms.  Denies any recent change in weight, leg swelling, abdominal pain in, or diarrhea.  Other associated symptoms include chronic shortness of breath unchanged, chronic constipation for which he is on stool softeners, and reported seeing some bright red blood in his stools approximately 3 days ago.  He is not on any NSAIDs, but is on Eliquis and Plavix.  Patient had a left heart cath by Dr. Angelena Form in 10/2018 which showed severe triple-vessel coronary artery disease status post 5 vessel CABG with 4/5 patent grafts with successful DES to mid body of SVG to OM2/OM3.  Patient had prior colonoscopy in 2015 noted 5 sessile polyps which were removed.  Endoscopy from 2016 and multiple gastric polyps and Barrett's esophagus.  Patient was given 324 mg of aspirin in route with EMS  ED Course: Upon admission into the emergency department patient was seen to be afebrile with heart rates elevated up to 154, and all other vital signs maintained.  Labs significant for hemoglobin 8.8, BUN 22, creatinine 2.13, glucose 243, and high-sensitivity troponin 61 which has increased to 98 and then 4646 .   Stool guaiacs were noted to be positive, but not reported to have gross blood on rectal exam.  Chest x-ray showed no acute abnormalities.  Patient was given Tylenol and fentanyl.  TRH called to admit.  The patient has been admitted to a telemetry bed. Cardiology and gastroenterology  have been consulted. Echocardiogram pending. Plavix and Eliquis have been held for now. Evaluation for possible source of GI bleed will need to be completed before cardiology can entertain LHC which will be necessary. Gastroenterology will perform EGD and colonoscopy planned for Monday when plavix has had a chance to wash out. We will monitor Hgb and transfuse for Hgb greater than 8.0.  Consultants  . Gastroenterology . Cardiology  Procedures  . None  Antibiotics   Anti-infectives (From admission, onward)   None     Subjective  The patient is resting comfortably. He had an episode of chest pain this morning that subsided with nitroglycerin. Resolved now.  Objective   Vitals:  Vitals:   10/31/19 1108 10/31/19 1332  BP: 100/74 111/68  Pulse: (!) 101 93  Resp:  19  Temp:  97.8 F (36.6 C)  SpO2:  95%   Exam:  Constitutional:  . The patient is awake, alert, and oriented x 3. No acute distress. Respiratory:  . No increased work of breathing. . No wheezes, rales, or rhonchi . No tactile fremitus Cardiovascular:  . Regular rate and rhythm . No murmurs, ectopy, or gallups. . No lateral PMI. No thrills. Abdomen:  . Abdomen is soft, non-tender, non-distended . No  hernias, masses, or organomegaly . Normoactive bowel sounds.  Musculoskeletal:  . No cyanosis, clubbing, or edema Skin:  . No rashes, lesions, ulcers . palpation of skin: no induration or nodules Neurologic:  . CN 2-12 intact . Sensation all 4 extremities intact Psychiatric:  . Mental status o Mood, affect appropriate o Orientation to person, place, time  . judgment and insight appear intact  I have personally reviewed the  following:   Today's Data  . Vitals, BMP, CBC  Cardiology Data  . Echocardiogram: Atrial fibrillation with RVR  Scheduled Meds: . buPROPion  300 mg Oral Daily  . dronedarone  400 mg Oral BID WC  . ezetimibe  5 mg Oral QHS  . ferrous sulfate  325 mg Oral Q M,W,F  . finasteride  5 mg Oral QHS  . gabapentin  300 mg Oral BID  . insulin aspart  0-15 Units Subcutaneous TID WC  . insulin aspart  0-5 Units Subcutaneous QHS  . insulin detemir  25 Units Subcutaneous BID  . levothyroxine  150 mcg Oral QAC breakfast  . magnesium oxide  400 mg Oral BID  . melatonin  3 mg Oral QHS  . metoprolol succinate  12.5 mg Oral Daily  . midodrine  2.5 mg Oral TID WC  . nortriptyline  50 mg Oral QHS  . rosuvastatin  40 mg Oral QHS  . senna  2 tablet Oral q AM   And  . senna  1 tablet Oral QHS  . sertraline  25 mg Oral BH-q7a  . tamsulosin  0.4 mg Oral Daily  . traZODone  100 mg Oral QHS   Continuous Infusions:  Principal Problem:   Chest pain Active Problems:   Hypothyroidism   PAF (paroxysmal atrial fibrillation) (HCC)   CKD (chronic kidney disease), stage III   AAA (abdominal aortic aneurysm) (HCC)   Diabetic polyneuropathy associated with type 2 diabetes mellitus (HCC)   OSA (obstructive sleep apnea)   Prolonged QT interval   Depression   GI bleed   Acute blood loss anemia   Hyperlipidemia LDL goal <70   AKI (acute kidney injury) ([place])   LOS: 2 days   A & P  Chest pain/Elevated troponin/ Known CAD: Acute on chronic.  Patient presented with complaints of chest pain which woke him out of sleep.  Not relieved with nitroglycerin.  Troponin 61->98. Chest x-ray was noted to be clear.  Patient's last heart catheter from 10/2018 with severe triple vessel coronary artery disease 4/5 patent grafts and had drug-eluting stent placed at the mid body of SVG to OM2/OM3.  Suspect secondary to demand in the setting of acute blood loss. The patient has been admitred to a progressive bed. Troponins have  been trended down to 13. Plavix and Eliquis have been held pending GI eval. LHC is anticipated if patient can be cleared by GI after EGD and colonoscopy. Echocardiogram has been performed. It demonstrated EF of 50-55%. There is moderate LVH. Regional wall motion is not well defined. RV systolic function is mildly reduced. RV is moderately enlarged. There is moderately elevated pulmonary artery systolic pressure.  GI bleed/Acute blood loss anemia: Acute.  Patient's baseline hemoglobin previously noted to be 15.1 on 08/22/19, but patient presents with hemoglobin down to 8.8.  Stool guaiacs were noted to be positive. Review of records note previous history of gastric as well as sessile polyps on previous EGD and colonoscopy from 2016 and 2015 respectively.  Patient's Eliquis and plavix have been held. Cardiology is aware.  Clear liquid diet as tolerated and he has been advanced to a carb modified diet. Hemoglobin will be monitored Q8hours and the patient will be transfused for a hemoglobin of less than 8.0 per cardilogy's recommendation. I appreciate GI consultative services. Hgb is 8.4 this am. Monitor and transfuse for hgb less than 8.0.  Paroxysmal atrial fibrillation on chronic anticoagulation: Patient sinus tachycardia with heart rates into the 120s.  CHA2DS2-VASc score = >2. Home medications include Eliquis, dronedarone 400 mg twice daily, and metoprolol 12.5 mg daily. Hold Eliquis due to acute blood loss anemia suspected secondary to GI bleed. Continue metoprolol and dronedarone.  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 Last hemoglobin A1c noted to be 7.6 on 08/22/2019.  Home medications include Levemir 50 units twice daily. The patient is on a carbohydrate modified diet and a hypoglycemic protocol. He has been continued on gabapentin and glucoses will be followed with FSBS and SSI. He will be continued on a reduced dose of lantus, 42 units. Monitior. Glucoses today ran from 133-153.   Prolonged QT interval: QTC elevated up to 532 on admission. Correct any electrolyte abnormalities. Continue to monitor QT interval.  AAA: 3 x 3.2 cm aneurysm of the distal abdominal aorta on CT in 10/2018. Likely needs follow-up surveillance sometime this month.  Hypothyroidism: Continue levothyroxine  Depression: Home medications include Zoloft 25 mg every morning, Wellbutrin 300 mg daily, nortriptyline 25 mg nightly, and trazodone 100 mg nightly. These have been continued due to the patient's explicit request.  Chronic kidney disease stage IV/IIIb: Creatinine 1.96 which appears near patient's baseline.  Mild memory loss: Patient just recently started on Namenda.  Hyperlipidemia: Continue Zetia, fenofibrate, and Crestor  GERD: Hold PPI due to prolonged QT interval  OSA on CPAP: Continue CPAP per RT  I have seen and examined this patient myself. I have spent 32 minutes in his evaluation and care.  DVT prophylaxis: SCDs Code Status: Full  Family Communication: Wife updated over the phone  Disposition Plan: The patient is from home. Anticipate discharge to home. Barriers to discharge: Plan for EGD and Colonoscopy on 10/10/2019. Plan for [facility] by cardiology when cleared by GI. Need to monitor hemoglobin and telemetry closely.  Ava Swayze, DO [facility] Direct contact: see [email or web address]  7PM-7AM contact night coverage as above 10/31/2019, 4:33 PM  LOS: 1 day

## 2019-10-31 NOTE — Progress Notes (Signed)
Progress Note  Patient Name: Matthew [name] Date of Encounter: 10/31/2019  Primary Cardiologist: Larae Grooms, MD   Subjective   Denies chest pain and shortness of breath. Michela Pitcher he goes into atrial fibrillation when he urinates and is otherwise fine.  Inpatient Medications    Scheduled Meds: . buPROPion  300 mg Oral Daily  . dronedarone  400 mg Oral BID WC  . ezetimibe  5 mg Oral QHS  . ferrous sulfate  325 mg Oral Q M,W,F  . finasteride  5 mg Oral QHS  . gabapentin  300 mg Oral BID  . insulin aspart  0-15 Units Subcutaneous TID WC  . insulin aspart  0-5 Units Subcutaneous QHS  . insulin detemir  25 Units Subcutaneous BID  . levothyroxine  150 mcg Oral QAC breakfast  . magnesium oxide  400 mg Oral BID  . melatonin  3 mg Oral QHS  . metoprolol succinate  12.5 mg Oral Daily  . midodrine  2.5 mg Oral TID WC  . nortriptyline  50 mg Oral QHS  . rosuvastatin  40 mg Oral QHS  . senna  2 tablet Oral q AM   And  . senna  1 tablet Oral QHS  . sertraline  25 mg Oral BH-q7a  . tamsulosin  0.4 mg Oral Daily  . traZODone  100 mg Oral QHS   Continuous Infusions:  PRN Meds: acetaminophen, alum & mag hydroxide-simeth, lidocaine, oxyCODONE-acetaminophen **AND** oxyCODONE, polyethylene glycol, triamcinolone cream   Vital Signs    Vitals:   10/30/19 1719 10/30/19 2122 10/31/19 0100 10/31/19 0350  BP: (!) 101/56 115/66 129/84 103/70  Pulse: 66 70 74 (!) 115  Resp:  20 19 18   Temp: 98.7 F (37.1 C) 98.3 F (36.8 C) 98.2 F (36.8 C) 98.2 F (36.8 C)  TempSrc: Oral Oral Oral Oral  SpO2: 93% 100% 96% 96%  Weight:    123.9 kg  Height:        Intake/Output Summary (Last 24 hours) at 10/31/2019 0914 Last data filed at 10/31/2019 0054 Gross per 24 hour  Intake 1080 ml  Output 900 ml  Net 180 ml   Last 3 Weights 10/31/2019 10/30/2019 10/28/2019  Weight (lbs) 273 lb 2.4 oz 272 lb 9.6 oz 273 lb  Weight (kg) 123.9 kg 123.651 kg 123.832 kg      Telemetry    Sinus rhythm,  3 beat NSVT, atrial fibrillation in/out - Personally Reviewed  ECG    NA - Personally Reviewed  Physical Exam   GEN: No acute distress.   Neck: No JVD Cardiac: RRR, no murmurs, rubs, or gallops.  Respiratory: Clear to auscultation bilaterally. GI: Soft, nontender, non-distended  MS: No edema; No deformity. Neuro:  Nonfocal  Psych: Normal affect   Labs    High Sensitivity Troponin:   Recent Labs  Lab 10/20/2019 0453 11/04/2019 0725 11/06/2019 1344 10/15/2019 1702  TROPONINIHS 61* 98* 2,635* 4,646*      Chemistry Recent Labs  Lab 10/26/2019 0453 10/30/19 0400  NA 136 137  K 4.5 4.3  CL 102 101  CO2 23 27  GLUCOSE 243* 185*  BUN 22 19  CREATININE 2.13* 1.96*  CALCIUM 8.5* 9.0  GFRNONAA 29* 32*  GFRAA 34* 38*  ANIONGAP 11 9     Hematology Recent Labs  Lab 11/04/2019 0453 10/16/2019 0725 10/14/2019 1325 10/18/2019 1702 10/30/19 0400  WBC 9.0  --   --   --   --   RBC 3.03*  --   --   --  3.15*  HGB 8.8*   < > 8.9* 9.7* 9.3*  HCT 29.7*   < > 29.4* 32.4* 30.7*  MCV 98.0  --   --   --   --   MCH 29.0  --   --   --   --   MCHC 29.6*  --   --   --   --   RDW 14.3  --   --   --   --   PLT 347  --   --   --   --    < > = values in this interval not displayed.    BNPNo results for input(s): BNP, PROBNP in the last 168 hours.   DDimer No results for input(s): DDIMER in the last 168 hours.   Radiology    ECHOCARDIOGRAM COMPLETE  Result Date: 10/30/2019    ECHOCARDIOGRAM REPORT   Patient Name:   Matthew [name] Date of Exam: 10/30/2019 Medical Rec #:  [id number]          Height:       72.0 in Accession #:    [id number]         Weight:       272.6 lb Date of Birth:  [birth year]          BSA:          2.430 m Patient Age:    61 years           BP:           109/73 mmHg Patient Gender: M                  HR:           72 bpm. Exam Location:  Inpatient Procedure: 2D Echo, Cardiac Doppler and Color Doppler Indications:    I50.40* Unspecified combined systolic (congestive) and  diastolic                 (congestive) heart failure  History:        Patient has prior history of Echocardiogram examinations, most                 recent 10/11/2016. CAD and Previous Myocardial Infarction,                 Abnormal ECG and Prior CABG, Arrythmias:Atrial Fibrillation and                 RBBB, Signs/Symptoms:Chest Pain; Risk Factors:Diabetes,                 Dyslipidemia and Hypertension.  Sonographer:    Roseanna Rainbow RDCS Referring Phys: [id number] RONDELL A SMITH  Sonographer Comments: Technically difficult study due to poor echo windows. Image acquisition challenging due to patient body habitus. IMPRESSIONS  1. Left ventricular ejection fraction, by estimation, is 50 to 55%. The left ventricle has low normal function. Left ventricular endocardial border not optimally defined to evaluate regional wall motion. There is moderate left ventricular hypertrophy. Left ventricular diastolic parameters are indeterminate. Elevated left ventricular end-diastolic pressure.  2. Right ventricular systolic function is mildly reduced. The right ventricular size is moderately enlarged. There is moderately elevated pulmonary artery systolic pressure.  3. The mitral valve is degenerative. Trivial mitral valve regurgitation. No evidence of mitral stenosis.  4. The aortic valve is abnormal. Aortic valve regurgitation is not visualized. Aortic valve mean gradient measures 7.0 mmHg. Aortic valve has moderately reduced cusp mobility, suggesting gradient may be underestimated,  with at least mild aortic valve stenosis.  5. The inferior vena cava is normal in size with greater than 50% respiratory variability, suggesting right atrial pressure of 3 mmHg. FINDINGS  Left Ventricle: Left ventricular ejection fraction, by estimation, is 50 to 55%. The left ventricle has low normal function. Left ventricular endocardial border not optimally defined to evaluate regional wall motion. The left ventricular internal cavity  size was normal in  size. There is moderate left ventricular hypertrophy. Left ventricular diastolic parameters are indeterminate. Elevated left ventricular end-diastolic pressure. Right Ventricle: The right ventricular size is moderately enlarged. No increase in right ventricular wall thickness. Right ventricular systolic function is mildly reduced. There is moderately elevated pulmonary artery systolic pressure. The tricuspid regurgitant velocity is 2.82 m/s, and with an assumed right atrial pressure of 15 mmHg, the estimated right ventricular systolic pressure is 35.3 mmHg. Left Atrium: Left atrial size was normal in size. Right Atrium: Right atrial size was normal in size. Pericardium: There is no evidence of pericardial effusion. Mitral Valve: The mitral valve is degenerative in appearance. Normal mobility of the mitral valve leaflets. Mild to moderate mitral annular calcification. Trivial mitral valve regurgitation. No evidence of mitral valve stenosis. MV peak gradient, 9.7 mmHg. The mean mitral valve gradient is 2.0 mmHg. Tricuspid Valve: The tricuspid valve is normal in structure. Tricuspid valve regurgitation is mild . No evidence of tricuspid stenosis. Aortic Valve: The aortic valve is abnormal. Aortic valve regurgitation is not visualized. There is moderate calcification of the aortic valve. Aortic valve mean gradient measures 7.0 mmHg. Aortic valve peak gradient measures 13.5 mmHg. Aortic valve area,  by VTI measures 1.94 cm. Pulmonic Valve: The pulmonic valve was normal in structure. Pulmonic valve regurgitation is trivial. No evidence of pulmonic stenosis. Aorta: The aortic root is normal in size and structure. Venous: The inferior vena cava is normal in size with greater than 50% respiratory variability, suggesting right atrial pressure of 3 mmHg. IAS/Shunts: No atrial level shunt detected by color flow Doppler.  LEFT VENTRICLE PLAX 2D LVIDd:         4.40 cm      Diastology LVIDs:         3.50 cm      LV e' lateral:    8.85 cm/s LV PW:         1.60 cm      LV E/e' lateral: 8.0 LV IVS:        1.40 cm      LV e' medial:    4.20 cm/s LVOT diam:     2.10 cm      LV E/e' medial:  16.8 LV SV:         68 LV SV Index:   28 LVOT Area:     3.46 cm  LV Volumes (MOD) LV vol d, MOD A2C: 213.0 ml LV vol d, MOD A4C: 169.0 ml LV vol s, MOD A2C: 123.0 ml LV vol s, MOD A4C: 93.2 ml LV SV MOD A2C:     90.0 ml LV SV MOD A4C:     169.0 ml LV SV MOD BP:      85.1 ml RIGHT VENTRICLE            IVC RV S prime:     5.40 cm/s  IVC diam: 1.10 cm TAPSE (M-mode): 0.8 cm LEFT ATRIUM             Index LA diam:        4.10 cm  1.69 cm/m LA Vol (A2C):   57.3 ml 23.58 ml/m LA Vol (A4C):   41.8 ml 17.20 ml/m LA Biplane Vol: 53.6 ml 22.06 ml/m  AORTIC VALVE AV Area (Vmax):    1.71 cm AV Area (Vmean):   1.68 cm AV Area (VTI):     1.94 cm AV Vmax:           184.00 cm/s AV Vmean:          122.000 cm/s AV VTI:            0.351 m AV Peak Grad:      13.5 mmHg AV Mean Grad:      7.0 mmHg LVOT Vmax:         90.80 cm/s LVOT Vmean:        59.000 cm/s LVOT VTI:          0.197 m LVOT/AV VTI ratio: 0.56  AORTA Ao Root diam: 3.70 cm Ao Asc diam:  3.50 cm MITRAL VALVE               TRICUSPID VALVE MV Area (PHT): 3.99 cm    TR Peak grad:   31.8 mmHg MV Peak grad:  9.7 mmHg    TR Vmax:        282.00 cm/s MV Mean grad:  2.0 mmHg MV Vmax:       1.56 m/s    SHUNTS MV Vmean:      58.8 cm/s   Systemic VTI:  0.20 m MV Decel Time: 190 msec    Systemic Diam: 2.10 cm MV E velocity: 70.72 cm/s Cherlynn Kaiser MD Electronically signed by Cherlynn Kaiser MD Signature Date/Time: 10/30/2019/11:12:26 PM    Final     Cardiac Studies   Echo 10/30/19:  1. Left ventricular ejection fraction, by estimation, is 50 to 55%. The  left ventricle has low normal function. Left ventricular endocardial  border not optimally defined to evaluate regional wall motion. There is  moderate left ventricular hypertrophy.  Left ventricular diastolic parameters are indeterminate. Elevated left    ventricular end-diastolic pressure.  2. Right ventricular systolic function is mildly reduced. The right  ventricular size is moderately enlarged. There is moderately elevated  pulmonary artery systolic pressure.  3. The mitral valve is degenerative. Trivial mitral valve regurgitation.  No evidence of mitral stenosis.  4. The aortic valve is abnormal. Aortic valve regurgitation is not  visualized. Aortic valve mean gradient measures 7.0 mmHg. Aortic valve has  moderately reduced cusp mobility, suggesting gradient may be  underestimated, with at least mild aortic valve  stenosis.  5. The inferior vena cava is normal in size with greater than 50%  respiratory variability, suggesting right atrial pressure of 3 mmHg.    Cardiac cath 10/15/18   Prox RCA lesion is 100% stenosed.  SVG graft was visualized by angiography and is normal in caliber.  Prox Cx to Mid Cx lesion is 100% stenosed.  SVG graft was visualized by angiography and is normal in caliber.  Ost LAD to Prox LAD lesion is 100% stenosed.  LIMA graft was visualized by angiography and is normal in caliber.  SVG graft was not injected.  Mid Graft lesion before 2nd Mrg is 99% stenosed.  A drug-eluting stent was successfully placed using a STENT RESOLUTE [id number].  Post intervention, there is a 0% residual stenosis.  1. 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 Chronic occlusion proximal LAD. Patent LIMA to mid LAD. Chronic occlusion of  the vein graft to the Diagonal branch (not injected-known occlusion) 3. Chronic occlusion mid Circumflex. Patent sequential vein graft to OM2/OM3. Severe stenosis mid body of SVG to OM2/OM3 within the old stent 4. Successful PTCA/DES x 1 mid body of SVG to OM2/OM3 5. Chronic occlusion proximal RCA. Patent SVG to distal RCA  Recommendations: ASA and Plavix along with Eliquis for one month. Would stop ASA at one month and continue Plavix along with Eliquis for at  least one year. Continue statin and beta blocker. Resume Eliquis tomorrow.    Patient Profile     74 y.o. male with a hx of coronary artery disease, CABG, hypertension, hyperlipidemia, paroxysmal atrial fibrillation, hypothyroidism, CKD who presented 11/04/2019 with chest pain and was found to have a GI bleedand  elevated troponins, elevated Cr and acute anemia with drop in Hgb from 16 to 8.  Heme+ stools. Eliquis and Plavix held.   Assessment & Plan    1.  Non-STEMI/CAD: Denies anginal pains.Troponin 4646 on morning of 10/30/2019.  Echocardiogram demonstrates low normal LV systolic function, EF 50 to 55%.  Plan is to proceed with cardiac catheterization as soon as he is deemed stable from a GI standpoint.  The plan is for EGD and colonoscopy this upcoming Monday.  Presently on statin and beta-blocker.  2.  Acute GI bleed: Clopidogrel and apixaban on hold.  Plans for EGD and colonoscopy this upcoming Monday.  Most recent hemoglobin 9.7 on 11/04/2019.  3.  Paroxysmal atrial fibrillation: Anticoagulation on hold.  Presently on dronedarone and metoprolol.  4.  Hypertension: BP is normal.  5.  Hyperlipidemia: On rosuvastatin.  6.  Acute kidney injury: Creatinine 1.96 on 10/30/2019.  For questions or updates, please contact [place] Please consult [email or web address] for contact info under        Signed, Kate Sable, MD  10/31/2019, 9:14 AM

## 2019-10-31 NOTE — Progress Notes (Signed)
Patient complained of 6/10 CP.  BP =112/87.  4 L O2 [place] applied.  EKG obtained, hand delivered to Dr Bronson Ing, Verbal order to administer 1 nitro.  Post nitro BP= 100/74.  Patient states that he is now pain free, Dr. Bronson Ing notified.

## 2019-10-31 NOTE — Progress Notes (Signed)
Pts heart rate  130 -140s, c/o chest pain 8/10, nitrostat X2 given, EKG obtained following nitro administration  Patient now pain free. Post Nitro BPs:  Manual BP Right arm 86/66 and on recheck 85/60.  Manual BP Left arm 95/60.

## 2019-11-01 ENCOUNTER — Inpatient Hospital Stay (HOSPITAL_COMMUNITY): Payer: 59

## 2019-11-01 DIAGNOSIS — K921 Melena: Secondary | ICD-10-CM

## 2019-11-01 LAB — CBC WITH DIFFERENTIAL/PLATELET
Abs Immature Granulocytes: 0.06 10*3/uL (ref 0.00–0.07)
Basophils Absolute: 0 10*3/uL (ref 0.0–0.1)
Basophils Relative: 1 %
Eosinophils Absolute: 0.1 10*3/uL (ref 0.0–0.5)
Eosinophils Relative: 2 %
HCT: 27.2 % — ABNORMAL LOW (ref 39.0–52.0)
Hemoglobin: 8.1 g/dL — ABNORMAL LOW (ref 13.0–17.0)
Immature Granulocytes: 1 %
Lymphocytes Relative: 26 %
Lymphs Abs: 1.9 10*3/uL (ref 0.7–4.0)
MCH: 28.4 pg (ref 26.0–34.0)
MCHC: 29.8 g/dL — ABNORMAL LOW (ref 30.0–36.0)
MCV: 95.4 fL (ref 80.0–100.0)
Monocytes Absolute: 0.6 10*3/uL (ref 0.1–1.0)
Monocytes Relative: 8 %
Neutro Abs: 4.6 10*3/uL (ref 1.7–7.7)
Neutrophils Relative %: 62 %
Platelets: 261 10*3/uL (ref 150–400)
RBC: 2.85 MIL/uL — ABNORMAL LOW (ref 4.22–5.81)
RDW: 14.4 % (ref 11.5–15.5)
WBC: 7.3 10*3/uL (ref 4.0–10.5)
nRBC: 0 % (ref 0.0–0.2)

## 2019-11-01 LAB — BASIC METABOLIC PANEL
Anion gap: 6 (ref 5–15)
BUN: 22 mg/dL (ref 8–23)
CO2: 25 mmol/L (ref 22–32)
Calcium: 8.8 mg/dL — ABNORMAL LOW (ref 8.9–10.3)
Chloride: 102 mmol/L (ref 98–111)
Creatinine, Ser: 2.06 mg/dL — ABNORMAL HIGH (ref 0.61–1.24)
GFR calc Af Amer: 35 mL/min — ABNORMAL LOW (ref >60)
GFR calc non Af Amer: 31 mL/min — ABNORMAL LOW (ref >60)
Glucose, Bld: 204 mg/dL — ABNORMAL HIGH (ref 70–99)
Potassium: 4.5 mmol/L (ref 3.5–5.1)
Sodium: 133 mmol/L — ABNORMAL LOW (ref 135–145)

## 2019-11-01 LAB — GLUCOSE, CAPILLARY
Glucose-Capillary: 151 mg/dL — ABNORMAL HIGH (ref 70–99)
Glucose-Capillary: 248 mg/dL — ABNORMAL HIGH (ref 70–99)
Glucose-Capillary: 269 mg/dL — ABNORMAL HIGH (ref 70–99)
Glucose-Capillary: 164 mg/dL — ABNORMAL HIGH (ref 70–99)

## 2019-11-01 LAB — HEMOGLOBIN AND HEMATOCRIT, BLOOD
HCT: 31.5 % — ABNORMAL LOW (ref 39.0–52.0)
Hemoglobin: 9.6 g/dL — ABNORMAL LOW (ref 13.0–17.0)

## 2019-11-01 LAB — PREPARE RBC (CROSSMATCH)

## 2019-11-01 LAB — TROPONIN I (HIGH SENSITIVITY)
Troponin I (High Sensitivity): 1042 ng/L (ref <18)
Troponin I (High Sensitivity): 882 ng/L (ref <18)

## 2019-11-01 MED ORDER — NITROGLYCERIN 2 % TD OINT
0.5000 [in_us] | TOPICAL_OINTMENT | Freq: Four times a day (QID) | TRANSDERMAL | Status: DC
  Administered 2019-11-01: 0.5 [in_us] via TOPICAL
  Filled 2019-11-01: qty 30

## 2019-11-01 MED ORDER — NITROGLYCERIN IN D5W 200-5 MCG/ML-% IV SOLN
0.0000 ug/min | INTRAVENOUS | Status: DC
  Administered 2019-11-01: 5 ug/min via INTRAVENOUS
  Filled 2019-11-01: qty 250

## 2019-11-01 MED ORDER — PEG-KCL-NACL-NASULF-NA ASC-C 100 G PO SOLR
0.5000 | Freq: Once | ORAL | Status: AC
  Administered 2019-11-02: 100 g via ORAL

## 2019-11-01 MED ORDER — MORPHINE SULFATE (PF) 2 MG/ML IV SOLN
1.0000 mg | INTRAVENOUS | Status: DC | PRN
  Administered 2019-11-02 – 2019-11-03 (×2): 1 mg via INTRAVENOUS
  Filled 2019-11-01 (×2): qty 1

## 2019-11-01 MED ORDER — PEG-KCL-NACL-NASULF-NA ASC-C 100 G PO SOLR: 1.0000 | Freq: Once | ORAL | Status: DC

## 2019-11-01 MED ORDER — PEG-KCL-NACL-NASULF-NA ASC-C 100 G PO SOLR
0.5000 | Freq: Once | ORAL | Status: AC
  Administered 2019-11-01: 100 g via ORAL
  Filled 2019-11-01: qty 1

## 2019-11-01 MED ORDER — SODIUM CHLORIDE 0.9% IV SOLUTION: Freq: Once | INTRAVENOUS | Status: DC

## 2019-11-01 MED ORDER — METOPROLOL TARTRATE 5 MG/5ML IV SOLN
5.0000 mg | Freq: Once | INTRAVENOUS | Status: AC
  Administered 2019-11-01: 5 mg via INTRAVENOUS
  Filled 2019-11-01: qty 5

## 2019-11-01 MED ORDER — ESMOLOL HCL-SODIUM CHLORIDE 2000 MG/100ML IV SOLN
25.0000 ug/kg/min | INTRAVENOUS | Status: DC
  Filled 2019-11-01: qty 100

## 2019-11-01 MED ORDER — MORPHINE SULFATE (PF) 2 MG/ML IV SOLN
0.5000 mg | Freq: Once | INTRAVENOUS | Status: AC
  Administered 2019-11-01: 0.5 mg via INTRAVENOUS
  Filled 2019-11-01: qty 1

## 2019-11-01 NOTE — Progress Notes (Signed)
Patient continues to complain of 10/10 chest pain.  0.5 mg morphine given. No relief.  EKG obtained PA paged x2, PA at bedside Nitro gtt ordered.

## 2019-11-01 NOTE — Progress Notes (Signed)
10/10 chest pain, 1 nitrostat given. BP 135/97 After nirto BP 103/59 EKG obtained, placed on 4L O2 [place] PA paged

## 2019-11-01 NOTE — Progress Notes (Signed)
BP 104/73 30 min pre-transfusion BP 96/67 15 min post- blood start No complaints of back pain, SOB, chills.  MD notified. Continue to monitor. Rechecked BP, 106/67

## 2019-11-01 NOTE — Progress Notes (Signed)
No orders for amiodarone. On call MD [place] paged.  HR high 80's-90's. Per MD, okay to not order amio

## 2019-11-01 NOTE — Progress Notes (Signed)
   Notified by RN that patient continues to have 10/10 chest pain despite nitro paste and morphine. He is s/p 1 uPRBC for symptomatic anemia. BP has been soft in the 100s/70s. He received IV metoprolol and HR remains in the 100s. He reports pain has been nearly constant all day today. Worse when getting out of bed. Some SOB, otherwise no associated symptoms. HsTrop this AM has continued to downtrend from peak 4646 10/20/2019 to [id number]>696 today. Reassuring that he is not having a recurrent event. Cardiac exam is benign and perhaps some decreased breath sounds at lung bases.  - Will check a CXR - could consider lasix if evidence of fluid - Will give an amiodarone bolus to help slow HR - Will cautiously start a nitro gtt with plans to hold for SBP <90 - Will continue to hold off on heparin gtt given downtrending trops and further drop in Hgb today  Difficult situation given GIB. Needs an EGD/C-scope but also needs a cath. Will continue to monitor closely to determine next steps. Discussed with Dr. Bronson Ing who is in agreement with the plan.  Abigail Butts, PA-C 11/01/19; 2:12 PM

## 2019-11-01 NOTE — Progress Notes (Addendum)
Progress Note  CC:          ASSESSMENT AND PLAN:   FOBT on Plavix and Eliquis /  Normocytic anemia  --Plavix and Eliquis on hold --Plan is for EGD / colonoscopy tomorrow.  The risks and benefits of EGD and colonoscopy with possible polypectomy / biopsies were discussed and the patient agrees to proceed.  --Having bad chest pain this am. Hgb drifting again, down from 9.3 >> 8.4 >> 8.1. More blood was ordered ( may help with demand / chest pain?)  # Non-STEMI / CAD  --Cardiology planning for cardiac cath following completion on anemia / FOBT+ evaluation  # Remote hx of UC. No active disease on colonoscopies over last several years  # NASH. Fibrosis score F3/F4. No radiographic evidence of cirrhosis.     SUBJECTIVE    Having bad chest pain at rest this am. Cardiology in room   OBJECTIVE:     Vital signs in last 24 hours: Temp:  [97.7 F (36.5 C)-97.9 F (36.6 C)] 97.7 F (36.5 C) (05/23 0434) Pulse Rate:  [89-114] 114 (05/23 0950) Resp:  [16-20] 20 (05/23 0434) BP: (95-126)/(59-88) 112/65 (05/23 0950) SpO2:  [95 %-100 %] 100 % (05/23 0434) Weight:  [124.3 kg] 124.3 kg (05/23 0434) Last BM Date: 10/30/19 General:   Alert, in NAD Heart:  Regular rate and rhythm.  No lower extremity edema   Pulm: Normal respiratory effort   Abdomen:  Soft,  nontender, protuberant..  Normal bowel sounds.          Neurologic:  Alert and  oriented,  grossly normal neurologically. Psych:  Pleasant, cooperative.  Normal mood and affect.   Intake/Output from previous day: 05/22 0701 - 05/23 0700 In: 180 [P.O.:180] Out: -  Intake/Output this shift: No intake/output data recorded.  Lab Results: Recent Labs    10/30/19 0400 10/31/19 1030 11/01/19 0326  WBC  --  8.5 7.3  HGB 9.3* 8.4* 8.1*  HCT 30.7* 27.9* 27.2*  PLT  --  277 261   BMET Recent Labs    10/30/19 0400 10/31/19 1030 11/01/19 0326  NA 137 139 133*  K 4.3 5.1 4.5  CL 101 103 102  CO2 27 26 25   GLUCOSE  185* 218* 204*  BUN 19 19 22   CREATININE 1.96* 2.08* 2.06*  CALCIUM 9.0 9.0 8.8*   LFT No results for input(s): PROT, ALBUMIN, AST, ALT, ALKPHOS, BILITOT, BILIDIR, IBILI in the last 72 hours. PT/INR No results for input(s): LABPROT, INR in the last 72 hours. Hepatitis Panel No results for input(s): HEPBSAG, HCVAB, HEPAIGM, HEPBIGM in the last 72 hours.  ECHOCARDIOGRAM COMPLETE  Result Date: 10/30/2019    ECHOCARDIOGRAM REPORT   Patient Name:   Matthew [name] Date of Exam: 10/30/2019 Medical Rec #:  [id number]          Height:       72.0 in Accession #:    [id number]         Weight:       272.6 lb Date of Birth:  09-11-1945          BSA:          2.430 m Patient Age:    74 years           BP:           109/73 mmHg Patient Gender: M                  HR:  72 bpm. Exam Location:  Inpatient Procedure: 2D Echo, Cardiac Doppler and Color Doppler Indications:    I50.40* Unspecified combined systolic (congestive) and diastolic                 (congestive) heart failure  History:        Patient has prior history of Echocardiogram examinations, most                 recent 10/11/2016. CAD and Previous Myocardial Infarction,                 Abnormal ECG and Prior CABG, Arrythmias:Atrial Fibrillation and                 RBBB, Signs/Symptoms:Chest Pain; Risk Factors:Diabetes,                 Dyslipidemia and Hypertension.  Sonographer:    Roseanna Rainbow RDCS Referring Phys: [id number] RONDELL A SMITH  Sonographer Comments: Technically difficult study due to poor echo windows. Image acquisition challenging due to patient body habitus. IMPRESSIONS  1. Left ventricular ejection fraction, by estimation, is 50 to 55%. The left ventricle has low normal function. Left ventricular endocardial border not optimally defined to evaluate regional wall motion. There is moderate left ventricular hypertrophy. Left ventricular diastolic parameters are indeterminate. Elevated left ventricular end-diastolic pressure.  2. Right  ventricular systolic function is mildly reduced. The right ventricular size is moderately enlarged. There is moderately elevated pulmonary artery systolic pressure.  3. The mitral valve is degenerative. Trivial mitral valve regurgitation. No evidence of mitral stenosis.  4. The aortic valve is abnormal. Aortic valve regurgitation is not visualized. Aortic valve mean gradient measures 7.0 mmHg. Aortic valve has moderately reduced cusp mobility, suggesting gradient may be underestimated, with at least mild aortic valve stenosis.  5. The inferior vena cava is normal in size with greater than 50% respiratory variability, suggesting right atrial pressure of 3 mmHg. FINDINGS  Left Ventricle: Left ventricular ejection fraction, by estimation, is 50 to 55%. The left ventricle has low normal function. Left ventricular endocardial border not optimally defined to evaluate regional wall motion. The left ventricular internal cavity  size was normal in size. There is moderate left ventricular hypertrophy. Left ventricular diastolic parameters are indeterminate. Elevated left ventricular end-diastolic pressure. Right Ventricle: The right ventricular size is moderately enlarged. No increase in right ventricular wall thickness. Right ventricular systolic function is mildly reduced. There is moderately elevated pulmonary artery systolic pressure. The tricuspid regurgitant velocity is 2.82 m/s, and with an assumed right atrial pressure of 15 mmHg, the estimated right ventricular systolic pressure is 99.8 mmHg. Left Atrium: Left atrial size was normal in size. Right Atrium: Right atrial size was normal in size. Pericardium: There is no evidence of pericardial effusion. Mitral Valve: The mitral valve is degenerative in appearance. Normal mobility of the mitral valve leaflets. Mild to moderate mitral annular calcification. Trivial mitral valve regurgitation. No evidence of mitral valve stenosis. MV peak gradient, 9.7 mmHg. The mean  mitral valve gradient is 2.0 mmHg. Tricuspid Valve: The tricuspid valve is normal in structure. Tricuspid valve regurgitation is mild . No evidence of tricuspid stenosis. Aortic Valve: The aortic valve is abnormal. Aortic valve regurgitation is not visualized. There is moderate calcification of the aortic valve. Aortic valve mean gradient measures 7.0 mmHg. Aortic valve peak gradient measures 13.5 mmHg. Aortic valve area,  by VTI measures 1.94 cm. Pulmonic Valve: The pulmonic valve was normal in structure. Pulmonic valve regurgitation is  trivial. No evidence of pulmonic stenosis. Aorta: The aortic root is normal in size and structure. Venous: The inferior vena cava is normal in size with greater than 50% respiratory variability, suggesting right atrial pressure of 3 mmHg. IAS/Shunts: No atrial level shunt detected by color flow Doppler.  LEFT VENTRICLE PLAX 2D LVIDd:         4.40 cm      Diastology LVIDs:         3.50 cm      LV e' lateral:   8.85 cm/s LV PW:         1.60 cm      LV E/e' lateral: 8.0 LV IVS:        1.40 cm      LV e' medial:    4.20 cm/s LVOT diam:     2.10 cm      LV E/e' medial:  16.8 LV SV:         68 LV SV Index:   28 LVOT Area:     3.46 cm  LV Volumes (MOD) LV vol d, MOD A2C: 213.0 ml LV vol d, MOD A4C: 169.0 ml LV vol s, MOD A2C: 123.0 ml LV vol s, MOD A4C: 93.2 ml LV SV MOD A2C:     90.0 ml LV SV MOD A4C:     169.0 ml LV SV MOD BP:      85.1 ml RIGHT VENTRICLE            IVC RV S prime:     5.40 cm/s  IVC diam: 1.10 cm TAPSE (M-mode): 0.8 cm LEFT ATRIUM             Index LA diam:        4.10 cm 1.69 cm/m LA Vol (A2C):   57.3 ml 23.58 ml/m LA Vol (A4C):   41.8 ml 17.20 ml/m LA Biplane Vol: 53.6 ml 22.06 ml/m  AORTIC VALVE AV Area (Vmax):    1.71 cm AV Area (Vmean):   1.68 cm AV Area (VTI):     1.94 cm AV Vmax:           184.00 cm/s AV Vmean:          122.000 cm/s AV VTI:            0.351 m AV Peak Grad:      13.5 mmHg AV Mean Grad:      7.0 mmHg LVOT Vmax:         90.80 cm/s LVOT  Vmean:        59.000 cm/s LVOT VTI:          0.197 m LVOT/AV VTI ratio: 0.56  AORTA Ao Root diam: 3.70 cm Ao Asc diam:  3.50 cm MITRAL VALVE               TRICUSPID VALVE MV Area (PHT): 3.99 cm    TR Peak grad:   31.8 mmHg MV Peak grad:  9.7 mmHg    TR Vmax:        282.00 cm/s MV Mean grad:  2.0 mmHg MV Vmax:       1.56 m/s    SHUNTS MV Vmean:      58.8 cm/s   Systemic VTI:  0.20 m MV Decel Time: 190 msec    Systemic Diam: 2.10 cm MV E velocity: 70.72 cm/s Cherlynn Kaiser MD Electronically signed by Cherlynn Kaiser MD Signature Date/Time: 10/30/2019/11:12:26 PM    Final  LOS: 3 days   Tye Savoy ,NP 11/01/2019, 10:27 AM   ________________________________________________________________________  [facility] Heckler GI MD note:  I personally examined the patient, reviewed the data and agree with the assessment and plan described above.  He presented with chest pain several days ago, found to haveFOBT positive anemia several days ago on plavix/eliquis. Not overtly bleeding.  We are planning colonsocopy and EGD tomorrow.  I spoke with cardiology just now and agree to continue with those plans for now, understanding that he's uncomfortable with chest pains but without new EKG changes and his troponin continue to downtrend.  He is getting one unit blood to see if that helps his symptoms.   Owens Loffler, MD [facility] Pager [phone]

## 2019-11-01 NOTE — Progress Notes (Addendum)
PROGRESS NOTE  Matthew [name] XBL:[id number] DOB: April 24, 1946 DOA: 10/31/2019 PCP: Marda Stalker, PA-C  Brief History    Matthew [name] is a 74 y.o. male with medical history significant of HTN, HLD, PAF on Eliquis, CAD s/p CABGx5v and stents, AAA, hypothyroidism, CKD stage III, chronic pain, and GERD who presented with complaints of chest pain.  Patient had been watching the basketball game last night and never really got to sleep with he had acute onset of left-sided chest pain.  He described it as dull pain with radiation down his arm.  With the chest pain he denied having any diaphoresis, nausea, vomiting, or shortness of breath.  Reported having similar symptoms 3 days ago with diaphoresis and nausea, but resolved with nitroglycerin.  Last night when he attempted to take a nitroglycerin he had no relief in symptoms.  Denies any recent change in weight, leg swelling, abdominal pain in, or diarrhea.  Other associated symptoms include chronic shortness of breath unchanged, chronic constipation for which he is on stool softeners, and reported seeing some bright red blood in his stools approximately 3 days ago.  He is not on any NSAIDs, but is on Eliquis and Plavix.  Patient had a left heart cath by Dr. Angelena Form in 10/2018 which showed severe triple-vessel coronary artery disease status post 5 vessel CABG with 4/5 patent grafts with successful DES to mid body of SVG to OM2/OM3.  Patient had prior colonoscopy in 2015 noted 5 sessile polyps which were removed.  Endoscopy from 2016 and multiple gastric polyps and Barrett's esophagus.   Patient was given 324 mg of aspirin in route with EMS   ED Course: Upon admission into the emergency department patient was seen to be afebrile with heart rates elevated up to 154, and all other vital signs maintained.  Labs significant for hemoglobin 8.8, BUN 22, creatinine 2.13, glucose 243, and high-sensitivity troponin 61 which has increased to 98 and then 4646 .   Stool guaiacs were noted to be positive, but not reported to have gross blood on rectal exam.  Chest x-ray showed no acute abnormalities.  Patient was given Tylenol and fentanyl.  TRH called to admit.  The patient has been admitted to a telemetry bed. Cardiology and gastroenterology  have been consulted. Echocardiogram pending. Plavix and Eliquis have been held for now. Evaluation for possible source of GI bleed will need to be completed before cardiology can entertain LHC which will be necessary. Gastroenterology will perform EGD and colonoscopy planned for Monday when plavix has had a chance to wash out. We will monitor Hgb and transfuse for Hgb greater than 8.0.  The patient has been having complaints of chest pain this morning. SL NTG dropped his pressure abruptly. The patient has been evaluated by cardiology. He was given IV metoprolol, SL nitro, and a GI cocktail without improvement He has been placed on nitropaste and an esmolol drip. I have given the patient morphine to address his discomfort. GI cocktail was not helpful. EKG demonstrated AF with RVR. Morphine given for pain. Cardiology has ordered transfusion as Hgb is 8.1 this morning. This correlated with an elevated troponin at 1042 which has trended down to 882. Cardidology is monitoring carefully. The patient needs LHC, but must have EGC, CSPY first. Cardiology has given the patient IV amiodarone to control rate and placed the patient on a nitro drip. They have ordered CXR to evaluate the possibility of volume overload.   Consultants  Gastroenterology Cardiology  Procedures  None  Antibiotics   Anti-infectives (From admission, onward)    None      Subjective  The patient is complaining of chest pain that has been intermittent and moderate to severe for 4-5 hours.   Objective   Vitals:  Vitals:   11/01/19 1220 11/01/19 1240  BP: 96/67 100/76  Pulse:  95  Resp:  19  Temp:  98.1 F (36.7 C)  SpO2:  100%    Exam:  Constitutional:  The patient is awake, alert, and oriented x 3. Moderate pain from chest discomfort. Respiratory:  No increased work of breathing. No wheezes, rales, or rhonchi No tactile fremitus Cardiovascular:  Regular rate and rhythm No murmurs, ectopy, or gallups. No lateral PMI. No thrills. Abdomen:  Abdomen is soft, non-tender, non-distended No hernias, masses, or organomegaly Normoactive bowel sounds.  Musculoskeletal:  No cyanosis, clubbing, or edema Skin:  No rashes, lesions, ulcers palpation of skin: no induration or nodules Neurologic:  CN 2-12 intact Sensation all 4 extremities intact Psychiatric:  Mental status Mood, affect appropriate Orientation to person, place, time  judgment and insight appear intact  I have personally reviewed the following:   Today's Data  Vitals, BMP, CBC  Cardiology Data  Echocardiogram: Atrial fibrillation with RVR  Scheduled Meds:  sodium chloride   Intravenous Once   buPROPion  300 mg Oral Daily   dronedarone  400 mg Oral BID WC   ezetimibe  5 mg Oral QHS   ferrous sulfate  325 mg Oral Q M,W,F   finasteride  5 mg Oral QHS   gabapentin  300 mg Oral BID   insulin aspart  0-15 Units Subcutaneous TID WC   insulin aspart  0-5 Units Subcutaneous QHS   insulin detemir  25 Units Subcutaneous BID   levothyroxine  150 mcg Oral QAC breakfast   magnesium oxide  400 mg Oral BID   melatonin  3 mg Oral QHS   metoprolol succinate  12.5 mg Oral Daily   midodrine  2.5 mg Oral TID WC   nitroGLYCERIN  0.5 inch Topical Q6H   nortriptyline  50 mg Oral QHS   peg 3350 powder  0.5 kit Oral Once   And   [START ON 11/01/2019] peg 3350 powder  0.5 kit Oral Once   rosuvastatin  40 mg Oral QHS   senna  2 tablet Oral q AM   And   senna  1 tablet Oral QHS   sertraline  25 mg Oral BH-q7a   tamsulosin  0.4 mg Oral Daily   traZODone  100 mg Oral QHS   Continuous Infusions:   Principal Problem:   Chest pain Active Problems:    Hypothyroidism   PAF (paroxysmal atrial fibrillation) (HCC)   CKD (chronic kidney disease), stage III   AAA (abdominal aortic aneurysm) (HCC)   Diabetic polyneuropathy associated with type 2 diabetes mellitus (HCC)   OSA (obstructive sleep apnea)   Prolonged QT interval   Depression   GI bleed   Acute blood loss anemia   Hyperlipidemia LDL goal <70   AKI (acute kidney injury) ([place])   Blood in stool   LOS: 3 days   A & P  Chest pain/Elevated troponin/ Known CAD: Acute on chronic.  Patient presented with complaints of chest pain which woke him out of sleep.  Not relieved with nitroglycerin.  Troponin 61->98. Chest x-ray was noted to be clear.  Patient's last heart catheter from 10/2018 with severe triple vessel coronary artery disease 4/5 patent grafts and had  drug-eluting stent placed at the mid body of SVG to OM2/OM3.  Suspect secondary to demand in the setting of acute blood loss. The patient has been admitred to a progressive bed. Troponins have been trended down to 13. Plavix and Eliquis have been held pending GI eval. LHC is anticipated if patient can be cleared by GI after EGD and colonoscopy. Echocardiogram has been performed. It demonstrated EF of 50-55%. There is moderate LVH. Regional wall motion is not well defined. RV systolic function is mildly reduced. RV is moderately enlarged. There is moderately elevated pulmonary artery systolic pressure. Chest pain this morning. Pt is receiving morphine, nitropaste, and IV esmolol. EKG demonstrates AF with RVR. No response to GI cocktail. He is receiving 1 unit of PRBC's in transfusion.  GI bleed/Acute blood loss anemia: Acute.  Patient's baseline hemoglobin previously noted to be 15.1 on 08/22/19, but patient presents with hemoglobin down to 8.8.  Stool guaiacs were noted to be positive. Review of records note previous history of gastric as well as sessile polyps on previous EGD and colonoscopy from 2016 and 2015 respectively.  Patient's Eliquis  and plavix have been held. Cardiology is aware. Clear liquid diet as tolerated and he has been advanced to a carb modified diet. Hemoglobin will be monitored Q8hours and the patient will be transfused for a hemoglobin of less than 8.0 per cardilogy's recommendation. I appreciate GI consultative services. Hgb is 8.1 this am. He is receiving 1 unit of PRBC's in transfusion. The chest pain correlated with an elevated troponin at 1042 which has trended down to 882. Cardidology is monitoring carefully. The patient needs LHC, but must have EGC, CSPY first. Cardiology has given the patient IV amiodarone to control rate and placed the patient on a nitro drip. They have ordered CXR to evaluate the possibility of volume overload.    Paroxysmal atrial fibrillation on chronic anticoagulation: Patient sinus tachycardia with heart rates into the 120s.  CHA2DS2-VASc score = >2. Home medications include Eliquis, dronedarone 400 mg twice daily, and metoprolol 12.5 mg daily. Hold Eliquis due to acute blood loss anemia suspected secondary to GI bleed. Continue metoprolol and dronedarone. Cardiology has initiated an esmolol drip and amiodarone to help control rate.   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 Last hemoglobin A1c noted to be 7.6 on 08/22/2019.  Home medications include Levemir 50 units twice daily. The patient is on a carbohydrate modified diet and a hypoglycemic protocol. He has been continued on gabapentin and glucoses will be followed with FSBS and SSI. He will be continued on his home dose of lantus, 50 units. Monitior. Glucoses today ran from 151-253.Marland Kitchen   Prolonged QT interval: QTC elevated up to 532 on admission. Correct any electrolyte abnormalities. Continue to monitor QT interval.   AAA: 3 x 3.2 cm aneurysm of the distal abdominal aorta on CT in 10/2018. Likely needs follow-up surveillance sometime this month.   Hypothyroidism: Continue levothyroxine   Depression: Home medications include  Zoloft 25 mg every morning, Wellbutrin 300 mg daily, nortriptyline 25 mg nightly, and trazodone 100 mg nightly. These have been continued due to the patient's explicit request.   Chronic kidney disease stage IIIb: Creatinine 1.96 which appears near patient's baseline.   Mild memory loss: Patient just recently started on Namenda.   Hyperlipidemia: Continue Zetia, fenofibrate, and Crestor   GERD: Hold PPI due to prolonged QT interval   OSA on CPAP: Continue CPAP per RT  I have seen and examined this patient myself. I have  spent 40 minutes in his evaluation and care.   DVT prophylaxis: SCDs Code Status: Full  Family Communication: Wife updated over the phone  Disposition Plan: The patient is from home. Anticipate discharge to home. Barriers to discharge: Plan for EGD and Colonoscopy on 10/11/2019. Plan for [facility] by cardiology when cleared by GI. Need to monitor hemoglobin and telemetry closely. Prognosis is guarded.  Sanayah Munro, DO [facility] Direct contact: see [email or web address]  7PM-7AM contact night coverage as above 11/01/2019, 3:53 PM  LOS: 1 day

## 2019-11-01 NOTE — Progress Notes (Addendum)
Progress Note  Patient Name: Matthew [name] Date of Encounter: 11/01/2019  Primary Cardiologist: Larae Grooms, MD   Subjective   He has been having episodic chest pain alleviated with sublingual nitroglycerin.  Blood pressures have been in the low normal range but up to 130/86 when I evaluated him.  Nitropaste applied this morning and also given SL nitro.  He has been tachycardic.  Awaiting EGD and colonoscopy on Monday.  Hemoglobin 8.1 today.  Inpatient Medications    Scheduled Meds: . sodium chloride   Intravenous Once  . buPROPion  300 mg Oral Daily  . dronedarone  400 mg Oral BID WC  . ezetimibe  5 mg Oral QHS  . ferrous sulfate  325 mg Oral Q M,W,F  . finasteride  5 mg Oral QHS  . gabapentin  300 mg Oral BID  . insulin aspart  0-15 Units Subcutaneous TID WC  . insulin aspart  0-5 Units Subcutaneous QHS  . insulin detemir  25 Units Subcutaneous BID  . levothyroxine  150 mcg Oral QAC breakfast  . magnesium oxide  400 mg Oral BID  . melatonin  3 mg Oral QHS  . metoprolol succinate  12.5 mg Oral Daily  . midodrine  2.5 mg Oral TID WC  . nitroGLYCERIN  0.5 inch Topical Q6H  . nortriptyline  50 mg Oral QHS  . peg 3350 powder  1 kit Oral Once  . rosuvastatin  40 mg Oral QHS  . senna  2 tablet Oral q AM   And  . senna  1 tablet Oral QHS  . sertraline  25 mg Oral BH-q7a  . tamsulosin  0.4 mg Oral Daily  . traZODone  100 mg Oral QHS   Continuous Infusions:  PRN Meds: acetaminophen, alum & mag hydroxide-simeth, lidocaine, nitroGLYCERIN, oxyCODONE-acetaminophen **AND** oxyCODONE, polyethylene glycol, triamcinolone cream   Vital Signs    Vitals:   10/31/19 2033 11/01/19 0434 11/01/19 0900 11/01/19 0950  BP:  (!) 99/59 101/63 112/65  Pulse: 89 89  (!) 114  Resp: 16 20    Temp: 97.9 F (36.6 C) 97.7 F (36.5 C)    TempSrc: Oral Oral    SpO2: 100% 100%    Weight:  124.3 kg    Height:       No intake or output data in the 24 hours ending 11/01/19 1035 Last  3 Weights 11/01/2019 10/31/2019 10/30/2019  Weight (lbs) 274 lb 1.6 oz 273 lb 2.4 oz 272 lb 9.6 oz  Weight (kg) 124.331 kg 123.9 kg 123.651 kg      Telemetry      ECG    Atrial fibrillation, 99 bpm, right bundle branch block, left axis deviation, old inferior infarct- Personally Reviewed  Physical Exam   GEN: Appears somewhat uncomfortable, no acute distress.   Neck: No JVD Cardiac: Tachycardic, irregular, no murmurs, rubs, or gallops.  Respiratory: Clear to auscultation bilaterally. GI: Soft, nontender, non-distended  MS: No edema; No deformity. Neuro:  Nonfocal  Psych: Normal affect   Labs    High Sensitivity Troponin:   Recent Labs  Lab 10/16/2019 0453 10/23/2019 0725 10/22/2019 1344 10/13/2019 1702  TROPONINIHS 61* 98* 2,635* 4,646*      Chemistry Recent Labs  Lab 10/30/19 0400 10/31/19 1030 11/01/19 0326  NA 137 139 133*  K 4.3 5.1 4.5  CL 101 103 102  CO2 27 26 25   GLUCOSE 185* 218* 204*  BUN 19 19 22   CREATININE 1.96* 2.08* 2.06*  CALCIUM 9.0 9.0 8.8*  GFRNONAA 32* 30* 31*  GFRAA 38* 35* 35*  ANIONGAP 9 10 6      Hematology Recent Labs  Lab 11/09/2019 0453 10/19/2019 0725 10/30/19 0400 10/31/19 1030 11/01/19 0326  WBC 9.0  --   --  8.5 7.3  RBC 3.03*  --  3.15* 2.87* 2.85*  HGB 8.8*   < > 9.3* 8.4* 8.1*  HCT 29.7*   < > 30.7* 27.9* 27.2*  MCV 98.0  --   --  97.2 95.4  MCH 29.0  --   --  29.3 28.4  MCHC 29.6*  --   --  30.1 29.8*  RDW 14.3  --   --  14.5 14.4  PLT 347  --   --  277 261   < > = values in this interval not displayed.    BNPNo results for input(s): BNP, PROBNP in the last 168 hours.   DDimer No results for input(s): DDIMER in the last 168 hours.   Radiology    ECHOCARDIOGRAM COMPLETE  Result Date: 10/30/2019    ECHOCARDIOGRAM REPORT   Patient Name:   Matthew [name] Date of Exam: 10/30/2019 Medical Rec #:  [id number]          Height:       72.0 in Accession #:    [id number]         Weight:       272.6 lb Date of Birth:  [birth year]           BSA:          2.430 m Patient Age:    71 years           BP:           109/73 mmHg Patient Gender: M                  HR:           72 bpm. Exam Location:  Inpatient Procedure: 2D Echo, Cardiac Doppler and Color Doppler Indications:    I50.40* Unspecified combined systolic (congestive) and diastolic                 (congestive) heart failure  History:        Patient has prior history of Echocardiogram examinations, most                 recent 10/11/2016. CAD and Previous Myocardial Infarction,                 Abnormal ECG and Prior CABG, Arrythmias:Atrial Fibrillation and                 RBBB, Signs/Symptoms:Chest Pain; Risk Factors:Diabetes,                 Dyslipidemia and Hypertension.  Sonographer:    Roseanna Rainbow RDCS Referring Phys: [id number] RONDELL A SMITH  Sonographer Comments: Technically difficult study due to poor echo windows. Image acquisition challenging due to patient body habitus. IMPRESSIONS  1. Left ventricular ejection fraction, by estimation, is 50 to 55%. The left ventricle has low normal function. Left ventricular endocardial border not optimally defined to evaluate regional wall motion. There is moderate left ventricular hypertrophy. Left ventricular diastolic parameters are indeterminate. Elevated left ventricular end-diastolic pressure.  2. Right ventricular systolic function is mildly reduced. The right ventricular size is moderately enlarged. There is moderately elevated pulmonary artery systolic pressure.  3. The mitral valve is degenerative. Trivial mitral valve regurgitation. No evidence of mitral stenosis.  4. The aortic valve is abnormal. Aortic valve regurgitation is not visualized. Aortic valve mean gradient measures 7.0 mmHg. Aortic valve has moderately reduced cusp mobility, suggesting gradient may be underestimated, with at least mild aortic valve stenosis.  5. The inferior vena cava is normal in size with greater than 50% respiratory variability, suggesting right atrial  pressure of 3 mmHg. FINDINGS  Left Ventricle: Left ventricular ejection fraction, by estimation, is 50 to 55%. The left ventricle has low normal function. Left ventricular endocardial border not optimally defined to evaluate regional wall motion. The left ventricular internal cavity  size was normal in size. There is moderate left ventricular hypertrophy. Left ventricular diastolic parameters are indeterminate. Elevated left ventricular end-diastolic pressure. Right Ventricle: The right ventricular size is moderately enlarged. No increase in right ventricular wall thickness. Right ventricular systolic function is mildly reduced. There is moderately elevated pulmonary artery systolic pressure. The tricuspid regurgitant velocity is 2.82 m/s, and with an assumed right atrial pressure of 15 mmHg, the estimated right ventricular systolic pressure is 49.7 mmHg. Left Atrium: Left atrial size was normal in size. Right Atrium: Right atrial size was normal in size. Pericardium: There is no evidence of pericardial effusion. Mitral Valve: The mitral valve is degenerative in appearance. Normal mobility of the mitral valve leaflets. Mild to moderate mitral annular calcification. Trivial mitral valve regurgitation. No evidence of mitral valve stenosis. MV peak gradient, 9.7 mmHg. The mean mitral valve gradient is 2.0 mmHg. Tricuspid Valve: The tricuspid valve is normal in structure. Tricuspid valve regurgitation is mild . No evidence of tricuspid stenosis. Aortic Valve: The aortic valve is abnormal. Aortic valve regurgitation is not visualized. There is moderate calcification of the aortic valve. Aortic valve mean gradient measures 7.0 mmHg. Aortic valve peak gradient measures 13.5 mmHg. Aortic valve area,  by VTI measures 1.94 cm. Pulmonic Valve: The pulmonic valve was normal in structure. Pulmonic valve regurgitation is trivial. No evidence of pulmonic stenosis. Aorta: The aortic root is normal in size and structure. Venous:  The inferior vena cava is normal in size with greater than 50% respiratory variability, suggesting right atrial pressure of 3 mmHg. IAS/Shunts: No atrial level shunt detected by color flow Doppler.  LEFT VENTRICLE PLAX 2D LVIDd:         4.40 cm      Diastology LVIDs:         3.50 cm      LV e' lateral:   8.85 cm/s LV PW:         1.60 cm      LV E/e' lateral: 8.0 LV IVS:        1.40 cm      LV e' medial:    4.20 cm/s LVOT diam:     2.10 cm      LV E/e' medial:  16.8 LV SV:         68 LV SV Index:   28 LVOT Area:     3.46 cm  LV Volumes (MOD) LV vol d, MOD A2C: 213.0 ml LV vol d, MOD A4C: 169.0 ml LV vol s, MOD A2C: 123.0 ml LV vol s, MOD A4C: 93.2 ml LV SV MOD A2C:     90.0 ml LV SV MOD A4C:     169.0 ml LV SV MOD BP:      85.1 ml RIGHT VENTRICLE            IVC RV S prime:     5.40 cm/s  IVC diam: 1.10  cm TAPSE (M-mode): 0.8 cm LEFT ATRIUM             Index LA diam:        4.10 cm 1.69 cm/m LA Vol (A2C):   57.3 ml 23.58 ml/m LA Vol (A4C):   41.8 ml 17.20 ml/m LA Biplane Vol: 53.6 ml 22.06 ml/m  AORTIC VALVE AV Area (Vmax):    1.71 cm AV Area (Vmean):   1.68 cm AV Area (VTI):     1.94 cm AV Vmax:           184.00 cm/s AV Vmean:          122.000 cm/s AV VTI:            0.351 m AV Peak Grad:      13.5 mmHg AV Mean Grad:      7.0 mmHg LVOT Vmax:         90.80 cm/s LVOT Vmean:        59.000 cm/s LVOT VTI:          0.197 m LVOT/AV VTI ratio: 0.56  AORTA Ao Root diam: 3.70 cm Ao Asc diam:  3.50 cm MITRAL VALVE               TRICUSPID VALVE MV Area (PHT): 3.99 cm    TR Peak grad:   31.8 mmHg MV Peak grad:  9.7 mmHg    TR Vmax:        282.00 cm/s MV Mean grad:  2.0 mmHg MV Vmax:       1.56 m/s    SHUNTS MV Vmean:      58.8 cm/s   Systemic VTI:  0.20 m MV Decel Time: 190 msec    Systemic Diam: 2.10 cm MV E velocity: 70.72 cm/s Cherlynn Kaiser MD Electronically signed by Cherlynn Kaiser MD Signature Date/Time: 10/30/2019/11:12:26 PM    Final     Cardiac Studies   Echo 10/30/19:  1. Left ventricular ejection  fraction, by estimation, is 50 to 55%. The  left ventricle has low normal function. Left ventricular endocardial  border not optimally defined to evaluate regional wall motion. There is  moderate left ventricular hypertrophy.  Left ventricular diastolic parameters are indeterminate. Elevated left  ventricular end-diastolic pressure.  2. Right ventricular systolic function is mildly reduced. The right  ventricular size is moderately enlarged. There is moderately elevated  pulmonary artery systolic pressure.  3. The mitral valve is degenerative. Trivial mitral valve regurgitation.  No evidence of mitral stenosis.  4. The aortic valve is abnormal. Aortic valve regurgitation is not  visualized. Aortic valve mean gradient measures 7.0 mmHg. Aortic valve has  moderately reduced cusp mobility, suggesting gradient may be  underestimated, with at least mild aortic valve  stenosis.  5. The inferior vena cava is normal in size with greater than 50%  respiratory variability, suggesting right atrial pressure of 3 mmHg.    Cardiac cath 10/15/18   Prox RCA lesion is 100% stenosed.  SVG graft was visualized by angiography and is normal in caliber.  Prox Cx to Mid Cx lesion is 100% stenosed.  SVG graft was visualized by angiography and is normal in caliber.  Ost LAD to Prox LAD lesion is 100% stenosed.  LIMA graft was visualized by angiography and is normal in caliber.  SVG graft was not injected.  Mid Graft lesion before 2nd Mrg is 99% stenosed.  A drug-eluting stent was successfully placed using a STENT RESOLUTE [id number].  Post intervention, there is  a 0% residual stenosis.  1. 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 Chronic occlusion proximal LAD. Patent LIMA to mid LAD. Chronic occlusion of the vein graft to the Diagonal branch (not injected-known occlusion) 3. Chronic occlusion mid Circumflex. Patent sequential vein graft to OM2/OM3. Severe stenosis mid  body of SVG to OM2/OM3 within the old stent 4. Successful PTCA/DES x 1 mid body of SVG to OM2/OM3 5. Chronic occlusion proximal RCA. Patent SVG to distal RCA  Recommendations: ASA and Plavix along with Eliquis for one month. Would stop ASA at one month and continue Plavix along with Eliquis for at least one year. Continue statin and beta blocker. Resume Eliquis tomorrow.    Patient Profile     74 y.o. male with a hx of coronary artery disease, CABG, hypertension, hyperlipidemia, paroxysmal atrial fibrillation, hypothyroidism, CKD who presented 10/23/2019 with chest pain and was found to have a GI bleedand  elevated troponins, elevated Cr and acute anemia with drop in Hgb from 16 to 8.  Heme+ stools. Eliquis and Plavix held.   Assessment & Plan    1.  Non-STEMI/CAD: He experiences chest pain with rapid atrial fibrillation. Currently on Toprol-XL 12.5 mg daily. BP has been fluctuating. I will give IV metoprolol 5 mg to assist with HR control. My hope is HR will become better controlled after PRBC transfusion. Troponin 4646 on morning of 10/30/2019.  Will repeat troponin given recurrent chest pain. Echocardiogram demonstrates low normal LV systolic function, EF 50 to 55%.  Plan is to proceed with cardiac catheterization as soon as he is deemed stable from a GI standpoint.  The plan is for EGD and colonoscopy on Monday.  Presently on statin as well. Hgb down to 8.1. Given repeated episodes of chest pain, will transfuse 1 unit PRBC's.  2.  Acute GI bleed: Clopidogrel and apixaban on hold.  Plans for EGD and colonoscopy this upcoming Monday.  Hgb down to 8.1. Given repeated episodes of chest pain, will transfuse 1 unit PRBC's.  3.  Paroxysmal atrial fibrillation: Anticoagulation on hold.  Presently on dronedarone and metoprolol.  He is having paroxysms of rapid atrial fibrillation leading to chest pain. Currently on Toprol-XL 12.5 mg daily. BP has been fluctuating. I will give IV metoprolol 5 mg to assist  with HR control.  My hope is HR will become better controlled after PRBC transfusion. If it remains difficult to control, consider esmolol drip (would require floor transfer).  4.  Hypertension: BP fluctuates with low normal readings today and up to 130/86 when I evaluated him.  5.  Hyperlipidemia: On rosuvastatin.  6.  Acute kidney injury: Creatinine 2.06.    For questions or updates, please contact [place] Please consult [email or web address] for contact info under        Signed, Kate Sable, MD  11/01/2019, 10:35 AM

## 2019-11-02 ENCOUNTER — Inpatient Hospital Stay (HOSPITAL_COMMUNITY): Payer: 59 | Admitting: Anesthesiology

## 2019-11-02 ENCOUNTER — Encounter (HOSPITAL_COMMUNITY): Payer: Self-pay | Admitting: Internal Medicine

## 2019-11-02 ENCOUNTER — Encounter (HOSPITAL_COMMUNITY): Admission: EM | Disposition: E | Payer: Self-pay | Source: Home / Self Care | Attending: Internal Medicine

## 2019-11-02 DIAGNOSIS — K635 Polyp of colon: Secondary | ICD-10-CM

## 2019-11-02 DIAGNOSIS — K317 Polyp of stomach and duodenum: Secondary | ICD-10-CM

## 2019-11-02 HISTORY — PX: HEMOSTASIS CONTROL: SHX6838

## 2019-11-02 HISTORY — PX: HEMOSTASIS CLIP PLACEMENT: SHX6857

## 2019-11-02 HISTORY — PX: ENDOSCOPIC MUCOSAL RESECTION: SHX6839

## 2019-11-02 HISTORY — PX: COLONOSCOPY WITH PROPOFOL: SHX5780

## 2019-11-02 HISTORY — PX: ESOPHAGOGASTRODUODENOSCOPY (EGD) WITH PROPOFOL: SHX5813

## 2019-11-02 HISTORY — PX: HOT HEMOSTASIS: SHX5433

## 2019-11-02 HISTORY — PX: SUBMUCOSAL LIFTING INJECTION: SHX6855

## 2019-11-02 LAB — TYPE AND SCREEN
ABO/RH(D): A POS
Antibody Screen: NEGATIVE
Unit division: 0

## 2019-11-02 LAB — GLUCOSE, CAPILLARY
Glucose-Capillary: 165 mg/dL — ABNORMAL HIGH (ref 70–99)
Glucose-Capillary: 155 mg/dL — ABNORMAL HIGH (ref 70–99)
Glucose-Capillary: 137 mg/dL — ABNORMAL HIGH (ref 70–99)
Glucose-Capillary: 161 mg/dL — ABNORMAL HIGH (ref 70–99)

## 2019-11-02 LAB — BPAM RBC
Blood Product Expiration Date: 202105292359
ISSUE DATE / TIME: 202105231154
Unit Type and Rh: 600

## 2019-11-02 SURGERY — COLONOSCOPY WITH PROPOFOL
Anesthesia: Monitor Anesthesia Care

## 2019-11-02 MED ORDER — ALBUMIN HUMAN 5 % IV SOLN: INTRAVENOUS | Status: DC | PRN

## 2019-11-02 MED ORDER — SODIUM CHLORIDE 0.9 % IV SOLN: INTRAVENOUS | Status: DC | PRN

## 2019-11-02 MED ORDER — DILTIAZEM HCL-DEXTROSE 125-5 MG/125ML-% IV SOLN (PREMIX)
INTRAVENOUS | Status: DC | PRN
  Administered 2019-11-02: 5 mg/h via INTRAVENOUS

## 2019-11-02 MED ORDER — EPINEPHRINE 1 MG/10ML IJ SOSY
PREFILLED_SYRINGE | INTRAMUSCULAR | Status: AC
  Filled 2019-11-02: qty 10

## 2019-11-02 MED ORDER — PROPOFOL 500 MG/50ML IV EMUL
INTRAVENOUS | Status: DC | PRN
  Administered 2019-11-02: 50 ug/kg/min via INTRAVENOUS

## 2019-11-02 MED ORDER — ESMOLOL HCL 100 MG/10ML IV SOLN
INTRAVENOUS | Status: DC | PRN
  Administered 2019-11-02: 50 mg via INTRAVENOUS

## 2019-11-02 MED ORDER — LORAZEPAM 0.5 MG PO TABS
0.5000 mg | ORAL_TABLET | Freq: Once | ORAL | Status: AC
  Administered 2019-11-02: 0.5 mg via ORAL
  Filled 2019-11-02: qty 1

## 2019-11-02 MED ORDER — ONDANSETRON HCL 4 MG/2ML IJ SOLN
INTRAMUSCULAR | Status: DC | PRN
  Administered 2019-11-02: 4 mg via INTRAVENOUS

## 2019-11-02 MED ORDER — PANTOPRAZOLE SODIUM 40 MG IV SOLR
40.0000 mg | Freq: Two times a day (BID) | INTRAVENOUS | Status: AC
  Administered 2019-11-02 – 2019-11-03 (×2): 40 mg via INTRAVENOUS
  Filled 2019-11-02 (×2): qty 40

## 2019-11-02 MED ORDER — PANTOPRAZOLE SODIUM 40 MG PO TBEC
40.0000 mg | DELAYED_RELEASE_TABLET | Freq: Two times a day (BID) | ORAL | Status: DC
  Administered 2019-11-04 – 2019-11-05 (×4): 40 mg via ORAL
  Filled 2019-11-02 (×5): qty 1

## 2019-11-02 MED ORDER — PHENYLEPHRINE HCL-NACL 10-0.9 MG/250ML-% IV SOLN
INTRAVENOUS | Status: DC | PRN
  Administered 2019-11-02: 25 ug/min via INTRAVENOUS

## 2019-11-02 MED ORDER — MAGNESIUM CITRATE PO SOLN
1.0000 | Freq: Once | ORAL | Status: AC
  Administered 2019-11-02: 1 via ORAL
  Filled 2019-11-02: qty 296

## 2019-11-02 MED ORDER — DILTIAZEM HCL-DEXTROSE 125-5 MG/125ML-% IV SOLN (PREMIX)
5.0000 mg/h | INTRAVENOUS | Status: DC
  Administered 2019-11-03: 5 mg/h via INTRAVENOUS
  Filled 2019-11-02: qty 125

## 2019-11-02 MED ORDER — SODIUM CHLORIDE (PF) 0.9 % IJ SOLN
PREFILLED_SYRINGE | INTRAMUSCULAR | Status: DC | PRN
  Administered 2019-11-02: 5 mL

## 2019-11-02 SURGICAL SUPPLY — 25 items

## 2019-11-02 NOTE — Op Note (Signed)
[facility] Patient Name: Matthew [name] Procedure Date : 11/03/2019 MRN: [id number] Attending MD: Justice Britain , MD Date of Birth: [birth year] CSN: [id number] Age: 74 Admit Type: Inpatient Procedure:                Upper GI endoscopy Indications:              Occult blood in stool, Unexplained chest pain,                            Anemia Providers:                Justice Britain, MD, Baird Cancer, RN, Lazaro Arms, Technician Referring MD:             Milus Banister, MD, [facility],                            Cardiology Service Medicines:                Monitored Anesthesia Care Complications:            No immediate complications. Estimated Blood Loss:     Estimated blood loss was minimal. Procedure:                Pre-Anesthesia Assessment:                           - Prior to the procedure, a History and Physical                            was performed, and patient medications and                            allergies were reviewed. The patient's tolerance of                            previous anesthesia was also reviewed. The risks                            and benefits of the procedure and the sedation                            options and risks were discussed with the patient.                            All questions were answered, and informed consent                            was obtained. Prior Anticoagulants: The patient                            last took Eliquis (apixaban) 5 days and Plavix                            (clopidogrel)  5 days prior to the procedure. ASA                            Grade Assessment: III - A patient with severe                            systemic disease. After reviewing the risks and                            benefits, the patient was deemed in satisfactory                            condition to undergo the procedure.                           After obtaining informed consent,  the endoscope was                            passed under direct vision. Throughout the                            procedure, the patient's blood pressure, pulse, and                            oxygen saturations were monitored continuously. The                            [id number]-[id number] ([id number]) Olympus gastroscope was                            introduced through the mouth, and advanced to the                            second part of duodenum. The upper GI endoscopy was                            accomplished without difficulty. The patient                            tolerated the procedure. Scope In: Scope Out: Findings:      No gross lesions were noted in the proximal esophagus and in the mid       esophagus.      LA Grade A (one or more mucosal breaks less than 5 mm, not extending       between tops of 2 mucosal folds) esophagitis with no bleeding was found       in the distal esophagus.      A medium-sized hiatal hernia was found. The proximal extent of the       gastric folds (end of tubular esophagus) was 37 cm from the incisors.       The hiatal narrowing was 42 cm from the incisors. The Z-line was 37 cm       from the incisors.      Multiple 4 to 18 mm pedunculated and sessile polyps with bleeding and  stigmata of recent bleeding were found in the cardia, in the gastric       body, at the incisura and in the gastric antrum. The largest 18 mm polyp       had evidence of active red oozing that was present and an erosion.       Preparations were made for mucosal resection. A 1:100000 solution of       epinephrine was injected to raise the lesion for a total of 3 cc. Orise       gel was then used for further lift. Snare mucosal resection was       performed. Resection and retrieval were complete. To prevent bleeding       after mucosal resection, three hemostatic clips were successfully placed       (MR conditional). There was no bleeding during, or at the end, of the        procedure. A second lesion polyp with evidence of recent bleeding was       noted in the antrum. Preparations were made for mucosal resection. A       1:100000 solution of epinephrine was injected to raise the lesion for a       total of 2 cc. Snare mucosal resection was performed. Resection and       retrieval were complete. To prevent bleeding after mucosal resection,       two hemostatic clips were successfully placed (MR conditional). There       was no bleeding during, or at the end, of the procedure. Other polyps       did not show clear evidence of erosions or recent bleeding. They were       not removed. This would require multiple resections >15 if all were to       be removed and it is not clear that is necessary.      Patchy mildly erythematous mucosa without bleeding was found in the       entire examined stomach.      No gross lesions were noted in the duodenal bulb, in the first portion       of the duodenum and in the second portion of the duodenum. Impression:               - No gross lesions in esophagus proximally. LA                            Grade A esophagitis with no bleeding distally.                           - Medium-sized hiatal hernia.                           - Multiple gastric polyps. 1 larger polyp with                            evidence of active oozing. 1 smaller polyp with                            erosion and recent bleeding. Both were resected via  mucosal resection as noted above. Clips placed to                            decrease risk of bleeding post-intervention.                           - Erythematous mucosa in the stomach.                           - No gross lesions in the duodenal bulb, in the                            first portion of the duodenum and in the second                            portion of the duodenum. Recommendation:           - Proceed to scheduled colonoscopy.                           - Observe  patient's clinical course.                           - Start Protonix 40 mg BID and maintain -                            reasonable for 24 hours of IV PPI (which I have                            ordered) and then PO PPI BID.                           - Recommend H. pylori IgG and if positive then                            treatment for H. pylori.                           - Minimize NSAIDs as able moving forward.                           - Observe patient's clinical course.                           - Trend Hgb/Hct.                           - In normal circumstances, I would hold                            Eliquis/Plavix for 48 hours before restart.                            However, it is not clear that we can do that in  this patient due to his cardiac issues at this                            time. The clips should hopefully prevent further                            issues in regards to bleeding risk but are not 100%                            effective at all times. If he needs to be initiated                            on anticoagulation would recommend IV formulations.                            If Plavix or Anti-PLT therapy is required then move                            forward and we will deal with any GI bleeding                            should it occur.                           - The findings and recommendations were discussed                            with the patient.                           - The findings and recommendations were discussed                            with the patient's family.                           - The findings and recommendations were discussed                            with the referring physicians. Procedure Code(s):        --- Professional ---                           [phone], Esophagogastroduodenoscopy, flexible,                            transoral; with endoscopic mucosal resection Diagnosis Code(s):         --- Professional ---                           K20.90, Esophagitis, unspecified without bleeding                           K44.9, Diaphragmatic hernia without obstruction or  gangrene                           K31.7, Polyp of stomach and duodenum                           K31.89, Other diseases of stomach and duodenum                           R19.5, Other fecal abnormalities                           R07.9, Chest pain, unspecified CPT copyright 2019 [employer]. All rights reserved. The codes documented in this report are preliminary and upon coder review may  be revised to meet current compliance requirements. Justice Britain, MD 10/31/2019 4:47:51 PM Number of Addenda: 0

## 2019-11-02 NOTE — Op Note (Signed)
[facility] Patient Name: Matthew [name] Procedure Date : 10/23/2019 MRN: [id number] Attending MD: Justice Britain , MD Date of Birth: 1945-10-17 CSN: [id number] Age: 74 Admit Type: Inpatient Procedure:                Colonoscopy Indications:              Gastrointestinal occult blood loss, Anemia Providers:                Justice Britain, MD, Baird Cancer, RN, Lazaro Arms, Technician Referring MD:             Milus Banister, MD, [facility] Medicines:                Monitored Anesthesia Care Complications:            No immediate complications. Estimated Blood Loss:     Estimated blood loss was minimal. Procedure:                Pre-Anesthesia Assessment:                           - Prior to the procedure, a History and Physical                            was performed, and patient medications and                            allergies were reviewed. The patient's tolerance of                            previous anesthesia was also reviewed. The risks                            and benefits of the procedure and the sedation                            options and risks were discussed with the patient.                            All questions were answered, and informed consent                            was obtained. Prior Anticoagulants: The patient                            last took Eliquis (apixaban) 5 days and Plavix                            (clopidogrel) 5 days prior to the procedure. ASA                            Grade Assessment: III - A patient with severe  systemic disease. After reviewing the risks and                            benefits, the patient was deemed in satisfactory                            condition to undergo the procedure.                           After obtaining informed consent, the colonoscope                            was passed under direct vision. Throughout the                             procedure, the patient's blood pressure, pulse, and                            oxygen saturations were monitored continuously. The                            CF-HQ190L ([id number]) Olympus colonoscope was                            introduced through the anus and advanced to the the                            cecum, identified by appendiceal orifice and                            ileocecal valve. The colonoscopy was performed                            without difficulty. The patient tolerated the                            procedure. The quality of the bowel preparation was                            adequate. The ileocecal valve, appendiceal orifice,                            and rectum were photographed. Could not retroflex                            due to short rectum. Scope In: 4:02:28 PM Scope Out: 4:24:08 PM Scope Withdrawal Time: 0 hours 16 minutes 17 seconds  Total Procedure Duration: 0 hours 21 minutes 40 seconds  Findings:      The digital rectal exam findings include hemorrhoids. Pertinent       negatives include no palpable rectal lesions.      A moderate amount of semi-liquid stool yellow/brown was found in the       entire colon, interfering with visualization. Lavage of the area was       performed  using copious amounts, resulting in clearance with adequate       visualization.      Multiple sessile polyps (8) were found in the recto-sigmoid colon,       sigmoid colon, descending colon, transverse colon and ascending colon.       The polyps were 2 to 6 mm in size. Polypectomy was not attempted.      Non-bleeding non-thrombosed external and internal hemorrhoids were found       during perianal exam and during digital exam. The hemorrhoids were Grade       II (internal hemorrhoids that prolapse but reduce spontaneously). Could       not retroflex safely with short rectum. Impression:               - Hemorrhoids found on digital rectal exam.                            - Yellow/brown stool in the entire examined colon.                            Lavaged with adequate visualization.                           - Multiple 2 to 6 mm polyps at the recto-sigmoid                            colon, in the sigmoid colon, in the descending                            colon, in the transverse colon and in the ascending                            colon. Resection not attempted.                           - Non-bleeding non-thrombosed external and internal                            hemorrhoids.                           - No evidence of recent bleeding from lower GI                            tract. Recommendation:           - The patient will be observed post-procedure,                            until all discharge criteria are met.                           - Return patient to hospital ward for ongoing care.                           - Trend Hgb/Hct                           -  At some point patient will need a repeat                            colonoscopy, if he remains a candidate for colon                            polyp resection, but these precancerous polyps are                            small and not concerning at this time.                           - Would not pursue VCE at this time. Source of FOBT                            anemia likely from gastric lesions.                           - Anticoagulation as per EGD report and discussion                            with Cardiology and Primary Medical Service.                           - The findings and recommendations were discussed                            with the patient.                           - The findings and recommendations were discussed                            with the patient's family.                           - The findings and recommendations were discussed                            with the referring physician. Procedure Code(s):        --- Professional ---                            [phone], Colonoscopy, flexible; diagnostic, including                            collection of specimen(s) by brushing or washing,                            when performed (separate procedure) Diagnosis Code(s):        --- Professional ---                           K64.1, Second degree hemorrhoids  K63.5, Polyp of colon                           R19.5, Other fecal abnormalities CPT copyright 2019 [employer]. All rights reserved. The codes documented in this report are preliminary and upon coder review may  be revised to meet current compliance requirements. Justice Britain, MD 10/30/2019 5:07:46 PM Number of Addenda: 0

## 2019-11-02 NOTE — Anesthesia Preprocedure Evaluation (Addendum)
Anesthesia Evaluation  Patient identified by MRN, date of birth, ID band Patient awake    Reviewed: Allergy & Precautions, NPO status , Patient's Chart, lab work & pertinent test results  Airway Mallampati: II  TM Distance: >3 FB Neck ROM: Full    Dental  (+) Dental Advisory Given, Edentulous Lower, Edentulous Upper   Pulmonary sleep apnea and Continuous Positive Airway Pressure Ventilation ,     + decreased breath sounds      Cardiovascular hypertension, Pt. on home beta blockers + angina (He is not a cath candidate due to GI bleed and plavix /eliquis have been held) + CAD, + Past MI, + Cardiac Stents and + CABG  + dysrhythmias + Valvular Problems/Murmurs AS  Rhythm:Regular Rate:Normal  Echo 10/30/19: 1. Left ventricular ejection fraction, by estimation, is 50 to 55%. The  left ventricle has low normal function. Left ventricular endocardial  border not optimally defined to evaluate regional wall motion. There is  moderate left ventricular hypertrophy.  Left ventricular diastolic parameters are indeterminate. Elevated left  ventricular end-diastolic pressure.  2. Right ventricular systolic function is mildly reduced. The right  ventricular size is moderately enlarged. There is moderately elevated  pulmonary artery systolic pressure.  3. The mitral valve is degenerative. Trivial mitral valve regurgitation.  No evidence of mitral stenosis.  4. The aortic valve is abnormal. Aortic valve regurgitation is not  visualized. Aortic valve mean gradient measures 7.0 mmHg. Aortic valve has  moderately reduced cusp mobility, suggesting gradient may be  underestimated, with at least mild aortic valve  stenosis.  5. The inferior vena cava is normal in size with greater than 50%  respiratory variability, suggesting right atrial pressure of 3 mmHg.    Neuro/Psych  Headaches, PSYCHIATRIC DISORDERS Depression Dementia  Neuromuscular disease     GI/Hepatic Neg liver ROS, PUD, GERD  ,  Endo/Other  diabetes, Insulin DependentHypothyroidism   Renal/GU Renal InsufficiencyRenal disease     Musculoskeletal  (+) Arthritis ,   Abdominal   Peds  Hematology  (+) Blood dyscrasia (Eliquis, Plavix), anemia ,   Anesthesia Other Findings Day of surgery medications reviewed with the patient.  Reproductive/Obstetrics                            Anesthesia Physical Anesthesia Plan  ASA: IV  Anesthesia Plan: MAC   Post-op Pain Management:    Induction: Intravenous  PONV Risk Score and Plan: 1 and Propofol infusion and Treatment may vary due to age or medical condition  Airway Management Planned: Natural Airway and Nasal Cannula  Additional Equipment:   Intra-op Plan:   Post-operative Plan:   Informed Consent: I have reviewed the patients History and Physical, chart, labs and discussed the procedure including the risks, benefits and alternatives for the proposed anesthesia with the patient or authorized representative who has indicated his/her understanding and acceptance.     Dental advisory given  Plan Discussed with: CRNA  Anesthesia Plan Comments:         Anesthesia Quick Evaluation

## 2019-11-02 NOTE — Anesthesia Postprocedure Evaluation (Signed)
Anesthesia Post Note  Patient: Matthew [name]  Procedure(s) Performed: COLONOSCOPY WITH PROPOFOL (N/A ) ESOPHAGOGASTRODUODENOSCOPY (EGD) WITH PROPOFOL (N/A ) SUBMUCOSAL LIFTING INJECTION HEMOSTASIS CLIP PLACEMENT HOT HEMOSTASIS (ARGON PLASMA COAGULATION/BICAP) (N/A ) HEMOSTASIS CONTROL     Patient location during evaluation: PACU Anesthesia Type: MAC Level of consciousness: awake and alert Pain management: pain level controlled Vital Signs Assessment: post-procedure vital signs reviewed and stable Respiratory status: spontaneous breathing and respiratory function stable Cardiovascular status: stable Postop Assessment: no apparent nausea or vomiting Anesthetic complications: no    Last Vitals:  Vitals:   10/17/2019 1650 10/23/2019 1657  BP: 98/71 98/72  Pulse: 88 79  Resp: (!) 26 (!) 27  Temp:    SpO2: 94% 93%    Last Pain:  Vitals:   10/28/2019 1657  TempSrc:   PainSc: 0-No pain                 Calvina Liptak DANIEL

## 2019-11-02 NOTE — Progress Notes (Signed)
          Daily Rounding Note  10/20/2019, 8:33 AM  LOS: 4 days   SUBJECTIVE:   Chief complaint: anemia.  FOBT +.  Chest pressure/pain    Still w chest pressure, despite IV NTG.  Pain wrse with PO, though not clearly odynophagia.  Chest pain was gone but returned once pt found out he would need the mag citrate.   resp wise says same dyspnea as always.   Stools watery, brown, not clear, no blood so bottle mag citrate ordered along w TW enema.    OBJECTIVE:         Vital signs in last 24 hours:    Temp:  [97.6 F (36.4 C)-98.7 F (37.1 C)] 98.7 F (37.1 C) (05/24 0803) Pulse Rate:  [62-114] 82 (05/24 0803) Resp:  [18-23] 20 (05/24 0803) BP: (96-135)/(63-101) 112/78 (05/24 0803) SpO2:  [93 %-100 %] 93 % (05/24 0803) Weight:  [123.3 kg] 123.3 kg (05/24 0451) Last BM Date: 10/30/19 Filed Weights   10/31/19 0350 11/01/19 0434 10/20/2019 0451  Weight: 123.9 kg 124.3 kg 123.3 kg   General: anxious, comfortable.  Looks chronically ill.    Heart: RRR Chest: clear bil though diminished BS bil.   Abdomen: obese, soft, NT, active BS  Extremities: minor, non-pitting LE edema.   Neuro/Psych:  Alert, oriented x 3.  Moves all 4 liimbs.  Anxious.    Intake/Output from previous day: 05/23 0701 - 05/24 0700 In: 796.3 [P.O.:480; I.V.:1.3; Blood:315] Out: 1100 [Urine:1100]  Intake/Output this shift: No intake/output data recorded.  Lab Results: Recent Labs    10/31/19 1030 11/01/19 0326 11/01/19 1656  WBC 8.5 7.3  --   HGB 8.4* 8.1* 9.6*  HCT 27.9* 27.2* 31.5*  PLT 277 261  --    BMET Recent Labs    10/31/19 1030 11/01/19 0326  NA 139 133*  K 5.1 4.5  CL 103 102  CO2 26 25  GLUCOSE 218* 204*  BUN 19 22  CREATININE 2.08* 2.06*  CALCIUM 9.0 8.8*   LFT No results for input(s): PROT, ALBUMIN, AST, ALT, ALKPHOS, BILITOT, BILIDIR, IBILI in the last 72 hours. PT/INR No results for input(s): LABPROT, INR in the last 72 hours.  Hepatitis Panel No results for input(s): HEPBSAG, HCVAB, HEPAIGM, HEPBIGM in the last 72 hours.  Studies/Results: DG CHEST PORT 1 VIEW  Result Date: 11/01/2019 CLINICAL DATA:  Chest pain EXAM: PORTABLE CHEST 1 VIEW COMPARISON:  11/01/2019 FINDINGS: Cardiac shadow is enlarged but stable. Postsurgical changes are again seen. Lungs are well aerated bilaterally. Mild scarring is noted in the right base. No acute bony abnormality is seen. IMPRESSION: No acute abnormality noted. Electronically Signed   By: Inez Catalina M.D.   On: 11/01/2019 16:28    ASSESMENT:   *  FOBT +.  Anemia, drop Hgb over several months (no interim levels).  No melena, BPR etc Taking PO iron for a long time.    *   Non-exertional chest pain, not well relieved w NTG.  Has GI component.   *   Depression, anxiety, multiple psych meds.    *    Chronic constipation.  Oxycodone, iron and other meds signif contributors.      PLAN   *   9833 colon/egd today.    *   Ordered single dose 0.5 mg Ativan po now.      Azucena Freed  10/10/2019, 8:33 AM Phone [phone]

## 2019-11-02 NOTE — Progress Notes (Signed)
PROGRESS NOTE  Matthew [name] BUL:[id number] DOB: Apr 18, 1946 DOA: 10/22/2019 PCP: Marda Stalker, PA-C  Brief History    Matthew [name] is a 74 y.o. male with medical history significant of HTN, HLD, PAF on Eliquis, CAD s/p CABGx5v and stents, AAA, hypothyroidism, CKD stage III, chronic pain, and GERD who presented with complaints of chest pain.  Patient had been watching the basketball game last night and never really got to sleep with he had acute onset of left-sided chest pain.  He described it as dull pain with radiation down his arm.  With the chest pain he denied having any diaphoresis, nausea, vomiting, or shortness of breath.  Reported having similar symptoms 3 days ago with diaphoresis and nausea, but resolved with nitroglycerin.  Last night when he attempted to take a nitroglycerin he had no relief in symptoms.  Denies any recent change in weight, leg swelling, abdominal pain in, or diarrhea.  Other associated symptoms include chronic shortness of breath unchanged, chronic constipation for which he is on stool softeners, and reported seeing some bright red blood in his stools approximately 3 days ago.  He is not on any NSAIDs, but is on Eliquis and Plavix.  Patient had a left heart cath by Dr. Angelena Form in 10/2018 which showed severe triple-vessel coronary artery disease status post 5 vessel CABG with 4/5 patent grafts with successful DES to mid body of SVG to OM2/OM3.  Patient had prior colonoscopy in 2015 noted 5 sessile polyps which were removed.  Endoscopy from 2016 and multiple gastric polyps and Barrett's esophagus.   Patient was given 324 mg of aspirin in route with EMS   ED Course: Upon admission into the emergency department patient was seen to be afebrile with heart rates elevated up to 154, and all other vital signs maintained.  Labs significant for hemoglobin 8.8, BUN 22, creatinine 2.13, glucose 243, and high-sensitivity troponin 61 which has increased to 98 and then 4646 .   Stool guaiacs were noted to be positive, but not reported to have gross blood on rectal exam.  Chest x-ray showed no acute abnormalities.  Patient was given Tylenol and fentanyl.  TRH called to admit.  The patient has been admitted to a telemetry bed. Cardiology and gastroenterology  have been consulted. Echocardiogram pending. Plavix and Eliquis have been held for now. Evaluation for possible source of GI bleed will need to be completed before cardiology can entertain LHC which will be necessary. Gastroenterology will perform EGD and colonoscopy planned for Monday when plavix has had a chance to wash out. We will monitor Hgb and transfuse for Hgb greater than 8.0.  The patient has been having complaints of chest pain this morning. SL NTG dropped his pressure abruptly. The patient has been evaluated by cardiology. He was given IV metoprolol, SL nitro, and a GI cocktail without improvement He has been placed on nitropaste and an esmolol drip. I have given the patient morphine to address his discomfort. GI cocktail was not helpful. EKG demonstrated AF with RVR. Morphine given for pain. Cardiology has ordered transfusion as Hgb is 8.1 this morning. This correlated with an elevated troponin at 1042 which has trended down to 882. Cardidology is monitoring carefully. The patient needs LHC, but must have EGC, CSPY first. Cardiology has given the patient IV amiodarone to control rate and placed the patient on a nitro drip. They ordered CXR to evaluate the possibility of volume overload. It showed no abnormality. The patient remains on nitroglycerin gtt due to  ongoing chest pain.  Consultants  . Gastroenterology . Cardiology  Procedures  . EGD and Colonscopy are planned today.  Antibiotics   Anti-infectives (From admission, onward)   None     Subjective  The patient states that pain remains, but is not severe. He states that it is worse with swallowing.  Objective   Vitals:  Vitals:   10/24/2019 1103  10/12/2019 1250  BP: 121/76 (!) 111/48  Pulse: 85 82  Resp: 19 (!) 24  Temp: 97.9 F (36.6 C) 97.9 F (36.6 C)  SpO2: 97% 94%   Exam:  Constitutional:  . The patient is awake, alert, and oriented x 3. Mild pain from chest discomfort. Respiratory:  . No increased work of breathing. . No wheezes, rales, or rhonchi . No tactile fremitus Cardiovascular:  . Regular rate and rhythm . No murmurs, ectopy, or gallups. . No lateral PMI. No thrills. Abdomen:  . Abdomen is soft, non-tender, non-distended . No hernias, masses, or organomegaly . Normoactive bowel sounds.  Musculoskeletal:  . No cyanosis, clubbing, or edema Skin:  . No rashes, lesions, ulcers . palpation of skin: no induration or nodules Neurologic:  . CN 2-12 intact . Sensation all 4 extremities intact Psychiatric:  . Mental status o Mood, affect appropriate o Orientation to person, place, time  . judgment and insight appear intact  I have personally reviewed the following:   Today's Data  . Vitals, Hemoglobin and hematocrit  Cardiology Data  . Echocardiogram: Atrial fibrillation with RVR  Scheduled Meds: . [MAR Hold] sodium chloride   Intravenous Once  . [MAR Hold] buPROPion  300 mg Oral Daily  . [MAR Hold] dronedarone  400 mg Oral BID WC  . [MAR Hold] ezetimibe  5 mg Oral QHS  . [MAR Hold] ferrous sulfate  325 mg Oral Q M,W,F  . [MAR Hold] finasteride  5 mg Oral QHS  . [MAR Hold] gabapentin  300 mg Oral BID  . [MAR Hold] insulin aspart  0-15 Units Subcutaneous TID WC  . [MAR Hold] insulin aspart  0-5 Units Subcutaneous QHS  . [MAR Hold] insulin detemir  25 Units Subcutaneous BID  . [MAR Hold] levothyroxine  150 mcg Oral QAC breakfast  . [MAR Hold] magnesium oxide  400 mg Oral BID  . [MAR Hold] melatonin  3 mg Oral QHS  . [MAR Hold] metoprolol succinate  12.5 mg Oral Daily  . [MAR Hold] midodrine  2.5 mg Oral TID WC  . [MAR Hold] nortriptyline  50 mg Oral QHS  . [MAR Hold] rosuvastatin  40 mg Oral QHS   . [MAR Hold] senna  2 tablet Oral q AM   And  . [MAR Hold] senna  1 tablet Oral QHS  . [MAR Hold] sertraline  25 mg Oral BH-q7a  . [MAR Hold] tamsulosin  0.4 mg Oral Daily  . [MAR Hold] traZODone  100 mg Oral QHS   Continuous Infusions: . [MAR Hold] nitroGLYCERIN 20 mcg/min (11/01/2019 1106)    Principal Problem:   Chest pain Active Problems:   Hypothyroidism   PAF (paroxysmal atrial fibrillation) (HCC)   CKD (chronic kidney disease), stage III   AAA (abdominal aortic aneurysm) (HCC)   Diabetic polyneuropathy associated with type 2 diabetes mellitus (HCC)   OSA (obstructive sleep apnea)   Prolonged QT interval   Depression   GI bleed   Acute blood loss anemia   Hyperlipidemia LDL goal <70   AKI (acute kidney injury) ([place])   Blood in stool   LOS:  4 days   A & P  Chest pain/Elevated troponin/ Known CAD: Acute on chronic.  Patient presented with complaints of chest pain which woke him out of sleep.  Not relieved with nitroglycerin.  Troponin 61->98. Chest x-ray was noted to be clear.  Patient's last heart catheter from 10/2018 with severe triple vessel coronary artery disease 4/5 patent grafts and had drug-eluting stent placed at the mid body of SVG to OM2/OM3.  Suspect secondary to demand in the setting of acute blood loss. The patient has been admitred to a progressive bed. Troponins have been trended down to 13. Plavix and Eliquis have been held pending GI eval. LHC is anticipated if patient can be cleared by GI after EGD and colonoscopy. Echocardiogram has been performed. It demonstrated EF of 50-55%. There is moderate LVH. Regional wall motion is not well defined. RV systolic function is mildly reduced. RV is moderately enlarged. There is moderately elevated pulmonary artery systolic pressure. Chest pain this morning. Pt is receiving morphine, nitropaste, and IV esmolol. EKG demonstrates AF with RVR. No response to GI cocktail. He is receiving 1 unit of PRBC's in transfusion.  GI  bleed/Acute blood loss anemia: Acute.  Patient's baseline hemoglobin previously noted to be 15.1 on 08/22/19, but patient presents with hemoglobin down to 8.8.  Stool guaiacs were noted to be positive. Review of records note previous history of gastric as well as sessile polyps on previous EGD and colonoscopy from 2016 and 2015 respectively.  Patient's Eliquis and plavix have been held. Cardiology is aware. Clear liquid diet as tolerated and he has been advanced to a carb modified diet. Hemoglobin will be monitored Q8hours and the patient will be transfused for a hemoglobin of less than 8.0 per cardilogy's recommendation. I appreciate GI consultative services. Hgb is 8.1 this am. He is receiving 1 unit of PRBC's in transfusion. The chest pain correlated with an elevated troponin at 1042 which has trended down to 882. Cardidology is monitoring carefully. The patient needs LHC, but must have EGC, CSPY first. Cardiology has given the patient IV amiodarone to control rate and placed the patient on a nitro drip. Hemoglobin is stable at 9.6 this morning. Plan is for EGD, CSPY today.   Paroxysmal atrial fibrillation on chronic anticoagulation: Patient sinus tachycardia with heart rates into the 120s.  CHA2DS2-VASc score = >2. Home medications include Eliquis, dronedarone 400 mg twice daily, and metoprolol 12.5 mg daily. Hold Eliquis due to acute blood loss anemia suspected secondary to GI bleed. Continue metoprolol and dronedarone. Cardiology has initiated an esmolol drip and amiodarone to help control rate.   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 Last hemoglobin A1c noted to be 7.6 on 08/22/2019.  Home medications include Levemir 50 units twice daily. The patient is on a carbohydrate modified diet and a hypoglycemic protocol. He has been continued on gabapentin and glucoses will be followed with FSBS and SSI. He will be continued on his home dose of lantus, 50 units. Monitior. Glucoses today ran from  151-253.Marland Kitchen   Prolonged QT interval: QTC elevated up to 532 on admission. Correct any electrolyte abnormalities. Continue to monitor QT interval.   AAA: 3 x 3.2 cm aneurysm of the distal abdominal aorta on CT in 10/2018. Likely needs follow-up surveillance sometime this month.   Hypothyroidism: Continue levothyroxine   Depression: Home medications include Zoloft 25 mg every morning, Wellbutrin 300 mg daily, nortriptyline 25 mg nightly, and trazodone 100 mg nightly. These have been continued due to the patient's  explicit request.   Chronic kidney disease stage IIIb: Creatinine 1.96 which appears near patient's baseline.   Mild memory loss: Patient just recently started on Namenda.   Hyperlipidemia: Continue Zetia, fenofibrate, and Crestor   GERD: Hold PPI due to prolonged QT interval   OSA on CPAP: Continue CPAP per RT  I have seen and examined this patient myself. I have spent 34 minutes in his evaluation and care.   DVT prophylaxis: SCDs Code Status: Full  Family Communication: Wife updated over the phone  Disposition Plan: The patient is from home. Anticipate discharge to home. Barriers to discharge: Plan for EGD and Colonoscopy on 10/11/2019. Plan for [facility] by cardiology when cleared by GI. Need to monitor hemoglobin and telemetry closely. Prognosis is guarded.  Ava Swayze, DO [facility] Direct contact: see [email or web address]  7PM-7AM contact night coverage as above 11/01/2019, 3:53 PM  LOS: 1 day

## 2019-11-02 NOTE — Progress Notes (Signed)
Pt refused cpap currently. VSS currently, sat 95% on 2 lpm [place]

## 2019-11-02 NOTE — H&P (View-Only) (Signed)
          Daily Rounding Note  10/31/2019, 8:33 AM  LOS: 4 days   SUBJECTIVE:   Chief complaint: anemia.  FOBT +.  Chest pressure/pain    Still w chest pressure, despite IV NTG.  Pain wrse with PO, though not clearly odynophagia.  Chest pain was gone but returned once pt found out he would need the mag citrate.   resp wise says same dyspnea as always.   Stools watery, brown, not clear, no blood so bottle mag citrate ordered along w TW enema.    OBJECTIVE:         Vital signs in last 24 hours:    Temp:  [97.6 F (36.4 C)-98.7 F (37.1 C)] 98.7 F (37.1 C) (05/24 0803) Pulse Rate:  [62-114] 82 (05/24 0803) Resp:  [18-23] 20 (05/24 0803) BP: (96-135)/(63-101) 112/78 (05/24 0803) SpO2:  [93 %-100 %] 93 % (05/24 0803) Weight:  [123.3 kg] 123.3 kg (05/24 0451) Last BM Date: 10/30/19 Filed Weights   10/31/19 0350 11/01/19 0434 11/08/2019 0451  Weight: 123.9 kg 124.3 kg 123.3 kg   General: anxious, comfortable.  Looks chronically ill.    Heart: RRR Chest: clear bil though diminished BS bil.   Abdomen: obese, soft, NT, active BS  Extremities: minor, non-pitting LE edema.   Neuro/Psych:  Alert, oriented x 3.  Moves all 4 liimbs.  Anxious.    Intake/Output from previous day: 05/23 0701 - 05/24 0700 In: 796.3 [P.O.:480; I.V.:1.3; Blood:315] Out: 1100 [Urine:1100]  Intake/Output this shift: No intake/output data recorded.  Lab Results: Recent Labs    10/31/19 1030 11/01/19 0326 11/01/19 1656  WBC 8.5 7.3  --   HGB 8.4* 8.1* 9.6*  HCT 27.9* 27.2* 31.5*  PLT 277 261  --    BMET Recent Labs    10/31/19 1030 11/01/19 0326  NA 139 133*  K 5.1 4.5  CL 103 102  CO2 26 25  GLUCOSE 218* 204*  BUN 19 22  CREATININE 2.08* 2.06*  CALCIUM 9.0 8.8*   LFT No results for input(s): PROT, ALBUMIN, AST, ALT, ALKPHOS, BILITOT, BILIDIR, IBILI in the last 72 hours. PT/INR No results for input(s): LABPROT, INR in the last 72  hours. Hepatitis Panel No results for input(s): HEPBSAG, HCVAB, HEPAIGM, HEPBIGM in the last 72 hours.  Studies/Results: DG CHEST PORT 1 VIEW  Result Date: 11/01/2019 CLINICAL DATA:  Chest pain EXAM: PORTABLE CHEST 1 VIEW COMPARISON:  10/24/2019 FINDINGS: Cardiac shadow is enlarged but stable. Postsurgical changes are again seen. Lungs are well aerated bilaterally. Mild scarring is noted in the right base. No acute bony abnormality is seen. IMPRESSION: No acute abnormality noted. Electronically Signed   By: Inez Catalina M.D.   On: 11/01/2019 16:28    ASSESMENT:   *  FOBT +.  Anemia, drop Hgb over several months (no interim levels).  No melena, BPR etc Taking PO iron for a long time.    *   Non-exertional chest pain, not well relieved w NTG.  Has GI component.   *   Depression, anxiety, multiple psych meds.    *    Chronic constipation.  Oxycodone, iron and other meds signif contributors.      PLAN   *   3536 colon/egd today.    *   Ordered single dose 0.5 mg Ativan po now.      Azucena Freed  11/08/2019, 8:33 AM Phone [phone]

## 2019-11-02 NOTE — Progress Notes (Signed)
Called by nurse regarding chest pain He has had this off/on since admission  No acute ECG changes troponin has come down from 4646-> 882 He is not a cath candidate due to GI bleed and plavix /eliquis have been held Even if we did cath he could not have intervention until sight / etiology of GI  Bleed found Hct only 27  Jenkins Rouge MD [facility]

## 2019-11-02 NOTE — Progress Notes (Signed)
Progress Note  Patient Name: Matthew [name] Date of Encounter: 10/20/2019  Primary Cardiologist: Larae Grooms, MD   Subjective   Rough night with bowel prep. Says he gets chest pain only when he is excited   Inpatient Medications    Scheduled Meds: . sodium chloride   Intravenous Once  . buPROPion  300 mg Oral Daily  . dronedarone  400 mg Oral BID WC  . ezetimibe  5 mg Oral QHS  . ferrous sulfate  325 mg Oral Q M,W,F  . finasteride  5 mg Oral QHS  . gabapentin  300 mg Oral BID  . insulin aspart  0-15 Units Subcutaneous TID WC  . insulin aspart  0-5 Units Subcutaneous QHS  . insulin detemir  25 Units Subcutaneous BID  . levothyroxine  150 mcg Oral QAC breakfast  . magnesium oxide  400 mg Oral BID  . melatonin  3 mg Oral QHS  . metoprolol succinate  12.5 mg Oral Daily  . midodrine  2.5 mg Oral TID WC  . nortriptyline  50 mg Oral QHS  . rosuvastatin  40 mg Oral QHS  . senna  2 tablet Oral q AM   And  . senna  1 tablet Oral QHS  . sertraline  25 mg Oral BH-q7a  . tamsulosin  0.4 mg Oral Daily  . traZODone  100 mg Oral QHS   Continuous Infusions: . nitroGLYCERIN 10 mcg/min (11/01/19 2228)   PRN Meds: acetaminophen, alum & mag hydroxide-simeth, lidocaine, morphine injection, nitroGLYCERIN, oxyCODONE-acetaminophen **AND** oxyCODONE, polyethylene glycol, triamcinolone cream   Vital Signs    Vitals:   11/01/19 1459 11/01/19 2017 10/30/2019 0022 11/03/2019 0451  BP: 113/79 116/82 100/65 127/86  Pulse: 62 77 94 88  Resp: 20 18 (!) 21 (!) 23  Temp: 98 F (36.7 C) 97.6 F (36.4 C) 97.9 F (36.6 C) 98.2 F (36.8 C)  TempSrc: Oral Oral Oral Oral  SpO2:  100% 98% 100%  Weight:    123.3 kg  Height:        Intake/Output Summary (Last 24 hours) at 10/28/2019 0815 Last data filed at 11/01/2019 2230 Gross per 24 hour  Intake 796.32 ml  Output 1100 ml  Net -303.68 ml   Last 3 Weights 10/26/2019 11/01/2019 10/31/2019  Weight (lbs) 271 lb 14.4 oz 274 lb 1.6 oz 273 lb  2.4 oz  Weight (kg) 123.333 kg 124.331 kg 123.9 kg      Telemetry    Atrial fibrillation, variable rates 10/14/2019   ECG    Atrial fibrillation nonspecific ST changes  11/01/19   Physical Exam   Affect appropriate Obese male  HEENT: normal Neck supple with no adenopathy JVP normal no bruits no thyromegaly Lungs clear with no wheezing and good diaphragmatic motion Heart:  S1/S2 no murmur, no rub, gallop or click PMI normal post sternotomy  Abdomen: benighn, BS positve, no tenderness, no AAA no bruit.  No HSM or HJR Distal pulses intact with no bruits No edema Neuro non-focal Skin warm and dry No muscular weakness   Labs    High Sensitivity Troponin:   Recent Labs  Lab 11/01/2019 0725 10/28/2019 1344 10/12/2019 1702 11/01/19 1042 11/01/19 1205  TROPONINIHS 98* 2,635* 4,646* 1,042* 882*      Chemistry Recent Labs  Lab 10/30/19 0400 10/31/19 1030 11/01/19 0326  NA 137 139 133*  K 4.3 5.1 4.5  CL 101 103 102  CO2 27 26 25   GLUCOSE 185* 218* 204*  BUN 19 19 22  CREATININE 1.96* 2.08* 2.06*  CALCIUM 9.0 9.0 8.8*  GFRNONAA 32* 30* 31*  GFRAA 38* 35* 35*  ANIONGAP 9 10 6      Hematology Recent Labs  Lab 11/04/2019 0453 10/22/2019 0725 10/30/19 0400 10/30/19 0400 10/31/19 1030 11/01/19 0326 11/01/19 1656  WBC 9.0  --   --   --  8.5 7.3  --   RBC 3.03*  --  3.15*  --  2.87* 2.85*  --   HGB 8.8*   < > 9.3*   < > 8.4* 8.1* 9.6*  HCT 29.7*   < > 30.7*   < > 27.9* 27.2* 31.5*  MCV 98.0  --   --   --  97.2 95.4  --   MCH 29.0  --   --   --  29.3 28.4  --   MCHC 29.6*  --   --   --  30.1 29.8*  --   RDW 14.3  --   --   --  14.5 14.4  --   PLT 347  --   --   --  277 261  --    < > = values in this interval not displayed.     Radiology    DG CHEST PORT 1 VIEW  Result Date: 11/01/2019 CLINICAL DATA:  Chest pain EXAM: PORTABLE CHEST 1 VIEW COMPARISON:  10/15/2019 FINDINGS: Cardiac shadow is enlarged but stable. Postsurgical changes are again seen. Lungs are  well aerated bilaterally. Mild scarring is noted in the right base. No acute bony abnormality is seen. IMPRESSION: No acute abnormality noted. Electronically Signed   By: Inez Catalina M.D.   On: 11/01/2019 16:28    Cardiac Studies   Echo 10/30/19:  1. Left ventricular ejection fraction, by estimation, is 50 to 55%. The  left ventricle has low normal function. Left ventricular endocardial  border not optimally defined to evaluate regional wall motion. There is  moderate left ventricular hypertrophy.  Left ventricular diastolic parameters are indeterminate. Elevated left  ventricular end-diastolic pressure.  2. Right ventricular systolic function is mildly reduced. The right  ventricular size is moderately enlarged. There is moderately elevated  pulmonary artery systolic pressure.  3. The mitral valve is degenerative. Trivial mitral valve regurgitation.  No evidence of mitral stenosis.  4. The aortic valve is abnormal. Aortic valve regurgitation is not  visualized. Aortic valve mean gradient measures 7.0 mmHg. Aortic valve has  moderately reduced cusp mobility, suggesting gradient may be  underestimated, with at least mild aortic valve  stenosis.  5. The inferior vena cava is normal in size with greater than 50%  respiratory variability, suggesting right atrial pressure of 3 mmHg.    Cardiac cath 10/15/18   Prox RCA lesion is 100% stenosed.  SVG graft was visualized by angiography and is normal in caliber.  Prox Cx to Mid Cx lesion is 100% stenosed.  SVG graft was visualized by angiography and is normal in caliber.  Ost LAD to Prox LAD lesion is 100% stenosed.  LIMA graft was visualized by angiography and is normal in caliber.  SVG graft was not injected.  Mid Graft lesion before 2nd Mrg is 99% stenosed.  A drug-eluting stent was successfully placed using a STENT RESOLUTE [id number].  Post intervention, there is a 0% residual stenosis.  1. Severe triple vessel CAD  s/p 5V CABG with 4/5 patent grafts 2. Chronic occlusion proximal LAD. Patent LIMA to mid LAD. Chronic occlusion of the vein graft to the Diagonal branch (not  injected-known occlusion) 3. Chronic occlusion mid Circumflex. Patent sequential vein graft to OM2/OM3. Severe stenosis mid body of SVG to OM2/OM3 within the old stent 4. Successful PTCA/DES x 1 mid body of SVG to OM2/OM3 5. Chronic occlusion proximal RCA. Patent SVG to distal RCA  Recommendations: ASA and Plavix along with Eliquis for one month. Would stop ASA at one month and continue Plavix along with Eliquis for at least one year. Continue statin and beta blocker. Resume Eliquis tomorrow.    Patient Profile     74 y.o. male with a hx of coronary artery disease, CABG, hypertension, hyperlipidemia, paroxysmal atrial fibrillation, hypothyroidism, CKD who presented 11/07/2019 with chest pain and was found to have a GI bleedand  elevated troponins, elevated Cr and acute anemia with drop in Hgb from 16 to 8.  Heme+ stools. Eliquis and Plavix held.   Assessment & Plan    1.  Non-STEMI/CAD: Troponin coming down no acute ST changes Not a candidate for cath or intervention with GI bleed Plavix held will likely risk stratify as outpatient with myovue Possible culprit is stent in SVG to OM   2.  Acute GI bleed: Clopidogrel and apixaban on hold.  Plans for EGD and colonoscopy today   Hb improved to 9.6 yesterday . Post transfusion   3.  Paroxysmal atrial fibrillation: Anticoagulation on hold.  Continue Multaq and beta blocker   4.  Hypertension: BP fluctuates with low normal readings today and up to 130/86 when I evaluated him.  5.  Hyperlipidemia: On rosuvastatin.  6.  Acute kidney injury: Creatinine 2.06.    For questions or updates, please contact [place] Please consult [email or web address] for contact info under        Signed, Jenkins Rouge, MD  10/30/2019, 8:15 AM

## 2019-11-02 NOTE — Anesthesia Procedure Notes (Signed)
Procedure Name: MAC Date/Time: 10/24/2019 3:16 PM Performed by: Alain Marion, CRNA Pre-anesthesia Checklist: Patient identified, Emergency Drugs available, Suction available and Patient being monitored Oxygen Delivery Method: Nasal cannula Placement Confirmation: positive ETCO2

## 2019-11-02 NOTE — Progress Notes (Signed)
Please see Anesthesia notation in regards to patient's clinical status in the last few hours. Increased risk of potential complications due to the patient's clinical status and heart rate which is being treated at this time. Anesthesia has spoken with Cardiology to ensure clinical optimization as much as possible prior to planned EGD/Colonoscopy. Appreciate great care by Anesthesia to try and optimize patient before case later today if possible. Patient aware of the increased risks as we had discussed earlier in the day. If no overt source of Iron deficiency is found on today's examination, I would not pursue VCE unless he has overt GI bleeding. Any further cardiac workup should then be pursued as necessary. Let us see what we find later today or tomorrow on endoscopic evaluation when he is stable.   Justice Britain, MD [facility] Advanced Endoscopy Office # [phone]

## 2019-11-02 NOTE — Interval H&P Note (Signed)
History and Physical Interval Note:  10/31/2019 1:41 PM  Matthew [name]  has presented today for surgery, with the diagnosis of Anemia.  FOBT positive.  The various methods of treatment have been discussed with the patient and family. After consideration of risks, benefits and other options for treatment, the patient has consented to  Procedure(s): COLONOSCOPY WITH PROPOFOL (N/A) ESOPHAGOGASTRODUODENOSCOPY (EGD) WITH PROPOFOL (N/A) as a surgical intervention.  The patient's history has been reviewed, patient examined, no change in status, stable for surgery.  I have reviewed the patient's chart and labs.  Questions were answered to the patient's satisfaction.     [employer]

## 2019-11-02 NOTE — Transfer of Care (Signed)
Immediate Anesthesia Transfer of Care Note  Patient: Matthew [name]  Procedure(s) Performed: COLONOSCOPY WITH PROPOFOL (N/A ) ESOPHAGOGASTRODUODENOSCOPY (EGD) WITH PROPOFOL (N/A ) SUBMUCOSAL LIFTING INJECTION HEMOSTASIS CLIP PLACEMENT HOT HEMOSTASIS (ARGON PLASMA COAGULATION/BICAP) (N/A ) HEMOSTASIS CONTROL  Patient Location: Endoscopy Unit  Anesthesia Type:MAC  Level of Consciousness: awake, alert  and oriented  Airway & Oxygen Therapy: Patient Spontanous Breathing and Patient connected to face mask oxygen  Post-op Assessment: Report given to RN and Post -op Vital signs reviewed and stable  Post vital signs: Reviewed and stable  Last Vitals:  Vitals Value Taken Time  BP 86/56 10/23/2019 1637  Temp    Pulse 84 10/21/2019 1639  Resp 21 10/22/2019 1639  SpO2 100 % 10/15/2019 1639  Vitals shown include unvalidated device data.  Last Pain:  Vitals:   11/08/2019 1250  TempSrc:   PainSc: 0-No pain      Patients Stated Pain Goal: 0 (68/11/57 2620)  Complications: No apparent anesthesia complications

## 2019-11-03 ENCOUNTER — Inpatient Hospital Stay (HOSPITAL_COMMUNITY): Payer: 59

## 2019-11-03 DIAGNOSIS — D649 Anemia, unspecified: Secondary | ICD-10-CM

## 2019-11-03 DIAGNOSIS — Z7902 Long term (current) use of antithrombotics/antiplatelets: Secondary | ICD-10-CM

## 2019-11-03 DIAGNOSIS — Z7901 Long term (current) use of anticoagulants: Secondary | ICD-10-CM

## 2019-11-03 DIAGNOSIS — Z5181 Encounter for therapeutic drug level monitoring: Secondary | ICD-10-CM

## 2019-11-03 DIAGNOSIS — R195 Other fecal abnormalities: Secondary | ICD-10-CM

## 2019-11-03 LAB — CBC WITH DIFFERENTIAL/PLATELET
Abs Immature Granulocytes: 0.07 10*3/uL (ref 0.00–0.07)
Abs Immature Granulocytes: 0.1 10*3/uL — ABNORMAL HIGH (ref 0.00–0.07)
Basophils Absolute: 0 10*3/uL (ref 0.0–0.1)
Basophils Absolute: 0.1 10*3/uL (ref 0.0–0.1)
Basophils Relative: 0 %
Basophils Relative: 1 %
Eosinophils Absolute: 0.1 10*3/uL (ref 0.0–0.5)
Eosinophils Absolute: 0.2 10*3/uL (ref 0.0–0.5)
Eosinophils Relative: 1 %
Eosinophils Relative: 1 %
HCT: 28.6 % — ABNORMAL LOW (ref 39.0–52.0)
HCT: 29.6 % — ABNORMAL LOW (ref 39.0–52.0)
Hemoglobin: 8.6 g/dL — ABNORMAL LOW (ref 13.0–17.0)
Hemoglobin: 8.8 g/dL — ABNORMAL LOW (ref 13.0–17.0)
Immature Granulocytes: 1 %
Immature Granulocytes: 1 %
Lymphocytes Relative: 16 %
Lymphocytes Relative: 15 %
Lymphs Abs: 1.5 10*3/uL (ref 0.7–4.0)
Lymphs Abs: 1.9 10*3/uL (ref 0.7–4.0)
MCH: 29 pg (ref 26.0–34.0)
MCH: 28.9 pg (ref 26.0–34.0)
MCHC: 30.1 g/dL (ref 30.0–36.0)
MCHC: 29.7 g/dL — ABNORMAL LOW (ref 30.0–36.0)
MCV: 96.3 fL (ref 80.0–100.0)
MCV: 97.4 fL (ref 80.0–100.0)
Monocytes Absolute: 0.9 10*3/uL (ref 0.1–1.0)
Monocytes Absolute: 1.3 10*3/uL — ABNORMAL HIGH (ref 0.1–1.0)
Monocytes Relative: 9 %
Monocytes Relative: 10 %
Neutro Abs: 7.3 10*3/uL (ref 1.7–7.7)
Neutro Abs: 9 10*3/uL — ABNORMAL HIGH (ref 1.7–7.7)
Neutrophils Relative %: 73 %
Neutrophils Relative %: 72 %
Platelets: 265 10*3/uL (ref 150–400)
Platelets: 303 10*3/uL (ref 150–400)
RBC: 2.97 MIL/uL — ABNORMAL LOW (ref 4.22–5.81)
RBC: 3.04 MIL/uL — ABNORMAL LOW (ref 4.22–5.81)
RDW: 15.4 % (ref 11.5–15.5)
RDW: 15.5 % (ref 11.5–15.5)
WBC: 9.9 10*3/uL (ref 4.0–10.5)
WBC: 12.5 10*3/uL — ABNORMAL HIGH (ref 4.0–10.5)
nRBC: 0 % (ref 0.0–0.2)
nRBC: 0.3 % — ABNORMAL HIGH (ref 0.0–0.2)

## 2019-11-03 LAB — CBC
HCT: 32 % — ABNORMAL LOW (ref 39.0–52.0)
Hemoglobin: 9.7 g/dL — ABNORMAL LOW (ref 13.0–17.0)
MCH: 29.8 pg (ref 26.0–34.0)
MCHC: 30.3 g/dL (ref 30.0–36.0)
MCV: 98.2 fL (ref 80.0–100.0)
Platelets: 316 10*3/uL (ref 150–400)
RBC: 3.26 MIL/uL — ABNORMAL LOW (ref 4.22–5.81)
RDW: 15.4 % (ref 11.5–15.5)
WBC: 12.7 10*3/uL — ABNORMAL HIGH (ref 4.0–10.5)
nRBC: 0.2 % (ref 0.0–0.2)

## 2019-11-03 LAB — BASIC METABOLIC PANEL
Anion gap: 12 (ref 5–15)
Anion gap: 11 (ref 5–15)
BUN: 16 mg/dL (ref 8–23)
BUN: 19 mg/dL (ref 8–23)
CO2: 24 mmol/L (ref 22–32)
CO2: 24 mmol/L (ref 22–32)
Calcium: 8.6 mg/dL — ABNORMAL LOW (ref 8.9–10.3)
Calcium: 8.6 mg/dL — ABNORMAL LOW (ref 8.9–10.3)
Chloride: 100 mmol/L (ref 98–111)
Chloride: 99 mmol/L (ref 98–111)
Creatinine, Ser: 1.93 mg/dL — ABNORMAL HIGH (ref 0.61–1.24)
Creatinine, Ser: 2.52 mg/dL — ABNORMAL HIGH (ref 0.61–1.24)
GFR calc Af Amer: 38 mL/min — ABNORMAL LOW (ref >60)
GFR calc Af Amer: 28 mL/min — ABNORMAL LOW (ref >60)
GFR calc non Af Amer: 33 mL/min — ABNORMAL LOW (ref >60)
GFR calc non Af Amer: 24 mL/min — ABNORMAL LOW (ref >60)
Glucose, Bld: 191 mg/dL — ABNORMAL HIGH (ref 70–99)
Glucose, Bld: 125 mg/dL — ABNORMAL HIGH (ref 70–99)
Potassium: 4.2 mmol/L (ref 3.5–5.1)
Potassium: 4.8 mmol/L (ref 3.5–5.1)
Sodium: 136 mmol/L (ref 135–145)
Sodium: 134 mmol/L — ABNORMAL LOW (ref 135–145)

## 2019-11-03 LAB — GLUCOSE, CAPILLARY
Glucose-Capillary: 147 mg/dL — ABNORMAL HIGH (ref 70–99)
Glucose-Capillary: 206 mg/dL — ABNORMAL HIGH (ref 70–99)
Glucose-Capillary: 104 mg/dL — ABNORMAL HIGH (ref 70–99)
Glucose-Capillary: 192 mg/dL — ABNORMAL HIGH (ref 70–99)

## 2019-11-03 LAB — BLOOD GAS, ARTERIAL
Acid-Base Excess: 1 mmol/L (ref 0.0–2.0)
Bicarbonate: 24.6 mmol/L (ref 20.0–28.0)
Drawn by: 519031
FIO2: 40
O2 Saturation: 95.8 %
Patient temperature: 36.8
pCO2 arterial: 36.1 mmHg (ref 32.0–48.0)
pH, Arterial: 7.449 (ref 7.350–7.450)
pO2, Arterial: 79.2 mmHg — ABNORMAL LOW (ref 83.0–108.0)

## 2019-11-03 LAB — TSH: TSH: 0.683 u[IU]/mL (ref 0.350–4.500)

## 2019-11-03 LAB — LACTIC ACID, PLASMA: Lactic Acid, Venous: 1.7 mmol/L (ref 0.5–1.9)

## 2019-11-03 LAB — AMMONIA: Ammonia: 20 umol/L (ref 9–35)

## 2019-11-03 MED ORDER — FLUTICASONE PROPIONATE 50 MCG/ACT NA SUSP
2.0000 | Freq: Every day | NASAL | Status: DC
  Administered 2019-11-04 – 2019-11-11 (×9): 2 via NASAL
  Filled 2019-11-03: qty 16

## 2019-11-03 MED ORDER — METOPROLOL TARTRATE 12.5 MG HALF TABLET
50.0000 mg | ORAL_TABLET | Freq: Three times a day (TID) | ORAL | Status: DC
  Administered 2019-11-03 – 2019-11-05 (×7): 50 mg via ORAL
  Filled 2019-11-03 (×8): qty 1

## 2019-11-03 MED ORDER — SODIUM CHLORIDE 0.9 % IV SOLN: INTRAVENOUS | Status: AC

## 2019-11-03 MED ORDER — FENTANYL CITRATE (PF) 100 MCG/2ML IJ SOLN
12.5000 ug | Freq: Once | INTRAMUSCULAR | Status: AC
  Administered 2019-11-04: 12.5 ug via INTRAVENOUS
  Filled 2019-11-03: qty 2

## 2019-11-03 MED ORDER — SODIUM CHLORIDE 0.9% FLUSH
3.0000 mL | Freq: Two times a day (BID) | INTRAVENOUS | Status: DC
  Administered 2019-11-04 – 2019-11-09 (×8): 3 mL via INTRAVENOUS

## 2019-11-03 MED ORDER — SIMETHICONE 80 MG PO CHEW
80.0000 mg | CHEWABLE_TABLET | Freq: Four times a day (QID) | ORAL | Status: DC | PRN
  Administered 2019-11-05: 80 mg via ORAL
  Filled 2019-11-03 (×2): qty 1

## 2019-11-03 MED ORDER — ALUM & MAG HYDROXIDE-SIMETH 200-200-20 MG/5ML PO SUSP
30.0000 mL | ORAL | Status: DC | PRN
  Administered 2019-11-03 – 2019-11-05 (×2): 30 mL via ORAL
  Filled 2019-11-03 (×2): qty 30

## 2019-11-03 MED ORDER — SALINE SPRAY 0.65 % NA SOLN
1.0000 | NASAL | Status: DC | PRN
  Filled 2019-11-03: qty 44

## 2019-11-03 NOTE — Progress Notes (Signed)
Called to pt's room by patient's wife and nurse tech. When arrived to room, pt had pulled off his leads and pulse ox and complaining he couldn't breathe. After a moment pt became unresponsive and staring off in the distance. Pt then became responsive and answered questions appropriately. Vital signs were BP 104/63 HR 59 Resp 20 Temp 98.2. Rapid response was called to bedside. An EKG was done, CBG was 192, pt showed 706 ml in bladder per bladder scan. Received an order from Hospitalist to I&O cath. Removed 1000 cc from bladder. Notified on call Hospitalist of of this situation and that pt is also complaining of stomach pain. Received and order for Maalox.

## 2019-11-03 NOTE — TOC Initial Note (Signed)
Transition of Care [facility]) - Initial/Assessment Note    Patient Details  Name: Matthew [name] MRN: [id number] Date of Birth: 1945-10-13  Transition of Care [facility]) CM/SW Contact:    Bethena Roys, RN Phone Number: 11/03/2019, 11:04 AM  Clinical Narrative: High risk for readmission assessment completed. Patient presented for chest pain. Prior to arrival patient was from home with the support of spouse. Patient states he still drives and gets to all of his appointments. Patient gets his medications via [facility]-100% service connected. Patient uses local [facility] Oppen[facility] at [facility] as well. Patient states he has used home health services in the past-unable to recall the agency name. Case Manager will continue to follow for additional transition of care needs.                  Expected Discharge Plan: [place] Barriers to Discharge: Continued Medical Work up   Patient Goals and CMS Choice Patient states their goals for this hospitalization and ongoing recovery are:: to return home      Expected Discharge Plan and Services Expected Discharge Plan: [place] In-house Referral: NA Discharge Planning Services: CM Consult Post Acute Care Choice: [place] arrangements for the past 2 months: Single Family Home(lives in a townhome)                 DME Arranged: N/A  Prior Living Arrangements/Services Living arrangements for the past 2 months: Single Family Home(lives in a townhome) Lives with:: Spouse Patient language and need for interpreter reviewed:: Yes Do you feel safe going back to the place where you live?: Yes      Need for Family Participation in Patient Care: Yes (Comment) Care giver support system in place?: Yes (comment)   Criminal Activity/Legal Involvement Pertinent to Current Situation/Hospitalization: No - Comment as needed  Activities of Daily Living Home Assistive  Devices/Equipment: Cane (specify quad or straight), Walker (specify type) ADL Screening (condition at time of admission) Patient's cognitive ability adequate to safely complete daily activities?: Yes Is the patient deaf or have difficulty hearing?: No Does the patient have difficulty seeing, even when wearing glasses/contacts?: No Does the patient have difficulty concentrating, remembering, or making decisions?: No Patient able to express need for assistance with ADLs?: Yes Does the patient have difficulty dressing or bathing?: No Independently performs ADLs?: Yes (appropriate for developmental age) Does the patient have difficulty walking or climbing stairs?: Yes Weakness of Legs: Both Weakness of Arms/Hands: None   Emotional Assessment Appearance:: Appears stated age Attitude/Demeanor/Rapport: Engaged Affect (typically observed): Appropriate Orientation: : Oriented to Situation, Oriented to  Time, Oriented to Place, Oriented to Self Alcohol / Substance Use: Not Applicable Psych Involvement: No (comment)  Admission diagnosis:  Elevated troponin [R77.8] Chest pain [R07.9] Gastrointestinal hemorrhage, unspecified gastrointestinal hemorrhage type [K92.2] Chest pain in adult [R07.9] Patient Active Problem List   Diagnosis Date Noted  . Blood in stool   . Hyperlipidemia LDL goal <70   . AKI (acute kidney injury) ([place])   . Prolonged QT interval 10/27/2019  . Depression 11/07/2019  . GI bleed 10/16/2019  . Acute blood loss anemia 10/18/2019  . Episodic lightheadedness 09/29/2019  . Other headache syndrome 09/29/2019  . Atrial fibrillation ([place]) 08/22/2019  . Ischemic chest pain ([place]) 10/14/2018  . OSA (obstructive sleep apnea) 08/22/2018  . Type 2 diabetes mellitus with vascular disease ([place]) 07/29/2017  . Post concussion syndrome 03/13/2017  . Post-concussion headache 03/13/2017  .  Neck pain 03/13/2017  . Insomnia 03/13/2017  . Diabetic polyneuropathy associated with type 2  diabetes mellitus ([place]) 03/13/2017  . Unstable angina ([place])   . Non-ST elevation (NSTEMI) myocardial infarction ([place]) 10/08/2016  . Atrial fibrillation with RVR ([place]) 01/26/2016  . Atrial fibrillation with rapid ventricular response ([place]) 01/26/2016  . PAF (paroxysmal atrial fibrillation) ([place]) 12/21/2013  . Chronic abdominal pain 12/21/2013  . CKD (chronic kidney disease), stage III 12/21/2013  . H/O: lung cancer 12/21/2013  . LV dysfunction 12/21/2013  . RBBB   . LAFB (left anterior fascicular block)   . AAA (abdominal aortic aneurysm) ([place])   . Chest pain 12/20/2013  . Mixed hyperlipidemia 10/14/2013  . Obesity, unspecified 10/14/2013  . Right Empyema    .  Left upper lobe lesion or nodule   . Hypertension   . CAD (coronary artery disease)   . Renal cell carcinoma ([place])   . [place]   . Paroxysmal supraventricular tachycardia ([place])   . Hypothyroidism    PCP:  Marda Stalker, PA-C Pharmacy:   [facility], [address] [place] [phone] [place] [address] Phone: [phone] Fax: [phone]  Readmission Risk Interventions Readmission Risk Prevention Plan 11/03/2019  Transportation Screening Complete  HRI or Home Care Consult Complete  Social Work Consult for [place] Planning/Counseling Complete  Palliative Care Screening Not Applicable  Medication Review Press photographer) Complete  Some recent data might be hidden

## 2019-11-03 NOTE — Plan of Care (Signed)
  Problem: Education: Goal: Knowledge of General Education information will improve Description: Including pain rating scale, medication(s)/side effects and non-pharmacologic comfort measures Outcome: Progressing   Problem: Activity: Goal: Ability to tolerate increased activity will improve Outcome: Progressing   

## 2019-11-03 NOTE — Progress Notes (Signed)
Pt's BP 89/59. MD notified. Bolus ordered. Pt being seen by rapid RN. New orders placed and pt going to CT scan accompanied by rapid and this RN.

## 2019-11-03 NOTE — Progress Notes (Signed)
Daily Rounding Note  11/03/2019, 10:44 AM  LOS: 5 days   SUBJECTIVE:   Chief complaint:  FOBT + anemia. Gastric and colon polyps. Non-anginal chest pain.      Continues to c/o non-cardiac pattern chest pain. Tolerating clears.  No BM's since bowel prep.  Normally takes 2 sennokot bid at home, without this will no have BM   OBJECTIVE:         Vital signs in last 24 hours:    Temp:  [97.7 F (36.5 C)-98.8 F (37.1 C)] 98.1 F (36.7 C) (05/25 0834) Pulse Rate:  [74-107] 107 (05/25 0834) Resp:  [17-27] 18 (05/25 0459) BP: (85-121)/(46-85) 118/71 (05/25 0834) SpO2:  [93 %-100 %] 99 % (05/25 0834) Weight:  [125.5 kg] 125.5 kg (05/25 0338) Last BM Date: 10/27/2019 Filed Weights   11/01/19 0434 11/04/2019 0451 11/03/19 0338  Weight: 124.3 kg 123.3 kg 125.5 kg   General: looks chronically ill   Heart: RRR Chest: labored breathing w speaking Abdomen: obese, NT, soft, active BS  Extremities: no CCE Neuro/Psych:  Oriented x 3.  Moves all 4 limbs.  Fluid speech.    Intake/Output from previous day: 05/24 0701 - 05/25 0700 In: 1501.2 [P.O.:480; I.V.:521.2; IV Piggyback:500] Out: 400 [Urine:400]  Intake/Output this shift: No intake/output data recorded.  Lab Results: Recent Labs    11/01/19 0326 11/01/19 1656 11/03/19 0401  WBC 7.3  --  9.9  HGB 8.1* 9.6* 8.6*  HCT 27.2* 31.5* 28.6*  PLT 261  --  265   BMET Recent Labs    11/01/19 0326 11/03/19 0401  NA 133* 136  K 4.5 4.2  CL 102 100  CO2 25 24  GLUCOSE 204* 191*  BUN 22 16  CREATININE 2.06* 1.93*  CALCIUM 8.8* 8.6*   LFT No results for input(s): PROT, ALBUMIN, AST, ALT, ALKPHOS, BILITOT, BILIDIR, IBILI in the last 72 hours. PT/INR No results for input(s): LABPROT, INR in the last 72 hours. Hepatitis Panel No results for input(s): HEPBSAG, HCVAB, HEPAIGM, HEPBIGM in the last 72 hours.  Studies/Results: DG CHEST PORT 1 VIEW  Result Date:  11/01/2019 CLINICAL DATA:  Chest pain EXAM: PORTABLE CHEST 1 VIEW COMPARISON:  11/04/2019 FINDINGS: Cardiac shadow is enlarged but stable. Postsurgical changes are again seen. Lungs are well aerated bilaterally. Mild scarring is noted in the right base. No acute bony abnormality is seen. IMPRESSION: No acute abnormality noted. Electronically Signed   By: Inez Catalina M.D.   On: 11/01/2019 16:28   Scheduled Meds: . sodium chloride   Intravenous Once  . buPROPion  300 mg Oral Daily  . dronedarone  400 mg Oral BID WC  . ezetimibe  5 mg Oral QHS  . ferrous sulfate  325 mg Oral Q M,W,F  . finasteride  5 mg Oral QHS  . gabapentin  300 mg Oral BID  . insulin aspart  0-15 Units Subcutaneous TID WC  . insulin aspart  0-5 Units Subcutaneous QHS  . insulin detemir  25 Units Subcutaneous BID  . levothyroxine  150 mcg Oral QAC breakfast  . magnesium oxide  400 mg Oral BID  . melatonin  3 mg Oral QHS  . metoprolol tartrate  50 mg Oral TID  . midodrine  2.5 mg Oral TID WC  . nortriptyline  50 mg Oral QHS  . [START ON 11/04/2019] pantoprazole  40 mg Oral BID AC  . rosuvastatin  40 mg Oral QHS  . senna  2 tablet Oral q AM   And  . senna  1 tablet Oral QHS  . sertraline  25 mg Oral BH-q7a  . sodium chloride flush  3 mL Intravenous Q12H  . tamsulosin  0.4 mg Oral Daily  . traZODone  100 mg Oral QHS   Continuous Infusions: . diltiazem (CARDIZEM) infusion 5 mg/hr (11/03/19 1155)  . nitroGLYCERIN 20 mcg/min (10/19/2019 1106)   PRN Meds:.acetaminophen, alum & mag hydroxide-simeth, lidocaine, morphine injection, nitroGLYCERIN, oxyCODONE-acetaminophen **AND** oxyCODONE, polyethylene glycol, triamcinolone cream   ASSESMENT:   *   FOBT + anemia.  3 x weekly oral iron.    5/24 EGD: medium HH.  Multiple gastric polyp; 1 larger and oozing blood, 1 smaller with stigmata of recent bleeding: both resected and sites clipped. Gastritis.     5/24 Colonoscopy: non bleeding hemorrhoids. Lavage of yellow/rown stool.   Multiple 2 to 6 mm polyps thruout colon, not resected given small size, blood thinners needed and serious co-morbidities.   Protonix 40 mg po bid in place.    *   Persistent non-anginal chest pain.  Elevated trops.  Cards now planning cardiac cath for Thursday 5/27.   EGD revealed no source for the mid-sternal pain.    *   Chronic constipation.  Bid Senokot in place.    PLAN   *  In future, if pt felt to be candidate, can consider repeat colonoscopy for polyp resection w 2 day prep  Await path report from gastric polyps, await serum H Pylori Ab and   Glial IgA/IgG, tTG IgA (r/o celiac dz)  *   Restart AC (Eliquis) plavix at latest date acceptable to cardiology.   Indications: PAF, CAD.   Likely restart Eliquis ~ 6/1.  Plavix restart after cath??     Azucena Freed  11/03/2019, 10:44 AM Phone [phone]

## 2019-11-03 NOTE — Progress Notes (Signed)
PROGRESS NOTE  Matthew [name] MHD:[id number] DOB: 12-Jul-1945 DOA: 10/19/2019 PCP: Marda Stalker, PA-C  Brief History    Matthew [name] is a 74 y.o. male with medical history significant of HTN, HLD, PAF on Eliquis, CAD s/p CABGx5v and stents, AAA, hypothyroidism, CKD stage III, chronic pain, and GERD who presented with complaints of chest pain.  Patient had been watching the basketball game last night and never really got to sleep with he had acute onset of left-sided chest pain.  He described it as dull pain with radiation down his arm.  With the chest pain he denied having any diaphoresis, nausea, vomiting, or shortness of breath.  Reported having similar symptoms 3 days ago with diaphoresis and nausea, but resolved with nitroglycerin.  Last night when he attempted to take a nitroglycerin he had no relief in symptoms.  Denies any recent change in weight, leg swelling, abdominal pain in, or diarrhea.  Other associated symptoms include chronic shortness of breath unchanged, chronic constipation for which he is on stool softeners, and reported seeing some bright red blood in his stools approximately 3 days ago.  He is not on any NSAIDs, but is on Eliquis and Plavix.  Patient had a left heart cath by Dr. Angelena Form in 10/2018 which showed severe triple-vessel coronary artery disease status post 5 vessel CABG with 4/5 patent grafts with successful DES to mid body of SVG to OM2/OM3.  Patient had prior colonoscopy in 2015 noted 5 sessile polyps which were removed.  Endoscopy from 2016 and multiple gastric polyps and Barrett's esophagus.   Patient was given 324 mg of aspirin in route with EMS   ED Course: Upon admission into the emergency department patient was seen to be afebrile with heart rates elevated up to 154, and all other vital signs maintained.  Labs significant for hemoglobin 8.8, BUN 22, creatinine 2.13, glucose 243, and high-sensitivity troponin 61 which has increased to 98 and then 4646 .   Stool guaiacs were noted to be positive, but not reported to have gross blood on rectal exam.  Chest x-ray showed no acute abnormalities.  Patient was given Tylenol and fentanyl.  TRH called to admit.  The patient has been admitted to a telemetry bed. Cardiology and gastroenterology  have been consulted. Echocardiogram pending. Plavix and Eliquis have been held for now. Evaluation for possible source of GI bleed will need to be completed before cardiology can entertain LHC which will be necessary. Gastroenterology will perform EGD and colonoscopy planned for Monday when plavix has had a chance to wash out. We will monitor Hgb and transfuse for Hgb greater than 8.0.  The patient has been having complaints of chest pain this morning. SL NTG dropped his pressure abruptly. The patient has been evaluated by cardiology. He was given IV metoprolol, SL nitro, and a GI cocktail without improvement He has been placed on nitropaste and an esmolol drip. I have given the patient morphine to address his discomfort. GI cocktail was not helpful. EKG demonstrated AF with RVR. Morphine given for pain. Cardiology has ordered transfusion as Hgb is 8.1 this morning. This correlated with an elevated troponin at 1042 which has trended down to 882. Cardidology is monitoring carefully. The patient needs LHC, but must have EGC, CSPY first. Cardiology has given the patient IV amiodarone to control rate and placed the patient on a nitro drip. They ordered CXR to evaluate the possibility of volume overload. It showed no abnormality. The patient remains on nitroglycerin gtt due to  ongoing chest pain.  This afternoon the patient had an abrupt change in mental status.  He was found to be hypoxic and hypotensive. He was confused and dysarthric as well as agitated. ABG was performed and demonstrated only a respiratory alkalosis. BMP demonstrated worsening renal insufficiency with increase in creatinine from 1.93 to 2.52. he was found to be  hypotensive. CT head demonstrated some changes that could be consistent with infarction, but I have discussed the patient with Dr. Lorraine Lax. He has seen the patient and he does not feel that the patient is having an acute infarct. The patient has been given a bolus and will be placed on continual IV fluids once bolus is completed. Diltiazem and nitro have been held.  Consultants  . Gastroenterology . Cardiology . neurology  Procedures  . EGD and Colonscopy  Antibiotics   Anti-infectives (From admission, onward)   None     Subjective  The patient was complaining of unresolved pain when he was seen this morning shortly after he had been dosed with oxycodone. Earlier this afternoon I saw the patient while on the floor for another reason. I found him to be hypotensive and hypoxic. Oxygen was increased to bring sats up from 83% on 2L to 94% on 4L. IV bolus of NS was given.  Objective   Vitals:  Vitals:   11/03/19 1556 11/03/19 1615  BP: (!) 85/73 (!) 80/67  Pulse: 86   Resp: (!) 27 20  Temp:    SpO2: 93%    Exam:  Constitutional:  . The patient is awake, alert, and oriented x 3. Mild pain from chest discomfort. Respiratory:  . No increased work of breathing. . No wheezes, rales, or rhonchi . No tactile fremitus Cardiovascular:  . Regular rate and rhythm . No murmurs, ectopy, or gallups. . No lateral PMI. No thrills. Abdomen:  . Abdomen is soft, non-tender, non-distended . No hernias, masses, or organomegaly . Normoactive bowel sounds.  Musculoskeletal:  . No cyanosis, clubbing, or edema Skin:  . No rashes, lesions, ulcers . palpation of skin: no induration or nodules Neurologic:  . CN 2-12 intact . Sensation all 4 extremities intact Psychiatric:  . Mental status o Mood, affect appropriate o Orientation to person, place, time  . judgment and insight appear intact  I have personally reviewed the following:   Today's Data  . Vitals, Hemoglobin and  hematocrit  Cardiology Data  . Echocardiogram: Atrial fibrillation with RVR  Scheduled Meds: . sodium chloride   Intravenous Once  . buPROPion  300 mg Oral Daily  . dronedarone  400 mg Oral BID WC  . ezetimibe  5 mg Oral QHS  . ferrous sulfate  325 mg Oral Q M,W,F  . finasteride  5 mg Oral QHS  . gabapentin  300 mg Oral BID  . insulin aspart  0-15 Units Subcutaneous TID WC  . insulin aspart  0-5 Units Subcutaneous QHS  . insulin detemir  25 Units Subcutaneous BID  . levothyroxine  150 mcg Oral QAC breakfast  . magnesium oxide  400 mg Oral BID  . melatonin  3 mg Oral QHS  . metoprolol tartrate  50 mg Oral TID  . midodrine  2.5 mg Oral TID WC  . nortriptyline  50 mg Oral QHS  . [START ON 11/04/2019] pantoprazole  40 mg Oral BID AC  . rosuvastatin  40 mg Oral QHS  . senna  2 tablet Oral q AM   And  . senna  1 tablet Oral  QHS  . sertraline  25 mg Oral BH-q7a  . sodium chloride flush  3 mL Intravenous Q12H  . tamsulosin  0.4 mg Oral Daily  . traZODone  100 mg Oral QHS   Continuous Infusions: . diltiazem (CARDIZEM) infusion Stopped (11/03/19 1437)  . nitroGLYCERIN Stopped (11/03/19 1438)    Principal Problem:   Chest pain Active Problems:   Hypothyroidism   PAF (paroxysmal atrial fibrillation) (HCC)   CKD (chronic kidney disease), stage III   AAA (abdominal aortic aneurysm) (HCC)   Diabetic polyneuropathy associated with type 2 diabetes mellitus (HCC)   OSA (obstructive sleep apnea)   Prolonged QT interval   Depression   GI bleed   Acute blood loss anemia   Hyperlipidemia LDL goal <70   AKI (acute kidney injury) ([place])   Blood in stool   LOS: 5 days   A & P  Chest pain/Elevated troponin/ Known CAD: Acute on chronic.  Patient presented with complaints of chest pain which woke him out of sleep.  Not relieved with nitroglycerin.  Troponin 61->98. Chest x-ray was noted to be clear.  Patient's last heart catheter from 10/2018 with severe triple vessel coronary artery  disease 4/5 patent grafts and had drug-eluting stent placed at the mid body of SVG to OM2/OM3.  Suspect secondary to demand in the setting of acute blood loss. The patient has been admitred to a progressive bed. Troponins have been trended down to 13. Plavix and Eliquis have been held pending GI eval. LHC is anticipated if patient can be cleared by GI after EGD and colonoscopy. Echocardiogram has been performed. It demonstrated EF of 50-55%. There is moderate LVH. Regional wall motion is not well defined. RV systolic function is mildly reduced. RV is moderately enlarged. There is moderately elevated pulmonary artery systolic pressure. Chest pain this morning. Pt is receiving morphine, nitropaste, and IV esmolol. EKG demonstrates AF with RVR. No response to GI cocktail. Pt pain did not respond to dose of oxycodone. Nitroglycerine stopped due to symptomatic hypotension.  GI bleed/Acute blood loss anemia: Acute.  Patient's baseline hemoglobin previously noted to be 15.1 on 08/22/19, but patient presents with hemoglobin down to 8.8.  Stool guaiacs were noted to be positive. Review of records note previous history of gastric as well as sessile polyps on previous EGD and colonoscopy from 2016 and 2015 respectively.  Patient's Eliquis and plavix have been held. Cardiology is aware. Clear liquid diet as tolerated and he has been advanced to a carb modified diet. Hemoglobin will be monitored Q8hours and the patient will be transfused for a hemoglobin of less than 8.0 per cardilogy's recommendation. I appreciate GI consultative services. Hgb is 8.1 this am. He is receiving 1 unit of PRBC's in transfusion. The chest pain correlated with an elevated troponin at 1042 which has trended down to 882. Cardidology is monitoring carefully. The patient needs LHC, but must have EGC, CSPY first. Cardiology has given the patient IV amiodarone to control rate and placed the patient on a nitro drip. Hemoglobin is stable at 9.6 this  morning. EGD demonstrated a medium hiatal hernia, multiple gastric polyps one of which was larger and oozing blood and one smaller one with stigmata of recent bleeding. Both of these were resected and sites clipped. There was also gastritis. Nothing was seen in the upper GI tract that could have been a cause for the patient's ongoing chest pain. Colonoscopy demonstrated non-bleeding hemorrhoids. There were also multiple 2-6 mm polyps throughout the colon. These were not  resected due to their small size and the patient's blood thinners, as well as other serious co-morbidities. Recommendation is to restart the patient's eliquis and plavix at latest date acceptable to cardiology. (6/1? For Eliquis and after cath for plavix).   Paroxysmal atrial fibrillation on chronic anticoagulation: Patient sinus tachycardia with heart rates into the 120s.  CHA2DS2-VASc score = >2. Home medications include Eliquis, dronedarone 400 mg twice daily, and metoprolol 12.5 mg daily. Hold Eliquis due to acute blood loss anemia suspected secondary to GI bleed. Continue metoprolol and dronedarone. Cardiology has initiated an esmolol drip and amiodarone to help control rate. Diltiazem stopped.   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 Last hemoglobin A1c noted to be 7.6 on 08/22/2019.  Home medications include Levemir 50 units twice daily. The patient is on a carbohydrate modified diet and a hypoglycemic protocol. He has been continued on gabapentin and glucoses will be followed with FSBS and SSI. He will be continued on his home dose of lantus, 50 units. Monitior. Glucoses today ran from 137-206.   Prolonged QT interval: QTC elevated up to 532 on admission. Correct any electrolyte abnormalities. Continue to monitor QT interval.   AAA: 3 x 3.2 cm aneurysm of the distal abdominal aorta on CT in 10/2018. Likely needs follow-up surveillance sometime this month.   Hypothyroidism: Continue levothyroxine   Depression: Home  medications include Zoloft 25 mg every morning, Wellbutrin 300 mg daily, nortriptyline 25 mg nightly, and trazodone 100 mg nightly. These have been continued due to the patient's explicit request.   Chronic kidney disease stage IIIb: Creatinine 1.96 which appears near patient's baseline. Increased to 2.52 this afternoon with hypotension. IV fluid bolus given.   Mild memory loss: Patient just recently started on Namenda.   Hyperlipidemia: Continue Zetia, fenofibrate, and Crestor   GERD: Hold PPI due to prolonged QT interval   OSA on CPAP: Continue CPAP per RT  I have seen and examined this patient myself. I have spent 48 minutes in his evaluation and care.   DVT prophylaxis: SCDs Code Status: Full  Family Communication: Wife updated over the phone  Disposition Plan: The patient is from home. Anticipate discharge to home. Barriers to discharge: Plan for EGD and Colonoscopy on 10/18/2019. Plan for [facility] by cardiology when cleared by GI. Need to monitor hemoglobin and telemetry closely. Prognosis is guarded.  Mazi Schuff, DO [facility] Direct contact: see [email or web address]  7PM-7AM contact night coverage as above 11/03/2019, 4:54 PM  LOS: 1 day

## 2019-11-03 NOTE — Code Documentation (Signed)
Patient LKW by RN at 1400. She entered the room at 1415 to find patient confused, restless, and with garbled speech. RN paged MD who put in new orders. Rapid Response was also called and at the bedside. Code Stroke called by Rapid at 7471 after concern for new onset slurred speech and questionable aphasia. Pt taken to CT and met by Neurologist and PA. Dysarthric but more settled and without Aphasia on exam. NIHSS 1. Pt had been hypotensive in room with RN. He was receiving a bolus of fluid by this point. Pt is not VAN positive and "too good to treat" for TPA but still within the window. MD and Stroke RN discussed pt's current hx of GI bleed and weather this would impede his ability to receive TPA anyway. Care Plan: q15 vitals and q30 mNIHSS for 4.5 hours from time of LKW (1400) and a routine MRI. Orders for new labs and an ABG had already been completed. Hand off with Education officer, environmental. Alieyah Spader, Rande Brunt, RN

## 2019-11-03 NOTE — Significant Event (Signed)
Rapid Response Event Note  Overview: Unresponsive event then AMS  Initial Focused Assessment: Notified by Margreta Journey RN of a clinical change with Mr. Seppala. Per pts wife at bedside, pt had a brief unresponsive episode after pulling off medical devices including pulse oximetry. When he woke, reportedly he was acting restless and with poor judgement. Upon my arrival, Mr. Yniguez was sitting up in the bed, pale, diaphoretic and nauseated with wretching. Reportedly the nausea is not new. His neuro exam waxes and wanes with inconsistent presentations. He was able to answer questions appropriately and move all extremities with equal but known weakness. He has some minor dysarthria but due to neuro evaluation earlier today by Dr Aroor this is most likely metabolic in origin. Review of telemetry does not show and ectopic origin and pulse oximetry is consistently 88-92%. Stat EKG shows SR with bifascicular block but no acute STE. CBG 192. Pt has not had any urine output all day, so he attempted to void with only 5cc result. Bladder scan shows 650-700cc residual volume. TRH notified for orders. H/H 9.7/32.   Interventions: -Stat EKG (SR with bifascicular block) -Bladder scan  Plan of Care (if not transferred): -Notify primary svc of events and further orders -Notify primary svc and/or RRRN for further assistance  Event Summary: Call 2006 Arrived 2008 Call ended 2052   Madelynn Done

## 2019-11-03 NOTE — Significant Event (Addendum)
Rapid Response Event Note  Overview: Time Called: 1441 Arrival Time: 4103 Event Type: Other (Comment)(Restlessness, slurred speech)  Initial Focused Assessment: Pt lying in bed, alert & oriented. Pt is pale in appearance. He is able to follow commands and moves all extremities appropriately. Pt is noted to be dysarthric and to have mild aphasia. Pt is restless. Extremities are cool to touch. Pt is mildly diaphoretic. Pulses palpable, 2+. Tachypnea and mild increase work of breathing is noted. Lung sounds are clear. Pt denies pain at this time.   Interventions: -CBG: 104 -EKG -500 cc bolus for hypotension -Code stroke activated 1608- LKN 1400  -ABG, CBC, BMP, CXR, and CT head ordered by MD -Nitro gtt and Cardizem gtt stopped due to hypotension, cardiology and Dr. Benny Lennert made aware.  Plan of Care (if not transferred): -q30 minute mNIHSS until today at 1800, then q12h mNIHSS x12 hours -Titrate supplemental oxygen to achieve oxygen saturation >92% -Reevaluate BP following IVF bolus  Call rapid response for additional needs.   Event Summary: Name of Physician Notified: Dr. Benny Lennert at (Updated at [phone]. MD had been to bedside to evaluate pt prior to my arrival.) Name of Consulting Physician Notified: Dr. Lorraine Lax at 1508 Outcome: Stayed in room and stabalized Event End Time: [place]

## 2019-11-03 NOTE — Significant Event (Signed)
Asked to assess Mr. Soulliere for hypotension. BP 85/60 with HR in the 90-110 range. He is admitted with GI bleed and NSTEMI. He has undergone EGD with multiple bleeding gastric ulcers. EF 50-55% and CXR with concerns for pulmonary edema. His diltiazem is being held and no further nitroglycerin. His confusion has improved and his wife is in the room which I suspect is helping. Mr. 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 No further chest pain.   Overall, I do not feel his hypotension is caused by his Afib and he has no signs of cardiogenic shock (normal pulse pressure, low DBP), or ongoing ischemia. Will send stat CBC to exclude further blood loss anemia and lactic acid. We will allow for lenient rate control (100-130 ok). If his heart rate is an issue, the only option is amiodarone. He is not a candidate for digoxin given AKI. If transfusion is needed, he will need to be give lasix with this to prevent transfusion associated circulatory overload.   Cardiology will follow along. Please call cardiology on call if there are questions or concerns.   [place] T. Audie Box, [place]  [address] [place], [place] [address] [phone]  7:24 PM

## 2019-11-03 NOTE — Consult Note (Addendum)
Neurology Consultation  Reason for Consult: Aphasia code stroke Referring Physician: Benny Lennert  CC: Sudden onset of aphasia along with altered mental status  History is obtained from: Nurse  HPI: Matthew [name] is a 74 y.o. male with history of sleep apnea, postconcussion syndrome, iron deficiency anemia, hypothyroidism, hypertension, hyperlipidemia, chronic kidney disease, CAD, chronic atrial fibrillation on Eliquis.  Patient was admitted to the hospital secondary to fecal occult blood test which was positive.  Patient did have a colonoscopy in which polyps were removed that had evidence of active bleeding or recent bleeding.  Patient is also having chest pain and will undergo a cardiac catheterization later this week.  While in the hospital today patient was noted to be at baseline at 1400 hrs.  When checked on again later he was noted to be very agitated and had dysarthria and possibly aphasia.  Code stroke was called.  Patient remained to have dysarthria on exam however did not have any significant localizing or lateralizing abnormalities other than the dysarthria.  Patient was not a candidate for TPA secondary to GI bleed.     ED course  Relevant labs include -1430 CBC was drawn showing WBC of 12.5, hematocrit 29.6, hemoglobin 8.8.  Creatinine 2.52. CT head shows-no acute intracranial abnormality  Chart review-patient was seen Dr. Rachel Moulds on April 2021 for headaches and dizziness.  Patient was at that time started on nortriptyline 25 mg at night for headaches.  Lightheadedness was thought to be multifactorial related to medications possibly an autonomic component of his diabetic neuropathy.  LKW: 1400 hrs. tpa given?: no, GI bleed Premorbid modified Rankin scale (mRS): 2 NIH stroke scale: 1 for dysarthria   Past Medical History:  Diagnosis Date   AAA (abdominal aortic aneurysm) ([place])    a. Aortic US 05/2012: 3.9 cm fusiform abdominal aortic aneurysm. F/u recommended 05/2014.    Anemia    Arthritis    DDD   Atrial fibrillation ([place])    a. Post-op CABG 05/2009. b. Re-identified 12/2013.    CAD (coronary artery disease)    a. s/p CABGx4 05/2009. 10/10/16 PCI with DES x1 SVG-->OM2/OM3   Chronic constipation    CKD (chronic kidney disease), stage III    Cocaine abuse in remission ([place])    Depression    Diabetes mellitus    Empyema lung ([place])    a. PNA 02/2011 c/b empyema requiring chest tube, drainage of pleural effusion and decortication.   Frequent falls    GERD (gastroesophageal reflux disease)    Hepatic steatosis    Hyperlipidemia    Hypertension    Hypothyroidism    Iron deficiency anemia    LAFB (left anterior fascicular block)    Lung cancer ([place])    a. Tx with interleukin in 2002 per patient.   Nodule of left lung    a. Cavitary nodules 02/2011, f/u recommended.   Obesity    Post concussion syndrome    RBBB    Renal cell carcinoma    a. s/p R nephrectomy 2002. Reported pulmonary mets per note at that time.   Scrotal abscess    a. 2012 2/2 Streptococcus group C, status post incision and drainage.   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   Vitamin D deficiency    Wears dentures     Family History  Problem Relation Age of Onset   Heart disease Father    Alzheimer's disease  Mother    Pneumonia Other    Colon cancer Neg Hx    Esophageal cancer Neg Hx    Stomach cancer Neg Hx    Rectal cancer Neg Hx    Social History:   reports that he has never smoked. He has never used smokeless tobacco. He reports previous drug use. Drug: Cocaine. He reports that he does not drink alcohol.  Medications  Current Facility-Administered Medications:    0.9 %  sodium chloride infusion (Manually program via Guardrails IV Fluids), , Intravenous, Once, Kroeger, Krista M., PA-C   acetaminophen (TYLENOL) tablet 650 mg, 650 mg, Oral, Q4H PRN, Tamala Julian, Rondell A, MD   alum & mag hydroxide-simeth (MAALOX/MYLANTA)  200-200-20 MG/5ML suspension 30 mL, 30 mL, Oral, Q6H PRN, Tamala Julian, Rondell A, MD, 30 mL at 11/03/19 1324   buPROPion (WELLBUTRIN XL) 24 hr tablet 300 mg, 300 mg, Oral, Daily, Tamala Julian, Rondell A, MD, 300 mg at 11/03/19 0838   diltiazem (CARDIZEM) 125 mg in dextrose 5% 125 mL (1 mg/mL) infusion, 5-15 mg/hr, Intravenous, Titrated, Duane Boston, MD, Stopped at 11/03/19 1437   dronedarone (MULTAQ) tablet 400 mg, 400 mg, Oral, BID WC, Smith, Rondell A, MD, 400 mg at 11/03/19 5498   ezetimibe (ZETIA) tablet 5 mg, 5 mg, Oral, QHS, Smith, Rondell A, MD, 5 mg at 10/17/2019 2242   ferrous sulfate tablet 325 mg, 325 mg, Oral, Q M,W,F, Smith, Rondell A, MD, 325 mg at 11/04/2019 0950   finasteride (PROSCAR) tablet 5 mg, 5 mg, Oral, QHS, Smith, Rondell A, MD, 5 mg at 10/21/2019 2242   gabapentin (NEURONTIN) capsule 300 mg, 300 mg, Oral, BID, Smith, Rondell A, MD, 300 mg at 11/03/19 0834   insulin aspart (novoLOG) injection 0-15 Units, 0-15 Units, Subcutaneous, TID WC, Smith, Rondell A, MD, 5 Units at 11/03/19 1126   insulin aspart (novoLOG) injection 0-5 Units, 0-5 Units, Subcutaneous, QHS, Smith, Rondell A, MD, 2 Units at 10/31/19 2149   insulin detemir (LEVEMIR) injection 25 Units, 25 Units, Subcutaneous, BID, Fuller Plan A, MD, 25 Units at 11/03/19 2641   levothyroxine (SYNTHROID) tablet 150 mcg, 150 mcg, Oral, QAC breakfast, Fuller Plan A, MD, 150 mcg at 11/03/19 5830   lidocaine (XYLOCAINE) 5 % ointment 1 application, 1 application, Topical, BID PRN, Tamala Julian, Rondell A, MD   magnesium oxide (MAG-OX) tablet 400 mg, 400 mg, Oral, BID, Smith, Rondell A, MD, 400 mg at 11/03/19 0836   melatonin tablet 3 mg, 3 mg, Oral, QHS, Smith, Rondell A, MD, 3 mg at 10/22/2019 2242   metoprolol tartrate (LOPRESSOR) tablet 50 mg, 50 mg, Oral, TID, Josue Hector, MD, 50 mg at 11/03/19 0844   midodrine (PROAMATINE) tablet 2.5 mg, 2.5 mg, Oral, TID WC, Swayze, Ava, DO, 2.5 mg at 11/03/19 1126   morphine 2 MG/ML injection 1 mg, 1 mg,  Intravenous, Q1H PRN, Swayze, Ava, DO, 1 mg at 11/03/19 0829   nitroGLYCERIN (NITROSTAT) SL tablet 0.4 mg, 0.4 mg, Sublingual, Q5 min PRN, Kate Sable A, MD, 0.4 mg at 11/01/19 0853   nitroGLYCERIN 50 mg in dextrose 5 % 250 mL (0.2 mg/mL) infusion, 0-200 mcg/min, Intravenous, Titrated, Kroeger, Krista M., PA-C, Stopped at 11/03/19 1438   nortriptyline (PAMELOR) capsule 50 mg, 50 mg, Oral, QHS, Swayze, Ava, DO, 50 mg at 10/26/2019 2241   [START ON 11/04/2019] pantoprazole (PROTONIX) EC tablet 40 mg, 40 mg, Oral, BID AC, Mansouraty, Telford Nab., MD   polyethylene glycol (MIRALAX / GLYCOLAX) packet 17 g, 17 g, Oral, Daily PRN, Tye Savoy  M, NP   rosuvastatin (CRESTOR) tablet 40 mg, 40 mg, Oral, QHS, Smith, Rondell A, MD, 40 mg at 10/12/2019 2243   senna (SENOKOT) tablet 17.2 mg, 2 tablet, Oral, q AM, 17.2 mg at 11/03/19 2951 **AND** senna (SENOKOT) tablet 8.6 mg, 1 tablet, Oral, QHS, Smith, Rondell A, MD, 8.6 mg at 11/04/2019 2243   sertraline (ZOLOFT) tablet 25 mg, 25 mg, Oral, BH-q7a, Swayze, Ava, DO, 25 mg at 11/03/19 8841   sodium chloride flush (NS) 0.9 % injection 3 mL, 3 mL, Intravenous, Q12H, Josue Hector, MD   tamsulosin [facility]) capsule 0.4 mg, 0.4 mg, Oral, Daily, Tamala Julian, Rondell A, MD, 0.4 mg at 11/03/19 6606   traZODone (DESYREL) tablet 100 mg, 100 mg, Oral, QHS, Swayze, Ava, DO, 100 mg at 10/10/2019 2242   triamcinolone cream (KENALOG) 0.1 % 1 application, 1 application, Topical, Daily PRN, Tamala Julian, Rondell A, MD  ROS:   General ROS: negative for - chills, fatigue, fever, night sweats, weight gain or weight loss Psychological ROS: negative for - behavioral disorder, hallucinations, memory difficulties, mood swings or suicidal ideation Ophthalmic ROS: negative for - blurry vision, double vision, eye pain or loss of vision ENT ROS: negative for - epistaxis, nasal discharge, oral lesions, sore throat, tinnitus or vertigo Respiratory ROS: negative for - cough, hemoptysis, shortness of  breath or wheezing Cardiovascular ROS: negative for - chest pain, dyspnea on exertion, edema or irregular heartbeat Gastrointestinal ROS: negative for - abdominal pain, diarrhea, hematemesis, nausea/vomiting or stool incontinence Genito-Urinary ROS: negative for - dysuria, hematuria, incontinence or urinary frequency/urgency Musculoskeletal ROS: negative for - joint swelling or muscular weakness Neurological ROS: as noted in HPI Dermatological ROS: negative for rash and skin lesion changes  Exam: Current vital signs: BP 97/66 (BP Location: Right Arm)   Pulse 86   Temp 98.2 F (36.8 C) (Oral)   Resp 19   Ht 6' (1.829 m)   Wt 125.5 kg   SpO2 100%   BMI 37.53 kg/m  Vital signs in last 24 hours: Temp:  [97.7 F (36.5 C)-98.8 F (37.1 C)] 98.2 F (36.8 C) (05/25 1054) Pulse Rate:  [74-107] 86 (05/25 1457) Resp:  [17-27] 19 (05/25 1547) BP: (84-118)/(46-85) 97/66 (05/25 1547) SpO2:  [93 %-100 %] 100 % (05/25 1547) Weight:  [125.5 kg] 125.5 kg (05/25 0338)   Constitutional: Appears well-developed and well-nourished.  Eyes: No scleral injection HENT: No OP obstrucion Head: Normocephalic.  Cardiovascular: Normal rate and regular rhythm.  Respiratory:non-labored breathing GI: Soft.  No distension. There is no tenderness.  Skin: WDI  Neuro: Mental Status: Patient is awake, alert, oriented to person, place, speech shows no aphasia but does have dysarthria.  Patient is able to follow simple commands without difficulty. Cranial Nerves: II: Visual Fields are full.  III,IV, VI: EOMI without ptosis or diploplia. Pupils equal, round and reactive to light V: Facial sensation is symmetric to temperature VII: Facial movement is symmetric.  VIII: hearing is intact to voice X: Palat elevates symmetrically XI: Shoulder shrug is symmetric. XII: tongue is midline without atrophy or fasciculations.  Motor: Tone is normal. Bulk is normal. 5/5 strength was present in all four extremities.   Asterixis noted bilateral upper extremities Sensory: Sensation is symmetric to light touch and temperature in the arms and legs with a stocking distribution peripheral neuropathy  Deep Tendon Reflexes: 1+ bilateral brachioradialis with no knee jerk or ankle jerk Plantars: Toes are downgoing bilaterally.  Cerebellar: FNF and HKS are intact bilaterally  Labs I have reviewed  labs in epic and the results pertinent to this consultation are:   CBC    Component Value Date/Time   WBC 12.5 (H) 11/03/2019 1430   RBC 3.04 (L) 11/03/2019 1430   HGB 8.8 (L) 11/03/2019 1430   HCT 29.6 (L) 11/03/2019 1430   PLT 303 11/03/2019 1430   MCV 97.4 11/03/2019 1430   MCH 28.9 11/03/2019 1430   MCHC 29.7 (L) 11/03/2019 1430   RDW 15.5 11/03/2019 1430   LYMPHSABS 1.9 11/03/2019 1430   MONOABS 1.3 (H) 11/03/2019 1430   EOSABS 0.2 11/03/2019 1430   BASOSABS 0.1 11/03/2019 1430    CMP     Component Value Date/Time   NA 134 (L) 11/03/2019 1430   K 4.8 11/03/2019 1430   CL 99 11/03/2019 1430   CO2 24 11/03/2019 1430   GLUCOSE 125 (H) 11/03/2019 1430   BUN 19 11/03/2019 1430   CREATININE 2.52 (H) 11/03/2019 1430   CALCIUM 8.6 (L) 11/03/2019 1430   PROT 6.2 (L) 10/13/2018 1343   ALBUMIN 3.1 (L) 10/13/2018 1343   AST 26 10/13/2018 1343   ALT 19 10/13/2018 1343   ALT 79 (H) 10/20/2014 1225   ALKPHOS 29 (L) 10/13/2018 1343   BILITOT 1.1 10/13/2018 1343   GFRNONAA 24 (L) 11/03/2019 1430   GFRAA 28 (L) 11/03/2019 1430    Lipid Panel     Component Value Date/Time   CHOL 194 10/09/2016 0506   TRIG 265 (H) 10/09/2016 0506   HDL 28 (L) 10/09/2016 0506   CHOLHDL 6.9 10/09/2016 0506   VLDL 53 (H) 10/09/2016 0506   LDLCALC 113 (H) 10/09/2016 0506     Imaging I have reviewed the images obtained:  CT-scan of the brain-no acute abnormality, atrophy and chronic microvascular ischemia.  Etta Quill PA-C [facility] [phone]  M-F  (9:00 am- 5:00 PM)  11/03/2019, 3:59 PM    Assessment:  74 year old male with history of atrial fibrillation on Eliquis who was taken off of Eliquis secondary to GI bleed.  Today was noticed to have woken up with significant confusion and what was originally thought to be aphasia.  Code stroke was called patient was brought down to CT scan.  CT scan was negative patient was not given TPA secondary to GI bleed and patient was not IR candidate as NIH stroke scale was 1 and too good to treat.  At some point will obtain MRI however other differential includes possible metabolic encephalopathy.  Impression: --Acute toxic metabolic encephalopathy -Altered mental status -Asterixis -GI bleed  Recommendations: -Routine MRI brain without contrast if patient does not improve -Continue to treat metabolic disturbances, minimize pain medication given CKD -Frequent neurochecks and to notify MD if exam worsens -Blood pressure goal should be normal blood pressure, avoid hypotension. Permissive up to 220/120 mmhg   NEUROHOSPITALIST ADDENDUM Performed a face to face diagnostic evaluation.   I have reviewed the contents of history and physical exam as documented by PA/ARNP/Resident and agree with above documentation.  I have discussed and formulated the above plan as documented. Edits to the note have been made as needed.  Code stroke activated patient started to develop slurred speech around 2 PM. Rapid response called-blood gas was normal. Patient was more somnolent, speech was slurred and there is question for mild aphasia. On my assessment patient was able to repeat sentences, read without difficulty, name objects and follow commands. Alert and oriented x4. No neglect, both sensory or visual. Speech was dysarthric. Patient had asterixis on examination.  Suspect  presentation likely due to side effect of narcotics in the setting of CKD. However patient also admitted for GI blood loss-would recommend repeating hemoglobin. Patient also borderline  hypotensive and recommend and normotension. If patient does not return to baseline, consider MRI brain to evaluate for stroke as he is at risk for watershed infarcts.      Karena Addison Sybil Shrader MD [facility] [phone]   If 7pm to 7am, please call on call as listed on AMION.

## 2019-11-03 NOTE — Progress Notes (Signed)
Pt appears confused at this time. He is able to answer orientation questions however is yelling out, restless, and voice is garbled. Pt has not received prn pain medication for several hours. VSS. MD notified and in to see pt. New orders placed. Will continue to monitor.

## 2019-11-03 NOTE — Progress Notes (Signed)
Progress Note  Patient Name: Matthew [name] Date of Encounter: 11/03/2019  Primary Cardiologist: Larae Grooms, MD   Subjective   Continues to have non cardiac sounding chest pain requiring morphine   Inpatient Medications    Scheduled Meds: . sodium chloride   Intravenous Once  . buPROPion  300 mg Oral Daily  . dronedarone  400 mg Oral BID WC  . ezetimibe  5 mg Oral QHS  . ferrous sulfate  325 mg Oral Q M,W,F  . finasteride  5 mg Oral QHS  . gabapentin  300 mg Oral BID  . insulin aspart  0-15 Units Subcutaneous TID WC  . insulin aspart  0-5 Units Subcutaneous QHS  . insulin detemir  25 Units Subcutaneous BID  . levothyroxine  150 mcg Oral QAC breakfast  . magnesium oxide  400 mg Oral BID  . melatonin  3 mg Oral QHS  . metoprolol succinate  12.5 mg Oral Daily  . midodrine  2.5 mg Oral TID WC  . nortriptyline  50 mg Oral QHS  . [START ON 11/04/2019] pantoprazole  40 mg Oral BID AC  . pantoprazole (PROTONIX) IV  40 mg Intravenous Q12H  . rosuvastatin  40 mg Oral QHS  . senna  2 tablet Oral q AM   And  . senna  1 tablet Oral QHS  . sertraline  25 mg Oral BH-q7a  . tamsulosin  0.4 mg Oral Daily  . traZODone  100 mg Oral QHS   Continuous Infusions: . diltiazem (CARDIZEM) infusion 5 mg/hr (10/10/2019 1657)  . nitroGLYCERIN 20 mcg/min (11/04/2019 1106)   PRN Meds: acetaminophen, alum & mag hydroxide-simeth, lidocaine, morphine injection, nitroGLYCERIN, oxyCODONE-acetaminophen **AND** oxyCODONE, polyethylene glycol, triamcinolone cream   Vital Signs    Vitals:   10/19/2019 2035 11/03/19 0139 11/03/19 0338 11/03/19 0459  BP: (!) 94/57 (!) 96/48  (!) 90/51  Pulse: 74   98  Resp: 20 17  18   Temp: 97.7 F (36.5 C) 98.2 F (36.8 C)  98.1 F (36.7 C)  TempSrc: Oral Oral  Oral  SpO2: 100%   99%  Weight:   125.5 kg   Height:        Intake/Output Summary (Last 24 hours) at 11/03/2019 0832 Last data filed at 11/03/2019 0500 Gross per 24 hour  Intake 1501.24 ml    Output --  Net 1501.24 ml   Last 3 Weights 11/03/2019 10/18/2019 11/01/2019  Weight (lbs) 276 lb 11.2 oz 271 lb 14.4 oz 274 lb 1.6 oz  Weight (kg) 125.51 kg 123.333 kg 124.331 kg      Telemetry    Atrial fibrillation, variable rates 10/18/2019   ECG    Atrial fibrillation nonspecific ST changes  11/01/19   Physical Exam   Affect appropriate Obese male  HEENT: normal Neck supple with no adenopathy JVP normal no bruits no thyromegaly Lungs clear with no wheezing and good diaphragmatic motion Heart:  S1/S2 no murmur, no rub, gallop or click PMI normal post sternotomy  Abdomen: benighn, BS positve, no tenderness, no AAA no bruit.  No HSM or HJR Distal pulses intact with no bruits No edema Neuro non-focal Skin warm and dry No muscular weakness   Labs    High Sensitivity Troponin:   Recent Labs  Lab 11/07/2019 0725 10/13/2019 1344 11/01/2019 1702 11/01/19 1042 11/01/19 1205  TROPONINIHS 98* 2,635* 4,646* 1,042* 882*      Chemistry Recent Labs  Lab 10/31/19 1030 11/01/19 0326 11/03/19 0401  NA 139 133* 136  K 5.1 4.5 4.2  CL 103 102 100  CO2 26 25 24   GLUCOSE 218* 204* 191*  BUN 19 22 16   CREATININE 2.08* 2.06* 1.93*  CALCIUM 9.0 8.8* 8.6*  GFRNONAA 30* 31* 33*  GFRAA 35* 35* 38*  ANIONGAP 10 6 12      Hematology Recent Labs  Lab 10/31/19 1030 10/31/19 1030 11/01/19 0326 11/01/19 1656 11/03/19 0401  WBC 8.5  --  7.3  --  9.9  RBC 2.87*  --  2.85*  --  2.97*  HGB 8.4*   < > 8.1* 9.6* 8.6*  HCT 27.9*   < > 27.2* 31.5* 28.6*  MCV 97.2  --  95.4  --  96.3  MCH 29.3  --  28.4  --  29.0  MCHC 30.1  --  29.8*  --  30.1  RDW 14.5  --  14.4  --  15.4  PLT 277  --  261  --  265   < > = values in this interval not displayed.     Radiology    DG CHEST PORT 1 VIEW  Result Date: 11/01/2019 CLINICAL DATA:  Chest pain EXAM: PORTABLE CHEST 1 VIEW COMPARISON:  10/27/2019 FINDINGS: Cardiac shadow is enlarged but stable. Postsurgical changes are again seen.  Lungs are well aerated bilaterally. Mild scarring is noted in the right base. No acute bony abnormality is seen. IMPRESSION: No acute abnormality noted. Electronically Signed   By: Inez Catalina M.D.   On: 11/01/2019 16:28    Cardiac Studies   Echo 10/30/19:  1. Left ventricular ejection fraction, by estimation, is 50 to 55%. The  left ventricle has low normal function. Left ventricular endocardial  border not optimally defined to evaluate regional wall motion. There is  moderate left ventricular hypertrophy.  Left ventricular diastolic parameters are indeterminate. Elevated left  ventricular end-diastolic pressure.  2. Right ventricular systolic function is mildly reduced. The right  ventricular size is moderately enlarged. There is moderately elevated  pulmonary artery systolic pressure.  3. The mitral valve is degenerative. Trivial mitral valve regurgitation.  No evidence of mitral stenosis.  4. The aortic valve is abnormal. Aortic valve regurgitation is not  visualized. Aortic valve mean gradient measures 7.0 mmHg. Aortic valve has  moderately reduced cusp mobility, suggesting gradient may be  underestimated, with at least mild aortic valve  stenosis.  5. The inferior vena cava is normal in size with greater than 50%  respiratory variability, suggesting right atrial pressure of 3 mmHg.    Cardiac cath 10/15/18   Prox RCA lesion is 100% stenosed.  SVG graft was visualized by angiography and is normal in caliber.  Prox Cx to Mid Cx lesion is 100% stenosed.  SVG graft was visualized by angiography and is normal in caliber.  Ost LAD to Prox LAD lesion is 100% stenosed.  LIMA graft was visualized by angiography and is normal in caliber.  SVG graft was not injected.  Mid Graft lesion before 2nd Mrg is 99% stenosed.  A drug-eluting stent was successfully placed using a STENT RESOLUTE [id number].  Post intervention, there is a 0% residual stenosis.  1. Severe triple  vessel CAD s/p 5V CABG with 4/5 patent grafts 2. Chronic occlusion proximal LAD. Patent LIMA to mid LAD. Chronic occlusion of the vein graft to the Diagonal branch (not injected-known occlusion) 3. Chronic occlusion mid Circumflex. Patent sequential vein graft to OM2/OM3. Severe stenosis mid body of SVG to OM2/OM3 within the old stent 4. Successful PTCA/DES x  1 mid body of SVG to OM2/OM3 5. Chronic occlusion proximal RCA. Patent SVG to distal RCA  Recommendations: ASA and Plavix along with Eliquis for one month. Would stop ASA at one month and continue Plavix along with Eliquis for at least one year. Continue statin and beta blocker. Resume Eliquis tomorrow.    Patient Profile     74 y.o. male with a hx of coronary artery disease, CABG, hypertension, hyperlipidemia, paroxysmal atrial fibrillation, hypothyroidism, CKD who presented 10/18/2019 with chest pain and was found to have a GI bleedand  elevated troponins, elevated Cr and acute anemia with drop in Hgb from 16 to 8.  Heme+ stools. Eliquis and Plavix held.   Assessment & Plan    1.  Non-STEMI/CAD: Troponin coming down no acute ST changes Discussed with GI and need to wait 48 hours at least before re challenge with DAT Likely keep off eliquis for  A week Will tentatively plan diagnostic cath Thursday Hydrate as he has one kidney and baseline Cr around 2.0 EF ok at 50-55%   2.  Acute GI bleed: multiple stomach polyps one actively bleeding cauterized  Hct 28.6 today   3.  Paroxysmal atrial fibrillation: Anticoagulation on hold.  Continue Multaq try to increase beta blocker   4.  Hypertension: BP fluctuates with low normal readings today and up to 130/86 when I evaluated him.  5.  Hyperlipidemia: On rosuvastatin.  6.  Acute kidney injury: Creatinine 1.93     For questions or updates, please contact [place] Please consult [email or web address] for contact info under        Signed, Jenkins Rouge, MD  11/03/2019, 8:32 AM

## 2019-11-03 NOTE — Progress Notes (Signed)
RT NOTES: ABG obtained and sent to lab. Lab tech [place] notified.

## 2019-11-04 ENCOUNTER — Inpatient Hospital Stay (HOSPITAL_COMMUNITY): Payer: 59

## 2019-11-04 DIAGNOSIS — N184 Chronic kidney disease, stage 4 (severe): Secondary | ICD-10-CM

## 2019-11-04 DIAGNOSIS — I214 Non-ST elevation (NSTEMI) myocardial infarction: Secondary | ICD-10-CM

## 2019-11-04 DIAGNOSIS — N390 Urinary tract infection, site not specified: Secondary | ICD-10-CM | POA: Clinically undetermined

## 2019-11-04 LAB — COMPREHENSIVE METABOLIC PANEL
ALT: 59 U/L — ABNORMAL HIGH (ref 0–44)
AST: 72 U/L — ABNORMAL HIGH (ref 15–41)
Albumin: 3.2 g/dL — ABNORMAL LOW (ref 3.5–5.0)
Alkaline Phosphatase: 36 U/L — ABNORMAL LOW (ref 38–126)
Anion gap: 15 (ref 5–15)
BUN: 25 mg/dL — ABNORMAL HIGH (ref 8–23)
CO2: 19 mmol/L — ABNORMAL LOW (ref 22–32)
Calcium: 8.5 mg/dL — ABNORMAL LOW (ref 8.9–10.3)
Chloride: 96 mmol/L — ABNORMAL LOW (ref 98–111)
Creatinine, Ser: 3.38 mg/dL — ABNORMAL HIGH (ref 0.61–1.24)
GFR calc Af Amer: 19 mL/min — ABNORMAL LOW (ref >60)
GFR calc non Af Amer: 17 mL/min — ABNORMAL LOW (ref >60)
Glucose, Bld: 229 mg/dL — ABNORMAL HIGH (ref 70–99)
Potassium: 6.1 mmol/L — ABNORMAL HIGH (ref 3.5–5.1)
Sodium: 130 mmol/L — ABNORMAL LOW (ref 135–145)
Total Bilirubin: 0.8 mg/dL (ref 0.3–1.2)
Total Protein: 7 g/dL (ref 6.5–8.1)

## 2019-11-04 LAB — CBC WITH DIFFERENTIAL/PLATELET
Abs Immature Granulocytes: 0.14 10*3/uL — ABNORMAL HIGH (ref 0.00–0.07)
Abs Immature Granulocytes: 0.19 10*3/uL — ABNORMAL HIGH (ref 0.00–0.07)
Basophils Absolute: 0.1 10*3/uL (ref 0.0–0.1)
Basophils Absolute: 0.1 10*3/uL (ref 0.0–0.1)
Basophils Relative: 1 %
Basophils Relative: 1 %
Eosinophils Absolute: 0 10*3/uL (ref 0.0–0.5)
Eosinophils Absolute: 0 10*3/uL (ref 0.0–0.5)
Eosinophils Relative: 0 %
Eosinophils Relative: 0 %
HCT: 32.1 % — ABNORMAL LOW (ref 39.0–52.0)
HCT: 34.2 % — ABNORMAL LOW (ref 39.0–52.0)
Hemoglobin: 10.2 g/dL — ABNORMAL LOW (ref 13.0–17.0)
Hemoglobin: 9.5 g/dL — ABNORMAL LOW (ref 13.0–17.0)
Immature Granulocytes: 1 %
Immature Granulocytes: 1 %
Lymphocytes Relative: 11 %
Lymphocytes Relative: 8 %
Lymphs Abs: 1.1 10*3/uL (ref 0.7–4.0)
Lymphs Abs: 1.8 10*3/uL (ref 0.7–4.0)
MCH: 29.1 pg (ref 26.0–34.0)
MCH: 29.4 pg (ref 26.0–34.0)
MCHC: 29.6 g/dL — ABNORMAL LOW (ref 30.0–36.0)
MCHC: 29.8 g/dL — ABNORMAL LOW (ref 30.0–36.0)
MCV: 98.5 fL (ref 80.0–100.0)
MCV: 98.6 fL (ref 80.0–100.0)
Monocytes Absolute: 1.1 10*3/uL — ABNORMAL HIGH (ref 0.1–1.0)
Monocytes Absolute: 1.2 10*3/uL — ABNORMAL HIGH (ref 0.1–1.0)
Monocytes Relative: 8 %
Monocytes Relative: 8 %
Neutro Abs: 12 10*3/uL — ABNORMAL HIGH (ref 1.7–7.7)
Neutro Abs: 12.3 10*3/uL — ABNORMAL HIGH (ref 1.7–7.7)
Neutrophils Relative %: 79 %
Neutrophils Relative %: 82 %
Platelets: 361 10*3/uL (ref 150–400)
Platelets: 365 10*3/uL (ref 150–400)
RBC: 3.26 MIL/uL — ABNORMAL LOW (ref 4.22–5.81)
RBC: 3.47 MIL/uL — ABNORMAL LOW (ref 4.22–5.81)
RDW: 15.8 % — ABNORMAL HIGH (ref 11.5–15.5)
RDW: 15.9 % — ABNORMAL HIGH (ref 11.5–15.5)
WBC: 14.5 10*3/uL — ABNORMAL HIGH (ref 4.0–10.5)
WBC: 15.5 10*3/uL — ABNORMAL HIGH (ref 4.0–10.5)
nRBC: 0.1 % (ref 0.0–0.2)
nRBC: 0.3 % — ABNORMAL HIGH (ref 0.0–0.2)

## 2019-11-04 LAB — BASIC METABOLIC PANEL
Anion gap: 16 — ABNORMAL HIGH (ref 5–15)
Anion gap: 14 (ref 5–15)
Anion gap: 13 (ref 5–15)
Anion gap: 14 (ref 5–15)
BUN: 27 mg/dL — ABNORMAL HIGH (ref 8–23)
BUN: 31 mg/dL — ABNORMAL HIGH (ref 8–23)
BUN: 34 mg/dL — ABNORMAL HIGH (ref 8–23)
BUN: 39 mg/dL — ABNORMAL HIGH (ref 8–23)
CO2: 21 mmol/L — ABNORMAL LOW (ref 22–32)
CO2: 19 mmol/L — ABNORMAL LOW (ref 22–32)
CO2: 19 mmol/L — ABNORMAL LOW (ref 22–32)
CO2: 17 mmol/L — ABNORMAL LOW (ref 22–32)
Calcium: 8.4 mg/dL — ABNORMAL LOW (ref 8.9–10.3)
Calcium: 8.2 mg/dL — ABNORMAL LOW (ref 8.9–10.3)
Calcium: 8.1 mg/dL — ABNORMAL LOW (ref 8.9–10.3)
Calcium: 8.1 mg/dL — ABNORMAL LOW (ref 8.9–10.3)
Chloride: 96 mmol/L — ABNORMAL LOW (ref 98–111)
Chloride: 97 mmol/L — ABNORMAL LOW (ref 98–111)
Chloride: 99 mmol/L (ref 98–111)
Chloride: 100 mmol/L (ref 98–111)
Creatinine, Ser: 3.69 mg/dL — ABNORMAL HIGH (ref 0.61–1.24)
Creatinine, Ser: 3.86 mg/dL — ABNORMAL HIGH (ref 0.61–1.24)
Creatinine, Ser: 3.84 mg/dL — ABNORMAL HIGH (ref 0.61–1.24)
Creatinine, Ser: 3.94 mg/dL — ABNORMAL HIGH (ref 0.61–1.24)
GFR calc Af Amer: 18 mL/min — ABNORMAL LOW (ref >60)
GFR calc Af Amer: 17 mL/min — ABNORMAL LOW (ref >60)
GFR calc Af Amer: 17 mL/min — ABNORMAL LOW (ref >60)
GFR calc Af Amer: 16 mL/min — ABNORMAL LOW (ref >60)
GFR calc non Af Amer: 15 mL/min — ABNORMAL LOW (ref >60)
GFR calc non Af Amer: 14 mL/min — ABNORMAL LOW (ref >60)
GFR calc non Af Amer: 14 mL/min — ABNORMAL LOW (ref >60)
GFR calc non Af Amer: 14 mL/min — ABNORMAL LOW (ref >60)
Glucose, Bld: 223 mg/dL — ABNORMAL HIGH (ref 70–99)
Glucose, Bld: 214 mg/dL — ABNORMAL HIGH (ref 70–99)
Glucose, Bld: 239 mg/dL — ABNORMAL HIGH (ref 70–99)
Glucose, Bld: 194 mg/dL — ABNORMAL HIGH (ref 70–99)
Potassium: 6.3 mmol/L (ref 3.5–5.1)
Potassium: 5.8 mmol/L — ABNORMAL HIGH (ref 3.5–5.1)
Potassium: 5.4 mmol/L — ABNORMAL HIGH (ref 3.5–5.1)
Potassium: 5.7 mmol/L — ABNORMAL HIGH (ref 3.5–5.1)
Sodium: 133 mmol/L — ABNORMAL LOW (ref 135–145)
Sodium: 130 mmol/L — ABNORMAL LOW (ref 135–145)
Sodium: 131 mmol/L — ABNORMAL LOW (ref 135–145)
Sodium: 131 mmol/L — ABNORMAL LOW (ref 135–145)

## 2019-11-04 LAB — TROPONIN I (HIGH SENSITIVITY)
Troponin I (High Sensitivity): 4560 ng/L (ref <18)
Troponin I (High Sensitivity): 5652 ng/L (ref <18)
Troponin I (High Sensitivity): 5140 ng/L (ref <18)
Troponin I (High Sensitivity): 4573 ng/L (ref <18)

## 2019-11-04 LAB — SURGICAL PATHOLOGY

## 2019-11-04 LAB — URINALYSIS, ROUTINE W REFLEX MICROSCOPIC
Bilirubin Urine: NEGATIVE
Glucose, UA: 50 mg/dL — AB
Ketones, ur: NEGATIVE mg/dL
Nitrite: NEGATIVE
Protein, ur: >=300 mg/dL — AB
Specific Gravity, Urine: 1.019 (ref 1.005–1.030)
pH: 5 (ref 5.0–8.0)

## 2019-11-04 LAB — GLIA (IGA/G) + TTG IGA
Antigliadin Abs, IgA: 5 units (ref 0–19)
Gliadin IgG: 2 units (ref 0–19)
Tissue Transglutaminase Ab, IgA: <2 U/mL (ref 0–3)

## 2019-11-04 LAB — LACTIC ACID, PLASMA
Lactic Acid, Venous: 3.6 mmol/L (ref 0.5–1.9)
Lactic Acid, Venous: 3.6 mmol/L (ref 0.5–1.9)
Lactic Acid, Venous: 3.5 mmol/L (ref 0.5–1.9)
Lactic Acid, Venous: 3.3 mmol/L (ref 0.5–1.9)

## 2019-11-04 LAB — CREATININE, URINE, RANDOM: Creatinine, Urine: 214.56 mg/dL

## 2019-11-04 LAB — H. PYLORI ANTIBODY, IGG: H Pylori IgG: 0.14 Index Value (ref 0.00–0.79)

## 2019-11-04 LAB — GLUCOSE, CAPILLARY
Glucose-Capillary: 201 mg/dL — ABNORMAL HIGH (ref 70–99)
Glucose-Capillary: 219 mg/dL — ABNORMAL HIGH (ref 70–99)
Glucose-Capillary: 181 mg/dL — ABNORMAL HIGH (ref 70–99)
Glucose-Capillary: 202 mg/dL — ABNORMAL HIGH (ref 70–99)

## 2019-11-04 LAB — SODIUM, URINE, RANDOM: Sodium, Ur: <10 mmol/L

## 2019-11-04 MED ORDER — SODIUM ZIRCONIUM CYCLOSILICATE 10 G PO PACK
10.0000 g | PACK | Freq: Three times a day (TID) | ORAL | Status: AC
  Administered 2019-11-04 (×3): 10 g via ORAL
  Filled 2019-11-04 (×3): qty 1

## 2019-11-04 MED ORDER — SODIUM CHLORIDE 0.9 % IV BOLUS: 1000.0000 mL | Freq: Once | INTRAVENOUS | Status: AC

## 2019-11-04 MED ORDER — HYDROMORPHONE HCL 1 MG/ML IJ SOLN
0.5000 mg | INTRAMUSCULAR | Status: AC | PRN
  Administered 2019-11-04: 0.5 mg via INTRAVENOUS
  Filled 2019-11-04: qty 1

## 2019-11-04 MED ORDER — SODIUM ZIRCONIUM CYCLOSILICATE 10 G PO PACK
10.0000 g | PACK | Freq: Every day | ORAL | Status: AC
  Administered 2019-11-04: 10 g via ORAL
  Filled 2019-11-04: qty 1

## 2019-11-04 MED ORDER — CLOPIDOGREL BISULFATE 75 MG PO TABS
75.0000 mg | ORAL_TABLET | Freq: Every day | ORAL | Status: DC
  Administered 2019-11-04 – 2019-11-05 (×2): 75 mg via ORAL
  Filled 2019-11-04 (×3): qty 1

## 2019-11-04 MED ORDER — ATORVASTATIN CALCIUM 40 MG PO TABS
80.0000 mg | ORAL_TABLET | Freq: Every day | ORAL | Status: DC
  Administered 2019-11-04 – 2019-11-05 (×2): 80 mg via ORAL
  Filled 2019-11-04 (×2): qty 1

## 2019-11-04 MED ORDER — MIDODRINE HCL 5 MG PO TABS
10.0000 mg | ORAL_TABLET | Freq: Three times a day (TID) | ORAL | Status: DC
  Administered 2019-11-04 – 2019-11-05 (×5): 10 mg via ORAL
  Filled 2019-11-04 (×6): qty 2

## 2019-11-04 MED ORDER — MIDODRINE HCL 5 MG PO TABS: 5.0000 mg | ORAL_TABLET | Freq: Three times a day (TID) | ORAL | Status: DC

## 2019-11-04 MED ORDER — CHLORHEXIDINE GLUCONATE CLOTH 2 % EX PADS
6.0000 | MEDICATED_PAD | Freq: Every day | CUTANEOUS | Status: DC
  Administered 2019-11-04 – 2019-11-07 (×3): 6 via TOPICAL

## 2019-11-04 MED ORDER — SODIUM CHLORIDE 0.9 % IV BOLUS
250.0000 mL | Freq: Once | INTRAVENOUS | Status: AC
  Administered 2019-11-04: 250 mL via INTRAVENOUS

## 2019-11-04 MED ORDER — SODIUM POLYSTYRENE SULFONATE 15 GM/60ML PO SUSP
45.0000 g | Freq: Once | ORAL | Status: AC
  Administered 2019-11-04: 45 g via ORAL
  Filled 2019-11-04: qty 180

## 2019-11-04 MED ORDER — OLANZAPINE 5 MG PO TBDP
5.0000 mg | ORAL_TABLET | Freq: Every evening | ORAL | Status: DC | PRN
  Administered 2019-11-05: 5 mg via ORAL
  Filled 2019-11-04 (×2): qty 1

## 2019-11-04 MED ORDER — BUPROPION HCL ER (XL) 150 MG PO TB24
150.0000 mg | ORAL_TABLET | Freq: Every day | ORAL | Status: DC
  Administered 2019-11-05: 150 mg via ORAL
  Filled 2019-11-04 (×2): qty 1

## 2019-11-04 MED ORDER — SODIUM CHLORIDE 0.9 % IV SOLN
1.0000 g | Freq: Every day | INTRAVENOUS | Status: DC
  Administered 2019-11-04 – 2019-11-05 (×2): 1 g via INTRAVENOUS
  Filled 2019-11-04 (×2): qty 10

## 2019-11-04 NOTE — Progress Notes (Signed)
20:40  RN paged NP reporting abdominal pain and no UOP, bladder scan > 400.  In and out cath yielded 1060m.  Pain was localized to lower central abdomen/suprapubic area.  Abdomen soft and non tender to palpation, repeat bladder scan showed no urine present.  Pt had previously had morphine and oxycodone on day shift with no relief of abdominal pain.  Pt denied chest pain.  Pt was hypotensive though MAP remained greater than 60.  Wife stated his BP always runs low.  Recent EGD/Colonoscopy.  HR NSR, lungs CTA bilaterally, pulses +1 all four extremities.  Speech was difficult to understand but patient responded appropriately.  22:30 RN reported pain continues, no relief with fentanyl.  Abdominal xray to help rule out perforation with no sign of ileus or obstruction.  Ordered repeat CBC and waiting for pending repeat lactic acid.  Spoke with wife and son regarding plans for further evaluation.  Abdominal pain continues, abdomen soft and non tender.  00:30 Rectal temp slight increase, lactic acid increased from 1.7 to 3.6, WBC increased to 15.5.  Ordered blood and urine cultures and discussed broad spectrum ABX with pharmacy given lactic acid, hypotension, WBC elevation.  Ordered Rocephin and flagyl pending lab/culture results.  [facility] with plan to gently hydrate, abx, and repeat labs.

## 2019-11-04 NOTE — Consult Note (Signed)
[facility]  INPATIENT CONSULTATION  Reason for Consultation: AKI  Requesting Provider: Dr. Grandville Silos  HPI: Matthew [name] is an 74 y.o. male with complex medical history including DM, A fib, CAD, HL, AAA, depression, GERD, depression, memory loss, solitary kidney (s/p remote nephrectomy) who is admitted with NSTEMI and acute LGIB who is seen for evaluation and management of AKI on CKD.    Last known renal function was baseline 2-2.2 as of 08/2019; Admitted 5/20 with Cr 2.1 and remained stable until 5/25 when it trended up to 2.52 > 5/26 3.9 with K 6.3.     He presented 10/20/2019 with CP, Tachycardic to 150s, guiac +, Hb 8.8 (08/2019 Hb 15).  Subsequently Hb downtrending, plavix and eliquis held.  Required transfusion.  EGD with gastric polyps with recent bleeding, treated. Troponin trended up c/w NSTEMI, no LHC yet in light of AKI.  Acute mental status change 5/25 - CT scan ?infarct, neurology saw and didn't think so; also hypoxic and hypothermic, hypotensive 90/50s receiving NS bolus.  I/Os yesterday 1.7L / 1.0L UOP.  This AM BPs improved with all SBP > 100 now.  Lactates this AM 3s, up from 1.7 on admit. He has no general complaints this AM.  Per RN having some confusion today.    Tells me he pulled his foley catheter and is having some bloody outpt now.  No pain there.   PMH: Past Medical History:  Diagnosis Date  . AAA (abdominal aortic aneurysm) ([place])    a. Aortic US 05/2012: 3.9 cm fusiform abdominal aortic aneurysm. F/u recommended 05/2014.  Marland Kitchen Anemia   . Arthritis    DDD  . Atrial fibrillation ([place])    a. Post-op CABG 05/2009. b. Re-identified 12/2013.   Marland Kitchen CAD (coronary artery disease)    a. s/p CABGx4 05/2009. 10/10/16 PCI with DES x1 SVG-->OM2/OM3  . Chronic constipation   . CKD (chronic kidney disease), stage III   . Cocaine abuse in remission ([place])   . Depression   . Diabetes mellitus   . Empyema lung ([place])    a. PNA 02/2011 c/b empyema requiring chest tube,  drainage of pleural effusion and decortication.  . Frequent falls   . GERD (gastroesophageal reflux disease)   . Hepatic steatosis   . Hyperlipidemia   . Hypertension   . Hypothyroidism   . Iron deficiency anemia   . LAFB (left anterior fascicular block)   . Lung cancer ([place])    a. Tx with interleukin in 2002 per patient.  . Nodule of left lung    a. Cavitary nodules 02/2011, f/u recommended.  . Obesity   . Post concussion syndrome   . RBBB   . Renal cell carcinoma    a. s/p R nephrectomy 2002. Reported pulmonary mets per note at that time.  . Scrotal abscess    a. 2012 2/2 Streptococcus group C, status post incision and drainage.  . Sleep apnea    wears CPAP  . 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  . Vitamin D deficiency   . Wears dentures    PSH: Past Surgical History:  Procedure Laterality Date  . Bronchoscopy, right video-assisted thoracoscopy, minithoracotomy, drainage of pleural effusion, and decortication.  02/26/2011   [facility]  . CABG x 4  05/13/2009   [facility]  . CARDIAC SURGERY    . COLONOSCOPY W/ BIOPSIES AND POLYPECTOMY    . COLONOSCOPY WITH PROPOFOL N/A 10/20/2019   Procedure: COLONOSCOPY WITH PROPOFOL;  Surgeon: Irving Copas., MD;  Location: [facility];  Service: Gastroenterology;  Laterality: N/A;  . CORONARY STENT INTERVENTION N/A 10/10/2016   Procedure: Coronary Stent Intervention;  Surgeon: Troy Sine, MD;  Location: [facility];  Service: Cardiovascular;  Laterality: N/A;  . CORONARY STENT INTERVENTION N/A 10/15/2018   Procedure: CORONARY STENT INTERVENTION;  Surgeon: Burnell Blanks, MD;  Location: [place] [facility];  Service: Cardiovascular;  Laterality: N/A;  . ENDOSCOPIC MUCOSAL RESECTION  11/09/2019   Procedure: ENDOSCOPIC MUCOSAL RESECTION;  Surgeon: Rush Landmark Telford Nab., MD;  Location: [facility];  Service: Gastroenterology;;  . ESOPHAGOGASTRODUODENOSCOPY (EGD) WITH PROPOFOL N/A 10/18/2019    Procedure: ESOPHAGOGASTRODUODENOSCOPY (EGD) WITH PROPOFOL;  Surgeon: Irving Copas., MD;  Location: [facility];  Service: Gastroenterology;  Laterality: N/A;  . HEMORRHOID SURGERY    . HEMOSTASIS CLIP PLACEMENT  11/01/2019   Procedure: HEMOSTASIS CLIP PLACEMENT;  Surgeon: Irving Copas., MD;  Location: [facility];  Service: Gastroenterology;;  . HEMOSTASIS CONTROL  10/24/2019   Procedure: HEMOSTASIS CONTROL;  Surgeon: Irving Copas., MD;  Location: [facility];  Service: Gastroenterology;;  . HERNIA REPAIR    . HOT HEMOSTASIS N/A 10/10/2019   Procedure: HOT HEMOSTASIS (ARGON PLASMA COAGULATION/BICAP);  Surgeon: Irving Copas., MD;  Location: [place];  Service: Gastroenterology;  Laterality: N/A;  . Incision, drainage and debridement of perineum and  scrotum    . Irrigation and debridement of scrotal wound and closure of scrotal wound.    Marland Kitchen LEFT HEART CATH AND CORS/GRAFTS ANGIOGRAPHY N/A 10/10/2016   Procedure: Left Heart Cath and Cors/Grafts Angiography;  Surgeon: Troy Sine, MD;  Location: [facility];  Service: Cardiovascular;  Laterality: N/A;  . LEFT HEART CATH AND CORS/GRAFTS ANGIOGRAPHY N/A 10/15/2018   Procedure: LEFT HEART CATH AND CORS/GRAFTS ANGIOGRAPHY;  Surgeon: Burnell Blanks, MD;  Location: [facility];  Service: Cardiovascular;  Laterality: N/A;  . MULTIPLE TOOTH EXTRACTIONS    . RADIOLOGY WITH ANESTHESIA N/A 05/09/2017   Procedure: MRI WITH ANESTHESIA   BRAIN WITHOUT CONTRAST;  Surgeon: Radiologist, Medication, MD;  Location: [facility];  Service: Radiology;  Laterality: N/A;  . Right radical nephrectomy.    Lia Foyer LIFTING INJECTION  10/25/2019   Procedure: SUBMUCOSAL LIFTING INJECTION;  Surgeon: Rush Landmark Telford Nab., MD;  Location: [facility];  Service: Gastroenterology;;  . TOOTH EXTRACTION      Past Medical History:  Diagnosis Date  . AAA (abdominal aortic aneurysm) ([place])    a. Aortic US 05/2012:  3.9 cm fusiform abdominal aortic aneurysm. F/u recommended 05/2014.  Marland Kitchen Anemia   . Arthritis    DDD  . Atrial fibrillation ([place])    a. Post-op CABG 05/2009. b. Re-identified 12/2013.   Marland Kitchen CAD (coronary artery disease)    a. s/p CABGx4 05/2009. 10/10/16 PCI with DES x1 SVG-->OM2/OM3  . Chronic constipation   . CKD (chronic kidney disease), stage III   . Cocaine abuse in remission ([place])   . Depression   . Diabetes mellitus   . Empyema lung ([place])    a. PNA 02/2011 c/b empyema requiring chest tube, drainage of pleural effusion and decortication.  . Frequent falls   . GERD (gastroesophageal reflux disease)   . Hepatic steatosis   . Hyperlipidemia   . Hypertension   . Hypothyroidism   . Iron deficiency anemia   . LAFB (left anterior fascicular block)   . Lung cancer ([place])    a. Tx with interleukin in 2002 per patient.  Marland Kitchen  Nodule of left lung    a. Cavitary nodules 02/2011, f/u recommended.  . Obesity   . Post concussion syndrome   . RBBB   . Renal cell carcinoma    a. s/p R nephrectomy 2002. Reported pulmonary mets per note at that time.  . Scrotal abscess    a. 2012 2/2 Streptococcus group C, status post incision and drainage.  . Sleep apnea    wears CPAP  . 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  . Vitamin D deficiency   . Wears dentures     Medications:  I have reviewed the patient's current medications.  Medications Prior to Admission  Medication Sig Dispense Refill  . apixaban (ELIQUIS) 5 MG TABS tablet Take 5 mg by mouth 2 (two) times daily.    Marland Kitchen buPROPion (WELLBUTRIN XL) 300 MG 24 hr tablet Take 300 mg by mouth daily.    . clopidogrel (PLAVIX) 75 MG tablet Take 1 tablet (75 mg total) by mouth daily with breakfast. 30 tablet 11  . dronedarone (MULTAQ) 400 MG tablet Take 400 mg by mouth 2 (two) times daily with a meal.    . ezetimibe (ZETIA) 10 MG tablet Take 5 mg by mouth at bedtime.    . ferrous sulfate 325 (65 FE) MG tablet Take 325 mg by mouth  every Monday, Wednesday, and Friday.     . finasteride (PROSCAR) 5 MG tablet Take 5 mg by mouth at bedtime.     . gabapentin (NEURONTIN) 300 MG capsule Take 300 mg by mouth 2 (two) times daily.     . Insulin Detemir (LEVEMIR FLEXPEN) 100 UNIT/ML Pen Inject 50 Units into the skin 2 (two) times daily.     Marland Kitchen levothyroxine (SYNTHROID) 150 MCG tablet Take 150 mcg by mouth daily before breakfast.     . lidocaine (XYLOCAINE) 5 % ointment Apply 1 application topically 2 (two) times daily as needed (Neuropathy). Apply to feet    . magnesium oxide (MAG-OX) 400 MG tablet Take 400 mg by mouth in the morning, at noon, and at bedtime.     . Melatonin 3 MG CAPS Take 1 capsule by mouth at bedtime.    . metoprolol succinate (TOPROL-XL) 25 MG 24 hr tablet Take 12.5 mg by mouth daily.    . midodrine (PROAMATINE) 2.5 MG tablet Take 2.5 mg by mouth 3 (three) times daily.    . nortriptyline (PAMELOR) 25 MG capsule Take 50 mg by mouth at bedtime.     . Omega-3 Fatty Acids (FISH OIL) 1000 MG CAPS Take 2,000 mg by mouth 2 (two) times daily.     Marland Kitchen oxyCODONE-acetaminophen (PERCOCET) 10-325 MG per tablet Take 1 tablet by mouth every 6 (six) hours as needed for pain.     . RABEprazole (ACIPHEX) 20 MG tablet Take 20 mg by mouth in the morning and at bedtime.    . rosuvastatin (CRESTOR) 40 MG tablet Take 40 mg by mouth at bedtime.     . senna (SENOKOT) 8.6 MG TABS tablet Take 1-2 tablets by mouth See admin instructions. Take 2 tablets by mouth in the morning and 1 tablet at bedtime    . sertraline (ZOLOFT) 25 MG tablet Take 25 mg by mouth every morning.    . tamsulosin (FLOMAX) 0.4 MG CAPS capsule Take 0.4 mg by mouth daily.    Marland Kitchen triamcinolone cream (KENALOG) 0.1 % Apply 1 application topically daily as needed (rash).    Marland Kitchen VITAMIN D PO  Take 1,000 Units by mouth daily.    . memantine (NAMENDA) 10 MG tablet Take 10 mg by mouth 2 (two) times daily.    . traZODone (DESYREL) 100 MG tablet Take 100 mg by mouth at bedtime.       ALLERGIES:   Allergies  Allergen Reactions  . Amiodarone Other (See Comments)    Caused lung and liver toxicity  . Tetanus Toxoids Hives  . Morphine And Related Other (See Comments)    "made him crazy and heart was racing"    FAM HX: Family History  Problem Relation Age of Onset  . Heart disease Father   . Alzheimer's disease Mother   . Pneumonia Other   . Colon cancer Neg Hx   . Esophageal cancer Neg Hx   . Stomach cancer Neg Hx   . Rectal cancer Neg Hx     Social History:   reports that he has never smoked. He has never used smokeless tobacco. He reports previous drug use. Drug: Cocaine. He reports that he does not drink alcohol.  ROS: 12 system ROS neg except per HPI above  Blood pressure 108/72, pulse (!) 57, temperature 97.8 F (36.6 C), temperature source Oral, resp. rate 18, height 6' (1.829 m), weight 129.4 kg, SpO2 100 %. PHYSICAL EXAM: Gen: elderly man watching bed in no distress  Eyes:  anicteric ENT: MMM Neck: supple CV:  Borderline brady, regular, no rub Abd: soft, nontender Lungs: normal WOB at rest, clear ant and laterally GU: foley with blood tinged urine Extr:  Trace tibial edema Neuro: AOx3 and conversant Skin: cool and dry   Results for orders placed or performed during the hospital encounter of 10/23/2019 (from the past 48 hour(s))  Glucose, capillary     Status: Abnormal   Collection Time: 11/01/2019 11:49 AM  Result Value Ref Range   Glucose-Capillary 155 (H) 70 - 99 mg/dL    Comment: Glucose reference range applies only to samples taken after fasting for at least 8 hours.   Comment 1 Notify RN    Comment 2 Document in Chart   Surgical pathology     Status: None   Collection Time: 10/28/2019  3:50 PM  Result Value Ref Range   SURGICAL PATHOLOGY      SURGICAL PATHOLOGY CASE: [id number] PATIENT: Jason Fila Surgical Pathology Report     Clinical History: anemia, FOBT positive (cm)     FINAL MICROSCOPIC DIAGNOSIS:  A.  STOMACH, POLYPECTOMY: - Hyperplastic polyp. - No intestinal metaplasia, dysplasia, or malignancy.  GROSS DESCRIPTION:  Received in formalin are 2 rubbery tan mucosal polyps measuring 1.0 x 0.9 x 0.6 cm and 1.5 x 0.9 x 0.8 cm.  The polyps are inked, sectioned and entirely submitted in 2 cassettes.  [facility] 11/03/2019)   Final Diagnosis performed by Vicente Males, MD.   Electronically signed 11/04/2019 Technical component performed at [facility]. [facility], [place] [address] [place], [place] [address].  Professional component performed at [facility], [place] [address] [place], [place] [address].  Immunohistochemistry Technical component (if applicable) was performed at [facility]. [address] [place], [place],  [address].   [place] (if applicable): Some of these immunohistochemical stains may have been developed and the performance characteristics determine by [facility]. Some may not have been cleared or approved by the U.S. Food and Drug Administration. The FDA has determined that such clearance or approval is not necessary. This test is used for clinical purposes.  It should not be regarded as investigational or for research. This laboratory is certified under the [place] (CLIA-88) as qualified to perform high complexity clinical laboratory testing.  The controls stained appropriately.   Glucose, capillary     Status: Abnormal   Collection Time: 11/08/2019  5:55 PM  Result Value Ref Range   Glucose-Capillary 137 (H) 70 - 99 mg/dL    Comment: Glucose reference range applies only to samples taken after fasting for at least 8 hours.   Comment 1 Notify RN    Comment 2 Document in Chart   Glucose, capillary     Status: Abnormal   Collection Time: 11/01/2019  9:58 PM  Result Value Ref Range   Glucose-Capillary 161 (H) 70 - 99 mg/dL    Comment: Glucose  reference range applies only to samples taken after fasting for at least 8 hours.  CBC with Differential/Platelet     Status: Abnormal   Collection Time: 11/03/19  4:01 AM  Result Value Ref Range   WBC 9.9 4.0 - 10.5 K/uL   RBC 2.97 (L) 4.22 - 5.81 MIL/uL   Hemoglobin 8.6 (L) 13.0 - 17.0 g/dL   HCT 28.6 (L) 39.0 - 52.0 %   MCV 96.3 80.0 - 100.0 fL   MCH 29.0 26.0 - 34.0 pg   MCHC 30.1 30.0 - 36.0 g/dL   RDW 15.4 11.5 - 15.5 %   Platelets 265 150 - 400 K/uL   nRBC 0.0 0.0 - 0.2 %   Neutrophils Relative % 73 %   Neutro Abs 7.3 1.7 - 7.7 K/uL   Lymphocytes Relative 16 %   Lymphs Abs 1.5 0.7 - 4.0 K/uL   Monocytes Relative 9 %   Monocytes Absolute 0.9 0.1 - 1.0 K/uL   Eosinophils Relative 1 %   Eosinophils Absolute 0.1 0.0 - 0.5 K/uL   Basophils Relative 0 %   Basophils Absolute 0.0 0.0 - 0.1 K/uL   Immature Granulocytes 1 %   Abs Immature Granulocytes 0.07 0.00 - 0.07 K/uL    Comment: Performed at [facility], [address] [place], [place] [address]  Basic metabolic panel     Status: Abnormal   Collection Time: 11/03/19  4:01 AM  Result Value Ref Range   Sodium 136 135 - 145 mmol/L   Potassium 4.2 3.5 - 5.1 mmol/L   Chloride 100 98 - 111 mmol/L   CO2 24 22 - 32 mmol/L   Glucose, Bld 191 (H) 70 - 99 mg/dL    Comment: Glucose reference range applies only to samples taken after fasting for at least 8 hours.   BUN 16 8 - 23 mg/dL   Creatinine, Ser 1.93 (H) 0.61 - 1.24 mg/dL   Calcium 8.6 (L) 8.9 - 10.3 mg/dL   GFR calc non Af Amer 33 (L) >60 mL/min   GFR calc Af Amer 38 (L) >60 mL/min   Anion gap 12 5 - 15    Comment: Performed at [facility] [address] [place], [place] [address]  Glucose, capillary     Status: Abnormal   Collection Time: 11/03/19  8:13 AM  Result Value Ref Range   Glucose-Capillary 147 (H) 70 - 99 mg/dL    Comment: Glucose reference range applies only to samples taken after fasting for at least 8 hours.  Glucose, capillary     Status:  Abnormal   Collection Time: 11/03/19 11:21 AM  Result Value Ref Range  Glucose-Capillary 206 (H) 70 - 99 mg/dL    Comment: Glucose reference range applies only to samples taken after fasting for at least 8 hours.  Basic metabolic panel     Status: Abnormal   Collection Time: 11/03/19  2:30 PM  Result Value Ref Range   Sodium 134 (L) 135 - 145 mmol/L   Potassium 4.8 3.5 - 5.1 mmol/L   Chloride 99 98 - 111 mmol/L   CO2 24 22 - 32 mmol/L   Glucose, Bld 125 (H) 70 - 99 mg/dL    Comment: Glucose reference range applies only to samples taken after fasting for at least 8 hours.   BUN 19 8 - 23 mg/dL   Creatinine, Ser 2.52 (H) 0.61 - 1.24 mg/dL   Calcium 8.6 (L) 8.9 - 10.3 mg/dL   GFR calc non Af Amer 24 (L) >60 mL/min   GFR calc Af Amer 28 (L) >60 mL/min   Anion gap 11 5 - 15    Comment: Performed at [facility] [address], [place], [place] [address]  CBC with Differential/Platelet     Status: Abnormal   Collection Time: 11/03/19  2:30 PM  Result Value Ref Range   WBC 12.5 (H) 4.0 - 10.5 K/uL   RBC 3.04 (L) 4.22 - 5.81 MIL/uL   Hemoglobin 8.8 (L) 13.0 - 17.0 g/dL   HCT 29.6 (L) 39.0 - 52.0 %   MCV 97.4 80.0 - 100.0 fL   MCH 28.9 26.0 - 34.0 pg   MCHC 29.7 (L) 30.0 - 36.0 g/dL   RDW 15.5 11.5 - 15.5 %   Platelets 303 150 - 400 K/uL   nRBC 0.3 (H) 0.0 - 0.2 %   Neutrophils Relative % 72 %   Neutro Abs 9.0 (H) 1.7 - 7.7 K/uL   Lymphocytes Relative 15 %   Lymphs Abs 1.9 0.7 - 4.0 K/uL   Monocytes Relative 10 %   Monocytes Absolute 1.3 (H) 0.1 - 1.0 K/uL   Eosinophils Relative 1 %   Eosinophils Absolute 0.2 0.0 - 0.5 K/uL   Basophils Relative 1 %   Basophils Absolute 0.1 0.0 - 0.1 K/uL   Immature Granulocytes 1 %   Abs Immature Granulocytes 0.10 (H) 0.00 - 0.07 K/uL    Comment: Performed at [facility] [address], [place], [place] [address]  Blood gas, arterial     Status: Abnormal   Collection Time: 11/03/19  2:45 PM  Result Value Ref Range   FIO2 40.00     pH, Arterial 7.449 7.350 - 7.450   pCO2 arterial 36.1 32.0 - 48.0 mmHg   pO2, Arterial 79.2 (L) 83.0 - 108.0 mmHg   Bicarbonate 24.6 20.0 - 28.0 mmol/L   Acid-Base Excess 1.0 0.0 - 2.0 mmol/L   O2 Saturation 95.8 %   Patient temperature 36.8    Collection site RIGHT RADIAL    Drawn by [id number]     Comment: COLLECTED BY RT   Sample type ARTERIAL DRAW    Allens test (pass/fail) PASS PASS    Comment: Performed at [facility], [place] [address], [place], [place] [address]  Glucose, capillary     Status: Abnormal   Collection Time: 11/03/19  2:53 PM  Result Value Ref Range   Glucose-Capillary 104 (H) 70 - 99 mg/dL    Comment: Glucose reference range applies only to samples taken after fasting for at least 8 hours.  Ammonia     Status: None  Collection Time: 11/03/19  4:42 PM  Result Value Ref Range   Ammonia 20 9 - 35 umol/L    Comment: Performed at [facility], [place] [address] [place], [place] [address]  TSH     Status: None   Collection Time: 11/03/19  4:42 PM  Result Value Ref Range   TSH 0.683 0.350 - 4.500 uIU/mL    Comment: Performed by a 3rd Generation assay with a functional sensitivity of <=0.01 uIU/mL. Performed at [facility], [place] [address] [place], [place] [address]   CBC     Status: Abnormal   Collection Time: 11/03/19  7:39 PM  Result Value Ref Range   WBC 12.7 (H) 4.0 - 10.5 K/uL   RBC 3.26 (L) 4.22 - 5.81 MIL/uL   Hemoglobin 9.7 (L) 13.0 - 17.0 g/dL   HCT 32.0 (L) 39.0 - 52.0 %   MCV 98.2 80.0 - 100.0 fL   MCH 29.8 26.0 - 34.0 pg   MCHC 30.3 30.0 - 36.0 g/dL   RDW 15.4 11.5 - 15.5 %   Platelets 316 150 - 400 K/uL   nRBC 0.2 0.0 - 0.2 %    Comment: Performed at [facility], [place] [address], [place], [place] [address]  Lactic acid, plasma     Status: None   Collection Time: 11/03/19  7:39 PM  Result Value Ref Range   Lactic Acid, Venous 1.7 0.5 - 1.9 mmol/L    Comment: Performed at [facility] [address]  [place], [place] [address]  Glucose, capillary     Status: Abnormal   Collection Time: 11/03/19  8:14 PM  Result Value Ref Range   Glucose-Capillary 192 (H) 70 - 99 mg/dL    Comment: Glucose reference range applies only to samples taken after fasting for at least 8 hours.  CBC with Differential/Platelet     Status: Abnormal   Collection Time: 11/04/19 12:37 AM  Result Value Ref Range   WBC 15.5 (H) 4.0 - 10.5 K/uL   RBC 3.47 (L) 4.22 - 5.81 MIL/uL   Hemoglobin 10.2 (L) 13.0 - 17.0 g/dL   HCT 34.2 (L) 39.0 - 52.0 %   MCV 98.6 80.0 - 100.0 fL   MCH 29.4 26.0 - 34.0 pg   MCHC 29.8 (L) 30.0 - 36.0 g/dL   RDW 15.8 (H) 11.5 - 15.5 %   Platelets 365 150 - 400 K/uL   nRBC 0.3 (H) 0.0 - 0.2 %   Neutrophils Relative % 79 %   Neutro Abs 12.3 (H) 1.7 - 7.7 K/uL   Lymphocytes Relative 11 %   Lymphs Abs 1.8 0.7 - 4.0 K/uL   Monocytes Relative 8 %   Monocytes Absolute 1.2 (H) 0.1 - 1.0 K/uL   Eosinophils Relative 0 %   Eosinophils Absolute 0.0 0.0 - 0.5 K/uL   Basophils Relative 1 %   Basophils Absolute 0.1 0.0 - 0.1 K/uL   Immature Granulocytes 1 %   Abs Immature Granulocytes 0.14 (H) 0.00 - 0.07 K/uL    Comment: Performed at [facility], [address] [place], [place] [address]  Lactic acid, plasma     Status: Abnormal   Collection Time: 11/04/19 12:37 AM  Result Value Ref Range   Lactic Acid, Venous 3.6 (HH) 0.5 - 1.9 mmol/L    Comment: CRITICAL RESULT CALLED TO, READ BACK BY AND VERIFIED WITH: RN S FLETCHER @0130  11/04/19 BY S GEZAHEGN Performed at [facility], [address] [place], [place] [address]   Comprehensive metabolic panel     Status: Abnormal   Collection Time: 11/04/19 12:37 AM  Result Value Ref Range   Sodium 130 (L) 135 - 145 mmol/L   Potassium 6.1 (H) 3.5 - 5.1 mmol/L   Chloride 96 (L) 98 - 111 mmol/L   CO2 19 (L) 22 - 32 mmol/L   Glucose, Bld 229 (H) 70 - 99 mg/dL    Comment: Glucose reference range applies only to samples taken after fasting for at  least 8 hours.   BUN 25 (H) 8 - 23 mg/dL   Creatinine, Ser 3.38 (H) 0.61 - 1.24 mg/dL   Calcium 8.5 (L) 8.9 - 10.3 mg/dL   Total Protein 7.0 6.5 - 8.1 g/dL   Albumin 3.2 (L) 3.5 - 5.0 g/dL   AST 72 (H) 15 - 41 U/L   ALT 59 (H) 0 - 44 U/L   Alkaline Phosphatase 36 (L) 38 - 126 U/L   Total Bilirubin 0.8 0.3 - 1.2 mg/dL   GFR calc non Af Amer 17 (L) >60 mL/min   GFR calc Af Amer 19 (L) >60 mL/min   Anion gap 15 5 - 15    Comment: Performed at [facility] [address] [place], [place] [address]  Lactic acid, plasma     Status: Abnormal   Collection Time: 11/04/19  3:43 AM  Result Value Ref Range   Lactic Acid, Venous 3.6 (HH) 0.5 - 1.9 mmol/L    Comment: CRITICAL VALUE NOTED.  VALUE IS CONSISTENT WITH PREVIOUSLY REPORTED AND CALLED VALUE. Performed at [facility], [place] [address], [place], [place] [address]   Basic metabolic panel     Status: Abnormal   Collection Time: 11/04/19  3:43 AM  Result Value Ref Range   Sodium 133 (L) 135 - 145 mmol/L   Potassium 6.3 (HH) 3.5 - 5.1 mmol/L    Comment: CRITICAL RESULT CALLED TO, READ BACK BY AND VERIFIED WITH: RN S FLETCHER @0542  11/04/19 BY S GEZAHEGN    Chloride 96 (L) 98 - 111 mmol/L   CO2 21 (L) 22 - 32 mmol/L   Glucose, Bld 223 (H) 70 - 99 mg/dL    Comment: Glucose reference range applies only to samples taken after fasting for at least 8 hours.   BUN 27 (H) 8 - 23 mg/dL   Creatinine, Ser 3.69 (H) 0.61 - 1.24 mg/dL   Calcium 8.4 (L) 8.9 - 10.3 mg/dL   GFR calc non Af Amer 15 (L) >60 mL/min   GFR calc Af Amer 18 (L) >60 mL/min   Anion gap 16 (H) 5 - 15    Comment: Performed at [facility] [address], [place], [place] [address]  CBC with Differential/Platelet     Status: Abnormal   Collection Time: 11/04/19  3:43 AM  Result Value Ref Range   WBC 14.5 (H) 4.0 - 10.5 K/uL   RBC 3.26 (L) 4.22 - 5.81 MIL/uL   Hemoglobin 9.5 (L) 13.0 - 17.0 g/dL   HCT 32.1 (L) 39.0 - 52.0 %   MCV 98.5 80.0 - 100.0 fL   MCH  29.1 26.0 - 34.0 pg   MCHC 29.6 (L) 30.0 - 36.0 g/dL   RDW 15.9 (H) 11.5 - 15.5 %   Platelets 361 150 - 400 K/uL   nRBC 0.1 0.0 - 0.2 %   Neutrophils Relative % 82 %   Neutro Abs 12.0 (H)  1.7 - 7.7 K/uL   Lymphocytes Relative 8 %   Lymphs Abs 1.1 0.7 - 4.0 K/uL   Monocytes Relative 8 %   Monocytes Absolute 1.1 (H) 0.1 - 1.0 K/uL   Eosinophils Relative 0 %   Eosinophils Absolute 0.0 0.0 - 0.5 K/uL   Basophils Relative 1 %   Basophils Absolute 0.1 0.0 - 0.1 K/uL   Immature Granulocytes 1 %   Abs Immature Granulocytes 0.19 (H) 0.00 - 0.07 K/uL    Comment: Performed at [facility] [address], [place], [place] [address]  Troponin I (High Sensitivity)     Status: Abnormal   Collection Time: 11/04/19  3:43 AM  Result Value Ref Range   Troponin I (High Sensitivity) 4,560 (HH) <18 ng/L    Comment: CRITICAL RESULT CALLED TO, READ BACK BY AND VERIFIED WITH: RN S FLETCHER @0542  11/04/19 BY S GEZAHEGN (NOTE) Elevated high sensitivity troponin I (hsTnI) values and significant  changes across serial measurements may suggest ACS but many other  chronic and acute conditions are known to elevate hsTnI results.  Refer to the Links section for chest pain algorithms and additional  guidance. Performed at [facility], [place] [address], [place], [place] [address]   Glucose, capillary     Status: Abnormal   Collection Time: 11/04/19  7:52 AM  Result Value Ref Range   Glucose-Capillary 201 (H) 70 - 99 mg/dL    Comment: Glucose reference range applies only to samples taken after fasting for at least 8 hours.  Urinalysis, Routine w reflex microscopic     Status: Abnormal   Collection Time: 11/04/19  7:58 AM  Result Value Ref Range   Color, Urine AMBER (A) YELLOW    Comment: BIOCHEMICALS MAY BE AFFECTED BY COLOR   APPearance CLOUDY (A) CLEAR   Specific Gravity, Urine 1.019 1.005 - 1.030   pH 5.0 5.0 - 8.0   Glucose, UA 50 (A) NEGATIVE mg/dL   Hgb urine dipstick LARGE (A) NEGATIVE    Bilirubin Urine NEGATIVE NEGATIVE   Ketones, ur NEGATIVE NEGATIVE mg/dL   Protein, ur >=300 (A) NEGATIVE mg/dL   Nitrite NEGATIVE NEGATIVE   Leukocytes,Ua TRACE (A) NEGATIVE   RBC / HPF 21-50 0 - 5 RBC/hpf   WBC, UA 21-50 0 - 5 WBC/hpf   Bacteria, UA RARE (A) NONE SEEN   Squamous Epithelial / LPF 0-5 0 - 5   Mucus PRESENT    Hyaline Casts, UA PRESENT     Comment: Performed at [facility], [address], [place], [place] [address]  Lactic acid, plasma     Status: Abnormal   Collection Time: 11/04/19  8:18 AM  Result Value Ref Range   Lactic Acid, Venous 3.5 (HH) 0.5 - 1.9 mmol/L    Comment: CRITICAL VALUE NOTED.  VALUE IS CONSISTENT WITH PREVIOUSLY REPORTED AND CALLED VALUE. Performed at [facility], [place] [address] [place], [place] [address]   Basic metabolic panel     Status: Abnormal   Collection Time: 11/04/19  8:18 AM  Result Value Ref Range   Sodium 130 (L) 135 - 145 mmol/L   Potassium 5.8 (H) 3.5 - 5.1 mmol/L   Chloride 97 (L) 98 - 111 mmol/L   CO2 19 (L) 22 - 32 mmol/L   Glucose, Bld 214 (H) 70 - 99 mg/dL    Comment: Glucose reference range applies only to samples taken after fasting for at least 8 hours.   BUN  31 (H) 8 - 23 mg/dL   Creatinine, Ser 3.86 (H) 0.61 - 1.24 mg/dL   Calcium 8.2 (L) 8.9 - 10.3 mg/dL   GFR calc non Af Amer 14 (L) >60 mL/min   GFR calc Af Amer 17 (L) >60 mL/min   Anion gap 14 5 - 15    Comment: Performed at [facility] [address], [place], [place] [address]  Troponin I (High Sensitivity)     Status: Abnormal   Collection Time: 11/04/19  8:18 AM  Result Value Ref Range   Troponin I (High Sensitivity) 5,652 (HH) <18 ng/L    Comment: CRITICAL RESULT CALLED TO, READ BACK BY AND VERIFIED WITH: K FRALEY,RN 11/04/2019 [address] [place] (NOTE) Elevated high sensitivity troponin I (hsTnI) values and significant  changes across serial measurements may suggest ACS but many other  chronic and acute conditions are known to  elevate hsTnI results.  Refer to the Links section for chest pain algorithms and additional  guidance. Performed at [facility], [place] [address] [place], [place] [address]   Lactic acid, plasma     Status: Abnormal   Collection Time: 11/04/19 10:19 AM  Result Value Ref Range   Lactic Acid, Venous 3.3 (HH) 0.5 - 1.9 mmol/L    Comment: CRITICAL VALUE NOTED.  VALUE IS CONSISTENT WITH PREVIOUSLY REPORTED AND CALLED VALUE. Performed at [facility], [place] [address], [place], [place] [address]   Troponin I (High Sensitivity)     Status: Abnormal   Collection Time: 11/04/19 10:19 AM  Result Value Ref Range   Troponin I (High Sensitivity) 5,140 (HH) <18 ng/L    Comment: CRITICAL VALUE NOTED.  VALUE IS CONSISTENT WITH PREVIOUSLY REPORTED AND CALLED VALUE. (NOTE) Elevated high sensitivity troponin I (hsTnI) values and significant  changes across serial measurements may suggest ACS but many other  chronic and acute conditions are known to elevate hsTnI results.  Refer to the Links section for chest pain algorithms and additional  guidance. Performed at [facility], [place] [address], [place], [place] [address]   Basic metabolic panel     Status: Abnormal   Collection Time: 11/04/19 10:19 AM  Result Value Ref Range   Sodium 131 (L) 135 - 145 mmol/L   Potassium 5.4 (H) 3.5 - 5.1 mmol/L   Chloride 99 98 - 111 mmol/L   CO2 19 (L) 22 - 32 mmol/L   Glucose, Bld 239 (H) 70 - 99 mg/dL    Comment: Glucose reference range applies only to samples taken after fasting for at least 8 hours.   BUN 34 (H) 8 - 23 mg/dL   Creatinine, Ser 3.84 (H) 0.61 - 1.24 mg/dL   Calcium 8.1 (L) 8.9 - 10.3 mg/dL   GFR calc non Af Amer 14 (L) >60 mL/min   GFR calc Af Amer 17 (L) >60 mL/min   Anion gap 13 5 - 15    Comment: Performed at [facility] [address] [place], [place] [address]  Glucose, capillary     Status: Abnormal   Collection Time: 11/04/19 11:25 AM  Result Value Ref Range    Glucose-Capillary 219 (H) 70 - 99 mg/dL    Comment: Glucose reference range applies only to samples taken after fasting for at least 8 hours.    CT ABDOMEN PELVIS WO CONTRAST  Result Date: 11/04/2019 CLINICAL DATA:  Lower abdominal pain with suprapubic pain. Fever. Peritonitis or perforation suspected EXAM: CT ABDOMEN AND PELVIS  WITHOUT CONTRAST TECHNIQUE: Multidetector CT imaging of the abdomen and pelvis was performed following the standard protocol without IV contrast. COMPARISON:  10/13/2018 FINDINGS: Lower chest: Small pleural effusions with lower lobe atelectasis, increased from before. There is some high-density dependent component on the right which may have been present on prior and reflect scarring. No history to implicate pleural hemorrhage. Coronary atherosclerosis. Hepatobiliary: No focal liver abnormality.No evidence of biliary obstruction or stone. Pancreas: Generalized atrophy he. Mild fat stranding around the midline pancreas. Spleen: Unremarkable. Adrenals/Urinary Tract: Negative adrenals. Right nephrectomy. No left hydronephrosis. Unremarkable bladder. Stomach/Bowel: Proximal and distal gastric into luminal clips from recent polypectomy. No pneumoperitoneum or visible bowel wall thickening, including at the duodenum or splenic flexure. Presumed medial duodenal bulb diverticulum. No bowel obstruction. Vascular/Lymphatic: Diffuse atherosclerotic plaque. Fusiform infrarenal abdominal aortic aneurysm evaluated by comparison CTA. No mass or adenopathy. Reproductive:No pathologic findings. Other: There is fluid density and stranding in the right infra hepatic space or posterior pararenal space from uncertain source. Trace pelvic peritoneal fluid. No pneumoperitoneum. Musculoskeletal: No acute finding. Lumbar spine degeneration with severe L4-5 spinal stenosis. IMPRESSION: 1. Right infra hepatic/posterior pararenal fat stranding without definite source. No extraluminal gas or bowel wall  thickening to implicate recent endoscopy. Some stranding seen around the midline pancreas, please correlate with serum enzymes. 2. Small pleural effusions and atelectasis that have increased from admission chest CT. Electronically Signed   By: Monte Fantasia M.D.   On: 11/04/2019 05:04   DG Abd 1 View  Result Date: 11/03/2019 CLINICAL DATA:  Abdominal pain EXAM: ABDOMEN - 1 VIEW COMPARISON:  02/11/2011 FINDINGS: Two supine frontal views of the abdomen and pelvis are obtained, limited by portable AP technique as well as body habitus. No bowel obstruction or ileus. No masses or abnormal calcifications. No acute bony abnormalities. IMPRESSION: 1. Unremarkable bowel gas pattern. Electronically Signed   By: Randa Ngo M.D.   On: 11/03/2019 23:04   CT HEAD WO CONTRAST  Result Date: 11/03/2019 CLINICAL DATA:  Weakness aphasia EXAM: CT HEAD WITHOUT CONTRAST TECHNIQUE: Contiguous axial images were obtained from the base of the skull through the vertex without intravenous contrast. COMPARISON:  CT head 08/15/2017 FINDINGS: Brain: Moderate atrophy. Negative for hydrocephalus. Patchy hypodensity in the white matter is unchanged and compatible with chronic microvascular ischemia. Negative for acute infarct, hemorrhage, mass. Vascular: Negative for hyperdense vessel. Atherosclerotic calcification in the carotid and vertebral arteries bilaterally. Skull: Negative Sinuses/Orbits: Extensive sinus surgery with mucosal edema throughout the paranasal sinuses. 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 ASPECTS [facility] Stroke Program Early CT Score) - Ganglionic level infarction (caudate, lentiform nuclei, internal capsule, insula, M1-M3 cortex): 7 - Supraganglionic infarction (M4-M6 cortex): 3 Total score (0-10 with 10 being normal): 10 IMPRESSION: 1. No acute abnormality 2. Atrophy and chronic microvascular ischemia 3. ASPECTS is 10 4. Preliminary report texted to Dr.Aroor  via Shea Evans Electronically Signed   By: Franchot Gallo M.D.   On: 11/03/2019 15:34   DG CHEST PORT 1 VIEW  Result Date: 11/03/2019 CLINICAL DATA:  Hypoxia EXAM: PORTABLE CHEST 1 VIEW COMPARISON:  11/01/2019 FINDINGS: Cardiac enlargement with changes of CABG. Mild progression of vascular congestion and interstitial edema. Small right pleural effusion. Mild right lower lobe atelectasis IMPRESSION: Congestive heart failure with mild interval progression. Electronically Signed   By: Franchot Gallo M.D.   On: 11/03/2019 16:17    Assessment/Plan **AKI on CKD 4:  Quite advanced CKD at baseline now with AKI in setting  of acute blood loss anemia, hypotension/shock.  I suspect this is ATN from hemodynamic insults.  No contrast or other exposure.  UA looks like UTI, culture pending. 5/26 CT A/P with unremarkable kidney and bladder, no need for renal US.  No indications for RRT at this time but may come up.  We discussed and he says he will consider dialysis only as last resort.  Cont supportive care to maintain renal perfusion, achieve euvolemia - would hold IVF and diuretics at the moment, avoid nephrotoxins.  Will follow closely.   **Shock: hypotensive (has baseline low BPs of note), elevated lactate. Does not appear hypovolemic. Tm 99.2, WBC mildly elevated, UA looks infected culture pending; on CTX .  TTE this admit EF 50-55%.  Midodrine has been increased. Per primary.   **NSTEMI, h/o CAD with h/o PCI:  Cardiology following, holding on LHC for now in light of GIB and AKI.   **Anemia: acute blood loss s/p resection of gastric polyps.  Transfusion per primary.   **Hyperkalemia: tx with lokelma 10 and kayexelate 45g; improved.  4pm K ordered.    **Atrial fib: per cardiology.  Justin Mend 11/04/2019, 11:32 AM

## 2019-11-04 NOTE — Progress Notes (Signed)
RT placed pt on cpap for the night. Pt seemed to be tolerating well, RT explained to pt and son if he wanted to take cpap off during the night to let RN know so they could place pt back on [place].

## 2019-11-04 NOTE — Progress Notes (Signed)
Progress Note  Patient Name: Matthew [name] Date of Encounter: 11/04/2019  Primary Cardiologist: Larae Grooms, MD   Subjective   Rough day yesterday. MS changes no stroke on CT. Mild CHF low BP and worsening renal function Better this am and now in NSR   Inpatient Medications    Scheduled Meds: . sodium chloride   Intravenous Once  . buPROPion  300 mg Oral Daily  . Chlorhexidine Gluconate Cloth  6 each Topical Daily  . dronedarone  400 mg Oral BID WC  . ezetimibe  5 mg Oral QHS  . ferrous sulfate  325 mg Oral Q M,W,F  . finasteride  5 mg Oral QHS  . fluticasone  2 spray Each Nare Daily  . gabapentin  300 mg Oral BID  . insulin aspart  0-15 Units Subcutaneous TID WC  . insulin aspart  0-5 Units Subcutaneous QHS  . insulin detemir  25 Units Subcutaneous BID  . levothyroxine  150 mcg Oral QAC breakfast  . magnesium oxide  400 mg Oral BID  . melatonin  3 mg Oral QHS  . metoprolol tartrate  50 mg Oral TID  . midodrine  5 mg Oral TID WC  . nortriptyline  50 mg Oral QHS  . pantoprazole  40 mg Oral BID AC  . rosuvastatin  40 mg Oral QHS  . senna  2 tablet Oral q AM   And  . senna  1 tablet Oral QHS  . sertraline  25 mg Oral BH-q7a  . sodium chloride flush  3 mL Intravenous Q12H  . sodium polystyrene  45 g Oral Once  . sodium zirconium cyclosilicate  10 g Oral TID  . tamsulosin  0.4 mg Oral Daily  . traZODone  100 mg Oral QHS   Continuous Infusions: . cefTRIAXone (ROCEPHIN)  IV 1 g (11/04/19 0347)  . nitroGLYCERIN Stopped (11/03/19 1438)   PRN Meds: acetaminophen, alum & mag hydroxide-simeth, lidocaine, morphine injection, nitroGLYCERIN, polyethylene glycol, simethicone, sodium chloride, triamcinolone cream   Vital Signs    Vitals:   11/04/19 0148 11/04/19 0149 11/04/19 0215 11/04/19 0632  BP: 100/69 100/69  108/72  Pulse:  (!) 58  (!) 57  Resp:  (!) 28  18  Temp: (!) 97.5 F (36.4 C) (!) 97.5 F (36.4 C) 99.2 F (37.3 C) 97.8 F (36.6 C)  TempSrc:  Oral Oral Rectal Oral  SpO2:  100%  100%  Weight:    129.4 kg  Height:        Intake/Output Summary (Last 24 hours) at 11/04/2019 0923 Last data filed at 11/04/2019 0400 Gross per 24 hour  Intake 1502.8 ml  Output 1000 ml  Net 502.8 ml   Last 3 Weights 11/04/2019 11/03/2019 10/10/2019  Weight (lbs) 285 lb 4.4 oz 276 lb 11.2 oz 271 lb 14.4 oz  Weight (kg) 129.4 kg 125.51 kg 123.333 kg      Telemetry    Atrial fibrillation, variable rates 10/28/2019   ECG    Atrial fibrillation nonspecific ST changes  11/01/19   Physical Exam   Affect appropriate Obese male  HEENT: normal Neck supple with no adenopathy JVP normal no bruits no thyromegaly Lungs clear with no wheezing and good diaphragmatic motion Heart:  S1/S2 no murmur, no rub, gallop or click PMI normal post sternotomy  Abdomen: benighn, BS positve, no tenderness, no AAA no bruit.  No HSM or HJR Distal pulses intact with no bruits No edema Neuro non-focal Skin warm and dry No muscular weakness  Labs    High Sensitivity Troponin:   Recent Labs  Lab 10/23/2019 1344 10/28/2019 1702 11/01/19 1042 11/01/19 1205 11/04/19 0343  TROPONINIHS 2,635* 4,646* 1,042* 882* 4,560*      Chemistry Recent Labs  Lab 11/03/19 1430 11/04/19 0037 11/04/19 0343  NA 134* 130* 133*  K 4.8 6.1* 6.3*  CL 99 96* 96*  CO2 24 19* 21*  GLUCOSE 125* 229* 223*  BUN 19 25* 27*  CREATININE 2.52* 3.38* 3.69*  CALCIUM 8.6* 8.5* 8.4*  PROT  --  7.0  --   ALBUMIN  --  3.2*  --   AST  --  72*  --   ALT  --  59*  --   ALKPHOS  --  36*  --   BILITOT  --  0.8  --   GFRNONAA 24* 17* 15*  GFRAA 28* 19* 18*  ANIONGAP 11 15 16*     Hematology Recent Labs  Lab 11/03/19 1939 11/04/19 0037 11/04/19 0343  WBC 12.7* 15.5* 14.5*  RBC 3.26* 3.47* 3.26*  HGB 9.7* 10.2* 9.5*  HCT 32.0* 34.2* 32.1*  MCV 98.2 98.6 98.5  MCH 29.8 29.4 29.1  MCHC 30.3 29.8* 29.6*  RDW 15.4 15.8* 15.9*  PLT 316 365 361     Radiology    CT ABDOMEN PELVIS  WO CONTRAST  Result Date: 11/04/2019 CLINICAL DATA:  Lower abdominal pain with suprapubic pain. Fever. Peritonitis or perforation suspected EXAM: CT ABDOMEN AND PELVIS WITHOUT CONTRAST TECHNIQUE: Multidetector CT imaging of the abdomen and pelvis was performed following the standard protocol without IV contrast. COMPARISON:  10/13/2018 FINDINGS: Lower chest: Small pleural effusions with lower lobe atelectasis, increased from before. There is some high-density dependent component on the right which may have been present on prior and reflect scarring. No history to implicate pleural hemorrhage. Coronary atherosclerosis. Hepatobiliary: No focal liver abnormality.No evidence of biliary obstruction or stone. Pancreas: Generalized atrophy he. Mild fat stranding around the midline pancreas. Spleen: Unremarkable. Adrenals/Urinary Tract: Negative adrenals. Right nephrectomy. No left hydronephrosis. Unremarkable bladder. Stomach/Bowel: Proximal and distal gastric into luminal clips from recent polypectomy. No pneumoperitoneum or visible bowel wall thickening, including at the duodenum or splenic flexure. Presumed medial duodenal bulb diverticulum. No bowel obstruction. Vascular/Lymphatic: Diffuse atherosclerotic plaque. Fusiform infrarenal abdominal aortic aneurysm evaluated by comparison CTA. No mass or adenopathy. Reproductive:No pathologic findings. Other: There is fluid density and stranding in the right infra hepatic space or posterior pararenal space from uncertain source. Trace pelvic peritoneal fluid. No pneumoperitoneum. Musculoskeletal: No acute finding. Lumbar spine degeneration with severe L4-5 spinal stenosis. IMPRESSION: 1. Right infra hepatic/posterior pararenal fat stranding without definite source. No extraluminal gas or bowel wall thickening to implicate recent endoscopy. Some stranding seen around the midline pancreas, please correlate with serum enzymes. 2. Small pleural effusions and atelectasis that  have increased from admission chest CT. Electronically Signed   By: Monte Fantasia M.D.   On: 11/04/2019 05:04   DG Abd 1 View  Result Date: 11/03/2019 CLINICAL DATA:  Abdominal pain EXAM: ABDOMEN - 1 VIEW COMPARISON:  02/11/2011 FINDINGS: Two supine frontal views of the abdomen and pelvis are obtained, limited by portable AP technique as well as body habitus. No bowel obstruction or ileus. No masses or abnormal calcifications. No acute bony abnormalities. IMPRESSION: 1. Unremarkable bowel gas pattern. Electronically Signed   By: Randa Ngo M.D.   On: 11/03/2019 23:04   CT HEAD WO CONTRAST  Result Date: 11/03/2019 CLINICAL DATA:  Weakness aphasia EXAM: CT HEAD  WITHOUT CONTRAST TECHNIQUE: Contiguous axial images were obtained from the base of the skull through the vertex without intravenous contrast. COMPARISON:  CT head 08/15/2017 FINDINGS: Brain: Moderate atrophy. Negative for hydrocephalus. Patchy hypodensity in the white matter is unchanged and compatible with chronic microvascular ischemia. Negative for acute infarct, hemorrhage, mass. Vascular: Negative for hyperdense vessel. Atherosclerotic calcification in the carotid and vertebral arteries bilaterally. Skull: Negative Sinuses/Orbits: Extensive sinus surgery with mucosal edema throughout the paranasal sinuses. 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 ASPECTS [facility] Stroke Program Early CT Score) - Ganglionic level infarction (caudate, lentiform nuclei, internal capsule, insula, M1-M3 cortex): 7 - Supraganglionic infarction (M4-M6 cortex): 3 Total score (0-10 with 10 being normal): 10 IMPRESSION: 1. No acute abnormality 2. Atrophy and chronic microvascular ischemia 3. ASPECTS is 10 4. Preliminary report texted to Dr.Aroor via Shea Evans Electronically Signed   By: Franchot Gallo M.D.   On: 11/03/2019 15:34   DG CHEST PORT 1 VIEW  Result Date: 11/03/2019 CLINICAL DATA:  Hypoxia EXAM: PORTABLE CHEST  1 VIEW COMPARISON:  11/01/2019 FINDINGS: Cardiac enlargement with changes of CABG. Mild progression of vascular congestion and interstitial edema. Small right pleural effusion. Mild right lower lobe atelectasis IMPRESSION: Congestive heart failure with mild interval progression. Electronically Signed   By: Franchot Gallo M.D.   On: 11/03/2019 16:17    Cardiac Studies   Echo 10/30/19:  1. Left ventricular ejection fraction, by estimation, is 50 to 55%. The  left ventricle has low normal function. Left ventricular endocardial  border not optimally defined to evaluate regional wall motion. There is  moderate left ventricular hypertrophy.  Left ventricular diastolic parameters are indeterminate. Elevated left  ventricular end-diastolic pressure.  2. Right ventricular systolic function is mildly reduced. The right  ventricular size is moderately enlarged. There is moderately elevated  pulmonary artery systolic pressure.  3. The mitral valve is degenerative. Trivial mitral valve regurgitation.  No evidence of mitral stenosis.  4. The aortic valve is abnormal. Aortic valve regurgitation is not  visualized. Aortic valve mean gradient measures 7.0 mmHg. Aortic valve has  moderately reduced cusp mobility, suggesting gradient may be  underestimated, with at least mild aortic valve  stenosis.  5. The inferior vena cava is normal in size with greater than 50%  respiratory variability, suggesting right atrial pressure of 3 mmHg.    Cardiac cath 10/15/18   Prox RCA lesion is 100% stenosed.  SVG graft was visualized by angiography and is normal in caliber.  Prox Cx to Mid Cx lesion is 100% stenosed.  SVG graft was visualized by angiography and is normal in caliber.  Ost LAD to Prox LAD lesion is 100% stenosed.  LIMA graft was visualized by angiography and is normal in caliber.  SVG graft was not injected.  Mid Graft lesion before 2nd Mrg is 99% stenosed.  A drug-eluting stent was  successfully placed using a STENT RESOLUTE [id number].  Post intervention, there is a 0% residual stenosis.  1. 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 Chronic occlusion proximal LAD. Patent LIMA to mid LAD. Chronic occlusion of the vein graft to the Diagonal branch (not injected-known occlusion) 3. Chronic occlusion mid Circumflex. Patent sequential vein graft to OM2/OM3. Severe stenosis mid body of SVG to OM2/OM3 within the old stent 4. Successful PTCA/DES x 1 mid body of SVG to OM2/OM3 5. Chronic occlusion proximal RCA. Patent SVG to distal RCA  Recommendations: ASA and Plavix along with  Eliquis for one month. Would stop ASA at one month and continue Plavix along with Eliquis for at least one year. Continue statin and beta blocker. Resume Eliquis tomorrow.    Patient Profile     74 y.o. male with a hx of coronary artery disease, CABG, hypertension, hyperlipidemia, paroxysmal atrial fibrillation, hypothyroidism, CKD who presented 11/07/2019 with chest pain and was found to have a GI bleedand  elevated troponins, elevated Cr and acute anemia with drop in Hgb from 16 to 8.  Heme+ stools. Eliquis and Plavix held.   Assessment & Plan    1.  Non-STEMI/CAD: Troponin had been coming down to 888 up again this am. Will add back plavix now that he is 48 hours Post EGD with clipping of bleeding polyp and stable Hct. ECG no acute changes other than rhythm Unfortunately have been unable to cath due to multiple comorbidities including active GI bleed requiring transfusion, MS changes, and now renal failure He has one kidney and Cr 3.69 this am despite attempts at hydration Doubt he will get cath this week  2.  Acute GI bleed: multiple stomach polyps one actively bleeding cauterized  Hct  32.1 this am resume plavix   3.  Paroxysmal atrial fibrillation: Anticoagulation on hold.  Continue Multaq in NSR this am   4.  Hypotension:  Chronic increase midodrine to 10 mg tid   5.   Hyperlipidemia: On rosuvastatin.  6.  Acute kidney injury: Creatinine 3.69 ? From hypotension yesterday Primary service to Rx hyperkalemia. Had some CHF with hydration yesterday consider nephrology consult Now renal failure precludes cath     For questions or updates, please contact [place] Please consult [email or web address] for contact info under        Signed, Jenkins Rouge, MD  11/04/2019, 9:23 AM

## 2019-11-04 NOTE — Progress Notes (Addendum)
NEUROLOGY PROGRESS NOTE   Subjective: Patient is very confused this morning.  He is getting up by himself, pulling IVs out, very agitated.  Exam: Vitals:   11/04/19 0215 11/04/19 0632  BP:  108/72  Pulse:  (!) 57  Resp:  18  Temp: 99.2 F (37.3 C) 97.8 F (36.6 C)  SpO2:  100%    Neuro: -Very limited due to agitation and confusion Mental Status: Patient is very agitated, confused and follows no commands. Cranial Nerves: II: Blinks to threat bilaterally III,IV, VI: extra-ocular motions intact bilaterally V,VII: Face symmetric,  VIII: hearing normal bilaterally Motor: Moving all extremities with 5/5 strength     Medications:  Scheduled: . sodium chloride   Intravenous Once  . buPROPion  300 mg Oral Daily  . Chlorhexidine Gluconate Cloth  6 each Topical Daily  . clopidogrel  75 mg Oral Daily  . dronedarone  400 mg Oral BID WC  . ezetimibe  5 mg Oral QHS  . ferrous sulfate  325 mg Oral Q M,W,F  . finasteride  5 mg Oral QHS  . fluticasone  2 spray Each Nare Daily  . gabapentin  300 mg Oral BID  . insulin aspart  0-15 Units Subcutaneous TID WC  . insulin aspart  0-5 Units Subcutaneous QHS  . insulin detemir  25 Units Subcutaneous BID  . levothyroxine  150 mcg Oral QAC breakfast  . magnesium oxide  400 mg Oral BID  . melatonin  3 mg Oral QHS  . metoprolol tartrate  50 mg Oral TID  . midodrine  10 mg Oral TID WC  . nortriptyline  50 mg Oral QHS  . pantoprazole  40 mg Oral BID AC  . rosuvastatin  40 mg Oral QHS  . senna  2 tablet Oral q AM   And  . senna  1 tablet Oral QHS  . sertraline  25 mg Oral BH-q7a  . sodium chloride flush  3 mL Intravenous Q12H  . sodium polystyrene  45 g Oral Once  . sodium zirconium cyclosilicate  10 g Oral TID  . tamsulosin  0.4 mg Oral Daily  . traZODone  100 mg Oral QHS    Pertinent Labs/Diagnostics:  -Sodium 130 -Potassium 5.8 -BUN 31 -Creatinine 3.86 -Ammonia 20 -TSH 0.683 -CBC shows white blood cell count of 14.5 -Urine  culture and blood culture pending  CT HEAD WO CONTRAST  Result Date: 11/03/2019 IMPRESSION: 1. No acute abnormality 2. Atrophy and chronic microvascular ischemia 3. ASPECTS is 10 4. Preliminary report texted to Dr.Yoshiharu Brassell via [facility] Electronically Signed   By: Franchot Gallo M.D.   On: 11/03/2019 15:34    Etta Quill PA-C [facility] [phone]  Assessment: This is a patient seen yesterday secondary to code stroke.  Patient became very somnolent, speech was slurred and there is question of aphasia.  However on assessment patient was able to repeat sentence, read without difficulty and name objects.  Patient had just received Percocet and felt this was medication related. Patient alert and oriented x3 today but appears slightly confused and more agitated.  Does not appear aphasic.  Impression: Acute toxic metabolic encephalopathy Delirium Acute kidney injury   Recommendations: --Continue to treat metabolic disturbances and minimize pain medications --Frequent neurochecks -Patient will need MRI brain to evaluate for watershed infarcts in addition to possible shower of emboli as he has been taken off his Eliquis due to GI bleed, however this is not urgent  -Cardiology following non-STEMI/CAD -Avoid hypotension   11/04/2019, 10:22  AM  NEUROHOSPITALIST ADDENDUM Performed a face to face diagnostic evaluation.   I have reviewed the contents of history and physical exam as documented by PA/ARNP/Resident and agree with above documentation.  I have discussed and formulated the above plan as documented. Edits to the note have been made as needed.  Patient on my exam appears to be alert and oriented x3, following commands with no weakness or aphasia.  No cranial nerve deficits.  Suspect confusion agitation likely due to delirium.  Patient's BUN increasing and renal on board.  We will continue to minimize sedating medications. MRI brain when feasible.    Karena Addison Sinahi Knights MD [facility] [phone]   If 7pm to 7am, please call on call as listed on AMION.

## 2019-11-04 NOTE — Progress Notes (Addendum)
PROGRESS NOTE    Matthew [name]  ASN:[id number] DOB: 1945/09/08 DOA: 10/24/2019 PCP: Marda Stalker, PA-C    Chief Complaint  Patient presents with  . Chest Pain    Brief Narrative:  Matthew Armor Mitchellis a 74 y.o.malewith medical history significant ofHTN, HLD, PAF on Eliquis, CAD s/pCABGx5v and stents, AAA, hypothyroidism, CKD stage III,chronic pain,and GERD who presented with complaints of chest pain.Patient had been watching the basketball game last night and never really got to sleep with he had acute onset of left-sided chest pain. He described it as dull pain with radiation down his arm. With the chest pain he denied having any diaphoresis, nausea, vomiting, or shortness of breath. Reported having similar symptoms 3 days ago with diaphoresis and nausea, but resolved with nitroglycerin. Last night when he attempted to take a nitroglycerin he had no relief in symptoms. Denies any recent change in weight,leg swelling, abdominal pain in, or diarrhea. Other associated symptoms include chronic shortness of breath unchanged, chronic constipation for which he is on stool softeners, and reported seeing some bright red blood in his stools approximately 3 days ago. He is not on any NSAIDs, but is on Eliquis and Plavix. Patient had a left heart cath by Dr. Angelena Form in 10/2018 which showed severe triple-vessel coronary artery disease status post 5 vessel CABG with 4/5 patent grafts with successful DES to mid body of SVG to OM2/OM3. Patient had prior colonoscopy in 2015 noted 5 sessile polyps which were removed. Endoscopy from 2016 and multiple gastric polyps and Barrett's esophagus.  Patient was given 324 mg of aspirin in route with EMS  ED Course:Upon admission into the emergency department patient was seen to be afebrile with heart rates elevated up to 154, and all other vital signs maintained. Labs significant for hemoglobin 8.8, BUN 22, creatinine 2.13, glucose 243, and  high-sensitivity troponin 61 which has increased to 98 and then 4646 . Stool guaiacs were noted to be positive, but not reported to have gross blood on rectal exam. Chest x-ray showed no acute abnormalities. Patient was given Tylenol and fentanyl. TRH called to admit.  The patient has been admitted to a telemetry bed. Cardiology and gastroenterology  have been consulted. Echocardiogram pending. Plavix and Eliquis have been held for now. Evaluation for possible source of GI bleed will need to be completed before cardiology can entertain LHC which will be necessary. Gastroenterology will perform EGD and colonoscopy planned for Monday when plavix has had a chance to wash out. We will monitor Hgb and transfuse for Hgb greater than 8.0.  The patient has been having complaints of chest pain this morning. SL NTG dropped his pressure abruptly. The patient has been evaluated by cardiology. He was given IV metoprolol, SL nitro, and a GI cocktail without improvement He has been placed on nitropaste and an esmolol drip. I have given the patient morphine to address his discomfort. GI cocktail was not helpful. EKG demonstrated AF with RVR. Morphine given for pain. Cardiology has ordered transfusion as Hgb is 8.1 this morning. This correlated with an elevated troponin at 1042 which has trended down to 882. Cardidology is monitoring carefully. The patient needs LHC, but must have EGC, CSPY first. Cardiology has given the patient IV amiodarone to control rate and placed the patient on a nitro drip. They ordered CXR to evaluate the possibility of volume overload. It showed no abnormality. The patient remains on nitroglycerin gtt due to ongoing chest pain.  The afternoon of 11/03/2019 the patient had an  abrupt change in mental status.  He was found to be hypoxic and hypotensive. He was confused and dysarthric as well as agitated. ABG was performed and demonstrated only a respiratory alkalosis. BMP demonstrated worsening  renal insufficiency with increase in creatinine from 1.93 to 2.52. he was found to be hypotensive. CT head demonstrated some changes that could be consistent with infarction, but Dr Benny Lennert discussed the patient with Dr. Lorraine Lax. He saw the patient and he does not feel that the patient is having an acute infarct. The patient has been given a bolus and will be placed on continual IV fluids once bolus is completed. Diltiazem and nitro have been held.    Assessment & Plan:   Principal Problem:   Chest pain Active Problems:   Hypothyroidism   PAF (paroxysmal atrial fibrillation) (HCC)   CKD (chronic kidney disease), stage III   AAA (abdominal aortic aneurysm) (HCC)   Diabetic polyneuropathy associated with type 2 diabetes mellitus (HCC)   OSA (obstructive sleep apnea)   Prolonged QT interval   Depression   GI bleed   Acute blood loss anemia   Hyperlipidemia LDL goal <70   AKI (acute kidney injury) ([place])   Blood in stool   Stage 4 chronic kidney disease (HCC)   Acute renal failure superimposed on stage 4 chronic kidney disease ([place])   NSTEMI (non-ST elevated myocardial infarction) ([place])  #1 non-STEMI/CAD Patient had presented with chest pain that woke him up from sleep not relieved with nitroglycerin.  Chest x-ray initially on admission noted to be clear.  Patient noted to have a cardiac catheterization 10/29/2018 that showed severe triple-vessel disease with patent grafts and had a DES placed at the mid body of SVG to OM 2/OM 3.  Cardiac enzymes noted to be continuing elevated yesterday increasing and slowly started to trend back down this morning.  Patient denies any ongoing chest pain.  2D echo obtained with a EF of 50 to 55%, moderate LVH, LV endocardial border not optimally defined to evaluate regional wall motion, elevated left ventricular end-diastolic pressure, mildly reduced right ventricular systolic function, right ventricular size moderately enlarged, moderately elevated pulmonary artery  systolic pressure.  Trivial mitral valve regurgitation, aortic valve abnormal.  Patient on admission initially noted to be in A. fib with RVR and currently in normal sinus rhythm.  Patient being followed by cardiology and due to patient's solitary kidney with worsening renal function with creatinine up to 3.69 despite attempts with hydration doubt that patient will likely have a cardiac catheterization this week per cardiology.  Plavix started back per cardiology 48 hours post endoscopy with clipping of bleeding polyp with stable hemoglobin.  Continue statin, Multaq, Zetia, Lopressor.  Increase midodrine dose to 10 mg 3 times daily due to chronic hypotension.  Cardiology following and appreciate input and recommendations.  2.  Acute renal failure on chronic kidney disease stage IV and a solitary kidney Questionable etiology.  Creatinine currently at 3.69 from 1.96 (10/30/2019 ). Likely secondary to problem #1 with probable volume overload in the setting of chronic hypotension and poor renal perfusion.  CT abdomen and pelvis which was done was negative for hydronephrosis and as such we will hold off on renal ultrasound.  Check urine sodium, urine creatinine.  Increase midodrine to 10 mg 3 times daily.  Avoid nephrotoxin agents.  Due to solitary kidney and worsening renal function will consult with nephrology for further evaluation and management.  Follow.  3.  Paroxysmal atrial fibrillation Patient currently in normal sinus  rhythm this morning.  Continue Multaq and Lopressor for rate control.  Anticoagulation on hold secondary to recent acute GI bleed.  Plavix resumed per cardiology.  Per cardiology.  4.  Chronic hypotension Patient with low blood pressure with systolics in the 41L to the 90s.  Increase midodrine to 10 mg 3 times daily.  Follow.  5.  Hyperlipidemia Continue statin.  6.  Upper GI bleed/acute blood loss anemia Patient noted to have a previous baseline hemoglobin of approximately 15.1 on  08/22/2019.  Patient presented with hemoglobin which dropped as low as 8.8.  FOBT positive.  Patient's Eliquis and Plavix held.  Patient seen in consultation by GI and patient underwent colonoscopy and upper endoscopy.  Upper endoscopy showed medium hiatal hernia, multiple gastric polyps 1 which was large and oozing blood and one smaller with stigmata of recent bleeding status post resection and clipping.  Gastritis also noted.  Colonoscopy showed nonbleeding hemorrhoids.  Patient also with multiple 2 to 6 mm polyps throughout the colon.  GI had recommended to restart patient's Eliquis and Plavix at the latest date acceptable to cardiology.  Status post IV Feraheme x1.  Status post 1 unit transfusion packed red blood cells 11/01/2019.  H&H stable.  Monitor closely as Plavix has been resumed.  Continue PPI twice daily.  Will need outpatient follow-up with GI.  7.  Diabetes mellitus type II with polyneuropathy Hemoglobin A1c of 7.6 on 08/22/2019.  CBG of 201 this morning.  Continue current dose of Levemir, sliding scale insulin.  8.  UTI Urinalysis done cloudy, trace leukocytes, nitrite negative, 21-50 WBCs.  Patient noted to be hypotensive yesterday.  Patient noted to have a leukocytosis.  Patient pancultured.  Patient started empirically on IV Rocephin.  Follow.  9.  Acute metabolic encephalopathy Questionable etiology.  Patient noted with some dysarthric speech, confusion and agitation.  Patient seen by neurology, head CT done with no acute abnormalities noted.  Patient's anticoagulation was held during the hospitalization due to upper GI bleed.  Per neurology patient will need MRI to evaluate for watershed infarcts.  Avoid hypotension.  Concerned that patient's confusion/agitation likely due to hospital delirium.  Minimize sedating medications.  Patient pancultured.  Continue empiric IV Rocephin.  Neurology following and I appreciate the input and recommendations.  10.  Hypothyroidism Synthroid.   Outpatient follow-up.  11.  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  Will likely need follow-up surveillance this month or next month which may be done in the outpatient setting.  12.  Depression Continue Zoloft, Wellbutrin, nortriptyline, trazodone.  Dosages adjusted per pharmacy due to worsening renal function.  13.  History of memory loss Patient noted to recently have been started on Namenda.  14.  Hyperlipidemia Continue statin, fenofibrate, Zetia.  15.  Gastroesophageal reflux disease On PPI.  16.  OSA on CPAP CPAP nightly.  17.  Hyperkalemia Likely secondary to worsening renal function.  Patient currently on [place] 3 times daily.  We will give a dose of Kayexalate 45 g p.o. x1.  Repeat labs in the afternoon.    DVT prophylaxis: SCDs/recent GI bleed Code Status: Full Family Communication: Updated patient and wife at bedside. Disposition:   Status is: Inpatient    Dispo: The patient is from: Home              Anticipated d/c is to: To be determined  Anticipated d/c date is: To be determined              Patient currently in acute on chronic kidney disease stage IV, non-STEMI, hypotension, cardiology and nephrology consulted.        Consultants:   Nephrology pending  Neurology: Dr.Aroor 11/03/2019  Cardiology: Dr.Nahser 10/16/2019  Gastroenterology: Dr. Ardis Hughs 11/07/2019  Procedures:  CT abdomen and pelvis 11/04/2019  CT head without contrast 11/03/2019  Chest x-ray 10/17/2019, 11/01/2019, 11/03/2019  Abdominal films 11/03/2019  Upper endoscopy 10/28/2019  Colonoscopy 11/03/2019  Antimicrobials:   IV Rocephin 11/04/2019     Subjective: Sitting up in bed.  Wife at bedside.  Denies any chest pain.  No shortness of breath.  No further abdominal pain.  Still with some dysarthric speech however moving extremities spontaneously.  Alert to self place and time.  Patient noted to have some agitation  early on this morning which seems to be slowly improving.  Per wife patient with decreasing urine output. Events overnight noted with worsening confusion, agitation.  Concern also yesterday for acute stroke.  Patient less agitated this morning with wife at bedside alert and oriented x 3.  Objective: Vitals:   11/04/19 0149 11/04/19 0215 11/04/19 0632 11/04/19 1549  BP: 100/69  108/72 (!) 85/60  Pulse: (!) 58  (!) 57 60  Resp: (!) 28  18 (!) 24  Temp: (!) 97.5 F (36.4 C) 99.2 F (37.3 C) 97.8 F (36.6 C) 98.1 F (36.7 C)  TempSrc: Oral Rectal Oral Tympanic  SpO2: 100%  100% 100%  Weight:   129.4 kg   Height:        Intake/Output Summary (Last 24 hours) at 11/04/2019 1815 Last data filed at 11/04/2019 0400 Gross per 24 hour  Intake 1451.97 ml  Output 1000 ml  Net 451.97 ml   Filed Weights   10/20/2019 0451 11/03/19 0338 11/04/19 4098  Weight: 123.3 kg 125.5 kg 129.4 kg    Examination:  General exam: Appears calm and comfortable  Respiratory system: Clear to auscultation. Respiratory effort normal. Cardiovascular system: S1 & S2 heard, RRR. No JVD, murmurs, rubs, gallops or clicks. No pedal edema. Gastrointestinal system: Abdomen is nondistended, soft and nontender. No organomegaly or masses felt. Normal bowel sounds heard. Central nervous system: Alert and oriented.  Some dysarthric speech.  No focal neurological deficits. Extremities: Symmetric 5 x 5 power. Skin: No rashes, lesions or ulcers Psychiatry: Judgement and insight appear normal. Mood & affect appropriate.     Data Reviewed: I have personally reviewed following labs and imaging studies  CBC: Recent Labs  Lab 11/01/19 0326 11/01/19 1656 11/03/19 0401 11/03/19 1430 11/03/19 1939 11/04/19 0037 11/04/19 0343  WBC 7.3  --  9.9 12.5* 12.7* 15.5* 14.5*  NEUTROABS 4.6  --  7.3 9.0*  --  12.3* 12.0*  HGB 8.1*   < > 8.6* 8.8* 9.7* 10.2* 9.5*  HCT 27.2*   < > 28.6* 29.6* 32.0* 34.2* 32.1*  MCV 95.4  --  96.3  97.4 98.2 98.6 98.5  PLT 261  --  265 303 316 365 361   < > = values in this interval not displayed.    Basic Metabolic Panel: Recent Labs  Lab 10/30/19 0400 10/31/19 1030 11/04/19 0037 11/04/19 0343 11/04/19 0818 11/04/19 1019 11/04/19 1633  NA 137   < > 130* 133* 130* 131* 131*  K 4.3   < > 6.1* 6.3* 5.8* 5.4* 5.7*  CL 101   < > 96* 96* 97* 99 100  CO2 27   < > 19* 21* 19* 19* 17*  GLUCOSE 185*   < > 229* 223* 214* 239* 194*  BUN 19   < > 25* 27* 31* 34* 39*  CREATININE 1.96*   < > 3.38* 3.69* 3.86* 3.84* 3.94*  CALCIUM 9.0   < > 8.5* 8.4* 8.2* 8.1* 8.1*  MG 2.0  --   --   --   --   --   --    < > = values in this interval not displayed.    GFR: Estimated Creatinine Clearance: 22.5 mL/min (A) (by C-G formula based on SCr of 3.94 mg/dL (H)).  Liver Function Tests: Recent Labs  Lab 11/04/19 0037  AST 72*  ALT 59*  ALKPHOS 36*  BILITOT 0.8  PROT 7.0  ALBUMIN 3.2*    CBG: Recent Labs  Lab 11/03/19 1453 11/03/19 2014 11/04/19 0752 11/04/19 1125 11/04/19 1553  GLUCAP 104* 192* 201* 219* 181*     Recent Results (from the past 240 hour(s))  SARS Coronavirus 2 by RT PCR (hospital order, performed in [facility] hospital lab) Nasopharyngeal Nasopharyngeal Swab     Status: None   Collection Time: 10/12/2019  5:38 AM   Specimen: Nasopharyngeal Swab  Result Value Ref Range Status   SARS Coronavirus 2 NEGATIVE NEGATIVE Final    Comment: (NOTE) SARS-CoV-2 target nucleic acids are NOT DETECTED. The SARS-CoV-2 RNA is generally detectable in upper and lower respiratory specimens during the acute phase of infection. The lowest concentration of SARS-CoV-2 viral copies this assay can detect is 250 copies / mL. A negative result does not preclude SARS-CoV-2 infection and should not be used as the sole basis for treatment or other patient management decisions.  A negative result may occur with improper specimen collection / handling, submission of specimen other than  nasopharyngeal swab, presence of viral mutation(s) within the areas targeted by this assay, and inadequate number of viral copies (<250 copies / mL). A negative result must be combined with clinical observations, patient history, and epidemiological information. Fact Sheet for Patients:   StrictlyIdeas.no Fact Sheet for Healthcare Providers: BankingDealers.co.za This test is not yet approved or cleared  by the [place] FDA and has been authorized for detection and/or diagnosis of SARS-CoV-2 by FDA under an Emergency Use Authorization (EUA).  This EUA will remain in effect (meaning this test can be used) for the duration of the COVID-19 declaration under Section 564(b)(1) of the Act, 21 U.S.C. section 360bbb-3(b)(1), unless the authorization is terminated or revoked sooner. Performed at [facility], [place] [address], [place], [place] [address]   Culture, blood (single)     Status: None (Preliminary result)   Collection Time: 11/04/19  3:43 AM   Specimen: BLOOD  Result Value Ref Range Status   Specimen Description BLOOD RIGHT HAND  Final   Special Requests   Final    BOTTLES DRAWN AEROBIC AND ANAEROBIC Blood Culture adequate volume   Culture   Final    NO GROWTH < 12 HOURS Performed at [facility], [place] [address], [place], [place] [address]    Report Status PENDING  Incomplete  Culture, blood (routine x 2)     Status: None (Preliminary result)   Collection Time: 11/04/19  3:54 AM   Specimen: BLOOD  Result Value Ref Range Status   Specimen Description BLOOD LEFT HAND  Final   Special Requests   Final    BOTTLES DRAWN AEROBIC ONLY Blood Culture results may  not be optimal due to an inadequate volume of blood received in culture bottles   Culture   Final    NO GROWTH < 12 HOURS Performed at [facility] [address], [place], [place] [address]    Report Status PENDING  Incomplete         Radiology  Studies: CT ABDOMEN PELVIS WO CONTRAST  Result Date: 11/04/2019 CLINICAL DATA:  Lower abdominal pain with suprapubic pain. Fever. Peritonitis or perforation suspected EXAM: CT ABDOMEN AND PELVIS WITHOUT CONTRAST TECHNIQUE: Multidetector CT imaging of the abdomen and pelvis was performed following the standard protocol without IV contrast. COMPARISON:  10/13/2018 FINDINGS: Lower chest: Small pleural effusions with lower lobe atelectasis, increased from before. There is some high-density dependent component on the right which may have been present on prior and reflect scarring. No history to implicate pleural hemorrhage. Coronary atherosclerosis. Hepatobiliary: No focal liver abnormality.No evidence of biliary obstruction or stone. Pancreas: Generalized atrophy he. Mild fat stranding around the midline pancreas. Spleen: Unremarkable. Adrenals/Urinary Tract: Negative adrenals. Right nephrectomy. No left hydronephrosis. Unremarkable bladder. Stomach/Bowel: Proximal and distal gastric into luminal clips from recent polypectomy. No pneumoperitoneum or visible bowel wall thickening, including at the duodenum or splenic flexure. Presumed medial duodenal bulb diverticulum. No bowel obstruction. Vascular/Lymphatic: Diffuse atherosclerotic plaque. Fusiform infrarenal abdominal aortic aneurysm evaluated by comparison CTA. No mass or adenopathy. Reproductive:No pathologic findings. Other: There is fluid density and stranding in the right infra hepatic space or posterior pararenal space from uncertain source. Trace pelvic peritoneal fluid. No pneumoperitoneum. Musculoskeletal: No acute finding. Lumbar spine degeneration with severe L4-5 spinal stenosis. IMPRESSION: 1. Right infra hepatic/posterior pararenal fat stranding without definite source. No extraluminal gas or bowel wall thickening to implicate recent endoscopy. Some stranding seen around the midline pancreas, please correlate with serum enzymes. 2. Small pleural  effusions and atelectasis that have increased from admission chest CT. Electronically Signed   By: Monte Fantasia M.D.   On: 11/04/2019 05:04   DG Abd 1 View  Result Date: 11/03/2019 CLINICAL DATA:  Abdominal pain EXAM: ABDOMEN - 1 VIEW COMPARISON:  02/11/2011 FINDINGS: Two supine frontal views of the abdomen and pelvis are obtained, limited by portable AP technique as well as body habitus. No bowel obstruction or ileus. No masses or abnormal calcifications. No acute bony abnormalities. IMPRESSION: 1. Unremarkable bowel gas pattern. Electronically Signed   By: Randa Ngo M.D.   On: 11/03/2019 23:04   CT HEAD WO CONTRAST  Result Date: 11/03/2019 CLINICAL DATA:  Weakness aphasia EXAM: CT HEAD WITHOUT CONTRAST TECHNIQUE: Contiguous axial images were obtained from the base of the skull through the vertex without intravenous contrast. COMPARISON:  CT head 08/15/2017 FINDINGS: Brain: Moderate atrophy. Negative for hydrocephalus. Patchy hypodensity in the white matter is unchanged and compatible with chronic microvascular ischemia. Negative for acute infarct, hemorrhage, mass. Vascular: Negative for hyperdense vessel. Atherosclerotic calcification in the carotid and vertebral arteries bilaterally. Skull: Negative Sinuses/Orbits: Extensive sinus surgery with mucosal edema throughout the paranasal sinuses. 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 ASPECTS [facility] Stroke Program Early CT Score) - Ganglionic level infarction (caudate, lentiform nuclei, internal capsule, insula, M1-M3 cortex): 7 - Supraganglionic infarction (M4-M6 cortex): 3 Total score (0-10 with 10 being normal): 10 IMPRESSION: 1. No acute abnormality 2. Atrophy and chronic microvascular ischemia 3. ASPECTS is 10 4. Preliminary report texted to Dr.Aroor via Shea Evans Electronically Signed   By: Franchot Gallo M.D.   On: 11/03/2019 15:34  DG CHEST PORT 1 VIEW  Result Date: 11/03/2019 CLINICAL DATA:   Hypoxia EXAM: PORTABLE CHEST 1 VIEW COMPARISON:  11/01/2019 FINDINGS: Cardiac enlargement with changes of CABG. Mild progression of vascular congestion and interstitial edema. Small right pleural effusion. Mild right lower lobe atelectasis IMPRESSION: Congestive heart failure with mild interval progression. Electronically Signed   By: Franchot Gallo M.D.   On: 11/03/2019 16:17        Scheduled Meds: . sodium chloride   Intravenous Once  . atorvastatin  80 mg Oral Daily  . [START ON 11/01/2019] buPROPion  150 mg Oral Daily  . Chlorhexidine Gluconate Cloth  6 each Topical Daily  . clopidogrel  75 mg Oral Daily  . dronedarone  400 mg Oral BID WC  . ezetimibe  5 mg Oral QHS  . ferrous sulfate  325 mg Oral Q M,W,F  . finasteride  5 mg Oral QHS  . fluticasone  2 spray Each Nare Daily  . insulin aspart  0-15 Units Subcutaneous TID WC  . insulin aspart  0-5 Units Subcutaneous QHS  . insulin detemir  25 Units Subcutaneous BID  . levothyroxine  150 mcg Oral QAC breakfast  . melatonin  3 mg Oral QHS  . metoprolol tartrate  50 mg Oral TID  . midodrine  10 mg Oral TID WC  . nortriptyline  50 mg Oral QHS  . pantoprazole  40 mg Oral BID AC  . senna  2 tablet Oral q AM   And  . senna  1 tablet Oral QHS  . sertraline  25 mg Oral BH-q7a  . sodium chloride flush  3 mL Intravenous Q12H  . sodium zirconium cyclosilicate  10 g Oral TID  . tamsulosin  0.4 mg Oral Daily  . traZODone  100 mg Oral QHS   Continuous Infusions: . cefTRIAXone (ROCEPHIN)  IV 1 g (11/04/19 0347)  . nitroGLYCERIN Stopped (11/03/19 1438)     LOS: 6 days    Time spent: 40 minutes    Irine Seal, MD [facility]   To contact the attending provider between 7A-7P or the covering provider during after hours 7P-7A, please log into the web site [email or web address] and access using universal [place] password for that web site. If you do not have the password, please call the hospital operator.  11/04/2019, 6:15 PM

## 2019-11-04 NOTE — Progress Notes (Signed)
CRITICAL VALUE ALERT  Critical Value:  Lactic Acid 3.6  Date & Time Notied:  11/04/1999 0130  Provider Notified: M. Sharlet Salina   Orders Received/Actions taken:

## 2019-11-05 ENCOUNTER — Encounter (HOSPITAL_COMMUNITY): Admission: EM | Disposition: E | Payer: Self-pay | Source: Home / Self Care | Attending: Internal Medicine

## 2019-11-05 ENCOUNTER — Inpatient Hospital Stay (HOSPITAL_COMMUNITY): Payer: 59

## 2019-11-05 LAB — MAGNESIUM: Magnesium: 2.6 mg/dL — ABNORMAL HIGH (ref 1.7–2.4)

## 2019-11-05 LAB — BLOOD CULTURE ID PANEL (REFLEXED)

## 2019-11-05 LAB — RENAL FUNCTION PANEL
Albumin: 3 g/dL — ABNORMAL LOW (ref 3.5–5.0)
Anion gap: 13 (ref 5–15)
BUN: 43 mg/dL — ABNORMAL HIGH (ref 8–23)
CO2: 24 mmol/L (ref 22–32)
Calcium: 8.2 mg/dL — ABNORMAL LOW (ref 8.9–10.3)
Chloride: 98 mmol/L (ref 98–111)
Creatinine, Ser: 3.57 mg/dL — ABNORMAL HIGH (ref 0.61–1.24)
GFR calc Af Amer: 18 mL/min — ABNORMAL LOW (ref >60)
GFR calc non Af Amer: 16 mL/min — ABNORMAL LOW (ref >60)
Glucose, Bld: 101 mg/dL — ABNORMAL HIGH (ref 70–99)
Phosphorus: 5.1 mg/dL — ABNORMAL HIGH (ref 2.5–4.6)
Potassium: 3.4 mmol/L — ABNORMAL LOW (ref 3.5–5.1)
Sodium: 135 mmol/L (ref 135–145)

## 2019-11-05 LAB — CBC WITH DIFFERENTIAL/PLATELET
Abs Immature Granulocytes: 0.08 10*3/uL — ABNORMAL HIGH (ref 0.00–0.07)
Basophils Absolute: 0 10*3/uL (ref 0.0–0.1)
Basophils Relative: 0 %
Eosinophils Absolute: 0 10*3/uL (ref 0.0–0.5)
Eosinophils Relative: 0 %
HCT: 28.4 % — ABNORMAL LOW (ref 39.0–52.0)
Hemoglobin: 8.6 g/dL — ABNORMAL LOW (ref 13.0–17.0)
Immature Granulocytes: 1 %
Lymphocytes Relative: 18 %
Lymphs Abs: 1.8 10*3/uL (ref 0.7–4.0)
MCH: 29.2 pg (ref 26.0–34.0)
MCHC: 30.3 g/dL (ref 30.0–36.0)
MCV: 96.3 fL (ref 80.0–100.0)
Monocytes Absolute: 0.8 10*3/uL (ref 0.1–1.0)
Monocytes Relative: 8 %
Neutro Abs: 7.4 10*3/uL (ref 1.7–7.7)
Neutrophils Relative %: 73 %
Platelets: 263 10*3/uL (ref 150–400)
RBC: 2.95 MIL/uL — ABNORMAL LOW (ref 4.22–5.81)
RDW: 16.6 % — ABNORMAL HIGH (ref 11.5–15.5)
WBC: 10.1 10*3/uL (ref 4.0–10.5)
nRBC: 0.3 % — ABNORMAL HIGH (ref 0.0–0.2)

## 2019-11-05 LAB — URINE CULTURE
Culture: NO GROWTH
Special Requests: NORMAL

## 2019-11-05 LAB — LIPASE, BLOOD: Lipase: 14 U/L (ref 11–51)

## 2019-11-05 LAB — GLUCOSE, CAPILLARY
Glucose-Capillary: 500 mg/dL — ABNORMAL HIGH (ref 70–99)
Glucose-Capillary: 84 mg/dL (ref 70–99)
Glucose-Capillary: 266 mg/dL — ABNORMAL HIGH (ref 70–99)
Glucose-Capillary: 133 mg/dL — ABNORMAL HIGH (ref 70–99)
Glucose-Capillary: 216 mg/dL — ABNORMAL HIGH (ref 70–99)

## 2019-11-05 LAB — TROPONIN I (HIGH SENSITIVITY)
Troponin I (High Sensitivity): 2376 ng/L (ref <18)
Troponin I (High Sensitivity): 2472 ng/L (ref <18)

## 2019-11-05 LAB — LACTIC ACID, PLASMA: Lactic Acid, Venous: 2 mmol/L (ref 0.5–1.9)

## 2019-11-05 SURGERY — LEFT HEART CATH AND CORS/GRAFTS ANGIOGRAPHY
Anesthesia: LOCAL

## 2019-11-05 MED ORDER — ALUM & MAG HYDROXIDE-SIMETH 200-200-20 MG/5ML PO SUSP
30.0000 mL | Freq: Four times a day (QID) | ORAL | Status: DC | PRN
  Filled 2019-11-05: qty 30

## 2019-11-05 MED ORDER — HYDROMORPHONE HCL 1 MG/ML IJ SOLN
0.5000 mg | INTRAMUSCULAR | Status: DC | PRN
  Administered 2019-11-05 (×2): 0.5 mg via INTRAVENOUS
  Filled 2019-11-05 (×2): qty 1

## 2019-11-05 MED ORDER — HYDROMORPHONE HCL 1 MG/ML IJ SOLN
0.5000 mg | INTRAMUSCULAR | Status: AC | PRN
  Administered 2019-11-05: 0.5 mg via INTRAVENOUS
  Filled 2019-11-05: qty 1

## 2019-11-05 MED ORDER — POTASSIUM CHLORIDE CRYS ER 20 MEQ PO TBCR
20.0000 meq | EXTENDED_RELEASE_TABLET | Freq: Once | ORAL | Status: AC
  Administered 2019-11-05: 20 meq via ORAL
  Filled 2019-11-05: qty 1

## 2019-11-05 MED ORDER — SODIUM ZIRCONIUM CYCLOSILICATE 10 G PO PACK
10.0000 g | PACK | Freq: Two times a day (BID) | ORAL | Status: DC
  Filled 2019-11-05: qty 1

## 2019-11-05 MED ORDER — LIDOCAINE VISCOUS HCL 2 % MT SOLN
15.0000 mL | Freq: Four times a day (QID) | OROMUCOSAL | Status: DC | PRN
  Administered 2019-11-05: 15 mL via ORAL
  Filled 2019-11-05 (×2): qty 15

## 2019-11-05 NOTE — Progress Notes (Addendum)
Patient has continued to complain of ongoing chest pain and abdominal pain. Patient received protonix, GI cocktail, and simethicone this AM which he states did not relieve pain. EKG was obtained. Patient received 0.5 mg dilaudid at 1110 and 1457. Dr. Grandville Silos at bedside at this time & assessed patient. Sent page to [facility] w/ cardiology as well. Patient's wife updated via phone about interventions that had been done to treat pain since this AM.

## 2019-11-05 NOTE — Progress Notes (Signed)
Progress Note  Patient Name: Matthew [name] Date of Encounter: 10/10/2019  Primary Cardiologist: Larae Grooms, MD   Subjective   MS improved no chest pain eating breakfast   Inpatient Medications    Scheduled Meds: . sodium chloride   Intravenous Once  . atorvastatin  80 mg Oral Daily  . buPROPion  150 mg Oral Daily  . Chlorhexidine Gluconate Cloth  6 each Topical Daily  . clopidogrel  75 mg Oral Daily  . ezetimibe  5 mg Oral QHS  . ferrous sulfate  325 mg Oral Q M,W,F  . finasteride  5 mg Oral QHS  . fluticasone  2 spray Each Nare Daily  . insulin aspart  0-15 Units Subcutaneous TID WC  . insulin aspart  0-5 Units Subcutaneous QHS  . insulin detemir  25 Units Subcutaneous BID  . levothyroxine  150 mcg Oral QAC breakfast  . melatonin  3 mg Oral QHS  . metoprolol tartrate  50 mg Oral TID  . midodrine  10 mg Oral TID WC  . nortriptyline  50 mg Oral QHS  . pantoprazole  40 mg Oral BID AC  . senna  2 tablet Oral q AM   And  . senna  1 tablet Oral QHS  . sertraline  25 mg Oral BH-q7a  . sodium chloride flush  3 mL Intravenous Q12H  . sodium zirconium cyclosilicate  10 g Oral BID  . tamsulosin  0.4 mg Oral Daily  . traZODone  100 mg Oral QHS   Continuous Infusions: . cefTRIAXone (ROCEPHIN)  IV Stopped (11/01/2019 8469)  . nitroGLYCERIN Stopped (11/03/19 1438)   PRN Meds: acetaminophen, alum & mag hydroxide-simeth, lidocaine, morphine injection, nitroGLYCERIN, OLANZapine zydis, polyethylene glycol, simethicone, sodium chloride, triamcinolone cream   Vital Signs    Vitals:   10/26/2019 0521 10/26/2019 0524 11/06/2019 0527 10/22/2019 0552  BP: 103/68 104/64 96/60 113/74  Pulse:      Resp:      Temp:      TempSrc:      SpO2:      Weight:      Height:        Intake/Output Summary (Last 24 hours) at 11/08/2019 0855 Last data filed at 10/11/2019 0245 Gross per 24 hour  Intake 960 ml  Output 950 ml  Net 10 ml   Last 3 Weights 10/11/2019 11/04/2019 11/03/2019    Weight (lbs) 286 lb 2.5 oz 285 lb 4.4 oz 276 lb 11.2 oz  Weight (kg) 129.8 kg 129.4 kg 125.51 kg      Telemetry    PAF in afib with PVC this am   ECG    Atrial fibrillation nonspecific ST changes  11/01/19   Physical Exam   Affect appropriate Obese male  HEENT: normal Neck supple with no adenopathy JVP normal no bruits no thyromegaly Lungs clear with no wheezing and good diaphragmatic motion Heart:  S1/S2 no murmur, no rub, gallop or click PMI normal post sternotomy  Abdomen: benighn, BS positve, no tenderness, no AAA no bruit.  No HSM or HJR Distal pulses intact with no bruits No edema Neuro non-focal Skin warm and dry No muscular weakness   Labs    High Sensitivity Troponin:   Recent Labs  Lab 11/01/19 1205 11/04/19 0343 11/04/19 0818 11/04/19 1019 11/04/19 1633  TROPONINIHS 882* 4,560* 5,652* 5,140* 4,573*      Chemistry Recent Labs  Lab 11/04/19 0037 11/04/19 0343 11/04/19 1019 11/04/19 1633 10/31/2019 0725  NA 130*   < >  131* 131* 135  K 6.1*   < > 5.4* 5.7* 3.4*  CL 96*   < > 99 100 98  CO2 19*   < > 19* 17* 24  GLUCOSE 229*   < > 239* 194* 101*  BUN 25*   < > 34* 39* 43*  CREATININE 3.38*   < > 3.84* 3.94* 3.57*  CALCIUM 8.5*   < > 8.1* 8.1* 8.2*  PROT 7.0  --   --   --   --   ALBUMIN 3.2*  --   --   --  3.0*  AST 72*  --   --   --   --   ALT 59*  --   --   --   --   ALKPHOS 36*  --   --   --   --   BILITOT 0.8  --   --   --   --   GFRNONAA 17*   < > 14* 14* 16*  GFRAA 19*   < > 17* 16* 18*  ANIONGAP 15   < > 13 14 13    < > = values in this interval not displayed.     Hematology Recent Labs  Lab 11/04/19 0037 11/04/19 0343 10/31/2019 0725  WBC 15.5* 14.5* 10.1  RBC 3.47* 3.26* 2.95*  HGB 10.2* 9.5* 8.6*  HCT 34.2* 32.1* 28.4*  MCV 98.6 98.5 96.3  MCH 29.4 29.1 29.2  MCHC 29.8* 29.6* 30.3  RDW 15.8* 15.9* 16.6*  PLT 365 361 263     Radiology    CT ABDOMEN PELVIS WO CONTRAST  Result Date: 11/04/2019 CLINICAL DATA:  Lower  abdominal pain with suprapubic pain. Fever. Peritonitis or perforation suspected EXAM: CT ABDOMEN AND PELVIS WITHOUT CONTRAST TECHNIQUE: Multidetector CT imaging of the abdomen and pelvis was performed following the standard protocol without IV contrast. COMPARISON:  10/13/2018 FINDINGS: Lower chest: Small pleural effusions with lower lobe atelectasis, increased from before. There is some high-density dependent component on the right which may have been present on prior and reflect scarring. No history to implicate pleural hemorrhage. Coronary atherosclerosis. Hepatobiliary: No focal liver abnormality.No evidence of biliary obstruction or stone. Pancreas: Generalized atrophy he. Mild fat stranding around the midline pancreas. Spleen: Unremarkable. Adrenals/Urinary Tract: Negative adrenals. Right nephrectomy. No left hydronephrosis. Unremarkable bladder. Stomach/Bowel: Proximal and distal gastric into luminal clips from recent polypectomy. No pneumoperitoneum or visible bowel wall thickening, including at the duodenum or splenic flexure. Presumed medial duodenal bulb diverticulum. No bowel obstruction. Vascular/Lymphatic: Diffuse atherosclerotic plaque. Fusiform infrarenal abdominal aortic aneurysm evaluated by comparison CTA. No mass or adenopathy. Reproductive:No pathologic findings. Other: There is fluid density and stranding in the right infra hepatic space or posterior pararenal space from uncertain source. Trace pelvic peritoneal fluid. No pneumoperitoneum. Musculoskeletal: No acute finding. Lumbar spine degeneration with severe L4-5 spinal stenosis. IMPRESSION: 1. Right infra hepatic/posterior pararenal fat stranding without definite source. No extraluminal gas or bowel wall thickening to implicate recent endoscopy. Some stranding seen around the midline pancreas, please correlate with serum enzymes. 2. Small pleural effusions and atelectasis that have increased from admission chest CT. Electronically Signed    By: Monte Fantasia M.D.   On: 11/04/2019 05:04   DG Abd 1 View  Result Date: 11/03/2019 CLINICAL DATA:  Abdominal pain EXAM: ABDOMEN - 1 VIEW COMPARISON:  02/11/2011 FINDINGS: Two supine frontal views of the abdomen and pelvis are obtained, limited by portable AP technique as well as body habitus. No bowel obstruction or ileus.  No masses or abnormal calcifications. No acute bony abnormalities. IMPRESSION: 1. Unremarkable bowel gas pattern. Electronically Signed   By: Randa Ngo M.D.   On: 11/03/2019 23:04   CT HEAD WO CONTRAST  Result Date: 11/03/2019 CLINICAL DATA:  Weakness aphasia EXAM: CT HEAD WITHOUT CONTRAST TECHNIQUE: Contiguous axial images were obtained from the base of the skull through the vertex without intravenous contrast. COMPARISON:  CT head 08/15/2017 FINDINGS: Brain: Moderate atrophy. Negative for hydrocephalus. Patchy hypodensity in the white matter is unchanged and compatible with chronic microvascular ischemia. Negative for acute infarct, hemorrhage, mass. Vascular: Negative for hyperdense vessel. Atherosclerotic calcification in the carotid and vertebral arteries bilaterally. Skull: Negative Sinuses/Orbits: Extensive sinus surgery with mucosal edema throughout the paranasal sinuses. 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 ASPECTS [facility] Stroke Program Early CT Score) - Ganglionic level infarction (caudate, lentiform nuclei, internal capsule, insula, M1-M3 cortex): 7 - Supraganglionic infarction (M4-M6 cortex): 3 Total score (0-10 with 10 being normal): 10 IMPRESSION: 1. No acute abnormality 2. Atrophy and chronic microvascular ischemia 3. ASPECTS is 10 4. Preliminary report texted to Dr.Aroor via Shea Evans Electronically Signed   By: Franchot Gallo M.D.   On: 11/03/2019 15:34   DG CHEST PORT 1 VIEW  Result Date: 11/03/2019 CLINICAL DATA:  Hypoxia EXAM: PORTABLE CHEST 1 VIEW COMPARISON:  11/01/2019 FINDINGS: Cardiac enlargement  with changes of CABG. Mild progression of vascular congestion and interstitial edema. Small right pleural effusion. Mild right lower lobe atelectasis IMPRESSION: Congestive heart failure with mild interval progression. Electronically Signed   By: Franchot Gallo M.D.   On: 11/03/2019 16:17    Cardiac Studies   Echo 10/30/19:  1. Left ventricular ejection fraction, by estimation, is 50 to 55%. The  left ventricle has low normal function. Left ventricular endocardial  border not optimally defined to evaluate regional wall motion. There is  moderate left ventricular hypertrophy.  Left ventricular diastolic parameters are indeterminate. Elevated left  ventricular end-diastolic pressure.  2. Right ventricular systolic function is mildly reduced. The right  ventricular size is moderately enlarged. There is moderately elevated  pulmonary artery systolic pressure.  3. The mitral valve is degenerative. Trivial mitral valve regurgitation.  No evidence of mitral stenosis.  4. The aortic valve is abnormal. Aortic valve regurgitation is not  visualized. Aortic valve mean gradient measures 7.0 mmHg. Aortic valve has  moderately reduced cusp mobility, suggesting gradient may be  underestimated, with at least mild aortic valve  stenosis.  5. The inferior vena cava is normal in size with greater than 50%  respiratory variability, suggesting right atrial pressure of 3 mmHg.    Cardiac cath 10/15/18   Prox RCA lesion is 100% stenosed.  SVG graft was visualized by angiography and is normal in caliber.  Prox Cx to Mid Cx lesion is 100% stenosed.  SVG graft was visualized by angiography and is normal in caliber.  Ost LAD to Prox LAD lesion is 100% stenosed.  LIMA graft was visualized by angiography and is normal in caliber.  SVG graft was not injected.  Mid Graft lesion before 2nd Mrg is 99% stenosed.  A drug-eluting stent was successfully placed using a STENT RESOLUTE [id number].  Post  intervention, there is a 0% residual stenosis.  1. 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 Chronic occlusion proximal LAD. Patent LIMA to mid LAD. Chronic occlusion of the vein graft to the Diagonal branch (not injected-known occlusion) 3. Chronic occlusion mid  Circumflex. Patent sequential vein graft to OM2/OM3. Severe stenosis mid body of SVG to OM2/OM3 within the old stent 4. Successful PTCA/DES x 1 mid body of SVG to OM2/OM3 5. Chronic occlusion proximal RCA. Patent SVG to distal RCA  Recommendations: ASA and Plavix along with Eliquis for one month. Would stop ASA at one month and continue Plavix along with Eliquis for at least one year. Continue statin and beta blocker. Resume Eliquis tomorrow.    Patient Profile     74 y.o. male with a hx of coronary artery disease, CABG, hypertension, hyperlipidemia, paroxysmal atrial fibrillation, hypothyroidism, CKD who presented 10/25/2019 with chest pain and was found to have a GI bleedand  elevated troponins, elevated Cr and acute anemia with drop in Hgb from 16 to 8.  Heme+ stools. Eliquis and Plavix held.   Assessment & Plan    1.  Non-STEMI/CAD: Troponin's stayed elevated his pains have not been cardiac sounding He is not a candidate for cath due to MS changes, acute GI bleed and now renal failure with solitary kidney. Cath cancelled today . Plavix restarted yesterday At this point would Rx medically and consider risk stratification as outpatient with myovue after he is d/c and other medical issues more stable   2.  Acute GI bleed: multiple stomach polyps one actively bleeding cauterized  Hct   28.4 this am  Protonix   3.  Paroxysmal atrial fibrillation: Anticoagulation on hold. Multaq d/c yesterday due to advancing renal failure Interestingly he had some NSR yesterday. Consider restarting eliquis next week   4.  Hypotension:  Chronic better with higher dose midodrine   5.  Hyperlipidemia: On rosuvastatin.  6.  Acute  kidney injury: Creatinine 3.57? From hypotension and ATN K down to 3.7 now Nephrology seeing High risk  Of dialysis if cathed/contrast given solitary kidney    For questions or updates, please contact [place] Please consult [email or web address] for contact info under        Signed, Jenkins Rouge, MD  10/24/2019, 8:55 AM

## 2019-11-05 NOTE — Progress Notes (Signed)
PHARMACY - PHYSICIAN COMMUNICATION CRITICAL VALUE ALERT - BLOOD CULTURE IDENTIFICATION (BCID)  Matthew [name] is an 74 y.o. male who presented to [facility] on 10/11/2019 with a chief complaint of CP.  Assessment: Pt started on empiric Rocephin for acute metabolic encephalopathy; now w/ 1/3 bottles growing coag-neg staph.  Name of physician (or Provider) Contacted: DThompson via text page  Current antibiotics: Rocephin  Changes to prescribed antibiotics recommended:  No changes needed.  Results for orders placed or performed during the hospital encounter of 10/26/2019  Blood Culture ID Panel (Reflexed) (Collected: 11/04/2019  3:54 AM)  Result Value Ref Range   Enterococcus species NOT DETECTED NOT DETECTED   Listeria monocytogenes NOT DETECTED NOT DETECTED   Staphylococcus species DETECTED (A) NOT DETECTED   Staphylococcus aureus (BCID) NOT DETECTED NOT DETECTED   Methicillin resistance NOT DETECTED NOT DETECTED   Streptococcus species NOT DETECTED NOT DETECTED   Streptococcus agalactiae NOT DETECTED NOT DETECTED   Streptococcus pneumoniae NOT DETECTED NOT DETECTED   Streptococcus pyogenes NOT DETECTED NOT DETECTED   Acinetobacter baumannii NOT DETECTED NOT DETECTED   Enterobacteriaceae species NOT DETECTED NOT DETECTED   Enterobacter cloacae complex NOT DETECTED NOT DETECTED   Escherichia coli NOT DETECTED NOT DETECTED   Klebsiella oxytoca NOT DETECTED NOT DETECTED   Klebsiella pneumoniae NOT DETECTED NOT DETECTED   Proteus species NOT DETECTED NOT DETECTED   Serratia marcescens NOT DETECTED NOT DETECTED   Haemophilus influenzae NOT DETECTED NOT DETECTED   Neisseria meningitidis NOT DETECTED NOT DETECTED   Pseudomonas aeruginosa NOT DETECTED NOT DETECTED   Candida albicans NOT DETECTED NOT DETECTED   Candida glabrata NOT DETECTED NOT DETECTED   Candida krusei NOT DETECTED NOT DETECTED   Candida parapsilosis NOT DETECTED NOT DETECTED   Candida tropicalis NOT DETECTED NOT  DETECTED    Wynona Neat, PharmD, BCPS  11/04/2019  6:53 AM

## 2019-11-05 NOTE — Progress Notes (Signed)
Patient c/o 8/10 chest pain. BP 106/64, two nitroglycerin given with no relief. BP 96/60 after second dose. EKG obtained.   MD notified. Dilaudid ordered and given.   Elesa Hacker, RN

## 2019-11-05 NOTE — Progress Notes (Addendum)
   Paged about ongoing abdominal and chest pain. Unfortunately patient is not a cath candidate given his renal function and acute anemia. Up-titration of antianginal also limited by orthostatic hypotension requiring midodrine. Discussed with Dr. Johnsie Cancel who recommended continued management of GI symptoms per primary team. Primary team has ordered PRN Dilaudid and GI cocktail. Repeat troponin was also ordered. No further recommendations at this time.  Darreld Mclean, PA-C 10/25/2019 4:33 PM

## 2019-11-05 NOTE — Progress Notes (Signed)
Patient c/o chest pain after eating breakfast, was noted to be belching quite frequently. PRN reflux/indigestion meds administered by primary RN stephanie. EKG done as scheduled. No acute changes noted by primary RN. Dr. Grandville Silos on the floor and made aware. Will assess patient and make recommendations if necessary. Primary RN will continue to monitor closely.

## 2019-11-05 NOTE — Progress Notes (Addendum)
[facility] Progress Note   Subjective:   Having CP post breakfast, thought to be indigestion.  Overall feels better, no new symptoms other than CP.  UOP 567m yesterday.   Objective Vitals:   10/20/2019 0527 11/09/2019 0552 11/01/2019 0900 10/23/2019 0942  BP: 96/60 113/74 115/80 113/86  Pulse:   80 98  Resp:   (!) 21   Temp:   97.9 F (36.6 C)   TempSrc:   Oral   SpO2:      Weight:      Height:       Physical Exam General: fully alert sitting up Heart: RRR Lungs: normal WOB, clear Abdomen: soft, obese Extremities: no edema GU: foley draining clear yellow urine  Additional Objective Labs: Basic Metabolic Panel: Recent Labs  Lab 11/04/19 1019 11/04/19 1633 10/15/2019 0725  NA 131* 131* 135  K 5.4* 5.7* 3.4*  CL 99 100 98  CO2 19* 17* 24  GLUCOSE 239* 194* 101*  BUN 34* 39* 43*  CREATININE 3.84* 3.94* 3.57*  CALCIUM 8.1* 8.1* 8.2*  PHOS  --   --  5.1*   Liver Function Tests: Recent Labs  Lab 11/04/19 0037 10/23/2019 0725  AST 72*  --   ALT 59*  --   ALKPHOS 36*  --   BILITOT 0.8  --   PROT 7.0  --   ALBUMIN 3.2* 3.0*   No results for input(s): LIPASE, AMYLASE in the last 168 hours. CBC: Recent Labs  Lab 11/03/19 1430 11/03/19 1430 11/03/19 1939 11/03/19 1939 11/04/19 0037 11/04/19 0343 10/22/2019 0725  WBC 12.5*   < > 12.7*   < > 15.5* 14.5* 10.1  NEUTROABS 9.0*   < >  --   --  12.3* 12.0* 7.4  HGB 8.8*   < > 9.7*   < > 10.2* 9.5* 8.6*  HCT 29.6*   < > 32.0*   < > 34.2* 32.1* 28.4*  MCV 97.4  --  98.2  --  98.6 98.5 96.3  PLT 303   < > 316   < > 365 361 263   < > = values in this interval not displayed.   Blood Culture    Component Value Date/Time   SDES URINE, CATHETERIZED 11/04/2019 0832   SPECREQUEST Normal 11/04/2019 0832   CULT  11/04/2019 0832    NO GROWTH [facility] [address], [place] [address]   REPTSTATUS 11/01/2019 FINAL 11/04/2019 03007   Cardiac Enzymes: No results for input(s):  CKTOTAL, CKMB, CKMBINDEX, TROPONINI in the last 168 hours. CBG: Recent Labs  Lab 11/04/19 1125 11/04/19 1553 11/04/19 2200 10/25/2019 0823 11/01/2019 0833  GLUCAP 219* 181* 202* 500* 84   Iron Studies: No results for input(s): IRON, TIBC, TRANSFERRIN, FERRITIN in the last 72 hours. @lablastinr3 @ Studies/Results: CT ABDOMEN PELVIS WO CONTRAST  Result Date: 11/04/2019 CLINICAL DATA:  Lower abdominal pain with suprapubic pain. Fever. Peritonitis or perforation suspected EXAM: CT ABDOMEN AND PELVIS WITHOUT CONTRAST TECHNIQUE: Multidetector CT imaging of the abdomen and pelvis was performed following the standard protocol without IV contrast. COMPARISON:  10/13/2018 FINDINGS: Lower chest: Small pleural effusions with lower lobe atelectasis, increased from before. There is some high-density dependent component on the right which may have been present on prior and reflect scarring. No history to implicate pleural hemorrhage. Coronary atherosclerosis. Hepatobiliary: No focal liver abnormality.No evidence of biliary obstruction or stone. Pancreas: Generalized atrophy he. Mild fat stranding around the midline pancreas. Spleen: Unremarkable.  Adrenals/Urinary Tract: Negative adrenals. Right nephrectomy. No left hydronephrosis. Unremarkable bladder. Stomach/Bowel: Proximal and distal gastric into luminal clips from recent polypectomy. No pneumoperitoneum or visible bowel wall thickening, including at the duodenum or splenic flexure. Presumed medial duodenal bulb diverticulum. No bowel obstruction. Vascular/Lymphatic: Diffuse atherosclerotic plaque. Fusiform infrarenal abdominal aortic aneurysm evaluated by comparison CTA. No mass or adenopathy. Reproductive:No pathologic findings. Other: There is fluid density and stranding in the right infra hepatic space or posterior pararenal space from uncertain source. Trace pelvic peritoneal fluid. No pneumoperitoneum. Musculoskeletal: No acute finding. Lumbar spine  degeneration with severe L4-5 spinal stenosis. IMPRESSION: 1. Right infra hepatic/posterior pararenal fat stranding without definite source. No extraluminal gas or bowel wall thickening to implicate recent endoscopy. Some stranding seen around the midline pancreas, please correlate with serum enzymes. 2. Small pleural effusions and atelectasis that have increased from admission chest CT. Electronically Signed   By: Monte Fantasia M.D.   On: 11/04/2019 05:04   DG Abd 1 View  Result Date: 11/03/2019 CLINICAL DATA:  Abdominal pain EXAM: ABDOMEN - 1 VIEW COMPARISON:  02/11/2011 FINDINGS: Two supine frontal views of the abdomen and pelvis are obtained, limited by portable AP technique as well as body habitus. No bowel obstruction or ileus. No masses or abnormal calcifications. No acute bony abnormalities. IMPRESSION: 1. Unremarkable bowel gas pattern. Electronically Signed   By: Randa Ngo M.D.   On: 11/03/2019 23:04   CT HEAD WO CONTRAST  Result Date: 11/03/2019 CLINICAL DATA:  Weakness aphasia EXAM: CT HEAD WITHOUT CONTRAST TECHNIQUE: Contiguous axial images were obtained from the base of the skull through the vertex without intravenous contrast. COMPARISON:  CT head 08/15/2017 FINDINGS: Brain: Moderate atrophy. Negative for hydrocephalus. Patchy hypodensity in the white matter is unchanged and compatible with chronic microvascular ischemia. Negative for acute infarct, hemorrhage, mass. Vascular: Negative for hyperdense vessel. Atherosclerotic calcification in the carotid and vertebral arteries bilaterally. Skull: Negative Sinuses/Orbits: Extensive sinus surgery with mucosal edema throughout the paranasal sinuses. 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 ASPECTS [facility] Stroke Program Early CT Score) - Ganglionic level infarction (caudate, lentiform nuclei, internal capsule, insula, M1-M3 cortex): 7 - Supraganglionic infarction (M4-M6 cortex): 3 Total score  (0-10 with 10 being normal): 10 IMPRESSION: 1. No acute abnormality 2. Atrophy and chronic microvascular ischemia 3. ASPECTS is 10 4. Preliminary report texted to Dr.Aroor via Shea Evans Electronically Signed   By: Franchot Gallo M.D.   On: 11/03/2019 15:34   DG CHEST PORT 1 VIEW  Result Date: 11/03/2019 CLINICAL DATA:  Hypoxia EXAM: PORTABLE CHEST 1 VIEW COMPARISON:  11/01/2019 FINDINGS: Cardiac enlargement with changes of CABG. Mild progression of vascular congestion and interstitial edema. Small right pleural effusion. Mild right lower lobe atelectasis IMPRESSION: Congestive heart failure with mild interval progression. Electronically Signed   By: Franchot Gallo M.D.   On: 11/03/2019 16:17   Medications: . cefTRIAXone (ROCEPHIN)  IV Stopped (11/04/2019 6269)  . nitroGLYCERIN Stopped (11/03/19 1438)   . sodium chloride   Intravenous Once  . atorvastatin  80 mg Oral Daily  . buPROPion  150 mg Oral Daily  . Chlorhexidine Gluconate Cloth  6 each Topical Daily  . clopidogrel  75 mg Oral Daily  . ezetimibe  5 mg Oral QHS  . ferrous sulfate  325 mg Oral Q M,W,F  . finasteride  5 mg Oral QHS  . fluticasone  2 spray Each Nare Daily  . insulin aspart  0-15 Units Subcutaneous TID WC  .  insulin aspart  0-5 Units Subcutaneous QHS  . insulin detemir  25 Units Subcutaneous BID  . levothyroxine  150 mcg Oral QAC breakfast  . melatonin  3 mg Oral QHS  . metoprolol tartrate  50 mg Oral TID  . midodrine  10 mg Oral TID WC  . nortriptyline  50 mg Oral QHS  . pantoprazole  40 mg Oral BID AC  . senna  2 tablet Oral q AM   And  . senna  1 tablet Oral QHS  . sertraline  25 mg Oral BH-q7a  . sodium chloride flush  3 mL Intravenous Q12H  . tamsulosin  0.4 mg Oral Daily  . traZODone  100 mg Oral QHS    Assessment/Plan **AKI on CKD 4:  Quite advanced CKD at baseline with Cr 2-2.2 now with AKI in setting of acute blood loss anemia, hypotension/shock with creatinine up to 3.9 5/26.  I suspect this is ATN from  hemodynamic insults.    No contrast or other exposure.  UA looks like UTI, culture neg. 5/26 CT A/P with unremarkable kidney and bladder, no need for renal US.   Renal function improved today with Cr 3.57.  Hopefully will continue to improve.    No indications for RRT at this time but may come up.  We discussed and he says he will consider dialysis only as last resort.    Cont supportive care to maintain renal perfusion, achieve euvolemia - would hold IVF and diuretics at the moment, avoid nephrotoxins.  Will follow closely.   **Shock: hypotensive (has baseline low BPs of note), elevated lactate. Does not appear hypovolemic. Tm 99.2, WBC mildly elevated, UA looks infected culture pending; on CTX .  TTE this admit EF 50-55%.  Midodrine has been increased and his BPs are improved, lactate trending down. Per primary.   **NSTEMI, h/o CAD with h/o PCI:  Cardiology following, holding on LHC for now in light of GIB and AKI.  No plans for LHC in light of high risk for CIN, dialysis.    **Anemia: acute blood loss s/p resection of gastric polyps.  Transfusion per primary.   **Hyperkalemia: tx with lokelma 10 and kayexelate 45g; improved.  K this AM 3.4.   **Atrial fib: per cardiology.   Jannifer Hick MD 11/04/2019, 10:01 AM  [facility] Pager: [phone]

## 2019-11-05 NOTE — Progress Notes (Addendum)
PROGRESS NOTE    Matthew [name]  GUY:[id number] DOB: 03/10/1946 DOA: 10/10/2019 PCP: Marda Stalker, PA-C    Chief Complaint  Patient presents with  . Chest Pain    Brief Narrative:  Matthew [name] a 74 y.o.malewith medical history significant ofHTN, HLD, PAF on Eliquis, CAD s/pCABGx5v and stents, AAA, hypothyroidism, CKD stage III,chronic pain,and GERD who presented with complaints of chest pain.Patient had been watching the basketball game last night and never really got to sleep with he had acute onset of left-sided chest pain. He described it as dull pain with radiation down his arm. With the chest pain he denied having any diaphoresis, nausea, vomiting, or shortness of breath. Reported having similar symptoms 3 days ago with diaphoresis and nausea, but resolved with nitroglycerin. Last night when he attempted to take a nitroglycerin he had no relief in symptoms. Denies any recent change in weight,leg swelling, abdominal pain in, or diarrhea. Other associated symptoms include chronic shortness of breath unchanged, chronic constipation for which he is on stool softeners, and reported seeing some bright red blood in his stools approximately 3 days ago. He is not on any NSAIDs, but is on Eliquis and Plavix. Patient had a left heart cath by Dr. Angelena Form in 10/2018 which showed severe triple-vessel coronary artery disease status post 5 vessel CABG with 4/5 patent grafts with successful DES to mid body of SVG to OM2/OM3. Patient had prior colonoscopy in 2015 noted 5 sessile polyps which were removed. Endoscopy from 2016 and multiple gastric polyps and Barrett's esophagus.  Patient was given 324 mg of aspirin in route with EMS  ED Course:Upon admission into the emergency department patient was seen to be afebrile with heart rates elevated up to 154, and all other vital signs maintained. Labs significant for hemoglobin 8.8, BUN 22, creatinine 2.13, glucose 243, and  high-sensitivity troponin 61 which has increased to 98 and then 4646 . Stool guaiacs were noted to be positive, but not reported to have gross blood on rectal exam. Chest x-ray showed no acute abnormalities. Patient was given Tylenol and fentanyl. TRH called to admit.  The patient has been admitted to a telemetry bed. Cardiology and gastroenterology  have been consulted. Echocardiogram pending. Plavix and Eliquis have been held for now. Evaluation for possible source of GI bleed will need to be completed before cardiology can entertain LHC which will be necessary. Gastroenterology will perform EGD and colonoscopy planned for Monday when plavix has had a chance to wash out. We will monitor Hgb and transfuse for Hgb greater than 8.0.  The patient has been having complaints of chest pain this morning. SL NTG dropped his pressure abruptly. The patient has been evaluated by cardiology. He was given IV metoprolol, SL nitro, and a GI cocktail without improvement He has been placed on nitropaste and an esmolol drip. I have given the patient morphine to address his discomfort. GI cocktail was not helpful. EKG demonstrated AF with RVR. Morphine given for pain. Cardiology has ordered transfusion as Hgb is 8.1 this morning. This correlated with an elevated troponin at 1042 which has trended down to 882. Cardidology is monitoring carefully. The patient needs LHC, but must have EGC, CSPY first. Cardiology has given the patient IV amiodarone to control rate and placed the patient on a nitro drip. They ordered CXR to evaluate the possibility of volume overload. It showed no abnormality. The patient remains on nitroglycerin gtt due to ongoing chest pain.  The afternoon of 11/03/2019 the patient had an  abrupt change in mental status.  He was found to be hypoxic and hypotensive. He was confused and dysarthric as well as agitated. ABG was performed and demonstrated only a respiratory alkalosis. BMP demonstrated worsening  renal insufficiency with increase in creatinine from 1.93 to 2.52. he was found to be hypotensive. CT head demonstrated some changes that could be consistent with infarction, but Dr Benny Lennert discussed the patient with Dr. Lorraine Lax. He saw the patient and he does not feel that the patient is having an acute infarct. The patient has been given a bolus and will be placed on continual IV fluids once bolus is completed. Diltiazem and nitro have been held.    Assessment & Plan:   Principal Problem:   Chest pain Active Problems:   Hypothyroidism   PAF (paroxysmal atrial fibrillation) (HCC)   CKD (chronic kidney disease), stage III   AAA (abdominal aortic aneurysm) (HCC)   Diabetic polyneuropathy associated with type 2 diabetes mellitus (HCC)   OSA (obstructive sleep apnea)   Prolonged QT interval   Depression   GI bleed   Acute blood loss anemia   Hyperlipidemia LDL goal <70   AKI (acute kidney injury) ([place])   Blood in stool   Stage 4 chronic kidney disease (HCC)   Acute renal failure superimposed on stage 4 chronic kidney disease ([place])   NSTEMI (non-ST elevated myocardial infarction) ([place])   Acute lower UTI  1 non-STEMI/CAD Patient had presented with chest pain that woke him up from sleep not relieved with nitroglycerin.  Chest x-ray initially on admission noted to be clear.  Patient noted to have a cardiac catheterization 10/29/2018 that showed severe triple-vessel disease with patent grafts and had a DES placed at the mid body of SVG to OM 2/OM 3.  Cardiac enzymes noted to be continuing elevated but slowly trending down.  Patient with ongoing chest pain this morning after eating his breakfast with substernal chest pain that he describes as a pressure not relieved on GI cocktail initially.  2D echo obtained with a EF of 50 to 55%, moderate LVH, LV endocardial border not optimally defined to evaluate regional wall motion, elevated left ventricular end-diastolic pressure, mildly reduced right  ventricular systolic function, right ventricular size moderately enlarged, moderately elevated pulmonary artery systolic pressure.  Trivial mitral valve regurgitation, aortic valve abnormal.  Patient on admission initially noted to be in A. fib with RVR and currently in normal sinus rhythm.  Patient being followed by cardiology and due to patient's solitary kidney with worsening renal function with creatinine up to 3.69 despite attempts with hydration doubt that patient will likely have a cardiac catheterization this week per cardiology.  Plavix started back per cardiology 48 hours post endoscopy with clipping of bleeding polyp with stable hemoglobin.  Continue statin, Multaq, Zetia, Lopressor.  Continue increased midodrine dose to 10 mg 3 times daily due to chronic hypotension.  Place on IV Dilaudid as needed chest pain.  Cardiology following and appreciate input and recommendations.  2.  Acute renal failure on chronic kidney disease stage IV and a solitary kidney Questionable etiology.  Creatinine currently at 3.57 from 3.69 from 1.96 (10/30/2019 ). Likely secondary to problem #1 with probable volume overload in the setting of chronic hypotension and poor renal perfusion.  CT abdomen and pelvis which was done was negative for hydronephrosis.  Urine sodium < 10.  Urine creatinine 214.56. Continue midodrine to 10 mg 3 times daily.  Avoid nephrotoxin agents.  Due to solitary kidney and worsening renal  function nephrology consulted and are following.  Follow.  3.  Paroxysmal atrial fibrillation Patient currently in normal sinus rhythm this morning.  Continue Multaq and Lopressor for rate control.  Anticoagulation on hold secondary to recent acute GI bleed.  Plavix resumed per cardiology.  Per cardiology.  4.  Chronic hypotension Patient with low blood pressure with systolics in the 40H to the 90s.  BP improved on increased dose of midodrine 10 mg 3 times daily.  Follow.  5.  Hyperlipidemia Statin.   6.   Upper GI bleed/acute blood loss anemia Patient noted to have a previous baseline hemoglobin of approximately 15.1 on 08/22/2019.  Patient presented with hemoglobin which dropped as low as 8.8.  FOBT positive.  Patient's Eliquis and Plavix held.  Patient seen in consultation by GI and patient underwent colonoscopy and upper endoscopy.  Upper endoscopy showed medium hiatal hernia, multiple gastric polyps 1 which was large and oozing blood and one smaller with stigmata of recent bleeding status post resection and clipping.  Gastritis also noted.  Colonoscopy showed nonbleeding hemorrhoids.  Patient also with multiple 2 to 6 mm polyps throughout the colon.  GI had recommended to restart patient's Eliquis and Plavix at the latest date acceptable to cardiology.  Status post IV Feraheme x1.  Status post 1 unit transfusion packed red blood cells 11/01/2019.  H&H stable at 8.6 today.  Monitor closely as Plavix has been resumed.  Continue PPI twice daily.  Will need outpatient follow-up with GI.  7.  Diabetes mellitus type II with polyneuropathy Hemoglobin A1c of 7.6 on 08/22/2019.  CBG of 84 this morning.  Continue current dose of Levemir, sliding scale insulin.  8.  UTI Urinalysis done cloudy, trace leukocytes, nitrite negative, 21-50 WBCs.  Patient noted to be hypotensive yesterday.  Patient noted to have a leukocytosis.  Patient pancultured.  Patient started empirically on IV Rocephin and will treat for total of 3 days..  Follow.  9.  Acute metabolic encephalopathy Questionable etiology.  Patient noted with some dysarthric speech, confusion and agitation.  Patient seen by neurology, head CT done with no acute abnormalities noted.  Patient's anticoagulation was held during the hospitalization due to upper GI bleed.  Per neurology patient will need MRI to evaluate for watershed infarcts.  Avoid hypotension.  Concerned that patient's confusion/agitation likely due to hospital delirium.  Minimize sedating medications.   Patient pancultured.  Continue empiric IV Rocephin.  Improved clinically.  Neurology following and I appreciate the input and recommendations.  10.  Hypothyroidism Continue Synthroid.  Outpatient follow-up.  11.  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  Will likely need follow-up surveillance this month or next month which may be done in the outpatient setting.  12.  Depression Continue Zoloft, Wellbutrin, nortriptyline, trazodone.  Dosages adjusted per pharmacy due to worsening renal function.  13.  History of memory loss Patient noted to recently have been started on Namenda.  14.  Hyperlipidemia Continue statin, fenofibrate, Zetia.    15.  Gastroesophageal reflux disease Continue PPI.   16.  OSA on CPAP CPAP nightly.  17.  Hyperkalemia Likely secondary to worsening renal function.  Patient status post Lokelma 3 times daily x1 day, status post Kayexalate x2 doses.  Potassium at 3.4 this morning.  Follow.     DVT prophylaxis: SCDs/recent GI bleed Code Status: Full Family Communication: Updated patient.  Tried to call wife on telephone however went to voicemail.  Disposition:  Status is: Inpatient    Dispo: The patient is from: Home              Anticipated d/c is to: To be determined              Anticipated d/c date is: To be determined              Patient currently in acute on chronic kidney disease stage IV, non-STEMI, hypotension, cardiology and nephrology consulted.  Patient still with ongoing chest pain.        Consultants:   Nephrology pending  Neurology: Dr.Aroor 11/03/2019  Cardiology: Dr.Nahser 10/23/2019  Gastroenterology: Dr. Ardis Hughs 10/26/2019  Procedures:  CT abdomen and pelvis 11/04/2019  CT head without contrast 11/03/2019  Chest x-ray 10/21/2019, 11/01/2019, 11/03/2019  Abdominal films 11/03/2019  Upper endoscopy 10/23/2019  Colonoscopy 10/11/2019  Antimicrobials:   IV Rocephin  11/04/2019     Subjective: Patient sitting up in bed.  Patient with complaints of substernal chest pain not relieved with Maalox.  Patient describes the pain as more pressure sensation.  Patient noted to have pain after eating his breakfast.  Patient with some belching.  Patient states pain feels like he is having a heart attack.  No nausea or emesis.  Patient states has a rectal tube with loose stools that was placed yesterday.  Objective: Vitals:   11/06/2019 0527 10/21/2019 0552 11/06/2019 0900 11/03/2019 0942  BP: 96/60 113/74 115/80 113/86  Pulse:   80 98  Resp:   (!) 21   Temp:   97.9 F (36.6 C)   TempSrc:   Oral   SpO2:      Weight:      Height:        Intake/Output Summary (Last 24 hours) at 10/20/2019 1014 Last data filed at 10/20/2019 0245 Gross per 24 hour  Intake 720 ml  Output 950 ml  Net -230 ml   Filed Weights   11/03/19 0338 11/04/19 0632 11/04/2019 0341  Weight: 125.5 kg 129.4 kg 129.8 kg    Examination:  General exam: NAD Respiratory system: Clear bilaterally anterior lung fields.  No wheezes, no crackles, no rhonchi.  Normal respiratory effort. Cardiovascular system: Regular rate and rhythm no murmurs rubs or gallops.  No JVD.  No lower extremity edema. Gastrointestinal system: Abdomen is soft, nontender, nondistended, positive bowel sounds.  No rebound.  No guarding. Central nervous system: Alert and oriented.  Improvement with dysarthric speech.  No focal neurological deficits. Extremities: Symmetric 5 x 5 power. Skin: No rashes, lesions or ulcers Psychiatry: Judgement and insight appear normal. Mood & affect appropriate.     Data Reviewed: I have personally reviewed following labs and imaging studies  CBC: Recent Labs  Lab 11/03/19 0401 11/03/19 0401 11/03/19 1430 11/03/19 1939 11/04/19 0037 11/04/19 0343 10/28/2019 0725  WBC 9.9   < > 12.5* 12.7* 15.5* 14.5* 10.1  NEUTROABS 7.3  --  9.0*  --  12.3* 12.0* 7.4  HGB 8.6*   < > 8.8* 9.7* 10.2* 9.5*  8.6*  HCT 28.6*   < > 29.6* 32.0* 34.2* 32.1* 28.4*  MCV 96.3   < > 97.4 98.2 98.6 98.5 96.3  PLT 265   < > 303 316 365 361 263   < > = values in this interval not displayed.    Basic Metabolic Panel: Recent Labs  Lab 10/30/19 0400 10/31/19 1030 11/04/19 0343 11/04/19 0818 11/04/19 1019 11/04/19 1633 11/09/2019 0725  NA 137   < > 133* 130* 131*  131* 135  K 4.3   < > 6.3* 5.8* 5.4* 5.7* 3.4*  CL 101   < > 96* 97* 99 100 98  CO2 27   < > 21* 19* 19* 17* 24  GLUCOSE 185*   < > 223* 214* 239* 194* 101*  BUN 19   < > 27* 31* 34* 39* 43*  CREATININE 1.96*   < > 3.69* 3.86* 3.84* 3.94* 3.57*  CALCIUM 9.0   < > 8.4* 8.2* 8.1* 8.1* 8.2*  MG 2.0  --   --   --   --   --  2.6*  PHOS  --   --   --   --   --   --  5.1*   < > = values in this interval not displayed.    GFR: Estimated Creatinine Clearance: 24.9 mL/min (A) (by C-G formula based on SCr of 3.57 mg/dL (H)).  Liver Function Tests: Recent Labs  Lab 11/04/19 0037 10/27/2019 0725  AST 72*  --   ALT 59*  --   ALKPHOS 36*  --   BILITOT 0.8  --   PROT 7.0  --   ALBUMIN 3.2* 3.0*    CBG: Recent Labs  Lab 11/04/19 1125 11/04/19 1553 11/04/19 2200 11/04/2019 0823 10/10/2019 0833  GLUCAP 219* 181* 202* 500* 84     Recent Results (from the past 240 hour(s))  SARS Coronavirus 2 by RT PCR (hospital order, performed in [facility] hospital lab) Nasopharyngeal Nasopharyngeal Swab     Status: None   Collection Time: 10/24/2019  5:38 AM   Specimen: Nasopharyngeal Swab  Result Value Ref Range Status   SARS Coronavirus 2 NEGATIVE NEGATIVE Final    Comment: (NOTE) SARS-CoV-2 target nucleic acids are NOT DETECTED. The SARS-CoV-2 RNA is generally detectable in upper and lower respiratory specimens during the acute phase of infection. The lowest concentration of SARS-CoV-2 viral copies this assay can detect is 250 copies / mL. A negative result does not preclude SARS-CoV-2 infection and should not be used as the sole basis for  treatment or other patient management decisions.  A negative result may occur with improper specimen collection / handling, submission of specimen other than nasopharyngeal swab, presence of viral mutation(s) within the areas targeted by this assay, and inadequate number of viral copies (<250 copies / mL). A negative result must be combined with clinical observations, patient history, and epidemiological information. Fact Sheet for Patients:   StrictlyIdeas.no Fact Sheet for Healthcare Providers: BankingDealers.co.za This test is not yet approved or cleared  by the [place] FDA and has been authorized for detection and/or diagnosis of SARS-CoV-2 by FDA under an Emergency Use Authorization (EUA).  This EUA will remain in effect (meaning this test can be used) for the duration of the COVID-19 declaration under Section 564(b)(1) of the Act, 21 U.S.C. section 360bbb-3(b)(1), unless the authorization is terminated or revoked sooner. Performed at [facility], [place] [address] [place], [place] [address]   Culture, blood (single)     Status: None (Preliminary result)   Collection Time: 11/04/19  3:43 AM   Specimen: BLOOD  Result Value Ref Range Status   Specimen Description BLOOD RIGHT HAND  Final   Special Requests   Final    BOTTLES DRAWN AEROBIC AND ANAEROBIC Blood Culture adequate volume   Culture   Final    NO GROWTH 1 DAY Performed at [facility], [place] [address] [place], [place] [address]    Report  Status PENDING  Incomplete  Culture, blood (routine x 2)     Status: None (Preliminary result)   Collection Time: 11/04/19  3:54 AM   Specimen: BLOOD  Result Value Ref Range Status   Specimen Description BLOOD LEFT HAND  Final   Special Requests   Final    BOTTLES DRAWN AEROBIC ONLY Blood Culture results may not be optimal due to an inadequate volume of blood received in culture bottles   Culture  Setup Time   Final     AEROBIC BOTTLE ONLY GRAM POSITIVE COCCI IN CLUSTERS Organism ID to follow CRITICAL RESULT CALLED TO, READ BACK BY AND VERIFIED WITH: V BRYK PHARMD 10/19/2019 0647 JDW    Culture   Final    NO GROWTH 1 DAY Performed at [facility], [place] [address], [place], [place] [address]    Report Status PENDING  Incomplete  Blood Culture ID Panel (Reflexed)     Status: Abnormal   Collection Time: 11/04/19  3:54 AM  Result Value Ref Range Status   Enterococcus species NOT DETECTED NOT DETECTED Final   Listeria monocytogenes NOT DETECTED NOT DETECTED Final   Staphylococcus species DETECTED (A) NOT DETECTED Final    Comment: Methicillin (oxacillin) susceptible coagulase negative staphylococcus. Possible blood culture contaminant (unless isolated from more than one blood culture draw or clinical case suggests pathogenicity). No antibiotic treatment is indicated for blood  culture contaminants. CRITICAL RESULT CALLED TO, READ BACK BY AND VERIFIED WITH: V BRYK PHARMD 11/01/2019 0647 JDW    Staphylococcus aureus (BCID) NOT DETECTED NOT DETECTED Final   Methicillin resistance NOT DETECTED NOT DETECTED Final   Streptococcus species NOT DETECTED NOT DETECTED Final   Streptococcus agalactiae NOT DETECTED NOT DETECTED Final   Streptococcus pneumoniae NOT DETECTED NOT DETECTED Final   Streptococcus pyogenes NOT DETECTED NOT DETECTED Final   Acinetobacter baumannii NOT DETECTED NOT DETECTED Final   Enterobacteriaceae species NOT DETECTED NOT DETECTED Final   Enterobacter cloacae complex NOT DETECTED NOT DETECTED Final   Escherichia coli NOT DETECTED NOT DETECTED Final   Klebsiella oxytoca NOT DETECTED NOT DETECTED Final   Klebsiella pneumoniae NOT DETECTED NOT DETECTED Final   Proteus species NOT DETECTED NOT DETECTED Final   Serratia marcescens NOT DETECTED NOT DETECTED Final   Haemophilus influenzae NOT DETECTED NOT DETECTED Final   Neisseria meningitidis NOT DETECTED NOT DETECTED Final   Pseudomonas  aeruginosa NOT DETECTED NOT DETECTED Final   Candida albicans NOT DETECTED NOT DETECTED Final   Candida glabrata NOT DETECTED NOT DETECTED Final   Candida krusei NOT DETECTED NOT DETECTED Final   Candida parapsilosis NOT DETECTED NOT DETECTED Final   Candida tropicalis NOT DETECTED NOT DETECTED Final    Comment: Performed at [facility], [place]. [address], [place], [place] [address]  Culture, Urine     Status: None   Collection Time: 11/04/19  8:32 AM   Specimen: Urine, Catheterized  Result Value Ref Range Status   Specimen Description URINE, CATHETERIZED  Final   Special Requests Normal  Final   Culture   Final    NO GROWTH Performed at [facility], [address] [place], [place] [address]    Report Status 10/31/2019 FINAL  Final         Radiology Studies: CT ABDOMEN PELVIS WO CONTRAST  Result Date: 11/04/2019 CLINICAL DATA:  Lower abdominal pain with suprapubic pain. Fever. Peritonitis or perforation suspected EXAM: CT ABDOMEN AND PELVIS WITHOUT CONTRAST TECHNIQUE: Multidetector CT imaging of  the abdomen and pelvis was performed following the standard protocol without IV contrast. COMPARISON:  10/13/2018 FINDINGS: Lower chest: Small pleural effusions with lower lobe atelectasis, increased from before. There is some high-density dependent component on the right which may have been present on prior and reflect scarring. No history to implicate pleural hemorrhage. Coronary atherosclerosis. Hepatobiliary: No focal liver abnormality.No evidence of biliary obstruction or stone. Pancreas: Generalized atrophy he. Mild fat stranding around the midline pancreas. Spleen: Unremarkable. Adrenals/Urinary Tract: Negative adrenals. Right nephrectomy. No left hydronephrosis. Unremarkable bladder. Stomach/Bowel: Proximal and distal gastric into luminal clips from recent polypectomy. No pneumoperitoneum or visible bowel wall thickening, including at the duodenum or splenic flexure. Presumed  medial duodenal bulb diverticulum. No bowel obstruction. Vascular/Lymphatic: Diffuse atherosclerotic plaque. Fusiform infrarenal abdominal aortic aneurysm evaluated by comparison CTA. No mass or adenopathy. Reproductive:No pathologic findings. Other: There is fluid density and stranding in the right infra hepatic space or posterior pararenal space from uncertain source. Trace pelvic peritoneal fluid. No pneumoperitoneum. Musculoskeletal: No acute finding. Lumbar spine degeneration with severe L4-5 spinal stenosis. IMPRESSION: 1. Right infra hepatic/posterior pararenal fat stranding without definite source. No extraluminal gas or bowel wall thickening to implicate recent endoscopy. Some stranding seen around the midline pancreas, please correlate with serum enzymes. 2. Small pleural effusions and atelectasis that have increased from admission chest CT. Electronically Signed   By: Monte Fantasia M.D.   On: 11/04/2019 05:04   DG Abd 1 View  Result Date: 11/03/2019 CLINICAL DATA:  Abdominal pain EXAM: ABDOMEN - 1 VIEW COMPARISON:  02/11/2011 FINDINGS: Two supine frontal views of the abdomen and pelvis are obtained, limited by portable AP technique as well as body habitus. No bowel obstruction or ileus. No masses or abnormal calcifications. No acute bony abnormalities. IMPRESSION: 1. Unremarkable bowel gas pattern. Electronically Signed   By: Randa Ngo M.D.   On: 11/03/2019 23:04   CT HEAD WO CONTRAST  Result Date: 11/03/2019 CLINICAL DATA:  Weakness aphasia EXAM: CT HEAD WITHOUT CONTRAST TECHNIQUE: Contiguous axial images were obtained from the base of the skull through the vertex without intravenous contrast. COMPARISON:  CT head 08/15/2017 FINDINGS: Brain: Moderate atrophy. Negative for hydrocephalus. Patchy hypodensity in the white matter is unchanged and compatible with chronic microvascular ischemia. Negative for acute infarct, hemorrhage, mass. Vascular: Negative for hyperdense vessel.  Atherosclerotic calcification in the carotid and vertebral arteries bilaterally. Skull: Negative Sinuses/Orbits: Extensive sinus surgery with mucosal edema throughout the paranasal sinuses. 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 ASPECTS [facility] Stroke Program Early CT Score) - Ganglionic level infarction (caudate, lentiform nuclei, internal capsule, insula, M1-M3 cortex): 7 - Supraganglionic infarction (M4-M6 cortex): 3 Total score (0-10 with 10 being normal): 10 IMPRESSION: 1. No acute abnormality 2. Atrophy and chronic microvascular ischemia 3. ASPECTS is 10 4. Preliminary report texted to Dr.Aroor via Shea Evans Electronically Signed   By: Franchot Gallo M.D.   On: 11/03/2019 15:34   DG CHEST PORT 1 VIEW  Result Date: 11/03/2019 CLINICAL DATA:  Hypoxia EXAM: PORTABLE CHEST 1 VIEW COMPARISON:  11/01/2019 FINDINGS: Cardiac enlargement with changes of CABG. Mild progression of vascular congestion and interstitial edema. Small right pleural effusion. Mild right lower lobe atelectasis IMPRESSION: Congestive heart failure with mild interval progression. Electronically Signed   By: Franchot Gallo M.D.   On: 11/03/2019 16:17        Scheduled Meds: . sodium chloride   Intravenous Once  . atorvastatin  80 mg Oral Daily  .  buPROPion  150 mg Oral Daily  . Chlorhexidine Gluconate Cloth  6 each Topical Daily  . clopidogrel  75 mg Oral Daily  . ezetimibe  5 mg Oral QHS  . ferrous sulfate  325 mg Oral Q M,W,F  . finasteride  5 mg Oral QHS  . fluticasone  2 spray Each Nare Daily  . insulin aspart  0-15 Units Subcutaneous TID WC  . insulin aspart  0-5 Units Subcutaneous QHS  . insulin detemir  25 Units Subcutaneous BID  . levothyroxine  150 mcg Oral QAC breakfast  . melatonin  3 mg Oral QHS  . metoprolol tartrate  50 mg Oral TID  . midodrine  10 mg Oral TID WC  . nortriptyline  50 mg Oral QHS  . pantoprazole  40 mg Oral BID AC  . senna  2 tablet Oral q AM    And  . senna  1 tablet Oral QHS  . sertraline  25 mg Oral BH-q7a  . sodium chloride flush  3 mL Intravenous Q12H  . tamsulosin  0.4 mg Oral Daily  . traZODone  100 mg Oral QHS   Continuous Infusions: . cefTRIAXone (ROCEPHIN)  IV Stopped (10/15/2019 3546)  . nitroGLYCERIN Stopped (11/03/19 1438)     LOS: 7 days    Time spent: 40 minutes    Irine Seal, MD [facility]   To contact the attending provider between 7A-7P or the covering provider during after hours 7P-7A, please log into the web site [email or web address] and access using universal [place] password for that web site. If you do not have the password, please call the hospital operator.  11/01/2019, 10:14 AM

## 2019-11-06 ENCOUNTER — Inpatient Hospital Stay (HOSPITAL_COMMUNITY): Payer: 59

## 2019-11-06 ENCOUNTER — Inpatient Hospital Stay (HOSPITAL_COMMUNITY): Payer: 59 | Admitting: Anesthesiology

## 2019-11-06 ENCOUNTER — Other Ambulatory Visit (HOSPITAL_COMMUNITY): Payer: 59

## 2019-11-06 DIAGNOSIS — J9601 Acute respiratory failure with hypoxia: Secondary | ICD-10-CM

## 2019-11-06 DIAGNOSIS — G9341 Metabolic encephalopathy: Secondary | ICD-10-CM

## 2019-11-06 DIAGNOSIS — N170 Acute kidney failure with tubular necrosis: Secondary | ICD-10-CM

## 2019-11-06 DIAGNOSIS — Z9911 Dependence on respirator [ventilator] status: Secondary | ICD-10-CM

## 2019-11-06 DIAGNOSIS — I4901 Ventricular fibrillation: Secondary | ICD-10-CM

## 2019-11-06 DIAGNOSIS — I200 Unstable angina: Secondary | ICD-10-CM

## 2019-11-06 DIAGNOSIS — N184 Chronic kidney disease, stage 4 (severe): Secondary | ICD-10-CM

## 2019-11-06 DIAGNOSIS — I469 Cardiac arrest, cause unspecified: Secondary | ICD-10-CM

## 2019-11-06 LAB — RENAL FUNCTION PANEL
Albumin: 3 g/dL — ABNORMAL LOW (ref 3.5–5.0)
Anion gap: 16 — ABNORMAL HIGH (ref 5–15)
BUN: 54 mg/dL — ABNORMAL HIGH (ref 8–23)
CO2: 20 mmol/L — ABNORMAL LOW (ref 22–32)
Calcium: 7.8 mg/dL — ABNORMAL LOW (ref 8.9–10.3)
Chloride: 93 mmol/L — ABNORMAL LOW (ref 98–111)
Creatinine, Ser: 3.85 mg/dL — ABNORMAL HIGH (ref 0.61–1.24)
GFR calc Af Amer: 17 mL/min — ABNORMAL LOW (ref >60)
GFR calc non Af Amer: 14 mL/min — ABNORMAL LOW (ref >60)
Glucose, Bld: 201 mg/dL — ABNORMAL HIGH (ref 70–99)
Phosphorus: 5.9 mg/dL — ABNORMAL HIGH (ref 2.5–4.6)
Potassium: 4.7 mmol/L (ref 3.5–5.1)
Sodium: 129 mmol/L — ABNORMAL LOW (ref 135–145)

## 2019-11-06 LAB — POCT I-STAT 7, (LYTES, BLD GAS, ICA,H+H)
Acid-base deficit: 1 mmol/L (ref 0.0–2.0)
Bicarbonate: 23.1 mmol/L (ref 20.0–28.0)
Calcium, Ion: 0.99 mmol/L — ABNORMAL LOW (ref 1.15–1.40)
HCT: 27 % — ABNORMAL LOW (ref 39.0–52.0)
Hemoglobin: 9.2 g/dL — ABNORMAL LOW (ref 13.0–17.0)
O2 Saturation: 76 %
Potassium: 3.4 mmol/L — ABNORMAL LOW (ref 3.5–5.1)
Sodium: 133 mmol/L — ABNORMAL LOW (ref 135–145)
TCO2: 24 mmol/L (ref 22–32)
pCO2 arterial: 35.2 mmHg (ref 32.0–48.0)
pH, Arterial: 7.425 (ref 7.350–7.450)
pO2, Arterial: 40 mmHg — CL (ref 83.0–108.0)

## 2019-11-06 LAB — BLOOD GAS, ARTERIAL
Acid-base deficit: 6.3 mmol/L — ABNORMAL HIGH (ref 0.0–2.0)
Bicarbonate: 17.7 mmol/L — ABNORMAL LOW (ref 20.0–28.0)
Drawn by: 35043
FIO2: 40
O2 Saturation: 96 %
Patient temperature: 36.6
pCO2 arterial: 29.4 mmHg — ABNORMAL LOW (ref 32.0–48.0)
pH, Arterial: 7.395 (ref 7.350–7.450)
pO2, Arterial: 96.4 mmHg (ref 83.0–108.0)

## 2019-11-06 LAB — CBC
HCT: 29.6 % — ABNORMAL LOW (ref 39.0–52.0)
Hemoglobin: 9.1 g/dL — ABNORMAL LOW (ref 13.0–17.0)
MCH: 29.6 pg (ref 26.0–34.0)
MCHC: 30.7 g/dL (ref 30.0–36.0)
MCV: 96.4 fL (ref 80.0–100.0)
Platelets: 307 10*3/uL (ref 150–400)
RBC: 3.07 MIL/uL — ABNORMAL LOW (ref 4.22–5.81)
RDW: 17.3 % — ABNORMAL HIGH (ref 11.5–15.5)
WBC: 16.4 10*3/uL — ABNORMAL HIGH (ref 4.0–10.5)
nRBC: 0.6 % — ABNORMAL HIGH (ref 0.0–0.2)

## 2019-11-06 LAB — COMPREHENSIVE METABOLIC PANEL
ALT: 1035 U/L — ABNORMAL HIGH (ref 0–44)
AST: 536 U/L — ABNORMAL HIGH (ref 15–41)
Albumin: 3 g/dL — ABNORMAL LOW (ref 3.5–5.0)
Alkaline Phosphatase: 38 U/L (ref 38–126)
Anion gap: 16 — ABNORMAL HIGH (ref 5–15)
BUN: 59 mg/dL — ABNORMAL HIGH (ref 8–23)
CO2: 19 mmol/L — ABNORMAL LOW (ref 22–32)
Calcium: 7.7 mg/dL — ABNORMAL LOW (ref 8.9–10.3)
Chloride: 95 mmol/L — ABNORMAL LOW (ref 98–111)
Creatinine, Ser: 4.16 mg/dL — ABNORMAL HIGH (ref 0.61–1.24)
GFR calc Af Amer: 15 mL/min — ABNORMAL LOW (ref >60)
GFR calc non Af Amer: 13 mL/min — ABNORMAL LOW (ref >60)
Glucose, Bld: 207 mg/dL — ABNORMAL HIGH (ref 70–99)
Potassium: 4.8 mmol/L (ref 3.5–5.1)
Sodium: 130 mmol/L — ABNORMAL LOW (ref 135–145)
Total Bilirubin: 0.6 mg/dL (ref 0.3–1.2)
Total Protein: 6.3 g/dL — ABNORMAL LOW (ref 6.5–8.1)

## 2019-11-06 LAB — CULTURE, BLOOD (ROUTINE X 2)

## 2019-11-06 LAB — CBC WITH DIFFERENTIAL/PLATELET
Abs Immature Granulocytes: 0.29 10*3/uL — ABNORMAL HIGH (ref 0.00–0.07)
Basophils Absolute: 0.1 10*3/uL (ref 0.0–0.1)
Basophils Relative: 1 %
Eosinophils Absolute: 0.1 10*3/uL (ref 0.0–0.5)
Eosinophils Relative: 1 %
HCT: 30 % — ABNORMAL LOW (ref 39.0–52.0)
Hemoglobin: 9.5 g/dL — ABNORMAL LOW (ref 13.0–17.0)
Immature Granulocytes: 2 %
Lymphocytes Relative: 10 %
Lymphs Abs: 1.6 10*3/uL (ref 0.7–4.0)
MCH: 30.8 pg (ref 26.0–34.0)
MCHC: 31.7 g/dL (ref 30.0–36.0)
MCV: 97.4 fL (ref 80.0–100.0)
Monocytes Absolute: 1.7 10*3/uL — ABNORMAL HIGH (ref 0.1–1.0)
Monocytes Relative: 10 %
Neutro Abs: 12.9 10*3/uL — ABNORMAL HIGH (ref 1.7–7.7)
Neutrophils Relative %: 76 %
Platelets: 330 10*3/uL (ref 150–400)
RBC: 3.08 MIL/uL — ABNORMAL LOW (ref 4.22–5.81)
RDW: 17.2 % — ABNORMAL HIGH (ref 11.5–15.5)
WBC: 16.8 10*3/uL — ABNORMAL HIGH (ref 4.0–10.5)
nRBC: 0.8 % — ABNORMAL HIGH (ref 0.0–0.2)

## 2019-11-06 LAB — CK TOTAL AND CKMB (NOT AT ARMC)
CK, MB: 9.7 ng/mL — ABNORMAL HIGH (ref 0.5–5.0)
Relative Index: 5.7 — ABNORMAL HIGH (ref 0.0–2.5)
Total CK: 171 U/L (ref 49–397)

## 2019-11-06 LAB — LACTIC ACID, PLASMA
Lactic Acid, Venous: 4.5 mmol/L (ref 0.5–1.9)
Lactic Acid, Venous: 4.1 mmol/L (ref 0.5–1.9)
Lactic Acid, Venous: 2.4 mmol/L (ref 0.5–1.9)
Lactic Acid, Venous: 2.5 mmol/L (ref 0.5–1.9)

## 2019-11-06 LAB — TROPONIN I (HIGH SENSITIVITY)
Troponin I (High Sensitivity): 2001 ng/L (ref <18)
Troponin I (High Sensitivity): 2391 ng/L (ref <18)
Troponin I (High Sensitivity): 4199 ng/L (ref <18)
Troponin I (High Sensitivity): 3965 ng/L (ref <18)

## 2019-11-06 LAB — PROCALCITONIN: Procalcitonin: 0.49 ng/mL

## 2019-11-06 LAB — MAGNESIUM
Magnesium: 2.8 mg/dL — ABNORMAL HIGH (ref 1.7–2.4)
Magnesium: 2.5 mg/dL — ABNORMAL HIGH (ref 1.7–2.4)

## 2019-11-06 LAB — GLUCOSE, CAPILLARY
Glucose-Capillary: 215 mg/dL — ABNORMAL HIGH (ref 70–99)
Glucose-Capillary: 165 mg/dL — ABNORMAL HIGH (ref 70–99)
Glucose-Capillary: 192 mg/dL — ABNORMAL HIGH (ref 70–99)
Glucose-Capillary: 162 mg/dL — ABNORMAL HIGH (ref 70–99)
Glucose-Capillary: 133 mg/dL — ABNORMAL HIGH (ref 70–99)
Glucose-Capillary: 73 mg/dL (ref 70–99)
Glucose-Capillary: 138 mg/dL — ABNORMAL HIGH (ref 70–99)
Glucose-Capillary: 83 mg/dL (ref 70–99)
Glucose-Capillary: 77 mg/dL (ref 70–99)

## 2019-11-06 LAB — HEPARIN LEVEL (UNFRACTIONATED): Heparin Unfractionated: 0.15 [IU]/mL — ABNORMAL LOW (ref 0.30–0.70)

## 2019-11-06 LAB — ECHO TEE
Height: 72 in
Weight: 4645.53 oz

## 2019-11-06 LAB — BRAIN NATRIURETIC PEPTIDE: B Natriuretic Peptide: 942.9 pg/mL — ABNORMAL HIGH (ref 0.0–100.0)

## 2019-11-06 MED ORDER — TRAZODONE HCL 50 MG PO TABS
100.0000 mg | ORAL_TABLET | Freq: Every day | ORAL | Status: DC
  Administered 2019-11-06 – 2019-11-10 (×5): 100 mg
  Filled 2019-11-06 (×5): qty 2

## 2019-11-06 MED ORDER — FENTANYL CITRATE (PF) 100 MCG/2ML IJ SOLN: 100.0000 ug | Freq: Once | INTRAMUSCULAR | Status: AC

## 2019-11-06 MED ORDER — LORAZEPAM 2 MG/ML IJ SOLN: 0.5000 mg | INTRAMUSCULAR | Status: DC | PRN

## 2019-11-06 MED ORDER — FENTANYL CITRATE (PF) 100 MCG/2ML IJ SOLN
25.0000 ug | INTRAMUSCULAR | Status: AC | PRN
  Administered 2019-11-07 – 2019-11-08 (×3): 25 ug via INTRAVENOUS

## 2019-11-06 MED ORDER — POLYETHYLENE GLYCOL 3350 17 G PO PACK
17.0000 g | PACK | Freq: Every day | ORAL | Status: DC
  Administered 2019-11-06 – 2019-11-09 (×4): 17 g
  Filled 2019-11-06 (×4): qty 1

## 2019-11-06 MED ORDER — FENTANYL CITRATE (PF) 100 MCG/2ML IJ SOLN
INTRAMUSCULAR | Status: AC
  Administered 2019-11-06: 100 ug
  Filled 2019-11-06: qty 2

## 2019-11-06 MED ORDER — INSULIN ASPART 100 UNIT/ML ~~LOC~~ SOLN
0.0000 [IU] | SUBCUTANEOUS | Status: DC
  Administered 2019-11-06: 3 [IU] via SUBCUTANEOUS
  Administered 2019-11-06: 2 [IU] via SUBCUTANEOUS
  Administered 2019-11-07: 3 [IU] via SUBCUTANEOUS
  Administered 2019-11-07: 2 [IU] via SUBCUTANEOUS
  Administered 2019-11-08: 3 [IU] via SUBCUTANEOUS
  Administered 2019-11-08 (×2): 5 [IU] via SUBCUTANEOUS
  Administered 2019-11-08: 2 [IU] via SUBCUTANEOUS
  Administered 2019-11-08 (×2): 3 [IU] via SUBCUTANEOUS
  Administered 2019-11-09: 8 [IU] via SUBCUTANEOUS
  Administered 2019-11-09: 3 [IU] via SUBCUTANEOUS
  Administered 2019-11-09 (×4): 8 [IU] via SUBCUTANEOUS
  Administered 2019-11-10: 5 [IU] via SUBCUTANEOUS
  Administered 2019-11-10: 8 [IU] via SUBCUTANEOUS
  Administered 2019-11-10: 11 [IU] via SUBCUTANEOUS
  Administered 2019-11-10 (×2): 8 [IU] via SUBCUTANEOUS
  Administered 2019-11-10: 11 [IU] via SUBCUTANEOUS
  Administered 2019-11-11 (×2): 15 [IU] via SUBCUTANEOUS
  Administered 2019-11-11: 11 [IU] via SUBCUTANEOUS
  Administered 2019-11-11: 15 [IU] via SUBCUTANEOUS

## 2019-11-06 MED ORDER — MIDAZOLAM HCL 2 MG/2ML IJ SOLN: 2.0000 mg | INTRAMUSCULAR | Status: AC

## 2019-11-06 MED ORDER — FENTANYL 2500MCG IN NS 250ML (10MCG/ML) PREMIX INFUSION
0.0000 ug/h | INTRAVENOUS | Status: DC
  Administered 2019-11-06: 200 ug/h via INTRAVENOUS
  Administered 2019-11-06: 50 ug/h via INTRAVENOUS
  Administered 2019-11-07: 100 ug/h via INTRAVENOUS
  Administered 2019-11-07: 250 ug/h via INTRAVENOUS
  Administered 2019-11-09: 100 ug/h via INTRAVENOUS
  Administered 2019-11-10: 75 ug/h via INTRAVENOUS
  Filled 2019-11-06 (×7): qty 250

## 2019-11-06 MED ORDER — FUROSEMIDE 10 MG/ML IJ SOLN: 20.0000 mg | Freq: Once | INTRAMUSCULAR | Status: DC

## 2019-11-06 MED ORDER — PANTOPRAZOLE SODIUM 40 MG PO PACK
40.0000 mg | PACK | Freq: Two times a day (BID) | ORAL | Status: DC
  Filled 2019-11-06: qty 20

## 2019-11-06 MED ORDER — NOREPINEPHRINE 4 MG/250ML-% IV SOLN
INTRAVENOUS | Status: AC
  Filled 2019-11-06: qty 250

## 2019-11-06 MED ORDER — NORTRIPTYLINE HCL 25 MG PO CAPS
50.0000 mg | ORAL_CAPSULE | Freq: Every day | ORAL | Status: DC
  Administered 2019-11-06 – 2019-11-10 (×5): 50 mg
  Filled 2019-11-06 (×6): qty 2

## 2019-11-06 MED ORDER — SODIUM BICARBONATE 8.4 % IV SOLN
50.0000 meq | Freq: Once | INTRAVENOUS | Status: AC
  Administered 2019-11-06: 50 meq via INTRAVENOUS
  Filled 2019-11-06: qty 50

## 2019-11-06 MED ORDER — ORAL CARE MOUTH RINSE
15.0000 mL | OROMUCOSAL | Status: DC
  Administered 2019-11-06 – 2019-11-11 (×51): 15 mL via OROMUCOSAL

## 2019-11-06 MED ORDER — POLYETHYLENE GLYCOL 3350 17 G PO PACK: 17.0000 g | PACK | Freq: Every day | ORAL | Status: DC

## 2019-11-06 MED ORDER — PANTOPRAZOLE SODIUM 40 MG PO PACK: 40.0000 mg | PACK | Freq: Every day | ORAL | Status: DC

## 2019-11-06 MED ORDER — FENTANYL CITRATE (PF) 100 MCG/2ML IJ SOLN
25.0000 ug | INTRAMUSCULAR | Status: DC | PRN
  Administered 2019-11-06: 100 ug via INTRAVENOUS
  Filled 2019-11-06: qty 2

## 2019-11-06 MED ORDER — FENTANYL CITRATE (PF) 100 MCG/2ML IJ SOLN: 25.0000 ug | INTRAMUSCULAR | Status: DC | PRN

## 2019-11-06 MED ORDER — CHLORHEXIDINE GLUCONATE 0.12% ORAL RINSE (MEDLINE KIT)
15.0000 mL | Freq: Two times a day (BID) | OROMUCOSAL | Status: DC
  Administered 2019-11-06 – 2019-11-11 (×11): 15 mL via OROMUCOSAL

## 2019-11-06 MED ORDER — SODIUM BICARBONATE 8.4 % IV SOLN
50.0000 meq | Freq: Once | INTRAVENOUS | Status: DC
  Filled 2019-11-06 (×2): qty 50

## 2019-11-06 MED ORDER — SUCCINYLCHOLINE CHLORIDE 20 MG/ML IJ SOLN
INTRAMUSCULAR | Status: DC | PRN
  Administered 2019-11-06: 120 mg via INTRAVENOUS

## 2019-11-06 MED ORDER — MELATONIN 3 MG PO TABS
3.0000 mg | ORAL_TABLET | Freq: Every day | ORAL | Status: DC
  Administered 2019-11-06 – 2019-11-10 (×5): 3 mg
  Filled 2019-11-06 (×5): qty 1

## 2019-11-06 MED ORDER — SODIUM CHLORIDE 0.9 % IV SOLN
3.0000 g | Freq: Two times a day (BID) | INTRAVENOUS | Status: DC
  Administered 2019-11-06 – 2019-11-09 (×7): 3 g via INTRAVENOUS
  Filled 2019-11-06 (×7): qty 8

## 2019-11-06 MED ORDER — DOCUSATE SODIUM 50 MG/5ML PO LIQD
100.0000 mg | Freq: Two times a day (BID) | ORAL | Status: DC
  Administered 2019-11-06 – 2019-11-10 (×9): 100 mg
  Filled 2019-11-06 (×9): qty 10

## 2019-11-06 MED ORDER — SERTRALINE HCL 50 MG PO TABS
25.0000 mg | ORAL_TABLET | ORAL | Status: DC
  Administered 2019-11-07 – 2019-11-11 (×5): 25 mg
  Filled 2019-11-06 (×6): qty 1

## 2019-11-06 MED ORDER — FUROSEMIDE 10 MG/ML IJ SOLN
60.0000 mg | Freq: Four times a day (QID) | INTRAMUSCULAR | Status: AC
  Administered 2019-11-06 (×2): 60 mg via INTRAVENOUS
  Filled 2019-11-06 (×2): qty 6

## 2019-11-06 MED ORDER — DEXTROSE 10 % IV SOLN: INTRAVENOUS | Status: DC

## 2019-11-06 MED ORDER — ATORVASTATIN CALCIUM 40 MG PO TABS
80.0000 mg | ORAL_TABLET | Freq: Every day | ORAL | Status: DC
  Administered 2019-11-06: 80 mg
  Filled 2019-11-06: qty 2

## 2019-11-06 MED ORDER — EZETIMIBE 10 MG PO TABS
5.0000 mg | ORAL_TABLET | Freq: Every day | ORAL | Status: DC
  Administered 2019-11-06: 5 mg
  Filled 2019-11-06 (×2): qty 0.5

## 2019-11-06 MED ORDER — MIDODRINE HCL 5 MG PO TABS
10.0000 mg | ORAL_TABLET | Freq: Three times a day (TID) | ORAL | Status: DC
  Administered 2019-11-06 – 2019-11-11 (×16): 10 mg
  Filled 2019-11-06 (×16): qty 2

## 2019-11-06 MED ORDER — NOREPINEPHRINE 4 MG/250ML-% IV SOLN
2.0000 ug/min | INTRAVENOUS | Status: DC
  Administered 2019-11-06: 3 ug/min via INTRAVENOUS

## 2019-11-06 MED ORDER — METOPROLOL TARTRATE 12.5 MG HALF TABLET: 50.0000 mg | ORAL_TABLET | Freq: Three times a day (TID) | ORAL | Status: DC

## 2019-11-06 MED ORDER — MIDAZOLAM HCL 2 MG/2ML IJ SOLN
1.0000 mg | INTRAMUSCULAR | Status: AC | PRN
  Administered 2019-11-07 (×3): 1 mg via INTRAVENOUS
  Filled 2019-11-06 (×5): qty 2

## 2019-11-06 MED ORDER — DOCUSATE SODIUM 50 MG/5ML PO LIQD: 100.0000 mg | Freq: Two times a day (BID) | ORAL | Status: DC

## 2019-11-06 MED ORDER — FERROUS SULFATE 300 (60 FE) MG/5ML PO SYRP
300.0000 mg | ORAL_SOLUTION | ORAL | Status: DC
  Administered 2019-11-06 – 2019-11-11 (×3): 300 mg
  Filled 2019-11-06 (×4): qty 5

## 2019-11-06 MED ORDER — DEXMEDETOMIDINE HCL IN NACL 400 MCG/100ML IV SOLN: 0.4000 ug/kg/h | INTRAVENOUS | Status: DC

## 2019-11-06 MED ORDER — LORAZEPAM 2 MG/ML IJ SOLN
0.5000 mg | INTRAMUSCULAR | Status: DC
  Administered 2019-11-06: 0.5 mg via INTRAVENOUS
  Filled 2019-11-06: qty 1

## 2019-11-06 MED ORDER — DEXTROSE 50 % IV SOLN
INTRAVENOUS | Status: AC
  Filled 2019-11-06: qty 50

## 2019-11-06 MED ORDER — FENTANYL BOLUS VIA INFUSION
25.0000 ug | INTRAVENOUS | Status: DC | PRN
  Administered 2019-11-06 – 2019-11-11 (×7): 25 ug via INTRAVENOUS
  Filled 2019-11-06: qty 25

## 2019-11-06 MED ORDER — HEPARIN BOLUS VIA INFUSION
4000.0000 [IU] | Freq: Once | INTRAVENOUS | Status: AC
  Administered 2019-11-06: 4000 [IU] via INTRAVENOUS
  Filled 2019-11-06: qty 4000

## 2019-11-06 MED ORDER — ETOMIDATE 2 MG/ML IV SOLN
INTRAVENOUS | Status: DC | PRN
  Administered 2019-11-06: 60 mg via INTRAVENOUS

## 2019-11-06 MED ORDER — CLOPIDOGREL BISULFATE 75 MG PO TABS
75.0000 mg | ORAL_TABLET | Freq: Every day | ORAL | Status: DC
  Administered 2019-11-06 – 2019-11-11 (×6): 75 mg
  Filled 2019-11-06 (×6): qty 1

## 2019-11-06 MED ORDER — LEVOTHYROXINE SODIUM 25 MCG PO TABS
150.0000 ug | ORAL_TABLET | Freq: Every day | ORAL | Status: DC
  Administered 2019-11-07 – 2019-11-11 (×5): 150 ug
  Filled 2019-11-06 (×5): qty 6

## 2019-11-06 MED ORDER — SENNA 8.6 MG PO TABS
2.0000 | ORAL_TABLET | Freq: Every morning | ORAL | Status: DC
  Administered 2019-11-07 – 2019-11-09 (×3): 17.2 mg
  Filled 2019-11-06 (×3): qty 2

## 2019-11-06 MED ORDER — PANTOPRAZOLE SODIUM 40 MG PO PACK
40.0000 mg | PACK | Freq: Two times a day (BID) | ORAL | Status: DC
  Administered 2019-11-06 – 2019-11-11 (×11): 40 mg
  Filled 2019-11-06 (×12): qty 20

## 2019-11-06 MED ORDER — HEPARIN (PORCINE) 25000 UT/250ML-% IV SOLN
1750.0000 [IU]/h | INTRAVENOUS | Status: DC
  Administered 2019-11-06: 1450 [IU]/h via INTRAVENOUS
  Administered 2019-11-06: 1250 [IU]/h via INTRAVENOUS
  Administered 2019-11-08 – 2019-11-11 (×6): 1750 [IU]/h via INTRAVENOUS
  Filled 2019-11-06 (×9): qty 250

## 2019-11-06 MED ORDER — SODIUM CHLORIDE 0.9 % IV SOLN
250.0000 mL | INTRAVENOUS | Status: DC
  Administered 2019-11-06 – 2019-11-08 (×2): 250 mL via INTRAVENOUS

## 2019-11-06 MED ORDER — SENNA 8.6 MG PO TABS
1.0000 | ORAL_TABLET | Freq: Every day | ORAL | Status: DC
  Administered 2019-11-06 – 2019-11-10 (×5): 8.6 mg
  Filled 2019-11-06 (×5): qty 1

## 2019-11-06 MED ORDER — MIDAZOLAM HCL 2 MG/2ML IJ SOLN
1.0000 mg | INTRAMUSCULAR | Status: DC | PRN
  Administered 2019-11-07 – 2019-11-09 (×6): 1 mg via INTRAVENOUS
  Filled 2019-11-06 (×3): qty 2

## 2019-11-06 MED ORDER — FENTANYL CITRATE (PF) 100 MCG/2ML IJ SOLN: 25.0000 ug | Freq: Once | INTRAMUSCULAR | Status: AC

## 2019-11-06 MED FILL — Medication: Qty: 1 | Status: AC

## 2019-11-06 NOTE — Progress Notes (Signed)
ABG drawn and results given to Dr. Carlis Abbott  PH 7.43 PCO2 28.2 PO2 148 HCO3 18.9 SO2 99%  Verbal order received to decrease pt FiO2 to 45%. RT will continue to monitor.

## 2019-11-06 NOTE — Consult Note (Addendum)
NAME:  Matthew [name], MRN:  [id number], DOB:  [birth year], LOS: 8 ADMISSION DATE:  10/21/2019, CONSULTATION DATE:  11/06/19 REFERRING MD:  Matthew [name] - TRH, CHIEF COMPLAINT:  VFib arrest  Brief History   74 yo M admitted with NSTEMI, sustained VFib cardiac arrest overnight (5/28), with ROSC after approx 5 minutes   History of present illness   74 yo M PMH CAD s/p CABGx5, pAFib on eliquis, AAA, CKD IV, HTN, HLD, cocaine abuse, chronic hypotension who presents 5/20 with L sided chest pain. Pain was sudden onset while watching a basketball game. Initially pain described as dull, radiating down L arm. Denied concomitant diaphoresis, n/v, SOB. Endorsed similar sx 3 days prior, with diaphoresis and nausea, but resolved after nitro. Admitted with NSTEMI. Prior Matthew [name] 10/2018 with severe triple vessel CAD sp 5v CABG with 4/5 patent grafts and successful DES to mid body of SVF to OM2 OM3.  [name] course complicated by worsening GIB, s/p endoscopy 5/24, with clips placed to oozing gastric polyps. Cardiology has deferred cardiac cath due to AKI on CKD, GIB for which eliquis has been held. On 5/25 neurology was consulted for aphagia with AMS. CT H without signs of acute CVA. Felt to be toxic metabolic encephalopathy.  5/27 nephrology consulted for progressive AKI on CKD with solitary kidney.  [name] course marked by ongoing hypotensive episodes and ongoing complaints of chest pain.  5/28 pt sustained a VFib arrest. Transferred to ICU   Past Matthew [name] [name] Events   5/20 admitted with chest pain, GIB  On 5/25 neurology was consulted for aphagia with AMS. CT H without signs of acute CVA. Felt to be toxic metabolic encephalopathy.  5/27 nephrology consulted for progressive AKI on CKD with solitary kidney.  [name] course marked by ongoing hypotensive episodes and ongoing complaints of chest pain.  5/28 pt sustained a VFib arrest. Transferred to ICU   Consults:   Cardiology Neurology Nephrology PCCM   Procedures:  5/24 endoscopy 5/28 ETT>>  Significant Diagnostic Tests:  CT a/p 5/26> small bilateral pleural effusions. Lower lobe atelectasis, possible PNA. R infra hepatic fat stranding. No extraluminal gas or bowel wall thickening. Some stranding around midline pancreas  ECHO 5/25> LVEF 50-55%  Micro Data:  5/26 BCx> staph species x 1   Antimicrobials:  Cefepime, end 5/28 5/28 unasyn>>  Interim history/subjective:  S/p VFib arrest with ROSC Intubated, sedated, on pressors   Objective   Blood pressure 91/74, pulse (!) 110, temperature 97.6 F (36.4 C), temperature source Axillary, resp. rate (!) 22, height 6' (1.829 m), weight 131.7 kg, SpO2 100 %.    Vent Mode: PRVC FiO2 (%):  [100 %] 100 % Set Rate:  [20 bmp] 20 bmp Vt Set:  [620 mL] 620 mL PEEP:  [5 cmH20] 5 cmH20 Plateau Pressure:  [18 cmH20] 18 cmH20   Intake/Output Summary (Last 24 hours) at 11/06/2019 0715 Last data filed at 10/20/2019 2133 Gross per 24 hour  Intake 1320 ml  Output 550 ml  Net 770 ml   Filed Weights   11/04/19 6203 10/24/2019 0341 11/06/19 0427  Weight: 129.4 kg 129.8 kg 131.7 kg    Examination: General: Chronically and critically ill appearing older adult M, intubated and sedated NAD  HENT: NCAT pink mmm ETT secure. Trachea midline  Lungs: Bibasilar crackles. Symmetrical chest expansion. Mechanically ventilated Cardiovascular: irir s1s2 no rgm cap refill < 3 seconds Abdomen: soft round ndnt Extremities: Symmetrical bulk and tone. + edema. No clubbing Neuro: Sedated,  responds to noxious stimulation, not following commands GU: defer   Resolved [name] Problem list     Assessment & Plan:   Acute respiratory failure with hypoxia in setting of cardiac arrest requiring intubation  Hx OSA Possible CAP on CT, at risk for aspiration PNA following arrest  P -Cont MV -ABG in 1 hr -PAD, VAP  -empiric unasyn   VFib arrest -unclear if arrest  etiology possibly r/t documented respiratory complaints prior vs primary cardiac etiology P -Stat cmp mag phos, trops, ckmb -cards to re-eval this morning, planning TEE -- visualized clot in R atria  -dc antihypertensives  -heparin gtt -not candidate for cath per cards -consider amio if recurrent ventricular arrhythmia   Shock -hx chronic orthostatic hypotension  - in shock following cardiac arrest. Bedside TEE with LVEF 35-50%, more diffuse hypokinesis, LAA thrombus seen on TEE  P -NE for MAP goal > 65 -supportive care  -continue midodrine  -heart failure engaged by cards-- not much to add at this point   LAA thrombus -seen on bedside TEE by cards on 5/28 P -heparin gtt  NSTEMI CAD  Angina  P -cardiology following, not candidate for cath   Paroxysmal AF P -ICU monitoring  -heparin gtt  HTN HLD P -holding antihypertensives in setting of shock  AKI on CKD IV  -solitary kidney  -suspected AKI due to hypotension, now with ATN P -nephrology following, no indication for RRT yet  ABLA due to GI Bleed, s/p endoscopy with gastric polyp clipping GERD P -trend CBC, goal hgb> 8  AAA -3 x 3.2cm aneurysm of distal abdominal aorta P -op follow up imaging  -will need cocaine cessation counseling and compliance with BP medications   DM P -SSI + basal  Hypothyroidism P -synthroid   Depression P -nortriptyline, zoloft, zyprexa, trazodone   Best practice:  Diet: NPO Pain/Anxiety/Delirium protocol (if indicated): Prn fent VAP protocol (if indicated): yes  DVT prophylaxis: heparin gtt GI prophylaxis: protonix  Glucose control: SSI + basal  Mobility: BR Code Status: Full  Family Communication: critical care communication pending 5/28.  Disposition: ICU   Labs   CBC: Recent Labs  Lab 11/03/19 1430 11/03/19 1430 11/03/19 1939 11/04/19 0037 11/04/19 0343 10/17/2019 0725 11/06/19 0320  WBC 12.5*   < > 12.7* 15.5* 14.5* 10.1 16.8*  NEUTROABS 9.0*  --    --  12.3* 12.0* 7.4 12.9*  HGB 8.8*   < > 9.7* 10.2* 9.5* 8.6* 9.5*  HCT 29.6*   < > 32.0* 34.2* 32.1* 28.4* 30.0*  MCV 97.4   < > 98.2 98.6 98.5 96.3 97.4  PLT 303   < > 316 365 361 263 330   < > = values in this interval not displayed.    Basic Metabolic Panel: Recent Labs  Lab 11/04/19 0818 11/04/19 1019 11/04/19 1633 10/30/2019 0725 11/06/19 0320 11/06/19 0600  NA 130* 131* 131* 135 129*  --   K 5.8* 5.4* 5.7* 3.4* 4.7  --   CL 97* 99 100 98 93*  --   CO2 19* 19* 17* 24 20*  --   GLUCOSE 214* 239* 194* 101* 201*  --   BUN 31* 34* 39* 43* 54*  --   CREATININE 3.86* 3.84* 3.94* 3.57* 3.85*  --   CALCIUM 8.2* 8.1* 8.1* 8.2* 7.8*  --   MG  --   --   --  2.6*  --  2.8*  PHOS  --   --   --  5.1* 5.9*  --  GFR: Estimated Creatinine Clearance: 23.3 mL/min (A) (by C-G formula based on SCr of 3.85 mg/dL (H)). Recent Labs  Lab 11/04/19 0037 11/04/19 0037 11/04/19 0343 11/04/19 0818 11/04/19 1019 10/17/2019 0725 10/25/2019 1015 11/06/19 0320  WBC 15.5*  --  14.5*  --   --  10.1  --  16.8*  LATICACIDVEN 3.6*   < > 3.6* 3.5* 3.3*  --  2.0*  --    < > = values in this interval not displayed.    Liver Function Tests: Recent Labs  Lab 11/04/19 0037 10/31/2019 0725 11/06/19 0320  AST 72*  --   --   ALT 59*  --   --   ALKPHOS 36*  --   --   BILITOT 0.8  --   --   PROT 7.0  --   --   ALBUMIN 3.2* 3.0* 3.0*   Recent Labs  Lab 11/01/2019 1015  LIPASE 14   Recent Labs  Lab 11/03/19 1642  AMMONIA 20    ABG    Component Value Date/Time   PHART 7.395 11/06/2019 0326   PCO2ART 29.4 (L) 11/06/2019 0326   PO2ART 96.4 11/06/2019 0326   HCO3 17.7 (L) 11/06/2019 0326   TCO2 23 08/22/2019 0423   ACIDBASEDEF 6.3 (H) 11/06/2019 0326   O2SAT 96.0 11/06/2019 0326     Coagulation Profile: No results for input(s): INR, PROTIME in the last 168 hours.  Cardiac Enzymes: No results for input(s): CKTOTAL, CKMB, CKMBINDEX, TROPONINI in the last 168 hours.  HbA1C: Hgb A1c MFr  Bld  Date/Time Value Ref Range Status  10/15/2019 01:25 PM 6.6 (H) 4.8 - 5.6 % Final    Comment:    (NOTE) Pre diabetes:          5.7%-6.4% Diabetes:              >6.4% Glycemic control for   <7.0% adults with diabetes   08/22/2019 04:30 AM 7.6 (H) 4.8 - 5.6 % Final    Comment:    (NOTE) Pre diabetes:          5.7%-6.4% Diabetes:              >6.4% Glycemic control for   <7.0% adults with diabetes     CBG: Recent Labs  Lab 10/30/2019 0833 10/12/2019 1139 10/17/2019 1622 10/28/2019 2124 11/06/19 0157  GLUCAP 84 266* 133* 216* 215*    Review of Systems:   Unable to obtain, intubated sedated  Past Medical History  He,  has a past medical history of AAA (abdominal aortic aneurysm) ([place]), Anemia, Arthritis, Atrial fibrillation ([place]), CAD (coronary artery disease), Chronic constipation, CKD (chronic kidney disease), stage III, Cocaine abuse in remission ([place]), Depression, Diabetes mellitus, 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   Surgical History    Past Surgical History:  Procedure Laterality Date  . Bronchoscopy, right video-assisted thoracoscopy, minithoracotomy, drainage of pleural effusion, and decortication.  02/26/2011   [facility]  . CABG x 4  05/13/2009   [facility]  . CARDIAC SURGERY    . COLONOSCOPY W/ BIOPSIES AND POLYPECTOMY    . COLONOSCOPY WITH PROPOFOL N/A 11/07/2019   Procedure: COLONOSCOPY WITH PROPOFOL;  Surgeon: Rush Landmark Telford Nab., MD;  Location: [facility];  Service: Gastroenterology;  Laterality:  N/A;  . CORONARY STENT INTERVENTION N/A 10/10/2016   Procedure: Coronary Stent Intervention;   Surgeon: Troy Sine, MD;  Location: [facility];  Service: Cardiovascular;  Laterality: N/A;  . CORONARY STENT INTERVENTION N/A 10/15/2018   Procedure: CORONARY STENT INTERVENTION;  Surgeon: Burnell Blanks, MD;  Location: [facility];  Service: Cardiovascular;  Laterality: N/A;  . ENDOSCOPIC MUCOSAL RESECTION  11/07/2019   Procedure: ENDOSCOPIC MUCOSAL RESECTION;  Surgeon: Rush Landmark Telford Nab., MD;  Location: [facility] [facility];  Service: Gastroenterology;;  . ESOPHAGOGASTRODUODENOSCOPY (EGD) WITH PROPOFOL N/A 11/08/2019   Procedure: ESOPHAGOGASTRODUODENOSCOPY (EGD) WITH PROPOFOL;  Surgeon: Irving Copas., MD;  Location: [facility];  Service: Gastroenterology;  Laterality: N/A;  . HEMORRHOID SURGERY    . HEMOSTASIS CLIP PLACEMENT  10/28/2019   Procedure: HEMOSTASIS CLIP PLACEMENT;  Surgeon: Irving Copas., MD;  Location: [facility];  Service: Gastroenterology;;  . HEMOSTASIS CONTROL  10/22/2019   Procedure: HEMOSTASIS CONTROL;  Surgeon: Irving Copas., MD;  Location: [facility];  Service: Gastroenterology;;  . HERNIA REPAIR    . HOT HEMOSTASIS N/A 10/16/2019   Procedure: HOT HEMOSTASIS (ARGON PLASMA COAGULATION/BICAP);  Surgeon: Irving Copas., MD;  Location: [facility];  Service: Gastroenterology;  Laterality: N/A;  . Incision, drainage and debridement of perineum and  scrotum    . Irrigation and debridement of scrotal wound and closure of scrotal wound.    Marland Kitchen LEFT HEART CATH AND CORS/GRAFTS ANGIOGRAPHY N/A 10/10/2016   Procedure: Left Heart Cath and Cors/Grafts Angiography;  Surgeon: Troy Sine, MD;  Location: [facility];  Service: Cardiovascular;  Laterality: N/A;  . LEFT HEART CATH AND CORS/GRAFTS ANGIOGRAPHY N/A 10/15/2018   Procedure: LEFT HEART CATH AND CORS/GRAFTS ANGIOGRAPHY;  Surgeon: Burnell Blanks, MD;  Location: [facility];  Service: Cardiovascular;  Laterality: N/A;  . MULTIPLE TOOTH EXTRACTIONS     . RADIOLOGY WITH ANESTHESIA N/A 05/09/2017   Procedure: MRI WITH ANESTHESIA   BRAIN WITHOUT CONTRAST;  Surgeon: Radiologist, Medication, MD;  Location: [facility];  Service: Radiology;  Laterality: N/A;  . Right radical nephrectomy.    Lia Foyer LIFTING INJECTION  10/19/2019   Procedure: SUBMUCOSAL LIFTING INJECTION;  Surgeon: Rush Landmark Telford Nab., MD;  Location: [facility];  Service: Gastroenterology;;  . TOOTH EXTRACTION       Social History   reports that he has never smoked. He has never used smokeless tobacco. He reports previous drug use. Drug: Cocaine. He reports that he does not drink alcohol.   Family History   His family history includes Alzheimer's disease in his mother; Heart disease in his father; Pneumonia in an other family member. There is no history of Colon cancer, Esophageal cancer, Stomach cancer, or Rectal cancer.   Allergies Allergies  Allergen Reactions  . Amiodarone Other (See Comments)    Caused lung and liver toxicity  . Tetanus Toxoids Hives  . Morphine And Related Other (See Comments)    "made him crazy and heart was racing"     Home Medications  Prior to Admission medications   Medication Sig Start Date End Date Taking? Authorizing Provider  apixaban (ELIQUIS) 5 MG TABS tablet Take 5 mg by mouth 2 (two) times daily.   Yes [provider]  buPROPion (WELLBUTRIN XL) 300 MG 24 hr tablet Take 300 mg by mouth daily.   Yes [provider]  clopidogrel (PLAVIX) 75 MG tablet Take 1 tablet (75 mg total) by mouth daily with breakfast. 10/16/18  Yes Rai,  Ripudeep K, MD  dronedarone (MULTAQ) 400 MG tablet Take 400 mg by mouth 2 (two) times daily with a meal.   Yes [provider]  ezetimibe (ZETIA) 10 MG tablet Take 5 mg by mouth at bedtime.   Yes [provider]  ferrous sulfate 325 (65 FE) MG tablet Take 325 mg by mouth every Monday, Wednesday, and Friday.    Yes [provider]  finasteride (PROSCAR) 5 MG tablet Take  5 mg by mouth at bedtime.    Yes [provider]  gabapentin (NEURONTIN) 300 MG capsule Take 300 mg by mouth 2 (two) times daily.    Yes [provider]  Insulin Detemir (LEVEMIR FLEXPEN) 100 UNIT/ML Pen Inject 50 Units into the skin 2 (two) times daily.    Yes [provider]  levothyroxine (SYNTHROID) 150 MCG tablet Take 150 mcg by mouth daily before breakfast.    Yes [provider]  lidocaine (XYLOCAINE) 5 % ointment Apply 1 application topically 2 (two) times daily as needed (Neuropathy). Apply to feet 10/26/19  Yes [provider]  magnesium oxide (MAG-OX) 400 MG tablet Take 400 mg by mouth in the morning, at noon, and at bedtime.    Yes [provider]  Melatonin 3 MG CAPS Take 1 capsule by mouth at bedtime.   Yes [provider]  metoprolol succinate (TOPROL-XL) 25 MG 24 hr tablet Take 12.5 mg by mouth daily.   Yes [provider]  midodrine (PROAMATINE) 2.5 MG tablet Take 2.5 mg by mouth 3 (three) times daily. 10/22/19  Yes [provider]  nortriptyline (PAMELOR) 25 MG capsule Take 50 mg by mouth at bedtime.    Yes [provider]  Omega-3 Fatty Acids (FISH OIL) 1000 MG CAPS Take 2,000 mg by mouth 2 (two) times daily.    Yes [provider]  oxyCODONE-acetaminophen (PERCOCET) 10-325 MG per tablet Take 1 tablet by mouth every 6 (six) hours as needed for pain.    Yes [provider]  RABEprazole (ACIPHEX) 20 MG tablet Take 20 mg by mouth in the morning and at bedtime.   Yes [provider]  rosuvastatin (CRESTOR) 40 MG tablet Take 40 mg by mouth at bedtime.    Yes [provider]  senna (SENOKOT) 8.6 MG TABS tablet Take 1-2 tablets by mouth See admin instructions. Take 2 tablets by mouth in the morning and 1 tablet at bedtime   Yes [provider]  sertraline (ZOLOFT) 25 MG tablet Take 25 mg by mouth every morning.   Yes [provider]  tamsulosin  (FLOMAX) 0.4 MG CAPS capsule Take 0.4 mg by mouth daily.   Yes [provider]  triamcinolone cream (KENALOG) 0.1 % Apply 1 application topically daily as needed (rash).   Yes [provider]  VITAMIN D PO Take 1,000 Units by mouth daily.   Yes [provider]  memantine (NAMENDA) 10 MG tablet Take 10 mg by mouth 2 (two) times daily. 10/23/19   [provider]  traZODone (DESYREL) 100 MG tablet Take 100 mg by mouth at bedtime.    [provider]     Critical care time: 75 min      CRITICAL CARE Performed by: Cristal Generous   Total critical care time: 75  minutes  Critical care time was exclusive of separately billable procedures and treating other patients.  Critical care was necessary to treat or prevent imminent or life-threatening deterioration.  Critical care was time spent  personally by me on the following activities: development of treatment plan with patient and/or surrogate as well as nursing, discussions with consultants, evaluation of patient's response to treatment, examination of patient, obtaining history from patient or surrogate, ordering and performing treatments and interventions, ordering and review of laboratory studies, ordering and review of radiographic studies, pulse oximetry and re-evaluation of patient's condition.  Eliseo Gum MSN, AGACNP-BC [facility] [phone] If no answer, [phone] 11/06/2019, 8:50 AM

## 2019-11-06 NOTE — Progress Notes (Signed)
CODE BLUE note,  Date of code: 11/06/2019  CODE BLUE was called at 6:15 when the patient was found unresponsive in ventricular fibrillation on monitor.  CPR was immediately started and the patient received 1 DC shock followed by CPR and 1 mg of IV epinephrine.  He regained his pulse after epinephrine around 6:19.  His rhythm was showing ventricular tachycardia with a rate that was up to 180s.  I ordered 250 mg of IV amiodarone that brought his heart rate down to 100 and he will be continued on IV amiodarone drip.  He was then intubated with #8 ET tube at 23 cm.  He will be transferred to the ICU.  Internal medicine team will follow care with PCCM.

## 2019-11-06 NOTE — Progress Notes (Addendum)
TEE Bedside Verbal consent from wife Sedation Fentanyl and Precedex  Mild LVE EF 35-40% more diffuse hypokinesis slightly worse in inferior wall Moderate MR Moderate TR Moderate RV hypokinesis with mild dilatation Normal aorta with no dissection and minimal plaque No ASD/PFO No effusion  Large amount of LAA thrombus   Continue supportive care Lasix for CHF Amiodarone if needed for recurrent ventricular arrhythmia Start heparin for PAF and LAA thrombus  Vent management per CCM CXR does not look that bad in regard To CHF. ETT advance 4 cm before TEE  Wife has been updated at length  Jenkins Rouge MD [facility]

## 2019-11-06 NOTE — Progress Notes (Signed)
[place] for heparin Indication: chest pain/ACS and atrial fibrillation  Allergies  Allergen Reactions  . Amiodarone Other (See Comments)    Caused lung and liver toxicity  . Tetanus Toxoids Hives  . Morphine And Related Other (See Comments)    "made him crazy and heart was racing"    Patient Measurements: Height: 6' (182.9 cm) Weight: 131.7 kg (290 lb 5.5 oz) IBW/kg (Calculated) : 77.6 Heparin Dosing Weight: 107 kg  Vital Signs: Temp: 96.7 F (35.9 C) (05/28 1500) Temp Source: Axillary (05/28 1500) BP: 102/72 (05/28 1600) Pulse Rate: 56 (05/28 1600)  Labs: Recent Labs    10/13/2019 0725 11/06/2019 0725 11/07/2019 1501 10/31/2019 1638 11/06/19 0320 11/06/19 0824 11/06/19 0827 11/06/19 1057 11/06/19 1637  HGB 8.6*   < >  --   --  9.5* 9.1*  --   --   --   HCT 28.4*  --   --   --  30.0* 29.6*  --   --   --   PLT 263  --   --   --  330 307  --   --   --   HEPARINUNFRC  --   --   --   --   --   --   --   --  0.15*  CREATININE 3.57*  --   --   --  3.85* 4.16*  --   --   --   CKTOTAL  --   --   --   --   --   --  171  --   --   CKMB  --   --   --   --   --   --  9.7*  --   --   TROPONINIHS  --   --    < > 2,472*  --   --  2,001* 2,391*  --    < > = values in this interval not displayed.    Estimated Creatinine Clearance: 21.5 mL/min (A) (by C-G formula based on SCr of 4.16 mg/dL (H)).  Assessment: 74 yo m with complicated hospital course, now with VFib arrest this am. Has had a GI bleed, NSTEMI, Code stroke called during stay.  Hx of apixaban PTA for afib  Had endoscopy on 5/24 with clipped polyps Bedside TEE shows left atrial appendage clot MRI brain pending to evaluate for acute stroke per Neurology, ok with heparin drip for now, as unlikely this is large stroke per MD notes - will target lower goal with no boluses per CVA protocol until ruled out.  Initial heparin level low at 0.15. CBC low but stable. No bleeding or issues with  infusion per discussion with RN.  Goal of Therapy:  Heparin level 0.3-0.5 units/ml until acute stroke ruled out Monitor platelets by anticoagulation protocol: Yes   Plan:  Increase heparin to 1450 units/hr 8h heparin level Monitor daily heparin level and CBC, s/sx bleeding F/u Cardiology plans, stroke work-up   Arturo Morton, PharmD, BCPS Please check AMION for all [place] contact numbers Clinical Pharmacist 11/06/2019 5:21 PM

## 2019-11-06 NOTE — Progress Notes (Signed)
Initial Nutrition Assessment  DOCUMENTATION CODES:   Obesity unspecified  INTERVENTION:   Recommend initiate tube feeding via OG tube: Vital High Protein at 60 ml/h (1440 ml per day) Pro-stat 30 ml TID  Provides 1740 kcal, 171 gm protein, 1204 ml free water daily   NUTRITION DIAGNOSIS:   Inadequate oral intake related to inability to eat as evidenced by NPO status.  GOAL:   Provide needs based on ASPEN/SCCM guidelines  MONITOR:   Vent status, Labs, I & O's, Skin  REASON FOR ASSESSMENT:   Ventilator    ASSESSMENT:   74 yo male admitted with L sided chest pain, NSTEMI, GIB. PMH includes CAD, CABG x 5, PAF, AAA, CKD IV, HTN, HLD, cocaine abuse, chronic hypotension, solitary kidney.   5/24 S/P endoscopy with clips placed to oozing gastric polyps. 5/25 Neuro consulted for AMS & aphagia, suspected to be caused by toxic metabolic encephalopathy. 5/27 progressive AKI on CKD, Nephrology consulted.  5/28 V fib arrest, transferred to the ICU and intubated.   Prior to intubation, patient was on a CHO modified diet, consuming 100% of meals.   Patient is currently intubated on ventilator support, requiring 1 pressor.  MV: 11.4 L/min Temp (24hrs), Avg:97.9 F (36.6 C), Min:97.2 F (36.2 C), Max:99.3 F (37.4 C)   Labs reviewed. Na 130 (L), BUN 59 (H), creat 4.16 (H), Mag 2.5 (H) CBG: [phone]  Medications reviewed and include colace, ferrous sulfate, lasix, novolog, levemir, miralax, senokot, levophed.  Admission weight 123.8 kg Currently 131.7 kg I/O +3.7 L  NUTRITION - FOCUSED PHYSICAL EXAM:  unable to complete  Diet Order:   Diet Order            Diet Carb Modified Fluid consistency: Thin; Room service appropriate? Yes  Diet effective now              EDUCATION NEEDS:   Not appropriate for education at this time  Skin:  Skin Assessment: Reviewed RN Assessment(MASD to coccyx)  Last BM:  5/27 (rectal pouch)  Height:   Ht Readings from Last 1  Encounters:  11/06/19 6' (1.829 m)    Weight:   Wt Readings from Last 1 Encounters:  11/06/19 131.7 kg    Ideal Body Weight:  80.9 kg  BMI:  Body mass index is 39.38 kg/m.  Estimated Nutritional Needs:   Kcal:  1450-1840  Protein:  >/= 162 gm  Fluid:  2 L    Lucas Mallow, RD, LDN, CNSC Please refer to Amion for contact information.

## 2019-11-06 NOTE — Progress Notes (Signed)
MD notified again at approximately 0445 about concern for this patient's increasing oxygen requirements and continuing agitation/confusion. Received orders for echocardiogram, BNP, and 63m IV push lasix.   At approximately 0500 rapid response notified as well, requested rapid nurse come to the bedside and evaluate patient over these concerns. Rapid response spoke with MD and orders were placed.   At 0Bakercode blue was initiated on patient, ROSC was achieved and patient was intubated and transferred to 50M.  The patient's wife was notified of the code blue and updated on patient's room location in the ICU.   CElesa Hacker RN

## 2019-11-06 NOTE — Transfer of Care (Signed)
Immediate Anesthesia Transfer of Care Note  Patient: Matthew [name]  Procedure(s) Performed: AN AD [place]  Patient Location: Nursing Unit  Anesthesia Type:General  Level of Consciousness: sedated  Airway & Oxygen Therapy: Patient remains intubated per anesthesia plan and Patient placed on Ventilator (see vital sign flow sheet for setting)  Post-op Assessment: Report given to RN and Post -op Vital signs reviewed and stable  Post vital signs: Reviewed and stable  Last Vitals:  Vitals Value Taken Time  BP    Temp    Pulse    Resp 17 11/06/19 0634  SpO2    Vitals shown include unvalidated device data.  Last Pain:  Vitals:   11/06/19 0427  TempSrc: Axillary  PainSc:       Patients Stated Pain Goal: 0 (46/99/78 0208)  Complications: No apparent anesthesia complications

## 2019-11-06 NOTE — Progress Notes (Signed)
Progress Note  Patient Name: Matthew [name] Date of Encounter: 11/06/2019  Primary Cardiologist: Larae Grooms, MD   Subjective   Events this am noted not clear to me if he had primary Vfib arrest or hypoxic event with  Arrhythmia Currently intubated with stable rhythm and chronically low BP. Have updated wife and gotten verbal consent to do bedside TEE Discussed with CCM  Inpatient Medications    Scheduled Meds: . sodium chloride   Intravenous Once  . atorvastatin  80 mg Per Tube Daily  . Chlorhexidine Gluconate Cloth  6 each Topical Daily  . clopidogrel  75 mg Per Tube Daily  . docusate  100 mg Per Tube BID  . ezetimibe  5 mg Per Tube QHS  . ferrous sulfate  300 mg Per Tube Q M,W,F  . finasteride  5 mg Oral QHS  . fluticasone  2 spray Each Nare Daily  . furosemide  20 mg Intravenous Once  . heparin  4,000 Units Intravenous Once  . insulin aspart  0-15 Units Subcutaneous Q4H  . insulin detemir  25 Units Subcutaneous BID  . [START ON 11/07/2019] levothyroxine  150 mcg Per Tube QAC breakfast  . melatonin  3 mg Per Tube QHS  . metoprolol tartrate  50 mg Per Tube TID  . midodrine  10 mg Per Tube TID WC  . nortriptyline  50 mg Per Tube QHS  . pantoprazole sodium  40 mg Per Tube BID  . polyethylene glycol  17 g Per Tube Daily  . [START ON 11/07/2019] senna  2 tablet Per Tube q AM   And  . senna  1 tablet Per Tube QHS  . [START ON 11/07/2019] sertraline  25 mg Per Tube BH-q7a  . sodium bicarbonate  50 mEq Intravenous Once  . sodium chloride flush  3 mL Intravenous Q12H  . traZODone  100 mg Per Tube QHS   Continuous Infusions: . ampicillin-sulbactam (UNASYN) IV    . fentaNYL infusion INTRAVENOUS    . heparin    . nitroGLYCERIN Stopped (11/03/19 1438)  . norepinephrine     PRN Meds: acetaminophen, alum & mag hydroxide-simeth **AND** lidocaine, fentaNYL, fentaNYL (SUBLIMAZE) injection, fentaNYL (SUBLIMAZE) injection, HYDROmorphone (DILAUDID) injection, lidocaine,  midazolam, midazolam, nitroGLYCERIN, OLANZapine zydis, polyethylene glycol, simethicone, sodium chloride, triamcinolone cream   Vital Signs    Vitals:   11/06/19 0427 11/06/19 0651 11/06/19 0700 11/06/19 0800  BP: 91/74     Pulse:  (!) 110    Resp: 18 (!) 22    Temp: 97.6 F (36.4 C)  (!) 97.2 F (36.2 C)   TempSrc: Axillary  Axillary   SpO2: 91% 100%    Weight: 131.7 kg   131.7 kg  Height:    6' (1.829 m)    Intake/Output Summary (Last 24 hours) at 11/06/2019 0816 Last data filed at 11/01/2019 2133 Gross per 24 hour  Intake 960 ml  Output 550 ml  Net 410 ml   Last 3 Weights 11/06/2019 11/06/2019 11/01/2019  Weight (lbs) 290 lb 5.5 oz 290 lb 6.4 oz 286 lb 2.5 oz  Weight (kg) 131.7 kg 131.725 kg 129.8 kg      Telemetry    SR PVC 11/06/2019   ECG     SR RBBB LAFB minimal J point elevation inferiorly no acute changes   Physical Exam   Intubated  Obese male  HEENT: normal Neck supple with no adenopathy JVP normal no bruits no thyromegaly Rhonchi bilateral  Heart:  S1/S2 no murmur,  no rub, gallop or click PMI normal post sternotomy  Abdomen: benighn, BS positve, no tenderness, no AAA no bruit.  No HSM or HJR Distal pulses intact with no bruits Plus one bilateral edema Neuro non-focal Skin warm and dry No muscular weakness   Labs    High Sensitivity Troponin:   Recent Labs  Lab 11/04/19 0818 11/04/19 1019 11/04/19 1633 11/04/2019 1501 10/17/2019 1638  TROPONINIHS 5,652* 5,140* 4,573* 2,376* 2,472*      Chemistry Recent Labs  Lab 11/04/19 0037 11/04/19 0343 11/04/19 1633 11/03/2019 0725 11/06/19 0320  NA 130*   < > 131* 135 129*  K 6.1*   < > 5.7* 3.4* 4.7  CL 96*   < > 100 98 93*  CO2 19*   < > 17* 24 20*  GLUCOSE 229*   < > 194* 101* 201*  BUN 25*   < > 39* 43* 54*  CREATININE 3.38*   < > 3.94* 3.57* 3.85*  CALCIUM 8.5*   < > 8.1* 8.2* 7.8*  PROT 7.0  --   --   --   --   ALBUMIN 3.2*  --   --  3.0* 3.0*  AST 72*  --   --   --   --   ALT 59*  --    --   --   --   ALKPHOS 36*  --   --   --   --   BILITOT 0.8  --   --   --   --   GFRNONAA 17*   < > 14* 16* 14*  GFRAA 19*   < > 16* 18* 17*  ANIONGAP 15   < > 14 13 16*   < > = values in this interval not displayed.     Hematology Recent Labs  Lab 11/04/19 0343 10/24/2019 0725 11/06/19 0320  WBC 14.5* 10.1 16.8*  RBC 3.26* 2.95* 3.08*  HGB 9.5* 8.6* 9.5*  HCT 32.1* 28.4* 30.0*  MCV 98.5 96.3 97.4  MCH 29.1 29.2 30.8  MCHC 29.6* 30.3 31.7  RDW 15.9* 16.6* 17.2*  PLT 361 263 330     Radiology    DG Chest 1 View  Result Date: 11/06/2019 CLINICAL DATA:  Increasing oxygen requirements. EXAM: CHEST  1 VIEW COMPARISON:  10/10/2019. FINDINGS: Endotracheal tube noted with tip 4 cm above the carina. NG tube noted with tip below left hemidiaphragm. Prior CABG. Cardiomegaly. Bilateral interstitial prominence. Tiny pleural effusions cannot be excluded. Findings suggest CHF. IMPRESSION: 1. Endotracheal tube noted with tip 4 cm above the carina. NG tube noted with tip below left hemidiaphragm. 2. Prior CABG. Cardiomegaly with bilateral interstitial prominence. Tiny bilateral pleural effusions cannot be excluded. Findings consistent with CHF. Electronically Signed   By: Marcello Moores  Register   On: 11/06/2019 08:08   DG CHEST PORT 1 VIEW  Result Date: 11/06/2019 CLINICAL DATA:  Chest pain and weakness EXAM: PORTABLE CHEST 1 VIEW COMPARISON:  11/03/2019 FINDINGS: Single frontal view of the chest demonstrates a stable cardiac silhouette. Postsurgical changes from median sternotomy. Small bilateral pleural effusions are again identified. No focal lung consolidation. Central vascular prominence unchanged. No pneumothorax. IMPRESSION: 1. Persistent central vascular prominence with small bilateral pleural effusions. No significant change since prior study. Electronically Signed   By: Randa Ngo M.D.   On: 10/26/2019 19:42    Cardiac Studies   Echo 10/30/19:  1. Left ventricular ejection fraction,  by estimation, is 50 to 55%. The  left ventricle has low normal  function. Left ventricular endocardial  border not optimally defined to evaluate regional wall motion. There is  moderate left ventricular hypertrophy.  Left ventricular diastolic parameters are indeterminate. Elevated left  ventricular end-diastolic pressure.  2. Right ventricular systolic function is mildly reduced. The right  ventricular size is moderately enlarged. There is moderately elevated  pulmonary artery systolic pressure.  3. The mitral valve is degenerative. Trivial mitral valve regurgitation.  No evidence of mitral stenosis.  4. The aortic valve is abnormal. Aortic valve regurgitation is not  visualized. Aortic valve mean gradient measures 7.0 mmHg. Aortic valve has  moderately reduced cusp mobility, suggesting gradient may be  underestimated, with at least mild aortic valve  stenosis.  5. The inferior vena cava is normal in size with greater than 50%  respiratory variability, suggesting right atrial pressure of 3 mmHg.    Cardiac cath 10/15/18   Prox RCA lesion is 100% stenosed.  SVG graft was visualized by angiography and is normal in caliber.  Prox Cx to Mid Cx lesion is 100% stenosed.  SVG graft was visualized by angiography and is normal in caliber.  Ost LAD to Prox LAD lesion is 100% stenosed.  LIMA graft was visualized by angiography and is normal in caliber.  SVG graft was not injected.  Mid Graft lesion before 2nd Mrg is 99% stenosed.  A drug-eluting stent was successfully placed using a STENT RESOLUTE [id number].  Post intervention, there is a 0% residual stenosis.  1. 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 Chronic occlusion proximal LAD. Patent LIMA to mid LAD. Chronic occlusion of the vein graft to the Diagonal branch (not injected-known occlusion) 3. Chronic occlusion mid Circumflex. Patent sequential vein graft to OM2/OM3. Severe stenosis mid body of SVG  to OM2/OM3 within the old stent 4. Successful PTCA/DES x 1 mid body of SVG to OM2/OM3 5. Chronic occlusion proximal RCA. Patent SVG to distal RCA  Recommendations: ASA and Plavix along with Eliquis for one month. Would stop ASA at one month and continue Plavix along with Eliquis for at least one year. Continue statin and beta blocker. Resume Eliquis tomorrow.    Patient Profile     74 y.o. male with a hx of coronary artery disease, CABG, hypertension, hyperlipidemia, paroxysmal atrial fibrillation, hypothyroidism, CKD who presented 10/19/2019 with chest pain and was found to have a GI bleedand  elevated troponins, elevated Cr and acute anemia with drop in Hgb from 16 to 8.  Heme+ stools. Eliquis and Plavix held.   Assessment & Plan    1.  Non-STEMI/CAD: Troponin's stayed elevated his pains have not been cardiac sounding He is not a candidate for cath due to MS changes, acute GI bleed and now renal failure with solitary kidney.  Marland Kitchen Plavix restarted 48 hours ago. Troponin still elevated but coming down Arrhythmic event this am Multaq had been d/c due to renal failure Rhythm stable for now QT ok Can start amiodarone if he has a lot of ectopy Discussed with CCM doing bedside TEE to assess aorta and EF post event Got verbal consent from wife   2.  Acute GI bleed: multiple stomach polyps one actively bleeding cauterized  Hct   28.4 this am  Protonix will start heparin today in light of event this am and stability of Hb  3.  Paroxysmal atrial fibrillation: sinus post coed multaq d/c started on heparin   4.  Hypotension:  Chronic better with higher dose midodrine but now intubated on  positive pressure ventilation stable   5.  Hyperlipidemia: On rosuvastatin.  6.  Acute kidney injury: solitary kidney good urine output precludes cath Cr 3.85 today     For questions or updates, please contact [place] Please consult [email or web address] for contact info under        Signed, Jenkins Rouge, MD   11/06/2019, 8:16 AM

## 2019-11-06 NOTE — Anesthesia Procedure Notes (Signed)
Procedure Name: Intubation Date/Time: 11/06/2019 6:35 AM Performed by: Clovis Cao, CRNA Pre-anesthesia Checklist: Patient identified, Emergency Drugs available, Suction available, Patient being monitored and Timeout performed Patient Re-evaluated:Patient Re-evaluated prior to induction Oxygen Delivery Method: Ambu bag Preoxygenation: Pre-oxygenation with 100% oxygen Induction Type: IV induction, Rapid sequence and Cricoid Pressure applied Grade View: Grade I Tube type: Oral Tube size: 8.0 mm Number of attempts: 1 Airway Equipment and Method: Video-laryngoscopy and Stylet Placement Confirmation: ETT inserted through vocal cords under direct vision,  breath sounds checked- equal and bilateral and CO2 detector Secured at: 23 cm Tube secured with: Tape Dental Injury: Teeth and Oropharynx as per pre-operative assessment

## 2019-11-06 NOTE — Progress Notes (Signed)
Patient ABG resulted with bicarb of 17.7. MD paged. Orders received to give patient one amp of sodium bicarb now and another one in two hours. Will continue to monitor patient.   Elesa Hacker, RN

## 2019-11-06 NOTE — Progress Notes (Addendum)
Beta-blocker 2.0 just in case level and call with any abnormalities to the morning Dr. Deirdre Pippins and make sure he has a potassium I can subjective: The patient was seen and examined for concern about increasing O2 requirement that is questionable though given an ABG earlier this morning that showed a pH 7.395, PCO2 29.4, PO2 was 96.4, HCO3 of 17.7 and O2 sat of 96% on 2 L of O2 by nasal cannula.  At that time he was ordered an amp of sodium bicarbonate to be repeated in 2 hours.  He was fairly somnolent but arousable he was alert and oriented to his name.  He refused to use his CPAP nightly.  He denied any chest pain or worsening dyspnea or cough or wheezing.  No fever or chills.  No nausea or vomiting.  No reported arrhythmias.  Objective: His physical exam revealed acutely ill mildly confused, lethargic elderly Caucasian male with no acute distress.  Neck was supple with no JVD.  Cardiovascular with regular rate and rhythm with normal S1-S2 no murmurs gallops or rubs.  Lungs revealed diminished bibasal breath sounds with occasional rhonchi and possibly mild crackles.  Abdomen was soft nontender with positive bowel sounds and extremities with trace bilateral pitting edema with no clubbing or cyanosis.  All his labs were reviewed.  CBC showed leukocytosis of 16.8 this morning up from 10.1 yesterday with neutrophilia and improving hemoglobin and hematocrit.  Assessment/plan: Leukocytosis with elevated lactic acid of 2 yesterday and bilateral pleural effusion, likely with fluid overload, but will need to rule out airspace disease that could be associated with parapneumonic effusion with subsequent acute hypoxic respiratory failure with compensated metabolic acidosis.  His creatinine at this time is not allowing IV contrast and therefore will obtain a chest CT without contrast this morning for further assessment.  I will hold off antibiotic therapy at this time pending results.  He was ordered 20 mg of IV Lasix  given his soft blood pressure and a BMP that is currently pending.  We will utilize his as needed Ativan to help him maintain his CPAP with his obstructive sleep apnea.  We will continue rest of the plan of care as follows:  1 non-STEMI/CAD Patient had presented with chest pain that woke him up from sleep not relieved with nitroglycerin.  Chest x-ray initially on admission noted to be clear.  Patient noted to have a cardiac catheterization 10/29/2018 that showed severe triple-vessel disease with patent grafts and had a DES placed at the mid body of SVG to OM 2/OM 3.  Cardiac enzymes noted to be continuing elevated but slowly trending down.  Patient with ongoing chest pain this morning after eating his breakfast with substernal chest pain that he describes as a pressure not relieved on GI cocktail initially.  2D echo obtained with a EF of 50 to 55%, moderate LVH, LV endocardial border not optimally defined to evaluate regional wall motion, elevated left ventricular end-diastolic pressure, mildly reduced right ventricular systolic function, right ventricular size moderately enlarged, moderately elevated pulmonary artery systolic pressure.  Trivial mitral valve regurgitation, aortic valve abnormal.  Patient on admission initially noted to be in A. fib with RVR and currently in normal sinus rhythm.  Patient being followed by cardiology and due to patient's solitary kidney with worsening renal function with creatinine up to 3.69 despite attempts with hydration doubt that patient will likely have a cardiac catheterization this week per cardiology.  Plavix started back per cardiology 48 hours post endoscopy with clipping of  bleeding polyp with stable hemoglobin.  Continue statin, Multaq, Zetia, Lopressor.  Continue increased midodrine dose to 10 mg 3 times daily due to chronic hypotension.  Place on IV Dilaudid as needed chest pain.  Cardiology following and appreciate input and recommendations.  2.  Acute renal  failure on chronic kidney disease stage IV and a solitary kidney Questionable etiology.  Creatinine currently at 3.57 from 3.69 from 1.96 (10/30/2019 ). Likely secondary to problem #1 with probable volume overload in the setting of chronic hypotension and poor renal perfusion.  CT abdomen and pelvis which was done was negative for hydronephrosis.  Urine sodium < 10.  Urine creatinine 214.56. Continue midodrine to 10 mg 3 times daily.  Avoid nephrotoxin agents.  Due to solitary kidney and worsening renal function nephrology consulted and are following.  Follow.  3.  Paroxysmal atrial fibrillation Patient currently in normal sinus rhythm this morning.  Continue Multaq and Lopressor for rate control.  Anticoagulation on hold secondary to recent acute GI bleed.  Plavix resumed per cardiology.  Per cardiology.  4.  Chronic hypotension Patient with low blood pressure with systolics in the 56L to the 90s.  BP improved on increased dose of midodrine 10 mg 3 times daily.  Follow.  5.  Hyperlipidemia Statin.   6.  Upper GI bleed/acute blood loss anemia Patient noted to have a previous baseline hemoglobin of approximately 15.1 on 08/22/2019.  Patient presented with hemoglobin which dropped as low as 8.8.  FOBT positive.  Patient's Eliquis and Plavix held.  Patient seen in consultation by GI and patient underwent colonoscopy and upper endoscopy.  Upper endoscopy showed medium hiatal hernia, multiple gastric polyps 1 which was large and oozing blood and one smaller with stigmata of recent bleeding status post resection and clipping.  Gastritis also noted.  Colonoscopy showed nonbleeding hemorrhoids.  Patient also with multiple 2 to 6 mm polyps throughout the colon.  GI had recommended to restart patient's Eliquis and Plavix at the latest date acceptable to cardiology.  Status post IV Feraheme x1.  Status post 1 unit transfusion packed red blood cells 11/01/2019.  H&H stable at 8.6 today.  Monitor closely as Plavix  has been resumed.  Continue PPI twice daily.  Will need outpatient follow-up with GI.  7.  Diabetes mellitus type II with polyneuropathy Hemoglobin A1c of 7.6 on 08/22/2019.  CBG of 84 this morning.  Continue current dose of Levemir, sliding scale insulin.  8.  UTI Urinalysis done cloudy, trace leukocytes, nitrite negative, 21-50 WBCs.  Patient noted to be hypotensive yesterday.  Patient noted to have a leukocytosis.  Patient pancultured.  Patient started empirically on IV Rocephin and will treat for total of 3 days..  Follow.  9.  Acute metabolic encephalopathy Questionable etiology.  Patient noted with some dysarthric speech, confusion and agitation.  Patient seen by neurology, head CT done with no acute abnormalities noted.  Patient's anticoagulation was held during the hospitalization due to upper GI bleed.  Per neurology patient will need MRI to evaluate for watershed infarcts.  Avoid hypotension.  Concerned that patient's confusion/agitation likely due to hospital delirium.  Minimize sedating medications.  Patient pancultured.  Continue empiric IV Rocephin.  Improved clinically.  Neurology following and I appreciate the input and recommendations.  10.  Hypothyroidism Continue Synthroid.  Outpatient follow-up.  11.  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  Will likely need follow-up surveillance this month or next month  which may be done in the outpatient setting.  12.  Depression Continue Zoloft, Wellbutrin, nortriptyline, trazodone.  Dosages adjusted per pharmacy due to worsening renal function.  13.  History of memory loss Patient noted to recently have been started on Namenda.  14.  Hyperlipidemia Continue statin, fenofibrate, Zetia.    15.  Gastroesophageal reflux disease Continue PPI.   16.  OSA on CPAP CPAP nightly.  Further management will depend upon hospital course.  Authorized and performed by: Eugenie Norrie, MD Total  critical care time: Approximately 35  minutes. Due to a high probability of clinically significant, life-threatening deterioration, the patient required my highest level of preparedness to intervene emergently and I personally spent this critical care time directly and personally managing the patient.  This critical care time included obtaining a history, examining the patient, pulse oximetry, ordering and review of studies, arranging urgent treatment with development of management plan, evaluation of patient's response to treatment, frequent reassessment, and discussions with other providers. This critical care time was performed to assess and manage the high probability of imminent, life-threatening deterioration that could result in multiorgan failure.  It was exclusive of separately billable procedures and treating other patients and teaching time.  Please see MDM section and the rest of the note for further information on patient assessment and treatment.

## 2019-11-06 NOTE — Progress Notes (Signed)
Received patient from [place] after a code blue with the code team and his nurse. Report received at the bedside.  Levophed drip started due to being hypotensive. Patient does respond to pain but unable to follow commands. Day shift RN is at bedside and was updated about the event.

## 2019-11-06 NOTE — Progress Notes (Signed)
ABG results given to Dr. Carlis Abbott: MD aware sample is mixed/ venous instead. MD stated that was fine.   PH 7.42 PCO2 35.2 PO2 40 HCO3 23.1 SO2 76%  Verbal order received to leave pt on current settings. RT will continue to monitor.

## 2019-11-06 NOTE — Progress Notes (Addendum)
ANTICOAGULATION CONSULT NOTE - Initial Consult  Pharmacy Consult for heparin Indication: chest pain/ACS and atrial fibrillation  Allergies  Allergen Reactions  . Amiodarone Other (See Comments)    Caused lung and liver toxicity  . Tetanus Toxoids Hives  . Morphine And Related Other (See Comments)    "made him crazy and heart was racing"    Patient Measurements: Height: 6' (182.9 cm) Weight: 131.7 kg (290 lb 5.5 oz) IBW/kg (Calculated) : 77.6 Heparin Dosing Weight: 107 kg  Vital Signs: Temp: 97.2 F (36.2 C) (05/28 0700) Temp Source: Axillary (05/28 0700) BP: 91/74 (05/28 0427) Pulse Rate: 110 (05/28 0651)  Labs: Recent Labs    11/04/19 0343 11/04/19 0343 11/04/19 0818 11/04/19 1633 11/01/2019 0725 11/04/2019 1501 11/06/2019 1638 11/06/19 0320  HGB 9.5*   < >  --   --  8.6*  --   --  9.5*  HCT 32.1*  --   --   --  28.4*  --   --  30.0*  PLT 361  --   --   --  263  --   --  330  CREATININE 3.69*  --    < > 3.94* 3.57*  --   --  3.85*  TROPONINIHS 4,560*  --    < > 4,573*  --  2,376* 2,472*  --    < > = values in this interval not displayed.    Estimated Creatinine Clearance: 23.3 mL/min (A) (by C-G formula based on SCr of 3.85 mg/dL (H)).  Assessment: 74 yo m with complicated hospital course, now with VFib arrest this am. Has had a GI bleed, NSTEMI, Code stroke called during stay.  Hx of apixaban PTA for afib  Had endoscopy on 5/24 with clipped polyps Bedside TEE shows left atrial appendage clot  CBC low but stable  Goal of Therapy:  Heparin level 0.3-0.7 units/ml Monitor platelets by anticoagulation protocol: Yes   Plan:  Heparin bolus 4000 units x 1 - ok w dr Carlis Abbott to give full bolus Heparin gtt 1250 units/hr Initial hep lvl 1700 Daily HL CBC F/U Cards plans, watch for bleeding.  Barth Kirks, PharmD, BCPS, BCCCP Clinical Pharmacist [phone]  Please check AMION for all [place] numbers  11/06/2019 8:21 AM

## 2019-11-06 NOTE — Progress Notes (Addendum)
[facility] Progress Note   Subjective:   Unfortunately overnight the patient had V. fib arrest requiring resuscitation and defibrillation.  The patient was able to achieve ROSC but had ventricular tachycardia requiring amiodarone.  Patient has continued to make urine since this event.  TEE at bedside overnight demonstrated ejection fraction of 35 to 40% with diffuse hypokinesis in left atrial thrombus.  The patient is intubated at this time.  Objective Vitals:   11/06/19 0800 11/06/19 0900 11/06/19 1000 11/06/19 1100  BP: _0 105/64  Pulse: 90 (!) 53 (!) 56 (!) 55  Resp: _1 Temp:    98.3 F (36.8 C)  TempSrc:    Oral  SpO2: 100% 100% 100% 100%  Weight: 131.7 kg     Height: 6' (1.829 m)      Physical Exam General: Intubated sedated Heart: Normal rate Lungs: Ventilated, coarse bilateral breath sounds Abdomen: soft, obese Extremities: Trace edema in the bilateral lower extremity GU: foley draining clear yellow urine  Additional Objective Labs: Basic Metabolic Panel: Recent Labs  Lab 11/04/2019 0725 11/06/19 0320 11/06/19 0824  NA 135 129* 130*  K 3.4* 4.7 4.8  CL 98 93* 95*  CO2 24 20* 19*  GLUCOSE 101* 201* 207*  BUN 43* 54* 59*  CREATININE 3.57* 3.85* 4.16*  CALCIUM 8.2* 7.8* 7.7*  PHOS 5.1* 5.9*  --    Liver Function Tests: Recent Labs  Lab 11/04/19 0037 11/04/19 0037 10/15/2019 0725 11/06/19 0320 11/06/19 0824  AST 72*  --   --   --  536*  ALT 59*  --   --   --  1,035*  ALKPHOS 36*  --   --   --  38  BILITOT 0.8  --   --   --  0.6  PROT 7.0  --   --   --  6.3*  ALBUMIN 3.2*   < > 3.0* 3.0* 3.0*   < > = values in this interval not displayed.   Recent Labs  Lab 11/09/2019 1015  LIPASE 14   CBC: Recent Labs  Lab 11/04/19 0037 11/04/19 0037 11/04/19 0343 11/04/19 0343 10/22/2019 0725 11/06/19 0320 11/06/19 0824  WBC 15.5*   < > 14.5*   < > 10.1 16.8* 16.4*  NEUTROABS 12.3*   < > 12.0*  --  7.4 12.9*  --   HGB  10.2*   < > 9.5*   < > 8.6* 9.5* 9.1*  HCT 34.2*   < > 32.1*   < > 28.4* 30.0* 29.6*  MCV 98.6  --  98.5  --  96.3 97.4 96.4  PLT 365   < > 361   < > 263 330 307   < > = values in this interval not displayed.   Blood Culture    Component Value Date/Time   SDES URINE, CATHETERIZED 11/04/2019 0832   SPECREQUEST Normal 11/04/2019 0832   CULT  11/04/2019 0832    NO GROWTH Performed at [facility], [place] [address] [place], [place] [address]    REPTSTATUS 11/07/2019 FINAL 11/04/2019 0832    Cardiac Enzymes: Recent Labs  Lab 11/06/19 0827  CKTOTAL 171  CKMB 9.7*   CBG: Recent Labs  Lab 10/23/2019 2124 11/06/19 0157 11/06/19 0657 11/06/19 0736 11/06/19 1128  GLUCAP 216* 215* 165* 192* 162*   Iron Studies: No results for input(s): IRON, TIBC, TRANSFERRIN, FERRITIN in the last 72 hours.  Studies/Results: DG Chest 1 View  Result Date:  11/06/2019 CLINICAL DATA:  Increasing oxygen requirements. EXAM: CHEST  1 VIEW COMPARISON:  11/09/2019. FINDINGS: Endotracheal tube noted with tip 4 cm above the carina. NG tube noted with tip below left hemidiaphragm. Prior CABG. Cardiomegaly. Bilateral interstitial prominence. Tiny pleural effusions cannot be excluded. Findings suggest CHF. IMPRESSION: 1. Endotracheal tube noted with tip 4 cm above the carina. NG tube noted with tip below left hemidiaphragm. 2. Prior CABG. Cardiomegaly with bilateral interstitial prominence. Tiny bilateral pleural effusions cannot be excluded. Findings consistent with CHF. Electronically Signed   By: Marcello Moores  Register   On: 11/06/2019 08:08   DG CHEST PORT 1 VIEW  Result Date: 10/26/2019 CLINICAL DATA:  Chest pain and weakness EXAM: PORTABLE CHEST 1 VIEW COMPARISON:  11/03/2019 FINDINGS: Single frontal view of the chest demonstrates a stable cardiac silhouette. Postsurgical changes from median sternotomy. Small bilateral pleural effusions are again identified. No focal lung consolidation. Central vascular  prominence unchanged. No pneumothorax. IMPRESSION: 1. Persistent central vascular prominence with small bilateral pleural effusions. No significant change since prior study. Electronically Signed   By: Randa Ngo M.D.   On: 10/21/2019 19:42   Medications: . sodium chloride Stopped (11/06/19 1044)  . ampicillin-sulbactam (UNASYN) IV 200 mL/hr at 11/06/19 1100  . fentaNYL infusion INTRAVENOUS 100 mcg/hr (11/06/19 1100)  . heparin 1,250 Units/hr (11/06/19 1100)  . norepinephrine    . norepinephrine (LEVOPHED) Adult infusion 3 mcg/min (11/06/19 1100)   . sodium chloride   Intravenous Once  . atorvastatin  80 mg Per Tube Daily  . chlorhexidine gluconate (MEDLINE KIT)  15 mL Mouth Rinse BID  . Chlorhexidine Gluconate Cloth  6 each Topical Daily  . clopidogrel  75 mg Per Tube Daily  . docusate  100 mg Per Tube BID  . ezetimibe  5 mg Per Tube QHS  . ferrous sulfate  300 mg Per Tube Q M,W,F  . finasteride  5 mg Oral QHS  . fluticasone  2 spray Each Nare Daily  . furosemide  20 mg Intravenous Once  . insulin aspart  0-15 Units Subcutaneous Q4H  . insulin detemir  25 Units Subcutaneous BID  . [START ON 11/07/2019] levothyroxine  150 mcg Per Tube QAC breakfast  . mouth rinse  15 mL Mouth Rinse 10 times per day  . melatonin  3 mg Per Tube QHS  . midazolam  2 mg Intravenous Q30 min  . midodrine  10 mg Per Tube TID WC  . nortriptyline  50 mg Per Tube QHS  . pantoprazole sodium  40 mg Per Tube BID  . polyethylene glycol  17 g Per Tube Daily  . [START ON 11/07/2019] senna  2 tablet Per Tube q AM   And  . senna  1 tablet Per Tube QHS  . [START ON 11/07/2019] sertraline  25 mg Per Tube [id number]  . sodium bicarbonate  50 mEq Intravenous Once  . sodium chloride flush  3 mL Intravenous Q12H  . traZODone  100 mg Per Tube QHS    Assessment/Plan **AKI on CKD 4:   Baseline creatinine 2-2.2.  Possible solitary kidney.  Had bleeding with some degree of shock likely causing ATN however work-up has been  reassuring and no other obvious signs of insult.  Renal function was improving but now worsened in the setting of cardiac arrest.  Urine output has continued which is reassuring.  We will need to monitor him closely as he may need renal replacement therapy.  Need to discuss extensively with family before starting.  He has expressed that he would want this as a last resort  **Ventricular fibrillation: Possibly secondary to cardiac ischemia or hypoxia.  Primary team and cardiology managing.  We will continue to follow  **Shock: History of such without any obvious infection.  He has been on midodrine with some improvement.  Now with worsening shock related to ventricular fibrillation and cardiac arrest.  Continue norepinephrine per primary team and antibiotics as deemed necessary.  Continue midodrine.  **NSTEMI, h/o CAD with h/o PCI:  Cardiology following, holding on LHC for now in light of GIB and AKI.  No plans for LHC in light of high risk for CIN, dialysis.    **Anemia: acute blood loss s/p resection of gastric polyps.  Transfusion per primary.   **Hyperkalemia: History of such that improved with Lokelma and Kayexalate.  Potassium was significantly improved now rising likely secondary to worsening kidney injury.  Level 4.8 today.  Will consider Lokelma if necessary  **Atrial fib: per cardiology.   **Hyponatremia: Sodium 130 today.  Likely associated with free water retention in the setting of AKI as well as heart failure.  We will continue to monitor

## 2019-11-06 NOTE — Progress Notes (Signed)
Charge nurse called in by nurse tech saying that patient was agitated and pulling lines/gown off. When nurse arrived to room patient was moaning, not responding to my commands/questions, eyes were wide open. CBG 215, Rectal temp 99.5, BP 99/48, HR 50-55s,. Placed cpap on patient.  Paged [facility], got orders for iv ativan and ABG. Will continue to monitor patient closely.

## 2019-11-06 NOTE — Progress Notes (Signed)
Reason for consult:   Subjective: Patient had V. fib arrest yesterday status post CPR.  Patient intubated sedated but moving all 4 extremities spontaneously.  TEE catheter shows left atrial thrombus and patient started on heparin drip.   ROS: Unable to obtain as patient intubated on sedation  Examination  Vital signs in last 24 hours: Temp:  [96.7 F (35.9 C)-99.3 F (37.4 C)] 96.7 F (35.9 C) (05/28 1500) Pulse Rate:  [53-110] 55 (05/28 1500) Resp:  [18-22] 20 (05/28 1500) BP: (91-119)/(48-78) 107/69 (05/28 1500) SpO2:  [91 %-100 %] 100 % (05/28 1500) FiO2 (%):  [45 %-100 %] 45 % (05/28 1200) Weight:  [131.7 kg] 131.7 kg (05/28 0800)  General: lying in bed CVS: pulse-normal rate and rhythm RS: breathing comfortably with assistance of ventilator Extremities: normal   Neuro: MS: Sedated, not following commands but does move spontaneously. CN: pupils equal and reactive,  EOMI, cough reflex present Motor: Moves all 4 extremities against gravity with good strength Reflexes: None none Hello a.m. doing good with going on probably was going on plantars: flexor Gait: not tested  Basic Metabolic Panel: Recent Labs  Lab 11/04/19 1019 11/04/19 1019 11/04/19 1633 11/04/19 1633 11/03/2019 0725 11/06/19 0320 11/06/19 0600 11/06/19 0824  NA 131*  --  131*  --  135 129*  --  130*  K 5.4*  --  5.7*  --  3.4* 4.7  --  4.8  CL 99  --  100  --  98 93*  --  95*  CO2 19*  --  17*  --  24 20*  --  19*  GLUCOSE 239*  --  194*  --  101* 201*  --  207*  BUN 34*  --  39*  --  43* 54*  --  59*  CREATININE 3.84*  --  3.94*  --  3.57* 3.85*  --  4.16*  CALCIUM 8.1*   < > 8.1*   < > 8.2* 7.8*  --  7.7*  MG  --   --   --   --  2.6*  --  2.8* 2.5*  PHOS  --   --   --   --  5.1* 5.9*  --   --    < > = values in this interval not displayed.    CBC: Recent Labs  Lab 11/03/19 1430 11/03/19 1939 11/04/19 0037 11/04/19 0343 10/12/2019 0725 11/06/19 0320 11/06/19 0824  WBC 12.5*   < > 15.5*  14.5* 10.1 16.8* 16.4*  NEUTROABS 9.0*  --  12.3* 12.0* 7.4 12.9*  --   HGB 8.8*   < > 10.2* 9.5* 8.6* 9.5* 9.1*  HCT 29.6*   < > 34.2* 32.1* 28.4* 30.0* 29.6*  MCV 97.4   < > 98.6 98.5 96.3 97.4 96.4  PLT 303   < > 365 361 263 330 307   < > = values in this interval not displayed.     Coagulation Studies: No results for input(s): LABPROT, INR in the last 72 hours.  Imaging Reviewed:     ASSESSMENT AND PLAN  74 year old male with CAD, CABG, CKD with AKI admitted for chest pain and upper GI bleed. Neurology was consulted on 5/25 after code stroke was called due to question of aphasia/confusion-felt to be related to pain medications. Patient was doing better the following day, however intermittently will have agitation. MRI brain has been ordered was pending to rule out acute infarct. Yesterday patient had Vfib arrest -CPR performed and patient  intubated. Echo showed left atrial appendage thrombus and patient started on heparin drip.  Acute toxic metabolic encephalopathy Delirium Acute kidney injury Possible acute stroke V. fib arrest Left atrial appendage thrombus  Recommendations MRI brain without contrast to evaluate for acute stroke but can also assist to evaluate for anoxic injury Agree with heparin drip-risk of future embolic strokes high with left atrial thrombus, unlikely patient has a large stroke involving all 4 extremities and therefore benefit greater than risk of hemorrhage even if he were to have a stroke. Frequent neurochecks Will need PT OT after extubation  Neurology will continue to follow   [place] Pager Number [phone] For questions after 7pm please refer to AMION to reach the Neurologist on call

## 2019-11-06 NOTE — Plan of Care (Signed)
Daughter updated at bedside. MRI brain tonight at 10pm. Follow up trop, lactate scheduled for 11pm.  Julian Hy, DO 11/06/19 6:53 PM [facility]

## 2019-11-06 NOTE — Progress Notes (Addendum)
RN came to me while I was in another patient's room to let me know results of patient's ABG.  PaO2 was in upper 90s but CO2 was in 20s and well as a low HCO3 at 17.7.  MD called, took patient off CPAP and placed on HFNC ([place]) at 9L.  Current sat is now in upper 90s.  On CPAP with O2 bled into line, sats were in upper 80s.  Patient still seems lethargic, RN aware and has put call into MD.

## 2019-11-06 NOTE — Progress Notes (Signed)
[facility] Progress Note Patient Name: KHALEEF RUBY DOB: Jun 19, 1945 MRN: [id number]   Date of Service  11/06/2019  HPI/Events of Note   Afib with controlled ventricular response rate.  eICU Interventions  Monitor patient for spontaneous termination, check electrolytes.         Kerry Kass Luberta Grabinski 11/06/2019, 11:22 PM

## 2019-11-06 NOTE — Progress Notes (Signed)
  Echocardiogram 2D Echocardiogram has been performed.  [name] 11/06/2019, 9:02 AM

## 2019-11-06 NOTE — Progress Notes (Signed)
Chaplain responded to [employer] [place] on 6E.  Followed patient as he was tranferred to 37M.  De Burrs Chaplain Resident

## 2019-11-07 ENCOUNTER — Inpatient Hospital Stay (HOSPITAL_COMMUNITY): Payer: 59

## 2019-11-07 DIAGNOSIS — R092 Respiratory arrest: Secondary | ICD-10-CM

## 2019-11-07 DIAGNOSIS — I472 Ventricular tachycardia: Secondary | ICD-10-CM

## 2019-11-07 DIAGNOSIS — I5043 Acute on chronic combined systolic (congestive) and diastolic (congestive) heart failure: Secondary | ICD-10-CM

## 2019-11-07 LAB — CALCIUM, IONIZED: Calcium, Ionized, Serum: 4.1 mg/dL — ABNORMAL LOW (ref 4.5–5.6)

## 2019-11-07 LAB — BASIC METABOLIC PANEL
Anion gap: 19 — ABNORMAL HIGH (ref 5–15)
Anion gap: 14 (ref 5–15)
BUN: 63 mg/dL — ABNORMAL HIGH (ref 8–23)
BUN: 62 mg/dL — ABNORMAL HIGH (ref 8–23)
CO2: 17 mmol/L — ABNORMAL LOW (ref 22–32)
CO2: 21 mmol/L — ABNORMAL LOW (ref 22–32)
Calcium: 7.8 mg/dL — ABNORMAL LOW (ref 8.9–10.3)
Calcium: 7.7 mg/dL — ABNORMAL LOW (ref 8.9–10.3)
Chloride: 97 mmol/L — ABNORMAL LOW (ref 98–111)
Chloride: 98 mmol/L (ref 98–111)
Creatinine, Ser: 3.81 mg/dL — ABNORMAL HIGH (ref 0.61–1.24)
Creatinine, Ser: 3.43 mg/dL — ABNORMAL HIGH (ref 0.61–1.24)
GFR calc Af Amer: 17 mL/min — ABNORMAL LOW (ref >60)
GFR calc Af Amer: 19 mL/min — ABNORMAL LOW (ref >60)
GFR calc non Af Amer: 15 mL/min — ABNORMAL LOW (ref >60)
GFR calc non Af Amer: 17 mL/min — ABNORMAL LOW (ref >60)
Glucose, Bld: 82 mg/dL (ref 70–99)
Glucose, Bld: 120 mg/dL — ABNORMAL HIGH (ref 70–99)
Potassium: 3.2 mmol/L — ABNORMAL LOW (ref 3.5–5.1)
Potassium: 4.5 mmol/L (ref 3.5–5.1)
Sodium: 133 mmol/L — ABNORMAL LOW (ref 135–145)
Sodium: 133 mmol/L — ABNORMAL LOW (ref 135–145)

## 2019-11-07 LAB — CBC
HCT: 29 % — ABNORMAL LOW (ref 39.0–52.0)
Hemoglobin: 9.3 g/dL — ABNORMAL LOW (ref 13.0–17.0)
MCH: 30.4 pg (ref 26.0–34.0)
MCHC: 32.1 g/dL (ref 30.0–36.0)
MCV: 94.8 fL (ref 80.0–100.0)
Platelets: 235 10*3/uL (ref 150–400)
RBC: 3.06 MIL/uL — ABNORMAL LOW (ref 4.22–5.81)
RDW: 17.7 % — ABNORMAL HIGH (ref 11.5–15.5)
WBC: 9.7 10*3/uL (ref 4.0–10.5)
nRBC: 1 % — ABNORMAL HIGH (ref 0.0–0.2)

## 2019-11-07 LAB — HEPARIN LEVEL (UNFRACTIONATED)
Heparin Unfractionated: 0.12 IU/mL — ABNORMAL LOW (ref 0.30–0.70)
Heparin Unfractionated: 0.29 IU/mL — ABNORMAL LOW (ref 0.30–0.70)
Heparin Unfractionated: 0.41 IU/mL (ref 0.30–0.70)

## 2019-11-07 LAB — COMPREHENSIVE METABOLIC PANEL
ALT: 843 U/L — ABNORMAL HIGH (ref 0–44)
AST: 317 U/L — ABNORMAL HIGH (ref 15–41)
Albumin: 2.7 g/dL — ABNORMAL LOW (ref 3.5–5.0)
Alkaline Phosphatase: 40 U/L (ref 38–126)
Anion gap: 14 (ref 5–15)
BUN: 61 mg/dL — ABNORMAL HIGH (ref 8–23)
CO2: 23 mmol/L (ref 22–32)
Calcium: 7.8 mg/dL — ABNORMAL LOW (ref 8.9–10.3)
Chloride: 97 mmol/L — ABNORMAL LOW (ref 98–111)
Creatinine, Ser: 3.74 mg/dL — ABNORMAL HIGH (ref 0.61–1.24)
GFR calc Af Amer: 17 mL/min — ABNORMAL LOW (ref >60)
GFR calc non Af Amer: 15 mL/min — ABNORMAL LOW (ref >60)
Glucose, Bld: 100 mg/dL — ABNORMAL HIGH (ref 70–99)
Potassium: 2.9 mmol/L — ABNORMAL LOW (ref 3.5–5.1)
Sodium: 134 mmol/L — ABNORMAL LOW (ref 135–145)
Total Bilirubin: 0.9 mg/dL (ref 0.3–1.2)
Total Protein: 6.1 g/dL — ABNORMAL LOW (ref 6.5–8.1)

## 2019-11-07 LAB — GLUCOSE, CAPILLARY
Glucose-Capillary: 110 mg/dL — ABNORMAL HIGH (ref 70–99)
Glucose-Capillary: 124 mg/dL — ABNORMAL HIGH (ref 70–99)
Glucose-Capillary: 127 mg/dL — ABNORMAL HIGH (ref 70–99)
Glucose-Capillary: 152 mg/dL — ABNORMAL HIGH (ref 70–99)
Glucose-Capillary: 60 mg/dL — ABNORMAL LOW (ref 70–99)
Glucose-Capillary: 64 mg/dL — ABNORMAL LOW (ref 70–99)
Glucose-Capillary: 76 mg/dL (ref 70–99)
Glucose-Capillary: 98 mg/dL (ref 70–99)

## 2019-11-07 LAB — PROCALCITONIN: Procalcitonin: 0.73 ng/mL

## 2019-11-07 LAB — MAGNESIUM
Magnesium: 2.5 mg/dL — ABNORMAL HIGH (ref 1.7–2.4)
Magnesium: 2.4 mg/dL (ref 1.7–2.4)

## 2019-11-07 LAB — MRSA PCR SCREENING: MRSA by PCR: NEGATIVE

## 2019-11-07 LAB — LACTIC ACID, PLASMA
Lactic Acid, Venous: 1.5 mmol/L (ref 0.5–1.9)
Lactic Acid, Venous: 1.5 mmol/L (ref 0.5–1.9)

## 2019-11-07 LAB — TROPONIN I (HIGH SENSITIVITY)
Troponin I (High Sensitivity): 5520 ng/L (ref <18)
Troponin I (High Sensitivity): 5904 ng/L (ref <18)

## 2019-11-07 MED ORDER — POTASSIUM CHLORIDE 10 MEQ/100ML IV SOLN
10.0000 meq | Freq: Once | INTRAVENOUS | Status: AC
  Administered 2019-11-07: 10 meq via INTRAVENOUS
  Filled 2019-11-07: qty 100

## 2019-11-07 MED ORDER — POTASSIUM CHLORIDE 20 MEQ/15ML (10%) PO SOLN
20.0000 meq | Freq: Once | ORAL | Status: AC
  Administered 2019-11-07: 20 meq
  Filled 2019-11-07: qty 15

## 2019-11-07 MED ORDER — FUROSEMIDE 10 MG/ML IJ SOLN
60.0000 mg | Freq: Once | INTRAMUSCULAR | Status: AC
  Administered 2019-11-07: 60 mg via INTRAVENOUS
  Filled 2019-11-07: qty 6

## 2019-11-07 MED ORDER — DEXTROSE 50 % IV SOLN
INTRAVENOUS | Status: AC
  Administered 2019-11-07: 50 mL
  Filled 2019-11-07: qty 50

## 2019-11-07 MED ORDER — LABETALOL HCL 5 MG/ML IV SOLN: 5.0000 mg | INTRAVENOUS | Status: DC | PRN

## 2019-11-07 MED ORDER — POTASSIUM CHLORIDE 20 MEQ/15ML (10%) PO SOLN
20.0000 meq | Freq: Once | ORAL | Status: AC
  Administered 2019-11-07: 20 meq via ORAL
  Filled 2019-11-07: qty 15

## 2019-11-07 MED ORDER — CHLORHEXIDINE GLUCONATE CLOTH 2 % EX PADS
6.0000 | MEDICATED_PAD | Freq: Every day | CUTANEOUS | Status: DC
  Administered 2019-11-08 – 2019-11-11 (×4): 6 via TOPICAL

## 2019-11-07 NOTE — Progress Notes (Signed)
[facility] Progress Note   Subjective:   Largely unchanged at this time.  Patient is intubated unable to voice complaints.  Wife at bedside going through a lot.  She does not voice any concerns  Objective Vitals:   11/07/19 0634 11/07/19 0648 11/07/19 0700 11/07/19 0753  BP: 97/64 95/67 91/65    Pulse: 82 82 87   Resp: 16 18 17    Temp:    (!) 97.4 F (36.3 C)  TempSrc:    Axillary  SpO2: 99% 98% 98%   Weight:      Height:       Physical Exam General: Intubated sedated Heart: Normal rate Lungs: Ventilated, clear to auscultation anteriorly Abdomen: soft, obese Extremities: 1+ edema in the bilateral lower extremity GU: foley draining clear yellow urine  Additional Objective Labs: Basic Metabolic Panel: Recent Labs  Lab 11/01/2019 0725 11/04/2019 0725 11/06/19 0320 11/06/19 0320 11/06/19 0092 11/06/19 0824 11/06/19 1806 11/06/19 2327 11/07/19 0215  NA 135   < > 129*   < > 130*   < > 133* 133* 134*  K 3.4*   < > 4.7   < > 4.8   < > 3.4* 3.2* 2.9*  CL 98   < > 93*   < > 95*  --   --  97* 97*  CO2 24   < > 20*   < > 19*  --   --  17* 23  GLUCOSE 101*   < > 201*   < > 207*  --   --  82 100*  BUN 43*   < > 54*   < > 59*  --   --  63* 61*  CREATININE 3.57*   < > 3.85*   < > 4.16*  --   --  3.81* 3.74*  CALCIUM 8.2*   < > 7.8*   < > 7.7*  --   --  7.8* 7.8*  PHOS 5.1*  --  5.9*  --   --   --   --   --   --    < > = values in this interval not displayed.   Liver Function Tests: Recent Labs  Lab 11/04/19 0037 10/13/2019 0725 11/06/19 0320 11/06/19 0824 11/07/19 0215  AST 72*  --   --  536* 317*  ALT 59*  --   --  1,035* 843*  ALKPHOS 36*  --   --  38 40  BILITOT 0.8  --   --  0.6 0.9  PROT 7.0  --   --  6.3* 6.1*  ALBUMIN 3.2*   < > 3.0* 3.0* 2.7*   < > = values in this interval not displayed.   Recent Labs  Lab 10/28/2019 1015  LIPASE 14   CBC: Recent Labs  Lab 11/04/19 0343 11/04/19 0343 11/09/2019 0725 10/13/2019 0725 11/06/19 0320  11/06/19 0320 11/06/19 0824 11/06/19 1806 11/07/19 0215  WBC 14.5*   < > 10.1   < > 16.8*  --  16.4*  --  9.7  NEUTROABS 12.0*  --  7.4  --  12.9*  --   --   --   --   HGB 9.5*   < > 8.6*   < > 9.5*   < > 9.1* 9.2* 9.3*  HCT 32.1*   < > 28.4*   < > 30.0*   < > 29.6* 27.0* 29.0*  MCV 98.5  --  96.3  --  97.4  --  96.4  --  94.8  PLT 361   < > 263   < > 330  --  307  --  235   < > = values in this interval not displayed.   Blood Culture    Component Value Date/Time   SDES BLOOD LEFT HAND 11/06/2019 0824   SDES BLOOD RIGHT HAND 11/06/2019 0824   SPECREQUEST  11/06/2019 0824    BOTTLES DRAWN AEROBIC ONLY Blood Culture adequate volume   SPECREQUEST  11/06/2019 0824    BOTTLES DRAWN AEROBIC ONLY Blood Culture adequate volume   CULT  11/06/2019 0824    NO GROWTH < 24 HOURS Performed at [facility], [place] [address] [place], [place] [address]    CULT  11/06/2019 0824    NO GROWTH < 24 HOURS Performed at [facility], [place] [address], [place], [place] [address]    REPTSTATUS PENDING 11/06/2019 0824   REPTSTATUS PENDING 11/06/2019 0824    Cardiac Enzymes: Recent Labs  Lab 11/06/19 0827  CKTOTAL 171  CKMB 9.7*   CBG: Recent Labs  Lab 11/07/19 0050 11/07/19 0138 11/07/19 0347 11/07/19 0434 11/07/19 0759  GLUCAP 64* 110* 60* 127* 76   Iron Studies: No results for input(s): IRON, TIBC, TRANSFERRIN, FERRITIN in the last 72 hours.  Studies/Results: DG Chest 1 View  Result Date: 11/06/2019 CLINICAL DATA:  Increasing oxygen requirements. EXAM: CHEST  1 VIEW COMPARISON:  10/17/2019. FINDINGS: Endotracheal tube noted with tip 4 cm above the carina. NG tube noted with tip below left hemidiaphragm. Prior CABG. Cardiomegaly. Bilateral interstitial prominence. Tiny pleural effusions cannot be excluded. Findings suggest CHF. IMPRESSION: 1. Endotracheal tube noted with tip 4 cm above the carina. NG tube noted with tip below left hemidiaphragm. 2. Prior CABG. Cardiomegaly with  bilateral interstitial prominence. Tiny bilateral pleural effusions cannot be excluded. Findings consistent with CHF. Electronically Signed   By: Marcello Moores  Register   On: 11/06/2019 08:08   MR BRAIN WO CONTRAST  Result Date: 11/07/2019 CLINICAL DATA:  Focal neuro deficit, > 6 hrs, stroke suspected concern for CVA EXAM: MRI HEAD WITHOUT CONTRAST TECHNIQUE: Multiplanar, multiecho pulse sequences of the brain and surrounding structures were obtained without intravenous contrast. COMPARISON:  None. FINDINGS: BRAIN: No acute infarct, acute hemorrhage or extra-axial collection. Early confluent hyperintense T2-weighted signal of the periventricular and deep white matter, most commonly due to chronic ischemic microangiopathy. There is generalized atrophy without lobar predilection. There are multiple chronic microhemorrhages in a predominantly central distribution. Normal midline structures. 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 VASCULAR: Major flow voids are preserved. SKULL AND UPPER CERVICAL SPINE: Normal calvarium and skull base. Visualized upper cervical spine and soft tissues are normal. SINUSES/ORBITS: No paranasal sinus fluid levels or advanced mucosal thickening. No mastoid or middle ear effusion. Normal orbits. IMPRESSION: 1. No acute intracranial abnormality. 2. Generalized atrophy and chronic ischemic microangiopathy. 3. Right basal ganglia and left cerebellar microhemorrhages, unchanged. Electronically Signed   By: Ulyses Jarred M.D.   On: 11/07/2019 00:58   DG CHEST PORT 1 VIEW  Result Date: 11/07/2019 CLINICAL DATA:  Intubated EXAM: PORTABLE CHEST 1 VIEW COMPARISON:  Chest radiograph from one day prior. FINDINGS: Endotracheal tube tip is 3.3 cm above the carina. Enteric tube enters stomach with the tip not seen on this image. Stable cardiomediastinal silhouette with mild cardiomegaly. No pneumothorax. Trace bilateral pleural effusions, similar. Mild pulmonary edema, slightly worsened. Left lung  base atelectasis, worsened. IMPRESSION: 1. Well-positioned support structures. 2. Mild congestive heart failure, slightly worsened. 3. Trace  bilateral pleural effusions, similar. 4. Worsened left lung base atelectasis. Electronically Signed   By: Ilona Sorrel M.D.   On: 11/07/2019 06:52   DG CHEST PORT 1 VIEW  Result Date: 10/26/2019 CLINICAL DATA:  Chest pain and weakness EXAM: PORTABLE CHEST 1 VIEW COMPARISON:  11/03/2019 FINDINGS: Single frontal view of the chest demonstrates a stable cardiac silhouette. Postsurgical changes from median sternotomy. Small bilateral pleural effusions are again identified. No focal lung consolidation. Central vascular prominence unchanged. No pneumothorax. IMPRESSION: 1. Persistent central vascular prominence with small bilateral pleural effusions. No significant change since prior study. Electronically Signed   By: Randa Ngo M.D.   On: 10/12/2019 19:42   Medications: . sodium chloride 10 mL/hr at 11/07/19 1000  . ampicillin-sulbactam (UNASYN) IV Stopped (11/07/19 0946)  . dextrose 10 mL/hr at 11/07/19 1000  . fentaNYL infusion INTRAVENOUS 50 mcg/hr (11/07/19 1000)  . heparin 1,600 Units/hr (11/07/19 1000)  . norepinephrine (LEVOPHED) Adult infusion Stopped (11/06/19 1623)   . sodium chloride   Intravenous Once  . chlorhexidine gluconate (MEDLINE KIT)  15 mL Mouth Rinse BID  . Chlorhexidine Gluconate Cloth  6 each Topical Daily  . clopidogrel  75 mg Per Tube Daily  . docusate  100 mg Per Tube BID  . ezetimibe  5 mg Per Tube QHS  . ferrous sulfate  300 mg Per Tube Q M,W,F  . finasteride  5 mg Oral QHS  . fluticasone  2 spray Each Nare Daily  . insulin aspart  0-15 Units Subcutaneous Q4H  . insulin detemir  25 Units Subcutaneous BID  . levothyroxine  150 mcg Per Tube QAC breakfast  . mouth rinse  15 mL Mouth Rinse 10 times per day  . melatonin  3 mg Per Tube QHS  . midodrine  10 mg Per Tube TID WC  . nortriptyline  50 mg Per Tube QHS  . pantoprazole  sodium  40 mg Per Tube BID  . polyethylene glycol  17 g Per Tube Daily  . senna  2 tablet Per Tube q AM   And  . senna  1 tablet Per Tube QHS  . sertraline  25 mg Per Tube [id number]  . sodium bicarbonate  50 mEq Intravenous Once  . sodium chloride flush  3 mL Intravenous Q12H  . traZODone  100 mg Per Tube QHS    Assessment/Plan **AKI on CKD 4:   Baseline creatinine 2-2.2.  solitary kidney.  Had bleeding with some degree of shock likely causing ATN however work-up has been reassuring and no other obvious signs of insult.  Renal function was improving but now worsened in the setting of cardiac arrest 5/28.  Fortunately creatinine is now improving today and urine output has been good with IV Lasix twice daily.  No indication for dialysis at this time but the patient has expressed that he would want this as a last resort.  Will order 60 mg IV Lasix x1 today.  Can redose if needed  **Ventricular fibrillation: Possibly secondary to cardiac ischemia or hypoxia.  Primary team and cardiology managing.  We will continue to follow  **Left atrial thrombus: Heparin drip and cardiology following  **Shock: History of such without any obvious infection.  He has been on midodrine with some improvement.  Now with worsening shock related to ventricular fibrillation and cardiac arrest.  Fortunately now off norepinephrine.  Continue midodrine.  **NSTEMI, h/o CAD with h/o PCI:  Cardiology following, holding on LHC for now in light of GIB and AKI.  No plans for LHC in light of high risk for CIN, dialysis.    **Anemia: acute blood loss s/p resection of gastric polyps.  Transfusion per primary.   **Hypokalemia: History of hyperkalemia requiring medical therapy now with potassium of 2.9 overnight.  Repleted with oral and IV potassium.  We will continue to monitor.  **Atrial fib: per cardiology.   **Hyponatremia: Sodium 134 today significantly improved likely related to free water retention in the setting of  heart failure.  No changes in treatment.

## 2019-11-07 NOTE — Progress Notes (Signed)
Reason for consult: Acute encephalopathy  Subjective: Patient is alert, remains intubated due to cardiac status.  He is following commands, engages examiner.  Appears to understand conversation.   ROS: negative except above   Examination  Vital signs in last 24 hours: Temp:  [96.6 F (35.9 C)-98.9 F (37.2 C)] 97.4 F (36.3 C) (05/29 0753) Pulse Rate:  [46-109] 87 (05/29 0700) Resp:  [16-20] 17 (05/29 0700) BP: (81-117)/(55-100) 91/65 (05/29 0700) SpO2:  [95 %-100 %] 98 % (05/29 0700) FiO2 (%):  [30 %-40 %] 40 % (05/29 1142) Weight:  [124.8 kg] 124.8 kg (05/29 0446)  General: lying in bed CVS: pulse-normal rate and rhythm RS: breathing comfortably with assistance of ventilator Extremities: normal   Neuro: MS: Alert, oriented, follows commands CN: pupils equal and reactive,  EOMI, face symmetric, tongue midline, normal sensation over face, Motor: 5/5 strength in all 4 extremities Coordination: Normal Gait: not tested  Basic Metabolic Panel: Recent Labs  Lab 11/04/2019 0725 11/01/2019 0725 11/06/19 0320 11/06/19 0320 11/06/19 0600 11/06/19 0824 11/06/19 1806 11/06/19 2327 11/07/19 0215  NA 135   < > 129*  --   --  130* 133* 133* 134*  K 3.4*   < > 4.7  --   --  4.8 3.4* 3.2* 2.9*  CL 98  --  93*  --   --  95*  --  97* 97*  CO2 24  --  20*  --   --  19*  --  17* 23  GLUCOSE 101*  --  201*  --   --  207*  --  82 100*  BUN 43*  --  54*  --   --  59*  --  63* 61*  CREATININE 3.57*  --  3.85*  --   --  4.16*  --  3.81* 3.74*  CALCIUM 8.2*   < > 7.8*   < >  --  7.7*  --  7.8* 7.8*  MG 2.6*  --   --   --  2.8* 2.5*  --  2.5*  --   PHOS 5.1*  --  5.9*  --   --   --   --   --   --    < > = values in this interval not displayed.    CBC: Recent Labs  Lab 11/03/19 1430 11/03/19 1939 11/04/19 0037 11/04/19 0037 11/04/19 0343 11/04/19 0343 10/21/2019 0725 11/06/19 0320 11/06/19 0824 11/06/19 1806 11/07/19 0215  WBC 12.5*   < > 15.5*   < > 14.5*  --  10.1 16.8*  16.4*  --  9.7  NEUTROABS 9.0*  --  12.3*  --  12.0*  --  7.4 12.9*  --   --   --   HGB 8.8*   < > 10.2*   < > 9.5*   < > 8.6* 9.5* 9.1* 9.2* 9.3*  HCT 29.6*   < > 34.2*   < > 32.1*   < > 28.4* 30.0* 29.6* 27.0* 29.0*  MCV 97.4   < > 98.6   < > 98.5  --  96.3 97.4 96.4  --  94.8  PLT 303   < > 365   < > 361  --  263 330 307  --  235   < > = values in this interval not displayed.     Coagulation Studies: No results for input(s): LABPROT, INR in the last 72 hours.  Imaging Reviewed:     ASSESSMENT AND  PLAN  74 year old male with CAD, CABG, CKD with AKI admitted for chest pain and upper GI bleed. Neurology was consulted on 5/25 after code stroke was called due to question of aphasia/confusion-felt to be related to pain medications. Patient was doing better the following day, however intermittently will have agitation. MRI brain has been ordered was pending to rule out acute infarct. Yesterday patient had Vfib arrest       -CPR performed and patient intubated. Echo showed left atrial appendage thrombus and patient started on heparin drip.  Patient is alert, appears to understand conversation and following commands today.  Movest all 4  extremities with good strength.  Appears to be annoyed he is intubated.   Acute toxic metabolic encephalopathy Delirium Acute kidney injury Possible acute stroke V. fib arrest Left atrial appendage thrombus GI bleeding  Recommendations MRI brain without contrast : No evidence of acute stroke or anoxic brain injury Agree with heparin drip-risk of future embolic strokes high with left atrial thrombus, will need long-term anticoagulation for stroke prevention  Frequent neurochecks Will need PT OT after extubation  Neurology will be available as needed.    Karena Addison Marnesha Gagen [facility] Pager Number [phone] For questions after 7pm please refer to AMION to reach the Neurologist on call

## 2019-11-07 NOTE — Progress Notes (Signed)
ANTICOAGULATION CONSULT NOTE - Follow Up Consult  Pharmacy Consult for heparin Indication: Afib/ACS in setting of possible CVA  Labs: Recent Labs    10/17/2019 1638 11/06/19 0320 11/06/19 0320 11/06/19 1610 11/06/19 0824 11/06/19 0827 11/06/19 1057 11/06/19 1637 11/06/19 1755 11/06/19 1806 11/06/19 2315 11/06/19 2327 11/07/19 0215  HGB  --  9.5*   < > 9.1*   < >  --   --   --   --  9.2*  --   --  9.3*  HCT  --  30.0*   < > 29.6*  --   --   --   --   --  27.0*  --   --  29.0*  PLT  --  330  --  307  --   --   --   --   --   --   --   --  235  HEPARINUNFRC  --   --   --   --   --   --   --  0.15*  --   --   --   --  0.12*  CREATININE  --  3.85*  --  4.16*  --   --   --   --   --   --   --  3.81*  --   CKTOTAL  --   --   --   --   --  171  --   --   --   --   --   --   --   CKMB  --   --   --   --   --  9.7*  --   --   --   --   --   --   --   TROPONINIHS   < >  --   --   --   --  2,001*   < > 4,199* 3,965*  --  5,520*  --   --    < > = values in this interval not displayed.    Assessment: 73yo male subtherapeutic on heparin after rate change; no signs of bleeding per RN though there were some brief infusion pauses when pt lost IV access.  Goal of Therapy:  Heparin level 0.3-0.5 units/ml   Plan:  Will increase heparin gtt by 1-2 units/kg/hr to 1600 units/hr and check level in 8 hours.    Wynona Neat, PharmD, BCPS  11/07/2019,2:52 AM

## 2019-11-07 NOTE — Progress Notes (Signed)
Sister updated via phone on patients condition. All questions answered.

## 2019-11-07 NOTE — Progress Notes (Signed)
Telephone Note I have updated patient's wife Arlene in full by phone. We discussed the results of the MRI and the plan to continue to diurese, monitor labs with the goal of SBT and eventual extubation. She verbalized understanding.I have told her to call the nursing unit for further updates tonight, and that we would talk with her again tomorrow unless there was a significant change.  Magdalen Spatz, MSN, AGACNP-BC [place] for personal pager PCCM on call pager [phone]  11/07/2019 4:06 PM

## 2019-11-07 NOTE — Progress Notes (Signed)
Wife updated via phone.  All questions answered.

## 2019-11-07 NOTE — Progress Notes (Signed)
DAILY PROGRESS NOTE   Patient Name: Matthew [name] Date of Encounter: 11/07/2019 Cardiologist: Larae Grooms, MD  Chief Complaint   Intubated, sedated  Patient Profile   Matthew [name] is a 74 y.o. male with a hx of coronary artery disease, CABG, hypertension, hyperlipidemia, paroxysmal atrial fibrillation, hypothyroidism, CKD who presents with chest pain and was found to have a GI bleed and mildly elevated troponins .  Who is being seen today for the evaluation of  Elevated troponins  at the request of  Dr. Tamala Julian.  Subjective   VF arrest yesterday -see Dr. Kyla Balzarine beside TEE - LVEF is now 35-40% with diffuse HK, worse inferiorly. Not a cath candidate at this point. Also found to have LAA Thrombus -started on IV heparin. Remains hypotensive on pressors. Rhythm shows afib with RVR - rates between 100-120.  Objective   Vitals:   11/07/19 0634 11/07/19 0648 11/07/19 0700 11/07/19 0753  BP: 97/64 95/67 91/65    Pulse: 82 82 87   Resp: 16 18 17    Temp:    (!) 97.4 F (36.3 C)  TempSrc:    Axillary  SpO2: 99% 98% 98%   Weight:      Height:        Intake/Output Summary (Last 24 hours) at 11/07/2019 1219 Last data filed at 11/07/2019 1000 Gross per 24 hour  Intake 1414.4 ml  Output 2250 ml  Net -835.6 ml   Filed Weights   11/06/19 0427 11/06/19 0800 11/07/19 0446  Weight: 131.7 kg 131.7 kg 124.8 kg    Physical Exam   General appearance: intubated, sedated on vent, mitts in place Neck: no carotid bruit, no JVD and thyroid not enlarged, symmetric, no tenderness/mass/nodules Lungs: diminished breath sounds bilaterally Heart: irregularly irregular rhythm Abdomen: soft, non-tender; bowel sounds normal; no masses,  no organomegaly Extremities: extremities normal, atraumatic, no cyanosis or edema Pulses: 2+ and symmetric Skin: cool, dry Neurologic: Mental status: sedated on vent Psych: Cannot assess  Inpatient Medications    Scheduled Meds: . sodium chloride    Intravenous Once  . chlorhexidine gluconate (MEDLINE KIT)  15 mL Mouth Rinse BID  . Chlorhexidine Gluconate Cloth  6 each Topical Daily  . clopidogrel  75 mg Per Tube Daily  . docusate  100 mg Per Tube BID  . ezetimibe  5 mg Per Tube QHS  . ferrous sulfate  300 mg Per Tube Q M,W,F  . finasteride  5 mg Oral QHS  . fluticasone  2 spray Each Nare Daily  . furosemide  60 mg Intravenous Once  . insulin aspart  0-15 Units Subcutaneous Q4H  . insulin detemir  25 Units Subcutaneous BID  . levothyroxine  150 mcg Per Tube QAC breakfast  . mouth rinse  15 mL Mouth Rinse 10 times per day  . melatonin  3 mg Per Tube QHS  . midodrine  10 mg Per Tube TID WC  . nortriptyline  50 mg Per Tube QHS  . pantoprazole sodium  40 mg Per Tube BID  . polyethylene glycol  17 g Per Tube Daily  . potassium chloride  20 mEq Oral Once  . senna  2 tablet Per Tube q AM   And  . senna  1 tablet Per Tube QHS  . sertraline  25 mg Per Tube [id number]  . sodium bicarbonate  50 mEq Intravenous Once  . sodium chloride flush  3 mL Intravenous Q12H  . traZODone  100 mg Per Tube QHS    Continuous  Infusions: . sodium chloride 10 mL/hr at 11/07/19 1000  . ampicillin-sulbactam (UNASYN) IV Stopped (11/07/19 0946)  . dextrose 10 mL/hr at 11/07/19 1000  . fentaNYL infusion INTRAVENOUS 50 mcg/hr (11/07/19 1000)  . heparin 1,600 Units/hr (11/07/19 1000)  . norepinephrine (LEVOPHED) Adult infusion Stopped (11/06/19 1623)    PRN Meds: acetaminophen, alum & mag hydroxide-simeth **AND** lidocaine, fentaNYL, fentaNYL (SUBLIMAZE) injection, HYDROmorphone (DILAUDID) injection, lidocaine, midazolam, midazolam, nitroGLYCERIN, OLANZapine zydis, polyethylene glycol, simethicone, sodium chloride, triamcinolone cream   Labs   Results for orders placed or performed during the hospital encounter of 10/28/2019 (from the past 48 hour(s))  Troponin I (High Sensitivity)     Status: Abnormal   Collection Time: 11/06/2019  3:01 PM  Result Value Ref  Range   Troponin I (High Sensitivity) 2,376 (HH) <18 ng/L    Comment: CRITICAL VALUE NOTED.  VALUE IS CONSISTENT WITH PREVIOUSLY REPORTED AND CALLED VALUE. (NOTE) Elevated high sensitivity troponin I (hsTnI) values and significant  changes across serial measurements may suggest ACS but many other  chronic and acute conditions are known to elevate hsTnI results.  Refer to the Links section for chest pain algorithms and additional  guidance. Performed at [facility], [place] [address], [place], [place] [address]   Glucose, capillary     Status: Abnormal   Collection Time: 11/01/2019  4:22 PM  Result Value Ref Range   Glucose-Capillary 133 (H) 70 - 99 mg/dL    Comment: Glucose reference range applies only to samples taken after fasting for at least 8 hours.  Troponin I (High Sensitivity)     Status: Abnormal   Collection Time: 11/09/2019  4:38 PM  Result Value Ref Range   Troponin I (High Sensitivity) 2,472 (HH) <18 ng/L    Comment: CRITICAL VALUE NOTED.  VALUE IS CONSISTENT WITH PREVIOUSLY REPORTED AND CALLED VALUE. (NOTE) Elevated high sensitivity troponin I (hsTnI) values and significant  changes across serial measurements may suggest ACS but many other  chronic and acute conditions are known to elevate hsTnI results.  Refer to the Links section for chest pain algorithms and additional  guidance. Performed at [facility], [place] [address], [place], [place] [address]   Glucose, capillary     Status: Abnormal   Collection Time: 10/18/2019  9:24 PM  Result Value Ref Range   Glucose-Capillary 216 (H) 70 - 99 mg/dL    Comment: Glucose reference range applies only to samples taken after fasting for at least 8 hours.  Glucose, capillary     Status: Abnormal   Collection Time: 11/06/19  1:57 AM  Result Value Ref Range   Glucose-Capillary 215 (H) 70 - 99 mg/dL    Comment: Glucose reference range applies only to samples taken after fasting for at least 8 hours.  Renal function panel      Status: Abnormal   Collection Time: 11/06/19  3:20 AM  Result Value Ref Range   Sodium 129 (L) 135 - 145 mmol/L   Potassium 4.7 3.5 - 5.1 mmol/L   Chloride 93 (L) 98 - 111 mmol/L   CO2 20 (L) 22 - 32 mmol/L   Glucose, Bld 201 (H) 70 - 99 mg/dL    Comment: Glucose reference range applies only to samples taken after fasting for at least 8 hours.   BUN 54 (H) 8 - 23 mg/dL   Creatinine, Ser 3.85 (H) 0.61 - 1.24 mg/dL   Calcium 7.8 (L) 8.9 - 10.3 mg/dL   Phosphorus 5.9 (H) 2.5 -  4.6 mg/dL   Albumin 3.0 (L) 3.5 - 5.0 g/dL   GFR calc non Af Amer 14 (L) >60 mL/min   GFR calc Af Amer 17 (L) >60 mL/min   Anion gap 16 (H) 5 - 15    Comment: Performed at [facility] [address], [place], [place] [address]  CBC with Differential/Platelet     Status: Abnormal   Collection Time: 11/06/19  3:20 AM  Result Value Ref Range   WBC 16.8 (H) 4.0 - 10.5 K/uL   RBC 3.08 (L) 4.22 - 5.81 MIL/uL   Hemoglobin 9.5 (L) 13.0 - 17.0 g/dL   HCT 30.0 (L) 39.0 - 52.0 %   MCV 97.4 80.0 - 100.0 fL   MCH 30.8 26.0 - 34.0 pg   MCHC 31.7 30.0 - 36.0 g/dL   RDW 17.2 (H) 11.5 - 15.5 %   Platelets 330 150 - 400 K/uL   nRBC 0.8 (H) 0.0 - 0.2 %   Neutrophils Relative % 76 %   Neutro Abs 12.9 (H) 1.7 - 7.7 K/uL   Lymphocytes Relative 10 %   Lymphs Abs 1.6 0.7 - 4.0 K/uL   Monocytes Relative 10 %   Monocytes Absolute 1.7 (H) 0.1 - 1.0 K/uL   Eosinophils Relative 1 %   Eosinophils Absolute 0.1 0.0 - 0.5 K/uL   Basophils Relative 1 %   Basophils Absolute 0.1 0.0 - 0.1 K/uL   Immature Granulocytes 2 %   Abs Immature Granulocytes 0.29 (H) 0.00 - 0.07 K/uL    Comment: Performed at [facility] [address], [place], [place] [address]  Blood gas, arterial     Status: Abnormal   Collection Time: 11/06/19  3:26 AM  Result Value Ref Range   FIO2 40.00    pH, Arterial 7.395 7.350 - 7.450   pCO2 arterial 29.4 (L) 32.0 - 48.0 mmHg   pO2, Arterial 96.4 83.0 - 108.0 mmHg   Bicarbonate 17.7 (L) 20.0 -  28.0 mmol/L   Acid-base deficit 6.3 (H) 0.0 - 2.0 mmol/L   O2 Saturation 96.0 %   Patient temperature 36.6    Collection site RIGHT RADIAL    Drawn by [phone]    Sample type ARTERIAL    Allens test (pass/fail) PASS PASS    Comment: Performed at [facility], [place] [address], [place], [place] [address]  Procalcitonin - Baseline     Status: None   Collection Time: 11/06/19  6:00 AM  Result Value Ref Range   Procalcitonin 0.49 ng/mL    Comment: SLIGHT HEMOLYSIS Interpretation: PCT (Procalcitonin) <= 0.5 ng/mL: Systemic infection (sepsis) is not likely. Local bacterial infection is possible. (NOTE)       Sepsis PCT Algorithm           Lower Respiratory Tract                                      Infection PCT Algorithm    ----------------------------     ----------------------------         PCT < 0.25 ng/mL                PCT < 0.10 ng/mL         Strongly encourage             Strongly discourage   discontinuation of antibiotics    initiation of antibiotics    ----------------------------     -----------------------------  PCT 0.25 - 0.50 ng/mL            PCT 0.10 - 0.25 ng/mL               OR       >80% decrease in PCT            Discourage initiation of                                            antibiotics      Encourage discontinuation           of antibiotics    ----------------------------     -----------------------------         PCT >= 0.50 ng/mL              PCT 0.26 - 0.50 ng/mL                AND       <80% decrease in PCT             Encourage initiation of                                             antibiotics       Encourage continuation           of antibiotics    ----------------------------     -----------------------------        PCT >= 0.50 ng/mL                  PCT > 0.50 ng/mL               AND         increase in PCT                  Strongly encourage                                      initiation of antibiotics    Strongly encourage  escalation           of antibiotics                                     -----------------------------                                           PCT <= 0.25 ng/mL                                                 OR                                        > 80% decrease in PCT  Discontinue / Do not initiate                                             antibiotics Performed at [facility], [place] [address] [place], [place] [address]   Magnesium     Status: Abnormal   Collection Time: 11/06/19  6:00 AM  Result Value Ref Range   Magnesium 2.8 (H) 1.7 - 2.4 mg/dL    Comment: Performed at [facility] [address], [place], [place] [address]  Brain natriuretic peptide     Status: Abnormal   Collection Time: 11/06/19  6:30 AM  Result Value Ref Range   B Natriuretic Peptide 942.9 (H) 0.0 - 100.0 pg/mL    Comment: Performed at [facility] [address] [place], [place] [address]  Glucose, capillary     Status: Abnormal   Collection Time: 11/06/19  6:57 AM  Result Value Ref Range   Glucose-Capillary 165 (H) 70 - 99 mg/dL    Comment: Glucose reference range applies only to samples taken after fasting for at least 8 hours.  Lactic acid, plasma     Status: Abnormal   Collection Time: 11/06/19  7:06 AM  Result Value Ref Range   Lactic Acid, Venous 4.5 (HH) 0.5 - 1.9 mmol/L    Comment: CRITICAL VALUE NOTED.  VALUE IS CONSISTENT WITH PREVIOUSLY REPORTED AND CALLED VALUE. Performed at [facility], [place] [address], [place], [place] [address]   Glucose, capillary     Status: Abnormal   Collection Time: 11/06/19  7:36 AM  Result Value Ref Range   Glucose-Capillary 192 (H) 70 - 99 mg/dL    Comment: Glucose reference range applies only to samples taken after fasting for at least 8 hours.  Comprehensive metabolic panel     Status: Abnormal   Collection Time: 11/06/19  8:24 AM  Result Value Ref Range   Sodium 130 (L) 135 - 145 mmol/L    Potassium 4.8 3.5 - 5.1 mmol/L   Chloride 95 (L) 98 - 111 mmol/L   CO2 19 (L) 22 - 32 mmol/L   Glucose, Bld 207 (H) 70 - 99 mg/dL    Comment: Glucose reference range applies only to samples taken after fasting for at least 8 hours.   BUN 59 (H) 8 - 23 mg/dL   Creatinine, Ser 4.16 (H) 0.61 - 1.24 mg/dL   Calcium 7.7 (L) 8.9 - 10.3 mg/dL   Total Protein 6.3 (L) 6.5 - 8.1 g/dL   Albumin 3.0 (L) 3.5 - 5.0 g/dL   AST 536 (H) 15 - 41 U/L   ALT 1,035 (H) 0 - 44 U/L   Alkaline Phosphatase 38 38 - 126 U/L   Total Bilirubin 0.6 0.3 - 1.2 mg/dL   GFR calc non Af Amer 13 (L) >60 mL/min   GFR calc Af Amer 15 (L) >60 mL/min   Anion gap 16 (H) 5 - 15    Comment: Performed at [facility], [place] [address], [place], [place] [address]  Magnesium     Status: Abnormal   Collection Time: 11/06/19  8:24 AM  Result Value Ref Range   Magnesium 2.5 (H) 1.7 - 2.4 mg/dL    Comment: Performed at [facility] [address], [place], [place] [address]  CBC     Status: Abnormal  Collection Time: 11/06/19  8:24 AM  Result Value Ref Range   WBC 16.4 (H) 4.0 - 10.5 K/uL   RBC 3.07 (L) 4.22 - 5.81 MIL/uL   Hemoglobin 9.1 (L) 13.0 - 17.0 g/dL   HCT 29.6 (L) 39.0 - 52.0 %   MCV 96.4 80.0 - 100.0 fL   MCH 29.6 26.0 - 34.0 pg   MCHC 30.7 30.0 - 36.0 g/dL   RDW 17.3 (H) 11.5 - 15.5 %   Platelets 307 150 - 400 K/uL   nRBC 0.6 (H) 0.0 - 0.2 %    Comment: Performed at [facility] [address] [place], [place] [address]  Calcium, ionized     Status: Abnormal   Collection Time: 11/06/19  8:24 AM  Result Value Ref Range   Calcium, Ionized, Serum 4.1 (L) 4.5 - 5.6 mg/dL    Comment: (NOTE) Performed At: [facility] [place], [place] [id number] Rush Farmer MD SE:[id number]   Culture, blood (routine x 2)     Status: None (Preliminary result)   Collection Time: 11/06/19  8:24 AM   Specimen: BLOOD LEFT HAND  Result Value Ref Range   Specimen Description BLOOD LEFT  HAND    Special Requests      BOTTLES DRAWN AEROBIC ONLY Blood Culture adequate volume   Culture      NO GROWTH < 24 HOURS Performed at [facility], [place] [address] [place], [place] [address]    Report Status PENDING   Culture, blood (routine x 2)     Status: None (Preliminary result)   Collection Time: 11/06/19  8:24 AM   Specimen: BLOOD RIGHT HAND  Result Value Ref Range   Specimen Description BLOOD RIGHT HAND    Special Requests      BOTTLES DRAWN AEROBIC ONLY Blood Culture adequate volume   Culture      NO GROWTH < 24 HOURS Performed at [facility], [place] [address], [place], [place] [address]    Report Status PENDING   Lactic acid, plasma     Status: Abnormal   Collection Time: 11/06/19  8:24 AM  Result Value Ref Range   Lactic Acid, Venous 4.1 (HH) 0.5 - 1.9 mmol/L    Comment: CRITICAL VALUE NOTED.  VALUE IS CONSISTENT WITH PREVIOUSLY REPORTED AND CALLED VALUE. Performed at [facility], [place] [address], [place], [place] [address]   Troponin I (High Sensitivity)     Status: Abnormal   Collection Time: 11/06/19  8:27 AM  Result Value Ref Range   Troponin I (High Sensitivity) 2,001 (HH) <18 ng/L    Comment: CRITICAL VALUE NOTED.  VALUE IS CONSISTENT WITH PREVIOUSLY REPORTED AND CALLED VALUE. (NOTE) Elevated high sensitivity troponin I (hsTnI) values and significant  changes across serial measurements may suggest ACS but many other  chronic and acute conditions are known to elevate hsTnI results.  Refer to the Links section for chest pain algorithms and additional  guidance. Performed at [facility], [place] [address], [place], [place] [address]   CK total and CKMB (cardiac)not at [facility]     Status: Abnormal   Collection Time: 11/06/19  8:27 AM  Result Value Ref Range   Total CK 171 49 - 397 U/L   CK, MB 9.7 (H) 0.5 - 5.0 ng/mL   Relative Index 5.7 (H) 0.0 - 2.5    Comment: Performed at [facility] [address], [place], [place] [address]  Troponin I (High Sensitivity)     Status: Abnormal   Collection Time: 11/06/19 10:57 AM  Result Value Ref Range   Troponin I (High Sensitivity) 2,391 (HH) <18 ng/L    Comment: CRITICAL VALUE NOTED.  VALUE IS CONSISTENT WITH PREVIOUSLY REPORTED AND CALLED VALUE. (NOTE) Elevated high sensitivity troponin I (hsTnI) values and significant  changes across serial measurements may suggest ACS but many other  chronic and acute conditions are known to elevate hsTnI results.  Refer to the Links section for chest pain algorithms and additional  guidance. Performed at [facility], [place] [address], [place], [place] [address]   Glucose, capillary     Status: Abnormal   Collection Time: 11/06/19 11:28 AM  Result Value Ref Range   Glucose-Capillary 162 (H) 70 - 99 mg/dL    Comment: Glucose reference range applies only to samples taken after fasting for at least 8 hours.  Glucose, capillary     Status: Abnormal   Collection Time: 11/06/19  3:36 PM  Result Value Ref Range   Glucose-Capillary 133 (H) 70 - 99 mg/dL    Comment: Glucose reference range applies only to samples taken after fasting for at least 8 hours.  Heparin level (unfractionated)     Status: Abnormal   Collection Time: 11/06/19  4:37 PM  Result Value Ref Range   Heparin Unfractionated 0.15 (L) 0.30 - 0.70 IU/mL    Comment: (NOTE) If heparin results are below expected values, and patient dosage has  been confirmed, suggest follow up testing of antithrombin III levels. Performed at [facility], [place] [address], [place], [place] [address]   Troponin I (High Sensitivity)     Status: Abnormal   Collection Time: 11/06/19  4:37 PM  Result Value Ref Range   Troponin I (High Sensitivity) 4,199 (HH) <18 ng/L    Comment: CRITICAL VALUE NOTED.  VALUE IS CONSISTENT WITH PREVIOUSLY REPORTED AND CALLED VALUE. (NOTE) Elevated high sensitivity troponin I (hsTnI) values and significant  changes across serial measurements may suggest  ACS but many other  chronic and acute conditions are known to elevate hsTnI results.  Refer to the Links section for chest pain algorithms and additional  guidance. Performed at [facility], [place] [address], [place], [place] [address]   Lactic acid, plasma     Status: Abnormal   Collection Time: 11/06/19  4:37 PM  Result Value Ref Range   Lactic Acid, Venous 2.4 (HH) 0.5 - 1.9 mmol/L    Comment: CRITICAL VALUE NOTED.  VALUE IS CONSISTENT WITH PREVIOUSLY REPORTED AND CALLED VALUE. Performed at [facility], [place] [address] [place], [place] [address]   Lactic acid, plasma     Status: Abnormal   Collection Time: 11/06/19  5:55 PM  Result Value Ref Range   Lactic Acid, Venous 2.5 (HH) 0.5 - 1.9 mmol/L    Comment: CRITICAL VALUE NOTED.  VALUE IS CONSISTENT WITH PREVIOUSLY REPORTED AND CALLED VALUE. Performed at [facility], [place] [address] [place], [place] [address]   Troponin I (High Sensitivity)     Status: Abnormal   Collection Time: 11/06/19  5:55 PM  Result Value Ref Range   Troponin I (High Sensitivity) 3,965 (HH) <18 ng/L    Comment: CRITICAL VALUE NOTED.  VALUE IS CONSISTENT WITH PREVIOUSLY REPORTED AND CALLED VALUE. (NOTE) Elevated high sensitivity troponin I (hsTnI) values and significant  changes across serial measurements may suggest ACS but many other  chronic and acute  conditions are known to elevate hsTnI results.  Refer to the Links section for chest pain algorithms and additional  guidance. Performed at [facility], [place] [address], [place], [place] [address]   I-STAT 7, (LYTES, BLD GAS, ICA, H+H)     Status: Abnormal   Collection Time: 11/06/19  6:06 PM  Result Value Ref Range   pH, Arterial 7.425 7.350 - 7.450   pCO2 arterial 35.2 32.0 - 48.0 mmHg   pO2, Arterial 40 (LL) 83.0 - 108.0 mmHg   Bicarbonate 23.1 20.0 - 28.0 mmol/L   TCO2 24 22 - 32 mmol/L   O2 Saturation 76.0 %   Acid-base deficit 1.0 0.0 - 2.0 mmol/L   Sodium 133 (L) 135  - 145 mmol/L   Potassium 3.4 (L) 3.5 - 5.1 mmol/L   Calcium, Ion 0.99 (L) 1.15 - 1.40 mmol/L   HCT 27.0 (L) 39.0 - 52.0 %   Hemoglobin 9.2 (L) 13.0 - 17.0 g/dL   Collection site Radial    Drawn by RT    Sample type ARTERIAL   Glucose, capillary     Status: None   Collection Time: 11/06/19  7:55 PM  Result Value Ref Range   Glucose-Capillary 73 70 - 99 mg/dL    Comment: Glucose reference range applies only to samples taken after fasting for at least 8 hours.  Glucose, capillary     Status: Abnormal   Collection Time: 11/06/19  8:39 PM  Result Value Ref Range   Glucose-Capillary 138 (H) 70 - 99 mg/dL    Comment: Glucose reference range applies only to samples taken after fasting for at least 8 hours.  Glucose, capillary     Status: None   Collection Time: 11/06/19 10:10 PM  Result Value Ref Range   Glucose-Capillary 83 70 - 99 mg/dL    Comment: Glucose reference range applies only to samples taken after fasting for at least 8 hours.  Lactic acid, plasma     Status: None   Collection Time: 11/06/19 11:15 PM  Result Value Ref Range   Lactic Acid, Venous 1.5 0.5 - 1.9 mmol/L    Comment: Performed at [facility] [address], [place], [place] [address]  Troponin I (High Sensitivity)     Status: Abnormal   Collection Time: 11/06/19 11:15 PM  Result Value Ref Range   Troponin I (High Sensitivity) 5,520 (HH) <18 ng/L    Comment: CRITICAL VALUE NOTED.  VALUE IS CONSISTENT WITH PREVIOUSLY REPORTED AND CALLED VALUE. (NOTE) Elevated high sensitivity troponin I (hsTnI) values and significant  changes across serial measurements may suggest ACS but many other  chronic and acute conditions are known to elevate hsTnI results.  Refer to the Links section for chest pain algorithms and additional  guidance. Performed at [facility], [place] [address] [place], [place] [address]   Glucose, capillary     Status: None   Collection Time: 11/06/19 11:26 PM  Result Value Ref Range    Glucose-Capillary 77 70 - 99 mg/dL    Comment: Glucose reference range applies only to samples taken after fasting for at least 8 hours.  Basic metabolic panel     Status: Abnormal   Collection Time: 11/06/19 11:27 PM  Result Value Ref Range   Sodium 133 (L) 135 - 145 mmol/L   Potassium 3.2 (L) 3.5 - 5.1 mmol/L   Chloride 97 (L) 98 - 111 mmol/L   CO2 17 (L) 22 - 32 mmol/L  Glucose, Bld 82 70 - 99 mg/dL    Comment: Glucose reference range applies only to samples taken after fasting for at least 8 hours.   BUN 63 (H) 8 - 23 mg/dL   Creatinine, Ser 3.81 (H) 0.61 - 1.24 mg/dL   Calcium 7.8 (L) 8.9 - 10.3 mg/dL   GFR calc non Af Amer 15 (L) >60 mL/min   GFR calc Af Amer 17 (L) >60 mL/min   Anion gap 19 (H) 5 - 15    Comment: Performed at [facility] [address], [place], [place] [address]  Magnesium     Status: Abnormal   Collection Time: 11/06/19 11:27 PM  Result Value Ref Range   Magnesium 2.5 (H) 1.7 - 2.4 mg/dL    Comment: Performed at [facility] [address] [place], [place] [address]  Glucose, capillary     Status: Abnormal   Collection Time: 11/07/19 12:50 AM  Result Value Ref Range   Glucose-Capillary 64 (L) 70 - 99 mg/dL    Comment: Glucose reference range applies only to samples taken after fasting for at least 8 hours.  Troponin I (High Sensitivity)     Status: Abnormal   Collection Time: 11/07/19 12:56 AM  Result Value Ref Range   Troponin I (High Sensitivity) 5,904 (HH) <18 ng/L    Comment: CRITICAL VALUE NOTED.  VALUE IS CONSISTENT WITH PREVIOUSLY REPORTED AND CALLED VALUE. (NOTE) Elevated high sensitivity troponin I (hsTnI) values and significant  changes across serial measurements may suggest ACS but many other  chronic and acute conditions are known to elevate hsTnI results.  Refer to the Links section for chest pain algorithms and additional  guidance. Performed at [facility], [place] [address], [place], [place] [address]   Glucose,  capillary     Status: Abnormal   Collection Time: 11/07/19  1:38 AM  Result Value Ref Range   Glucose-Capillary 110 (H) 70 - 99 mg/dL    Comment: Glucose reference range applies only to samples taken after fasting for at least 8 hours.  Procalcitonin     Status: None   Collection Time: 11/07/19  2:15 AM  Result Value Ref Range   Procalcitonin 0.73 ng/mL    Comment:        Interpretation: PCT > 0.5 ng/mL and <= 2 ng/mL: Systemic infection (sepsis) is possible, but other conditions are known to elevate PCT as well. (NOTE)       Sepsis PCT Algorithm           Lower Respiratory Tract                                      Infection PCT Algorithm    ----------------------------     ----------------------------         PCT < 0.25 ng/mL                PCT < 0.10 ng/mL         Strongly encourage             Strongly discourage   discontinuation of antibiotics    initiation of antibiotics    ----------------------------     -----------------------------       PCT 0.25 - 0.50 ng/mL            PCT 0.10 - 0.25 ng/mL  OR       >80% decrease in PCT            Discourage initiation of                                            antibiotics      Encourage discontinuation           of antibiotics    ----------------------------     -----------------------------         PCT >= 0.50 ng/mL              PCT 0.26 - 0.50 ng/mL                AND       <80% decrease in PCT             Encourage initiation of                                             antibiotics       Encourage continuation           of antibiotics    ----------------------------     -----------------------------        PCT >= 0.50 ng/mL                  PCT > 0.50 ng/mL               AND         increase in PCT                  Strongly encourage                                      initiation of antibiotics    Strongly encourage escalation           of antibiotics                                      -----------------------------                                           PCT <= 0.25 ng/mL                                                 OR                                        > 80% decrease in PCT                                     Discontinue / Do not initiate  antibiotics Performed at [facility], [place] [address] [place], [place] [address]   CBC     Status: Abnormal   Collection Time: 11/07/19  2:15 AM  Result Value Ref Range   WBC 9.7 4.0 - 10.5 K/uL   RBC 3.06 (L) 4.22 - 5.81 MIL/uL   Hemoglobin 9.3 (L) 13.0 - 17.0 g/dL   HCT 29.0 (L) 39.0 - 52.0 %   MCV 94.8 80.0 - 100.0 fL   MCH 30.4 26.0 - 34.0 pg   MCHC 32.1 30.0 - 36.0 g/dL   RDW 17.7 (H) 11.5 - 15.5 %   Platelets 235 150 - 400 K/uL   nRBC 1.0 (H) 0.0 - 0.2 %    Comment: Performed at [facility] [address], [place], [place] [address]  Comprehensive metabolic panel     Status: Abnormal   Collection Time: 11/07/19  2:15 AM  Result Value Ref Range   Sodium 134 (L) 135 - 145 mmol/L   Potassium 2.9 (L) 3.5 - 5.1 mmol/L   Chloride 97 (L) 98 - 111 mmol/L   CO2 23 22 - 32 mmol/L   Glucose, Bld 100 (H) 70 - 99 mg/dL    Comment: Glucose reference range applies only to samples taken after fasting for at least 8 hours.   BUN 61 (H) 8 - 23 mg/dL   Creatinine, Ser 3.74 (H) 0.61 - 1.24 mg/dL   Calcium 7.8 (L) 8.9 - 10.3 mg/dL   Total Protein 6.1 (L) 6.5 - 8.1 g/dL   Albumin 2.7 (L) 3.5 - 5.0 g/dL   AST 317 (H) 15 - 41 U/L   ALT 843 (H) 0 - 44 U/L   Alkaline Phosphatase 40 38 - 126 U/L   Total Bilirubin 0.9 0.3 - 1.2 mg/dL   GFR calc non Af Amer 15 (L) >60 mL/min   GFR calc Af Amer 17 (L) >60 mL/min   Anion gap 14 5 - 15    Comment: Performed at [facility] [address] [place], [place] [address]  Heparin level (unfractionated)     Status: Abnormal   Collection Time: 11/07/19  2:15 AM  Result Value Ref Range   Heparin Unfractionated 0.12 (L) 0.30 -  0.70 IU/mL    Comment: (NOTE) If heparin results are below expected values, and patient dosage has  been confirmed, suggest follow up testing of antithrombin III levels. Performed at [facility], [place] [address], [place], [place] [address]   Lactic acid, plasma     Status: None   Collection Time: 11/07/19  2:15 AM  Result Value Ref Range   Lactic Acid, Venous 1.5 0.5 - 1.9 mmol/L    Comment: Performed at [facility] [address] [place], [place] [address]  Glucose, capillary     Status: Abnormal   Collection Time: 11/07/19  3:47 AM  Result Value Ref Range   Glucose-Capillary 60 (L) 70 - 99 mg/dL    Comment: Glucose reference range applies only to samples taken after fasting for at least 8 hours.  Glucose, capillary     Status: Abnormal   Collection Time: 11/07/19  4:34 AM  Result Value Ref Range   Glucose-Capillary 127 (H) 70 - 99 mg/dL    Comment: Glucose reference range applies only to samples taken after fasting for at least 8 hours.  Glucose, capillary     Status: None   Collection Time: 11/07/19  7:59 AM  Result Value Ref  Range   Glucose-Capillary 76 70 - 99 mg/dL    Comment: Glucose reference range applies only to samples taken after fasting for at least 8 hours.  Heparin level (unfractionated)     Status: Abnormal   Collection Time: 11/07/19 10:57 AM  Result Value Ref Range   Heparin Unfractionated 0.29 (L) 0.30 - 0.70 IU/mL    Comment: (NOTE) If heparin results are below expected values, and patient dosage has  been confirmed, suggest follow up testing of antithrombin III levels. Performed at [facility], [place] [address] [place], [place] [address]   Glucose, capillary     Status: None   Collection Time: 11/07/19 11:55 AM  Result Value Ref Range   Glucose-Capillary 98 70 - 99 mg/dL    Comment: Glucose reference range applies only to samples taken after fasting for at least 8 hours.    ECG   N/A  Telemetry   Afib with RVR, occasional  PVC's - Personally Reviewed  Radiology    DG Chest 1 View  Result Date: 11/06/2019 CLINICAL DATA:  Increasing oxygen requirements. EXAM: CHEST  1 VIEW COMPARISON:  10/11/2019. FINDINGS: Endotracheal tube noted with tip 4 cm above the carina. NG tube noted with tip below left hemidiaphragm. Prior CABG. Cardiomegaly. Bilateral interstitial prominence. Tiny pleural effusions cannot be excluded. Findings suggest CHF. IMPRESSION: 1. Endotracheal tube noted with tip 4 cm above the carina. NG tube noted with tip below left hemidiaphragm. 2. Prior CABG. Cardiomegaly with bilateral interstitial prominence. Tiny bilateral pleural effusions cannot be excluded. Findings consistent with CHF. Electronically Signed   By: Marcello Moores  Register   On: 11/06/2019 08:08   MR BRAIN WO CONTRAST  Result Date: 11/07/2019 CLINICAL DATA:  Focal neuro deficit, > 6 hrs, stroke suspected concern for CVA EXAM: MRI HEAD WITHOUT CONTRAST TECHNIQUE: Multiplanar, multiecho pulse sequences of the brain and surrounding structures were obtained without intravenous contrast. COMPARISON:  None. FINDINGS: BRAIN: No acute infarct, acute hemorrhage or extra-axial collection. Early confluent hyperintense T2-weighted signal of the periventricular and deep white matter, most commonly due to chronic ischemic microangiopathy. There is generalized atrophy without lobar predilection. There are multiple chronic microhemorrhages in a predominantly central distribution. Normal midline structures. 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 VASCULAR: Major flow voids are preserved. SKULL AND UPPER CERVICAL SPINE: Normal calvarium and skull base. Visualized upper cervical spine and soft tissues are normal. SINUSES/ORBITS: No paranasal sinus fluid levels or advanced mucosal thickening. No mastoid or middle ear effusion. Normal orbits. IMPRESSION: 1. No acute intracranial abnormality. 2. Generalized atrophy and chronic ischemic microangiopathy. 3. Right basal  ganglia and left cerebellar microhemorrhages, unchanged. Electronically Signed   By: Ulyses Jarred M.D.   On: 11/07/2019 00:58   DG CHEST PORT 1 VIEW  Result Date: 11/07/2019 CLINICAL DATA:  Intubated EXAM: PORTABLE CHEST 1 VIEW COMPARISON:  Chest radiograph from one day prior. FINDINGS: Endotracheal tube tip is 3.3 cm above the carina. Enteric tube enters stomach with the tip not seen on this image. Stable cardiomediastinal silhouette with mild cardiomegaly. No pneumothorax. Trace bilateral pleural effusions, similar. Mild pulmonary edema, slightly worsened. Left lung base atelectasis, worsened. IMPRESSION: 1. Well-positioned support structures. 2. Mild congestive heart failure, slightly worsened. 3. Trace bilateral pleural effusions, similar. 4. Worsened left lung base atelectasis. Electronically Signed   By: Ilona Sorrel M.D.   On: 11/07/2019 06:52   DG CHEST PORT 1 VIEW  Result Date: 10/19/2019 CLINICAL DATA:  Chest pain and weakness EXAM: PORTABLE CHEST 1 VIEW COMPARISON:  11/03/2019 FINDINGS: Single frontal view of the chest demonstrates a stable cardiac silhouette. Postsurgical changes from median sternotomy. Small bilateral pleural effusions are again identified. No focal lung consolidation. Central vascular prominence unchanged. No pneumothorax. IMPRESSION: 1. Persistent central vascular prominence with small bilateral pleural effusions. No significant change since prior study. Electronically Signed   By: Randa Ngo M.D.   On: 10/28/2019 19:42    Cardiac Studies   TEE Bedside (11/06/2018)  Verbal consent from wife Sedation Fentanyl and Precedex  Mild LVE EF 35-40% more diffuse hypokinesis slightly worse in inferior wall Moderate MR Moderate TR Moderate RV hypokinesis with mild dilatation Normal aorta with no dissection and minimal plaque No ASD/PFO No effusion  Large amount of LAA thrombus   Continue supportive care Lasix for CHF Amiodarone if needed for recurrent  ventricular arrhythmia Start heparin for PAF and LAA thrombus  Vent management per CCM CXR does not look that bad in regard To CHF. ETT advance 4 cm before TEE  Wife has been updated at length  Jenkins Rouge MD [facility]  Assessment   1. Principal Problem: 2.   Chest pain 3. Active Problems: 4.   Hypothyroidism 5.   PAF (paroxysmal atrial fibrillation) ([place]) 6.   CKD (chronic kidney disease), stage III 7.   AAA (abdominal aortic aneurysm) ([place]) 8.   Diabetic polyneuropathy associated with type 2 diabetes mellitus ([place]) 9.   OSA (obstructive sleep apnea) 10.   Prolonged QT interval 11.   Depression 12.   GI bleed 13.   Acute blood loss anemia 14.   Hyperlipidemia LDL goal <70 15.   AKI (acute kidney injury) ([place]) 16.   Blood in stool 17.   Stage 4 chronic kidney disease ([place]) 18.   Acute renal failure superimposed on stage 4 chronic kidney disease ([place]) 19.   NSTEMI (non-ST elevated myocardial infarction) ([place]) 20.   Acute lower UTI 21.   Respiratory arrest ([place]) 22.   Plan   1. New systolic CHF (EF 23-95%) s/p VF arrest - now in afib with rates in 120's/130's - noted to have LAA thrombus on echo - IV heparin. Remains hypotensive on pressors. Recent GIB and AKI on CKD4 with solitary kidney preclude cath/intervention. Renal managing - creatinine improved somewhat today. Agree would consider amiodarone if recurrent episodes of VT or if there is persistent afib with RVR - difficult to control V-rates >140. BP will not allow HF meds. On midodrine. Plavix and heparin for CAD. Off statin d/t elevated liver enzymes - probably little benefit given advanced CKD - still on Zetia, would discontinue as well given elevated LFT's.  Time Spent Directly with Patient:  I have spent a total of 35 minutes with the patient reviewing hospital notes, telemetry, EKGs, labs and examining the patient as well as establishing an assessment and plan that was discussed personally with the patient.  > 50% of  time was spent in direct patient care.  Length of Stay:  LOS: 10 days   Pixie Casino, MD, [facility], [place] Director of the Advanced Lipid Disorders &  Cardiovascular Risk Reduction Clinic Diplomate of the American Board of Clinical Lipidology Attending Cardiologist  Direct Dial: [phone]  Fax: [phone]  Website:  [email or web address] [email or web address] Adric Wrede 11/07/2019, 12:19 PM

## 2019-11-07 NOTE — Progress Notes (Signed)
TRANSPORT PT TO MRI AND BACK WITH RN WITHOUT ANY COMPLICATIONS

## 2019-11-07 NOTE — Progress Notes (Signed)
[facility] for heparin Indication: chest pain/ACS and atrial fibrillation  Allergies  Allergen Reactions  . Amiodarone Other (See Comments)    Caused lung and liver toxicity  . Tetanus Toxoids Hives  . Morphine And Related Other (See Comments)    "made him crazy and heart was racing"    Patient Measurements: Height: 6' (182.9 cm) Weight: 124.8 kg (275 lb 2.2 oz) IBW/kg (Calculated) : 77.6 Heparin Dosing Weight: 107 kg  Vital Signs: Temp: 97.6 F (36.4 C) (05/29 1521) Temp Source: Axillary (05/29 1521) BP: 96/66 (05/29 1800) Pulse Rate: 68 (05/29 1800)  Labs: Recent Labs    11/08/2019 1638 11/06/19 0320 11/06/19 0320 11/06/19 0824 11/06/19 0824 11/06/19 0827 11/06/19 1057 11/06/19 1637 11/06/19 1755 11/06/19 1806 11/06/19 2315 11/06/19 2327 11/07/19 0056 11/07/19 0215 11/07/19 1057 11/07/19 1520 11/07/19 1816  HGB  --  9.5*   < > 9.1*   < >  --   --   --   --  9.2*  --   --   --  9.3*  --   --   --   HCT  --  30.0*   < > 29.6*  --   --   --   --   --  27.0*  --   --   --  29.0*  --   --   --   PLT  --  330  --  307  --   --   --   --   --   --   --   --   --  235  --   --   --   HEPARINUNFRC  --   --   --   --   --   --    < >   < >  --   --   --   --   --  0.12* 0.29*  --  0.41  CREATININE  --  3.85*   < > 4.16*   < >  --   --   --   --   --   --  3.81*  --  3.74*  --  3.43*  --   CKTOTAL  --   --   --   --   --  171  --   --   --   --   --   --   --   --   --   --   --   CKMB  --   --   --   --   --  9.7*  --   --   --   --   --   --   --   --   --   --   --   TROPONINIHS   < >  --   --   --   --  2,001*   < >  --  4,008*  --  5,520*  --  5,904*  --   --   --   --    < > = values in this interval not displayed.    Estimated Creatinine Clearance: 25.4 mL/min (A) (by C-G formula based on SCr of 3.43 mg/dL (H)).  Assessment: 74 yo m with complicated hospital course, now with VFib arrest this am. Has had a GI bleed, NSTEMI,  Code stroke called during stay.  Hx of apixaban PTA for afib  Had endoscopy on 5/24 with clipped  polyps Bedside TEE shows left atrial appendage clot MRI brain ruled out CVA will increase HL goal back to 0.3-0.7  Heparin level low at 0.41 on 1750 units/hr. No issues noted, no adjustment needed.   Goal of Therapy:  Heparin level 0.3-0.7 units/ml  Monitor platelets by anticoagulation protocol: Yes   Plan:  Continue heparin to 1750 units/hr Monitor daily heparin level and CBC, s/sx bleeding  Erin Hearing PharmD., BCPS Clinical Pharmacist 11/07/2019 7:03 PM

## 2019-11-07 NOTE — Consult Note (Addendum)
NAME:  Matthew [name], MRN:  [id number], DOB:  25-Feb-1946, LOS: 80 ADMISSION DATE:  11/09/2019, CONSULTATION DATE:  11/06/19 REFERRING MD:  [name] - TRH, CHIEF COMPLAINT:  VFib arrest  Brief History   74 yo M admitted with NSTEMI, sustained VFib cardiac arrest overnight (5/28), with ROSC after approx 5 minutes   History of present illness   74 yo M PMH CAD s/p CABGx5, pAFib on eliquis, AAA, CKD IV, HTN, HLD, cocaine abuse, chronic hypotension who presents 5/20 with L sided chest pain. Pain was sudden onset while watching a basketball game. Initially pain described as dull, radiating down L arm. Denied concomitant diaphoresis, n/v, SOB. Endorsed similar sx 3 days prior, with diaphoresis and nausea, but resolved after nitro. Admitted with NSTEMI. Prior [facility] 10/2018 with severe triple vessel CAD sp 5v CABG with 4/5 patent grafts and successful DES to mid body of SVF to OM2 OM3.  Hospital course complicated by worsening GIB, s/p endoscopy 5/24, with clips placed to oozing gastric polyps. Cardiology has deferred cardiac cath due to AKI on CKD, GIB for which eliquis has been held. On 5/25 neurology was consulted for aphagia with AMS. CT H without signs of acute CVA. Felt to be toxic metabolic encephalopathy.  5/27 nephrology consulted for progressive AKI on CKD with solitary kidney.  Hospital course marked by ongoing hypotensive episodes and ongoing complaints of chest pain.  5/28 pt sustained a VFib arrest. Transferred to ICU   Past [facility] Events   5/20 admitted with chest pain, GIB  On 5/25 neurology was consulted for aphagia with AMS. CT H without signs of acute CVA. Felt to be toxic metabolic encephalopathy.  5/27 nephrology consulted for progressive AKI on CKD with solitary kidney.  Hospital course marked by ongoing hypotensive episodes and ongoing complaints of chest pain.  5/28 pt sustained a VFib arrest. Transferred to ICU   Consults:   Cardiology Neurology Nephrology PCCM   Procedures:  5/24 endoscopy 5/28 ETT>>  Significant Diagnostic Tests:  CT a/p 5/26> small bilateral pleural effusions. Lower lobe atelectasis, possible PNA. R infra hepatic fat stranding. No extraluminal gas or bowel wall thickening. Some stranding around midline pancreas  EGD/ Colonoscopy: 5/24 Oozing polyps, both were resected and clipped>> Needs follow up colonoscopy for small pre-cancerous polyps  ECHO 5/25> LVEF 50-55%  Micro Data:  5/26 BCx> staph species x 1   Antimicrobials:  Cefepime, end 5/28 5/28 unasyn>>  Interim history/subjective:  CXR 5/29 Stable cardiomediastinal silhouette with mild cardiomegaly. No pneumothorax. Trace bilateral pleural effusions, similar. Mild pulmonary edema, slightly worsened. Left lung base atelectasis, Worsened. + 2.9 L  Afebrile, WBC 9 Empiric Unasyn started 5/28  Objective   Blood pressure 91/65, pulse 87, temperature (!) 97.4 F (36.3 C), temperature source Axillary, resp. rate 17, height 6' (1.829 m), weight 124.8 kg, SpO2 98 %.    Vent Mode: PRVC FiO2 (%):  [30 %-45 %] 40 % Set Rate:  [16 bmp-20 bmp] 16 bmp Vt Set:  [620 mL] 620 mL PEEP:  [5 cmH20] 5 cmH20 Plateau Pressure:  [20 cmH20-22 cmH20] 22 cmH20   Intake/Output Summary (Last 24 hours) at 11/07/2019 1008 Last data filed at 11/07/2019 0603 Gross per 24 hour  Intake 1302.32 ml  Output 2300 ml  Net -997.68 ml   Filed Weights   11/06/19 0427 11/06/19 0800 11/07/19 0446  Weight: 131.7 kg 131.7 kg 124.8 kg    Examination: General: Chronically and critically ill appearing older adult M,  intubated and sedated NAD , easily arousable HENT: NCAT pink mmm ETT secure. Trachea midline . OG tube Lungs: Bibasilar crackles. Diminished per bases, Symmetrical chest expansion. Mechanically ventilated Cardiovascular: irir s1s2 no rgm cap refill < 3 seconds Abdomen: soft round ndnt, BS diminished Extremities: Symmetrical bulk and tone. +  edema. No clubbing Neuro: Sedated, responds to light  stimulation,  following commands GU: Concentrated amber urine   Resolved Hospital Problem list     Assessment & Plan:   Acute respiratory failure with hypoxia in setting of cardiac arrest requiring intubation  Hx OSA Possible CAP on CT, at risk for aspiration PNA following arrest  P -Cont MV - No SBT for now -ABG prn - Trend CXR -PAD, VAP  -empiric unasyn  VFib arrest -unclear if arrest etiology possibly r/t documented respiratory complaints prior vs primary cardiac etiology Troponins are climbing Hx. QTc prolongation P -Trend  cmp mag phos, trops, ckmb - appreciate cards assist>  TEE -- visualized clot in R atria  -dc antihypertensives  -heparin gtt per pharmacy -not candidate for cath per cards 2/2 renal and GI issues -consider amio if recurrent ventricular arrhythmia - QTc bedside monitoring - Avoid QTc prolongation medications   Shock -hx chronic orthostatic hypotension  - initially in shock following cardiac arrest. Bedside TEE with LVEF 35-50%, more diffuse hypokinesis, LAA thrombus seen on TEE  Off pressors 5/29 am Elevated LFT's P -NE for MAP goal > 65 -supportive care  -continue midodrine  -heart failure engaged by cards--  - CMET with LFT's in am to trend  LAA thrombus -seen on bedside TEE by cards on 5/28 P -heparin gtt  NSTEMI CAD  Angina  P -cardiology following, not candidate for cath   Paroxysmal AF P -ICU monitoring  -heparin gtt  HTN HLD P -holding antihypertensives in setting of shock  AKI on CKD IV  -solitary kidney  -suspected AKI due to hypotension, now with ATN P -nephrology following, no indication for RRT yet  ABLA due to GI Bleed, s/p endoscopy with gastric polyp clipping GERD GI following P - trend CBC, goal hgb> 8  - Monitor for obvious bleeding  AAA -3 x 3.2cm aneurysm of distal abdominal aorta P -op follow up imaging  -will need cocaine cessation  counseling and compliance with BP medications   DM Hypoglycemic episodes, Currently on D10 P -SSI + basal - CBG's - Will add Trickle feeds  Hypothyroidism P -synthroid   Depression P -nortriptyline, zoloft, zyprexa, trazodone   Best practice:  Diet: NPO Pain/Anxiety/Delirium protocol (if indicated): Prn fent VAP protocol (if indicated): yes  DVT prophylaxis: heparin gtt GI prophylaxis: protonix  Glucose control: SSI + basal  Mobility: BR Code Status: Full  Family Communication: critical care communication pending 5/28.  Disposition: ICU   Critical care time:  48 min    Magdalen Spatz, MSN, AGACNP-BC [place] for personal pager and assignments PCCM on call pager [phone]  11/07/2019, 10:08 AM

## 2019-11-07 NOTE — Progress Notes (Signed)
[facility] for heparin Indication: chest pain/ACS and atrial fibrillation  Allergies  Allergen Reactions  . Amiodarone Other (See Comments)    Caused lung and liver toxicity  . Tetanus Toxoids Hives  . Morphine And Related Other (See Comments)    "made him crazy and heart was racing"    Patient Measurements: Height: 6' (182.9 cm) Weight: 124.8 kg (275 lb 2.2 oz) IBW/kg (Calculated) : 77.6 Heparin Dosing Weight: 107 kg  Vital Signs: Temp: 97.4 F (36.3 C) (05/29 0753) Temp Source: Axillary (05/29 0753) BP: 91/65 (05/29 0700) Pulse Rate: 87 (05/29 0700)  Labs: Recent Labs    11/06/2019 1638 11/06/19 0320 11/06/19 0320 11/06/19 0824 11/06/19 0824 11/06/19 0827 11/06/19 1057 11/06/19 1637 11/06/19 1637 11/06/19 1755 11/06/19 1806 11/06/19 2315 11/06/19 2327 11/07/19 0056 11/07/19 0215 11/07/19 1057  HGB  --  9.5*   < > 9.1*   < >  --   --   --   --   --  9.2*  --   --   --  9.3*  --   HCT  --  30.0*   < > 29.6*  --   --   --   --   --   --  27.0*  --   --   --  29.0*  --   PLT  --  330  --  307  --   --   --   --   --   --   --   --   --   --  235  --   HEPARINUNFRC  --   --   --   --   --   --   --  0.15*  --   --   --   --   --   --  0.12* 0.29*  CREATININE  --  3.85*   < > 4.16*  --   --   --   --   --   --   --   --  3.81*  --  3.74*  --   CKTOTAL  --   --   --   --   --  171  --   --   --   --   --   --   --   --   --   --   CKMB  --   --   --   --   --  9.7*  --   --   --   --   --   --   --   --   --   --   TROPONINIHS   < >  --   --   --   --  2,001*   < > 4,199*   < > 3,965*  --  5,520*  --  5,904*  --   --    < > = values in this interval not displayed.    Estimated Creatinine Clearance: 23.3 mL/min (A) (by C-G formula based on SCr of 3.74 mg/dL (H)).  Assessment: 74 yo m with complicated hospital course, now with VFib arrest this am. Has had a GI bleed, NSTEMI, Code stroke called during stay.  Hx of apixaban PTA  for afib  Had endoscopy on 5/24 with clipped polyps Bedside TEE shows left atrial appendage clot MRI brain ruled out CVA will increase HL goal back to 0.3-0.7  Heparin level low at 0.29 on 1600  units/hr, will increase his rate slightly   Goal of Therapy:  Heparin level 0.3-0.7 units/ml  Monitor platelets by anticoagulation protocol: Yes   Plan:  Increase heparin to 1750 units/hr 6h heparin level Monitor daily heparin level and CBC, s/sx bleeding   Nicoletta Dress, PharmD PGY2 Infectious Disease Pharmacy Resident  Clinical Pharmacist 11/07/2019 11:40 AM

## 2019-11-07 NOTE — Progress Notes (Signed)
[facility] Progress Note Patient Name: Matthew [name] DOB: 06-03-1946 MRN: [id number]   Date of Service  11/07/2019  HPI/Events of Note  Pt with rising Troponin .  eICU Interventions  Ideally Pt should be on Aspirin and a beta blocker but recent GI bleeding and hypotension are relative contraindications, Pt is being followed by cardiology hence will defer to them.        Matthew [name] Matthew [name] 11/07/2019, 2:11 AM

## 2019-11-07 NOTE — Progress Notes (Signed)
Wife updated on patients condition via phone. All questions answered. Wife very appreciative of care received.

## 2019-11-07 NOTE — Progress Notes (Signed)
Patient back from MRI. No issues with transfer.

## 2019-11-07 NOTE — Progress Notes (Signed)
[facility] Progress Note Patient Name: Matthew [name] DOB: 1946/05/29 MRN: [id number]   Date of Service  11/07/2019  HPI/Events of Note  K+ 2.9, Creatinine 3.7  eICU Interventions  KCL 20 meq via NG tube x1 + KCL 10 meq iv x 1 for a combined total of 30 meq.        Kerry Kass Adri Schloss 11/07/2019, 4:18 AM

## 2019-11-08 ENCOUNTER — Inpatient Hospital Stay (HOSPITAL_COMMUNITY): Payer: 59

## 2019-11-08 DIAGNOSIS — R9431 Abnormal electrocardiogram [ECG] [EKG]: Secondary | ICD-10-CM

## 2019-11-08 LAB — GLUCOSE, CAPILLARY
Glucose-Capillary: 148 mg/dL — ABNORMAL HIGH (ref 70–99)
Glucose-Capillary: 152 mg/dL — ABNORMAL HIGH (ref 70–99)
Glucose-Capillary: 153 mg/dL — ABNORMAL HIGH (ref 70–99)
Glucose-Capillary: 179 mg/dL — ABNORMAL HIGH (ref 70–99)
Glucose-Capillary: 203 mg/dL — ABNORMAL HIGH (ref 70–99)
Glucose-Capillary: 210 mg/dL — ABNORMAL HIGH (ref 70–99)

## 2019-11-08 LAB — BASIC METABOLIC PANEL
Anion gap: 16 — ABNORMAL HIGH (ref 5–15)
BUN: 59 mg/dL — ABNORMAL HIGH (ref 8–23)
CO2: 22 mmol/L (ref 22–32)
Calcium: 8 mg/dL — ABNORMAL LOW (ref 8.9–10.3)
Chloride: 97 mmol/L — ABNORMAL LOW (ref 98–111)
Creatinine, Ser: 3.29 mg/dL — ABNORMAL HIGH (ref 0.61–1.24)
GFR calc Af Amer: 20 mL/min — ABNORMAL LOW (ref >60)
GFR calc non Af Amer: 17 mL/min — ABNORMAL LOW (ref >60)
Glucose, Bld: 163 mg/dL — ABNORMAL HIGH (ref 70–99)
Potassium: 4 mmol/L (ref 3.5–5.1)
Sodium: 135 mmol/L (ref 135–145)

## 2019-11-08 LAB — CBC
HCT: 30.1 % — ABNORMAL LOW (ref 39.0–52.0)
Hemoglobin: 9.3 g/dL — ABNORMAL LOW (ref 13.0–17.0)
MCH: 30 pg (ref 26.0–34.0)
MCHC: 30.9 g/dL (ref 30.0–36.0)
MCV: 97.1 fL (ref 80.0–100.0)
Platelets: 227 10*3/uL (ref 150–400)
RBC: 3.1 MIL/uL — ABNORMAL LOW (ref 4.22–5.81)
RDW: 18.1 % — ABNORMAL HIGH (ref 11.5–15.5)
WBC: 11.3 10*3/uL — ABNORMAL HIGH (ref 4.0–10.5)
nRBC: 0.3 % — ABNORMAL HIGH (ref 0.0–0.2)

## 2019-11-08 LAB — MAGNESIUM
Magnesium: 2.2 mg/dL (ref 1.7–2.4)
Magnesium: 2.2 mg/dL (ref 1.7–2.4)

## 2019-11-08 LAB — PROCALCITONIN: Procalcitonin: 0.44 ng/mL

## 2019-11-08 LAB — BRAIN NATRIURETIC PEPTIDE: B Natriuretic Peptide: 1138.4 pg/mL — ABNORMAL HIGH (ref 0.0–100.0)

## 2019-11-08 LAB — PHOSPHORUS: Phosphorus: 5.1 mg/dL — ABNORMAL HIGH (ref 2.5–4.6)

## 2019-11-08 LAB — HEPARIN LEVEL (UNFRACTIONATED): Heparin Unfractionated: 0.45 IU/mL (ref 0.30–0.70)

## 2019-11-08 LAB — TROPONIN I (HIGH SENSITIVITY)
Troponin I (High Sensitivity): 11558 ng/L (ref <18)
Troponin I (High Sensitivity): 11652 ng/L (ref <18)

## 2019-11-08 MED ORDER — FUROSEMIDE 10 MG/ML IJ SOLN
60.0000 mg | Freq: Two times a day (BID) | INTRAMUSCULAR | Status: DC
  Administered 2019-11-08 – 2019-11-09 (×2): 60 mg via INTRAVENOUS
  Filled 2019-11-08 (×2): qty 6

## 2019-11-08 MED ORDER — NICOTINE 14 MG/24HR TD PT24: 14.0000 mg | MEDICATED_PATCH | Freq: Every day | TRANSDERMAL | Status: DC

## 2019-11-08 MED ORDER — POTASSIUM CHLORIDE 20 MEQ/15ML (10%) PO SOLN
40.0000 meq | Freq: Once | ORAL | Status: AC
  Administered 2019-11-08: 40 meq via ORAL
  Filled 2019-11-08: qty 30

## 2019-11-08 MED ORDER — PRO-STAT SUGAR FREE PO LIQD
30.0000 mL | Freq: Two times a day (BID) | ORAL | Status: DC
  Administered 2019-11-08 – 2019-11-09 (×3): 30 mL
  Filled 2019-11-08 (×3): qty 30

## 2019-11-08 MED ORDER — VITAL HIGH PROTEIN PO LIQD
1000.0000 mL | ORAL | Status: DC
  Administered 2019-11-09 – 2019-11-11 (×4): 1000 mL

## 2019-11-08 MED ORDER — METOPROLOL TARTRATE 25 MG/10 ML ORAL SUSPENSION
12.5000 mg | Freq: Two times a day (BID) | ORAL | Status: DC
  Administered 2019-11-08 – 2019-11-10 (×5): 12.5 mg
  Filled 2019-11-08 (×5): qty 10

## 2019-11-08 MED ORDER — FUROSEMIDE 10 MG/ML IJ SOLN
60.0000 mg | Freq: Once | INTRAMUSCULAR | Status: AC
  Administered 2019-11-08: 60 mg via INTRAVENOUS
  Filled 2019-11-08: qty 6

## 2019-11-08 NOTE — Progress Notes (Signed)
[facility] Progress Note   Subjective:   More awake and alert today.  Patient remains intubated.  Urine output continues to be good and creatinine is improving.  Objective Vitals:   11/08/19 0630 11/08/19 0645 11/08/19 0759 11/08/19 0800  BP: 97/66 105/70  (!) 89/73  Pulse: 73 74  73  Resp: 16 16  16   Temp:   97.7 F (36.5 C)   TempSrc:   Axillary   SpO2: 100% 100%  100%  Weight:      Height:       Physical Exam General: Intubated opens eyes to questioning Heart: Normal rate Lungs: Ventilated, clear to auscultation anteriorly Abdomen: soft, obese Extremities: 1+ edema in the bilateral lower extremity GU: foley draining clear yellow urine  Additional Objective Labs: Basic Metabolic Panel: Recent Labs  Lab 11/04/2019 0725 10/17/2019 0725 11/06/19 0320 11/06/19 0824 11/07/19 0215 11/07/19 1520 11/08/19 0154  NA 135   < > 129*   < > 134* 133* 135  K 3.4*   < > 4.7   < > 2.9* 4.5 4.0  CL 98   < > 93*   < > 97* 98 97*  CO2 24   < > 20*   < > 23 21* 22  GLUCOSE 101*   < > 201*   < > 100* 120* 163*  BUN 43*   < > 54*   < > 61* 62* 59*  CREATININE 3.57*   < > 3.85*   < > 3.74* 3.43* 3.29*  CALCIUM 8.2*   < > 7.8*   < > 7.8* 7.7* 8.0*  PHOS 5.1*  --  5.9*  --   --   --   --    < > = values in this interval not displayed.   Liver Function Tests: Recent Labs  Lab 11/04/19 0037 10/23/2019 0725 11/06/19 0320 11/06/19 0824 11/07/19 0215  AST 72*  --   --  536* 317*  ALT 59*  --   --  1,035* 843*  ALKPHOS 36*  --   --  38 40  BILITOT 0.8  --   --  0.6 0.9  PROT 7.0  --   --  6.3* 6.1*  ALBUMIN 3.2*   < > 3.0* 3.0* 2.7*   < > = values in this interval not displayed.   Recent Labs  Lab 11/04/2019 1015  LIPASE 14   CBC: Recent Labs  Lab 11/04/19 0343 11/04/19 0343 10/16/2019 0725 10/18/2019 0725 11/06/19 0320 11/06/19 0320 11/06/19 0824 11/06/19 0824 11/06/19 1806 11/07/19 0215 11/08/19 0154  WBC 14.5*   < > 10.1   < > 16.8*   < > 16.4*  --   --   9.7 11.3*  NEUTROABS 12.0*  --  7.4  --  12.9*  --   --   --   --   --   --   HGB 9.5*   < > 8.6*   < > 9.5*   < > 9.1*   < > 9.2* 9.3* 9.3*  HCT 32.1*   < > 28.4*   < > 30.0*   < > 29.6*   < > 27.0* 29.0* 30.1*  MCV 98.5   < > 96.3  --  97.4  --  96.4  --   --  94.8 97.1  PLT 361   < > 263   < > 330   < > 307  --   --  235 227   < > =  values in this interval not displayed.   Blood Culture    Component Value Date/Time   SDES BLOOD LEFT HAND 11/06/2019 0824   SDES BLOOD RIGHT HAND 11/06/2019 0824   SPECREQUEST  11/06/2019 0824    BOTTLES DRAWN AEROBIC ONLY Blood Culture adequate volume   SPECREQUEST  11/06/2019 0824    BOTTLES DRAWN AEROBIC ONLY Blood Culture adequate volume   CULT  11/06/2019 0824    NO GROWTH 2 DAYS Performed at [facility], [place] [address], [place], [place] [address]    CULT  11/06/2019 [phone]    NO GROWTH 2 DAYS Performed at [facility], [place] [address], [place], [place] [address]    REPTSTATUS PENDING 11/06/2019 0824   REPTSTATUS PENDING 11/06/2019 0824    Cardiac Enzymes: Recent Labs  Lab 11/06/19 0827  CKTOTAL 171  CKMB 9.7*   CBG: Recent Labs  Lab 11/07/19 2000 11/08/19 0004 11/08/19 0406 11/08/19 0803 11/08/19 1135  GLUCAP 152* 153* 152* 148* 179*   Iron Studies: No results for input(s): IRON, TIBC, TRANSFERRIN, FERRITIN in the last 72 hours.  Studies/Results: MR BRAIN WO CONTRAST  Result Date: 11/07/2019 CLINICAL DATA:  Focal neuro deficit, > 6 hrs, stroke suspected concern for CVA EXAM: MRI HEAD WITHOUT CONTRAST TECHNIQUE: Multiplanar, multiecho pulse sequences of the brain and surrounding structures were obtained without intravenous contrast. COMPARISON:  None. FINDINGS: BRAIN: No acute infarct, acute hemorrhage or extra-axial collection. Early confluent hyperintense T2-weighted signal of the periventricular and deep white matter, most commonly due to chronic ischemic microangiopathy. There is generalized atrophy without lobar  predilection. There are multiple chronic microhemorrhages in a predominantly central distribution. Normal midline structures. 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 VASCULAR: Major flow voids are preserved. SKULL AND UPPER CERVICAL SPINE: Normal calvarium and skull base. Visualized upper cervical spine and soft tissues are normal. SINUSES/ORBITS: No paranasal sinus fluid levels or advanced mucosal thickening. No mastoid or middle ear effusion. Normal orbits. IMPRESSION: 1. No acute intracranial abnormality. 2. Generalized atrophy and chronic ischemic microangiopathy. 3. Right basal ganglia and left cerebellar microhemorrhages, unchanged. Electronically Signed   By: Ulyses Jarred M.D.   On: 11/07/2019 00:58   DG CHEST PORT 1 VIEW  Result Date: 11/07/2019 CLINICAL DATA:  Intubated EXAM: PORTABLE CHEST 1 VIEW COMPARISON:  Chest radiograph from one day prior. FINDINGS: Endotracheal tube tip is 3.3 cm above the carina. Enteric tube enters stomach with the tip not seen on this image. Stable cardiomediastinal silhouette with mild cardiomegaly. No pneumothorax. Trace bilateral pleural effusions, similar. Mild pulmonary edema, slightly worsened. Left lung base atelectasis, worsened. IMPRESSION: 1. Well-positioned support structures. 2. Mild congestive heart failure, slightly worsened. 3. Trace bilateral pleural effusions, similar. 4. Worsened left lung base atelectasis. Electronically Signed   By: Ilona Sorrel M.D.   On: 11/07/2019 06:52   Medications: . sodium chloride 10 mL/hr at 11/08/19 0800  . ampicillin-sulbactam (UNASYN) IV 3 g (11/08/19 1052)  . dextrose 10 mL/hr at 11/08/19 0800  . fentaNYL infusion INTRAVENOUS 75 mcg/hr (11/08/19 0800)  . heparin 1,750 Units/hr (11/08/19 0800)  . norepinephrine (LEVOPHED) Adult infusion Stopped (11/06/19 1623)   . sodium chloride   Intravenous Once  . chlorhexidine gluconate (MEDLINE KIT)  15 mL Mouth Rinse BID  . Chlorhexidine Gluconate Cloth  6 each  Topical Daily  . clopidogrel  75 mg Per Tube Daily  . docusate  100 mg Per Tube BID  . ferrous sulfate  300 mg Per Tube Q M,W,F  .  finasteride  5 mg Oral QHS  . fluticasone  2 spray Each Nare Daily  . furosemide  60 mg Intravenous Once  . insulin aspart  0-15 Units Subcutaneous Q4H  . insulin detemir  25 Units Subcutaneous BID  . levothyroxine  150 mcg Per Tube QAC breakfast  . mouth rinse  15 mL Mouth Rinse 10 times per day  . melatonin  3 mg Per Tube QHS  . metoprolol tartrate  12.5 mg Per Tube BID  . midodrine  10 mg Per Tube TID WC  . nortriptyline  50 mg Per Tube QHS  . pantoprazole sodium  40 mg Per Tube BID  . polyethylene glycol  17 g Per Tube Daily  . potassium chloride  40 mEq Oral Once  . senna  2 tablet Per Tube q AM   And  . senna  1 tablet Per Tube QHS  . sertraline  25 mg Per Tube [id number]  . sodium bicarbonate  50 mEq Intravenous Once  . sodium chloride flush  3 mL Intravenous Q12H  . traZODone  100 mg Per Tube QHS    Assessment/Plan **AKI on CKD 4:   Baseline creatinine 2-2.2.  solitary kidney.  Had bleeding with some degree of shock likely causing ATN however work-up has been reassuring and no other obvious signs of insult.  Renal function was improving but worsened in the setting of cardiac arrest on 5/28.  Fortunately creatinine is now improving today and urine output has been good. cardiology redosing Lasix 60 mg daily.  We will continue to monitor renal function panel daily.  No indication for dialysis at this time but the patient has expressed that he would want this as a last resort.    **Cardiac arrhythmia: Ventricular fibrillation possibly secondary to cardiac ischemia or hypoxia.  Now with atrial fibrillation intermittently in sinus.  Primary team and cardiology managing and starting beta-blocker today.  We will continue to follow  **Left atrial thrombus: Heparin drip and cardiology following  **Shock, improved: History of such without any obvious  infection.  He has been on midodrine with some improvement.  Now with worsening shock related to ventricular fibrillation and cardiac arrest.  Fortunately now off norepinephrine.  Continue midodrine.  **NSTEMI, h/o CAD with h/o PCI:  Cardiology following, holding on LHC for now in light of GIB and AKI.  No plans for LHC in light of high risk for CIN, dialysis.    **Anemia: acute blood loss s/p resection of gastric polyps.  Transfusion per primary.   **Hypokalemia: Resolved today with supplementation continue to monitor    **Hyponatremia, resolved: Sodium improving with urine output to 135 today.  Continue to monitor

## 2019-11-08 NOTE — Progress Notes (Signed)
DAILY PROGRESS NOTE   Patient Name: Matthew [name] Date of Encounter: 11/08/2019 Cardiologist: Larae Grooms, MD  Chief Complaint   Intubated, more awake  Patient Profile   Matthew [name] is a 74 y.o. male with a hx of coronary artery disease, CABG, hypertension, hyperlipidemia, paroxysmal atrial fibrillation, hypothyroidism, CKD who presents with chest pain and was found to have a GI bleed and mildly elevated troponins .  Who is being seen today for the evaluation of  Elevated troponins  at the request of  Dr. Tamala Julian.  Subjective   More awake today- remains intubated. Briefly converted to sinus this am - EKG shows scooped anteroseptal ST segments - now back in afib with RVR. Off pressors.  Objective   Vitals:   11/08/19 0630 11/08/19 0645 11/08/19 0759 11/08/19 0800  BP: 97/66 105/70  (!) 89/73  Pulse: 73 74  73  Resp: _0 Temp:   97.7 F (36.5 C)   TempSrc:   Axillary   SpO2: 100% 100%  100%  Weight:      Height:        Intake/Output Summary (Last 24 hours) at 11/08/2019 1122 Last data filed at 11/08/2019 0800 Gross per 24 hour  Intake 1278.17 ml  Output 1675 ml  Net -396.83 ml   Filed Weights   11/06/19 0427 11/06/19 0800 11/07/19 0446  Weight: 131.7 kg 131.7 kg 124.8 kg    Physical Exam   General appearance: intubated, awake on vent, mitts in place Neck: no carotid bruit, no JVD and thyroid not enlarged, symmetric, no tenderness/mass/nodules Lungs: diminished breath sounds bilaterally Heart: irregularly irregular rhythm Abdomen: soft, non-tender; bowel sounds normal; no masses,  no organomegaly Extremities: extremities normal, atraumatic, no cyanosis or edema Pulses: 2+ and symmetric Skin: cool, dry Neurologic: Mental status: awake, following commands, wants to speak Psych: Frustrated  Inpatient Medications    Scheduled Meds: . sodium chloride   Intravenous Once  . chlorhexidine gluconate (MEDLINE KIT)  15 mL Mouth Rinse BID  .  Chlorhexidine Gluconate Cloth  6 each Topical Daily  . clopidogrel  75 mg Per Tube Daily  . docusate  100 mg Per Tube BID  . ferrous sulfate  300 mg Per Tube Q M,W,F  . finasteride  5 mg Oral QHS  . fluticasone  2 spray Each Nare Daily  . insulin aspart  0-15 Units Subcutaneous Q4H  . insulin detemir  25 Units Subcutaneous BID  . levothyroxine  150 mcg Per Tube QAC breakfast  . mouth rinse  15 mL Mouth Rinse 10 times per day  . melatonin  3 mg Per Tube QHS  . midodrine  10 mg Per Tube TID WC  . nortriptyline  50 mg Per Tube QHS  . pantoprazole sodium  40 mg Per Tube BID  . polyethylene glycol  17 g Per Tube Daily  . senna  2 tablet Per Tube q AM   And  . senna  1 tablet Per Tube QHS  . sertraline  25 mg Per Tube BH-[id number]  . sodium bicarbonate  50 mEq Intravenous Once  . sodium chloride flush  3 mL Intravenous Q12H  . traZODone  100 mg Per Tube QHS    Continuous Infusions: . sodium chloride 10 mL/hr at 11/08/19 0800  . ampicillin-sulbactam (UNASYN) IV 3 g (11/08/19 1052)  . dextrose 10 mL/hr at 11/08/19 0800  . fentaNYL infusion INTRAVENOUS 75 mcg/hr (11/08/19 0800)  . heparin 1,750 Units/hr (11/08/19 0800)  .  norepinephrine (LEVOPHED) Adult infusion Stopped (11/06/19 1623)    PRN Meds: acetaminophen, alum & mag hydroxide-simeth **AND** lidocaine, fentaNYL, HYDROmorphone (DILAUDID) injection, labetalol, lidocaine, midazolam, nitroGLYCERIN, OLANZapine zydis, polyethylene glycol, simethicone, sodium chloride, triamcinolone cream   Labs   Results for orders placed or performed during the hospital encounter of 10/19/2019 (from the past 48 hour(s))  Glucose, capillary     Status: Abnormal   Collection Time: 11/06/19 11:28 AM  Result Value Ref Range   Glucose-Capillary 162 (H) 70 - 99 mg/dL    Comment: Glucose reference range applies only to samples taken after fasting for at least 8 hours.  Glucose, capillary     Status: Abnormal   Collection Time: 11/06/19  3:36 PM  Result Value  Ref Range   Glucose-Capillary 133 (H) 70 - 99 mg/dL    Comment: Glucose reference range applies only to samples taken after fasting for at least 8 hours.  Heparin level (unfractionated)     Status: Abnormal   Collection Time: 11/06/19  4:37 PM  Result Value Ref Range   Heparin Unfractionated 0.15 (L) 0.30 - 0.70 IU/mL    Comment: (NOTE) If heparin results are below expected values, and patient dosage has  been confirmed, suggest follow up testing of antithrombin III levels. Performed at [facility], [place] [address] [place], [place] [address]   Troponin I (High Sensitivity)     Status: Abnormal   Collection Time: 11/06/19  4:37 PM  Result Value Ref Range   Troponin I (High Sensitivity) 4,199 (HH) <18 ng/L    Comment: CRITICAL VALUE NOTED.  VALUE IS CONSISTENT WITH PREVIOUSLY REPORTED AND CALLED VALUE. (NOTE) Elevated high sensitivity troponin I (hsTnI) values and significant  changes across serial measurements may suggest ACS but many other  chronic and acute conditions are known to elevate hsTnI results.  Refer to the Links section for chest pain algorithms and additional  guidance. Performed at [facility], [place] [address], [place], [place] [address]   Lactic acid, plasma     Status: Abnormal   Collection Time: 11/06/19  4:37 PM  Result Value Ref Range   Lactic Acid, Venous 2.4 (HH) 0.5 - 1.9 mmol/L    Comment: CRITICAL VALUE NOTED.  VALUE IS CONSISTENT WITH PREVIOUSLY REPORTED AND CALLED VALUE. Performed at [facility], [place] [address], [place], [place] [address]   Lactic acid, plasma     Status: Abnormal   Collection Time: 11/06/19  5:55 PM  Result Value Ref Range   Lactic Acid, Venous 2.5 (HH) 0.5 - 1.9 mmol/L    Comment: CRITICAL VALUE NOTED.  VALUE IS CONSISTENT WITH PREVIOUSLY REPORTED AND CALLED VALUE. Performed at [facility], [place] [address] [place], [place] [address]   Troponin I (High Sensitivity)     Status: Abnormal   Collection Time:  11/06/19  5:55 PM  Result Value Ref Range   Troponin I (High Sensitivity) 3,965 (HH) <18 ng/L    Comment: CRITICAL VALUE NOTED.  VALUE IS CONSISTENT WITH PREVIOUSLY REPORTED AND CALLED VALUE. (NOTE) Elevated high sensitivity troponin I (hsTnI) values and significant  changes across serial measurements may suggest ACS but many other  chronic and acute conditions are known to elevate hsTnI results.  Refer to the Links section for chest pain algorithms and additional  guidance. Performed at [facility], [place] [address] [place], [place] [address]   I-STAT 7, (LYTES, BLD GAS, ICA, H+H)     Status: Abnormal  Collection Time: 11/06/19  6:06 PM  Result Value Ref Range   pH, Arterial 7.425 7.350 - 7.450   pCO2 arterial 35.2 32.0 - 48.0 mmHg   pO2, Arterial 40 (LL) 83.0 - 108.0 mmHg   Bicarbonate 23.1 20.0 - 28.0 mmol/L   TCO2 24 22 - 32 mmol/L   O2 Saturation 76.0 %   Acid-base deficit 1.0 0.0 - 2.0 mmol/L   Sodium 133 (L) 135 - 145 mmol/L   Potassium 3.4 (L) 3.5 - 5.1 mmol/L   Calcium, Ion 0.99 (L) 1.15 - 1.40 mmol/L   HCT 27.0 (L) 39.0 - 52.0 %   Hemoglobin 9.2 (L) 13.0 - 17.0 g/dL   Collection site Radial    Drawn by RT    Sample type ARTERIAL   Glucose, capillary     Status: None   Collection Time: 11/06/19  7:55 PM  Result Value Ref Range   Glucose-Capillary 73 70 - 99 mg/dL    Comment: Glucose reference range applies only to samples taken after fasting for at least 8 hours.  Glucose, capillary     Status: Abnormal   Collection Time: 11/06/19  8:39 PM  Result Value Ref Range   Glucose-Capillary 138 (H) 70 - 99 mg/dL    Comment: Glucose reference range applies only to samples taken after fasting for at least 8 hours.  Glucose, capillary     Status: None   Collection Time: 11/06/19 10:10 PM  Result Value Ref Range   Glucose-Capillary 83 70 - 99 mg/dL    Comment: Glucose reference range applies only to samples taken after fasting for at least 8 hours.  Lactic acid,  plasma     Status: None   Collection Time: 11/06/19 11:15 PM  Result Value Ref Range   Lactic Acid, Venous 1.5 0.5 - 1.9 mmol/L    Comment: Performed at [facility] [address], [place], [place] [address]  Troponin I (High Sensitivity)     Status: Abnormal   Collection Time: 11/06/19 11:15 PM  Result Value Ref Range   Troponin I (High Sensitivity) 5,520 (HH) <18 ng/L    Comment: CRITICAL VALUE NOTED.  VALUE IS CONSISTENT WITH PREVIOUSLY REPORTED AND CALLED VALUE. (NOTE) Elevated high sensitivity troponin I (hsTnI) values and significant  changes across serial measurements may suggest ACS but many other  chronic and acute conditions are known to elevate hsTnI results.  Refer to the Links section for chest pain algorithms and additional  guidance. Performed at [facility], [place] [address], [place], [place] [address]   Glucose, capillary     Status: None   Collection Time: 11/06/19 11:26 PM  Result Value Ref Range   Glucose-Capillary 77 70 - 99 mg/dL    Comment: Glucose reference range applies only to samples taken after fasting for at least 8 hours.  Basic metabolic panel     Status: Abnormal   Collection Time: 11/06/19 11:27 PM  Result Value Ref Range   Sodium 133 (L) 135 - 145 mmol/L   Potassium 3.2 (L) 3.5 - 5.1 mmol/L   Chloride 97 (L) 98 - 111 mmol/L   CO2 17 (L) 22 - 32 mmol/L   Glucose, Bld 82 70 - 99 mg/dL    Comment: Glucose reference range applies only to samples taken after fasting for at least 8 hours.   BUN 63 (H) 8 - 23 mg/dL   Creatinine, Ser 3.81 (H) 0.61 - 1.24 mg/dL   Calcium 7.8 (L) 8.9 -  10.3 mg/dL   GFR calc non Af Amer 15 (L) >60 mL/min   GFR calc Af Amer 17 (L) >60 mL/min   Anion gap 19 (H) 5 - 15    Comment: Performed at [facility] [address] [place], [place] [address]  Magnesium     Status: Abnormal   Collection Time: 11/06/19 11:27 PM  Result Value Ref Range   Magnesium 2.5 (H) 1.7 - 2.4 mg/dL    Comment: Performed at  [facility] [address], [place], [place] [address]  Glucose, capillary     Status: Abnormal   Collection Time: 11/07/19 12:50 AM  Result Value Ref Range   Glucose-Capillary 64 (L) 70 - 99 mg/dL    Comment: Glucose reference range applies only to samples taken after fasting for at least 8 hours.  Troponin I (High Sensitivity)     Status: Abnormal   Collection Time: 11/07/19 12:56 AM  Result Value Ref Range   Troponin I (High Sensitivity) 5,904 (HH) <18 ng/L    Comment: CRITICAL VALUE NOTED.  VALUE IS CONSISTENT WITH PREVIOUSLY REPORTED AND CALLED VALUE. (NOTE) Elevated high sensitivity troponin I (hsTnI) values and significant  changes across serial measurements may suggest ACS but many other  chronic and acute conditions are known to elevate hsTnI results.  Refer to the Links section for chest pain algorithms and additional  guidance. Performed at [facility], [place] [address], [place], [place] [address]   Glucose, capillary     Status: Abnormal   Collection Time: 11/07/19  1:38 AM  Result Value Ref Range   Glucose-Capillary 110 (H) 70 - 99 mg/dL    Comment: Glucose reference range applies only to samples taken after fasting for at least 8 hours.  Procalcitonin     Status: None   Collection Time: 11/07/19  2:15 AM  Result Value Ref Range   Procalcitonin 0.73 ng/mL    Comment:        Interpretation: PCT > 0.5 ng/mL and <= 2 ng/mL: Systemic infection (sepsis) is possible, but other conditions are known to elevate PCT as well. (NOTE)       Sepsis PCT Algorithm           Lower Respiratory Tract                                      Infection PCT Algorithm    ----------------------------     ----------------------------         PCT < 0.25 ng/mL                PCT < 0.10 ng/mL         Strongly encourage             Strongly discourage   discontinuation of antibiotics    initiation of antibiotics    ----------------------------     -----------------------------        PCT 0.25 - 0.50 ng/mL            PCT 0.10 - 0.25 ng/mL               OR       >80% decrease in PCT            Discourage initiation of  antibiotics      Encourage discontinuation           of antibiotics    ----------------------------     -----------------------------         PCT >= 0.50 ng/mL              PCT 0.26 - 0.50 ng/mL                AND       <80% decrease in PCT             Encourage initiation of                                             antibiotics       Encourage continuation           of antibiotics    ----------------------------     -----------------------------        PCT >= 0.50 ng/mL                  PCT > 0.50 ng/mL               AND         increase in PCT                  Strongly encourage                                      initiation of antibiotics    Strongly encourage escalation           of antibiotics                                     -----------------------------                                           PCT <= 0.25 ng/mL                                                 OR                                        > 80% decrease in PCT                                     Discontinue / Do not initiate                                             antibiotics Performed at [facility], [place] [address], [place], [place] [address]   CBC     Status: Abnormal   Collection Time: 11/07/19  2:15  AM  Result Value Ref Range   WBC 9.7 4.0 - 10.5 K/uL   RBC 3.06 (L) 4.22 - 5.81 MIL/uL   Hemoglobin 9.3 (L) 13.0 - 17.0 g/dL   HCT 29.0 (L) 39.0 - 52.0 %   MCV 94.8 80.0 - 100.0 fL   MCH 30.4 26.0 - 34.0 pg   MCHC 32.1 30.0 - 36.0 g/dL   RDW 17.7 (H) 11.5 - 15.5 %   Platelets 235 150 - 400 K/uL   nRBC 1.0 (H) 0.0 - 0.2 %    Comment: Performed at [facility] [address] [place], [place] [address]  Comprehensive metabolic panel     Status: Abnormal   Collection Time: 11/07/19  2:15 AM  Result Value  Ref Range   Sodium 134 (L) 135 - 145 mmol/L   Potassium 2.9 (L) 3.5 - 5.1 mmol/L   Chloride 97 (L) 98 - 111 mmol/L   CO2 23 22 - 32 mmol/L   Glucose, Bld 100 (H) 70 - 99 mg/dL    Comment: Glucose reference range applies only to samples taken after fasting for at least 8 hours.   BUN 61 (H) 8 - 23 mg/dL   Creatinine, Ser 3.74 (H) 0.61 - 1.24 mg/dL   Calcium 7.8 (L) 8.9 - 10.3 mg/dL   Total Protein 6.1 (L) 6.5 - 8.1 g/dL   Albumin 2.7 (L) 3.5 - 5.0 g/dL   AST 317 (H) 15 - 41 U/L   ALT 843 (H) 0 - 44 U/L   Alkaline Phosphatase 40 38 - 126 U/L   Total Bilirubin 0.9 0.3 - 1.2 mg/dL   GFR calc non Af Amer 15 (L) >60 mL/min   GFR calc Af Amer 17 (L) >60 mL/min   Anion gap 14 5 - 15    Comment: Performed at [facility] [address] [place], [place] [address]  Heparin level (unfractionated)     Status: Abnormal   Collection Time: 11/07/19  2:15 AM  Result Value Ref Range   Heparin Unfractionated 0.12 (L) 0.30 - 0.70 IU/mL    Comment: (NOTE) If heparin results are below expected values, and patient dosage has  been confirmed, suggest follow up testing of antithrombin III levels. Performed at [facility], [place] [address], [place], [place] [address]   Lactic acid, plasma     Status: None   Collection Time: 11/07/19  2:15 AM  Result Value Ref Range   Lactic Acid, Venous 1.5 0.5 - 1.9 mmol/L    Comment: Performed at [facility] [address], [place], [place] [address]  Glucose, capillary     Status: Abnormal   Collection Time: 11/07/19  3:47 AM  Result Value Ref Range   Glucose-Capillary 60 (L) 70 - 99 mg/dL    Comment: Glucose reference range applies only to samples taken after fasting for at least 8 hours.  Glucose, capillary     Status: Abnormal   Collection Time: 11/07/19  4:34 AM  Result Value Ref Range   Glucose-Capillary 127 (H) 70 - 99 mg/dL    Comment: Glucose reference range applies only to samples taken after fasting for at least 8 hours.  Glucose,  capillary     Status: None   Collection Time: 11/07/19  7:59 AM  Result Value Ref Range   Glucose-Capillary 76 70 - 99 mg/dL    Comment: Glucose reference range applies only to samples taken after fasting for at least 8 hours.  Heparin level (unfractionated)     Status: Abnormal   Collection Time: 11/07/19 10:57 AM  Result Value Ref Range   Heparin Unfractionated 0.29 (L) 0.30 - 0.70 IU/mL    Comment: (NOTE) If heparin results are below expected values, and patient dosage has  been confirmed, suggest follow up testing of antithrombin III levels. Performed at [facility], [place] [address] [place], [place] [address]   Glucose, capillary     Status: None   Collection Time: 11/07/19 11:55 AM  Result Value Ref Range   Glucose-Capillary 98 70 - 99 mg/dL    Comment: Glucose reference range applies only to samples taken after fasting for at least 8 hours.  Basic metabolic panel     Status: Abnormal   Collection Time: 11/07/19  3:20 PM  Result Value Ref Range   Sodium 133 (L) 135 - 145 mmol/L   Potassium 4.5 3.5 - 5.1 mmol/L    Comment: SLIGHT HEMOLYSIS   Chloride 98 98 - 111 mmol/L   CO2 21 (L) 22 - 32 mmol/L   Glucose, Bld 120 (H) 70 - 99 mg/dL    Comment: Glucose reference range applies only to samples taken after fasting for at least 8 hours.   BUN 62 (H) 8 - 23 mg/dL   Creatinine, Ser 3.43 (H) 0.61 - 1.24 mg/dL   Calcium 7.7 (L) 8.9 - 10.3 mg/dL   GFR calc non Af Amer 17 (L) >60 mL/min   GFR calc Af Amer 19 (L) >60 mL/min   Anion gap 14 5 - 15    Comment: Performed at [facility] [address] [place], [place] [address]  Magnesium     Status: None   Collection Time: 11/07/19  3:20 PM  Result Value Ref Range   Magnesium 2.4 1.7 - 2.4 mg/dL    Comment: Performed at [facility], [place] [address], [place], [place] [address]  Glucose, capillary     Status: Abnormal   Collection Time: 11/07/19  3:23 PM  Result Value Ref Range   Glucose-Capillary 124 (H) 70 - 99  mg/dL    Comment: Glucose reference range applies only to samples taken after fasting for at least 8 hours.  Heparin level (unfractionated)     Status: None   Collection Time: 11/07/19  6:16 PM  Result Value Ref Range   Heparin Unfractionated 0.41 0.30 - 0.70 IU/mL    Comment: (NOTE) If heparin results are below expected values, and patient dosage has  been confirmed, suggest follow up testing of antithrombin III levels. Performed at [facility], [place] [address], [place], [place] [address]   MRSA PCR Screening     Status: None   Collection Time: 11/07/19  7:46 PM   Specimen: Nasopharyngeal  Result Value Ref Range   MRSA by PCR NEGATIVE NEGATIVE    Comment:        The GeneXpert MRSA Assay (FDA approved for NASAL specimens only), is one component of a comprehensive MRSA colonization surveillance program. It is not intended to diagnose MRSA infection nor to guide or monitor treatment for MRSA infections. Performed at [facility], [place] [address] [place], [place] [address]   Glucose, capillary     Status: Abnormal   Collection Time: 11/07/19  8:00 PM  Result Value Ref Range   Glucose-Capillary 152 (H) 70 - 99 mg/dL    Comment: Glucose reference range applies only to samples taken after fasting for at least  8 hours.   Comment 1 Notify RN   Glucose, capillary     Status: Abnormal   Collection Time: 11/08/19 12:04 AM  Result Value Ref Range   Glucose-Capillary 153 (H) 70 - 99 mg/dL    Comment: Glucose reference range applies only to samples taken after fasting for at least 8 hours.   Comment 1 Notify RN   Procalcitonin     Status: None   Collection Time: 11/08/19  1:54 AM  Result Value Ref Range   Procalcitonin 0.44 ng/mL    Comment:        Interpretation: PCT (Procalcitonin) <= 0.5 ng/mL: Systemic infection (sepsis) is not likely. Local bacterial infection is possible. (NOTE)       Sepsis PCT Algorithm           Lower Respiratory Tract                                       Infection PCT Algorithm    ----------------------------     ----------------------------         PCT < 0.25 ng/mL                PCT < 0.10 ng/mL         Strongly encourage             Strongly discourage   discontinuation of antibiotics    initiation of antibiotics    ----------------------------     -----------------------------       PCT 0.25 - 0.50 ng/mL            PCT 0.10 - 0.25 ng/mL               OR       >80% decrease in PCT            Discourage initiation of                                            antibiotics      Encourage discontinuation           of antibiotics    ----------------------------     -----------------------------         PCT >= 0.50 ng/mL              PCT 0.26 - 0.50 ng/mL               AND        <80% decrease in PCT             Encourage initiation of                                             antibiotics       Encourage continuation           of antibiotics    ----------------------------     -----------------------------        PCT >= 0.50 ng/mL                  PCT > 0.50 ng/mL               AND  increase in PCT                  Strongly encourage                                      initiation of antibiotics    Strongly encourage escalation           of antibiotics                                     -----------------------------                                           PCT <= 0.25 ng/mL                                                 OR                                        > 80% decrease in PCT                                     Discontinue / Do not initiate                                             antibiotics Performed at [facility], [address] [place], [place] [address]   CBC     Status: Abnormal   Collection Time: 11/08/19  1:54 AM  Result Value Ref Range   WBC 11.3 (H) 4.0 - 10.5 K/uL   RBC 3.10 (L) 4.22 - 5.81 MIL/uL   Hemoglobin 9.3 (L) 13.0 - 17.0 g/dL   HCT 30.1 (L) 39.0 - 52.0 %   MCV  97.1 80.0 - 100.0 fL   MCH 30.0 26.0 - 34.0 pg   MCHC 30.9 30.0 - 36.0 g/dL   RDW 18.1 (H) 11.5 - 15.5 %   Platelets 227 150 - 400 K/uL   nRBC 0.3 (H) 0.0 - 0.2 %    Comment: Performed at [facility], [place] [address], [place], [place] [address]  Heparin level (unfractionated)     Status: None   Collection Time: 11/08/19  1:54 AM  Result Value Ref Range   Heparin Unfractionated 0.45 0.30 - 0.70 IU/mL    Comment: (NOTE) If heparin results are below expected values, and patient dosage has  been confirmed, suggest follow up testing of antithrombin III levels. Performed at [facility], [place] [address], [place],  [address]   Basic metabolic panel     Status: Abnormal   Collection Time: 11/08/19  1:54 AM  Result Value Ref Range   Sodium 135 135 - 145 mmol/L   Potassium 4.0 3.5 - 5.1 mmol/L   Chloride 97 (L) 98 -  111 mmol/L   CO2 22 22 - 32 mmol/L   Glucose, Bld 163 (H) 70 - 99 mg/dL    Comment: Glucose reference range applies only to samples taken after fasting for at least 8 hours.   BUN 59 (H) 8 - 23 mg/dL   Creatinine, Ser 3.29 (H) 0.61 - 1.24 mg/dL   Calcium 8.0 (L) 8.9 - 10.3 mg/dL   GFR calc non Af Amer 17 (L) >60 mL/min   GFR calc Af Amer 20 (L) >60 mL/min   Anion gap 16 (H) 5 - 15    Comment: Performed at [facility] [address], [place], [place] [address]  Magnesium     Status: None   Collection Time: 11/08/19  1:54 AM  Result Value Ref Range   Magnesium 2.2 1.7 - 2.4 mg/dL    Comment: Performed at [facility] [address] [place], [place] [address]  Glucose, capillary     Status: Abnormal   Collection Time: 11/08/19  4:06 AM  Result Value Ref Range   Glucose-Capillary 152 (H) 70 - 99 mg/dL    Comment: Glucose reference range applies only to samples taken after fasting for at least 8 hours.   Comment 1 Notify RN   Glucose, capillary     Status: Abnormal   Collection Time: 11/08/19  8:03 AM  Result Value Ref Range    Glucose-Capillary 148 (H) 70 - 99 mg/dL    Comment: Glucose reference range applies only to samples taken after fasting for at least 8 hours.    ECG   N/A  Telemetry   Afib with RVR (briefly sinus this am with scooped ST depression, likely repolarization changes)- Personally Reviewed  Radiology    MR BRAIN WO CONTRAST  Result Date: 11/07/2019 CLINICAL DATA:  Focal neuro deficit, > 6 hrs, stroke suspected concern for CVA EXAM: MRI HEAD WITHOUT CONTRAST TECHNIQUE: Multiplanar, multiecho pulse sequences of the brain and surrounding structures were obtained without intravenous contrast. COMPARISON:  None. FINDINGS: BRAIN: No acute infarct, acute hemorrhage or extra-axial collection. Early confluent hyperintense T2-weighted signal of the periventricular and deep white matter, most commonly due to chronic ischemic microangiopathy. There is generalized atrophy without lobar predilection. There are multiple chronic microhemorrhages in a predominantly central distribution. Normal midline structures. 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 VASCULAR: Major flow voids are preserved. SKULL AND UPPER CERVICAL SPINE: Normal calvarium and skull base. Visualized upper cervical spine and soft tissues are normal. SINUSES/ORBITS: No paranasal sinus fluid levels or advanced mucosal thickening. No mastoid or middle ear effusion. Normal orbits. IMPRESSION: 1. No acute intracranial abnormality. 2. Generalized atrophy and chronic ischemic microangiopathy. 3. Right basal ganglia and left cerebellar microhemorrhages, unchanged. Electronically Signed   By: Ulyses Jarred M.D.   On: 11/07/2019 00:58   DG CHEST PORT 1 VIEW  Result Date: 11/07/2019 CLINICAL DATA:  Intubated EXAM: PORTABLE CHEST 1 VIEW COMPARISON:  Chest radiograph from one day prior. FINDINGS: Endotracheal tube tip is 3.3 cm above the carina. Enteric tube enters stomach with the tip not seen on this image. Stable cardiomediastinal silhouette with mild  cardiomegaly. No pneumothorax. Trace bilateral pleural effusions, similar. Mild pulmonary edema, slightly worsened. Left lung base atelectasis, worsened. IMPRESSION: 1. Well-positioned support structures. 2. Mild congestive heart failure, slightly worsened. 3. Trace bilateral pleural effusions, similar. 4. Worsened left lung base atelectasis. Electronically Signed   By: Ilona Sorrel M.D.   On: 11/07/2019 06:52    Cardiac Studies   TEE  Bedside (11/06/2018)  Verbal consent from wife Sedation Fentanyl and Precedex  Mild LVE EF 35-40% more diffuse hypokinesis slightly worse in inferior wall Moderate MR Moderate TR Moderate RV hypokinesis with mild dilatation Normal aorta with no dissection and minimal plaque No ASD/PFO No effusion  Large amount of LAA thrombus   Continue supportive care Lasix for CHF Amiodarone if needed for recurrent ventricular arrhythmia Start heparin for PAF and LAA thrombus  Vent management per CCM CXR does not look that bad in regard To CHF. ETT advance 4 cm before TEE  Wife has been updated at length  Jenkins Rouge MD [facility]  Assessment   Principal Problem:   Chest pain Active Problems:   Hypothyroidism   PAF (paroxysmal atrial fibrillation) (HCC)   CKD (chronic kidney disease), stage III   AAA (abdominal aortic aneurysm) (HCC)   Diabetic polyneuropathy associated with type 2 diabetes mellitus (HCC)   OSA (obstructive sleep apnea)   Prolonged QT interval   Depression   GI bleed   Acute blood loss anemia   Hyperlipidemia LDL goal <70   AKI (acute kidney injury) ([place])   Blood in stool   Stage 4 chronic kidney disease (HCC)   Acute renal failure superimposed on stage 4 chronic kidney disease (HCC)   NSTEMI (non-ST elevated myocardial infarction) ([place])   Acute lower UTI   Respiratory arrest ([place])   Plan   1. Briefly converted this am, but now back in afib with RVR. BP will now allow to start low dose BB. Continue heparin - consider switch to  NOAC once extubated.  Time Spent Directly with Patient:  I have spent a total of 35 minutes with the patient reviewing hospital notes, telemetry, EKGs, labs and examining the patient as well as establishing an assessment and plan that was discussed personally with the patient.  > 50% of time was spent in direct patient care.  Length of Stay:  LOS: 12 days   Pixie Casino, MD, [facility], [place] Director of the Advanced Lipid Disorders &  Cardiovascular Risk Reduction Clinic Diplomate of the American Board of Clinical Lipidology Attending Cardiologist  Direct Dial: [phone]  Fax: [phone]  Website:  [email or web address]  Nadean Corwin Bridney Guadarrama 11/08/2019, 11:22 AM

## 2019-11-08 NOTE — Progress Notes (Signed)
NAME:  Matthew [name], MRN:  [id number], DOB:  07/28/1945, LOS: 12 ADMISSION DATE:  11/04/2019, CONSULTATION DATE:  11/06/19 REFERRING MD:  [name] - TRH, CHIEF COMPLAINT:  VFib arrest  Brief History   74 yo M admitted with NSTEMI, sustained VFib cardiac arrest overnight (5/28), with ROSC after approx 5 minutes   History of present illness   74 yo M PMH CAD s/p CABGx5, pAFib on eliquis, AAA, CKD IV, HTN, HLD, cocaine abuse, chronic hypotension who presents 5/20 with L sided chest pain. Pain was sudden onset while watching a basketball game. Initially pain described as dull, radiating down L arm. Denied concomitant diaphoresis, n/v, SOB. Endorsed similar sx 3 days prior, with diaphoresis and nausea, but resolved after nitro. Admitted with NSTEMI. Prior [facility] 10/2018 with severe triple vessel CAD sp 5v CABG with 4/5 patent grafts and successful DES to mid body of SVF to OM2 OM3.  Hospital course complicated by worsening GIB, s/p endoscopy 5/24, with clips placed to oozing gastric polyps. Cardiology has deferred cardiac cath due to AKI on CKD, GIB for which eliquis has been held. On 5/25 neurology was consulted for aphagia with AMS. CT H without signs of acute CVA. Felt to be toxic metabolic encephalopathy.  5/27 nephrology consulted for progressive AKI on CKD with solitary kidney.  Hospital course marked by ongoing hypotensive episodes and ongoing complaints of chest pain.  5/28 pt sustained a VFib arrest. Transferred to ICU   Past [place] [facility] Events   5/20 admitted with chest pain, GIB  On 5/25 neurology was consulted for aphagia with AMS. CT H without signs of acute CVA. Felt to be toxic metabolic encephalopathy.  5/27 nephrology consulted for progressive AKI on CKD with solitary kidney.  Hospital course marked by ongoing hypotensive episodes and ongoing complaints of chest pain.  5/28 pt sustained a VFib arrest. Transferred to ICU   Consults:   Cardiology Neurology Nephrology PCCM   Procedures:  5/24 endoscopy 5/28 ETT>>  Significant Diagnostic Tests:  CT a/p 5/26> small bilateral pleural effusions. Lower lobe atelectasis, possible PNA. R infra hepatic fat stranding. No extraluminal gas or bowel wall thickening. Some stranding around midline pancreas  EGD/ Colonoscopy: 5/24 Oozing polyps, both were resected and clipped>> Needs follow up colonoscopy for small pre-cancerous polyps  ECHO 5/25> LVEF 50-55%  Micro Data:  5/26 BCx> staph species x 1   Antimicrobials:  Cefepime, end 5/28 5/28 unasyn>>  Interim history/subjective:  CXR 5/30>> Worsening pulmonary edema + 2.6 L  Afebrile, WBC 11.3 Empiric Unasyn started 5/28  Objective   Blood pressure (!) 89/73, pulse 73, temperature 97.9 F (36.6 C), temperature source Axillary, resp. rate 16, height 6' (1.829 m), weight 124.8 kg, SpO2 100 %.    Vent Mode: PSV;CPAP FiO2 (%):  [40 %] 40 % Set Rate:  [16 bmp] 16 bmp Vt Set:  [620 mL] 620 mL PEEP:  [5 cmH20] 5 cmH20 Pressure Support:  [10 cmH20] 10 cmH20 Plateau Pressure:  [17 cmH20-22 cmH20] 17 cmH20   Intake/Output Summary (Last 24 hours) at 11/08/2019 1333 Last data filed at 11/08/2019 0800 Gross per 24 hour  Intake 1230.8 ml  Output 1675 ml  Net -444.2 ml   Filed Weights   11/06/19 0427 11/06/19 0800 11/07/19 0446  Weight: 131.7 kg 131.7 kg 124.8 kg    Examination: General: Chronically and critically ill appearing older adult M, intubated and sedated NAD , easily arousable, calm HENT: NCAT pink mmm ETT secure. Trachea  midline . OG tube Lungs: Bibasilar crackles. Clear upper lobes,Diminished per bases, Symmetrical chest expansion. Mechanically ventilated Cardiovascular: irir , rate controlled s1s2 no rgm cap refill < 3 seconds Abdomen: soft round ndnt, BS diminished Extremities: Symmetrical bulk and tone. + edema. No clubbing Neuro: Sedated, responds to light  stimulation,  following commands GU:  Concentrated amber urine   Resolved Hospital Problem list     Assessment & Plan:   Acute respiratory failure with hypoxia in setting of cardiac arrest requiring intubation  Hx OSA Possible CAP on CT, at risk for aspiration PNA following arrest CXR + for Pulmonary edema  P - Cont MV - Daily SBT - ABG prn - Trend CXR - PAD, VAP  - empiric unasyn - Lasix for diuresis as renal fxn allows  VFib arrest -unclear if arrest etiology possibly r/t documented respiratory complaints prior vs primary cardiac etiology Troponins are climbing Hx. QTc prolongation P -Trend  cmp mag phos, trops, ckmb - appreciate cards assist>  TEE -- visualized clot in R atria  -dc antihypertensives  -heparin gtt per pharmacy -not candidate for cath per cards 2/2 renal and GI issues - Metoprolol added per cards for rate control 5/30 - QTc bedside monitoring - Avoid QTc prolongation medications   Shock -hx chronic orthostatic hypotension  - initially in shock following cardiac arrest. Bedside TEE with LVEF 35-50%, more diffuse hypokinesis, LAA thrombus seen on TEE  Off pressors 5/29 am Elevated LFT's P -NE for MAP goal > 65 -supportive care  -continue midodrine  -heart failure engaged by cards--  - CMET with LFT's in am to trend  LAA thrombus -seen on bedside TEE by cards on 5/28 P -heparin gtt  NSTEMI CAD  Angina  P -cardiology following, not candidate for cath   Paroxysmal AF P -ICU monitoring  -heparin gtt - Metoprolol added 5/30  HTN HLD P -holding antihypertensives in setting of shock  AKI on CKD IV  -solitary kidney  -suspected AKI due to hypotension, now with ATN Creatinine slowly down trending P -nephrology following, no indication for RRT yet  ABLA due to GI Bleed, s/p endoscopy with gastric polyp clipping GERD GI following P - trend CBC, goal hgb> 8  - Monitor for obvious bleeding  AAA -3 x 3.2cm aneurysm of distal abdominal aorta P -op follow up imaging   -will need cocaine cessation counseling and compliance with BP medications   DM Hypoglycemic episodes, Currently on D10 P -SSI + basal - CBG's - Will start TF at trickle feeds  Hypothyroidism P -synthroid   Depression P -nortriptyline, zoloft, zyprexa, trazodone   Best practice:  Diet: NPO/ TF Pain/Anxiety/Delirium protocol (if indicated): Prn fent VAP protocol (if indicated): yes  DVT prophylaxis: heparin gtt GI prophylaxis: protonix  Glucose control: SSI + basal  Mobility: BR Code Status: Full  Family Communication: critical care communication pending 5/28.  Disposition: ICU   Critical care time:  40 min    Magdalen Spatz, MSN, AGACNP-BC [place] for personal pager and assignments PCCM on call pager [phone]  11/08/2019, 1:33 PM

## 2019-11-08 NOTE — Progress Notes (Signed)
[place] for heparin Indication: chest pain/ACS and atrial fibrillation  Allergies  Allergen Reactions  . Amiodarone Other (See Comments)    Caused lung and liver toxicity  . Tetanus Toxoids Hives  . Morphine And Related Other (See Comments)    "made him crazy and heart was racing"    Patient Measurements: Height: 6' (182.9 cm) Weight: 124.8 kg (275 lb 2.2 oz) IBW/kg (Calculated) : 77.6 Heparin Dosing Weight: 107 kg  Vital Signs: Temp: 97.9 F (36.6 C) (05/30 0007) Temp Source: Oral (05/30 0007) BP: 117/84 (05/30 0215) Pulse Rate: 130 (05/30 0215)  Labs: Recent Labs    10/28/2019 1638 11/06/19 0824 11/06/19 0824 11/06/19 0827 11/06/19 1057 11/06/19 1637 11/06/19 1755 11/06/19 1806 11/06/19 2315 11/06/19 2327 11/07/19 0056 11/07/19 0215 11/07/19 0215 11/07/19 1057 11/07/19 1520 11/07/19 1816 11/08/19 0154  HGB  --  9.1*   < >  --   --    < >  --  9.2*  --   --   --  9.3*  --   --   --   --  9.3*  HCT  --  29.6*   < >  --   --   --   --  27.0*  --   --   --  29.0*  --   --   --   --  30.1*  PLT  --  307  --   --   --   --   --   --   --   --   --  235  --   --   --   --  227  HEPARINUNFRC  --   --   --   --    < >   < >  --   --   --   --   --  0.12*   < > 0.29*  --  0.41 0.45  CREATININE  --  4.16*  --   --   --    < >  --   --   --    < >  --  3.74*  --   --  3.43*  --  3.29*  CKTOTAL  --   --   --  171  --   --   --   --   --   --   --   --   --   --   --   --   --   CKMB  --   --   --  9.7*  --   --   --   --   --   --   --   --   --   --   --   --   --   TROPONINIHS   < >  --   --  2,001*   < >  --  2,585*  --  5,520*  --  5,904*  --   --   --   --   --   --    < > = values in this interval not displayed.    Estimated Creatinine Clearance: 26.5 mL/min (A) (by C-G formula based on SCr of 3.29 mg/dL (H)).  Assessment: 74 yo m with complicated hospital course, now with VFib arrest this am. Has had a GI bleed, NSTEMI,  Code stroke called during stay.  Hx of apixaban PTA for afib  Had endoscopy on 5/24 with  clipped polyps Bedside TEE shows left atrial appendage clot MRI brain ruled out CVA will increase HL goal back to 0.3-0.7  Heparin level therapeutic (0.45) on gtt at 1750 units/hr. No bleeding noted.  Goal of Therapy:  Heparin level 0.3-0.7 units/ml  Monitor platelets by anticoagulation protocol: Yes   Plan:  Continue heparin at 1750 units/hr Monitor daily heparin level and CBC, s/sx bleeding  Sherlon Handing, PharmD, BCPS Please see amion for complete clinical pharmacist phone list 11/08/2019 2:45 AM

## 2019-11-09 ENCOUNTER — Inpatient Hospital Stay (HOSPITAL_COMMUNITY): Payer: 59

## 2019-11-09 DIAGNOSIS — J9600 Acute respiratory failure, unspecified whether with hypoxia or hypercapnia: Secondary | ICD-10-CM

## 2019-11-09 LAB — BASIC METABOLIC PANEL
Anion gap: 10 (ref 5–15)
BUN: 83 mg/dL — ABNORMAL HIGH (ref 8–23)
CO2: 22 mmol/L (ref 22–32)
Calcium: 7.7 mg/dL — ABNORMAL LOW (ref 8.9–10.3)
Chloride: 102 mmol/L (ref 98–111)
Creatinine, Ser: 3.77 mg/dL — ABNORMAL HIGH (ref 0.61–1.24)
GFR calc Af Amer: 17 mL/min — ABNORMAL LOW (ref >60)
GFR calc non Af Amer: 15 mL/min — ABNORMAL LOW (ref >60)
Glucose, Bld: 327 mg/dL — ABNORMAL HIGH (ref 70–99)
Potassium: 4.2 mmol/L (ref 3.5–5.1)
Sodium: 134 mmol/L — ABNORMAL LOW (ref 135–145)

## 2019-11-09 LAB — PHOSPHORUS
Phosphorus: 4.5 mg/dL (ref 2.5–4.6)
Phosphorus: 5.3 mg/dL — ABNORMAL HIGH (ref 2.5–4.6)

## 2019-11-09 LAB — GLUCOSE, CAPILLARY
Glucose-Capillary: 161 mg/dL — ABNORMAL HIGH (ref 70–99)
Glucose-Capillary: 260 mg/dL — ABNORMAL HIGH (ref 70–99)
Glucose-Capillary: 260 mg/dL — ABNORMAL HIGH (ref 70–99)
Glucose-Capillary: 261 mg/dL — ABNORMAL HIGH (ref 70–99)
Glucose-Capillary: 267 mg/dL — ABNORMAL HIGH (ref 70–99)
Glucose-Capillary: 279 mg/dL — ABNORMAL HIGH (ref 70–99)
Glucose-Capillary: 307 mg/dL — ABNORMAL HIGH (ref 70–99)

## 2019-11-09 LAB — CBC
HCT: 30 % — ABNORMAL LOW (ref 39.0–52.0)
Hemoglobin: 9 g/dL — ABNORMAL LOW (ref 13.0–17.0)
MCH: 30.4 pg (ref 26.0–34.0)
MCHC: 30 g/dL (ref 30.0–36.0)
MCV: 101.4 fL — ABNORMAL HIGH (ref 80.0–100.0)
Platelets: 198 10*3/uL (ref 150–400)
RBC: 2.96 MIL/uL — ABNORMAL LOW (ref 4.22–5.81)
RDW: 18.3 % — ABNORMAL HIGH (ref 11.5–15.5)
WBC: 10.7 10*3/uL — ABNORMAL HIGH (ref 4.0–10.5)
nRBC: 0.4 % — ABNORMAL HIGH (ref 0.0–0.2)

## 2019-11-09 LAB — COMPREHENSIVE METABOLIC PANEL
ALT: 340 U/L — ABNORMAL HIGH (ref 0–44)
AST: 71 U/L — ABNORMAL HIGH (ref 15–41)
Albumin: 2.4 g/dL — ABNORMAL LOW (ref 3.5–5.0)
Alkaline Phosphatase: 47 U/L (ref 38–126)
Anion gap: 16 — ABNORMAL HIGH (ref 5–15)
BUN: 65 mg/dL — ABNORMAL HIGH (ref 8–23)
CO2: 21 mmol/L — ABNORMAL LOW (ref 22–32)
Calcium: 8 mg/dL — ABNORMAL LOW (ref 8.9–10.3)
Chloride: 99 mmol/L (ref 98–111)
Creatinine, Ser: 3.07 mg/dL — ABNORMAL HIGH (ref 0.61–1.24)
GFR calc Af Amer: 22 mL/min — ABNORMAL LOW (ref >60)
GFR calc non Af Amer: 19 mL/min — ABNORMAL LOW (ref >60)
Glucose, Bld: 264 mg/dL — ABNORMAL HIGH (ref 70–99)
Potassium: 4.6 mmol/L (ref 3.5–5.1)
Sodium: 136 mmol/L (ref 135–145)
Total Bilirubin: 0.8 mg/dL (ref 0.3–1.2)
Total Protein: 6 g/dL — ABNORMAL LOW (ref 6.5–8.1)

## 2019-11-09 LAB — CULTURE, BLOOD (SINGLE)
Culture: NO GROWTH
Special Requests: ADEQUATE

## 2019-11-09 LAB — HEPARIN LEVEL (UNFRACTIONATED): Heparin Unfractionated: 0.35 IU/mL (ref 0.30–0.70)

## 2019-11-09 LAB — TROPONIN I (HIGH SENSITIVITY): Troponin I (High Sensitivity): 13823 ng/L (ref <18)

## 2019-11-09 LAB — MAGNESIUM
Magnesium: 2.2 mg/dL (ref 1.7–2.4)
Magnesium: 2.5 mg/dL — ABNORMAL HIGH (ref 1.7–2.4)

## 2019-11-09 LAB — BRAIN NATRIURETIC PEPTIDE: B Natriuretic Peptide: 1214.4 pg/mL — ABNORMAL HIGH (ref 0.0–100.0)

## 2019-11-09 MED ORDER — METOPROLOL TARTRATE 5 MG/5ML IV SOLN
2.5000 mg | Freq: Once | INTRAVENOUS | Status: AC
  Administered 2019-11-09: 2.5 mg via INTRAVENOUS
  Filled 2019-11-09: qty 5

## 2019-11-09 MED ORDER — PRO-STAT SUGAR FREE PO LIQD
30.0000 mL | Freq: Three times a day (TID) | ORAL | Status: DC
  Administered 2019-11-09 – 2019-11-11 (×6): 30 mL
  Filled 2019-11-09 (×6): qty 30

## 2019-11-09 MED ORDER — FUROSEMIDE 10 MG/ML IJ SOLN
60.0000 mg | Freq: Four times a day (QID) | INTRAMUSCULAR | Status: DC
  Administered 2019-11-09 – 2019-11-11 (×8): 60 mg via INTRAVENOUS
  Filled 2019-11-09 (×8): qty 6

## 2019-11-09 NOTE — Progress Notes (Signed)
Nutrition Follow-up  DOCUMENTATION CODES:   Obesity unspecified  INTERVENTION:   Tube feeding via OG tube: - Continue Vital High Protein @ 60 ml/hr (1440 ml/day) - Pro-stat 30 ml TID  Tube feeding regimen provides 1740 kcal, 171 grams of protein, and 1204 ml of H2O.  NUTRITION DIAGNOSIS:   Inadequate oral intake related to inability to eat as evidenced by NPO status.  Ongoing  GOAL:   Provide needs based on ASPEN/SCCM guidelines  Met via TF  MONITOR:   Vent status, Labs, Weight trends, TF tolerance, Skin, I & O's  REASON FOR ASSESSMENT:   Consult Enteral/tube feeding initiation and management  ASSESSMENT:   74 yo male admitted with L sided chest pain, NSTEMI, GIB. PMH includes CAD, CABG x 5, PAF, AAA, CKD IV, HTN, HLD, cocaine abuse, chronic hypotension, solitary kidney.  5/24 - s/p endoscopy with clips placed to oozing gastric polyps 5/25 - neuro consulted for AMS & aphagia, suspected to be caused by toxic metabolic encephalopathy 7/12 - progressive AKI on CKD, nephrology consulted 5/28 - V fib arrest, transferred to the ICU and intubated 5/30 - TF initiated  RD consulted for TF initiation and management.  OG tube in place with TF currently infusing. Discussed pt with RN who reports pt is tolerating TF without issue.  Admit weight: 123.7 kg Current weight: 128.5 kg  Pt with non-pitting edema to BLE. Will utilize weight of 123.7 kg as EDW.  Per Nephrology, no indication for dialysis today.  Current TF: Vital High Protein @ 60 ml/hr, Pro-stat 30 ml BID  Patient is currently intubated on ventilator support MV: 7.6 L/min Temp (24hrs), Avg:98.3 F (36.8 C), Min:97.7 F (36.5 C), Max:99.1 F (37.3 C) BP (cuff): 100/87 MAP (cuff): 93  Drips: Fentanyl: 10 ml/hr Heparin: 17.5 ml/hr NS: 10 ml/hr  Medications reviewed and include: colace, ferrous sulfate, Lasix, SSI q 4 hours, Levemir 25 units BID, protonix, miralax, senna, IV abx  Labs reviewed: BUN  65, creatinine 3.07, elevated LFTs, hemoglobin 9.0 CBG's: 179-279 x 24 hours  UOP: 1075 ml x 24 hours I/O's: +3.7 L since admit  NUTRITION - FOCUSED PHYSICAL EXAM:    Most Recent Value  Orbital Region  No depletion  Upper Arm Region  No depletion  Thoracic and Lumbar Region  No depletion  Buccal Region  Unable to assess  Temple Region  No depletion  Clavicle Bone Region  No depletion  Clavicle and Acromion Bone Region  No depletion  Scapular Bone Region  Unable to assess  Dorsal Hand  No depletion  Patellar Region  No depletion  Anterior Thigh Region  No depletion  Posterior Calf Region  No depletion  Edema (RD Assessment)  Mild [BLE]  Hair  Reviewed  Eyes  Unable to assess  Mouth  Unable to assess  Skin  Reviewed  Nails  Reviewed       Diet Order:   Diet Order    None      EDUCATION NEEDS:   Not appropriate for education at this time  Skin:  Skin Assessment: Reviewed RN Assessment (MASD to coccyx)  Last BM:  11/09/19 type 7  Height:   Ht Readings from Last 1 Encounters:  11/06/19 6' (1.829 m)    Weight:   Wt Readings from Last 1 Encounters:  11/09/19 128.5 kg    Ideal Body Weight:  80.9 kg  BMI:  Body mass index is 38.42 kg/m.  Estimated Nutritional Needs:   Kcal:  4580-9983  Protein:  >/=  162 gm  Fluid:  2 L    Gaynell Face, MS, RD, LDN Inpatient Clinical Dietitian Pager: [phone] Weekend/After Hours: [phone]

## 2019-11-09 NOTE — Progress Notes (Addendum)
NAME:  Matthew [name], MRN:  [id number], DOB:  10/28/1945, LOS: 37 ADMISSION DATE:  10/13/2019, CONSULTATION DATE:  11/06/19 REFERRING MD:  [name] - TRH, CHIEF COMPLAINT:  VFib arrest  Brief History   74 yo M admitted with NSTEMI, GIB, sustained VFib cardiac arrest on 5/28, with ROSC after approx 5 minutes. 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  Significant Hospital Events   5/20 admitted with chest pain, GIB. Hospital course complicated by worsening GIB, s/p endoscopy 5/24, with clips placed to oozing gastric polyps. Cardiology has deferred cardiac cath due to AKI on CKD, GIB for which eliquis has been held. Ongoing hypotensive episodes and ongoing complaints of chest pain.   5/25 neurology was consulted for aphagia with AMS. CT H without signs of acute CVA. Felt to be toxic metabolic encephalopathy.   5/27 nephrology consulted for progressive AKI on CKD with solitary kidney.   5/28 pt sustained a VFib arrest. Transferred to ICU   5/31 Converted to NSR  Consults:  Cardiology Neurology Nephrology PCCM   Procedures:  5/24 Endoscopy 5/28 ETT>>   Significant Diagnostic Tests:  CT a/p 5/26> small bilateral pleural effusions. Lower lobe atelectasis, possible PNA. R infra hepatic fat stranding. No  extraluminal gas or bowel wall thickening. Some stranding around midline pancreas  EGD/ Colonoscopy: 5/24 Oozing polyps, both were resected and clipped>> Needs follow up colonoscopy for small pre-cancerous polyps  Echo 5/25> LVEF 50-55%  TEE 5/28 > LVEF 35-40%, large amount of LAA thrombus, moderate MR, TR, moderate RV hypokinesis.  Micro Data:  Blood culture 5/26-staph species x 1  Urine culture 5/26-negative Blood cultures 5/28-negative MRSA PCR 5/29-negative  Antimicrobials:  Cefepime, end 5/28 5/28 Unasyn >>  Interim history/subjective:   Off pressors. On fentanyl, heparin gtt  Objective   Blood pressure 107/66, pulse 66, temperature 98.3 F (36.8 C), temperature source Axillary, resp. rate 17, height 6' (1.829 m), weight 128.5 kg, SpO2 100 %.    Vent Mode: PRVC FiO2 (%):  [40 %] 40 % Set Rate:  [16 bmp] 16 bmp Vt Set:  [620 mL] 620 mL PEEP:  [5 cmH20] 5 cmH20 Pressure Support:  [10 cmH20] 10 cmH20 Plateau Pressure:  [18 cmH20-24 cmH20] 19 cmH20   Intake/Output Summary (Last 24 hours) at 11/09/2019 1016 Last data filed at 11/09/2019 0900 Gross per 24 hour  Intake 2161.68 ml  Output 1175 ml  Net 986.68 ml   Filed Weights   11/06/19 0800 11/07/19 0446 11/09/19 0406  Weight: 131.7 kg 124.8 kg 128.5 kg    Examination:. Gen:      No acute distress, chronically ill appearing HEENT:  EOMI, sclera anicteric Neck:     No masses; no thyromegaly, ETT Lungs:    Clear to auscultation bilaterally; normal respiratory effort CV:         Regular rate and rhythm; no murmurs Abd:      + bowel sounds; soft, non-tender; no palpable masses, no distension Ext:    +  edema; adequate peripheral perfusion Skin:      Warm and dry; no rash Neuro: Sedated, arousable  Labs significant for glucose 264, BUN/creatinine 65/3.07, BNP 1214 AST 71, ALT 340 Chest x-ray 5/31-mild improvement in airspace disease, edema.  Resolved Hospital Problem list    Shock  Assessment & Plan:   Acute  respiratory failure with hypoxia in setting of cardiac arrest requiring intubation  Hx OSA Possible CAP on CT, at risk for aspiration PNA following arrest CXR + for Pulmonary edema  P Continue daily SBT's, Empiric Unasyn Increase Lasix for diuresis  VFib arrest -unclear if arrest etiology possibly r/t documented respiratory complaints prior vs primary cardiac etiology Troponins are climbing Hx. QTc prolongation P -Trend  cmp mag phos, trops, ckmb - appreciate cards assist>  TEE -- visualized clot in R atria  -Continue heparin drip - not candidate for cath per cards 2/2 renal and GI issues - Metoprolol added per cards for rate control 5/30 - QTc bedside monitoring - Avoid QTc prolongation medications   LAA thrombus -seen on bedside TEE by cards on 5/28 P -heparin gtt  NSTEMI CAD  Angina  P -cardiology following, not candidate for cath   Paroxysmal AF P -ICU monitoring  -heparin gtt - Metoprolol added 5/30  HTN HLD P -holding antihypertensives in setting of shock  AKI on CKD IV  -solitary kidney  -suspected AKI due to hypotension, now with ATN Creatinine slowly down trending P -nephrology following, no indication for RRT yet  ABLA due to GI Bleed, s/p endoscopy with gastric polyp clipping GERD GI following P - trend CBC, goal hgb> 8  - Monitor for obvious bleeding  AAA -3 x 3.2cm aneurysm of distal abdominal aorta P -op follow up imaging  -will need cocaine cessation counseling and compliance with BP medications   DM Hypoglycemic episodes, Currently on D10 P -SSI + basal - CBG's - Will start TF at trickle feeds  Hypothyroidism P -synthroid   Depression P -nortriptyline, zoloft, zyprexa, trazodone   Goals of care Discussed with wife on 5/31.  Prognosis is guarded Wife is agreeable for a time-limited trial of 48 hours to continue life support and reassess Recommended DNR status but she wants to talk to patient's sister before making a  decision.  Best practice:  Diet: NPO/ TF Pain/Anxiety/Delirium protocol (if indicated): Prn fent VAP protocol (if indicated): yes  DVT prophylaxis: heparin gtt GI prophylaxis: protonix  Glucose control: SSI + basal  Mobility: BR Code Status: Full  Family Communication: Wife updated 5/31 Disposition: ICU   Critical care time:  35 min    The patient is critically ill with multiple organ system failure and requires high complexity decision making for assessment and support, frequent evaluation and titration of therapies, advanced monitoring, review of radiographic studies and interpretation of complex data.   Critical Care Time devoted to patient care services, exclusive of separately billable procedures, described in this note is 35 minutes.   Marshell Garfinkel MD  Pulmonary and Critical Care Please see [email or web address] for pager details.  11/09/2019, 10:45 AM

## 2019-11-09 NOTE — Progress Notes (Signed)
[facility] NEPHROLOGY PROGRESS NOTE  Assessment/ Plan: Pt is a 74 y.o. yo male with history of hypertension, CAD status post CABG, HLD, A. fib, CKD presented with chest pain found to have GI bleed, elevated troponin and AKI on CKD.  # AKI on CKD 4:  Baseline creatinine 2-2.2, solitary kidney.  Had bleeding with some degree of shock likely causing ATN however work-up has been reassuring and no other obvious signs of insult.  Renal function was improving but worsened in the setting of cardiac arrest on 5/28.   Fortunately, the creatinine level is improving and urine output is decent with IV diuretics.  Plan to continue IV Lasix today.  Continue to monitor renal function.  No indication for dialysis.  #V. fib arrest, and NSTEMI, LAA thrombus: Currently on IV heparin with plan to transition to NOAC once he is extubated and is started taking orally.  Cardiology is following.  Not planning for cardiac cath.  #Shock, improved:   Off of pressors, on midodrine.  Blood pressure acceptable.  #Anemia: acute blood loss s/p resection of gastric polyps. Transfusion per primary.   # Hypokalemia:  Improved.  #Hyponatremia, resolved: Sodium improving with urine output to 136 today.  Continue to monitor.  Discussed with ICU team.  Subjective: Seen and examined in ICU.  Remains on ventilator.  No pressure.  Some trace edema.  Urine output of 1075 cc.  No new event. Objective Vital signs in last 24 hours: Vitals:   11/09/19 0700 11/09/19 0800 11/09/19 0802 11/09/19 0900  BP: 101/69 107/70  107/66  Pulse: 66 66  66  Resp: 16 15  17   Temp:  98.3 F (36.8 C)    TempSrc:  Axillary    SpO2: 100% 100% 100% 100%  Weight:      Height:       Weight change:   Intake/Output Summary (Last 24 hours) at 11/09/2019 0958 Last data filed at 11/09/2019 0900 Gross per 24 hour  Intake 2209.23 ml  Output 1175 ml  Net 1034.23 ml       Labs: Basic Metabolic Panel: Recent Labs  Lab  11/06/19 0320 11/06/19 0824 11/07/19 1520 11/08/19 0154 11/08/19 1600 11/09/19 0219  NA 129*   < > 133* 135  --  136  K 4.7   < > 4.5 4.0  --  4.6  CL 93*   < > 98 97*  --  99  CO2 20*   < > 21* 22  --  21*  GLUCOSE 201*   < > 120* 163*  --  264*  BUN 54*   < > 62* 59*  --  65*  CREATININE 3.85*   < > 3.43* 3.29*  --  3.07*  CALCIUM 7.8*   < > 7.7* 8.0*  --  8.0*  PHOS 5.9*  --   --   --  5.1* 4.5   < > = values in this interval not displayed.   Liver Function Tests: Recent Labs  Lab 11/06/19 0824 11/07/19 0215 11/09/19 0219  AST 536* 317* 71*  ALT 1,035* 843* 340*  ALKPHOS 38 40 47  BILITOT 0.6 0.9 0.8  PROT 6.3* 6.1* 6.0*  ALBUMIN 3.0* 2.7* 2.4*   Recent Labs  Lab 11/04/2019 1015  LIPASE 14   Recent Labs  Lab 11/03/19 1642  AMMONIA 20   CBC: Recent Labs  Lab 11/04/19 0343 11/04/19 0343 10/26/2019 0725 10/22/2019 0725 11/06/19 0320 11/06/19 0320 11/06/19 0093 11/06/19 1806 11/07/19 0215 11/08/19 0154  11/09/19 0219  WBC 14.5*   < > 10.1   < > 16.8*   < > 16.4*  --  9.7 11.3* 10.7*  NEUTROABS 12.0*  --  7.4  --  12.9*  --   --   --   --   --   --   HGB 9.5*   < > 8.6*   < > 9.5*   < > 9.1*   < > 9.3* 9.3* 9.0*  HCT 32.1*   < > 28.4*   < > 30.0*   < > 29.6*   < > 29.0* 30.1* 30.0*  MCV 98.5   < > 96.3   < > 97.4  --  96.4  --  94.8 97.1 101.4*  PLT 361   < > 263   < > 330   < > 307  --  235 227 198   < > = values in this interval not displayed.   Cardiac Enzymes: Recent Labs  Lab 11/06/19 0827  CKTOTAL 171  CKMB 9.7*   CBG: Recent Labs  Lab 11/08/19 1544 11/08/19 1954 11/09/19 0020 11/09/19 0400 11/09/19 0802  GLUCAP 203* 210* 261* 260* 279*    Iron Studies: No results for input(s): IRON, TIBC, TRANSFERRIN, FERRITIN in the last 72 hours. Studies/Results: DG CHEST PORT 1 VIEW  Result Date: 11/09/2019 CLINICAL DATA:  Acute respiratory failure EXAM: PORTABLE CHEST 1 VIEW COMPARISON:  Chest radiograph from one day prior. FINDINGS: Endotracheal  tube tip is 3.9 cm above the carina. Enteric tube enters stomach with the tip not seen on this image. Stable configuration of median sternotomy wires with multiple discontinuities. Stable cardiomediastinal silhouette with moderate cardiomegaly. No pneumothorax. Small bilateral pleural effusions, mildly decreased bilaterally. Mild-to-moderate pulmonary edema, improved. Stable bibasilar atelectasis. IMPRESSION: 1. Well-positioned support structures. 2. Mild-to-moderate congestive heart failure, improved. 3. Small bilateral pleural effusions, mildly decreased bilaterally. 4. Stable bibasilar atelectasis. Electronically Signed   By: Ilona Sorrel M.D.   On: 11/09/2019 06:03   DG CHEST PORT 1 VIEW  Result Date: 11/08/2019 CLINICAL DATA:  Pulmonary edema. EXAM: PORTABLE CHEST 1 VIEW COMPARISON:  Nov 07, 2019 FINDINGS: Stable cardiomegaly. Bilateral pulmonary opacities worrisome for pulmonary edema given history, worsened in the interval. The ETT is in good position. The NG tube terminates below today's film. No other interval changes. IMPRESSION: 1. Cardiomegaly and bilateral pulmonary opacities favored to represent pulmonary edema given history, worsened in the interval. Support apparatus as above. Electronically Signed   By: Dorise Bullion III M.D   On: 11/08/2019 13:37    Medications: Infusions: . sodium chloride 10 mL/hr at 11/09/19 0900  . ampicillin-sulbactam (UNASYN) IV 3 g (11/09/19 0944)  . dextrose Stopped (11/08/19 2016)  . fentaNYL infusion INTRAVENOUS 100 mcg/hr (11/09/19 0947)  . heparin 1,750 Units/hr (11/09/19 0940)  . norepinephrine (LEVOPHED) Adult infusion Stopped (11/06/19 1623)    Scheduled Medications: . sodium chloride   Intravenous Once  . chlorhexidine gluconate (MEDLINE KIT)  15 mL Mouth Rinse BID  . Chlorhexidine Gluconate Cloth  6 each Topical Daily  . clopidogrel  75 mg Per Tube Daily  . docusate  100 mg Per Tube BID  . feeding supplement (PRO-STAT SUGAR FREE 64)  30 mL  Per Tube TID  . feeding supplement (VITAL HIGH PROTEIN)  1,000 mL Per Tube Q24H  . ferrous sulfate  300 mg Per Tube Q M,W,F  . finasteride  5 mg Oral QHS  . fluticasone  2 spray Each Nare Daily  .  furosemide  60 mg Intravenous Q12H  . insulin aspart  0-15 Units Subcutaneous Q4H  . insulin detemir  25 Units Subcutaneous BID  . levothyroxine  150 mcg Per Tube QAC breakfast  . mouth rinse  15 mL Mouth Rinse 10 times per day  . melatonin  3 mg Per Tube QHS  . metoprolol tartrate  12.5 mg Per Tube BID  . midodrine  10 mg Per Tube TID WC  . nortriptyline  50 mg Per Tube QHS  . pantoprazole sodium  40 mg Per Tube BID  . polyethylene glycol  17 g Per Tube Daily  . senna  2 tablet Per Tube q AM   And  . senna  1 tablet Per Tube QHS  . sertraline  25 mg Per Tube [id number]  . sodium bicarbonate  50 mEq Intravenous Once  . sodium chloride flush  3 mL Intravenous Q12H  . traZODone  100 mg Per Tube QHS    have reviewed scheduled and prn medications.  Physical Exam: General: Ill-looking male, sedated and intubated Heart:RRR, s1s2 nl, no rubs Lungs: Coarse breath sound bilateral, no wheeze Abdomen:soft, Non-tender, non-distended Extremities: Trace LE edema Neurology: Intubated  Rosita Fire 11/09/2019,9:58 AM  LOS: 14 days  Pager: [phone]

## 2019-11-09 NOTE — Progress Notes (Signed)
DAILY PROGRESS NOTE   Patient Name: Matthew [name] Date of Encounter: 11/09/2019 Cardiologist: Larae Grooms, MD  Chief Complaint   Intubated, lightly sedated  Patient Profile   Matthew [name] is a 74 y.o. male with a hx of coronary artery disease, CABG, hypertension, hyperlipidemia, paroxysmal atrial fibrillation, hypothyroidism, CKD who presents with chest pain and was found to have a  GI bleed and mildly elevated troponins .  Who is being seen today for the evaluation of  Elevated troponins  at the request of  Dr. Tamala Julian.  Subjective   Converted to sinus rhythm around 3am - maintaining, with occasional PVC's. Lightly sedated today - apparently was agitated, but more alert yesterday.  Objective   Vitals:   11/09/19 0406 11/09/19 0500 11/09/19 0600 11/09/19 0700  BP:  101/69 96/66 101/69  Pulse:  66 66 66  Resp:  _0 Temp:      TempSrc:      SpO2:  100% 100% 100%  Weight: 128.5 kg     Height:        Intake/Output Summary (Last 24 hours) at 11/09/2019 0802 Last data filed at 11/09/2019 0700 Gross per 24 hour  Intake 2073.3 ml  Output 1075 ml  Net 998.3 ml   Filed Weights   11/06/19 0800 11/07/19 0446 11/09/19 0406  Weight: 131.7 kg 124.8 kg 128.5 kg    Physical Exam   General appearance: intubated, lightly sedated vent, mitts in place Neck: no carotid bruit, no JVD and thyroid not enlarged, symmetric, no tenderness/mass/nodules Lungs: diminished breath sounds bilaterally Heart: irregularly irregular rhythm Abdomen: soft, non-tender; bowel sounds normal; no masses,  no organomegaly Extremities: extremities normal, atraumatic, no cyanosis or edema Pulses: 2+ and symmetric Skin: cool, dry Neurologic: Mental status: lightly sedated, opens eyes to voice Psych: Cannot assess  Inpatient Medications    Scheduled Meds: . sodium chloride   Intravenous Once  . chlorhexidine gluconate (MEDLINE KIT)  15 mL Mouth Rinse BID  . Chlorhexidine Gluconate  Cloth  6 each Topical Daily  . clopidogrel  75 mg Per Tube Daily  . docusate  100 mg Per Tube BID  . feeding supplement (PRO-STAT SUGAR FREE 64)  30 mL Per Tube BID  . feeding supplement (VITAL HIGH PROTEIN)  1,000 mL Per Tube Q24H  . ferrous sulfate  300 mg Per Tube Q M,W,F  . finasteride  5 mg Oral QHS  . fluticasone  2 spray Each Nare Daily  . furosemide  60 mg Intravenous Q12H  . insulin aspart  0-15 Units Subcutaneous Q4H  . insulin detemir  25 Units Subcutaneous BID  . levothyroxine  150 mcg Per Tube QAC breakfast  . mouth rinse  15 mL Mouth Rinse 10 times per day  . melatonin  3 mg Per Tube QHS  . metoprolol tartrate  12.5 mg Per Tube BID  . midodrine  10 mg Per Tube TID WC  . nortriptyline  50 mg Per Tube QHS  . pantoprazole sodium  40 mg Per Tube BID  . polyethylene glycol  17 g Per Tube Daily  . senna  2 tablet Per Tube q AM   And  . senna  1 tablet Per Tube QHS  . sertraline  25 mg Per Tube [id number]  . sodium bicarbonate  50 mEq Intravenous Once  . sodium chloride flush  3 mL Intravenous Q12H  . traZODone  100 mg Per Tube QHS    Continuous Infusions: . sodium chloride 10  mL/hr at 11/09/19 0700  . ampicillin-sulbactam (UNASYN) IV Stopped (11/08/19 2145)  . dextrose Stopped (11/08/19 2016)  . fentaNYL infusion INTRAVENOUS 100 mcg/hr (11/09/19 0700)  . heparin 1,750 Units/hr (11/09/19 0700)  . norepinephrine (LEVOPHED) Adult infusion Stopped (11/06/19 1623)    PRN Meds: acetaminophen, alum & mag hydroxide-simeth **AND** lidocaine, fentaNYL, HYDROmorphone (DILAUDID) injection, lidocaine, midazolam, nitroGLYCERIN, OLANZapine zydis, polyethylene glycol, simethicone, sodium chloride, triamcinolone cream   Labs   Results for orders placed or performed during the hospital encounter of 11/08/2019 (from the past 48 hour(s))  Heparin level (unfractionated)     Status: Abnormal   Collection Time: 11/07/19 10:57 AM  Result Value Ref Range   Heparin Unfractionated 0.29 (L) 0.30  - 0.70 IU/mL    Comment: (NOTE) If heparin results are below expected values, and patient dosage has  been confirmed, suggest follow up testing of antithrombin III levels. Performed at [facility], [place] [address], [place], [place] [address]   Glucose, capillary     Status: None   Collection Time: 11/07/19 11:55 AM  Result Value Ref Range   Glucose-Capillary 98 70 - 99 mg/dL    Comment: Glucose reference range applies only to samples taken after fasting for at least 8 hours.  Basic metabolic panel     Status: Abnormal   Collection Time: 11/07/19  3:20 PM  Result Value Ref Range   Sodium 133 (L) 135 - 145 mmol/L   Potassium 4.5 3.5 - 5.1 mmol/L    Comment: SLIGHT HEMOLYSIS   Chloride 98 98 - 111 mmol/L   CO2 21 (L) 22 - 32 mmol/L   Glucose, Bld 120 (H) 70 - 99 mg/dL    Comment: Glucose reference range applies only to samples taken after fasting for at least 8 hours.   BUN 62 (H) 8 - 23 mg/dL   Creatinine, Ser 3.43 (H) 0.61 - 1.24 mg/dL   Calcium 7.7 (L) 8.9 - 10.3 mg/dL   GFR calc non Af Amer 17 (L) >60 mL/min   GFR calc Af Amer 19 (L) >60 mL/min   Anion gap 14 5 - 15    Comment: Performed at [facility] [address] [place], [place] [address]  Magnesium     Status: None   Collection Time: 11/07/19  3:20 PM  Result Value Ref Range   Magnesium 2.4 1.7 - 2.4 mg/dL    Comment: Performed at [facility], [place] [address] [place], [place] [address]  Glucose, capillary     Status: Abnormal   Collection Time: 11/07/19  3:23 PM  Result Value Ref Range   Glucose-Capillary 124 (H) 70 - 99 mg/dL    Comment: Glucose reference range applies only to samples taken after fasting for at least 8 hours.  Heparin level (unfractionated)     Status: None   Collection Time: 11/07/19  6:16 PM  Result Value Ref Range   Heparin Unfractionated 0.41 0.30 - 0.70 IU/mL    Comment: (NOTE) If heparin results are below expected values, and patient dosage has  been confirmed, suggest  follow up testing of antithrombin III levels. Performed at [facility], [place] [address] [place], [place] [address]   MRSA PCR Screening     Status: None   Collection Time: 11/07/19  7:46 PM   Specimen: Nasopharyngeal  Result Value Ref Range   MRSA by PCR NEGATIVE NEGATIVE    Comment:        The GeneXpert MRSA Assay (  FDA approved for NASAL specimens only), is one component of a comprehensive MRSA colonization surveillance program. It is not intended to diagnose MRSA infection nor to guide or monitor treatment for MRSA infections. Performed at [facility], [place] [address], [place], [place] [address]   Glucose, capillary     Status: Abnormal   Collection Time: 11/07/19  8:00 PM  Result Value Ref Range   Glucose-Capillary 152 (H) 70 - 99 mg/dL    Comment: Glucose reference range applies only to samples taken after fasting for at least 8 hours.   Comment 1 Notify RN   Glucose, capillary     Status: Abnormal   Collection Time: 11/08/19 12:04 AM  Result Value Ref Range   Glucose-Capillary 153 (H) 70 - 99 mg/dL    Comment: Glucose reference range applies only to samples taken after fasting for at least 8 hours.   Comment 1 Notify RN   Procalcitonin     Status: None   Collection Time: 11/08/19  1:54 AM  Result Value Ref Range   Procalcitonin 0.44 ng/mL    Comment:        Interpretation: PCT (Procalcitonin) <= 0.5 ng/mL: Systemic infection (sepsis) is not likely. Local bacterial infection is possible. (NOTE)       Sepsis PCT Algorithm           Lower Respiratory Tract                                      Infection PCT Algorithm    ----------------------------     ----------------------------         PCT < 0.25 ng/mL                PCT < 0.10 ng/mL         Strongly encourage             Strongly discourage   discontinuation of antibiotics    initiation of antibiotics    ----------------------------     -----------------------------       PCT 0.25 - 0.50 ng/mL             PCT 0.10 - 0.25 ng/mL               OR       >80% decrease in PCT            Discourage initiation of                                            antibiotics      Encourage discontinuation           of antibiotics    ----------------------------     -----------------------------         PCT >= 0.50 ng/mL              PCT 0.26 - 0.50 ng/mL               AND        <80% decrease in PCT             Encourage initiation of  antibiotics       Encourage continuation           of antibiotics    ----------------------------     -----------------------------        PCT >= 0.50 ng/mL                  PCT > 0.50 ng/mL               AND         increase in PCT                  Strongly encourage                                      initiation of antibiotics    Strongly encourage escalation           of antibiotics                                     -----------------------------                                           PCT <= 0.25 ng/mL                                                 OR                                        > 80% decrease in PCT                                     Discontinue / Do not initiate                                             antibiotics Performed at [facility], [address] [place], [place] [address]   CBC     Status: Abnormal   Collection Time: 11/08/19  1:54 AM  Result Value Ref Range   WBC 11.3 (H) 4.0 - 10.5 K/uL   RBC 3.10 (L) 4.22 - 5.81 MIL/uL   Hemoglobin 9.3 (L) 13.0 - 17.0 g/dL   HCT 30.1 (L) 39.0 - 52.0 %   MCV 97.1 80.0 - 100.0 fL   MCH 30.0 26.0 - 34.0 pg   MCHC 30.9 30.0 - 36.0 g/dL   RDW 18.1 (H) 11.5 - 15.5 %   Platelets 227 150 - 400 K/uL   nRBC 0.3 (H) 0.0 - 0.2 %    Comment: Performed at [facility], [place] [address] [place], [place] [address]  Heparin level (unfractionated)     Status: None   Collection Time: 11/08/19  1:54 AM  Result Value Ref Range   Heparin  Unfractionated 0.45 0.30 - 0.70 IU/mL  Comment: (NOTE) If heparin results are below expected values, and patient dosage has  been confirmed, suggest follow up testing of antithrombin III levels. Performed at [facility], [place] [address] [place], [place] [address]   Basic metabolic panel     Status: Abnormal   Collection Time: 11/08/19  1:54 AM  Result Value Ref Range   Sodium 135 135 - 145 mmol/L   Potassium 4.0 3.5 - 5.1 mmol/L   Chloride 97 (L) 98 - 111 mmol/L   CO2 22 22 - 32 mmol/L   Glucose, Bld 163 (H) 70 - 99 mg/dL    Comment: Glucose reference range applies only to samples taken after fasting for at least 8 hours.   BUN 59 (H) 8 - 23 mg/dL   Creatinine, Ser 3.29 (H) 0.61 - 1.24 mg/dL   Calcium 8.0 (L) 8.9 - 10.3 mg/dL   GFR calc non Af Amer 17 (L) >60 mL/min   GFR calc Af Amer 20 (L) >60 mL/min   Anion gap 16 (H) 5 - 15    Comment: Performed at [facility] [address] [place], [place] [address]  Magnesium     Status: None   Collection Time: 11/08/19  1:54 AM  Result Value Ref Range   Magnesium 2.2 1.7 - 2.4 mg/dL    Comment: Performed at [facility] [address] [place], [place] [address]  Glucose, capillary     Status: Abnormal   Collection Time: 11/08/19  4:06 AM  Result Value Ref Range   Glucose-Capillary 152 (H) 70 - 99 mg/dL    Comment: Glucose reference range applies only to samples taken after fasting for at least 8 hours.   Comment 1 Notify RN   Glucose, capillary     Status: Abnormal   Collection Time: 11/08/19  8:03 AM  Result Value Ref Range   Glucose-Capillary 148 (H) 70 - 99 mg/dL    Comment: Glucose reference range applies only to samples taken after fasting for at least 8 hours.  Glucose, capillary     Status: Abnormal   Collection Time: 11/08/19 11:35 AM  Result Value Ref Range   Glucose-Capillary 179 (H) 70 - 99 mg/dL    Comment: Glucose reference range applies only to samples taken after fasting for at least 8 hours.   Brain natriuretic peptide     Status: Abnormal   Collection Time: 11/08/19 12:50 PM  Result Value Ref Range   B Natriuretic Peptide 1,138.4 (H) 0.0 - 100.0 pg/mL    Comment: Performed at [facility] [address], [place], [place] [address]  Troponin I (High Sensitivity)     Status: Abnormal   Collection Time: 11/08/19 12:50 PM  Result Value Ref Range   Troponin I (High Sensitivity) 11,558 (HH) <18 ng/L    Comment: CRITICAL VALUE NOTED.  VALUE IS CONSISTENT WITH PREVIOUSLY REPORTED AND CALLED VALUE. (NOTE) Elevated high sensitivity troponin I (hsTnI) values and significant  changes across serial measurements may suggest ACS but many other  chronic and acute conditions are known to elevate hsTnI results.  Refer to the Links section for chest pain algorithms and additional  guidance. Performed at [facility], [place] [address] [place], [place] [address]   Glucose, capillary     Status: Abnormal   Collection Time: 11/08/19  3:44 PM  Result Value Ref Range   Glucose-Capillary 203 (H) 70 - 99 mg/dL    Comment: Glucose reference range applies  only to samples taken after fasting for at least 8 hours.  Magnesium     Status: None   Collection Time: 11/08/19  4:00 PM  Result Value Ref Range   Magnesium 2.2 1.7 - 2.4 mg/dL    Comment: Performed at [facility], [place] [address], [place], [place] [address]  Phosphorus     Status: Abnormal   Collection Time: 11/08/19  4:00 PM  Result Value Ref Range   Phosphorus 5.1 (H) 2.5 - 4.6 mg/dL    Comment: Performed at [facility] [address], [place], [place] [address]  Troponin I (High Sensitivity)     Status: Abnormal   Collection Time: 11/08/19  4:00 PM  Result Value Ref Range   Troponin I (High Sensitivity) 11,652 (HH) <18 ng/L    Comment: CRITICAL VALUE NOTED.  VALUE IS CONSISTENT WITH PREVIOUSLY REPORTED AND CALLED VALUE. (NOTE) Elevated high sensitivity troponin I (hsTnI) values and significant  changes  across serial measurements may suggest ACS but many other  chronic and acute conditions are known to elevate hsTnI results.  Refer to the Links section for chest pain algorithms and additional  guidance. Performed at [facility], [place] [address], [place], [place] [address]   Glucose, capillary     Status: Abnormal   Collection Time: 11/08/19  7:54 PM  Result Value Ref Range   Glucose-Capillary 210 (H) 70 - 99 mg/dL    Comment: Glucose reference range applies only to samples taken after fasting for at least 8 hours.   Comment 1 Notify RN   Glucose, capillary     Status: Abnormal   Collection Time: 11/09/19 12:20 AM  Result Value Ref Range   Glucose-Capillary 261 (H) 70 - 99 mg/dL    Comment: Glucose reference range applies only to samples taken after fasting for at least 8 hours.   Comment 1 Notify RN   CBC     Status: Abnormal   Collection Time: 11/09/19  2:19 AM  Result Value Ref Range   WBC 10.7 (H) 4.0 - 10.5 K/uL   RBC 2.96 (L) 4.22 - 5.81 MIL/uL   Hemoglobin 9.0 (L) 13.0 - 17.0 g/dL   HCT 30.0 (L) 39.0 - 52.0 %   MCV 101.4 (H) 80.0 - 100.0 fL   MCH 30.4 26.0 - 34.0 pg   MCHC 30.0 30.0 - 36.0 g/dL   RDW 18.3 (H) 11.5 - 15.5 %   Platelets 198 150 - 400 K/uL   nRBC 0.4 (H) 0.0 - 0.2 %    Comment: Performed at [facility], [place] [address], [place], [place] [address]  Heparin level (unfractionated)     Status: None   Collection Time: 11/09/19  2:19 AM  Result Value Ref Range   Heparin Unfractionated 0.35 0.30 - 0.70 IU/mL    Comment: (NOTE) If heparin results are below expected values, and patient dosage has  been confirmed, suggest follow up testing of antithrombin III levels. Performed at [facility], [place] [address] [place], [place] [address]   Brain natriuretic peptide     Status: Abnormal   Collection Time: 11/09/19  2:19 AM  Result Value Ref Range   B Natriuretic Peptide 1,214.4 (H) 0.0 - 100.0 pg/mL    Comment: Performed at [facility] [address], [place], [place] [address]  Magnesium     Status: None   Collection Time: 11/09/19  2:19 AM  Result Value Ref Range  Magnesium 2.2 1.7 - 2.4 mg/dL    Comment: Performed at [facility], [place] [address], [place], [place] [address]  Phosphorus     Status: None   Collection Time: 11/09/19  2:19 AM  Result Value Ref Range   Phosphorus 4.5 2.5 - 4.6 mg/dL    Comment: Performed at [facility] [address] [place], [place] [address]  Comprehensive metabolic panel     Status: Abnormal   Collection Time: 11/09/19  2:19 AM  Result Value Ref Range   Sodium 136 135 - 145 mmol/L   Potassium 4.6 3.5 - 5.1 mmol/L   Chloride 99 98 - 111 mmol/L   CO2 21 (L) 22 - 32 mmol/L   Glucose, Bld 264 (H) 70 - 99 mg/dL    Comment: Glucose reference range applies only to samples taken after fasting for at least 8 hours.   BUN 65 (H) 8 - 23 mg/dL   Creatinine, Ser 3.07 (H) 0.61 - 1.24 mg/dL   Calcium 8.0 (L) 8.9 - 10.3 mg/dL   Total Protein 6.0 (L) 6.5 - 8.1 g/dL   Albumin 2.4 (L) 3.5 - 5.0 g/dL   AST 71 (H) 15 - 41 U/L   ALT 340 (H) 0 - 44 U/L   Alkaline Phosphatase 47 38 - 126 U/L   Total Bilirubin 0.8 0.3 - 1.2 mg/dL   GFR calc non Af Amer 19 (L) >60 mL/min   GFR calc Af Amer 22 (L) >60 mL/min   Anion gap 16 (H) 5 - 15    Comment: Performed at [facility], [place] [address], [place], [place] [address]  Glucose, capillary     Status: Abnormal   Collection Time: 11/09/19  4:00 AM  Result Value Ref Range   Glucose-Capillary 260 (H) 70 - 99 mg/dL    Comment: Glucose reference range applies only to samples taken after fasting for at least 8 hours.   Comment 1 Notify RN     ECG   N/A  Telemetry   Afib with RVR (briefly sinus this am with scooped ST depression, likely repolarization changes)- Personally Reviewed  Radiology    DG CHEST PORT 1 VIEW  Result Date: 11/09/2019 CLINICAL DATA:  Acute respiratory failure EXAM: PORTABLE CHEST 1 VIEW COMPARISON:  Chest  radiograph from one day prior. FINDINGS: Endotracheal tube tip is 3.9 cm above the carina. Enteric tube enters stomach with the tip not seen on this image. Stable configuration of median sternotomy wires with multiple discontinuities. Stable cardiomediastinal silhouette with moderate cardiomegaly. No pneumothorax. Small bilateral pleural effusions, mildly decreased bilaterally. Mild-to-moderate pulmonary edema, improved. Stable bibasilar atelectasis. IMPRESSION: 1. Well-positioned support structures. 2. Mild-to-moderate congestive heart failure, improved. 3. Small bilateral pleural effusions, mildly decreased bilaterally. 4. Stable bibasilar atelectasis. Electronically Signed   By: Ilona Sorrel M.D.   On: 11/09/2019 06:03   DG CHEST PORT 1 VIEW  Result Date: 11/08/2019 CLINICAL DATA:  Pulmonary edema. EXAM: PORTABLE CHEST 1 VIEW COMPARISON:  Nov 07, 2019 FINDINGS: Stable cardiomegaly. Bilateral pulmonary opacities worrisome for pulmonary edema given history, worsened in the interval. The ETT is in good position. The NG tube terminates below today's film. No other interval changes. IMPRESSION: 1. Cardiomegaly and bilateral pulmonary opacities favored to represent pulmonary edema given history, worsened in the interval. Support apparatus as above. Electronically Signed   By: Dorise Bullion III M.D   On: 11/08/2019 13:37    Cardiac Studies   TEE Bedside (11/06/2018)  Verbal  consent from wife Sedation Fentanyl and Precedex  Mild LVE EF 35-40% more diffuse hypokinesis slightly worse in inferior wall Moderate MR Moderate TR Moderate RV hypokinesis with mild dilatation Normal aorta with no dissection and minimal plaque No ASD/PFO No effusion  Large amount of LAA thrombus   Continue supportive care Lasix for CHF Amiodarone if needed for recurrent ventricular arrhythmia Start heparin for PAF and LAA thrombus  Vent management per CCM CXR does not look that bad in regard To CHF. ETT advance 4 cm  before TEE  Wife has been updated at length  Jenkins Rouge MD [facility]  Assessment   Principal Problem:   Chest pain Active Problems:   Hypothyroidism   PAF (paroxysmal atrial fibrillation) (HCC)   CKD (chronic kidney disease), stage III   AAA (abdominal aortic aneurysm) (HCC)   Diabetic polyneuropathy associated with type 2 diabetes mellitus (HCC)   OSA (obstructive sleep apnea)   Prolonged QT interval   Depression   GI bleed   Acute blood loss anemia   Hyperlipidemia LDL goal <70   AKI (acute kidney injury) ([place])   Blood in stool   Stage 4 chronic kidney disease (HCC)   Acute renal failure superimposed on stage 4 chronic kidney disease (HCC)   NSTEMI (non-ST elevated myocardial infarction) ([place])   Acute lower UTI   Respiratory arrest ([place])   Plan   1. Converted to sinus this am - continue heparin for LAA thrombus, plan transition to NOAC once he is extubated and taking po reliably. No acute plans for cath. BP remains  still on midodrine 10 mg TID. Low dose lopressor added yesterday for rate control/HF.  Time Spent Directly with Patient:  I have spent a total of 35 minutes with the patient reviewing hospital notes, telemetry, EKGs, labs and examining the patient as well as establishing an assessment and plan that was discussed personally with the patient.  > 50% of time was spent in direct patient care.  Length of Stay:  LOS: 14 days   Pixie Casino, MD, [facility], [place] Director of the Advanced Lipid Disorders &  Cardiovascular Risk Reduction Clinic Diplomate of the American Board of Clinical Lipidology Attending Cardiologist  Direct Dial: [phone]  Fax: [phone]  Website:  [email or web address] 11/09/2019, 8:02 AM

## 2019-11-09 NOTE — Progress Notes (Signed)
[facility] Progress Note Patient Name: Matthew [name] DOB: [birth year] MRN: [id number]   Date of Service  11/09/2019  HPI/Events of Note  AFIB with RVR - Ventricular rate = 135 - 150. BP = 125/82. Last Mg++ at about 4 PM = 2.5. QTc interval = 0.567 seconds per bedside nurse. Patient is on Metoprolol per tube.   eICU Interventions  Plan:  1. BMP STAT. 2. Metoprolol 2.5 mg IV X 1 now.  3. If HR not controlled, further management per cardiology consultant give prolong QTc.      Intervention Category Major Interventions: Arrhythmia - evaluation and management  Sommer,Steven Eugene 11/09/2019, 10:05 PM

## 2019-11-09 NOTE — Progress Notes (Signed)
Wife given update on patients status via phone. Appreciative of all the care her husband is receiving. Will call back if she has any other questions.

## 2019-11-09 NOTE — Progress Notes (Signed)
[place] for heparin Indication: chest pain/ACS and atrial fibrillation  Allergies  Allergen Reactions  . Amiodarone Other (See Comments)    Caused lung and liver toxicity  . Tetanus Toxoids Hives  . Morphine And Related Other (See Comments)    "made him crazy and heart was racing"    Patient Measurements: Height: 6' (182.9 cm) Weight: 128.5 kg (283 lb 4.7 oz) IBW/kg (Calculated) : 77.6 Heparin Dosing Weight: 107 kg  Vital Signs: Temp: 98.3 F (36.8 C) (05/31 0800) Temp Source: Axillary (05/31 0800) BP: 107/70 (05/31 0800) Pulse Rate: 66 (05/31 0800)  Labs: Recent Labs    11/06/19 1637 11/07/19 0056 11/07/19 0215 11/07/19 0215 11/07/19 1057 11/07/19 1520 11/07/19 1816 11/08/19 0154 11/08/19 1250 11/08/19 1600 11/09/19 0219  HGB   < >  --  9.3*   < >  --   --   --  9.3*  --   --  9.0*  HCT   < >  --  29.0*  --   --   --   --  30.1*  --   --  30.0*  PLT  --   --  235  --   --   --   --  227  --   --  198  HEPARINUNFRC   < >  --  0.12*  --    < >  --  0.41 0.45  --   --  0.35  CREATININE   < >  --  3.74*  --   --  3.43*  --  3.29*  --   --  3.07*  TROPONINIHS  --  5,904*  --   --   --   --   --   --  11,558* 11,652*  --    < > = values in this interval not displayed.    Estimated Creatinine Clearance: 28.8 mL/min (A) (by C-G formula based on SCr of 3.07 mg/dL (H)).  Assessment: 74 yo m with complicated hospital course, now with VFib arrest this am. Has had a GI bleed, NSTEMI, Code stroke called during stay.  Hx of apixaban PTA for afib  Had endoscopy on 5/24 with clipped polyps Bedside TEE shows left atrial appendage clot MRI brain ruled out CVA will increase HL goal back to 0.3-0.7  Heparin level therapeutic (0.35) on gtt at 1750 units/hr. No bleeding noted.  Goal of Therapy:  Heparin level 0.3-0.7 units/ml  Monitor platelets by anticoagulation protocol: Yes   Plan:  Continue heparin at 1750 units/hr Monitor  daily heparin level and CBC, s/sx bleeding  Nicoletta Dress, PharmD PGY2 Infectious Disease Pharmacy Resident  Please see amion for complete clinical pharmacist phone list 11/09/2019 9:11 AM

## 2019-11-10 ENCOUNTER — Inpatient Hospital Stay (HOSPITAL_COMMUNITY): Payer: 59

## 2019-11-10 LAB — GLUCOSE, CAPILLARY
Glucose-Capillary: 228 mg/dL — ABNORMAL HIGH (ref 70–99)
Glucose-Capillary: 268 mg/dL — ABNORMAL HIGH (ref 70–99)
Glucose-Capillary: 291 mg/dL — ABNORMAL HIGH (ref 70–99)
Glucose-Capillary: 298 mg/dL — ABNORMAL HIGH (ref 70–99)
Glucose-Capillary: 320 mg/dL — ABNORMAL HIGH (ref 70–99)
Glucose-Capillary: 332 mg/dL — ABNORMAL HIGH (ref 70–99)

## 2019-11-10 LAB — CBC
HCT: 29.4 % — ABNORMAL LOW (ref 39.0–52.0)
Hemoglobin: 9 g/dL — ABNORMAL LOW (ref 13.0–17.0)
MCH: 30.1 pg (ref 26.0–34.0)
MCHC: 30.6 g/dL (ref 30.0–36.0)
MCV: 98.3 fL (ref 80.0–100.0)
Platelets: 225 10*3/uL (ref 150–400)
RBC: 2.99 MIL/uL — ABNORMAL LOW (ref 4.22–5.81)
RDW: 18.3 % — ABNORMAL HIGH (ref 11.5–15.5)
WBC: 13.1 10*3/uL — ABNORMAL HIGH (ref 4.0–10.5)
nRBC: 0.3 % — ABNORMAL HIGH (ref 0.0–0.2)

## 2019-11-10 LAB — COMPREHENSIVE METABOLIC PANEL
ALT: 213 U/L — ABNORMAL HIGH (ref 0–44)
AST: 38 U/L (ref 15–41)
Albumin: 2.3 g/dL — ABNORMAL LOW (ref 3.5–5.0)
Alkaline Phosphatase: 47 U/L (ref 38–126)
Anion gap: 15 (ref 5–15)
BUN: 90 mg/dL — ABNORMAL HIGH (ref 8–23)
CO2: 22 mmol/L (ref 22–32)
Calcium: 8 mg/dL — ABNORMAL LOW (ref 8.9–10.3)
Chloride: 98 mmol/L (ref 98–111)
Creatinine, Ser: 3.7 mg/dL — ABNORMAL HIGH (ref 0.61–1.24)
GFR calc Af Amer: 17 mL/min — ABNORMAL LOW (ref >60)
GFR calc non Af Amer: 15 mL/min — ABNORMAL LOW (ref >60)
Glucose, Bld: 327 mg/dL — ABNORMAL HIGH (ref 70–99)
Potassium: 4.5 mmol/L (ref 3.5–5.1)
Sodium: 135 mmol/L (ref 135–145)
Total Bilirubin: 0.8 mg/dL (ref 0.3–1.2)
Total Protein: 6.2 g/dL — ABNORMAL LOW (ref 6.5–8.1)

## 2019-11-10 LAB — PHOSPHORUS: Phosphorus: 5 mg/dL — ABNORMAL HIGH (ref 2.5–4.6)

## 2019-11-10 LAB — HEPARIN LEVEL (UNFRACTIONATED): Heparin Unfractionated: 0.32 IU/mL (ref 0.30–0.70)

## 2019-11-10 LAB — MAGNESIUM: Magnesium: 2.2 mg/dL (ref 1.7–2.4)

## 2019-11-10 LAB — TROPONIN I (HIGH SENSITIVITY): Troponin I (High Sensitivity): 10784 ng/L (ref <18)

## 2019-11-10 LAB — BRAIN NATRIURETIC PEPTIDE: B Natriuretic Peptide: 1590 pg/mL — ABNORMAL HIGH (ref 0.0–100.0)

## 2019-11-10 MED ORDER — METOPROLOL TARTRATE 5 MG/5ML IV SOLN
2.5000 mg | Freq: Once | INTRAVENOUS | Status: AC
  Administered 2019-11-10: 2.5 mg via INTRAVENOUS
  Filled 2019-11-10: qty 5

## 2019-11-10 MED ORDER — INSULIN ASPART 100 UNIT/ML ~~LOC~~ SOLN
3.0000 [IU] | SUBCUTANEOUS | Status: DC
  Administered 2019-11-10 – 2019-11-11 (×6): 3 [IU] via SUBCUTANEOUS

## 2019-11-10 MED ORDER — METOPROLOL TARTRATE 25 MG/10 ML ORAL SUSPENSION
25.0000 mg | Freq: Two times a day (BID) | ORAL | Status: DC
  Administered 2019-11-10 – 2019-11-11 (×2): 25 mg
  Filled 2019-11-10 (×2): qty 10

## 2019-11-10 NOTE — Progress Notes (Addendum)
NAME:  Matthew [name], MRN:  [id number], DOB:  1945-08-22, LOS: 90 ADMISSION DATE:  10/20/2019, CONSULTATION DATE:  11/06/19 REFERRING MD:  Mansy - TRH, CHIEF COMPLAINT:  VFib arrest  Brief History   74yo m with PMHx of CAD s/p CABG, CKD IV, afib, HTN, HLD, hypothyroidism who presented with chest pain and was admitted with NSTEMI and GIB. Sustained VFib cardiac arrest on 5/28, with ROSC after approx 5 minutes. Intubated and transferred to ICU.  Past Medical History  - AAA - Afib - CAD - CKD - Cocaine use in remission - DM  - Depression - GERD - HTN, HLD - Lung cancer  Significant Hospital Events   5/20 - Admitted with chest pain, GIB. Hospital course complicated by worsening GIB, s/p endoscopy 5/24, with clips placed to oozing gastric polyps. Cardiology has deferred cardiac cath due to AKI on CKD, GIB for which eliquis has been held. Ongoing hypotensive episodes and ongoing complaints of chest pain.   5/25 - neurology was consulted for aphasia with AMS. CT H without signs of acute CVA. Thought to be toxic metabolic encephalopathy.   5/27 - nephrology consulted for progressive AKI on CKD with solitary kidney.   5/28 - pt sustained a VFib arrest. Transferred to ICU   5/31 - Converted to NSR  6/1 - In afib with RVR overnight. Stable, following commands  Consults:  Cardiology Neurology Nephrology PCCM   Procedures:  5/24 Endoscopy 5/28 ETT>>   Significant Diagnostic Tests:  CT a/p 5/26> small bilateral pleural effusions. Lower lobe atelectasis, possible PNA. R infra hepatic fat stranding. No extraluminal gas or bowel wall thickening. Some stranding around midline pancreas  EGD/ Colonoscopy: 5/24 Oozing polyps, both were resected and clipped>> Needs follow up colonoscopy for small pre-cancerous polyps  Echo 5/25> LVEF 50-55%  TEE 5/28 > LVEF 35-40%, large amount of LAA thrombus, moderate MR, TR, moderate RV hypokinesis.  MRI Brain 5/29 > Generalized atrophy and  chronic ischemic microangiopathy; no sign of acute stroke  CXR 6/1 > Bilateral pleural effusions that are unchanged/slightly improved from prior  Micro Data:  Blood culture 5/26-staph species x 1  Urine culture 5/26-negative Blood cultures 5/28-negative MRSA PCR 5/29-negative  Antimicrobials:  Cefepime, end 5/28 5/28 Unasyn >>  Interim history/subjective:   Continues to be on fentanyl and heparin gtt. Pt is tolerating 10/5 well on 40 of FiO2. Following commands.  Objective   Blood pressure (!) 110/95, pulse (!) 111, temperature (!) 97.4 F (36.3 C), temperature source Axillary, resp. rate 17, height 6' (1.829 m), weight 129.9 kg, SpO2 97 %.    Vent Mode: PRVC FiO2 (%):  [30 %-40 %] 30 % Set Rate:  [16 bmp] 16 bmp Vt Set:  [620 mL] 620 mL PEEP:  [5 cmH20-10 cmH20] 10 cmH20 Pressure Support:  [10 HFW26-37 cmH20] 10 cmH20 Plateau Pressure:  [18 cmH20-23 cmH20] 23 cmH20   Intake/Output Summary (Last 24 hours) at 11/10/2019 0934 Last data filed at 11/10/2019 0800 Gross per 24 hour  Intake 2054.59 ml  Output 1300 ml  Net 754.59 ml   Filed Weights   11/07/19 0446 11/09/19 0406 11/10/19 0500  Weight: 124.8 kg 128.5 kg 129.9 kg    Examination:. Gen:      No acute distress, chronically ill appearing, lying in bed HEENT:  EOMI, sclera anicteric Lungs:    Clear to auscultation bilaterally; normal respiratory effort CV:         Regular rate and rhythm; no murmurs Abd:  Active bowel sounds; soft, non-tender; no palpable masses, no distension Ext:    2+ pedal edema, strong pulses bilaterally Skin:      Warm and dry; no rash Neuro:  Arousable, responsive to commands, squeezes hand and moves toes  Labs significant for glucose 327, BUN/creatinine 90/3.7, BNP 1590 (<--1214), troponin 10,784 (<--13,000) AST 38, ALT 213  Resolved Hospital Problem list    Shock  Assessment & Plan:   Acute respiratory failure with hypoxia in setting of cardiac arrest requiring intubation  Hx  OSA Possible CAP on CT, at risk for aspiration PNA following arrest CXR + for Pulmonary edema  P - Attempt SBT pending goals of care discussion with wife - Empiric Unasyn - Lasix for diuresis  VFib arrest Unclear if arrest etiology possibly r/t documented respiratory complaints prior vs primary cardiac etiology. Troponins are downtrending. P -Trend  CMP, Mg, Phos, trops, ckmb - appreciate cards assist>  TEE -- visualized clot in R atria  - Continue heparin drip - Metoprolol for rate control - Amiodarone if recurrent arrhythmia - not candidate for cath per cards 2/2 renal and GI issues - QTc bedside monitoring - Avoid QTc prolongation medications   LAA thrombus seen on bedside TEE by cards on 5/28 P - Heparin gtt  NSTEMI CAD  Angina  P - cardiology following, not candidate for cath   Paroxysmal AF P - ICU monitoring  - heparin gtt - Metoprolol   HTN HLD P - holding antihypertensives in setting of shock  AKI on CKD IV Creatinine up today. Suspected prerenal AKI now with ATN component. Solitary kidney. P - nephrology following, no indication for RRT yet.   ABLA due to GI Bleed, s/p endoscopy with gastric polyp clipping GERD. GI following, stable Hb with no signs of frank bleeding. 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  Prognosis is guarded. Wife is agreeable for a time-limited trial of 48 hours to continue life support and reassess. Recommended DNR status but she wants to talk to patient's sister before making a decision. Will revisit conversation today to discuss goals  following extubation  Best practice:  Diet: TF (at goal) Pain/Anxiety/Delirium protocol (if indicated): Prn fentanyl VAP protocol (if indicated): yes  DVT prophylaxis: heparin gtt GI prophylaxis: protonix  Glucose control: SSI + basal  Mobility: BR Code Status: Full  Family Communication: Wife updated 5/31 Disposition: ICU   Critical care time:

## 2019-11-10 NOTE — Progress Notes (Signed)
[place] for heparin Indication: chest pain/ACS and atrial fibrillation  Allergies  Allergen Reactions  . Amiodarone Other (See Comments)    Caused lung and liver toxicity  . Tetanus Toxoids Hives  . Morphine And Related Other (See Comments)    "made him crazy and heart was racing"    Patient Measurements: Height: 6' (182.9 cm) Weight: 129.9 kg (286 lb 6 oz) IBW/kg (Calculated) : 77.6 Heparin Dosing Weight: 107 kg  Vital Signs: Temp: 97.4 F (36.3 C) (06/01 1100) Temp Source: Axillary (06/01 1100) BP: 119/69 (06/01 1200) Pulse Rate: 54 (06/01 1200)  Labs: Recent Labs    11/08/19 0154 11/08/19 0154 11/08/19 1250 11/08/19 1600 11/09/19 0219 11/09/19 1058 11/09/19 2225 11/10/19 0325 11/10/19 0551  HGB 9.3*   < >  --   --  9.0*  --   --  9.0*  --   HCT 30.1*  --   --   --  30.0*  --   --  29.4*  --   PLT 227  --   --   --  198  --   --  225  --   HEPARINUNFRC 0.45  --   --   --  0.35  --   --  0.32  --   CREATININE 3.29*   < >  --   --  3.07*  --  3.77* 3.70*  --   TROPONINIHS  --   --    < > 11,652*  --  47,159*  --   --  10,784*   < > = values in this interval not displayed.    Estimated Creatinine Clearance: 24 mL/min (A) (by C-G formula based on SCr of 3.7 mg/dL (H)).  Assessment: 74 yo m with complicated hospital course, now with VFib arrest this am. Has had a GI bleed, NSTEMI, Code stroke called during stay.  Hx of apixaban PTA for afib  Had endoscopy on 5/24 with clipped polyps Bedside TEE shows left atrial appendage clot MRI brain ruled out CVA will increase HL goal back to 0.3-0.7  Heparin level remains therapeutic. CBC wnl. No bleeding issues reported.  Goal of Therapy:  Heparin level 0.3-0.7 units/ml  Monitor platelets by anticoagulation protocol: Yes   Plan:  Continue heparin at 1750 units/hr Monitor daily heparin level and CBC, s/sx bleeding   Arturo Morton, PharmD, BCPS Please check AMION for all  [facility] contact numbers Clinical Pharmacist 11/10/2019 12:55 PM

## 2019-11-10 NOTE — Progress Notes (Signed)
° °  Notified by RN that patient with atrial fibrillation with RVR with rates up to 160s this evening. Blood pressures have been stable. Will give IV metoprolol 2.58m now and increase standing dose to 219mBID via tube with hold parameters.   KrAbigail ButtsPA-C 11/10/19; 7:39 PM

## 2019-11-10 NOTE — Progress Notes (Signed)
NAME:  Matthew [name], MRN:  [id number], DOB:  1946/04/17, LOS: 50 ADMISSION DATE:  10/17/2019, CONSULTATION DATE:  11/06/19 REFERRING MD:  Mansy - TRH, CHIEF COMPLAINT:  VFib arrest  Brief History   74yo m with PMHx of CAD s/p CABG, CKD IV, afib, HTN, HLD, hypothyroidism who presented with chest pain and was admitted with NSTEMI and GIB. Sustained VFib cardiac arrest on 5/28, with ROSC after approx 5 minutes. Intubated and transferred to ICU.  Past Medical History  - AAA - Afib - CAD - CKD - Cocaine use in remission - DM  - Depression - GERD - HTN, HLD - Lung cancer  Significant Hospital Events   5/20 - Admitted with chest pain, GIB. Hospital course complicated by worsening GIB, s/p endoscopy 5/24, with clips placed to oozing gastric polyps. Cardiology has deferred cardiac cath due to AKI on CKD, GIB for which eliquis has been held. Ongoing hypotensive episodes and ongoing complaints of chest pain.   5/25 - neurology was consulted for aphasia with AMS. CT H without signs of acute CVA. Thought to be toxic metabolic encephalopathy.   5/27 - nephrology consulted for progressive AKI on CKD with solitary kidney.   5/28 - pt sustained a VFib arrest. Transferred to ICU   5/31 - Converted to NSR  6/1 - In afib with RVR overnight. Stable, following commands  Consults:  Cardiology Neurology Nephrology PCCM   Procedures:  5/24 Endoscopy 5/28 ETT>>   Significant Diagnostic Tests:  CT a/p 5/26> small bilateral pleural effusions. Lower lobe atelectasis, possible PNA. R infra hepatic fat stranding. No extraluminal gas or bowel wall thickening. Some stranding around midline pancreas  EGD/ Colonoscopy: 5/24 Oozing polyps, both were resected and clipped>> Needs follow up colonoscopy for small pre-cancerous polyps  Echo 5/25> LVEF 50-55%  TEE 5/28 > LVEF 35-40%, large amount of LAA thrombus, moderate MR, TR, moderate RV hypokinesis.  MRI Brain 5/29 > Generalized atrophy and  chronic ischemic microangiopathy; no sign of acute stroke  CXR 6/1 > Bilateral pleural effusions that are unchanged/slightly improved from prior  Micro Data:  Blood culture 5/26-staph species x 1  Urine culture 5/26-negative Blood cultures 5/28-negative MRSA PCR 5/29-negative  Antimicrobials:  Cefepime, end 5/28 5/28 Unasyn >>  Interim history/subjective:   Continues to be on fentanyl and heparin gtt. Pt is tolerating 10/5 well on 40 of FiO2. Following commands.  Objective   Blood pressure 107/61, pulse 69, temperature (!) 97.4 F (36.3 C), temperature source Axillary, resp. rate 14, height 6' (1.829 m), weight 129.9 kg, SpO2 99 %.    Vent Mode: CPAP;PSV FiO2 (%):  [30 %-40 %] 40 % Set Rate:  [16 bmp] 16 bmp Vt Set:  [620 mL] 620 mL PEEP:  [5 cmH20-10 cmH20] 5 cmH20 Pressure Support:  [10 cmH20] 10 cmH20 Plateau Pressure:  [18 cmH20-23 cmH20] 23 cmH20   Intake/Output Summary (Last 24 hours) at 11/10/2019 1419 Last data filed at 11/10/2019 1400 Gross per 24 hour  Intake 1986.24 ml  Output 1840 ml  Net 146.24 ml   Filed Weights   11/07/19 0446 11/09/19 0406 11/10/19 0500  Weight: 124.8 kg 128.5 kg 129.9 kg    Examination:. Gen:      No acute distress, chronically ill appearing, lying in bed HEENT:  EOMI, sclera anicteric Lungs:    Clear to auscultation bilaterally; normal respiratory effort CV:         Regular rate and rhythm; no murmurs Abd:      Active bowel  sounds; soft, non-tender; no palpable masses, no distension Ext:    2+ pedal edema, strong pulses bilaterally Skin:      Warm and dry; no rash Neuro:  Arousable, responsive to commands, squeezes hand and moves toes  Labs significant for glucose 327, BUN/creatinine 90/3.7, BNP 1590 (<--1214), troponin 10,784 (<--13,000) AST 38, ALT 213  Resolved Hospital Problem list    Shock  Assessment & Plan:   Acute respiratory failure with hypoxia in setting of cardiac arrest requiring intubation  Hx OSA Possible CAP  on CT, at risk for aspiration PNA following arrest CXR + for Pulmonary edema  P - Attempt SBT pending goals of care discussion with wife - Empiric Unasyn - Lasix for diuresis  VFib arrest Unclear if arrest etiology possibly r/t documented respiratory complaints prior vs primary cardiac etiology. Troponins are downtrending. P -Trend  CMP, Mg, Phos, trops, ckmb - appreciate cards assist>  TEE -- visualized clot in R atria  - Continue heparin drip - Metoprolol for rate control - Amiodarone if recurrent arrhythmia - not candidate for cath per cards 2/2 renal and GI issues - QTc bedside monitoring - Avoid QTc prolongation medications   LAA thrombus seen on bedside TEE by cards on 5/28 P - Heparin gtt  NSTEMI CAD  Angina  P - cardiology following, not candidate for cath   Paroxysmal AF P - ICU monitoring  - heparin gtt - Metoprolol   HTN HLD P - holding antihypertensives in setting of shock  AKI on CKD IV Creatinine up today. Suspected prerenal AKI now with ATN component. Solitary kidney. P - nephrology following, no indication for RRT yet.   ABLA due to GI Bleed, s/p endoscopy with gastric polyp clipping GERD. GI following, stable Hb with no signs of frank bleeding. 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  Prognosis is guarded. Wife is agreeable for a time-limited trial of 48 hours to continue life support and reassess.  Called wife again on 6/1.  She does not want to extubate until she is at the bedside and with the patient.  She has not made a decision on the DNR yet and  will speak to the patient's sister regarding goals of care.  Best practice:  Diet: TF (at goal) Pain/Anxiety/Delirium protocol (if indicated): Prn fentanyl VAP protocol (if indicated): yes  DVT prophylaxis: heparin gtt GI prophylaxis: protonix  Glucose control: SSI + basal  Mobility: BR Code Status: Full  Family Communication: Wife updated 6/1 Disposition: ICU   Critical care time:       The patient is critically ill with multiple organ system failure and requires high complexity decision making for assessment and support, frequent evaluation and titration of therapies, advanced monitoring, review of radiographic studies and interpretation of complex data.   Critical Care Time devoted to patient care services, exclusive of separately billable procedures, described in this note is 35 minutes.   Marshell Garfinkel MD [facility] Please see [email or web address] for pager details.  11/10/2019, 2:19 PM

## 2019-11-10 NOTE — Plan of Care (Signed)
  Problem: Education: Goal: Knowledge of General Education information will improve Description: Including pain rating scale, medication(s)/side effects and non-pharmacologic comfort measures Outcome: Progressing   Problem: Education: Goal: Understanding of cardiac disease, CV risk reduction, and recovery process will improve Outcome: Progressing Goal: Individualized Educational Video(s) Outcome: Progressing   Problem: Activity: Goal: Ability to tolerate increased activity will improve Outcome: Progressing   Problem: Cardiac: Goal: Ability to achieve and maintain adequate cardiovascular perfusion will improve Outcome: Progressing   Problem: Health Behavior/Discharge Planning: Goal: Ability to safely manage health-related needs after discharge will improve Outcome: Progressing

## 2019-11-10 NOTE — Progress Notes (Signed)
DAILY PROGRESS NOTE   Patient Name: Matthew [name] Date of Encounter: 11/10/2019 Cardiologist: Larae Grooms, MD  Chief Complaint   Intubated, lightly sedated  Patient Profile   Matthew [name] is a 74 y.o. male with a hx of coronary artery disease, CABG, hypertension, hyperlipidemia, paroxysmal atrial fibrillation, hypothyroidism, CKD who presents with chest pain and was found to have a  GI bleed and mildly elevated troponins .  Who is being seen today for the evaluation of  Elevated troponins  at the request of  Dr. Tamala Julian.  Subjective   Currently back in afib - some RVR overnight, but rate-controlled today. Follows commands, ?wean off sedation - SBT today?  Objective   Vitals:   11/10/19 0500 11/10/19 0530 11/10/19 0600 11/10/19 0700  BP: 106/78 125/71 104/72 111/80  Pulse: 73 96 (!) 103 (!) 103  Resp: _0 Temp:    (!) 97.4 F (36.3 C)  TempSrc:    Axillary  SpO2: 99% 100% 99% 99%  Weight: 129.9 kg     Height:        Intake/Output Summary (Last 24 hours) at 11/10/2019 0817 Last data filed at 11/10/2019 0600 Gross per 24 hour  Intake 2002.15 ml  Output 1000 ml  Net 1002.15 ml   Filed Weights   11/07/19 0446 11/09/19 0406 11/10/19 0500  Weight: 124.8 kg 128.5 kg 129.9 kg    Physical Exam   General appearance: intubated, lightly sedated vent, mitts in place Neck: no carotid bruit, no JVD and thyroid not enlarged, symmetric, no tenderness/mass/nodules Lungs: diminished breath sounds bilaterally Heart: irregularly irregular rhythm Abdomen: soft, non-tender; bowel sounds normal; no masses,  no organomegaly Extremities: extremities normal, atraumatic, no cyanosis or edema Pulses: 2+ and symmetric Skin: cool, dry Neurologic: Mental status: lightly sedated, opens eyes to voice Psych: Cannot assess  Inpatient Medications    Scheduled Meds: . sodium chloride   Intravenous Once  . chlorhexidine gluconate (MEDLINE KIT)  15 mL Mouth Rinse BID  .  Chlorhexidine Gluconate Cloth  6 each Topical Daily  . clopidogrel  75 mg Per Tube Daily  . docusate  100 mg Per Tube BID  . feeding supplement (PRO-STAT SUGAR FREE 64)  30 mL Per Tube TID  . feeding supplement (VITAL HIGH PROTEIN)  1,000 mL Per Tube Q24H  . ferrous sulfate  300 mg Per Tube Q M,W,F  . finasteride  5 mg Oral QHS  . fluticasone  2 spray Each Nare Daily  . furosemide  60 mg Intravenous Q6H  . insulin aspart  0-15 Units Subcutaneous Q4H  . insulin detemir  25 Units Subcutaneous BID  . levothyroxine  150 mcg Per Tube QAC breakfast  . mouth rinse  15 mL Mouth Rinse 10 times per day  . melatonin  3 mg Per Tube QHS  . metoprolol tartrate  12.5 mg Per Tube BID  . midodrine  10 mg Per Tube TID WC  . nortriptyline  50 mg Per Tube QHS  . pantoprazole sodium  40 mg Per Tube BID  . polyethylene glycol  17 g Per Tube Daily  . senna  2 tablet Per Tube q AM   And  . senna  1 tablet Per Tube QHS  . sertraline  25 mg Per Tube BH-[id number]  . sodium bicarbonate  50 mEq Intravenous Once  . sodium chloride flush  3 mL Intravenous Q12H  . traZODone  100 mg Per Tube QHS    Continuous Infusions: .  fentaNYL infusion INTRAVENOUS 75 mcg/hr (11/10/19 0600)  . heparin 1,750 Units/hr (11/10/19 0600)    PRN Meds: acetaminophen, alum & mag hydroxide-simeth **AND** lidocaine, fentaNYL, HYDROmorphone (DILAUDID) injection, lidocaine, midazolam, nitroGLYCERIN, OLANZapine zydis, polyethylene glycol, simethicone, sodium chloride, triamcinolone cream   Labs   Results for orders placed or performed during the hospital encounter of 10/23/2019 (from the past 48 hour(s))  Glucose, capillary     Status: Abnormal   Collection Time: 11/08/19 11:35 AM  Result Value Ref Range   Glucose-Capillary 179 (H) 70 - 99 mg/dL    Comment: Glucose reference range applies only to samples taken after fasting for at least 8 hours.  Brain natriuretic peptide     Status: Abnormal   Collection Time: 11/08/19 12:50 PM  Result  Value Ref Range   B Natriuretic Peptide 1,138.4 (H) 0.0 - 100.0 pg/mL    Comment: Performed at [facility] [address] [place], [place] [address]  Troponin I (High Sensitivity)     Status: Abnormal   Collection Time: 11/08/19 12:50 PM  Result Value Ref Range   Troponin I (High Sensitivity) 11,558 (HH) <18 ng/L    Comment: CRITICAL VALUE NOTED.  VALUE IS CONSISTENT WITH PREVIOUSLY REPORTED AND CALLED VALUE. (NOTE) Elevated high sensitivity troponin I (hsTnI) values and significant  changes across serial measurements may suggest ACS but many other  chronic and acute conditions are known to elevate hsTnI results.  Refer to the Links section for chest pain algorithms and additional  guidance. Performed at [facility], [place] [address] [place], [place] [address]   Glucose, capillary     Status: Abnormal   Collection Time: 11/08/19  3:44 PM  Result Value Ref Range   Glucose-Capillary 203 (H) 70 - 99 mg/dL    Comment: Glucose reference range applies only to samples taken after fasting for at least 8 hours.  Magnesium     Status: None   Collection Time: 11/08/19  4:00 PM  Result Value Ref Range   Magnesium 2.2 1.7 - 2.4 mg/dL    Comment: Performed at [facility], [place] [address] [place], [place] [address]  Phosphorus     Status: Abnormal   Collection Time: 11/08/19  4:00 PM  Result Value Ref Range   Phosphorus 5.1 (H) 2.5 - 4.6 mg/dL    Comment: Performed at [facility] [address] [place], [place] [address]  Troponin I (High Sensitivity)     Status: Abnormal   Collection Time: 11/08/19  4:00 PM  Result Value Ref Range   Troponin I (High Sensitivity) 11,652 (HH) <18 ng/L    Comment: CRITICAL VALUE NOTED.  VALUE IS CONSISTENT WITH PREVIOUSLY REPORTED AND CALLED VALUE. (NOTE) Elevated high sensitivity troponin I (hsTnI) values and significant  changes across serial measurements may suggest ACS but many other  chronic and acute conditions are known to  elevate hsTnI results.  Refer to the Links section for chest pain algorithms and additional  guidance. Performed at [facility], [place] [address] [place], [place] [address]   Glucose, capillary     Status: Abnormal   Collection Time: 11/08/19  7:54 PM  Result Value Ref Range   Glucose-Capillary 210 (H) 70 - 99 mg/dL    Comment: Glucose reference range applies only to samples taken after fasting for at least 8 hours.   Comment 1 Notify RN   Glucose, capillary     Status: Abnormal   Collection Time: 11/09/19  12:20 AM  Result Value Ref Range   Glucose-Capillary 261 (H) 70 - 99 mg/dL    Comment: Glucose reference range applies only to samples taken after fasting for at least 8 hours.   Comment 1 Notify RN   CBC     Status: Abnormal   Collection Time: 11/09/19  2:19 AM  Result Value Ref Range   WBC 10.7 (H) 4.0 - 10.5 K/uL   RBC 2.96 (L) 4.22 - 5.81 MIL/uL   Hemoglobin 9.0 (L) 13.0 - 17.0 g/dL   HCT 30.0 (L) 39.0 - 52.0 %   MCV 101.4 (H) 80.0 - 100.0 fL   MCH 30.4 26.0 - 34.0 pg   MCHC 30.0 30.0 - 36.0 g/dL   RDW 18.3 (H) 11.5 - 15.5 %   Platelets 198 150 - 400 K/uL   nRBC 0.4 (H) 0.0 - 0.2 %    Comment: Performed at [facility], [place] [address] [place], [place] [address]  Heparin level (unfractionated)     Status: None   Collection Time: 11/09/19  2:19 AM  Result Value Ref Range   Heparin Unfractionated 0.35 0.30 - 0.70 IU/mL    Comment: (NOTE) If heparin results are below expected values, and patient dosage has  been confirmed, suggest follow up testing of antithrombin III levels. Performed at [facility], [place] [address], [place], [place] [address]   Brain natriuretic peptide     Status: Abnormal   Collection Time: 11/09/19  2:19 AM  Result Value Ref Range   B Natriuretic Peptide 1,214.4 (H) 0.0 - 100.0 pg/mL    Comment: Performed at [facility] [address], [place], [place] [address]  Magnesium     Status: None   Collection Time: 11/09/19   2:19 AM  Result Value Ref Range   Magnesium 2.2 1.7 - 2.4 mg/dL    Comment: Performed at [facility] [address], [place], [place] [address]  Phosphorus     Status: None   Collection Time: 11/09/19  2:19 AM  Result Value Ref Range   Phosphorus 4.5 2.5 - 4.6 mg/dL    Comment: Performed at [facility] [address], [place], [place] [address]  Comprehensive metabolic panel     Status: Abnormal   Collection Time: 11/09/19  2:19 AM  Result Value Ref Range   Sodium 136 135 - 145 mmol/L   Potassium 4.6 3.5 - 5.1 mmol/L   Chloride 99 98 - 111 mmol/L   CO2 21 (L) 22 - 32 mmol/L   Glucose, Bld 264 (H) 70 - 99 mg/dL    Comment: Glucose reference range applies only to samples taken after fasting for at least 8 hours.   BUN 65 (H) 8 - 23 mg/dL   Creatinine, Ser 3.07 (H) 0.61 - 1.24 mg/dL   Calcium 8.0 (L) 8.9 - 10.3 mg/dL   Total Protein 6.0 (L) 6.5 - 8.1 g/dL   Albumin 2.4 (L) 3.5 - 5.0 g/dL   AST 71 (H) 15 - 41 U/L   ALT 340 (H) 0 - 44 U/L   Alkaline Phosphatase 47 38 - 126 U/L   Total Bilirubin 0.8 0.3 - 1.2 mg/dL   GFR calc non Af Amer 19 (L) >60 mL/min   GFR calc Af Amer 22 (L) >60 mL/min   Anion gap 16 (H) 5 - 15    Comment: Performed at [facility], [place] [address], [place], [place] [address]  Glucose, capillary     Status: Abnormal   Collection Time: 11/09/19  4:00 AM  Result Value Ref Range   Glucose-Capillary 260 (H) 70 - 99 mg/dL    Comment: Glucose reference range applies only to samples taken after fasting for at least 8 hours.   Comment 1 Notify RN   Glucose, capillary     Status: Abnormal   Collection Time: 11/09/19  8:02 AM  Result Value Ref Range   Glucose-Capillary 279 (H) 70 - 99 mg/dL    Comment: Glucose reference range applies only to samples taken after fasting for at least 8 hours.  Troponin I (High Sensitivity)     Status: Abnormal   Collection Time: 11/09/19 10:58 AM  Result Value Ref Range   Troponin I (High Sensitivity) 13,823 (HH)  <18 ng/L    Comment: CRITICAL VALUE NOTED.  VALUE IS CONSISTENT WITH PREVIOUSLY REPORTED AND CALLED VALUE. (NOTE) Elevated high sensitivity troponin I (hsTnI) values and significant  changes across serial measurements may suggest ACS but many other  chronic and acute conditions are known to elevate hsTnI results.  Refer to the Links section for chest pain algorithms and additional  guidance. Performed at [facility], [place] [address] [place], [place] [address]   Glucose, capillary     Status: Abnormal   Collection Time: 11/09/19 11:47 AM  Result Value Ref Range   Glucose-Capillary 267 (H) 70 - 99 mg/dL    Comment: Glucose reference range applies only to samples taken after fasting for at least 8 hours.  Glucose, capillary     Status: Abnormal   Collection Time: 11/09/19  4:06 PM  Result Value Ref Range   Glucose-Capillary 161 (H) 70 - 99 mg/dL    Comment: Glucose reference range applies only to samples taken after fasting for at least 8 hours.  Phosphorus     Status: Abnormal   Collection Time: 11/09/19  4:18 PM  Result Value Ref Range   Phosphorus 5.3 (H) 2.5 - 4.6 mg/dL    Comment: Performed at [facility] [address] [place], [place] [address]  Magnesium     Status: Abnormal   Collection Time: 11/09/19  4:18 PM  Result Value Ref Range   Magnesium 2.5 (H) 1.7 - 2.4 mg/dL    Comment: Performed at [facility] [address] [place], [place] [address]  Glucose, capillary     Status: Abnormal   Collection Time: 11/09/19  7:26 PM  Result Value Ref Range   Glucose-Capillary 260 (H) 70 - 99 mg/dL    Comment: Glucose reference range applies only to samples taken after fasting for at least 8 hours.  Basic metabolic panel     Status: Abnormal   Collection Time: 11/09/19 10:25 PM  Result Value Ref Range   Sodium 134 (L) 135 - 145 mmol/L   Potassium 4.2 3.5 - 5.1 mmol/L   Chloride 102 98 - 111 mmol/L   CO2 22 22 - 32 mmol/L   Glucose, Bld 327 (H) 70 - 99  mg/dL    Comment: Glucose reference range applies only to samples taken after fasting for at least 8 hours.   BUN 83 (H) 8 - 23 mg/dL   Creatinine, Ser 3.77 (H) 0.61 - 1.24 mg/dL   Calcium 7.7 (L) 8.9 - 10.3 mg/dL   GFR calc non Af Amer 15 (L) >60 mL/min   GFR calc Af Amer 17 (L) >60 mL/min   Anion gap 10 5 -  15    Comment: Performed at [facility], [place] [address], [place], [place] [address]  Glucose, capillary     Status: Abnormal   Collection Time: 11/09/19 11:35 PM  Result Value Ref Range   Glucose-Capillary 307 (H) 70 - 99 mg/dL    Comment: Glucose reference range applies only to samples taken after fasting for at least 8 hours.  Heparin level (unfractionated)     Status: None   Collection Time: 11/10/19  3:25 AM  Result Value Ref Range   Heparin Unfractionated 0.32 0.30 - 0.70 IU/mL    Comment: (NOTE) If heparin results are below expected values, and patient dosage has  been confirmed, suggest follow up testing of antithrombin III levels. Performed at [facility], [place] [address], [place], [place] [address]   CBC     Status: Abnormal   Collection Time: 11/10/19  3:25 AM  Result Value Ref Range   WBC 13.1 (H) 4.0 - 10.5 K/uL   RBC 2.99 (L) 4.22 - 5.81 MIL/uL   Hemoglobin 9.0 (L) 13.0 - 17.0 g/dL   HCT 29.4 (L) 39.0 - 52.0 %   MCV 98.3 80.0 - 100.0 fL   MCH 30.1 26.0 - 34.0 pg   MCHC 30.6 30.0 - 36.0 g/dL   RDW 18.3 (H) 11.5 - 15.5 %   Platelets 225 150 - 400 K/uL   nRBC 0.3 (H) 0.0 - 0.2 %    Comment: Performed at [facility] [address], [place], [place] [address]  Magnesium     Status: None   Collection Time: 11/10/19  3:25 AM  Result Value Ref Range   Magnesium 2.2 1.7 - 2.4 mg/dL    Comment: Performed at [facility], [place] [address], [place], [place] [address]  Phosphorus     Status: Abnormal   Collection Time: 11/10/19  3:25 AM  Result Value Ref Range   Phosphorus 5.0 (H) 2.5 - 4.6 mg/dL    Comment: Performed at [facility] [address], [place], [place] [address]  Comprehensive metabolic panel     Status: Abnormal   Collection Time: 11/10/19  3:25 AM  Result Value Ref Range   Sodium 135 135 - 145 mmol/L   Potassium 4.5 3.5 - 5.1 mmol/L   Chloride 98 98 - 111 mmol/L   CO2 22 22 - 32 mmol/L   Glucose, Bld 327 (H) 70 - 99 mg/dL    Comment: Glucose reference range applies only to samples taken after fasting for at least 8 hours.   BUN 90 (H) 8 - 23 mg/dL   Creatinine, Ser 3.70 (H) 0.61 - 1.24 mg/dL   Calcium 8.0 (L) 8.9 - 10.3 mg/dL   Total Protein 6.2 (L) 6.5 - 8.1 g/dL   Albumin 2.3 (L) 3.5 - 5.0 g/dL   AST 38 15 - 41 U/L   ALT 213 (H) 0 - 44 U/L   Alkaline Phosphatase 47 38 - 126 U/L   Total Bilirubin 0.8 0.3 - 1.2 mg/dL   GFR calc non Af Amer 15 (L) >60 mL/min   GFR calc Af Amer 17 (L) >60 mL/min   Anion gap 15 5 - 15    Comment: Performed at [facility] [address] [place], [place] [address]  Brain natriuretic peptide     Status: Abnormal   Collection Time: 11/10/19  3:25 AM  Result Value Ref Range   B Natriuretic Peptide 1,590.0 (H)  0.0 - 100.0 pg/mL    Comment: Performed at [facility], [place] [address] [place], [place] [address]  Glucose, capillary     Status: Abnormal   Collection Time: 11/10/19  3:35 AM  Result Value Ref Range   Glucose-Capillary 320 (H) 70 - 99 mg/dL    Comment: Glucose reference range applies only to samples taken after fasting for at least 8 hours.  Troponin I (High Sensitivity)     Status: Abnormal   Collection Time: 11/10/19  5:51 AM  Result Value Ref Range   Troponin I (High Sensitivity) 10,784 (HH) <18 ng/L    Comment: CRITICAL VALUE NOTED.  VALUE IS CONSISTENT WITH PREVIOUSLY REPORTED AND CALLED VALUE. (NOTE) Elevated high sensitivity troponin I (hsTnI) values and significant  changes across serial measurements may suggest ACS but many other  chronic and acute conditions are known to elevate hsTnI results.  Refer to the Links section for chest  pain algorithms and additional  guidance. Performed at [facility], [place] [address], [place], [place] [address]   Glucose, capillary     Status: Abnormal   Collection Time: 11/10/19  7:32 AM  Result Value Ref Range   Glucose-Capillary 291 (H) 70 - 99 mg/dL    Comment: Glucose reference range applies only to samples taken after fasting for at least 8 hours.    ECG   N/A  Telemetry   Afib with CVR - personally reviewed  Radiology    DG Chest Port 1 View  Result Date: 11/10/2019 CLINICAL DATA:  Acute respiratory failure EXAM: PORTABLE CHEST 1 VIEW COMPARISON:  Nov 09, 2019 FINDINGS: There is mild cardiomegaly. Prominence of the central pulmonary vasculature is seen. Small bilateral pleural effusions are noted. ETT is 2 cm above level of the carina. NG tube is seen below the diaphragm. Overlying median sternotomy wires are present. IMPRESSION: Support lines and tubes in satisfactory position. Pulmonary vascular congestion and small bilateral pleural effusions. Electronically Signed   By: Prudencio Pair M.D.   On: 11/10/2019 05:36   DG CHEST PORT 1 VIEW  Result Date: 11/09/2019 CLINICAL DATA:  Acute respiratory failure EXAM: PORTABLE CHEST 1 VIEW COMPARISON:  Chest radiograph from one day prior. FINDINGS: Endotracheal tube tip is 3.9 cm above the carina. Enteric tube enters stomach with the tip not seen on this image. Stable configuration of median sternotomy wires with multiple discontinuities. Stable cardiomediastinal silhouette with moderate cardiomegaly. No pneumothorax. Small bilateral pleural effusions, mildly decreased bilaterally. Mild-to-moderate pulmonary edema, improved. Stable bibasilar atelectasis. IMPRESSION: 1. Well-positioned support structures. 2. Mild-to-moderate congestive heart failure, improved. 3. Small bilateral pleural effusions, mildly decreased bilaterally. 4. Stable bibasilar atelectasis. Electronically Signed   By: Ilona Sorrel M.D.   On: 11/09/2019 06:03   DG  CHEST PORT 1 VIEW  Result Date: 11/08/2019 CLINICAL DATA:  Pulmonary edema. EXAM: PORTABLE CHEST 1 VIEW COMPARISON:  Nov 07, 2019 FINDINGS: Stable cardiomegaly. Bilateral pulmonary opacities worrisome for pulmonary edema given history, worsened in the interval. The ETT is in good position. The NG tube terminates below today's film. No other interval changes. IMPRESSION: 1. Cardiomegaly and bilateral pulmonary opacities favored to represent pulmonary edema given history, worsened in the interval. Support apparatus as above. Electronically Signed   By: Dorise Bullion III M.D   On: 11/08/2019 13:37    Cardiac Studies   TEE Bedside (11/06/2018)  Verbal consent from wife Sedation Fentanyl and Precedex  Mild LVE EF 35-40% more diffuse hypokinesis slightly worse in inferior wall  Moderate MR Moderate TR Moderate RV hypokinesis with mild dilatation Normal aorta with no dissection and minimal plaque No ASD/PFO No effusion  Large amount of LAA thrombus   Continue supportive care Lasix for CHF Amiodarone if needed for recurrent ventricular arrhythmia Start heparin for PAF and LAA thrombus  Vent management per CCM CXR does not look that bad in regard To CHF. ETT advance 4 cm before TEE  Wife has been updated at length  Jenkins Rouge MD [facility]  Assessment   Principal Problem:   Chest pain Active Problems:   Hypothyroidism   PAF (paroxysmal atrial fibrillation) (HCC)   CKD (chronic kidney disease), stage III   AAA (abdominal aortic aneurysm) (HCC)   Diabetic polyneuropathy associated with type 2 diabetes mellitus (HCC)   OSA (obstructive sleep apnea)   Prolonged QT interval   Depression   GI bleed   Acute blood loss anemia   Hyperlipidemia LDL goal <70   AKI (acute kidney injury) ([facility])   Blood in stool   Stage 4 chronic kidney disease (HCC)   Acute renal failure superimposed on stage 4 chronic kidney disease (HCC)   NSTEMI (non-ST elevated myocardial infarction) ([facility])   Acute  lower UTI   Respiratory arrest ([facility])   Acute respiratory failure ([place])   Plan   1. Back in afib, but rate-controlled today. Continue with metoprolol per tube - can increase standing dose if HR trends up.   Time Spent Directly with Patient:  I have spent a total of 25 minutes with the patient reviewing hospital notes, telemetry, EKGs, labs and examining the patient as well as establishing an assessment and plan that was discussed personally with the patient.  > 50% of time was spent in direct patient care.  Length of Stay:  LOS: 16 days   Pixie Casino, MD, [facility], [place] Director of the Advanced Lipid Disorders &  Cardiovascular Risk Reduction Clinic Diplomate of the American Board of Clinical Lipidology Attending Cardiologist  Direct Dial: [phone]  Fax: [phone]  Website:  [email or web address] Lovell Roe 11/10/2019, 8:17 AM

## 2019-11-10 NOTE — Progress Notes (Signed)
Inpatient Diabetes Program Recommendations  AACE/ADA: New Consensus Statement on Inpatient Glycemic Control (2015)  Target Ranges:  Prepandial:   less than 140 mg/dL      Peak postprandial:   less than 180 mg/dL (1-2 hours)      Critically ill patients:  140 - 180 mg/dL   Lab Results  Component Value Date   GLUCAP 291 (H) 11/10/2019   HGBA1C 6.6 (H) 10/18/2019    Review of Glycemic Control Results for Matthew, [name] (MRN [id number]) as of 11/10/2019 09:28  Ref. Range 11/09/2019 04:00 11/09/2019 08:02 11/09/2019 11:47 11/09/2019 16:06  Glucose-Capillary Latest Ref Range: 70 - 99 mg/dL 260 (H) 279 (H) 267 (H) 161 (H)   Results for Matthew, [name] (MRN [id number]) as of 11/10/2019 09:28  Ref. Range 11/09/2019 16:06 11/09/2019 19:26 11/09/2019 23:35 11/10/2019 03:35 11/10/2019 07:32  Glucose-Capillary Latest Ref Range: 70 - 99 mg/dL 161 (H) 260 (H) 307 (H) 320 (H) 291 (H)    Diabetes history:  DM2  Home meds: Levemir 50 units bis   Current orders for Inpatient glycemic control: Levemir 25 units bid Novolog 0-15 qhr Vital HP 58m/hr continuous  Inpatient Diabetes Program Recommendations:     cbg's elevated since tube feed started on 5/30.  Please consider,  Novolog 3 units q4h tube feed coverage.  Stop is tube feeds are held or discontinued and cbg <80 mg/dl.     Thank you, JReche Dixon RN, BSN Diabetes Coordinator Inpatient Diabetes Program [phone](team pager from 8a-5p)

## 2019-11-10 NOTE — Progress Notes (Signed)
[facility] PROGRESS NOTE  Assessment/ Plan: Pt is a 74 y.o. yo male with history of hypertension, CAD status post CABG, HLD, A. fib, CKD presented with chest pain found to have GI bleed, elevated troponin and AKI on CKD.  # AKI on CKD 4:  Baseline creatinine 2-2.2, solitary kidney.  Had bleeding with some degree of shock likely causing ATN however work-up has been reassuring and no other obvious signs of insult.  Renal function was improving but worsened in the setting of cardiac arrest on 5/28.   Fortunately, the creatinine level is stable and urine output is decent with IV diuretics.  Noted BUN level is rising.  Still has some pulmonary congestion therefore agree with continuing  IV Lasix today.  Continue to monitor renal function.  No indication for dialysis.  #V. fib arrest, and NSTEMI, LAA thrombus: Currently on IV heparin with plan to transition to NOAC once he is extubated and is started taking orally.  Cardiology is following.  Not planning for cardiac cath.  Started metoprolol.  #Shock, improved:   Off of pressors, on midodrine.  Blood pressure acceptable.  #Anemia: acute blood loss s/p resection of gastric polyps. Transfusion per primary.   # Hypokalemia:  Improved.  #Hyponatremia, resolved:   Discussed with ICU team.  Subjective: Seen and examined in ICU.  Remains on ventilation.  Urine output of 1100 cc with IV diuretics.  No new event.  Off pressors.  Noted metoprolol was started. Objective Vital signs in last 24 hours: Vitals:   11/10/19 0600 11/10/19 0700 11/10/19 0735 11/10/19 0832  BP: 104/72 111/80 (!) 110/95   Pulse: (!) 103 (!) 103 (!) 111   Resp: _0 Temp:  (!) 97.4 F (36.3 C)    TempSrc:  Axillary    SpO2: 99% 99% 100% 97%  Weight:      Height:       Weight change: 1.4 kg  Intake/Output Summary (Last 24 hours) at 11/10/2019 0920 Last data filed at 11/10/2019 0800 Gross per 24 hour  Intake 2054.59 ml  Output 1300 ml   Net 754.59 ml       Labs: Basic Metabolic Panel: Recent Labs  Lab 11/09/19 0219 11/09/19 1618 11/09/19 2225 11/10/19 0325  NA 136  --  134* 135  K 4.6  --  4.2 4.5  CL 99  --  102 98  CO2 21*  --  22 22  GLUCOSE 264*  --  327* 327*  BUN 65*  --  83* 90*  CREATININE 3.07*  --  3.77* 3.70*  CALCIUM 8.0*  --  7.7* 8.0*  PHOS 4.5 5.3*  --  5.0*   Liver Function Tests: Recent Labs  Lab 11/07/19 0215 11/09/19 0219 11/10/19 0325  AST 317* 71* 38  ALT 843* 340* 213*  ALKPHOS 40 47 47  BILITOT 0.9 0.8 0.8  PROT 6.1* 6.0* 6.2*  ALBUMIN 2.7* 2.4* 2.3*   Recent Labs  Lab 10/20/2019 1015  LIPASE 14   Recent Labs  Lab 11/03/19 1642  AMMONIA 20   CBC: Recent Labs  Lab 11/04/19 0343 11/04/19 0343 10/18/2019 0725 11/08/2019 0725 11/06/19 0320 11/06/19 0320 11/06/19 0824 11/06/19 1806 11/07/19 0215 11/07/19 0215 11/08/19 0154 11/09/19 0219 11/10/19 0325  WBC 14.5*   < > 10.1   < > 16.8*   < > 16.4*  --  9.7   < > 11.3* 10.7* 13.1*  NEUTROABS 12.0*  --  7.4  --  12.9*  --   --   --   --   --   --   --   --  HGB 9.5*   < > 8.6*   < > 9.5*   < > 9.1*   < > 9.3*   < > 9.3* 9.0* 9.0*  HCT 32.1*   < > 28.4*   < > 30.0*   < > 29.6*   < > 29.0*   < > 30.1* 30.0* 29.4*  MCV 98.5   < > 96.3   < > 97.4   < > 96.4  --  94.8  --  97.1 101.4* 98.3  PLT 361   < > 263   < > 330   < > 307  --  235   < > 227 198 225   < > = values in this interval not displayed.   Cardiac Enzymes: Recent Labs  Lab 11/06/19 0827  CKTOTAL 171  CKMB 9.7*   CBG: Recent Labs  Lab 11/09/19 1606 11/09/19 1926 11/09/19 2335 11/10/19 0335 11/10/19 0732  GLUCAP 161* 260* 307* 320* 291*    Iron Studies: No results for input(s): IRON, TIBC, TRANSFERRIN, FERRITIN in the last 72 hours. Studies/Results: DG Chest Port 1 View  Result Date: 11/10/2019 CLINICAL DATA:  Acute respiratory failure EXAM: PORTABLE CHEST 1 VIEW COMPARISON:  Nov 09, 2019 FINDINGS: There is mild cardiomegaly. Prominence of  the central pulmonary vasculature is seen. Small bilateral pleural effusions are noted. ETT is 2 cm above level of the carina. NG tube is seen below the diaphragm. Overlying median sternotomy wires are present. IMPRESSION: Support lines and tubes in satisfactory position. Pulmonary vascular congestion and small bilateral pleural effusions. Electronically Signed   By: Prudencio Pair M.D.   On: 11/10/2019 05:36   DG CHEST PORT 1 VIEW  Result Date: 11/09/2019 CLINICAL DATA:  Acute respiratory failure EXAM: PORTABLE CHEST 1 VIEW COMPARISON:  Chest radiograph from one day prior. FINDINGS: Endotracheal tube tip is 3.9 cm above the carina. Enteric tube enters stomach with the tip not seen on this image. Stable configuration of median sternotomy wires with multiple discontinuities. Stable cardiomediastinal silhouette with moderate cardiomegaly. No pneumothorax. Small bilateral pleural effusions, mildly decreased bilaterally. Mild-to-moderate pulmonary edema, improved. Stable bibasilar atelectasis. IMPRESSION: 1. Well-positioned support structures. 2. Mild-to-moderate congestive heart failure, improved. 3. Small bilateral pleural effusions, mildly decreased bilaterally. 4. Stable bibasilar atelectasis. Electronically Signed   By: Ilona Sorrel M.D.   On: 11/09/2019 06:03   DG CHEST PORT 1 VIEW  Result Date: 11/08/2019 CLINICAL DATA:  Pulmonary edema. EXAM: PORTABLE CHEST 1 VIEW COMPARISON:  Nov 07, 2019 FINDINGS: Stable cardiomegaly. Bilateral pulmonary opacities worrisome for pulmonary edema given history, worsened in the interval. The ETT is in good position. The NG tube terminates below today's film. No other interval changes. IMPRESSION: 1. Cardiomegaly and bilateral pulmonary opacities favored to represent pulmonary edema given history, worsened in the interval. Support apparatus as above. Electronically Signed   By: Dorise Bullion III M.D   On: 11/08/2019 13:37    Medications: Infusions: . fentaNYL infusion  INTRAVENOUS 50 mcg/hr (11/10/19 0800)  . heparin 1,750 Units/hr (11/10/19 0700)    Scheduled Medications: . sodium chloride   Intravenous Once  . chlorhexidine gluconate (MEDLINE KIT)  15 mL Mouth Rinse BID  . Chlorhexidine Gluconate Cloth  6 each Topical Daily  . clopidogrel  75 mg Per Tube Daily  . docusate  100 mg Per Tube BID  . feeding supplement (PRO-STAT SUGAR FREE 64)  30 mL Per Tube TID  . feeding supplement (VITAL HIGH PROTEIN)  1,000 mL Per Tube Q24H  .  ferrous sulfate  300 mg Per Tube Q M,W,F  . finasteride  5 mg Oral QHS  . fluticasone  2 spray Each Nare Daily  . furosemide  60 mg Intravenous Q6H  . insulin aspart  0-15 Units Subcutaneous Q4H  . insulin detemir  25 Units Subcutaneous BID  . levothyroxine  150 mcg Per Tube QAC breakfast  . mouth rinse  15 mL Mouth Rinse 10 times per day  . melatonin  3 mg Per Tube QHS  . metoprolol tartrate  12.5 mg Per Tube BID  . midodrine  10 mg Per Tube TID WC  . nortriptyline  50 mg Per Tube QHS  . pantoprazole sodium  40 mg Per Tube BID  . polyethylene glycol  17 g Per Tube Daily  . senna  2 tablet Per Tube q AM   And  . senna  1 tablet Per Tube QHS  . sertraline  25 mg Per Tube BH-q7a  . sodium bicarbonate  50 mEq Intravenous Once  . sodium chloride flush  3 mL Intravenous Q12H  . traZODone  100 mg Per Tube QHS    have reviewed scheduled and prn medications.  Physical Exam: General: Ill-looking male, sedated and intubated Heart:RRR, s1s2 nl, no rubs Lungs: Coarse breath sound bilateral, no wheeze Abdomen:soft, Non-tender, non-distended Extremities: Trace LE edema Neurology: Intubated  Rosita Fire 11/10/2019,9:20 AM  LOS: 16 days  Pager: [phone]

## 2019-11-10 DEATH — deceased

## 2019-11-11 ENCOUNTER — Inpatient Hospital Stay (HOSPITAL_COMMUNITY): Payer: 59

## 2019-11-11 LAB — CBC
HCT: 30.3 % — ABNORMAL LOW (ref 39.0–52.0)
Hemoglobin: 9.1 g/dL — ABNORMAL LOW (ref 13.0–17.0)
MCH: 29.4 pg (ref 26.0–34.0)
MCHC: 30 g/dL (ref 30.0–36.0)
MCV: 97.7 fL (ref 80.0–100.0)
Platelets: 260 10*3/uL (ref 150–400)
RBC: 3.1 MIL/uL — ABNORMAL LOW (ref 4.22–5.81)
RDW: 18 % — ABNORMAL HIGH (ref 11.5–15.5)
WBC: 12.6 10*3/uL — ABNORMAL HIGH (ref 4.0–10.5)
nRBC: 0.4 % — ABNORMAL HIGH (ref 0.0–0.2)

## 2019-11-11 LAB — BASIC METABOLIC PANEL
Anion gap: 13 (ref 5–15)
BUN: 104 mg/dL — ABNORMAL HIGH (ref 8–23)
CO2: 25 mmol/L (ref 22–32)
Calcium: 8.4 mg/dL — ABNORMAL LOW (ref 8.9–10.3)
Chloride: 98 mmol/L (ref 98–111)
Creatinine, Ser: 3.5 mg/dL — ABNORMAL HIGH (ref 0.61–1.24)
GFR calc Af Amer: 19 mL/min — ABNORMAL LOW (ref >60)
GFR calc non Af Amer: 16 mL/min — ABNORMAL LOW (ref >60)
Glucose, Bld: 355 mg/dL — ABNORMAL HIGH (ref 70–99)
Potassium: 4.6 mmol/L (ref 3.5–5.1)
Sodium: 136 mmol/L (ref 135–145)

## 2019-11-11 LAB — GLUCOSE, CAPILLARY
Glucose-Capillary: 352 mg/dL — ABNORMAL HIGH (ref 70–99)
Glucose-Capillary: 338 mg/dL — ABNORMAL HIGH (ref 70–99)
Glucose-Capillary: 363 mg/dL — ABNORMAL HIGH (ref 70–99)

## 2019-11-11 LAB — MAGNESIUM: Magnesium: 2.3 mg/dL (ref 1.7–2.4)

## 2019-11-11 LAB — CULTURE, BLOOD (ROUTINE X 2)
Culture: NO GROWTH
Culture: NO GROWTH
Special Requests: ADEQUATE
Special Requests: ADEQUATE

## 2019-11-11 LAB — HEPARIN LEVEL (UNFRACTIONATED): Heparin Unfractionated: 0.3 IU/mL (ref 0.30–0.70)

## 2019-11-11 LAB — PHOSPHORUS: Phosphorus: 4.8 mg/dL — ABNORMAL HIGH (ref 2.5–4.6)

## 2019-11-11 MED ORDER — INSULIN DETEMIR 100 UNIT/ML ~~LOC~~ SOLN
40.0000 [IU] | Freq: Two times a day (BID) | SUBCUTANEOUS | Status: DC
  Filled 2019-11-11: qty 0.4

## 2019-11-11 MED ORDER — ACETAMINOPHEN 325 MG PO TABS: 650.0000 mg | ORAL_TABLET | Freq: Four times a day (QID) | ORAL | Status: DC | PRN

## 2019-11-11 MED ORDER — POLYVINYL ALCOHOL 1.4 % OP SOLN: 1.0000 [drp] | Freq: Four times a day (QID) | OPHTHALMIC | Status: DC | PRN

## 2019-11-11 MED ORDER — GLYCOPYRROLATE 0.2 MG/ML IJ SOLN: 0.2000 mg | INTRAMUSCULAR | Status: DC | PRN

## 2019-11-11 MED ORDER — MORPHINE 100MG IN NS 100ML (1MG/ML) PREMIX INFUSION
0.0000 mg/h | INTRAVENOUS | Status: DC
  Administered 2019-11-11: 3 mg/h via INTRAVENOUS
  Administered 2019-11-11: 13 mg/h via INTRAVENOUS
  Filled 2019-11-11: qty 100

## 2019-11-11 MED ORDER — AMIODARONE HCL IN DEXTROSE 360-4.14 MG/200ML-% IV SOLN: 30.0000 mg/h | INTRAVENOUS | Status: DC

## 2019-11-11 MED ORDER — MORPHINE SULFATE (PF) 2 MG/ML IV SOLN
2.0000 mg | INTRAVENOUS | Status: DC | PRN
  Administered 2019-11-11: 2 mg via INTRAVENOUS

## 2019-11-11 MED ORDER — POLYVINYL ALCOHOL 1.4 % OP SOLN
1.0000 [drp] | Freq: Four times a day (QID) | OPHTHALMIC | Status: DC | PRN
  Filled 2019-11-11: qty 15

## 2019-11-11 MED ORDER — MORPHINE 100MG IN NS 100ML (1MG/ML) PREMIX INFUSION: 0.0000 mg/h | INTRAVENOUS | Status: DC

## 2019-11-11 MED ORDER — SODIUM CHLORIDE 0.9 % IV SOLN: INTRAVENOUS | Status: DC | PRN

## 2019-11-11 MED ORDER — ACETAMINOPHEN 650 MG RE SUPP: 650.0000 mg | Freq: Four times a day (QID) | RECTAL | Status: DC | PRN

## 2019-11-11 MED ORDER — AMIODARONE LOAD VIA INFUSION
150.0000 mg | Freq: Once | INTRAVENOUS | Status: AC
  Administered 2019-11-11: 150 mg via INTRAVENOUS
  Filled 2019-11-11: qty 83.34

## 2019-11-11 MED ORDER — GLYCOPYRROLATE 0.2 MG/ML IJ SOLN
0.2000 mg | INTRAMUSCULAR | Status: DC | PRN
  Administered 2019-11-11: 0.2 mg via INTRAVENOUS
  Filled 2019-11-11: qty 1

## 2019-11-11 MED ORDER — GLYCOPYRROLATE 1 MG PO TABS: 1.0000 mg | ORAL_TABLET | ORAL | Status: DC | PRN

## 2019-11-11 MED ORDER — DEXTROSE 5 % IV SOLN: INTRAVENOUS | Status: DC

## 2019-11-11 MED ORDER — METOPROLOL TARTRATE 5 MG/5ML IV SOLN
2.5000 mg | Freq: Once | INTRAVENOUS | Status: AC
  Administered 2019-11-11: 2.5 mg via INTRAVENOUS
  Filled 2019-11-11: qty 5

## 2019-11-11 MED ORDER — MORPHINE BOLUS VIA INFUSION
5.0000 mg | INTRAVENOUS | Status: DC | PRN
  Administered 2019-11-11: 5 mg via INTRAVENOUS
  Filled 2019-11-11: qty 5

## 2019-11-11 MED ORDER — INSULIN ASPART 100 UNIT/ML ~~LOC~~ SOLN
5.0000 [IU] | SUBCUTANEOUS | Status: DC
  Administered 2019-11-11: 5 [IU] via SUBCUTANEOUS

## 2019-11-11 MED ORDER — AMIODARONE HCL IN DEXTROSE 360-4.14 MG/200ML-% IV SOLN
60.0000 mg/h | INTRAVENOUS | Status: DC
  Administered 2019-11-11 (×2): 60 mg/h via INTRAVENOUS
  Filled 2019-11-11 (×2): qty 200

## 2019-11-12 NOTE — Discharge Summary (Signed)
Physician Death Summary  Patient ID: Matthew [name] MRN: [id number] DOB/AGE: November 12, 1945 74 y.o.  Admit date: 10/17/2019 Discharge date: Dec 10, 2019  Admission Diagnoses: Chest pain, MI  Discharge Diagnoses:  Principal Problem:   Chest pain Active Problems:   Hypothyroidism   PAF (paroxysmal atrial fibrillation) (HCC)   CKD (chronic kidney disease), stage III   AAA (abdominal aortic aneurysm) (HCC)   Diabetic polyneuropathy associated with type 2 diabetes mellitus (HCC)   OSA (obstructive sleep apnea)   Prolonged QT interval   Depression   GI bleed   Acute blood loss anemia   Hyperlipidemia LDL goal <70   AKI (acute kidney injury) ([place])   Blood in stool   Stage 4 chronic kidney disease (HCC)   Acute renal failure superimposed on stage 4 chronic kidney disease (HCC)   NSTEMI (non-ST elevated myocardial infarction) ([place])   Acute lower UTI   Respiratory arrest ([place])   Acute respiratory failure ([place]) Cardiac arrest  Discharged Condition: Deceased  Hospital Course:  73yo m with PMHx of CAD s/p CABG, CKD IV, afib, HTN, HLD, hypothyroidism who presented with chest pain and was admitted with NSTEMI and GIB. Sustained VFib cardiac arrest on 5/28, with ROSC after approx 5 minutes. Intubated and transferred to ICU.   5/20 - Admitted with chest pain, GIB. Hospital course complicated by worsening GIB, s/p endoscopy 5/24, with clips placed to oozing gastric polyps. Cardiology has deferred cardiac cath due to AKI on CKD, GIB for which eliquis has been held. Ongoing hypotensive episodes and ongoing complaints of chest pain.   5/25 - neurology was consulted for aphasia with AMS. CT H without signs of acute CVA. Thought to be toxic metabolic encephalopathy.   5/27 - nephrology consulted for progressive AKI on CKD with solitary kidney.   5/28 - pt sustained a VFib arrest. Transferred to ICU   5/31 - Converted to NSR  6/1 - In afib with RVR overnight. Stable, following  commands  6/2 - Continued to have afib with RVR that was minimally responsive to metoprolol. Amiodarone ggt started.   He continued to be in AKI with poor response to Lasix, with pulmonary edema.  After multiple discussions with wife she has made it clear that he would not want to be supported like this.  Matthew [name] had a great life owning cars, boats and if he cannot had the same quality of life then he would not want continued medical care. She has requested transition to comfort care.  Orders placed for terminal withdrawal. He was compassionately extubated and passed away with family at bedside.  Matthew Garfinkel MD [place] [facility] Please see [email or web address] for pager details.  11/12/2019, 8:04 AM

## 2019-12-10 NOTE — Progress Notes (Signed)
Chaplain responded to page to attend patient's wife leading up to EOL comfort care.  Chaplain attended wife in ministry of presence, listening to the wonderful love stories that evolved in this 96 year marriage.  Chaplain attended on-set of comfort care, departed shortly thereafter with promise to return throughout the afternoon.  De Burrs Chaplain Resident

## 2019-12-10 NOTE — Progress Notes (Addendum)
[facility] [name] PROGRESS NOTE  Assessment/ Plan: Pt is a 74 y.o. yo male with history of hypertension, CAD status post CABG, HLD, A. fib, CKD presented with chest pain found to have GI bleed, elevated troponin and AKI on CKD.  # AKI on CKD 4:  Baseline creatinine 2-2.2, solitary kidney.  Had bleeding with some degree of shock likely causing ATN however work-up has been reassuring and no other obvious signs of insult.  Renal function was improving but worsened in the setting of cardiac arrest on 5/28.   Fortunately, the creatinine level is stable and urine output is decent with IV diuretics.  Noted BUN level is rising.  Still has some pulmonary congestion in x-ray therefore continue IV Lasix.  Continue to monitor renal function.  No indication for dialysis.  #V. fib arrest, and NSTEMI, LAA thrombus: Currently on IV heparin with plan to transition to NOAC once he is extubated and is started taking orally.  Cardiology is following.  Not planning for cardiac cath.  Started metoprolol.  #Shock, improved:   Off of pressors, on midodrine.  Blood pressure acceptable.  #Anemia: acute blood loss s/p resection of gastric polyps. Transfusion per primary.   #A. fib with RVR: On metoprolol.  # Hypokalemia:  Improved.  #Hyponatremia, resolved:   #Disposition: Long-term prognosis looks guarded.  Noted goals of care discussion ongoing with the family. Discussed with ICU team.  Subjective: Seen and examined in ICU.  Remains on ventilator.  Urine output around 1758 cc in 24 hours.  Off pressors.  No new event. Objective Vital signs in last 24 hours: Vitals:   2019/11/23 0727 11-23-19 0730 11/23/19 0800 November 23, 2019 0808  BP:  116/70 109/72   Pulse:  86 98   Resp:  _0 Temp: 99.9 F (37.7 C)     TempSrc: Oral     SpO2:  98% 98% 98%  Weight:      Height:       Weight change: 1.5 kg  Intake/Output Summary (Last 24 hours) at November 23, 2019 0837 Last data filed at 23-Nov-2019  0814 Gross per 24 hour  Intake 2074.11 ml  Output 1658 ml  Net 416.11 ml       Labs: Basic Metabolic Panel: Recent Labs  Lab 11/09/19 0219 11/09/19 1618 11/09/19 2225 11/10/19 0325 November 23, 2019 0227  NA   < >  --  134* 135 136  K   < >  --  4.2 4.5 4.6  CL   < >  --  102 98 98  CO2   < >  --  _1 GLUCOSE   < >  --  327* 327* 355*  BUN   < >  --  83* 90* 104*  CREATININE   < >  --  3.77* 3.70* 3.50*  CALCIUM   < >  --  7.7* 8.0* 8.4*  PHOS  --  5.3*  --  5.0* 4.8*   < > = values in this interval not displayed.   Liver Function Tests: Recent Labs  Lab 11/07/19 0215 11/09/19 0219 11/10/19 0325  AST 317* 71* 38  ALT 843* 340* 213*  ALKPHOS 40 47 47  BILITOT 0.9 0.8 0.8  PROT 6.1* 6.0* 6.2*  ALBUMIN 2.7* 2.4* 2.3*   Recent Labs  Lab 10/11/2019 1015  LIPASE 14   No results for input(s): AMMONIA in the last 168 hours. CBC: Recent Labs  Lab 10/16/2019 0725 10/31/2019 0725 11/06/19 0320 11/06/19 5449 11/07/19  0215 11/07/19 0215 11/08/19 0154 11/08/19 0154 11/09/19 0219 11/10/19 0325 11-26-19 0227  WBC 10.1   < > 16.8*   < > 9.7   < > 11.3*   < > 10.7* 13.1* 12.6*  NEUTROABS 7.4  --  12.9*  --   --   --   --   --   --   --   --   HGB 8.6*   < > 9.5*   < > 9.3*   < > 9.3*   < > 9.0* 9.0* 9.1*  HCT 28.4*   < > 30.0*   < > 29.0*   < > 30.1*   < > 30.0* 29.4* 30.3*  MCV 96.3   < > 97.4   < > 94.8  --  97.1  --  101.4* 98.3 97.7  PLT 263   < > 330   < > 235   < > 227   < > 198 225 260   < > = values in this interval not displayed.   Cardiac Enzymes: Recent Labs  Lab 11/06/19 0827  CKTOTAL 171  CKMB 9.7*   CBG: Recent Labs  Lab 11/10/19 1523 11/10/19 1937 11/10/19 2347 11/26/19 0349 11-26-2019 0725  GLUCAP 268* 298* 332* 352* 338*    Iron Studies: No results for input(s): IRON, TIBC, TRANSFERRIN, FERRITIN in the last 72 hours. Studies/Results: DG Chest Port 1 View  Result Date: 11/10/2019 CLINICAL DATA:  Acute respiratory failure EXAM: PORTABLE  CHEST 1 VIEW COMPARISON:  Nov 09, 2019 FINDINGS: There is mild cardiomegaly. Prominence of the central pulmonary vasculature is seen. Small bilateral pleural effusions are noted. ETT is 2 cm above level of the carina. NG tube is seen below the diaphragm. Overlying median sternotomy wires are present. IMPRESSION: Support lines and tubes in satisfactory position. Pulmonary vascular congestion and small bilateral pleural effusions. Electronically Signed   By: Prudencio Pair M.D.   On: 11/10/2019 05:36    Medications: Infusions:  sodium chloride 10 mL/hr at 2019/11/26 0820   fentaNYL infusion INTRAVENOUS 75 mcg/hr (2019-11-26 0800)   heparin 1,750 Units/hr (11-26-2019 0800)    Scheduled Medications:  sodium chloride   Intravenous Once   chlorhexidine gluconate (MEDLINE KIT)  15 mL Mouth Rinse BID   Chlorhexidine Gluconate Cloth  6 each Topical Daily   clopidogrel  75 mg Per Tube Daily   docusate  100 mg Per Tube BID   feeding supplement (PRO-STAT SUGAR FREE 64)  30 mL Per Tube TID   feeding supplement (VITAL HIGH PROTEIN)  1,000 mL Per Tube Q24H   ferrous sulfate  300 mg Per Tube Q M,W,F   fluticasone  2 spray Each Nare Daily   furosemide  60 mg Intravenous Q6H   insulin aspart  0-15 Units Subcutaneous Q4H   insulin aspart  3 Units Subcutaneous Q4H   insulin detemir  25 Units Subcutaneous BID   levothyroxine  150 mcg Per Tube QAC breakfast   mouth rinse  15 mL Mouth Rinse 10 times per day   melatonin  3 mg Per Tube QHS   metoprolol tartrate  25 mg Per Tube BID   midodrine  10 mg Per Tube TID WC   nortriptyline  50 mg Per Tube QHS   pantoprazole sodium  40 mg Per Tube BID   polyethylene glycol  17 g Per Tube Daily   senna  2 tablet Per Tube q AM   And   senna  1 tablet Per Tube QHS  sertraline  25 mg Per Tube BH-q7a   sodium bicarbonate  50 mEq Intravenous Once   sodium chloride flush  3 mL Intravenous Q12H   traZODone  100 mg Per Tube QHS    have reviewed  scheduled and prn medications.  Physical Exam: General: Ill-looking male, sedated and intubated Heart:RRR, s1s2 nl, no rubs Lungs: Coarse breath sound bilateral, no wheeze Abdomen:soft, Non-tender, non-distended Extremities: Trace LE edema Neurology: Intubated  Rosita Fire November 13, 2019,8:37 AM  LOS: 18 days  Pager: [phone]

## 2019-12-10 NOTE — Progress Notes (Signed)
Cardiology paged about patient being in afibb RVR with a HR up to the 150s. Awaiting response, will continue to monitor.

## 2019-12-10 NOTE — Progress Notes (Signed)
81m of morphine and 1023mof Fentanyl wasted with Zeinab, RN.

## 2019-12-10 NOTE — Progress Notes (Addendum)
NAME:  Matthew [name], MRN:  [id number], DOB:  07-08-1945, LOS: 58 ADMISSION DATE:  10/30/2019, CONSULTATION DATE:  11/06/19 REFERRING MD:  Mansy - TRH, CHIEF COMPLAINT:  VFib arrest  Brief History   74yo m with PMHx of CAD s/p CABG, CKD IV, afib, HTN, HLD, hypothyroidism who presented with chest pain and was admitted with NSTEMI and GIB. Sustained VFib cardiac arrest on 5/28, with ROSC after approx 5 minutes. Intubated and transferred to ICU.  Past Medical History  - AAA - Afib - CAD - CKD - Cocaine use in remission - DM  - Depression - GERD - HTN, HLD - Lung cancer  Significant Hospital Events   5/20 - Admitted with chest pain, GIB. Hospital course complicated by worsening GIB, s/p endoscopy 5/24, with clips placed to oozing gastric polyps. Cardiology has deferred cardiac cath due to AKI on CKD, GIB for which eliquis has been held. Ongoing hypotensive episodes and ongoing complaints of chest pain.   5/25 - neurology was consulted for aphasia with AMS. CT H without signs of acute CVA. Thought to be toxic metabolic encephalopathy.   5/27 - nephrology consulted for progressive AKI on CKD with solitary kidney.   5/28 - pt sustained a VFib arrest. Transferred to ICU   5/31 - Converted to NSR  6/1 - In afib with RVR overnight. Stable, following commands  6/2 - Continued to have afib with RVR that was minimally responsive to metoprolol. Amiodarone ggt started. Plan to extubate today.   Consults:  Cardiology Neurology Nephrology PCCM   Procedures:  5/24 Endoscopy 5/28 ETT>>   Significant Diagnostic Tests:  CT a/p 5/26> small bilateral pleural effusions. Lower lobe atelectasis, possible PNA. R infra hepatic fat stranding. No extraluminal gas or bowel wall thickening. Some stranding around midline pancreas  EGD/ Colonoscopy: 5/24 Oozing polyps, both were resected and clipped>> Needs follow up colonoscopy for small pre-cancerous polyps  Echo 5/25> LVEF 50-55%  TEE 5/28  > LVEF 35-40%, large amount of LAA thrombus, moderate MR, TR, moderate RV hypokinesis.  MRI Brain 5/29 > Generalized atrophy and chronic ischemic microangiopathy; no sign of acute stroke  CXR 6/1 > Bilateral pleural effusions that are unchanged/slightly improved from prior  Micro Data:  Blood culture 5/26-staph species x 1  Urine culture 5/26-negative Blood cultures 5/28-negative MRSA PCR 5/29-negative  Antimicrobials:  Cefepime, end 5/28 5/28 Unasyn >>  Interim history/subjective:   Continues to be on fentanyl and heparin gtt. Pt is tolerating 10/5 well on 30 of FiO2. Asleep and weaning sedation during initial exam but able to follow commands. Wife is at bedside.  Objective   Blood pressure 122/71, pulse 61, temperature 99.9 F (37.7 C), temperature source Oral, resp. rate (!) 24, height 6' (1.829 m), weight 131.4 kg, SpO2 97 %.    Vent Mode: PRVC FiO2 (%):  [30 %-40 %] 30 % Set Rate:  [16 bmp-18 bmp] 18 bmp Vt Set:  [620 mL] 620 mL PEEP:  [5 cmH20] 5 cmH20 Pressure Support:  [10 cmH20] 10 cmH20 Plateau Pressure:  [20 cmH20-22 cmH20] 22 cmH20   Intake/Output Summary (Last 24 hours) at 11-16-19 0907 Last data filed at 2019-11-16 0814 Gross per 24 hour  Intake 1991.61 ml  Output 1658 ml  Net 333.61 ml   Filed Weights   11/09/19 0406 11/10/19 0500 11/16/2019 0500  Weight: 128.5 kg 129.9 kg 131.4 kg    Examination:. Gen:      No acute distress, chronically ill-appearing HEENT:  EOMI, sclera anicteric Neck:  No masses; no thyromegaly Lungs:    Clear to auscultation bilaterally; normal respiratory effort CV:         Regular rate and rhythm; no murmurs Abd:      + bowel sounds; soft, non-tender; no palpable masses, no distension Ext:    No edema; adequate peripheral perfusion Skin:      Warm and dry; no rash Neuro: Somnolent, arousable, follows commands.  Labs significant for glucose 338, BUN/creatinine 104/3.5  Resolved Hospital Problem list    Shock  Assessment  & Plan:   Acute respiratory failure with hypoxia in setting of cardiac arrest requiring intubation  Hx OSA Possible CAP on CT, at risk for aspiration PNA following arrest CXR + for Pulmonary edema  P - Continue SBT's.  Will discuss with family about extubation. - Empiric Unasyn - Lasix for diuresis  VFib arrest Unclear if arrest etiology possibly r/t documented respiratory complaints prior vs primary cardiac etiology. Troponins are downtrending. P -Trend  CMP, Mg, Phos, trops, ckmb - appreciate cards assist>  TEE -- visualized clot in R atria  - Continue heparin drip - Metoprolol for rate control - Amiodarone ggt started, per cards - 12 lead ECG - not candidate for cath per cards 2/2 renal and GI issues - QTc bedside monitoring - Avoid QTc prolongation medications   Paroxysmal AF  Several episodes with afib with RVR that are minimally responsive to metoprolol. P - ICU monitoring  - heparin gtt - Metoprolol  - Amiodarone ggt as above  LAA thrombus seen on bedside TEE by cards on 5/28 P - Heparin gtt  NSTEMI CAD  Angina  P - cardiology following, not candidate for cath   HTN HLD P - holding antihypertensives in setting of shock  AKI on CKD IV Creatinine up today. Suspected prerenal AKI now with ATN component. Solitary kidney. P - nephrology following, no indication for RRT yet.   ABLA due to GI Bleed, s/p endoscopy with gastric polyp clipping GERD. GI following, stable Hb with no signs of frank bleeding. P - Trend CBC, goal hgb> 8  - Monitor for obvious bleeding  AAA 3 x 3.2cm aneurysm of distal abdominal aorta P -op follow up imaging   DM Hypoglycemic episodes, Currently on D10 P - Levemir 40u BID - Novolog 1-2-3 correction scale - Novolog 5u q4h TF coverage - CBGs  Hypothyroidism P -synthroid   Depression P -nortriptyline, zoloft, zyprexa, trazodone   Goals of care Discussed with wife at bedside.  She has made it clear that he would not  want to be supported like this.  Cecile had a great life owning cars, boats and if he cannot had the same quality of life then he would not want continued medical care. She has requested transition to comfort care.  Orders placed for terminal withdrawal once rest of family arrives.  Best practice:  Diet: TF Pain/Anxiety/Delirium protocol (if indicated): Prn fentanyl VAP protocol (if indicated): yes  DVT prophylaxis: heparin gtt GI prophylaxis: protonix  Glucose control: SSI + basal, TF coverage Mobility: BR Code Status: Full  Family Communication: Wife updated 6/2.  See above Disposition: ICU   Critical care time:       The patient is critically ill with multiple organ system failure and requires high complexity decision making for assessment and support, frequent evaluation and titration of therapies, advanced monitoring, review of radiographic studies and interpretation of complex data.   Critical Care Time devoted to patient care services, exclusive of separately billable procedures,  described in this note is 35 minutes.   Marshell Garfinkel MD [place] [facility] Please see [email or web address] for pager details.  December 06, 2019, 11:16 AM

## 2019-12-10 NOTE — Progress Notes (Addendum)
Inpatient Diabetes Program Recommendations  AACE/ADA: New Consensus Statement on Inpatient Glycemic Control (2015)  Target Ranges:  Prepandial:   less than 140 mg/dL      Peak postprandial:   less than 180 mg/dL (1-2 hours)      Critically ill patients:  140 - 180 mg/dL   Lab Results  Component Value Date   GLUCAP 338 (H) 12-07-2019   HGBA1C 6.6 (H) 10/10/2019    Review of Glycemic Control  Results for Matthew [name], Matthew [name] (MRN [id number]) as of 07-Dec-2019 09:36  Ref. Range 11/10/2019 03:35 11/10/2019 07:32 11/10/2019 11:27  Glucose-Capillary Latest Ref Range: 70 - 99 mg/dL 320 (H) 291 (H) 228 (H)   Results for Matthew [name], Matthew [name] (MRN [id number]) as of 12-07-19 09:36  Ref. Range 11/10/2019 11:27 11/10/2019 15:23 11/10/2019 19:37 11/10/2019 23:47 12-07-19 03:49 07-Dec-2019 07:25  Glucose-Capillary Latest Ref Range: 70 - 99 mg/dL 228 (H) 268 (H) 298 (H) 332 (H) 352 (H) 338 (H)   Diabetes history:  DM2  Outpatient Diabetes medications:  Levemir 50 units bid  Current orders for Inpatient glycemic control:  Levemir 25 units bid Novolog 0-15 units q4h  Novolog 3 units q4h Vital HP 71m/hr  Inpatient Diabetes Program Recommendations:     Please consider ICU glycemic control order set as cbg's are consistently elevated and patient is intubated.    -Novolog 1-2-3 correction scale (sensitive due to renal function) -Novolog 5 units q4h tube feed coverage (stop if feeds held or discontinued and/or cbg <80 mg/dl) -Levemir 40 units bid  Thank you, JReche Dixon RN, BSN Diabetes Coordinator Inpatient Diabetes Program [phone](team pager from 8a-5p)

## 2019-12-10 NOTE — Progress Notes (Signed)
[facility] Progress Note Patient Name: Matthew [name] DOB: 09-Mar-1946 MRN: [id number]   Date of Service  12/11/2019  HPI/Events of Note  AFIB with RVR - Ventricular rate = 120 - 130. BP = 129/72.  eICU Interventions  Will order: 1. Metoprolol 2.5 mg IV now.  2. Send AM labs now.      Intervention Category Major Interventions: Arrhythmia - evaluation and management  [name] Eugene Dec 11, 2019, 1:23 AM

## 2019-12-10 NOTE — Progress Notes (Addendum)
Cardiology paged about patients HR. Pt is currently afibb RVR with a HR ranging from 120-150s. Awaiting response from cardiology, e-link notified. Will continue to monitor.

## 2019-12-10 NOTE — Progress Notes (Signed)
Cardiology paged due to patient being in afibb RVR with a HR up to 160. Medications ordered. Will continue to monitor.

## 2019-12-10 NOTE — Progress Notes (Signed)
[facility] for heparin Indication: chest pain/ACS and atrial fibrillation  Allergies  Allergen Reactions  . Amiodarone Other (See Comments)    Caused lung and liver toxicity  . Tetanus Toxoids Hives  . Morphine And Related Other (See Comments)    "made him crazy and heart was racing"    Patient Measurements: Height: 6' (182.9 cm) Weight: 131.4 kg (289 lb 11 oz) IBW/kg (Calculated) : 77.6 Heparin Dosing Weight: 107 kg  Vital Signs: Temp: 99.9 F (37.7 C) (06/02 0727) Temp Source: Oral (06/02 0727) BP: 104/74 (06/02 1000) Pulse Rate: 70 (06/02 1007)  Labs: Recent Labs    11/08/19 1600 11/09/19 0219 11/09/19 0219 11/09/19 1058 11/09/19 2225 11/10/19 0325 11/10/19 0551 11-20-19 0227  HGB  --  9.0*   < >  --   --  9.0*  --  9.1*  HCT  --  30.0*  --   --   --  29.4*  --  30.3*  PLT  --  198  --   --   --  225  --  260  HEPARINUNFRC  --  0.35  --   --   --  0.32  --  0.30  CREATININE  --  3.07*   < >  --  3.77* 3.70*  --  3.50*  TROPONINIHS 11,652*  --   --  45,409*  --   --  10,784*  --    < > = values in this interval not displayed.    Estimated Creatinine Clearance: 25.6 mL/min (A) (by C-G formula based on SCr of 3.5 mg/dL (H)).  Assessment: 74 yo m with complicated hospital course, now with VFib arrest this am. Has had a GI bleed, NSTEMI, Code stroke called during stay.  Hx of apixaban PTA for afib  Had endoscopy on 5/24 with clipped polyps Bedside TEE shows left atrial appendage clot MRI brain ruled out CVA will increase HL goal back to 0.3-0.7  Heparin level remains therapeutic but at bottom of range 0.3. CBC stable. No bleeding issues reported.  Goal of Therapy:  Heparin level 0.3-0.7 units/ml  Monitor platelets by anticoagulation protocol: Yes   Plan:  Continue heparin at 1750 units/hr - will not increase with plan today for extubation and transition to comfort care Monitor daily heparin level and CBC, s/sx  bleeding   Arturo Morton, PharmD, BCPS Please check AMION for all [place] contact numbers Clinical Pharmacist 11/20/19 11:00 AM

## 2019-12-10 NOTE — Progress Notes (Signed)
Time of death called on December 09, 2019 at 22:01. No breath or heart sounds heard, confirmed by Lucky Rathke, RN and Laurena Slimmer, RN. Patients family at bedside.

## 2019-12-10 NOTE — Progress Notes (Addendum)
DAILY PROGRESS NOTE   Patient Name: Matthew [name] Date of Encounter: 05-Dec-2019 Cardiologist: Larae Grooms, MD  Chief Complaint   Intubated, lightly sedated  Patient Profile   Matthew [name] is a 74 y.o. male with a hx of coronary artery disease, CABG, hypertension, hyperlipidemia, paroxysmal atrial fibrillation, hypothyroidism, CKD who presents with chest pain and was found to have a  GI bleed and mildly elevated troponins .  Who is being seen today for the evaluation of  Elevated troponins  at the request of  Dr. Tamala Julian.  Subjective   Afib with RVR overnight, despite PRN metoprolol and increasing the standing dose of metoprolol per tube.  Objective   Vitals:   12-05-2019 0727 2019-12-05 0730 12/05/19 0800 2019-12-05 0808  BP:  116/70 109/72   Pulse:  86 98   Resp:  _0 Temp: 99.9 F (37.7 C)     TempSrc: Oral     SpO2:  98% 98% 98%  Weight:      Height:        Intake/Output Summary (Last 24 hours) at 12/05/19 0850 Last data filed at 2019/12/05 3428 Gross per 24 hour  Intake 2074.11 ml  Output 1658 ml  Net 416.11 ml   Filed Weights   11/09/19 0406 11/10/19 0500 12-05-2019 0500  Weight: 128.5 kg 129.9 kg 131.4 kg    Physical Exam   General appearance: intubated, lightly sedated vent, mitts in place Neck: no carotid bruit, no JVD and thyroid not enlarged, symmetric, no tenderness/mass/nodules Lungs: diminished breath sounds bilaterally Heart: irregularly irregular rhythm Abdomen: soft, non-tender; bowel sounds normal; no masses,  no organomegaly Extremities: extremities normal, atraumatic, no cyanosis or edema Pulses: 2+ and symmetric Skin: cool, dry Neurologic: Mental status: lightly sedated, opens eyes to voice Psych: Cannot assess  Inpatient Medications    Scheduled Meds: . sodium chloride   Intravenous Once  . chlorhexidine gluconate (MEDLINE KIT)  15 mL Mouth Rinse BID  . Chlorhexidine Gluconate Cloth  6 each Topical Daily  . clopidogrel   75 mg Per Tube Daily  . docusate  100 mg Per Tube BID  . feeding supplement (PRO-STAT SUGAR FREE 64)  30 mL Per Tube TID  . feeding supplement (VITAL HIGH PROTEIN)  1,000 mL Per Tube Q24H  . ferrous sulfate  300 mg Per Tube Q M,W,F  . fluticasone  2 spray Each Nare Daily  . furosemide  60 mg Intravenous Q6H  . insulin aspart  0-15 Units Subcutaneous Q4H  . insulin aspart  3 Units Subcutaneous Q4H  . insulin detemir  25 Units Subcutaneous BID  . levothyroxine  150 mcg Per Tube QAC breakfast  . mouth rinse  15 mL Mouth Rinse 10 times per day  . melatonin  3 mg Per Tube QHS  . metoprolol tartrate  25 mg Per Tube BID  . midodrine  10 mg Per Tube TID WC  . nortriptyline  50 mg Per Tube QHS  . pantoprazole sodium  40 mg Per Tube BID  . polyethylene glycol  17 g Per Tube Daily  . senna  2 tablet Per Tube q AM   And  . senna  1 tablet Per Tube QHS  . sertraline  25 mg Per Tube BH-q7a  . sodium bicarbonate  50 mEq Intravenous Once  . sodium chloride flush  3 mL Intravenous Q12H  . traZODone  100 mg Per Tube QHS    Continuous Infusions: . sodium chloride 10 mL/hr at 05-Dec-2019  0820  . fentaNYL infusion INTRAVENOUS 75 mcg/hr (11/24/19 0800)  . heparin 1,750 Units/hr (24-Nov-2019 0800)    PRN Meds: sodium chloride, acetaminophen, alum & mag hydroxide-simeth **AND** lidocaine, fentaNYL, HYDROmorphone (DILAUDID) injection, lidocaine, midazolam, nitroGLYCERIN, OLANZapine zydis, polyethylene glycol, simethicone, sodium chloride, triamcinolone cream   Labs   Results for orders placed or performed during the hospital encounter of 10/26/2019 (from the past 48 hour(s))  Troponin I (High Sensitivity)     Status: Abnormal   Collection Time: 11/09/19 10:58 AM  Result Value Ref Range   Troponin I (High Sensitivity) 13,823 (HH) <18 ng/L    Comment: CRITICAL VALUE NOTED.  VALUE IS CONSISTENT WITH PREVIOUSLY REPORTED AND CALLED VALUE. (NOTE) Elevated high sensitivity troponin I (hsTnI) values and  significant  changes across serial measurements may suggest ACS but many other  chronic and acute conditions are known to elevate hsTnI results.  Refer to the Links section for chest pain algorithms and additional  guidance. Performed at [facility], [place] [address] [place], [place] [address]   Glucose, capillary     Status: Abnormal   Collection Time: 11/09/19 11:47 AM  Result Value Ref Range   Glucose-Capillary 267 (H) 70 - 99 mg/dL    Comment: Glucose reference range applies only to samples taken after fasting for at least 8 hours.  Glucose, capillary     Status: Abnormal   Collection Time: 11/09/19  4:06 PM  Result Value Ref Range   Glucose-Capillary 161 (H) 70 - 99 mg/dL    Comment: Glucose reference range applies only to samples taken after fasting for at least 8 hours.  Phosphorus     Status: Abnormal   Collection Time: 11/09/19  4:18 PM  Result Value Ref Range   Phosphorus 5.3 (H) 2.5 - 4.6 mg/dL    Comment: Performed at [facility] [address], [place], [place] [address]  Magnesium     Status: Abnormal   Collection Time: 11/09/19  4:18 PM  Result Value Ref Range   Magnesium 2.5 (H) 1.7 - 2.4 mg/dL    Comment: Performed at [facility] [address], [place], [place] [address]  Glucose, capillary     Status: Abnormal   Collection Time: 11/09/19  7:26 PM  Result Value Ref Range   Glucose-Capillary 260 (H) 70 - 99 mg/dL    Comment: Glucose reference range applies only to samples taken after fasting for at least 8 hours.  Basic metabolic panel     Status: Abnormal   Collection Time: 11/09/19 10:25 PM  Result Value Ref Range   Sodium 134 (L) 135 - 145 mmol/L   Potassium 4.2 3.5 - 5.1 mmol/L   Chloride 102 98 - 111 mmol/L   CO2 22 22 - 32 mmol/L   Glucose, Bld 327 (H) 70 - 99 mg/dL    Comment: Glucose reference range applies only to samples taken after fasting for at least 8 hours.   BUN 83 (H) 8 - 23 mg/dL   Creatinine, Ser 3.77 (H) 0.61 - 1.24  mg/dL   Calcium 7.7 (L) 8.9 - 10.3 mg/dL   GFR calc non Af Amer 15 (L) >60 mL/min   GFR calc Af Amer 17 (L) >60 mL/min   Anion gap 10 5 - 15    Comment: Performed at [facility] [address] [place], [place] [address]  Glucose, capillary     Status: Abnormal   Collection Time: 11/09/19 11:35 PM  Result  Value Ref Range   Glucose-Capillary 307 (H) 70 - 99 mg/dL    Comment: Glucose reference range applies only to samples taken after fasting for at least 8 hours.  Heparin level (unfractionated)     Status: None   Collection Time: 11/10/19  3:25 AM  Result Value Ref Range   Heparin Unfractionated 0.32 0.30 - 0.70 IU/mL    Comment: (NOTE) If heparin results are below expected values, and patient dosage has  been confirmed, suggest follow up testing of antithrombin III levels. Performed at [facility], [place] [address], [place], [place] [address]   CBC     Status: Abnormal   Collection Time: 11/10/19  3:25 AM  Result Value Ref Range   WBC 13.1 (H) 4.0 - 10.5 K/uL   RBC 2.99 (L) 4.22 - 5.81 MIL/uL   Hemoglobin 9.0 (L) 13.0 - 17.0 g/dL   HCT 29.4 (L) 39.0 - 52.0 %   MCV 98.3 80.0 - 100.0 fL   MCH 30.1 26.0 - 34.0 pg   MCHC 30.6 30.0 - 36.0 g/dL   RDW 18.3 (H) 11.5 - 15.5 %   Platelets 225 150 - 400 K/uL   nRBC 0.3 (H) 0.0 - 0.2 %    Comment: Performed at [facility] [address], [place], [place] [address]  Magnesium     Status: None   Collection Time: 11/10/19  3:25 AM  Result Value Ref Range   Magnesium 2.2 1.7 - 2.4 mg/dL    Comment: Performed at [facility], [place] [address], [place], [place] [address]  Phosphorus     Status: Abnormal   Collection Time: 11/10/19  3:25 AM  Result Value Ref Range   Phosphorus 5.0 (H) 2.5 - 4.6 mg/dL    Comment: Performed at [facility] [address] [place], [place] [address]  Comprehensive metabolic panel     Status: Abnormal   Collection Time: 11/10/19  3:25 AM  Result Value Ref Range   Sodium 135  135 - 145 mmol/L   Potassium 4.5 3.5 - 5.1 mmol/L   Chloride 98 98 - 111 mmol/L   CO2 22 22 - 32 mmol/L   Glucose, Bld 327 (H) 70 - 99 mg/dL    Comment: Glucose reference range applies only to samples taken after fasting for at least 8 hours.   BUN 90 (H) 8 - 23 mg/dL   Creatinine, Ser 3.70 (H) 0.61 - 1.24 mg/dL   Calcium 8.0 (L) 8.9 - 10.3 mg/dL   Total Protein 6.2 (L) 6.5 - 8.1 g/dL   Albumin 2.3 (L) 3.5 - 5.0 g/dL   AST 38 15 - 41 U/L   ALT 213 (H) 0 - 44 U/L   Alkaline Phosphatase 47 38 - 126 U/L   Total Bilirubin 0.8 0.3 - 1.2 mg/dL   GFR calc non Af Amer 15 (L) >60 mL/min   GFR calc Af Amer 17 (L) >60 mL/min   Anion gap 15 5 - 15    Comment: Performed at [facility] [address] [place], [place] [address]  Brain natriuretic peptide     Status: Abnormal   Collection Time: 11/10/19  3:25 AM  Result Value Ref Range   B Natriuretic Peptide 1,590.0 (H) 0.0 - 100.0 pg/mL    Comment: Performed at [facility] [address] [place], [place] [address]  Glucose, capillary     Status: Abnormal   Collection Time: 11/10/19  3:35 AM  Result Value Ref Range   Glucose-Capillary 320 (H) 70 - 99 mg/dL    Comment: Glucose reference range applies only to samples taken after fasting for at least 8 hours.  Troponin I (High Sensitivity)     Status: Abnormal   Collection Time: 11/10/19  5:51 AM  Result Value Ref Range   Troponin I (High Sensitivity) 10,784 (HH) <18 ng/L    Comment: CRITICAL VALUE NOTED.  VALUE IS CONSISTENT WITH PREVIOUSLY REPORTED AND CALLED VALUE. (NOTE) Elevated high sensitivity troponin I (hsTnI) values and significant  changes across serial measurements may suggest ACS but many other  chronic and acute conditions are known to elevate hsTnI results.  Refer to the Links section for chest pain algorithms and additional  guidance. Performed at [facility], [place] [address], [place], [place] [address]   Glucose, capillary     Status: Abnormal    Collection Time: 11/10/19  7:32 AM  Result Value Ref Range   Glucose-Capillary 291 (H) 70 - 99 mg/dL    Comment: Glucose reference range applies only to samples taken after fasting for at least 8 hours.  Glucose, capillary     Status: Abnormal   Collection Time: 11/10/19 11:27 AM  Result Value Ref Range   Glucose-Capillary 228 (H) 70 - 99 mg/dL    Comment: Glucose reference range applies only to samples taken after fasting for at least 8 hours.  Glucose, capillary     Status: Abnormal   Collection Time: 11/10/19  3:23 PM  Result Value Ref Range   Glucose-Capillary 268 (H) 70 - 99 mg/dL    Comment: Glucose reference range applies only to samples taken after fasting for at least 8 hours.  Glucose, capillary     Status: Abnormal   Collection Time: 11/10/19  7:37 PM  Result Value Ref Range   Glucose-Capillary 298 (H) 70 - 99 mg/dL    Comment: Glucose reference range applies only to samples taken after fasting for at least 8 hours.   Comment 1 Notify RN   Glucose, capillary     Status: Abnormal   Collection Time: 11/10/19 11:47 PM  Result Value Ref Range   Glucose-Capillary 332 (H) 70 - 99 mg/dL    Comment: Glucose reference range applies only to samples taken after fasting for at least 8 hours.   Comment 1 Notify RN   Heparin level (unfractionated)     Status: None   Collection Time: 30-Nov-2019  2:27 AM  Result Value Ref Range   Heparin Unfractionated 0.30 0.30 - 0.70 IU/mL    Comment: (NOTE) If heparin results are below expected values, and patient dosage has  been confirmed, suggest follow up testing of antithrombin III levels. Performed at [facility], [place] [address] [place], [place] [address]   CBC     Status: Abnormal   Collection Time: 30-Nov-2019  2:27 AM  Result Value Ref Range   WBC 12.6 (H) 4.0 - 10.5 K/uL   RBC 3.10 (L) 4.22 - 5.81 MIL/uL   Hemoglobin 9.1 (L) 13.0 - 17.0 g/dL   HCT 30.3 (L) 39.0 - 52.0 %   MCV 97.7 80.0 - 100.0 fL   MCH 29.4 26.0 - 34.0 pg    MCHC 30.0 30.0 - 36.0 g/dL   RDW 18.0 (H) 11.5 - 15.5 %   Platelets 260 150 - 400 K/uL   nRBC 0.4 (H) 0.0 - 0.2 %    Comment: Performed at [facility],  [address], [place], [place] [address]  Basic metabolic panel     Status: Abnormal   Collection Time: 11/21/19  2:27 AM  Result Value Ref Range   Sodium 136 135 - 145 mmol/L   Potassium 4.6 3.5 - 5.1 mmol/L   Chloride 98 98 - 111 mmol/L   CO2 25 22 - 32 mmol/L   Glucose, Bld 355 (H) 70 - 99 mg/dL    Comment: Glucose reference range applies only to samples taken after fasting for at least 8 hours.   BUN 104 (H) 8 - 23 mg/dL   Creatinine, Ser 3.50 (H) 0.61 - 1.24 mg/dL   Calcium 8.4 (L) 8.9 - 10.3 mg/dL   GFR calc non Af Amer 16 (L) >60 mL/min   GFR calc Af Amer 19 (L) >60 mL/min   Anion gap 13 5 - 15    Comment: Performed at [facility] [address], [place], [place] [address]  Magnesium     Status: None   Collection Time: 21-Nov-2019  2:27 AM  Result Value Ref Range   Magnesium 2.3 1.7 - 2.4 mg/dL    Comment: Performed at [facility] [address], [place],  [address]  Phosphorus     Status: Abnormal   Collection Time: 11-21-2019  2:27 AM  Result Value Ref Range   Phosphorus 4.8 (H) 2.5 - 4.6 mg/dL    Comment: Performed at [facility] [address], [place], [place] [address]  Glucose, capillary     Status: Abnormal   Collection Time: November 21, 2019  3:49 AM  Result Value Ref Range   Glucose-Capillary 352 (H) 70 - 99 mg/dL    Comment: Glucose reference range applies only to samples taken after fasting for at least 8 hours.   Comment 1 Notify RN   Glucose, capillary     Status: Abnormal   Collection Time: 11-21-19  7:25 AM  Result Value Ref Range   Glucose-Capillary 338 (H) 70 - 99 mg/dL    Comment: Glucose reference range applies only to samples taken after fasting for at least 8 hours.    ECG   N/A  Telemetry   Afib with RVR - rates up to the 160's - personally reviewed  Radiology     DG Chest Port 1 View  Result Date: 11/10/2019 CLINICAL DATA:  Acute respiratory failure EXAM: PORTABLE CHEST 1 VIEW COMPARISON:  Nov 09, 2019 FINDINGS: There is mild cardiomegaly. Prominence of the central pulmonary vasculature is seen. Small bilateral pleural effusions are noted. ETT is 2 cm above level of the carina. NG tube is seen below the diaphragm. Overlying median sternotomy wires are present. IMPRESSION: Support lines and tubes in satisfactory position. Pulmonary vascular congestion and small bilateral pleural effusions. Electronically Signed   By: Prudencio Pair M.D.   On: 11/10/2019 05:36    Cardiac Studies   TEE Bedside (11/06/2018)  Verbal consent from wife Sedation Fentanyl and Precedex  Mild LVE EF 35-40% more diffuse hypokinesis slightly worse in inferior wall Moderate MR Moderate TR Moderate RV hypokinesis with mild dilatation Normal aorta with no dissection and minimal plaque No ASD/PFO No effusion  Large amount of LAA thrombus   Continue supportive care Lasix for CHF Amiodarone if needed for recurrent ventricular arrhythmia Start heparin for PAF and LAA thrombus  Vent management per CCM CXR does not look that bad in regard To CHF. ETT advance 4 cm before TEE  Wife has been updated at length  Jenkins Rouge MD [facility]  Assessment   Principal Problem:   Chest pain Active Problems:   Hypothyroidism   PAF (paroxysmal atrial fibrillation) (HCC)   CKD (chronic kidney disease), stage III   AAA (abdominal aortic aneurysm) (HCC)   Diabetic polyneuropathy associated with type 2 diabetes mellitus (HCC)   OSA (obstructive sleep apnea)   Prolonged QT interval   Depression   GI bleed   Acute blood loss anemia   Hyperlipidemia LDL goal <70   AKI (acute kidney injury) ([place])   Blood in stool   Stage 4 chronic kidney disease (HCC)   Acute renal failure superimposed on stage 4 chronic kidney disease (HCC)   NSTEMI (non-ST elevated myocardial infarction) ([place])    Acute lower UTI   Respiratory arrest ([place])   Acute respiratory failure ([place])   Plan   Afib with RVR, despite BB - start amiodarone gtts - he had been anticoagulated on heparin. Will need to monitor QTc- check 12 lead EKG today. Previous QTc was 436 msec on 5/31.  Time Spent Directly with Patient:  I have spent a total of 35 minutes with the patient reviewing hospital notes, telemetry, EKGs, labs and examining the patient as well as establishing an assessment and plan that was discussed personally with the patient.  > 50% of time was spent in direct patient care.  Length of Stay:  LOS: 18 days   Pixie Casino, MD, [facility], [place] Director of the Advanced Lipid Disorders &  Cardiovascular Risk Reduction Clinic Diplomate of the American Board of Clinical Lipidology Attending Cardiologist  Direct Dial: [phone]  Fax: [phone]  Website:  [email or web address]  21-Nov-2019, 8:50 AM

## 2019-12-10 NOTE — Procedures (Signed)
Extubation Procedure Note  Patient Details:   Name: KI CORBO DOB: [birth year] MRN: [id number]   Airway Documentation:  Airway 8 mm (Active)  Secured at (cm) 25 cm 11/30/19 1200  Measured From Lips 11-30-19 1200  Secured Location Right 11/30/19 1020  Secured By [employer] November 30, 2019 1200  Tube Holder Repositioned Yes 11-30-19 0808  Cuff Pressure (cm H2O) 26 cm H2O 11/10/19 1956  Site Condition Cool;Dry Nov 30, 2019 1200   Vent end date: (not recorded) Vent end time: (not recorded)   Evaluation  O2 sats: stable throughout Complications: No apparent complications Patient did tolerate procedure well. Bilateral Breath Sounds: Rhonchi, Diminished   Yes, pt could speak post extuabtion.  Pt extubated to room air per family's wishes and physician's order.  Pt's family at bedside during extubation.  Earney Navy 2019-11-30, 1:20 PM

## 2019-12-10 DEATH — deceased

## 2022-02-10 IMAGING — DX DG CHEST 1V PORT
1 series · 1 of 1 positions shown · non-contrast
Comparison: 10/13/2018

CLINICAL DATA: Chest pain, shortness of breath, dizziness

EXAM:
PORTABLE CHEST 1 VIEW

[chest]
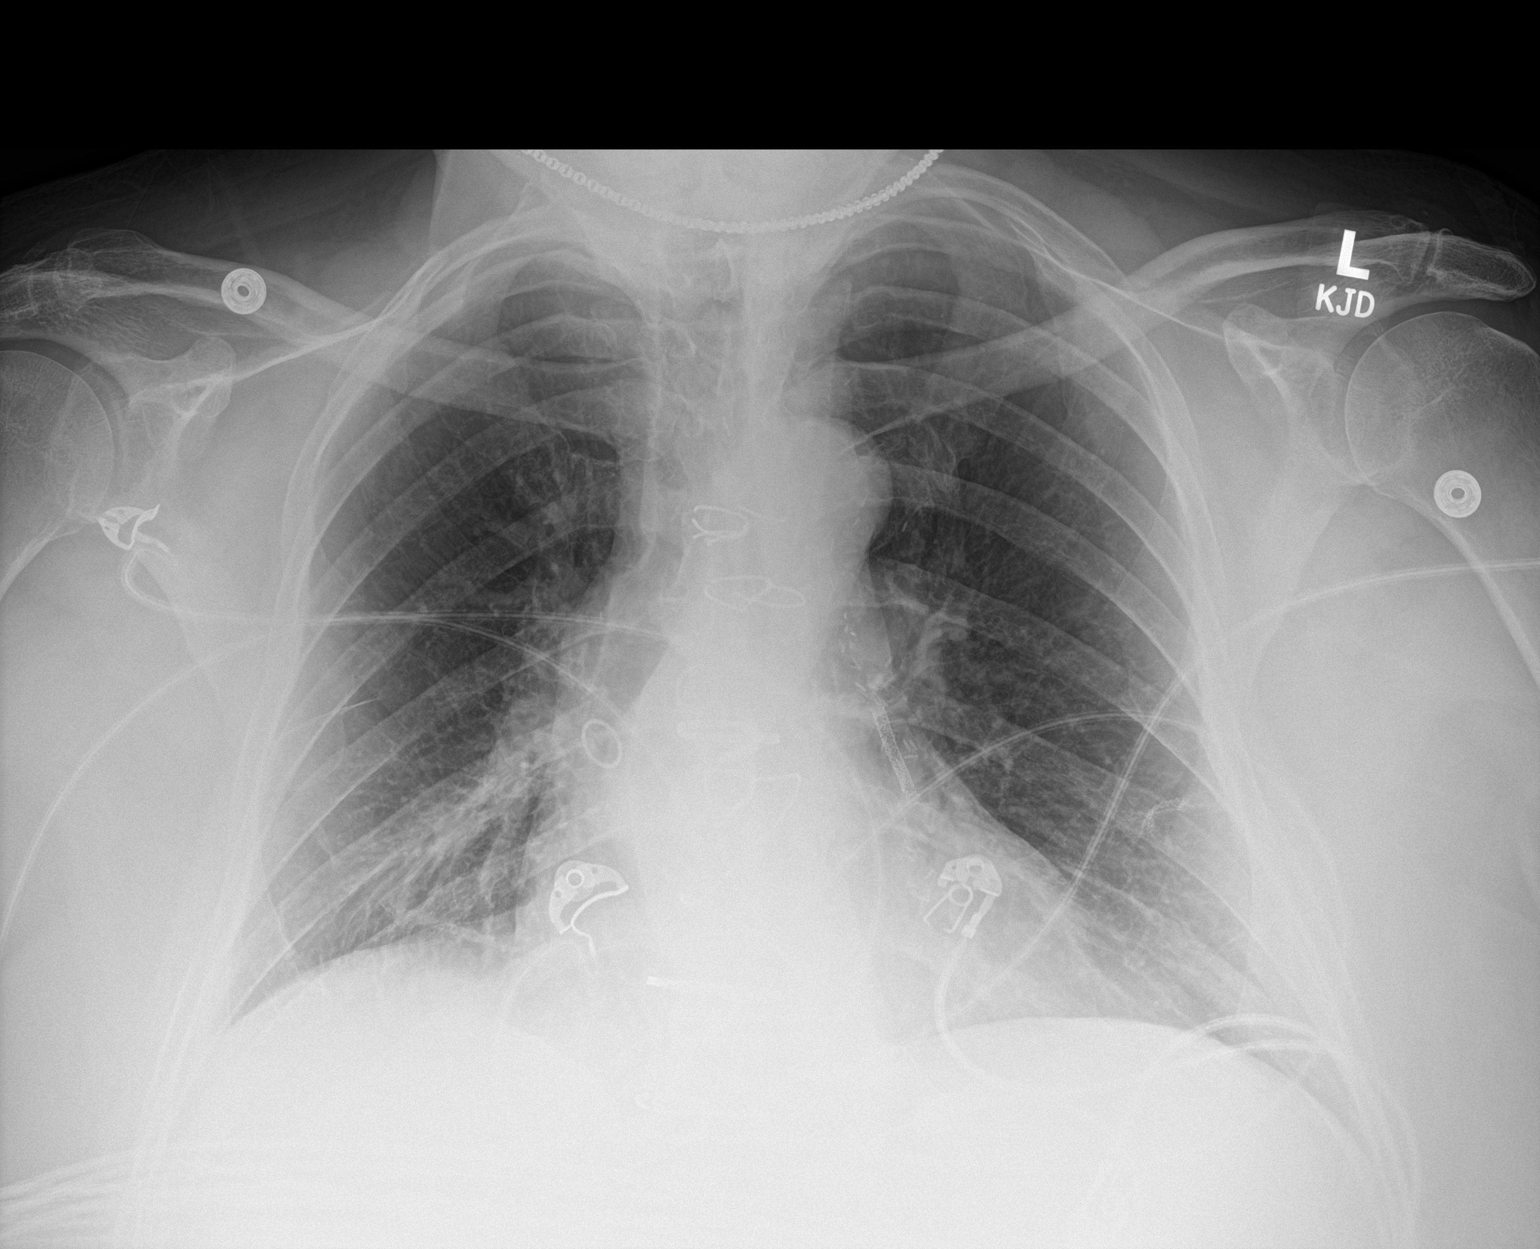

[1 of 1 positions shown; findings below may reference images not displayed]

FINDINGS: Stable right lower lobe/infrahilar scarring. Left lung is
essentially clear. No pleural effusion or pneumothorax.

Heart is normal in size. Postsurgical changes related to prior CABG.
Coronary stent.

Median sternotomy.
IMPRESSION: No evidence of acute cardiopulmonary disease.
